# Patient Record
Sex: Male | Born: 1950 | Race: White | Hispanic: No | Marital: Married | Smoking: Former smoker
Health system: Southern US, Community
[De-identification: ages and names within clinical notes are randomized; demographics above are authoritative.]

## PROBLEM LIST (undated history)

## (undated) ENCOUNTER — Emergency Department: Admission: EM | Payer: Federal, State, Local not specified - PPO

## (undated) DIAGNOSIS — F329 Major depressive disorder, single episode, unspecified: Secondary | ICD-10-CM

## (undated) DIAGNOSIS — Z951 Presence of aortocoronary bypass graft: Secondary | ICD-10-CM

## (undated) DIAGNOSIS — E119 Type 2 diabetes mellitus without complications: Secondary | ICD-10-CM

## (undated) DIAGNOSIS — R319 Hematuria, unspecified: Secondary | ICD-10-CM

## (undated) DIAGNOSIS — E872 Acidosis, unspecified: Secondary | ICD-10-CM

## (undated) DIAGNOSIS — K922 Gastrointestinal hemorrhage, unspecified: Secondary | ICD-10-CM

## (undated) DIAGNOSIS — I429 Cardiomyopathy, unspecified: Secondary | ICD-10-CM

## (undated) DIAGNOSIS — R011 Cardiac murmur, unspecified: Secondary | ICD-10-CM

## (undated) DIAGNOSIS — N184 Chronic kidney disease, stage 4 (severe): Secondary | ICD-10-CM

## (undated) DIAGNOSIS — I5022 Chronic systolic (congestive) heart failure: Secondary | ICD-10-CM

## (undated) DIAGNOSIS — C113 Malignant neoplasm of anterior wall of nasopharynx: Secondary | ICD-10-CM

## (undated) DIAGNOSIS — D509 Iron deficiency anemia, unspecified: Secondary | ICD-10-CM

## (undated) DIAGNOSIS — I2510 Atherosclerotic heart disease of native coronary artery without angina pectoris: Secondary | ICD-10-CM

## (undated) DIAGNOSIS — U071 COVID-19: Secondary | ICD-10-CM

## (undated) DIAGNOSIS — R790 Abnormal level of blood mineral: Secondary | ICD-10-CM

## (undated) DIAGNOSIS — E2740 Unspecified adrenocortical insufficiency: Secondary | ICD-10-CM

## (undated) DIAGNOSIS — Z923 Personal history of irradiation: Secondary | ICD-10-CM

## (undated) DIAGNOSIS — I10 Essential (primary) hypertension: Secondary | ICD-10-CM

## (undated) DIAGNOSIS — K2970 Gastritis, unspecified, without bleeding: Secondary | ICD-10-CM

## (undated) DIAGNOSIS — I739 Peripheral vascular disease, unspecified: Secondary | ICD-10-CM

## (undated) DIAGNOSIS — N400 Enlarged prostate without lower urinary tract symptoms: Secondary | ICD-10-CM

## (undated) DIAGNOSIS — K254 Chronic or unspecified gastric ulcer with hemorrhage: Secondary | ICD-10-CM

## (undated) DIAGNOSIS — I519 Heart disease, unspecified: Secondary | ICD-10-CM

## (undated) DIAGNOSIS — R569 Unspecified convulsions: Secondary | ICD-10-CM

## (undated) DIAGNOSIS — I219 Acute myocardial infarction, unspecified: Secondary | ICD-10-CM

## (undated) DIAGNOSIS — K2960 Other gastritis without bleeding: Secondary | ICD-10-CM

## (undated) DIAGNOSIS — C4490 Unspecified malignant neoplasm of skin, unspecified: Secondary | ICD-10-CM

## (undated) DIAGNOSIS — I469 Cardiac arrest, cause unspecified: Secondary | ICD-10-CM

## (undated) DIAGNOSIS — D649 Anemia, unspecified: Secondary | ICD-10-CM

## (undated) DIAGNOSIS — N189 Chronic kidney disease, unspecified: Secondary | ICD-10-CM

## (undated) DIAGNOSIS — I779 Disorder of arteries and arterioles, unspecified: Secondary | ICD-10-CM

## (undated) DIAGNOSIS — T82218A Other mechanical complication of coronary artery bypass graft, initial encounter: Secondary | ICD-10-CM

## (undated) DIAGNOSIS — I4891 Unspecified atrial fibrillation: Secondary | ICD-10-CM

## (undated) DIAGNOSIS — I4819 Other persistent atrial fibrillation: Secondary | ICD-10-CM

## (undated) DIAGNOSIS — A491 Streptococcal infection, unspecified site: Secondary | ICD-10-CM

## (undated) DIAGNOSIS — K219 Gastro-esophageal reflux disease without esophagitis: Secondary | ICD-10-CM

## (undated) HISTORY — DX: Atherosclerotic heart disease of native coronary artery without angina pectoris: I25.10

## (undated) HISTORY — DX: Other mechanical complication of coronary artery bypass graft, initial encounter: T82.218A

## (undated) HISTORY — DX: Type 2 diabetes mellitus without complications: E11.9

## (undated) HISTORY — DX: Gastrointestinal hemorrhage, unspecified: K92.2

## (undated) HISTORY — DX: Gastro-esophageal reflux disease without esophagitis: K21.9

## (undated) HISTORY — DX: Unspecified malignant neoplasm of skin, unspecified: C44.90

## (undated) HISTORY — DX: Chronic or unspecified gastric ulcer with hemorrhage: K25.4

## (undated) HISTORY — DX: Malignant neoplasm of anterior wall of nasopharynx: C11.3

## (undated) HISTORY — DX: Heart disease, unspecified: I51.9

## (undated) HISTORY — DX: Gastritis, unspecified, without bleeding: K29.70

## (undated) HISTORY — DX: Iron deficiency anemia, unspecified: D50.9

## (undated) HISTORY — DX: Personal history of irradiation: Z92.3

## (undated) HISTORY — PX: REVISION OF AORTA BIFEMORAL BYPASS: SHX6317

## (undated) HISTORY — DX: Chronic kidney disease, unspecified: N18.9

## (undated) HISTORY — DX: Abnormal level of blood mineral: R79.0

## (undated) HISTORY — DX: Other gastritis without bleeding: K29.60

## (undated) HISTORY — DX: Major depressive disorder, single episode, unspecified: F32.9

---
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
## 1994-07-30 HISTORY — PX: CORONARY ARTERY BYPASS GRAFT: SHX141

## 1994-12-29 DIAGNOSIS — Z951 Presence of aortocoronary bypass graft: Secondary | ICD-10-CM

## 1994-12-29 HISTORY — DX: Presence of aortocoronary bypass graft: Z95.1

## 1998-06-29 HISTORY — PX: FEMORAL-POPLITEAL BYPASS GRAFT: SHX937

## 1998-06-29 HISTORY — PX: OTHER SURGICAL HISTORY: SHX169

## 2004-07-30 HISTORY — PX: CAROTID ENDARTERECTOMY: SUR193

## 2008-05-30 DIAGNOSIS — C113 Malignant neoplasm of anterior wall of nasopharynx: Secondary | ICD-10-CM

## 2008-05-30 HISTORY — DX: Malignant neoplasm of anterior wall of nasopharynx: C11.3

## 2009-07-30 HISTORY — PX: OTHER SURGICAL HISTORY: SHX169

## 2009-08-30 HISTORY — PX: OTHER SURGICAL HISTORY: SHX169

## 2010-10-29 HISTORY — PX: OTHER SURGICAL HISTORY: SHX169

## 2011-02-28 HISTORY — PX: OTHER SURGICAL HISTORY: SHX169

## 2015-07-31 DIAGNOSIS — I219 Acute myocardial infarction, unspecified: Secondary | ICD-10-CM

## 2015-07-31 HISTORY — DX: Acute myocardial infarction, unspecified: I21.9

## 2017-01-27 DIAGNOSIS — K2960 Other gastritis without bleeding: Secondary | ICD-10-CM

## 2017-01-27 HISTORY — DX: Other gastritis without bleeding: K29.60

## 2018-05-30 DIAGNOSIS — Z923 Personal history of irradiation: Secondary | ICD-10-CM

## 2018-05-30 HISTORY — DX: Personal history of irradiation: Z92.3

## 2018-07-16 LAB — HM DIABETES FOOT EXAM: HM Diabetic Foot Exam: POSITIVE

## 2018-07-21 DIAGNOSIS — I25810 Atherosclerosis of coronary artery bypass graft(s) without angina pectoris: Secondary | ICD-10-CM | POA: Insufficient documentation

## 2018-07-21 DIAGNOSIS — I70229 Atherosclerosis of native arteries of extremities with rest pain, unspecified extremity: Secondary | ICD-10-CM

## 2018-07-21 DIAGNOSIS — I70219 Atherosclerosis of native arteries of extremities with intermittent claudication, unspecified extremity: Secondary | ICD-10-CM | POA: Insufficient documentation

## 2018-07-21 DIAGNOSIS — I10 Essential (primary) hypertension: Secondary | ICD-10-CM | POA: Insufficient documentation

## 2018-07-21 DIAGNOSIS — I6523 Occlusion and stenosis of bilateral carotid arteries: Secondary | ICD-10-CM | POA: Insufficient documentation

## 2018-09-02 DIAGNOSIS — I5022 Chronic systolic (congestive) heart failure: Secondary | ICD-10-CM | POA: Insufficient documentation

## 2018-09-03 DIAGNOSIS — F329 Major depressive disorder, single episode, unspecified: Secondary | ICD-10-CM | POA: Insufficient documentation

## 2018-09-03 DIAGNOSIS — N189 Chronic kidney disease, unspecified: Secondary | ICD-10-CM | POA: Insufficient documentation

## 2018-09-03 DIAGNOSIS — K2970 Gastritis, unspecified, without bleeding: Secondary | ICD-10-CM

## 2018-09-03 DIAGNOSIS — N184 Chronic kidney disease, stage 4 (severe): Secondary | ICD-10-CM | POA: Insufficient documentation

## 2018-09-03 DIAGNOSIS — K922 Gastrointestinal hemorrhage, unspecified: Secondary | ICD-10-CM

## 2018-09-03 DIAGNOSIS — K219 Gastro-esophageal reflux disease without esophagitis: Secondary | ICD-10-CM

## 2018-09-03 DIAGNOSIS — Z8711 Personal history of peptic ulcer disease: Secondary | ICD-10-CM | POA: Insufficient documentation

## 2018-09-03 DIAGNOSIS — K254 Chronic or unspecified gastric ulcer with hemorrhage: Secondary | ICD-10-CM

## 2018-09-03 DIAGNOSIS — R790 Abnormal level of blood mineral: Secondary | ICD-10-CM

## 2018-09-03 DIAGNOSIS — F32A Depression, unspecified: Secondary | ICD-10-CM

## 2018-09-03 DIAGNOSIS — Z9221 Personal history of antineoplastic chemotherapy: Secondary | ICD-10-CM | POA: Insufficient documentation

## 2018-09-03 DIAGNOSIS — Z8719 Personal history of other diseases of the digestive system: Secondary | ICD-10-CM | POA: Insufficient documentation

## 2018-09-03 DIAGNOSIS — D509 Iron deficiency anemia, unspecified: Secondary | ICD-10-CM

## 2018-09-03 HISTORY — DX: Gastritis, unspecified, without bleeding: K29.70

## 2018-09-03 HISTORY — DX: Gastro-esophageal reflux disease without esophagitis: K21.9

## 2018-09-03 HISTORY — DX: Chronic or unspecified gastric ulcer with hemorrhage: K25.4

## 2018-09-03 HISTORY — DX: Iron deficiency anemia, unspecified: D50.9

## 2018-09-03 HISTORY — DX: Depression, unspecified: F32.A

## 2018-09-03 HISTORY — DX: Gastrointestinal hemorrhage, unspecified: K92.2

## 2018-09-04 DIAGNOSIS — K2960 Other gastritis without bleeding: Secondary | ICD-10-CM | POA: Insufficient documentation

## 2018-09-04 DIAGNOSIS — E118 Type 2 diabetes mellitus with unspecified complications: Secondary | ICD-10-CM

## 2018-09-04 DIAGNOSIS — K529 Noninfective gastroenteritis and colitis, unspecified: Secondary | ICD-10-CM | POA: Insufficient documentation

## 2018-09-08 ENCOUNTER — Encounter: Payer: Self-pay | Admitting: Family Medicine

## 2018-09-08 ENCOUNTER — Other Ambulatory Visit: Payer: Self-pay

## 2018-09-08 ENCOUNTER — Ambulatory Visit (INDEPENDENT_AMBULATORY_CARE_PROVIDER_SITE_OTHER): Payer: Medicare Other | Admitting: Family Medicine

## 2018-09-08 VITALS — BP 110/60 | HR 65 | Temp 97.7°F | Resp 16 | Ht 61.5 in | Wt 224.4 lb

## 2018-09-08 DIAGNOSIS — N184 Chronic kidney disease, stage 4 (severe): Secondary | ICD-10-CM | POA: Diagnosis not present

## 2018-09-08 DIAGNOSIS — I10 Essential (primary) hypertension: Secondary | ICD-10-CM | POA: Diagnosis not present

## 2018-09-08 DIAGNOSIS — D692 Other nonthrombocytopenic purpura: Secondary | ICD-10-CM

## 2018-09-08 DIAGNOSIS — K2960 Other gastritis without bleeding: Secondary | ICD-10-CM | POA: Diagnosis not present

## 2018-09-08 DIAGNOSIS — K219 Gastro-esophageal reflux disease without esophagitis: Secondary | ICD-10-CM

## 2018-09-08 DIAGNOSIS — E1122 Type 2 diabetes mellitus with diabetic chronic kidney disease: Secondary | ICD-10-CM

## 2018-09-08 DIAGNOSIS — Z1159 Encounter for screening for other viral diseases: Secondary | ICD-10-CM

## 2018-09-08 DIAGNOSIS — E785 Hyperlipidemia, unspecified: Secondary | ICD-10-CM

## 2018-09-08 DIAGNOSIS — Z794 Long term (current) use of insulin: Secondary | ICD-10-CM

## 2018-09-08 DIAGNOSIS — I5022 Chronic systolic (congestive) heart failure: Secondary | ICD-10-CM

## 2018-09-08 DIAGNOSIS — I70219 Atherosclerosis of native arteries of extremities with intermittent claudication, unspecified extremity: Secondary | ICD-10-CM

## 2018-09-08 DIAGNOSIS — I25118 Atherosclerotic heart disease of native coronary artery with other forms of angina pectoris: Secondary | ICD-10-CM

## 2018-09-08 DIAGNOSIS — K529 Noninfective gastroenteritis and colitis, unspecified: Secondary | ICD-10-CM

## 2018-09-08 LAB — HM HEPATITIS C SCREENING LAB: HM Hepatitis Screen: NEGATIVE
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
## 2018-09-09 LAB — COMPLETE METABOLIC PANEL WITH GFR
AG Ratio: 1.6 (calc) (ref 1.0–2.5)
ALT: 19 U/L (ref 9–46)
AST: 23 U/L (ref 10–35)
Albumin: 4.2 g/dL (ref 3.6–5.1)
Alkaline phosphatase (APISO): 55 U/L (ref 35–144)
BUN/Creatinine Ratio: 15 (calc) (ref 6–22)
BUN: 45 mg/dL — AB (ref 7–25)
CO2: 19 mmol/L — AB (ref 20–32)
CREATININE: 2.93 mg/dL — AB (ref 0.70–1.25)
Calcium: 9.2 mg/dL (ref 8.6–10.3)
Chloride: 110 mmol/L (ref 98–110)
GFR, Est African American: 25 mL/min/{1.73_m2} — ABNORMAL LOW (ref >=60)
GFR, Est Non African American: 21 mL/min/{1.73_m2} — ABNORMAL LOW (ref >=60)
GLUCOSE: 94 mg/dL (ref 65–99)
Globulin: 2.6 g/dL (calc) (ref 1.9–3.7)
Potassium: 4.8 mmol/L (ref 3.5–5.3)
Sodium: 140 mmol/L (ref 135–146)
Total Bilirubin: 0.5 mg/dL (ref 0.2–1.2)
Total Protein: 6.8 g/dL (ref 6.1–8.1)

## 2018-09-09 LAB — LIPID PANEL
Cholesterol: 120 mg/dL (ref <200)
HDL: 35 mg/dL — ABNORMAL LOW (ref >=40)
LDL Cholesterol (Calc): 64 mg/dL (calc)
NON-HDL CHOLESTEROL (CALC): 85 mg/dL (ref <130)
Total CHOL/HDL Ratio: 3.4 (calc) (ref <5.0)
Triglycerides: 128 mg/dL (ref <150)

## 2018-09-09 LAB — HEMOGLOBIN A1C
Hgb A1c MFr Bld: 6.3 % of total Hgb — ABNORMAL HIGH (ref <5.7)
Mean Plasma Glucose: 134 (calc)
eAG (mmol/L): 7.4 (calc)

## 2018-09-09 LAB — HEPATITIS C ANTIBODY
Hepatitis C Ab: NONREACTIVE
SIGNAL TO CUT-OFF: 0.33 (ref <1.00)

## 2018-09-09 NOTE — Addendum Note (Signed)
Addended by: Loistine Chance on: 09/09/2018 09:45 AM   Modules accepted: Level of Service

## 2018-09-10 ENCOUNTER — Encounter: Payer: Self-pay | Admitting: Family Medicine

## 2018-09-13 DIAGNOSIS — E782 Mixed hyperlipidemia: Secondary | ICD-10-CM | POA: Insufficient documentation

## 2018-09-13 DIAGNOSIS — E1122 Type 2 diabetes mellitus with diabetic chronic kidney disease: Secondary | ICD-10-CM | POA: Insufficient documentation

## 2018-09-13 DIAGNOSIS — Z794 Long term (current) use of insulin: Secondary | ICD-10-CM | POA: Insufficient documentation

## 2018-09-29 ENCOUNTER — Encounter (INDEPENDENT_AMBULATORY_CARE_PROVIDER_SITE_OTHER): Payer: Self-pay | Admitting: Vascular Surgery

## 2018-09-29 ENCOUNTER — Ambulatory Visit (INDEPENDENT_AMBULATORY_CARE_PROVIDER_SITE_OTHER): Payer: Medicare Other

## 2018-09-29 ENCOUNTER — Other Ambulatory Visit: Payer: Self-pay

## 2018-09-29 ENCOUNTER — Ambulatory Visit (INDEPENDENT_AMBULATORY_CARE_PROVIDER_SITE_OTHER): Payer: Medicare Other | Admitting: Vascular Surgery

## 2018-09-29 ENCOUNTER — Other Ambulatory Visit (INDEPENDENT_AMBULATORY_CARE_PROVIDER_SITE_OTHER): Payer: Self-pay | Admitting: Vascular Surgery

## 2018-09-29 VITALS — BP 148/65 | HR 56 | Resp 12 | Ht 72.0 in | Wt 226.0 lb

## 2018-09-29 DIAGNOSIS — I70219 Atherosclerosis of native arteries of extremities with intermittent claudication, unspecified extremity: Secondary | ICD-10-CM

## 2018-09-29 DIAGNOSIS — I10 Essential (primary) hypertension: Secondary | ICD-10-CM

## 2018-09-29 DIAGNOSIS — I6523 Occlusion and stenosis of bilateral carotid arteries: Secondary | ICD-10-CM | POA: Diagnosis not present

## 2018-09-29 DIAGNOSIS — Z79899 Other long term (current) drug therapy: Secondary | ICD-10-CM

## 2018-09-29 DIAGNOSIS — I25708 Atherosclerosis of coronary artery bypass graft(s), unspecified, with other forms of angina pectoris: Secondary | ICD-10-CM

## 2018-09-29 DIAGNOSIS — Z7902 Long term (current) use of antithrombotics/antiplatelets: Secondary | ICD-10-CM

## 2018-09-29 DIAGNOSIS — Z87891 Personal history of nicotine dependence: Secondary | ICD-10-CM

## 2018-09-29 DIAGNOSIS — E118 Type 2 diabetes mellitus with unspecified complications: Secondary | ICD-10-CM

## 2018-10-04 ENCOUNTER — Encounter (INDEPENDENT_AMBULATORY_CARE_PROVIDER_SITE_OTHER): Payer: Self-pay | Admitting: Vascular Surgery

## 2018-10-04 NOTE — Progress Notes (Signed)
MRN : [id number]  Dylan Goodman is a 68 y.o. (1951/02/24) male who presents with chief complaint of  Chief Complaint  Patient presents with  . New Patient (Initial Visit)  .  History of Present Illness:    The patient is seen for evaluation of painful lower extremities and diminished pulses. Patient notes the pain is always associated with activity and is very consistent day today. Typically, the pain occurs at less than one block, progress is as activity continues to the point that the patient must stop walking. Resting including standing still for several minutes allowed resumption of the activity and the ability to walk a similar distance before stopping again. Uneven terrain and inclined shorten the distance. The pain has been progressive over the past several years. The patient states the inability to walk is now having a profound negative impact on quality of life and daily activities.  He has had numerous past interventions and surgeries.  He is remotely s/p an aorta bi fem patency unknown.  He has had a right fem pop first in 06/1998 with two revisions in 11/2001 and 08/2003 currently thrombosed also a revision with intervention and stenting in 08/2013 (right groin subsequently became infected).  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  Left leg stent placed in 08/2009 now thrombosed  The patient denies rest pain or dangling of an extremity off the side of the bed during the night for relief. No open wounds or sores at this time.  The patient denies amaurosis fugax or recent TIA symptoms. There are no recent neurological changes noted.  Recent carotid duplex shows RICA 03% and LICA is occluded  The patient denies changes in claudication symptoms or new rest pain symptoms.  No new ulcers or wounds of the foot.  The patient's blood pressure has been stable and relatively well controlled.  The patient denies history of DVT, PE or superficial  thrombophlebitis. The patient denies recent episodes of angina or shortness of breath.   Current Meds  Medication Sig  . carvedilol (COREG) 6.25 MG tablet Take 1 tablet by mouth 2 (two) times daily.  . cilostazol (PLETAL) 100 MG tablet Take 100 mg by mouth daily.  . Continuous Blood Gluc Sensor (FREESTYLE LIBRE 14 DAY SENSOR) MISC APP 1 SENSOR EVERY 14 DAYS  . diphenoxylate-atropine (LOMOTIL) 2.5-0.025 MG tablet Take 1 tablet by mouth daily.   Marland Kitchen ELIQUIS 2.5 MG TABS tablet Take 2.5 mg by mouth 2 (two) times daily. Once in the am and once in the pm.  . ezetimibe (ZETIA) 10 MG tablet Take 10 mg by mouth daily.   . fenofibrate 160 MG tablet Take by mouth.  Marland Kitchen glipiZIDE (GLUCOTROL) 5 MG tablet Take 2.5 mg by mouth daily before breakfast.   . Insulin Degludec (TRESIBA FLEXTOUCH) 200 UNIT/ML SOPN Inject into the skin.  . Insulin Pen Needle (BD PEN NEEDLE NANO U/F) 32G X 4 MM MISC U ONCE D UTD  . ISOSORBIDE MONONITRATE PO Take 90 mg by mouth daily.   . lansoprazole (PREVACID) 30 MG capsule Take 30 mg by mouth daily at 12 noon.   Marland Kitchen losartan (COZAAR) 25 MG tablet TK 1 T PO QD  . lovastatin (MEVACOR) 40 MG tablet Take 40 mg by mouth. Once in the am and once in the pm.  . nitroGLYCERIN (NITROSTAT) 0.4 MG SL tablet DIS 1 T UNT PRF 30 DAYS. MAX 3 DOSES IN 15 MINUTES  . ticagrelor (BRILINTA) 60 MG TABS  tablet Take 60 mg by mouth 2 (two) times daily.     Past Medical History:  Diagnosis Date  . Bleeding gastrointestinal 09/03/2018  . 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 Chronic depression 09/03/2018  . CKD (chronic kidney disease) 09/03/2018  . Coronary atherosclerosis 09/03/2018  . Coronary bypass graft mechanical complication 90/2409  . Diabetes mellitus without complication ([place])   . Gastric ulcer with hemorrhage 09/03/2018  . Gastritis 09/03/2018  . GERD (gastroesophageal reflux disease) 09/03/2018  . Heart disease   . IDA (iron deficiency  anemia) 09/03/2018  . Low magnesium levels 09/03/2018  . 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 Personal history of radiation therapy 05/30/2018   39 treatments with chemotherapy nasopharyngeal cancer  . Skin cancer     Past Surgical History:  Procedure Laterality Date  . CORONARY ARTERY BYPASS GRAFT    . heart stent  10/2010  . 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  . left leg stents  08/2009  . REVISION OF AORTA BIFEMORAL BYPASS  08/2003  . right femo pop bypass  06/1998   11/2001, 08/2003  . right leg stent  07/2009    Social History Social History   Tobacco Use  . Smoking status: Former Smoker    Packs/day: 1.50    Years: 50.00    Pack years: 75.00    Types: Cigarettes    Last attempt to quit: 02/18/2017    Years since quitting: 1.6  . Smokeless tobacco: Never Used  Substance Use Topics  . Alcohol use: Not Currently  . Drug use: Never    Family History Family History  Problem Relation Age of Onset  . Coronary artery disease Mother   . Alcohol abuse Father   . Colon cancer Father   . Coronary artery disease Father   . Kidney cancer Father   . Breast cancer Sister   No family history of bleeding/clotting disorders, porphyria or autoimmune disease   No Known Allergies   REVIEW OF SYSTEMS (Negative unless checked)  Constitutional: [] Weight loss  [] Fever  [] Chills Cardiac: [x] Chest pain   [] Chest pressure   [] Palpitations   [] Shortness of breath when laying flat   [x] Shortness of breath with exertion. Vascular:  [x] Pain in legs with walking   [x] Pain in legs at rest  [] History of DVT   [] Phlebitis   [] Swelling in legs   [] Varicose veins   [] Non-healing ulcers Pulmonary:   [] Uses home oxygen   [] Productive cough   [] Hemoptysis   [] Wheeze  [x] COPD   [] Asthma Neurologic:  [] Dizziness   [] Seizures   [x] History of stroke   [] History of TIA  [] Aphasia   [] Vissual changes   [] Weakness or numbness in arm   [] Weakness or numbness in leg Musculoskeletal:   [] Joint swelling   [] Joint pain    [] Low back pain Hematologic:  [x] Easy bruising  [] Easy bleeding   [] Hypercoagulable state   [] Anemic Gastrointestinal:  [] Diarrhea   [] Vomiting  [] Gastroesophageal reflux/heartburn   [] Difficulty swallowing. Genitourinary:  [] Chronic kidney disease   [] Difficult urination  [] Frequent urination   [] Blood in urine Skin:  [] Rashes   [] Ulcers  Psychological:  [] History of anxiety   []  History of major depression.  Physical Examination  Vitals:   09/29/18 1359  BP: (!) 148/65  Pulse: (!) 56  Resp: 12  Weight: 226 lb (102.5 kg)  Height: 6' (1.829 m)   Body mass index is 30.65 kg/m. Gen: WD/WN, NAD Head: [place]/AT, No temporalis wasting.  Ear/Nose/Throat:  Hearing grossly intact, nares w/o erythema or drainage, poor dentition Eyes: PER, EOMI, sclera nonicteric.  Neck: Supple, no masses.  No bruit or JVD.  Pulmonary:  Good air movement, clear to auscultation bilaterally, no use of accessory muscles.  Cardiac: RRR, normal S1, S2, no Murmurs. Vascular:  Multiple incisional scars both legs and midline abdomen bilateral carotid bruits Vessel Right Left  Radial Palpable Palpable  Brachial Palpable Palpable  Carotid Palpable Palpable  PT Not Palpable Not Palpable  DP Not Palpable Not Palpable  Gastrointestinal: soft, non-distended. No guarding/no peritoneal signs.  Musculoskeletal: M/S 5/5 throughout.  No deformity or atrophy.  Neurologic: CN 2-12 intact. Pain and light touch intact in extremities.  Symmetrical.  Speech is fluent. Motor exam as listed above. Psychiatric: Judgment intact, Mood & affect appropriate for pt's clinical situation. Dermatologic: No rashes or ulcers noted.  No changes consistent with cellulitis. Lymph : No Cervical lymphadenopathy, no lichenification or skin changes of chronic lymphedema.  CBC No results found for: WBC, HGB, HCT, MCV, PLT  BMET    Component Value Date/Time   NA 140 09/08/2018 1203   K 4.8 09/08/2018 1203   CL 110 09/08/2018 1203   CO2 19 (L)  09/08/2018 1203   GLUCOSE 94 09/08/2018 1203   BUN 45 (H) 09/08/2018 1203   CREATININE 2.93 (H) 09/08/2018 1203   CALCIUM 9.2 09/08/2018 1203   GFRNONAA 21 (L) 09/08/2018 1203   GFRAA 25 (L) 09/08/2018 1203   CrCl cannot be calculated (Patient's most recent lab result is older than the maximum 21 days allowed.).  COAG No results found for: INR, PROTIME  Radiology Vas [place] Abi With/wo Tbi  Result Date: 09/29/2018 LOWER EXTREMITY DOPPLER STUDY Indications: Claudication.  Vascular Interventions: Patient states H/O aorto-bi-femoral BPG, right leg BPG                         and multiple stents. Performing Technologist: Blondell Reveal RT, RDMS, RVT  Examination Guidelines: A complete evaluation includes at minimum, Doppler waveform signals and systolic blood pressure reading at the level of bilateral brachial, anterior tibial, and posterior tibial arteries, when vessel segments are accessible. Bilateral testing is considered an integral part of a complete examination. Photoelectric Plethysmograph (PPG) waveforms and toe systolic pressure readings are included as required and additional duplex testing as needed. Limited examinations for reoccurring indications may be performed as noted.  ABI Findings: +---------+------------------+-----+----------+--------+ Right    Rt Pressure (mmHg)IndexWaveform  Comment  +---------+------------------+-----+----------+--------+ Brachial 135                                       +---------+------------------+-----+----------+--------+ CFA                             biphasic           +---------+------------------+-----+----------+--------+ ATA      76                0.54 monophasic         +---------+------------------+-----+----------+--------+ PTA      76                0.54 monophasic         +---------+------------------+-----+----------+--------+ Great Toe56                0.40 Abnormal            +---------+------------------+-----+----------+--------+ +---------+------------------+-----+----------+-------+  Left     Lt Pressure (mmHg)IndexWaveform  Comment +---------+------------------+-----+----------+-------+ Brachial 140                                      +---------+------------------+-----+----------+-------+ ATA      60                0.43 monophasic        +---------+------------------+-----+----------+-------+ PTA      54                0.39 monophasic        +---------+------------------+-----+----------+-------+ Great Toe48                0.34 Abnormal          +---------+------------------+-----+----------+-------+ LIMITED DUPLEX (based on limited surgical history): Dampened biphasic flow in the bilateral common femoral arteries. No flow was adequately detected in the bilateral superficial femoral or popliteal arteries with prominent collateral flow. No flow was detected in what appears to be a right leg femoral BPG  as well. No previous ABI available for comparison.  Summary: Right: Resting right ankle-brachial index indicates moderate right lower extremity arterial disease. The right toe-brachial index is abnormal. Left: Resting left ankle-brachial index indicates severe left lower extremity arterial disease. The left toe-brachial index is abnormal. Bilateral: Based on limited duplex, there appears to be extensive occlusive disease in the bilateral femoral-popliteal arterial systems, as described above.  *See table(s) above for measurements and observations.  Electronically signed by Hortencia Pilar MD on 09/29/2018 at 4:51:44 PM.   Final      Assessment/Plan 1. Atherosclerotic peripheral vascular disease with intermittent claudication (HCC) Recommend:  Patient should undergo arterial duplex of the lower extremity ASAP because there has been a significant deterioration in the patient's lower extremity symptoms.  The patient states they are having increased  pain and a marked decrease in the distance that they can walk.  The risks and benefits as well as the alternatives were discussed in detail with the patient.  All questions were answered.  Patient agrees to proceed and understands this could be a prelude to angiography and intervention.  The patient will follow up with me in the office to review the studies.  - VAS US AORTA/IVC/ILIACS; Future  2. Bilateral carotid artery stenosis Recommend:  Given the patient's asymptomatic subcritical stenosis no further invasive testing or surgery at this time.  Duplex ultrasound shows RICA 52% and LICA is occluded  Continue antiplatelet therapy as prescribed Continue management of CAD, HTN and Hyperlipidemia Healthy heart diet,  encouraged exercise at least 4 times per week Follow up in 6 months with duplex ultrasound and physical exam   3. 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  Continue statin as ordered and reviewed, no changes at this time  Nitrates PRN for chest pain   4. Benign essential HTN Continue antihypertensive medications as already ordered, these medications have been reviewed and there are no changes at this time.   5. Diabetes mellitus type 2 with complications ([place]) Continue hypoglycemic medications as already ordered, these medications have been reviewed and there are no changes at this time.  Hgb A1C to be monitored as already arranged by primary service     Hortencia Pilar, MD  10/04/2018 11:03 AM

## 2018-10-08 ENCOUNTER — Encounter (INDEPENDENT_AMBULATORY_CARE_PROVIDER_SITE_OTHER): Payer: Self-pay

## 2018-10-08 ENCOUNTER — Other Ambulatory Visit: Payer: Self-pay

## 2018-10-08 ENCOUNTER — Encounter (INDEPENDENT_AMBULATORY_CARE_PROVIDER_SITE_OTHER): Payer: Self-pay | Admitting: Nurse Practitioner

## 2018-10-08 ENCOUNTER — Telehealth (INDEPENDENT_AMBULATORY_CARE_PROVIDER_SITE_OTHER): Payer: Self-pay

## 2018-10-08 ENCOUNTER — Ambulatory Visit (INDEPENDENT_AMBULATORY_CARE_PROVIDER_SITE_OTHER): Payer: Medicare Other | Admitting: Nurse Practitioner

## 2018-10-08 ENCOUNTER — Ambulatory Visit (INDEPENDENT_AMBULATORY_CARE_PROVIDER_SITE_OTHER): Payer: Medicare Other

## 2018-10-08 VITALS — BP 132/65 | HR 64 | Resp 12 | Ht 72.0 in | Wt 223.0 lb

## 2018-10-08 DIAGNOSIS — Z7902 Long term (current) use of antithrombotics/antiplatelets: Secondary | ICD-10-CM

## 2018-10-08 DIAGNOSIS — I70212 Atherosclerosis of native arteries of extremities with intermittent claudication, left leg: Secondary | ICD-10-CM | POA: Diagnosis not present

## 2018-10-08 DIAGNOSIS — I70219 Atherosclerosis of native arteries of extremities with intermittent claudication, unspecified extremity: Secondary | ICD-10-CM

## 2018-10-08 DIAGNOSIS — E118 Type 2 diabetes mellitus with unspecified complications: Secondary | ICD-10-CM | POA: Diagnosis not present

## 2018-10-08 DIAGNOSIS — I25708 Atherosclerosis of coronary artery bypass graft(s), unspecified, with other forms of angina pectoris: Secondary | ICD-10-CM | POA: Diagnosis not present

## 2018-10-08 DIAGNOSIS — Z794 Long term (current) use of insulin: Secondary | ICD-10-CM

## 2018-10-08 DIAGNOSIS — K219 Gastro-esophageal reflux disease without esophagitis: Secondary | ICD-10-CM | POA: Diagnosis not present

## 2018-10-08 DIAGNOSIS — Z79899 Other long term (current) drug therapy: Secondary | ICD-10-CM

## 2018-10-08 DIAGNOSIS — Z87891 Personal history of nicotine dependence: Secondary | ICD-10-CM

## 2018-10-08 NOTE — Telephone Encounter (Signed)
Patient was seen in our office today and given his pre-procedure instructions for his angio with Dr. Delana Meyer on 11/11/2018 with an arrival time of 6:45 am, patient's pre-op is on 11/10/2018 @ 9:00 am. This information was given to the patient in office and I called the patient to give him his pre-op day and time.

## 2018-10-08 NOTE — Progress Notes (Signed)
SUBJECTIVE:  Patient ID: Dylan Goodman, male    DOB: January 30, 1951, 68 y.o.   MRN: [id number] No chief complaint on file.   HPI  Dylan Goodman is a 68 y.o. male The patient returns to the office for followup and review of the noninvasive studies. There has been a significant deterioration in the lower extremity symptoms.  The patient notes interval shortening of their claudication distance and development of mild rest pain symptoms. No new ulcers or wounds have occurred since the last visit.  There have been no significant changes to the patient's overall health care.  The patient denies amaurosis fugax or recent TIA symptoms. There are no recent neurological changes noted. The patient denies history of DVT, PE or superficial thrombophlebitis. The patient denies recent episodes of angina or shortness of breath.   The patient has an extensive history of peripheral arterial disease.  He notes having his first femoropopliteal bypass in 68.  He had a revision of his femoropopliteal in 2003 as well as in 2005.  He has had multiple stents placed within his lower extremities as well as her aortobifem.  He also had his aortobifem in 2005.  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  The right leg has predominantly monophasic throughout.  Past Medical History:  Diagnosis Date  . Bleeding gastrointestinal 09/03/2018  . 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 Chronic depression 09/03/2018  . CKD (chronic kidney disease) 09/03/2018  . Coronary atherosclerosis 09/03/2018  . Coronary bypass graft mechanical complication 83/1517  . Diabetes mellitus without complication ([place])   . Gastric ulcer with hemorrhage 09/03/2018  . Gastritis 09/03/2018   . GERD (gastroesophageal reflux disease) 09/03/2018  . Heart disease   . IDA (iron deficiency anemia) 09/03/2018  . Low magnesium levels 09/03/2018  . 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 Personal history of radiation therapy 05/30/2018   39 treatments with chemotherapy nasopharyngeal cancer  . Skin cancer     Past Surgical History:  Procedure Laterality Date  . CORONARY ARTERY BYPASS GRAFT    . heart stent  10/2010  . 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  . left leg stents  08/2009  . REVISION OF AORTA BIFEMORAL BYPASS  08/2003  . right femo pop bypass  06/1998   11/2001, 08/2003  . right leg stent  07/2009    Social History   Socioeconomic History  . Marital status: Married    Spouse name: Not on file  . Number of children: 1  . Years of education: Not on file  . Highest education level: 11th grade  Occupational History  . Occupation: retired   Scientific laboratory technician  . Financial resource strain: Not hard at all  . Food insecurity:    Worry: Never true    Inability: Never true  . Transportation needs:    Medical: No    Non-medical: No  Tobacco Use  . Smoking status: Former Smoker    Packs/day: 1.50    Years: 50.00    Pack years: 75.00    Types: Cigarettes    Last attempt to quit: 02/18/2017    Years since quitting: 1.6  . Smokeless tobacco: Never Used  Substance and Sexual Activity  . Alcohol use: Not Currently  . Drug use: Never  .  Sexual activity: Yes    Partners: Female  Lifestyle  . Physical activity:    Days per week: 0 days    Minutes per session: 0 min  . Stress: Not at all  Relationships  . Social connections:    Talks on phone: Twice a week    Gets together: Once a week    Attends religious service: Never    Active member of club or organization: No    Attends meetings of clubs or organizations: Never    Relationship status: Married  . Intimate partner violence:    Fear of current or ex partner: No    Emotionally abused: No    Physically abused: No    Forced  sexual activity: No  Other Topics Concern  . Not on file  Social History Narrative   Moved here from [place], in 2019, re-married 03/2018   Had one son from previous marriage, he died one day after Christmas, on MVA at age 44 yo    Family History  Problem Relation Age of Onset  . Coronary artery disease Mother   . Alcohol abuse Father   . Colon cancer Father   . Coronary artery disease Father   . Kidney cancer Father   . Breast cancer Sister     No Known Allergies   Review of Systems   Review of Systems: Negative Unless Checked Constitutional: [] Weight loss  [] Fever  [] Chills Cardiac: [] Chest pain   []  Atrial Fibrillation  [] Palpitations   [] Shortness of breath when laying flat   [] Shortness of breath with exertion. [] Shortness of breath at rest Vascular:  [x] Pain in legs with walking   [] Pain in legs with standing [] Pain in legs when laying flat   [x] Claudication    [] Pain in feet when laying flat    [] History of DVT   [] Phlebitis   [] Swelling in legs   [] Varicose veins   [] Non-healing ulcers Pulmonary:   [] Uses home oxygen   [] Productive cough   [] Hemoptysis   [] Wheeze  [] COPD   [] Asthma Neurologic:  [] Dizziness   [] Seizures  [] Blackouts [] History of stroke   [] History of TIA  [] Aphasia   [] Temporary Blindness   [] Weakness or numbness in arm   [] Weakness or numbness in leg Musculoskeletal:   [] Joint swelling   [] Joint pain   [] Low back pain  []  History of Knee Replacement [] Arthritis [] back Surgeries  []  Spinal Stenosis    Hematologic:  [] Easy bruising  [] Easy bleeding   [] Hypercoagulable state   [] Anemic Gastrointestinal:  [] Diarrhea   [] Vomiting  [x] Gastroesophageal reflux/heartburn   [] Difficulty swallowing. [] Abdominal pain Genitourinary:  [] Chronic kidney disease   [] Difficult urination  [] Anuric   [] Blood in urine [] Frequent urination  [] Burning with urination   [] Hematuria Skin:  [] Rashes   [] Ulcers [] Wounds Psychological:  [] History of anxiety   [x]  History of major depression   []  Memory Difficulties      OBJECTIVE:   Physical Exam  There were no vitals taken for this visit.  Gen: WD/WN, NAD Head: [place]/AT, No temporalis wasting.  Ear/Nose/Throat: Hearing grossly intact, nares w/o erythema or drainage Eyes: PER, EOMI, sclera nonicteric.  Neck: Supple, no masses.  No JVD.  Pulmonary:  Good air movement, no use of accessory muscles.  Cardiac: RRR Vascular:  Vessel Right Left  Radial Palpable Palpable  Dorsalis Pedis Not Palpable Not Palpable  Posterior Tibial Not Palpable Not Palpable   Gastrointestinal: soft, non-distended. No guarding/no peritoneal signs.  Musculoskeletal: M/S 5/5 throughout.  No deformity or  atrophy.  Neurologic: Pain and light touch intact in extremities.  Symmetrical.  Speech is fluent. Motor exam as listed above. Psychiatric: Judgment intact, Mood & affect appropriate for pt's clinical situation. Dermatologic: No Venous rashes. No Ulcers Noted.  No changes consistent with cellulitis. Lymph : No Cervical lymphadenopathy, no lichenification or skin changes of chronic lymphedema.       ASSESSMENT AND PLAN:  1. Atherosclerotic peripheral vascular disease with intermittent claudication (HCC) Recommend:  The patient has experienced increased symptoms and is now describing lifestyle limiting claudication and mild rest pain.   Given the severity of the patient's left  lower extremity symptoms the patient should undergo angiography and intervention.  Risk and benefits were reviewed the patient.  Indications for the procedure were reviewed.  All questions were answered, the patient agrees to proceed.   The patient should continue walking and begin a more formal exercise program.  The patient should continue antiplatelet therapy and aggressive treatment of the lipid abnormalities  The patient will follow up with me after the angiogram.   2. Gastroesophageal reflux disease without esophagitis Continue PPI as already ordered, this  medication has been reviewed and there are no changes at this time.  Avoidence of caffeine and alcohol  Moderate elevation of the head of the bed   3. Diabetes mellitus type 2 with complications ([place]) Continue hypoglycemic medications as already ordered, these medications have been reviewed and there are no changes at this time.  Hgb A1C to be monitored as already arranged by primary service   4. 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  Continue statin as ordered and reviewed, no changes at this time  Nitrates PRN for chest pain    Current Outpatient Medications on File Prior to Visit  Medication Sig Dispense Refill  . carvedilol (COREG) 6.25 MG tablet Take 1 tablet by mouth 2 (two) times daily.    . chlorhexidine (PERIDEX) 0.12 % solution     . cilostazol (PLETAL) 100 MG tablet Take 100 mg by mouth daily.    . Continuous Blood Gluc Sensor (FREESTYLE LIBRE 14 DAY SENSOR) MISC APP 1 SENSOR EVERY 14 DAYS    . diphenoxylate-atropine (LOMOTIL) 2.5-0.025 MG tablet Take 1 tablet by mouth daily.     Marland Kitchen ELIQUIS 2.5 MG TABS tablet Take 2.5 mg by mouth 2 (two) times daily. Once in the am and once in the pm.    . ezetimibe (ZETIA) 10 MG tablet Take 10 mg by mouth daily.     . fenofibrate 160 MG tablet Take by mouth.    Marland Kitchen glipiZIDE (GLUCOTROL) 5 MG tablet Take 2.5 mg by mouth daily before breakfast.     . Insulin Degludec (TRESIBA FLEXTOUCH) 200 UNIT/ML SOPN Inject into the skin.    . Insulin Pen Needle (BD PEN NEEDLE NANO U/F) 32G X 4 MM MISC U ONCE D UTD    . ISOSORBIDE MONONITRATE PO Take 90 mg by mouth daily.     . lansoprazole (PREVACID) 30 MG capsule Take 30 mg by mouth daily at 12 noon.     Marland Kitchen losartan (COZAAR) 25 MG tablet TK 1 T PO QD    . lovastatin (MEVACOR) 40 MG tablet Take 40 mg by mouth. Once in the am and once in the pm.     . nitroGLYCERIN (NITROSTAT) 0.4 MG SL tablet DIS 1 T UNT PRF  30 DAYS. MAX 3 DOSES IN 15 MINUTES    . ticagrelor (BRILINTA) 60 MG TABS tablet Take 60 mg by mouth 2 (two) times daily.      No current facility-administered medications on file prior to visit.     There are no Patient Instructions on file for this visit. No follow-ups on file.   Kris Hartmann, NP  This note was completed with Sales executive.  Any errors are purely unintentional.

## 2018-10-15 ENCOUNTER — Other Ambulatory Visit (INDEPENDENT_AMBULATORY_CARE_PROVIDER_SITE_OTHER): Payer: Self-pay | Admitting: Nurse Practitioner

## 2018-10-20 ENCOUNTER — Other Ambulatory Visit: Payer: Self-pay

## 2018-11-10 ENCOUNTER — Other Ambulatory Visit (INDEPENDENT_AMBULATORY_CARE_PROVIDER_SITE_OTHER): Payer: Self-pay | Admitting: Nurse Practitioner

## 2018-11-10 ENCOUNTER — Encounter
Admission: RE | Admit: 2018-11-10 | Discharge: 2018-11-10 | Disposition: A | Discharge status: Home / Self Care | Payer: Medicare Other | Source: Ambulatory Visit | Attending: Vascular Surgery | Admitting: Vascular Surgery

## 2018-11-10 ENCOUNTER — Other Ambulatory Visit: Payer: Self-pay

## 2018-11-10 DIAGNOSIS — Z87891 Personal history of nicotine dependence: Secondary | ICD-10-CM | POA: Diagnosis not present

## 2018-11-10 DIAGNOSIS — Z951 Presence of aortocoronary bypass graft: Secondary | ICD-10-CM | POA: Diagnosis not present

## 2018-11-10 DIAGNOSIS — K219 Gastro-esophageal reflux disease without esophagitis: Secondary | ICD-10-CM | POA: Diagnosis not present

## 2018-11-10 DIAGNOSIS — E785 Hyperlipidemia, unspecified: Secondary | ICD-10-CM | POA: Diagnosis not present

## 2018-11-10 DIAGNOSIS — I2510 Atherosclerotic heart disease of native coronary artery without angina pectoris: Secondary | ICD-10-CM | POA: Diagnosis not present

## 2018-11-10 DIAGNOSIS — E1122 Type 2 diabetes mellitus with diabetic chronic kidney disease: Secondary | ICD-10-CM | POA: Diagnosis not present

## 2018-11-10 DIAGNOSIS — Z95820 Peripheral vascular angioplasty status with implants and grafts: Secondary | ICD-10-CM | POA: Diagnosis not present

## 2018-11-10 DIAGNOSIS — E1151 Type 2 diabetes mellitus with diabetic peripheral angiopathy without gangrene: Secondary | ICD-10-CM | POA: Diagnosis not present

## 2018-11-10 DIAGNOSIS — Z01812 Encounter for preprocedural laboratory examination: Secondary | ICD-10-CM

## 2018-11-10 DIAGNOSIS — Z8249 Family history of ischemic heart disease and other diseases of the circulatory system: Secondary | ICD-10-CM | POA: Diagnosis not present

## 2018-11-10 DIAGNOSIS — I70223 Atherosclerosis of native arteries of extremities with rest pain, bilateral legs: Secondary | ICD-10-CM | POA: Diagnosis not present

## 2018-11-10 DIAGNOSIS — N189 Chronic kidney disease, unspecified: Secondary | ICD-10-CM | POA: Diagnosis not present

## 2018-11-10 DIAGNOSIS — I6523 Occlusion and stenosis of bilateral carotid arteries: Secondary | ICD-10-CM | POA: Diagnosis not present

## 2018-11-10 DIAGNOSIS — I252 Old myocardial infarction: Secondary | ICD-10-CM | POA: Diagnosis not present

## 2018-11-10 DIAGNOSIS — Z955 Presence of coronary angioplasty implant and graft: Secondary | ICD-10-CM | POA: Diagnosis not present

## 2018-11-10 DIAGNOSIS — I1310 Hypertensive heart and chronic kidney disease without heart failure, with stage 1 through stage 4 chronic kidney disease, or unspecified chronic kidney disease: Secondary | ICD-10-CM | POA: Diagnosis not present

## 2018-11-10 HISTORY — DX: Acute myocardial infarction, unspecified: I21.9

## 2018-11-10 HISTORY — DX: Cardiac murmur, unspecified: R01.1

## 2018-11-10 LAB — CREATININE, SERUM
Creatinine, Ser: 2.74 mg/dL — ABNORMAL HIGH (ref 0.61–1.24)
GFR calc Af Amer: 27 mL/min — ABNORMAL LOW (ref >60)
GFR calc non Af Amer: 23 mL/min — ABNORMAL LOW (ref >60)

## 2018-11-10 LAB — BUN: BUN: 49 mg/dL — ABNORMAL HIGH (ref 8–23)

## 2018-11-10 MED ORDER — CEFAZOLIN SODIUM-DEXTROSE 2-4 GM/100ML-% IV SOLN
2.0000 g | Freq: Once | INTRAVENOUS | Status: AC
  Administered 2018-11-11: 2 g via INTRAVENOUS

## 2018-11-10 NOTE — Pre-Procedure Instructions (Signed)
NM myocardial perfusion SPECT multiple (stress and rest)08/04/2018 [place] Result Impression   indeterminate Lexiscan infusion EKG due to baseline EKG changes Mild global LV systolic dysfunction ejection fraction of 40% Normal myocardial perfusion without evidence of myocardial ischemia  Dylan Goodman  Result Narrative  CARDIOLOGY DEPARTMENT [facility] [place], [place], [place] [address] [phone]  Procedure: Pharmacologic Myocardial Perfusion Imaging  ONE day procedure  Indication: Coronary artery disease of bypass graft of native heart with  stable angina pectoris (CMS-HCC) Plan: NM myocardial perfusion SPECT multiple (stress     and rest), ECG stress test only  Ordering Physician:   Dr. Serafina Goodman   Clinical History: 68 y.o. year old male Vitals: Height: 71 in Weight: 220 lb Cardiac risk factors include:   CAS, PVD, Hyperlipidemia, HTN, Obesity, CABG and CAD    Procedure:  Pharmacologic stress testing was performed with Regadenoson using a single  use 0.4mg /16ml (0.08 mg/ml) prefilled syringe intravenously infused as a  bolus dose. The stress test was stopped due to Infusion completion. Blood  pressure response was normal. The patient did not develop any symptoms  other than fatigue during the procedure.   Rest HR: 60bpm Rest BP: 130/65mmHg Max HR: 81bpm Min BP: 138/68mmHg  Stress Test Administered by: Oswald Hillock, CMA  ECG Interpretation: Rest ECG: normal sinus rhythm, none Stress ECG: normal sinus rhythm,  Recovery ECG: normal sinus rhythm ECG Interpretation: non-diagnostic due to pharmacologic testing.   Administrations This Visit   regadenoson (LEXISCAN) 0.4 mg/5 mL inj syringe 0.4 mg   Admin Date 08/01/2018 [place] Given Dose 0.4 mg Route Intravenous Administered By Herbert Seta, CNMT     technetium Tc38m sestamibi (CARDIOLITE) injection 31.12 millicurie   Admin Date 08/01/2018 Action Given Dose 16.24 millicurie Route Intravenous Administered By Herbert Seta, CNMT     technetium Tc38m sestamibi (CARDIOLITE) injection 4.69 millicurie   Admin Date 08/01/2018 Action Given Dose 5.07 millicurie Route Intravenous Administered By Herbert Seta, CNMT       Gated post-stress perfusion imaging was performed 30 minutes after stress.  Rest images were performed 30 minutes after injection.  Gated LV Analysis:  TID Ratio: 1.4  LVEF= 40%  FINDINGS: Regional wall motion: demonstrates hypokinesis of the [place] myocardium. The overall quality of the study is fair.  Artifacts noted: yes Left ventricular cavity: normal.  Perfusion Analysis: SPECT images demonstrate homogeneous tracer  distribution throughout the myocardium.   Status Results Details   Encounter Summary

## 2018-11-11 ENCOUNTER — Encounter: Payer: Self-pay | Admitting: *Deleted

## 2018-11-11 ENCOUNTER — Ambulatory Visit
Admission: RE | Admit: 2018-11-11 | Discharge: 2018-11-11 | Disposition: A | Discharge status: Home / Self Care | Payer: Medicare Other | Attending: Vascular Surgery | Admitting: Vascular Surgery

## 2018-11-11 ENCOUNTER — Other Ambulatory Visit: Payer: Self-pay

## 2018-11-11 ENCOUNTER — Encounter
Admission: RE | Disposition: A | Discharge status: Home / Self Care | Payer: Self-pay | Source: Home / Self Care | Attending: Vascular Surgery

## 2018-11-11 DIAGNOSIS — E785 Hyperlipidemia, unspecified: Secondary | ICD-10-CM | POA: Insufficient documentation

## 2018-11-11 DIAGNOSIS — N189 Chronic kidney disease, unspecified: Secondary | ICD-10-CM | POA: Insufficient documentation

## 2018-11-11 DIAGNOSIS — T82856A Stenosis of peripheral vascular stent, initial encounter: Secondary | ICD-10-CM

## 2018-11-11 DIAGNOSIS — I25709 Atherosclerosis of coronary artery bypass graft(s), unspecified, with unspecified angina pectoris: Secondary | ICD-10-CM | POA: Diagnosis not present

## 2018-11-11 DIAGNOSIS — E1151 Type 2 diabetes mellitus with diabetic peripheral angiopathy without gangrene: Secondary | ICD-10-CM | POA: Insufficient documentation

## 2018-11-11 DIAGNOSIS — Z87891 Personal history of nicotine dependence: Secondary | ICD-10-CM | POA: Insufficient documentation

## 2018-11-11 DIAGNOSIS — I2510 Atherosclerotic heart disease of native coronary artery without angina pectoris: Secondary | ICD-10-CM | POA: Insufficient documentation

## 2018-11-11 DIAGNOSIS — Z955 Presence of coronary angioplasty implant and graft: Secondary | ICD-10-CM | POA: Insufficient documentation

## 2018-11-11 DIAGNOSIS — I1310 Hypertensive heart and chronic kidney disease without heart failure, with stage 1 through stage 4 chronic kidney disease, or unspecified chronic kidney disease: Secondary | ICD-10-CM | POA: Insufficient documentation

## 2018-11-11 DIAGNOSIS — Z95820 Peripheral vascular angioplasty status with implants and grafts: Secondary | ICD-10-CM | POA: Insufficient documentation

## 2018-11-11 DIAGNOSIS — I6523 Occlusion and stenosis of bilateral carotid arteries: Secondary | ICD-10-CM | POA: Insufficient documentation

## 2018-11-11 DIAGNOSIS — I70223 Atherosclerosis of native arteries of extremities with rest pain, bilateral legs: Secondary | ICD-10-CM | POA: Insufficient documentation

## 2018-11-11 DIAGNOSIS — Z951 Presence of aortocoronary bypass graft: Secondary | ICD-10-CM | POA: Insufficient documentation

## 2018-11-11 DIAGNOSIS — I701 Atherosclerosis of renal artery: Secondary | ICD-10-CM

## 2018-11-11 DIAGNOSIS — Z8249 Family history of ischemic heart disease and other diseases of the circulatory system: Secondary | ICD-10-CM | POA: Insufficient documentation

## 2018-11-11 DIAGNOSIS — I10 Essential (primary) hypertension: Secondary | ICD-10-CM

## 2018-11-11 DIAGNOSIS — Z79899 Other long term (current) drug therapy: Secondary | ICD-10-CM

## 2018-11-11 DIAGNOSIS — I252 Old myocardial infarction: Secondary | ICD-10-CM | POA: Insufficient documentation

## 2018-11-11 DIAGNOSIS — E1122 Type 2 diabetes mellitus with diabetic chronic kidney disease: Secondary | ICD-10-CM | POA: Insufficient documentation

## 2018-11-11 DIAGNOSIS — I70219 Atherosclerosis of native arteries of extremities with intermittent claudication, unspecified extremity: Secondary | ICD-10-CM

## 2018-11-11 DIAGNOSIS — K219 Gastro-esophageal reflux disease without esophagitis: Secondary | ICD-10-CM | POA: Insufficient documentation

## 2018-11-11 HISTORY — PX: LOWER EXTREMITY ANGIOGRAPHY: CATH118251

## 2018-11-11 LAB — GLUCOSE, CAPILLARY
Glucose-Capillary: 105 mg/dL — ABNORMAL HIGH (ref 70–99)
Glucose-Capillary: 116 mg/dL — ABNORMAL HIGH (ref 70–99)

## 2018-11-11 SURGERY — LOWER EXTREMITY ANGIOGRAPHY
Anesthesia: Moderate Sedation | Site: Leg Lower | Laterality: Left

## 2018-11-11 MED ORDER — DIPHENHYDRAMINE HCL 50 MG/ML IJ SOLN: 50.0000 mg | Freq: Once | INTRAMUSCULAR | Status: DC | PRN

## 2018-11-11 MED ORDER — HEPARIN SODIUM (PORCINE) 1000 UNIT/ML IJ SOLN
INTRAMUSCULAR | Status: AC
  Filled 2018-11-11: qty 1

## 2018-11-11 MED ORDER — HEPARIN (PORCINE) IN NACL 1000-0.9 UT/500ML-% IV SOLN
INTRAVENOUS | Status: AC
  Filled 2018-11-11: qty 1000

## 2018-11-11 MED ORDER — HYDROMORPHONE HCL 1 MG/ML IJ SOLN: 1.0000 mg | Freq: Once | INTRAMUSCULAR | Status: DC | PRN

## 2018-11-11 MED ORDER — FAMOTIDINE 20 MG PO TABS: 40.0000 mg | ORAL_TABLET | Freq: Once | ORAL | Status: DC | PRN

## 2018-11-11 MED ORDER — MIDAZOLAM HCL 2 MG/2ML IJ SOLN
INTRAMUSCULAR | Status: DC | PRN
  Administered 2018-11-11: 1 mg via INTRAVENOUS
  Administered 2018-11-11: 2 mg via INTRAVENOUS

## 2018-11-11 MED ORDER — FENTANYL CITRATE (PF) 100 MCG/2ML IJ SOLN
INTRAMUSCULAR | Status: AC
  Filled 2018-11-11: qty 2

## 2018-11-11 MED ORDER — SODIUM CHLORIDE 0.9 % IV SOLN
INTRAVENOUS | Status: DC
  Administered 2018-11-11: 07:00:00 via INTRAVENOUS

## 2018-11-11 MED ORDER — METHYLPREDNISOLONE SODIUM SUCC 125 MG IJ SOLR: 125.0000 mg | Freq: Once | INTRAMUSCULAR | Status: DC | PRN

## 2018-11-11 MED ORDER — SODIUM CHLORIDE FLUSH 0.9 % IV SOLN
INTRAVENOUS | Status: AC
  Filled 2018-11-11: qty 60

## 2018-11-11 MED ORDER — MIDAZOLAM HCL 2 MG/ML PO SYRP: 8.0000 mg | ORAL_SOLUTION | Freq: Once | ORAL | Status: DC | PRN

## 2018-11-11 MED ORDER — IOHEXOL 300 MG/ML  SOLN
INTRAMUSCULAR | Status: DC | PRN
  Administered 2018-11-11: 10:00:00 140 mL via INTRA_ARTERIAL

## 2018-11-11 MED ORDER — SODIUM CHLORIDE 0.9 % IV BOLUS
250.0000 mL | Freq: Once | INTRAVENOUS | Status: AC
  Administered 2018-11-11: 250 mL via INTRAVENOUS

## 2018-11-11 MED ORDER — LIDOCAINE HCL (PF) 1 % IJ SOLN
INTRAMUSCULAR | Status: AC
  Filled 2018-11-11: qty 30

## 2018-11-11 MED ORDER — FENTANYL CITRATE (PF) 100 MCG/2ML IJ SOLN
INTRAMUSCULAR | Status: DC | PRN
  Administered 2018-11-11 (×2): 50 ug via INTRAVENOUS

## 2018-11-11 MED ORDER — ONDANSETRON HCL 4 MG/2ML IJ SOLN: 4.0000 mg | Freq: Four times a day (QID) | INTRAMUSCULAR | Status: DC | PRN

## 2018-11-11 MED ORDER — HEPARIN SODIUM (PORCINE) 1000 UNIT/ML IJ SOLN
INTRAMUSCULAR | Status: DC | PRN
  Administered 2018-11-11: 3000 [IU] via INTRAVENOUS

## 2018-11-11 MED ORDER — MIDAZOLAM HCL 5 MG/5ML IJ SOLN
INTRAMUSCULAR | Status: AC
  Filled 2018-11-11: qty 5

## 2018-11-11 SURGICAL SUPPLY — 16 items
CANNULA 5F STIFF (CANNULA) ×2 IMPLANT
CATH BEACON 5 .035 65 C2 TIP (CATHETERS) ×2 IMPLANT
CATH PIG 70CM (CATHETERS) ×2 IMPLANT
DEVICE CLOSURE MYNXGRIP 5F (Vascular Products) ×2 IMPLANT
DEVICE TORQUE .025-.038 (MISCELLANEOUS) ×2 IMPLANT
GLIDECATH 4FR STR (CATHETERS) ×2 IMPLANT
GLIDECATH ANGLED 4FR 120CM (CATHETERS) ×2 IMPLANT
GLIDEWIRE ADV .035X260CM (WIRE) ×2 IMPLANT
GUIDEWIRE SUPER STIFF .035X180 (WIRE) ×2 IMPLANT
PACK ANGIOGRAPHY (CUSTOM PROCEDURE TRAY) ×2 IMPLANT
SHEATH BRITE TIP 5FRX11 (SHEATH) ×2 IMPLANT
SYR MEDRAD MARK 7 150ML (SYRINGE) ×2 IMPLANT
TUBING CONTRAST HIGH PRESS 72 (TUBING) ×2 IMPLANT
WIRE AQUATRACK .035X260CM (WIRE) ×2 IMPLANT
WIRE J 3MM .035X145CM (WIRE) ×2 IMPLANT
WIRE MAGIC TORQUE 260C (WIRE) ×2 IMPLANT

## 2018-11-11 NOTE — H&P (Signed)
[facility] Admission History & Physical  MRN : [id number]  Dylan Goodman is a 68 y.o. ([birth year]) male who presents with chief complaint of No chief complaint on file. Marland Kitchen  History of Present Illness:   The patient is seen for evaluation of painful lower extremities and diminished pulses. Patient notes the pain is always associated with activity and is very consistent day today. Typically, the pain occurs at less than one block, progress is as activity continues to the point that the patient must stop walking. Resting including standing still for several minutes allowed resumption of the activity and the ability to walk a similar distance before stopping again. Uneven terrain and inclined shorten the distance. The pain has been progressive over the past several years. The patient states the inability to walk is now having a profound negative impact on quality of life and daily activities.  He has had numerous past interventions and surgeries.  He is remotely s/p an aorta bi fem patency unknown.  He has had a right fem pop first in 06/1998 with two revisions in 11/2001 and 08/2003 currently thrombosed also a revision with intervention and stenting in 08/2013 (right groin subsequently became infected).  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  Left leg stent placed in 08/2009 now thrombosed  The patient denies rest pain or dangling of an extremity off the side of the bed during the night for relief. No open wounds or sores at this time.  The patient denies amaurosis fugax or recent TIA symptoms. There are no recent neurological changes noted.  Recent carotid duplex shows RICA 92% and LICA is occluded  The patient denies changes in claudication symptoms or new rest pain symptoms.  No new ulcers or wounds of the foot.  The patient's blood pressure has been stable and relatively well controlled.  Current Facility-Administered Medications   Medication Dose Route Frequency Provider Last Rate Last Dose  . Heparin (Porcine) in NaCl 1000-0.9 UT/500ML-% SOLN           . lidocaine (PF) (XYLOCAINE) 1 % injection           . 0.9 %  sodium chloride infusion   Intravenous Continuous Kris Hartmann, NP 75 mL/hr at 11/11/18 0715    . ceFAZolin (ANCEF) IVPB 2g/100 mL premix  2 g Intravenous Once Kris Hartmann, NP      . diphenhydrAMINE (BENADRYL) injection 50 mg  50 mg Intravenous Once PRN Kris Hartmann, NP      . famotidine (PEPCID) tablet 40 mg  40 mg Oral Once PRN Kris Hartmann, NP      . HYDROmorphone (DILAUDID) injection 1 mg  1 mg Intravenous Once PRN Eulogio Ditch E, NP      . methylPREDNISolone sodium succinate (SOLU-MEDROL) 125 mg/2 mL injection 125 mg  125 mg Intravenous Once PRN Eulogio Ditch E, NP      . midazolam (VERSED) 2 MG/ML syrup 8 mg  8 mg Oral Once PRN Kris Hartmann, NP      . ondansetron (ZOFRAN) injection 4 mg  4 mg Intravenous Q6H PRN Kris Hartmann, NP        Past Medical History:  Diagnosis Date  . Bleeding gastrointestinal 09/03/2018  . 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 Chronic depression 09/03/2018  . CKD (chronic kidney disease) 09/03/2018  . Coronary atherosclerosis 09/03/2018  . Coronary bypass graft mechanical complication  12/1994  . Diabetes mellitus without complication ([place])   . Gastric ulcer with hemorrhage 09/03/2018  . Gastritis 09/03/2018  . GERD (gastroesophageal reflux disease) 09/03/2018  . Heart disease   . Heart murmur   . IDA (iron deficiency anemia) 09/03/2018  . Low magnesium levels 09/03/2018  . 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 Myocardial infarction ([place]) 2017  . Personal history of radiation therapy 05/30/2018   39 treatments with chemotherapy nasopharyngeal cancer  . 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 CAROTID ENDARTERECTOMY  2006  . CORONARY ARTERY  BYPASS GRAFT  1996   quadruple bypass  . heart stent  10/2010   x3  . 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 left leg stents  08/2009  . REVISION OF AORTA BIFEMORAL BYPASS    . right femo pop bypass  06/1998   11/2001, 08/2003  . right leg stent  07/2009    Social History Social History   Tobacco Use  . Smoking status: Former Smoker    Packs/day: 1.50    Years: 50.00    Pack years: 75.00    Types: Cigarettes    Last attempt to quit: 02/18/2017    Years since quitting: 1.7  . Smokeless tobacco: Never Used  Substance Use Topics  . Alcohol use: Not Currently  . Drug use: Never    Family History Family History  Problem Relation Age of Onset  . Coronary artery disease Mother   . Alcohol abuse Father   . Colon cancer Father   . Coronary artery disease Father   . Kidney cancer Father   . Breast cancer Sister   No family history of bleeding/clotting disorders, porphyria or autoimmune disease   No Known Allergies   REVIEW OF SYSTEMS (Negative unless checked)  Constitutional: [] Weight loss  [] Fever  [] Chills Cardiac: [] Chest pain   [] Chest pressure   [] Palpitations   [] Shortness of breath when laying flat   [] Shortness of breath at rest   [] Shortness of breath with exertion. Vascular:  [x] Pain in legs with walking   [x] Pain in legs at rest   [] Pain in legs when laying flat   [x] Claudication   [] Pain in feet when walking  [x] Pain in feet at rest  [] Pain in feet when laying flat   [] History of DVT   [] Phlebitis   [] Swelling in legs   [] Varicose veins   [] Non-healing ulcers Pulmonary:   [] Uses home oxygen   [] Productive cough   [] Hemoptysis   [] Wheeze  [] COPD   [] Asthma Neurologic:  [] Dizziness  [] Blackouts   [] Seizures   [] History of stroke   [] History of TIA  [] Aphasia   [] Temporary blindness   [] Dysphagia   [] Weakness or numbness in arms   [] Weakness or numbness in legs Musculoskeletal:  [] Arthritis   [] Joint swelling   [] Joint pain   [] Low back pain Hematologic:  [] Easy  bruising  [] Easy bleeding   [] Hypercoagulable state   [] Anemic  [] Hepatitis Gastrointestinal:  [] Blood in stool   [] Vomiting blood  [] Gastroesophageal reflux/heartburn   [] Difficulty swallowing. Genitourinary:  [] Chronic kidney disease   [] Difficult urination  [] Frequent urination  [] Burning with urination   [] Blood in urine Skin:  [] Rashes   [] Ulcers   [] Wounds Psychological:  [] History of anxiety   []  History of major depression.  Physical Examination  Vitals:   11/11/18 0712  BP: 137/77  Pulse: 63  Resp: 16  Temp: 97.6 F (36.4 C)  TempSrc: Oral  SpO2: 95%  Weight: 101.2 kg  Height: 6' (1.829 m)   Body mass index is 30.24 kg/m. Gen: WD/WN, NAD Head: [place]/AT, No temporalis wasting.  Ear/Nose/Throat: Hearing grossly intact, nares w/o erythema or drainage, oropharynx w/o Erythema/Exudate, Eyes: Sclera non-icteric, conjunctiva clear Neck: Supple, no nuchal rigidity.  No JVD.  Pulmonary:  Good air movement, no increased work of respiration or use of accessory muscles  Cardiac: RRR, normal S1, S2, no Murmurs, rubs or gallops. Vascular: multiple scars from many previous operations Vessel Right Left  Radial Palpable Palpable  Femoral Trace Palpable Trace Palpable  Popliteal Not Palpable Not Palpable  PT Not Palpable Not Palpable  DP Not Palpable Not Palpable   Gastrointestinal: soft, non-tender/non-distended. No guarding/reflex. No masses, surgical incisions, or scars. Musculoskeletal: M/S 5/5 throughout.  No deformity or atrophy.  2+ edema Neurologic: Sensation grossly intact in extremities.  Symmetrical.  Speech is fluent. Motor exam as listed above. Psychiatric: Judgment intact, Mood & affect appropriate for pt's clinical situation. Dermatologic: No rashes or ulcers noted.  No cellulitis or open wounds. Lymph : No Cervical, Axillary, or Inguinal lymphadenopathy.    CBC No results found for: WBC, HGB, HCT, MCV, PLT  BMET    Component Value Date/Time   NA 140 09/08/2018  1203   K 4.8 09/08/2018 1203   CL 110 09/08/2018 1203   CO2 19 (L) 09/08/2018 1203   GLUCOSE 94 09/08/2018 1203   BUN 49 (H) 11/10/2018 0933   CREATININE 2.74 (H) 11/10/2018 0933   CREATININE 2.93 (H) 09/08/2018 1203   CALCIUM 9.2 09/08/2018 1203   GFRNONAA 23 (L) 11/10/2018 0933   GFRNONAA 21 (L) 09/08/2018 1203   GFRAA 27 (L) 11/10/2018 0933   GFRAA 25 (L) 09/08/2018 1203   Estimated Creatinine Clearance: 33.1 mL/min (A) (by C-G formula based on SCr of 2.74 mg/dL (H)).  COAG No results found for: INR, PROTIME  Radiology No results found.    Assessment/Plan 1. Atherosclerotic peripheral vascular disease with intermittent claudication (HCC) Recommend:  The patient has evidence of severe atherosclerotic changes of both lower extremities with rest pain that is associated with preulcerative changes and impending tissue loss of the foot.  Left leg is worse than the right leg.  This represents a limb threatening ischemia and places the patient at the risk for limb loss.  Patient should undergo angiography of the left lower extremities with the hope for intervention for limb salvage.  The risks and benefits as well as the alternative therapies was discussed in detail with the patient.  All questions were answered.  Patient agrees to proceed with angiography.  The patient will follow up with me in the office after the procedure.      2. Bilateral carotid artery stenosis Recommend:  Given the patient's asymptomatic subcritical stenosis no further invasive testing or surgery at this time.  Duplex ultrasound shows RICA 12% and LICA is occluded  Continue antiplatelet therapy as prescribed Continue management of CAD, HTN and Hyperlipidemia Healthy heart diet,  encouraged exercise at least 4 times per week Follow up in 6 months with duplex ultrasound and physical exam   3. Coronary artery disease of bypass graft of native heart with stable angina pectoris (HCC) Continue  cardiac and antihypertensive medications as already ordered and reviewed, no changes at this time.  Continue statin as ordered and reviewed, no changes at this time  Nitrates PRN for chest pain   4. Benign essential HTN  Continue antihypertensive medications as already ordered, these medications have been reviewed and there are no changes at this time.   5. Diabetes mellitus type 2 with complications ([place]) Continue hypoglycemic medications as already ordered, these medications have been reviewed and there are no changes at this time.  Hgb A1C to be monitored as already arranged by primary service   Hortencia Pilar, MD  11/11/2018 7:51 AM

## 2018-11-11 NOTE — Op Note (Signed)
[facility]  Percutaneous Study/Intervention Procedural Note   Date of Surgery: 11/11/2018,10:15 AM  Surgeon:Esmirna Ravan, Dolores Lory   Pre-operative Diagnosis: Atherosclerotic occlusive disease bilateral lower extremities with rest pain; complication of vascular device status post multiple surgeries and interventions with multiple occlusions  Post-operative diagnosis:  Same  Procedure(s) Performed:  1.  Abdominal aortogram  2.  Bilateral lower extremity distal runoff  3.  Minx closure right femoral    Anesthesia: Conscious sedation was administered by the interventional radiology RN under my direct supervision. IV Versed plus fentanyl were utilized. Continuous ECG, pulse oximetry and blood pressure was monitored throughout the entire procedure.  Conscious sedation was administered for a total of 50 minutes.  Sheath: 5 French Pinnacle retrograde right groin  Contrast: 140 cc   Fluoroscopy Time: 16.3 minutes  Indications:  The patient presents to [facility] with rest pain bilateral lower extremities.  Pedal pulses are nonpalpable bilaterally suggesting critical atherosclerotic occlusive disease.  The risks and benefits as well as alternative therapies for lower extremity revascularization are reviewed with the patient all questions are answered the patient agrees to proceed.  The patient is therefore undergoing angiography with the hope for intervention for limb salvage.   Procedure:  Jamison Soward Burkeis a 68 y.o. male who was identified and appropriate procedural time out was performed.  The patient was then placed supine on the table and prepped and draped in the usual sterile fashion.  Ultrasound was used to evaluate the right groin.  The right distal portion of the graft was echolucent and pulsatile indicating it is patent .  An ultrasound image was acquired for the permanent record.  A micropuncture needle was used to access the right distal graft and  common femoral artery under direct ultrasound guidance.  The microwire was then advanced under fluoroscopic guidance without difficulty followed by the micro-sheath.  A 0.035 Amplatz wire was advanced without resistance and a 5Fr sheath was placed.    Pigtail catheter was then advanced to the level of T12 and AP projection of the aorta was obtained. Pigtail catheter was then repositioned to above the bifurcation and dry.RAO view of the pelvis was obtained. Stiff angled Glidewire and pigtail catheter was then used across the bifurcation and the catheter was positioned in the distal external iliac artery.  LAO of the left right and then because I have to work groin was then obtained. Wire was reintroduced and negotiated into the profunda and the catheter was advanced into the profunda. Distal runoff was then performed.  The catheter and wire were then removed and hand-injection of contrast through the sheath was used to perform distal runoff of the right lower extremity this included a steep RAO of the right groin and AP imaging down to the foot.  After review of the images the catheter was removed over wire and an right view of the groin was obtained.  Minx device was deployed without difficulty.   Findings:   Aortogram: The abdominal aorta is patent.  The visceral segment seems to be slightly enlarged.  Bilateral renal arteries are identified there is an accessory renal on the right as well.  Right renal arteries are widely patent.  Left renal artery demonstrates greater than 70% stenosis in its proximal segment.  There appears to be an end and aortobifemoral bypass which begins in the mid infrarenal aorta.  Previously placed vascular coils are noted in the location of the left common and internal iliac arteries as well as the  left external iliac artery.  There is no retrograde filling of the iliac system from either side.  Right Lower Extremity: The arterial anastomosis in the right common femoral  appears patent.  There appears to be a short balloon expandable stent at the origin of the profunda there is an associated 80% stenosis within this stent.  The profunda is otherwise widely patent distal deep femoral shows a patent superior stent.  There are extensive collaterals of the profunda ultimately filling the anterior tibial and the posterior tibial.  These 2 tibial vessels remain widely patent down to the foot filling the pedal arch.  Peroneal is poorly visualized.  Both of the anterior tibial and posterior tibial arteries are occluded at their origins.  There is nonvisualization of the popliteal and SFA in its entirety previously placed stents are occluded the trifurcation is also occluded.  Anterior tibial is the optimal runoff for distal bypass.  Left Lower Extremity: There is a greater than 90% narrowing between the bypass anastomosis and the profunda femoris.  There is no retrograde filling of the pelvis.  The profunda femoris is otherwise widely patent with extensive collaterals reconstituting the anterior tibial is the optimal bypass target.  Anterior tibial is widely patent all the way to the foot filling the pedal arch.  The posterior tibial and peroneal are also patent but do not appear as robust.  As with the right the entire SFA previously placed stents the entire popliteal and the previously placed stents and the trifurcation itself with a stent extending into the proximal posterior tibial are all occluded.  The anterior tibial again is the optimal runoff for distal bypass.   Disposition: Patient was taken to the recovery room in stable condition having tolerated the procedure well.  Belenda Cruise Cyndee Giammarco 11/11/2018,10:15 AM

## 2018-11-12 ENCOUNTER — Encounter: Payer: Self-pay | Admitting: Vascular Surgery

## 2018-11-21 ENCOUNTER — Other Ambulatory Visit (INDEPENDENT_AMBULATORY_CARE_PROVIDER_SITE_OTHER): Payer: Self-pay | Admitting: Vascular Surgery

## 2018-11-21 DIAGNOSIS — I70223 Atherosclerosis of native arteries of extremities with rest pain, bilateral legs: Secondary | ICD-10-CM

## 2018-11-24 ENCOUNTER — Encounter (INDEPENDENT_AMBULATORY_CARE_PROVIDER_SITE_OTHER): Payer: Self-pay | Admitting: Vascular Surgery

## 2018-11-24 ENCOUNTER — Ambulatory Visit (INDEPENDENT_AMBULATORY_CARE_PROVIDER_SITE_OTHER): Payer: Medicare Other

## 2018-11-24 ENCOUNTER — Other Ambulatory Visit: Payer: Self-pay

## 2018-11-24 ENCOUNTER — Ambulatory Visit (INDEPENDENT_AMBULATORY_CARE_PROVIDER_SITE_OTHER): Payer: Medicare Other | Admitting: Vascular Surgery

## 2018-11-24 ENCOUNTER — Other Ambulatory Visit (INDEPENDENT_AMBULATORY_CARE_PROVIDER_SITE_OTHER): Payer: Medicare Other

## 2018-11-24 VITALS — BP 148/71 | HR 59 | Resp 16 | Wt 224.8 lb

## 2018-11-24 DIAGNOSIS — I70223 Atherosclerosis of native arteries of extremities with rest pain, bilateral legs: Secondary | ICD-10-CM | POA: Diagnosis not present

## 2018-11-24 DIAGNOSIS — Z87891 Personal history of nicotine dependence: Secondary | ICD-10-CM

## 2018-11-24 DIAGNOSIS — I25708 Atherosclerosis of coronary artery bypass graft(s), unspecified, with other forms of angina pectoris: Secondary | ICD-10-CM

## 2018-11-24 DIAGNOSIS — K219 Gastro-esophageal reflux disease without esophagitis: Secondary | ICD-10-CM

## 2018-11-24 DIAGNOSIS — I6523 Occlusion and stenosis of bilateral carotid arteries: Secondary | ICD-10-CM

## 2018-11-24 DIAGNOSIS — I10 Essential (primary) hypertension: Secondary | ICD-10-CM

## 2018-11-24 DIAGNOSIS — Z7902 Long term (current) use of antithrombotics/antiplatelets: Secondary | ICD-10-CM

## 2018-11-24 DIAGNOSIS — Z79899 Other long term (current) drug therapy: Secondary | ICD-10-CM

## 2018-11-24 NOTE — Progress Notes (Signed)
MRN : [id number]  Dylan Goodman is a 68 y.o. (02-15-1951) male who presents with chief complaint of No chief complaint on file. Marland Kitchen  History of Present Illness:   The patient returns to the office for followup and review status post angiogram. No interval shortening of the patient's claudication distance or rest pain symptoms. Previous wounds have now healed.  No new ulcers or wounds have occurred since the last visit.  There have been no significant changes to the patient's overall health care.  The patient denies amaurosis fugax or recent TIA symptoms. There are no recent neurological changes noted. The patient denies history of DVT, PE or superficial thrombophlebitis. The patient denies recent episodes of angina or shortness of breath.     No outpatient medications have been marked as taking for the 11/24/18 encounter (Appointment) with Delana Meyer, Dolores Lory, MD.    Past Medical History:  Diagnosis Date  . Bleeding gastrointestinal 09/03/2018  . 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 Chronic depression 09/03/2018  . CKD (chronic kidney disease) 09/03/2018  . Coronary atherosclerosis 09/03/2018  . Coronary bypass graft mechanical complication 72/0947  . Diabetes mellitus without complication ([place])   . Gastric ulcer with hemorrhage 09/03/2018  . Gastritis 09/03/2018  . GERD (gastroesophageal reflux disease) 09/03/2018  . Heart disease   . Heart murmur   . IDA (iron deficiency anemia) 09/03/2018  . Low magnesium levels 09/03/2018  . 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 Myocardial infarction ([place]) 2017  . Personal history of radiation therapy 05/30/2018   39 treatments with chemotherapy nasopharyngeal cancer  . 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 CAROTID ENDARTERECTOMY  2006  . CORONARY ARTERY BYPASS GRAFT  1996   quadruple bypass  . heart stent  10/2010   x3  . 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 left leg stents  08/2009  . LOWER EXTREMITY ANGIOGRAPHY Left 11/11/2018   Procedure: LOWER EXTREMITY ANGIOGRAPHY;  Surgeon: Katha Cabal, MD;  Location: [place] [facility];  Service: Cardiovascular;  Laterality: Left;  . REVISION OF AORTA BIFEMORAL BYPASS    . right femo pop bypass  06/1998   11/2001, 08/2003  . right leg stent  07/2009    Social History Social History   Tobacco Use  . Smoking status: Former Smoker    Packs/day: 1.50    Years: 50.00    Pack years: 75.00    Types: Cigarettes    Last attempt to quit: 02/18/2017    Years since quitting: 1.7  . Smokeless tobacco: Never Used  Substance Use Topics  . Alcohol use: Not Currently  . Drug use: Never    Family History Family History  Problem Relation Age of Onset  . Coronary artery disease Mother   . Alcohol abuse Father   . Colon cancer Father   . Coronary artery disease Father   . Kidney cancer Father   . Breast cancer Sister     No Known Allergies   REVIEW OF SYSTEMS (Negative unless checked)  Constitutional: [] Weight loss  [] Fever  [] Chills Cardiac: [] Chest pain   [] Chest pressure   [] Palpitations   [] Shortness of breath when laying flat   [] Shortness of breath with exertion. Vascular:  [x] Pain in legs with walking   [] Pain in legs at rest  [] History of DVT   [] Phlebitis   [] Swelling in  legs   [] Varicose veins   [] Non-healing ulcers Pulmonary:   [] Uses home oxygen   [] Productive cough   [] Hemoptysis   [] Wheeze  [] COPD   [] Asthma Neurologic:  [] Dizziness   [] Seizures   [] History of stroke   [] History of TIA  [] Aphasia   [] Vissual changes   [] Weakness or numbness in arm   [] Weakness or numbness in leg Musculoskeletal:   [] Joint swelling   [] Joint pain   [] Low back pain Hematologic:  [] Easy bruising  [] Easy bleeding   [] Hypercoagulable state   [] Anemic Gastrointestinal:  [] Diarrhea   [] Vomiting  [] Gastroesophageal reflux/heartburn   [] Difficulty  swallowing. Genitourinary:  [] Chronic kidney disease   [] Difficult urination  [] Frequent urination   [] Blood in urine Skin:  [] Rashes   [] Ulcers  Psychological:  [] History of anxiety   []  History of major depression.  Physical Examination  There were no vitals filed for this visit. There is no height or weight on file to calculate BMI. Gen: WD/WN, NAD Head: [place]/AT, No temporalis wasting.  Ear/Nose/Throat: Hearing grossly intact, nares w/o erythema or drainage Eyes: PER, EOMI, sclera nonicteric.  Neck: Supple, no large masses.   Pulmonary:  Good air movement, no audible wheezing bilaterally, no use of accessory muscles.  Cardiac: RRR, no JVD Vascular:  Vessel Right Left  Radial Palpable Palpable  PT Not Palpable Not Palpable  DP Not Palpable Not Palpable  Gastrointestinal: Non-distended. No guarding/no peritoneal signs.  Musculoskeletal: M/S 5/5 throughout.  No deformity or atrophy.  Neurologic: CN 2-12 intact. Symmetrical.  Speech is fluent. Motor exam as listed above. Psychiatric: Judgment intact, Mood & affect appropriate for pt's clinical situation. Dermatologic: No rashes or ulcers noted.  No changes consistent with cellulitis. Lymph : No lichenification or skin changes of chronic lymphedema.  CBC No results found for: WBC, HGB, HCT, MCV, PLT  BMET    Component Value Date/Time   NA 140 09/08/2018 1203   K 4.8 09/08/2018 1203   CL 110 09/08/2018 1203   CO2 19 (L) 09/08/2018 1203   GLUCOSE 94 09/08/2018 1203   BUN 49 (H) 11/10/2018 0933   CREATININE 2.74 (H) 11/10/2018 0933   CREATININE 2.93 (H) 09/08/2018 1203   CALCIUM 9.2 09/08/2018 1203   GFRNONAA 23 (L) 11/10/2018 0933   GFRNONAA 21 (L) 09/08/2018 1203   GFRAA 27 (L) 11/10/2018 0933   GFRAA 25 (L) 09/08/2018 1203   Estimated Creatinine Clearance: 33.1 mL/min (A) (by C-G formula based on SCr of 2.74 mg/dL (H)).  COAG No results found for: INR, PROTIME  Radiology No results found.    Assessment/Plan 1.  Atherosclerosis of native artery of both lower extremities with rest pain ([place])  Recommend:  The patient has evidence of severe atherosclerotic changes of both lower extremities associated with lifestyle limiting claudication.  Angiography has been performed and the situation is not ideal for intervention.  Given this finding open surgical repair is discussed.   The risks and benefits as well as the alternative therapies was discussed in detail with the patient.  All questions were answered.  Patient will follow up in one month and will consider if he wishes to move forward with surgery.  The patient will follow up with me in the office after the procedure.    A total of 35 minutes was spent with this patient and greater than 50% was spent in counseling and coordination of care with the patient.  Discussion included the treatment options for vascular disease including indications for surgery and intervention.  Also discussed is the appropriate timing of treatment.  In addition medical therapy was discussed.  - VAS [place] LOWER EXTREMITY ARTERIAL DUPLEX; Future  2. Bilateral carotid artery stenosis Recommend:  Given the patient's asymptomatic subcritical stenosis no further invasive testing or surgery at this time.  Continue antiplatelet therapy as prescribed Continue management of CAD, HTN and Hyperlipidemia Healthy heart diet,  encouraged exercise at least 4 times per week Follow up in 6 months with duplex ultrasound and physical exam   3. Benign essential HTN Continue antihypertensive medications as already ordered, these medications have been reviewed and there are no changes at this time.   4. Coronary artery disease of bypass graft of native heart with stable angina pectoris ([place]) Continue cardiac and antihypertensive medications as already ordered and reviewed, no changes at this time.  Continue statin as ordered and reviewed, no changes at this time  Nitrates PRN for chest pain    5. Gastroesophageal reflux disease without esophagitis Continue PPI as already ordered, this medication has been reviewed and there are no changes at this time.  Avoidence of caffeine and alcohol  Moderate elevation of the head of the bed     Hortencia Pilar, MD  11/24/2018 10:46 AM

## 2019-01-15 ENCOUNTER — Ambulatory Visit (INDEPENDENT_AMBULATORY_CARE_PROVIDER_SITE_OTHER): Payer: Medicare Other | Admitting: Vascular Surgery

## 2019-01-15 ENCOUNTER — Encounter (INDEPENDENT_AMBULATORY_CARE_PROVIDER_SITE_OTHER): Payer: Medicare Other

## 2019-01-16 ENCOUNTER — Telehealth: Payer: Self-pay

## 2019-01-16 ENCOUNTER — Encounter: Payer: Self-pay | Admitting: Family Medicine

## 2019-01-16 ENCOUNTER — Other Ambulatory Visit: Payer: Self-pay

## 2019-01-16 ENCOUNTER — Ambulatory Visit (INDEPENDENT_AMBULATORY_CARE_PROVIDER_SITE_OTHER): Payer: Medicare Other | Admitting: Family Medicine

## 2019-01-16 VITALS — BP 128/68 | HR 68 | Temp 97.9°F | Resp 16 | Ht 72.0 in | Wt 223.7 lb

## 2019-01-16 DIAGNOSIS — N184 Chronic kidney disease, stage 4 (severe): Secondary | ICD-10-CM | POA: Diagnosis not present

## 2019-01-16 DIAGNOSIS — R05 Cough: Secondary | ICD-10-CM

## 2019-01-16 DIAGNOSIS — I5022 Chronic systolic (congestive) heart failure: Secondary | ICD-10-CM | POA: Diagnosis not present

## 2019-01-16 DIAGNOSIS — I25118 Atherosclerotic heart disease of native coronary artery with other forms of angina pectoris: Secondary | ICD-10-CM | POA: Diagnosis not present

## 2019-01-16 DIAGNOSIS — S61439S Puncture wound without foreign body of unspecified hand, sequela: Secondary | ICD-10-CM

## 2019-01-16 DIAGNOSIS — I10 Essential (primary) hypertension: Secondary | ICD-10-CM

## 2019-01-16 DIAGNOSIS — I6523 Occlusion and stenosis of bilateral carotid arteries: Secondary | ICD-10-CM

## 2019-01-16 DIAGNOSIS — Z87891 Personal history of nicotine dependence: Secondary | ICD-10-CM

## 2019-01-16 DIAGNOSIS — R053 Chronic cough: Secondary | ICD-10-CM

## 2019-01-16 DIAGNOSIS — Z85828 Personal history of other malignant neoplasm of skin: Secondary | ICD-10-CM

## 2019-01-16 DIAGNOSIS — I70219 Atherosclerosis of native arteries of extremities with intermittent claudication, unspecified extremity: Secondary | ICD-10-CM

## 2019-01-16 DIAGNOSIS — Z23 Encounter for immunization: Secondary | ICD-10-CM

## 2019-01-16 NOTE — Progress Notes (Signed)
Name: Dylan Goodman   MRN: [id number]    DOB: [birth year]   Date:01/16/2019       Progress Note  Subjective  Chief Complaint  Chief Complaint  Patient presents with  . Diarrhea    Dr. Burgess Amor, GI for symptoms and was not prescribed any medications . [place] Stool Type 7     HPI  Chronic Diarrhea: seeing GI at [facility] for chronic diarrhea, taking medication but still having symptoms , advised to add Imodium if needed, still waiting for further testing.   Wound on left hand: he was working in his yard, placing a fence around tomato plants and injured his left hand , still has a scab, needs to have Tdap today.   CHF/CAD: he is on beta blocker, ARB, has some SOB with mild activity ( like walking and washing dishes) , Brilinta . He denies side effects of medication. He denies chest pain, on Imdur and sees Dr, Nehemiah Massed   Claudication: he brought a form to fill out of handicap placard, he has difficulty walking further than 200 feet.   DMII: he is under the care of endo, he states last A1C was 6.2%, glucose has been dropping in am, and is titrating dose down of Tresiba  CKI stage IV: seeing Dr. Abigail Butts, states had labs done recently no pruritis  History of skin cancer: he used to see dermatologist in [place] he has noticed two new lesions on his head, we will place referral to dermatologist locally  History of heavy smoking: 2 packs day for about 30 years and one pack for about 10 years, quit 2 years, ago, he has a morning cough that is productive at time, willing to try lung cancer screen     Patient Active Problem List   Diagnosis Date Noted  . Type 2 diabetes mellitus with stage 4 chronic kidney disease, with long-term current use of insulin ([place]) 09/13/2018  . Hyperlipidemia, mixed 09/13/2018  . Senile purpura ([place]) 09/08/2018  . Morbid obesity ([place]) 09/08/2018  . Chronic diarrhea of unknown origin 09/04/2018  . Menetrier's disease (hyperplastic hypersecretory gastropathy)  09/04/2018  . Chronic depression 09/03/2018  . Chronic kidney disease 09/03/2018  . History of gastric ulcer 09/03/2018  . Gastritis 09/03/2018  . GERD (gastroesophageal reflux disease) 09/03/2018  . IDA (iron deficiency anemia) 09/03/2018  . Low magnesium levels 09/03/2018  . History of cancer chemotherapy 09/03/2018  . Chronic systolic CHF (congestive heart failure), NYHA class 2 ([place]) 09/02/2018  . Atherosclerosis of native arteries of extremity with rest pain ([place]) 07/21/2018  . Benign essential HTN 07/21/2018  . Bilateral carotid artery stenosis 07/21/2018  . 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 CAROTID ENDARTERECTOMY  2006  . CORONARY ARTERY BYPASS GRAFT  1996   quadruple bypass  . heart stent  10/2010   x3  . 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 left leg stents  08/2009  . LOWER EXTREMITY ANGIOGRAPHY Left 11/11/2018   Procedure: LOWER EXTREMITY ANGIOGRAPHY;  Surgeon: Katha Cabal, MD;  Location: [facility];  Service: Cardiovascular;  Laterality: Left;  . REVISION OF AORTA BIFEMORAL BYPASS    . right femo pop bypass  06/1998   11/2001, 08/2003  . right leg stent  07/2009    Family History  Problem Relation Age of Onset  . Coronary artery disease Mother   . Alcohol abuse  Father   . Colon cancer Father   . Coronary artery disease Father   . Kidney cancer Father   . Breast cancer Sister     Social History   Socioeconomic History  . Marital status: Married    Spouse name: Not on file  . Number of children: 1  . Years of education: Not on file  . Highest education level: 11th grade  Occupational History  . Occupation: retired   Scientific laboratory technician  . Financial resource strain: Not hard at all  . Food insecurity    Worry: Never true    Inability: Never true  . Transportation needs    Medical: No    Non-medical: No  Tobacco Use  . Smoking  status: Former Smoker    Packs/day: 1.50    Years: 50.00    Pack years: 75.00    Types: Cigarettes    Quit date: 02/18/2017    Years since quitting: 1.9  . Smokeless tobacco: Never Used  Substance and Sexual Activity  . Alcohol use: Not Currently  . Drug use: Never  . Sexual activity: Yes    Partners: Female  Lifestyle  . Physical activity    Days per week: 0 days    Minutes per session: 0 min  . Stress: Not at all  Relationships  . Social Herbalist on phone: Twice a week    Gets together: Once a week    Attends religious service: Never    Active member of club or organization: No    Attends meetings of clubs or organizations: Never    Relationship status: Married  . Intimate partner violence    Fear of current or ex partner: No    Emotionally abused: No    Physically abused: No    Forced sexual activity: No  Other Topics Concern  . Not on file  Social History Narrative   Moved here from [place], in 2019, re-married 03/2018   Had one son from previous marriage, he died one day after Christmas, on MVA at age 85 yo     Current Outpatient Medications:  .  carvedilol (COREG) 6.25 MG tablet, Take 6.25 mg by mouth 2 (two) times daily. , Disp: , Rfl:  .  cilostazol (PLETAL) 100 MG tablet, Take 100 mg by mouth 2 (two) times daily. , Disp: , Rfl:  .  Continuous Blood Gluc Sensor (FREESTYLE LIBRE 14 DAY SENSOR) MISC, APP 1 SENSOR EVERY 14 DAYS, Disp: , Rfl:  .  Continuous Blood Gluc Sensor (FREESTYLE LIBRE 14 DAY SENSOR) MISC, Use 1 each every 14 (fourteen) days, Disp: , Rfl:  .  diphenoxylate-atropine (LOMOTIL) 2.5-0.025 MG tablet, Take 1 tablet by mouth daily. , Disp: , Rfl:  .  ELIQUIS 2.5 MG TABS tablet, Take 2.5 mg by mouth 2 (two) times daily. , Disp: , Rfl:  .  ezetimibe (ZETIA) 10 MG tablet, Take 10 mg by mouth daily. , Disp: , Rfl:  .  fenofibrate 160 MG tablet, Take 160 mg by mouth daily. , Disp: , Rfl:  .  Insulin Degludec (TRESIBA FLEXTOUCH) 200 UNIT/ML SOPN,  Inject 14 Units into the skin at bedtime. , Disp: , Rfl:  .  Insulin Pen Needle (BD PEN NEEDLE NANO U/F) 32G X 4 MM MISC, U ONCE D UTD, Disp: , Rfl:  .  isosorbide mononitrate (IMDUR) 30 MG 24 hr tablet, Take 30 mg by mouth daily., Disp: , Rfl:  .  isosorbide mononitrate (IMDUR) 60 MG 24  hr tablet, Take 60 mg by mouth daily., Disp: , Rfl:  .  lansoprazole (PREVACID) 30 MG capsule, Take 30 mg by mouth every morning. , Disp: , Rfl:  .  losartan (COZAAR) 25 MG tablet, Take 25 mg by mouth daily. , Disp: , Rfl:  .  lovastatin (MEVACOR) 40 MG tablet, Take 40 mg by mouth 2 (two) times daily. , Disp: , Rfl:  .  nitroGLYCERIN (NITROSTAT) 0.4 MG SL tablet, Place 0.4 mg under the tongue every 5 (five) minutes as needed for chest pain. , Disp: , Rfl:  .  sodium bicarbonate 650 MG tablet, Take 1 tablet by mouth 2 (two) times a day., Disp: , Rfl:  .  ticagrelor (BRILINTA) 60 MG TABS tablet, Take 60 mg by mouth 2 (two) times daily. , Disp: , Rfl:   No Known Allergies  I personally reviewed active problem list, medication list, allergies, family history, social history with the patient/caregiver today.   ROS  Constitutional: Negative for fever or weight change.  Respiratory: positive for chronic cough at times productive,  positive for  shortness of breath with mild activity .   Cardiovascular: Negative for chest pain or palpitations.  Gastrointestinal: Negative for abdominal pain, no bowel changes.  Musculoskeletal: Negative for gait problem or joint swelling.  Skin: Negative for rash.  Neurological: Negative for dizziness or headache.  No other specific complaints in a complete review of systems (except as listed in HPI above).  Objective  Vitals:   01/16/19 1138  BP: 128/68  Pulse: 68  Resp: 16  Temp: 97.9 F (36.6 C)  TempSrc: Oral  SpO2: 95%  Weight: 223 lb 11.2 oz (101.5 kg)  Height: 6' (1.829 m)    Body mass index is 30.34 kg/m.  Physical Exam  Constitutional: Patient appears  well-developed and well-nourished. Obese No distress.  HEENT: head atraumatic, normocephalic, pupils equal and reactive to light, neck supple Cardiovascular: Normal rate, regular rhythm and normal heart sounds.  No murmur heard. No BLE edema. Pulmonary/Chest: Effort normal and breath sounds normal. No respiratory distress. Abdominal: Soft.  There is no tenderness. Skin: lesions on top of his head Psychiatric: Patient has a normal mood and affect. behavior is normal. Judgment and thought content normal.  Recent Results (from the past 2160 hour(s))  BUN     Status: Abnormal   Collection Time: 11/10/18  9:33 AM  Result Value Ref Range   BUN 49 (H) 8 - 23 mg/dL    Comment: Performed at [facility], [address] [place], [place] [address]  Creatinine, serum     Status: Abnormal   Collection Time: 11/10/18  9:33 AM  Result Value Ref Range   Creatinine, Ser 2.74 (H) 0.61 - 1.24 mg/dL   GFR calc non Af Amer 23 (L) >60 mL/min   GFR calc Af Amer 27 (L) >60 mL/min    Comment: Performed at [facility], [address] [place], [place] [address]  Glucose, capillary     Status: Abnormal   Collection Time: 11/11/18  6:49 AM  Result Value Ref Range   Glucose-Capillary 105 (H) 70 - 99 mg/dL  Glucose, capillary     Status: Abnormal   Collection Time: 11/11/18 10:07 AM  Result Value Ref Range   Glucose-Capillary 116 (H) 70 - 99 mg/dL     PHQ2/9: Depression screen [facility] 2/9 01/16/2019 09/08/2018  Decreased Interest 0 0  Down, Depressed, Hopeless 0 0  PHQ - 2 Score 0 0  Altered sleeping  0 -  Tired, decreased energy 0 -  Change in appetite 0 -  Feeling bad or failure about yourself  0 -  Trouble concentrating 0 -  Moving slowly or fidgety/restless 0 -  Suicidal thoughts 0 -  PHQ-9 Score 0 -  Difficult doing work/chores Not difficult at all -    phq 9 is negative   Fall Risk: Fall Risk  01/16/2019 09/08/2018  Falls in the past year? 0 0  Number falls in past yr: 0 0   Injury with Fall? 0 0    Functional Status Survey: Is the patient deaf or have difficulty hearing?: No Does the patient have difficulty seeing, even when wearing glasses/contacts?: No Does the patient have difficulty concentrating, remembering, or making decisions?: No Does the patient have difficulty walking or climbing stairs?: No Does the patient have difficulty dressing or bathing?: No Does the patient have difficulty doing errands alone such as visiting a doctor's office or shopping?: No    Assessment & Plan  1. Puncture wound of hand without foreign body, unspecified laterality, sequela  - Tdap vaccine greater than or equal to 7yo IM  2. Benign essential HTN  At goal   3. Chronic kidney disease, stage IV (severe) (HCC)  Keep follow up with Dr. Abigail Butts   4. Chronic systolic CHF (congestive heart failure), NYHA class 2 ([place])  Keep follow up with Dr. Scarlette Ar, last EF 40% on myoview done 07/2018  5. Coronary artery disease of native heart with stable angina pectoris, unspecified vessel or lesion type ([place])  On Imdur and doing well   6. Atherosclerotic peripheral vascular disease with intermittent claudication (HCC)  Handicap placard form filled   7. History of skin cancer  - Ambulatory referral to Dermatology  8. History of tobacco use  - CT CHEST LUNG CA SCREEN LOW DOSE W/O CM; Future  9. Chronic cough  - CT CHEST LUNG CA SCREEN LOW DOSE W/O CM; Future

## 2019-01-16 NOTE — Telephone Encounter (Signed)
Copied from [place] [id number]. Topic: General - Inquiry >> Jan 16, 2019 10:48 AM Mathis Bud wrote: Reason for CRM: patient is requesting handicapped parking form when patient comes in today for appt with PCP

## 2019-01-16 NOTE — Telephone Encounter (Signed)
Copied from [place] [id number]. Topic: General - Inquiry >> Jan 16, 2019 10:48 AM Mathis Bud wrote: Reason for CRM: patient is requesting handicapped parking form when patient comes in today for appt with PCP

## 2019-01-19 ENCOUNTER — Telehealth: Payer: Self-pay | Admitting: *Deleted

## 2019-01-19 DIAGNOSIS — Z122 Encounter for screening for malignant neoplasm of respiratory organs: Secondary | ICD-10-CM

## 2019-01-19 DIAGNOSIS — Z87891 Personal history of nicotine dependence: Secondary | ICD-10-CM

## 2019-01-19 NOTE — Telephone Encounter (Signed)
Received referral for initial lung cancer screening scan. Contacted patient and obtained smoking history,(former, quit 02/18/17, 75 pack year) as well as answering questions related to screening process. Patient denies signs of lung cancer such as weight loss or hemoptysis. Patient denies comorbidity that would prevent curative treatment if lung cancer were found. Patient is scheduled for shared decision making visit and CT scan on 01/27/19 at 1145am.

## 2019-01-27 ENCOUNTER — Ambulatory Visit
Admission: RE | Admit: 2019-01-27 | Discharge: 2019-01-27 | Disposition: A | Discharge status: Home / Self Care | Payer: Medicare Other | Source: Ambulatory Visit | Attending: Nurse Practitioner | Admitting: Nurse Practitioner

## 2019-01-27 ENCOUNTER — Other Ambulatory Visit: Payer: Self-pay

## 2019-01-27 ENCOUNTER — Inpatient Hospital Stay: Payer: Medicare Other | Attending: Nurse Practitioner | Admitting: Nurse Practitioner

## 2019-01-27 DIAGNOSIS — Z87891 Personal history of nicotine dependence: Secondary | ICD-10-CM | POA: Diagnosis present

## 2019-01-27 DIAGNOSIS — Z1379 Encounter for other screening for genetic and chromosomal anomalies: Secondary | ICD-10-CM | POA: Diagnosis not present

## 2019-01-27 DIAGNOSIS — Z122 Encounter for screening for malignant neoplasm of respiratory organs: Secondary | ICD-10-CM | POA: Insufficient documentation

## 2019-01-27 IMAGING — CT CT CHEST LUNG CANCER SCREENING LOW DOSE
2 of 5 series · 15 of 40 positions shown, 18 images · non-contrast
Comparison: None.

CLINICAL DATA: Seventy-five pack-year smoking history. Quit 2 years
ago. Asymptomatic.

EXAM:
CT CHEST WITHOUT CONTRAST LOW-DOSE FOR LUNG CANCER SCREENING
TECHNIQUE: Multidetector CT imaging of the chest was performed following the
standard protocol without IV contrast.

[Series 4: lung · axial · 0.70mm/px · z∈[-1210,-899]mm · 12 of 343 slices shown, 15 images]
[im 16/343  mediastinal]
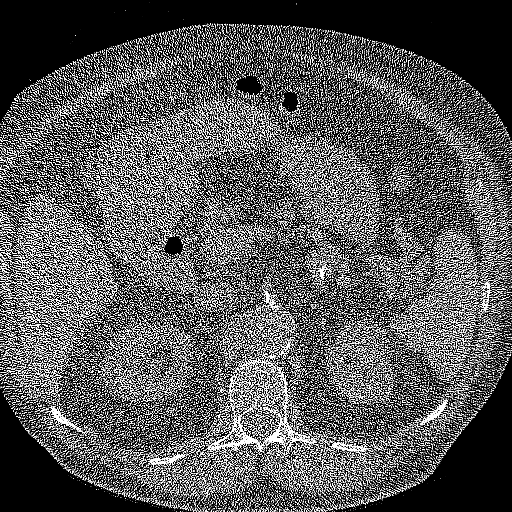
[im 16/343  lung]
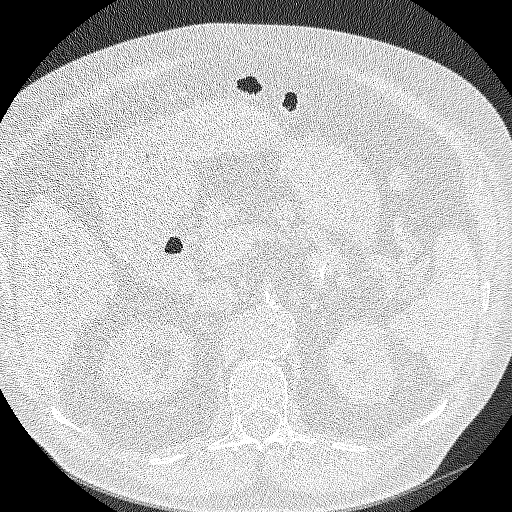
[im 47/343  lung]
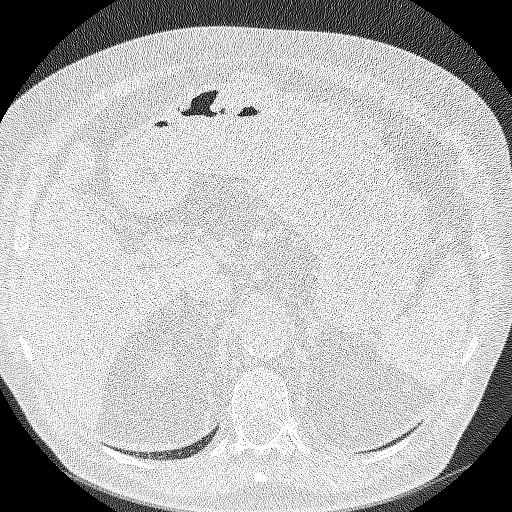
[im 78/343  lung]
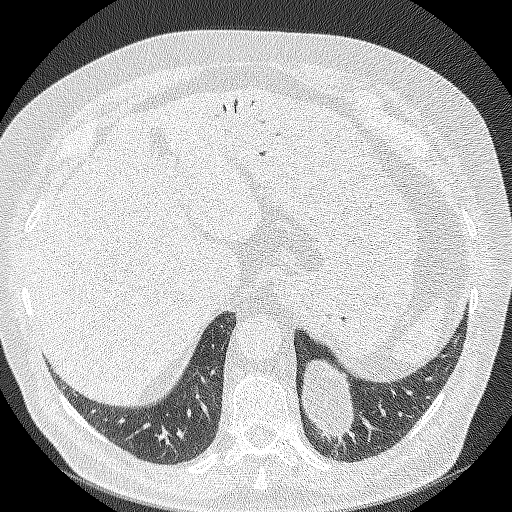
[im 109/343  lung]
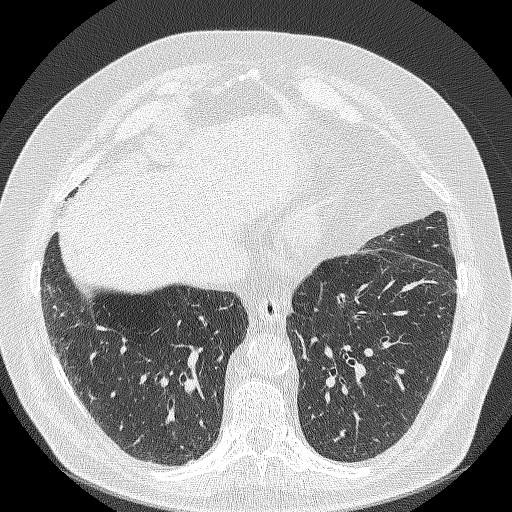
[im 125/343  mediastinal]
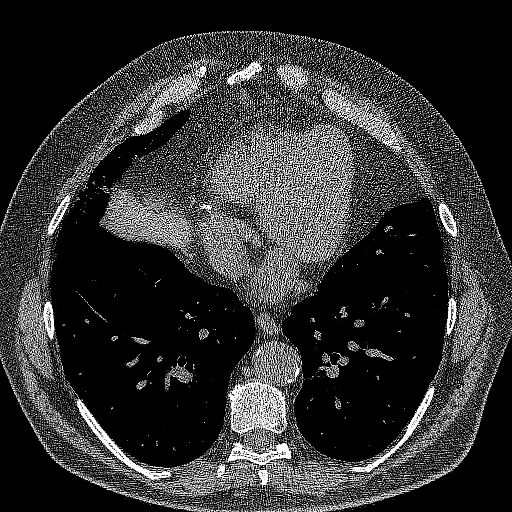
[im 125/343  lung]
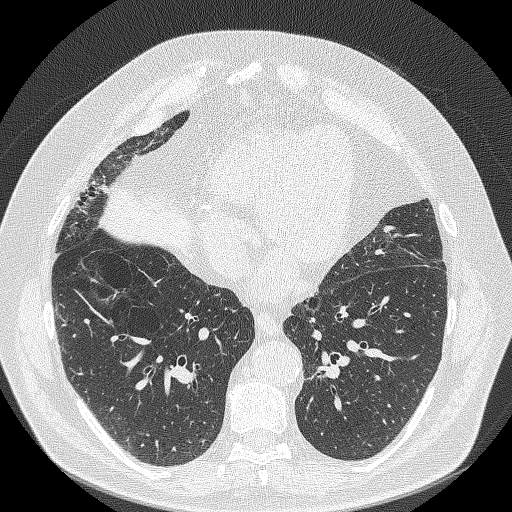
[im 156/343  lung]
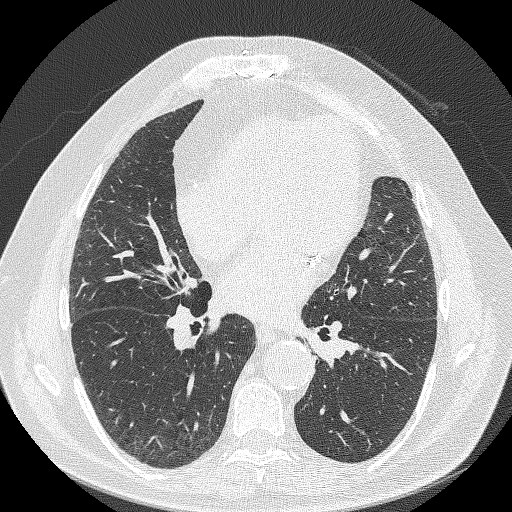
[im 187/343  lung]
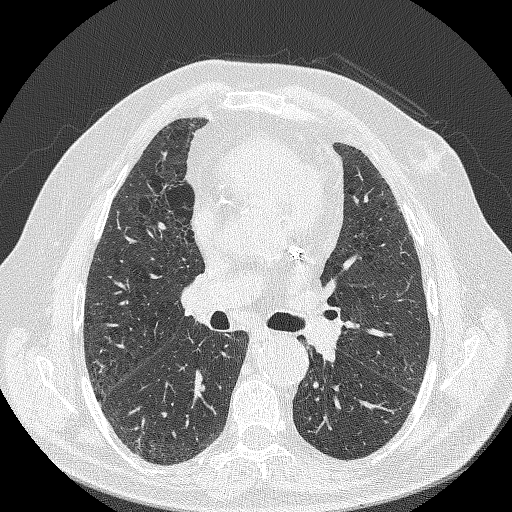
[im 218/343  lung]
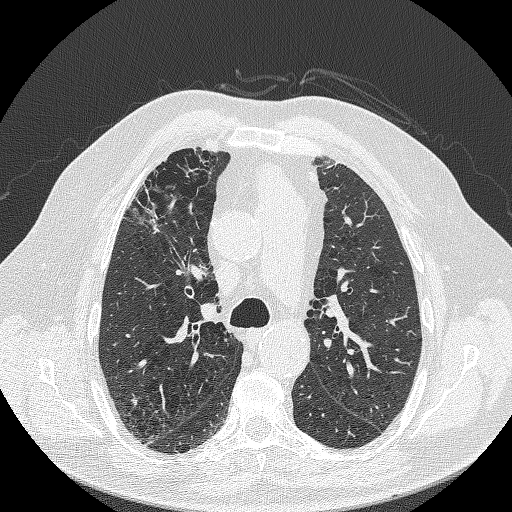
[im 234/343  mediastinal]
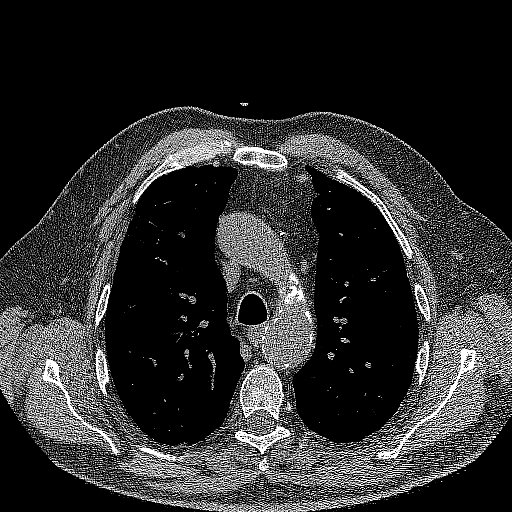
[im 234/343  lung]
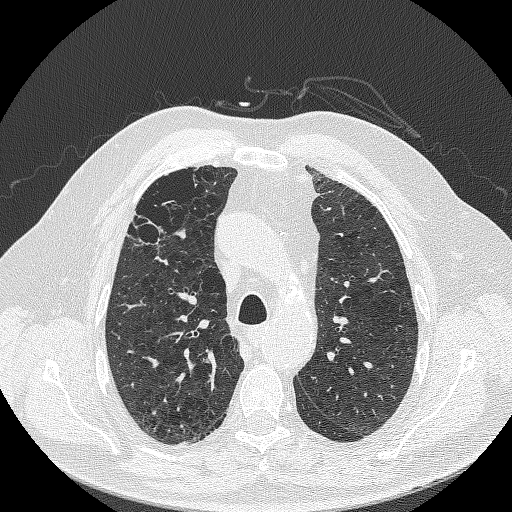
[im 265/343  lung]
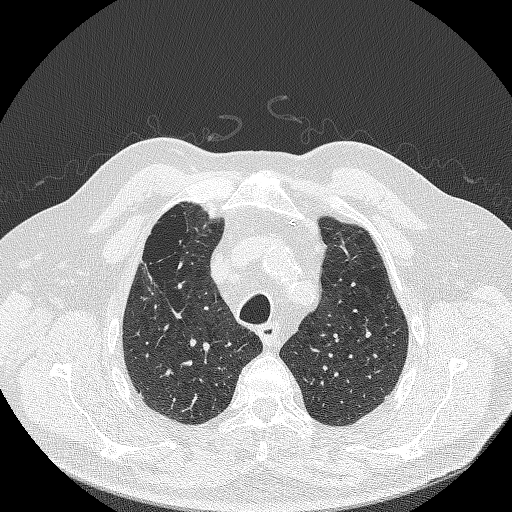
[im 296/343  lung]
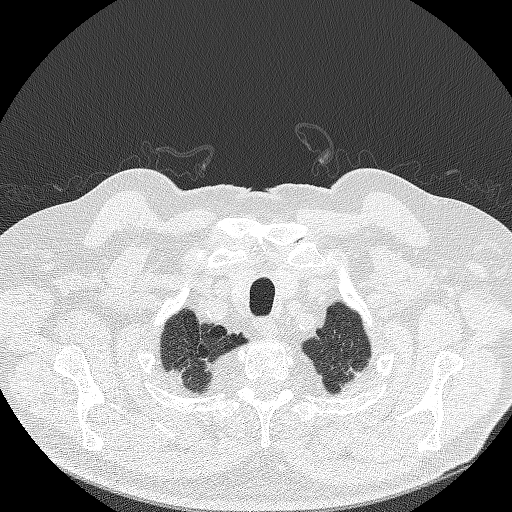
[im 327/343  lung]
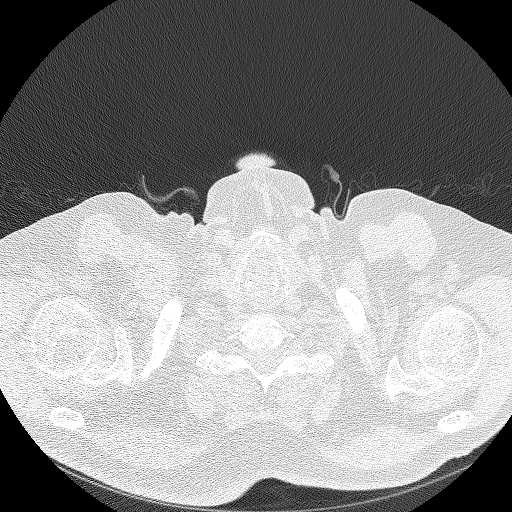

[Series 5: coronal lung · coronal · 0.67mm/px · 3 of 351 slices shown]
[im 71/351  lung]
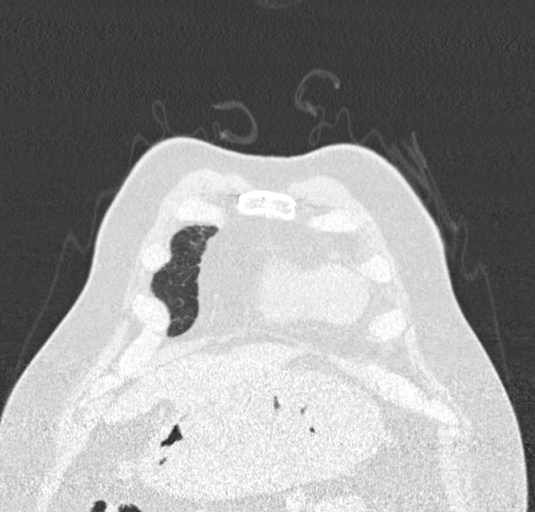
[im 141/351  lung]
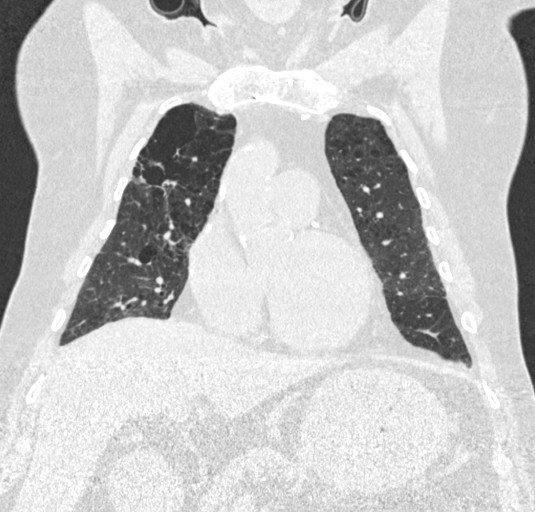
[im 211/351  lung]
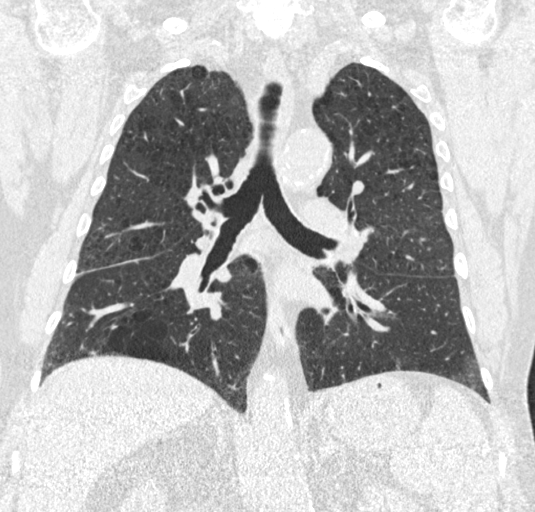

[15 of 40 positions shown; findings below may reference images not displayed]

FINDINGS: Cardiovascular: Aortic and branch vessel atherosclerosis. Tortuous
thoracic aorta. Normal heart size, without pericardial effusion.
Multivessel coronary artery atherosclerosis.

Mediastinum/Nodes: No mediastinal or definite hilar adenopathy,
given limitations of unenhanced CT.

Lungs/Pleura: No pleural fluid. Moderate centrilobular and
paraseptal emphysema. Isolated right middle lobe probable pulmonary
nodule, along a branching vessel, including at volume derived
equivalent diameter 5.7 mm.

Upper Abdomen: High right hepatic lobe cyst of approximately 1.4 cm.
Normal imaged portions of the spleen, adrenal glands, kidneys. The
stomach appears thick walled, but is underdistended. Example image
62/3.

Musculoskeletal: Moderate left-sided gynecomastia. No acute osseous
abnormality.
IMPRESSION: 1. Lung-RADS 2, benign appearance or behavior. Continue annual
screening with low-dose chest CT without contrast in 12 months.
2. Apparent gastric wall thickening, at least partially felt to be
due to underdistention. Correlate with any symptoms of gastritis or
other gastric pathology. Consider further evaluation with endoscopy,
if indicated.
3. Aortic atherosclerosis ([2K]-[2K]), coronary artery
atherosclerosis and emphysema ([2K]-[2K]).

## 2019-01-27 NOTE — Progress Notes (Addendum)
Virtual Visit via Video Enabled Telemedicine Note   I connected with Dylan Goodman on 01/27/19 EST at 11:45 am est by video enabled telemedicine visit and verified that I am speaking with the correct person using two identifiers.   I discussed the limitations, risks, security and privacy concerns of performing an evaluation and management service by telemedicine and the availability of in-person appointments. I also discussed with the patient that there may be a patient responsible charge related to this service. The patient expressed understanding and agreed to proceed.   Other persons participating in the visit and their role in the encounter: Burgess Estelle, RN  Patient's location: clinic  Provider's location: home  Chief Complaint: Low Dose CT Screening  Patient agreed to evaluation by telephone/telemedicine to discuss shared decision making for consideration of low dose CT lung cancer screening.    In accordance with CMS guidelines, patient has met eligibility criteria including age, absence of signs or symptoms of lung cancer.  Social History   Tobacco Use  . Smoking status: Former Smoker    Packs/day: 1.50    Years: 50.00    Pack years: 75.00    Types: Cigarettes    Quit date: 02/18/2017    Years since quitting: 1.9  . Smokeless tobacco: Never Used  Substance Use Topics  . Alcohol use: Not Currently     A shared decision-making session was conducted prior to the performance of CT scan. This includes one or more decision aids, includes benefits and harms of screening, follow-up diagnostic testing, over-diagnosis, false positive rate, and total radiation exposure.   Counseling on the importance of adherence to annual lung cancer LDCT screening, impact of co-morbidities, and ability or willingness to undergo diagnosis and treatment is imperative for compliance of the program.   Counseling on the importance of continued smoking cessation for former smokers; the importance  of smoking cessation for current smokers, and information about tobacco cessation interventions have been given to patient including [place] and [facility] quit [place] programs.   Written order for lung cancer screening with LDCT has been given to the patient and any and all questions have been answered to the best of my abilities.    Yearly follow up will be coordinated by Burgess Estelle, Thoracic Navigator.  I discussed the assessment and treatment plan with the patient. The patient was provided an opportunity to ask questions and all were answered. The patient agreed with the plan and demonstrated an understanding of the instructions.   The patient was advised to call back or seek an in-person evaluation if the symptoms worsen or if the condition fails to improve as anticipated.   I provided 15 minutes of face-to-face video visit time during this encounter, and > 50% was spent counseling as documented under my assessment & plan.   Beckey Rutter, DNP, AGNP-C [place] at [facility] [phone] (work cell) [phone] (office)

## 2019-01-29 ENCOUNTER — Encounter: Payer: Self-pay | Admitting: *Deleted

## 2019-01-29 ENCOUNTER — Encounter: Payer: Self-pay | Admitting: Family Medicine

## 2019-01-29 DIAGNOSIS — I700 Atherosclerosis of aorta: Secondary | ICD-10-CM | POA: Insufficient documentation

## 2019-02-09 ENCOUNTER — Ambulatory Visit: Payer: Medicare Other | Admitting: Nurse Practitioner

## 2019-02-12 ENCOUNTER — Ambulatory Visit (INDEPENDENT_AMBULATORY_CARE_PROVIDER_SITE_OTHER): Payer: Medicare Other | Admitting: Nurse Practitioner

## 2019-02-12 ENCOUNTER — Encounter: Payer: Self-pay | Admitting: Nurse Practitioner

## 2019-02-12 ENCOUNTER — Other Ambulatory Visit: Payer: Self-pay

## 2019-02-12 VITALS — Resp 16

## 2019-02-12 DIAGNOSIS — K529 Noninfective gastroenteritis and colitis, unspecified: Secondary | ICD-10-CM | POA: Diagnosis not present

## 2019-02-12 DIAGNOSIS — K2960 Other gastritis without bleeding: Secondary | ICD-10-CM | POA: Diagnosis not present

## 2019-02-12 DIAGNOSIS — M791 Myalgia, unspecified site: Secondary | ICD-10-CM

## 2019-02-12 DIAGNOSIS — E1122 Type 2 diabetes mellitus with diabetic chronic kidney disease: Secondary | ICD-10-CM | POA: Diagnosis not present

## 2019-02-12 DIAGNOSIS — Z794 Long term (current) use of insulin: Secondary | ICD-10-CM

## 2019-02-12 DIAGNOSIS — N184 Chronic kidney disease, stage 4 (severe): Secondary | ICD-10-CM

## 2019-02-12 NOTE — Progress Notes (Signed)
Virtual Visit via Video Note  I connected with Dylan Goodman on 02/12/19 at  1:40 PM EDT by a video enabled telemedicine application and verified that I am speaking with the correct person using two identifiers.   Staff discussed the limitations of evaluation and management by telemedicine and the availability of in person appointments. The patient expressed understanding and agreed to proceed.  Patient location: home  My location: work office Other people present: none HPI   Patient endorses chronic diarrhea for the past three years. Patient endorses muscle cramps in bilateral calves over the past 2 weeks. States has been alternating legs- not typically both at the same time. Improved with walking, typically happening at night.  No increased exertion. Muscle cramping is intermittent but unchanged in intensity. Calves are non-tender. No weakness, joint pain, headaches, malaise. Denies fever, chills, rashes, nausea, vomiting.  Sees Dr. Alice Reichert for chronic diarrhea- diagnosed with menetirer's disese Of note he does take lovastatin, fenofibrate and zetia, has been on this regimen for years.  Takes diabetes medications as prescribed without missed doses Lab Results  Component Value Date   HGBA1C 6.3 (H) 09/08/2018    PHQ2/9: Depression screen [facility] 2/9 02/12/2019 01/16/2019 09/08/2018  Decreased Interest 0 0 0  Down, Depressed, Hopeless 0 0 0  PHQ - 2 Score 0 0 0  Altered sleeping 0 0 -  Tired, decreased energy 0 0 -  Change in appetite 0 0 -  Feeling bad or failure about yourself  0 0 -  Trouble concentrating 0 0 -  Moving slowly or fidgety/restless 0 0 -  Suicidal thoughts 0 0 -  PHQ-9 Score 0 0 -  Difficult doing work/chores Not difficult at all Not difficult at all -    PHQ reviewed. Negative  Patient Active Problem List   Diagnosis Date Noted  . Atherosclerosis of aorta ([place]) 01/29/2019  . Type 2 diabetes mellitus with stage 4 chronic kidney disease, with long-term current use of  insulin ([place]) 09/13/2018  . Hyperlipidemia, mixed 09/13/2018  . Senile purpura ([place]) 09/08/2018  . Morbid obesity ([place]) 09/08/2018  . Chronic diarrhea of unknown origin 09/04/2018  . Menetrier's disease (hyperplastic hypersecretory gastropathy) 09/04/2018  . Chronic depression 09/03/2018  . Chronic kidney disease 09/03/2018  . History of gastric ulcer 09/03/2018  . Gastritis 09/03/2018  . GERD (gastroesophageal reflux disease) 09/03/2018  . IDA (iron deficiency anemia) 09/03/2018  . Low magnesium levels 09/03/2018  . History of cancer chemotherapy 09/03/2018  . Chronic systolic CHF (congestive heart failure), NYHA class 2 ([place]) 09/02/2018  . Atherosclerosis of native arteries of extremity with rest pain ([place]) 07/21/2018  . Benign essential HTN 07/21/2018  . Bilateral carotid artery stenosis 07/21/2018  . Coronary artery disease involving coronary bypass graft of native heart 07/21/2018    Past Medical History:  Diagnosis Date  . Bleeding gastrointestinal 09/03/2018  . 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 Chronic depression 09/03/2018  . CKD (chronic kidney disease) 09/03/2018  . Coronary atherosclerosis 09/03/2018  . Coronary bypass graft mechanical complication 35/4656  . Diabetes mellitus without complication ([place])   . Gastric ulcer with hemorrhage 09/03/2018  . Gastritis 09/03/2018  . GERD (gastroesophageal reflux disease) 09/03/2018  . Heart disease   . Heart murmur   . IDA (iron deficiency anemia) 09/03/2018  . Low magnesium levels 09/03/2018  . 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 Myocardial infarction ([place]) 2017  . Personal history of radiation therapy 05/30/2018   39 treatments with  chemotherapy nasopharyngeal cancer  . 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 CAROTID ENDARTERECTOMY  2006  . CORONARY ARTERY BYPASS GRAFT  1996   quadruple bypass  . heart  stent  10/2010   x3  . 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 left leg stents  08/2009  . LOWER EXTREMITY ANGIOGRAPHY Left 11/11/2018   Procedure: LOWER EXTREMITY ANGIOGRAPHY;  Surgeon: Katha Cabal, MD;  Location: [facility];  Service: Cardiovascular;  Laterality: Left;  . REVISION OF AORTA BIFEMORAL BYPASS    . right femo pop bypass  06/1998   11/2001, 08/2003  . right leg stent  07/2009    Social History   Tobacco Use  . Smoking status: Former Smoker    Packs/day: 1.50    Years: 50.00    Pack years: 75.00    Types: Cigarettes    Quit date: 02/18/2017    Years since quitting: 1.9  . Smokeless tobacco: Never Used  Substance Use Topics  . Alcohol use: Not Currently     Current Outpatient Medications:  .  carvedilol (COREG) 6.25 MG tablet, Take 6.25 mg by mouth 2 (two) times daily. , Disp: , Rfl:  .  cilostazol (PLETAL) 100 MG tablet, Take 100 mg by mouth 2 (two) times daily. , Disp: , Rfl:  .  diphenoxylate-atropine (LOMOTIL) 2.5-0.025 MG tablet, Take 1 tablet by mouth daily. , Disp: , Rfl:  .  ELIQUIS 2.5 MG TABS tablet, Take 2.5 mg by mouth 2 (two) times daily. , Disp: , Rfl:  .  ezetimibe (ZETIA) 10 MG tablet, Take 10 mg by mouth daily. , Disp: , Rfl:  .  fenofibrate 160 MG tablet, Take 160 mg by mouth daily. , Disp: , Rfl:  .  Insulin Degludec (TRESIBA FLEXTOUCH) 200 UNIT/ML SOPN, Inject 14 Units into the skin at bedtime. , Disp: , Rfl:  .  isosorbide mononitrate (IMDUR) 30 MG 24 hr tablet, Take 30 mg by mouth daily., Disp: , Rfl:  .  isosorbide mononitrate (IMDUR) 60 MG 24 hr tablet, Take 60 mg by mouth daily., Disp: , Rfl:  .  lansoprazole (PREVACID) 30 MG capsule, Take 30 mg by mouth every morning. , Disp: , Rfl:  .  losartan (COZAAR) 25 MG tablet, Take 25 mg by mouth daily. , Disp: , Rfl:  .  lovastatin (MEVACOR) 40 MG tablet, Take 40 mg by mouth 2 (two) times daily. , Disp: , Rfl:  .  nitroGLYCERIN (NITROSTAT) 0.4 MG SL tablet, Place 0.4 mg under  the tongue every 5 (five) minutes as needed for chest pain. , Disp: , Rfl:  .  sodium bicarbonate 650 MG tablet, Take 1 tablet by mouth 2 (two) times a day., Disp: , Rfl:  .  ticagrelor (BRILINTA) 60 MG TABS tablet, Take 60 mg by mouth 2 (two) times daily. , Disp: , Rfl:  .  Continuous Blood Gluc Sensor (FREESTYLE LIBRE 14 DAY SENSOR) MISC, APP 1 SENSOR EVERY 14 DAYS, Disp: , Rfl:  .  Continuous Blood Gluc Sensor (FREESTYLE LIBRE 14 DAY SENSOR) MISC, Use 1 each every 14 (fourteen) days, Disp: , Rfl:  .  Insulin Pen Needle (BD PEN NEEDLE NANO U/F) 32G X 4 MM MISC, U ONCE D UTD, Disp: , Rfl:   No Known Allergies  ROS   No other specific complaints in a complete review of systems (except as listed  in HPI above).  Objective  Vitals:   02/12/19 1333  Resp: 16     There is no height or weight on file to calculate BMI.  Nursing Note and Vital Signs reviewed.  Physical Exam   Constitutional: Patient appears well-developed and well-nourished. No distress.  HENT: Head: Normocephalic and atraumatic. Pulmonary/Chest: Effort normal  Musculoskeletal: Normal range of motion, no calf redness, swelling or tenderness. Full ROM with lower extremities.  Neurological: alert and oriented, speech normal.  Skin: No rash noted. No erythema.  Psychiatric: Patient has a normal mood and affect. behavior is normal. Judgment and thought content normal.    Assessment & Plan  1. Myalgia Discussed hydration, mild electrolyte replacement, calf and hamstring stretches before wore.  - COMPLETE METABOLIC PANEL WITH GFR - CBC with Differential  2. Chronic diarrhea Follow up with GI  - COMPLETE METABOLIC PANEL WITH GFR - CBC with Differential  3. Menetrier disease Follow up with GI   4. Controlled type 2 diabetes mellitus with stage 4 chronic kidney disease, with long-term current use of insulin (HCC) Well controlled.     Follow Up Instructions: PRN   I discussed the assessment and treatment  plan with the patient. The patient was provided an opportunity to ask questions and all were answered. The patient agreed with the plan and demonstrated an understanding of the instructions.   The patient was advised to call back or seek an in-person evaluation if the symptoms worsen or if the condition fails to improve as anticipated.  I provided 22 minutes of non-face-to-face time during this encounter.   Fredderick Severance, NP

## 2019-02-16 ENCOUNTER — Other Ambulatory Visit: Payer: Self-pay | Admitting: Nurse Practitioner

## 2019-02-16 DIAGNOSIS — D649 Anemia, unspecified: Secondary | ICD-10-CM

## 2019-02-19 LAB — COMPLETE METABOLIC PANEL WITH GFR
AG Ratio: 1.5 (calc) (ref 1.0–2.5)
ALT: 14 U/L (ref 9–46)
AST: 17 U/L (ref 10–35)
Albumin: 3.8 g/dL (ref 3.6–5.1)
Alkaline phosphatase (APISO): 50 U/L (ref 35–144)
BUN/Creatinine Ratio: 20 (calc) (ref 6–22)
BUN: 67 mg/dL — ABNORMAL HIGH (ref 7–25)
CO2: 13 mmol/L — ABNORMAL LOW (ref 20–32)
Calcium: 7 mg/dL — ABNORMAL LOW (ref 8.6–10.3)
Chloride: 114 mmol/L — ABNORMAL HIGH (ref 98–110)
Creat: 3.36 mg/dL — ABNORMAL HIGH (ref 0.70–1.25)
GFR, Est African American: 21 mL/min/{1.73_m2} — ABNORMAL LOW (ref >=60)
GFR, Est Non African American: 18 mL/min/{1.73_m2} — ABNORMAL LOW (ref >=60)
Globulin: 2.6 g/dL (calc) (ref 1.9–3.7)
Glucose, Bld: 103 mg/dL — ABNORMAL HIGH (ref 65–99)
Potassium: 4.8 mmol/L (ref 3.5–5.3)
Sodium: 139 mmol/L (ref 135–146)
Total Bilirubin: 0.4 mg/dL (ref 0.2–1.2)
Total Protein: 6.4 g/dL (ref 6.1–8.1)

## 2019-02-19 LAB — IRON,TIBC AND FERRITIN PANEL
%SAT: 24 % (calc) (ref 20–48)
Ferritin: 304 ng/mL (ref 24–380)
Iron: 63 ug/dL (ref 50–180)
TIBC: 265 mcg/dL (calc) (ref 250–425)

## 2019-02-19 LAB — CBC WITH DIFFERENTIAL/PLATELET
Absolute Monocytes: 782 cells/uL (ref 200–950)
Basophils Absolute: 94 cells/uL (ref 0–200)
Basophils Relative: 1.1 %
Eosinophils Absolute: 383 cells/uL (ref 15–500)
Eosinophils Relative: 4.5 %
HCT: 38.1 % — ABNORMAL LOW (ref 38.5–50.0)
Hemoglobin: 12.6 g/dL — ABNORMAL LOW (ref 13.2–17.1)
Lymphs Abs: 2032 cells/uL (ref 850–3900)
MCH: 30.3 pg (ref 27.0–33.0)
MCHC: 33.1 g/dL (ref 32.0–36.0)
MCV: 91.6 fL (ref 80.0–100.0)
MPV: 9.7 fL (ref 7.5–12.5)
Monocytes Relative: 9.2 %
Neutro Abs: 5211 cells/uL (ref 1500–7800)
Neutrophils Relative %: 61.3 %
Platelets: 213 10*3/uL (ref 140–400)
RBC: 4.16 10*6/uL — ABNORMAL LOW (ref 4.20–5.80)
RDW: 13.9 % (ref 11.0–15.0)
Total Lymphocyte: 23.9 %
WBC: 8.5 10*3/uL (ref 3.8–10.8)

## 2019-02-19 LAB — TEST AUTHORIZATION

## 2019-02-24 ENCOUNTER — Other Ambulatory Visit: Payer: Self-pay

## 2019-02-24 ENCOUNTER — Other Ambulatory Visit
Admission: RE | Admit: 2019-02-24 | Discharge: 2019-02-24 | Disposition: A | Discharge status: Home / Self Care | Payer: Medicare Other | Source: Ambulatory Visit | Attending: Internal Medicine | Admitting: Internal Medicine

## 2019-02-24 DIAGNOSIS — Z20828 Contact with and (suspected) exposure to other viral communicable diseases: Secondary | ICD-10-CM | POA: Insufficient documentation

## 2019-02-25 ENCOUNTER — Other Ambulatory Visit: Payer: Self-pay | Admitting: Orthopedic Surgery

## 2019-02-25 ENCOUNTER — Other Ambulatory Visit (HOSPITAL_COMMUNITY): Payer: Self-pay | Admitting: Orthopedic Surgery

## 2019-02-25 DIAGNOSIS — G8929 Other chronic pain: Secondary | ICD-10-CM

## 2019-02-25 DIAGNOSIS — M25552 Pain in left hip: Secondary | ICD-10-CM

## 2019-02-25 LAB — SARS CORONAVIRUS 2 (TAT 6-24 HRS): SARS Coronavirus 2: NEGATIVE

## 2019-02-27 ENCOUNTER — Ambulatory Visit
Admission: RE | Admit: 2019-02-27 | Discharge: 2019-02-27 | Disposition: A | Discharge status: Home / Self Care | Payer: Medicare Other | Attending: Internal Medicine | Admitting: Internal Medicine

## 2019-02-27 ENCOUNTER — Encounter
Admission: RE | Disposition: A | Discharge status: Home / Self Care | Payer: Self-pay | Source: Home / Self Care | Attending: Internal Medicine

## 2019-02-27 ENCOUNTER — Other Ambulatory Visit: Payer: Self-pay

## 2019-02-27 DIAGNOSIS — N184 Chronic kidney disease, stage 4 (severe): Secondary | ICD-10-CM | POA: Diagnosis not present

## 2019-02-27 DIAGNOSIS — Z539 Procedure and treatment not carried out, unspecified reason: Secondary | ICD-10-CM | POA: Diagnosis not present

## 2019-02-27 DIAGNOSIS — R079 Chest pain, unspecified: Secondary | ICD-10-CM

## 2019-02-27 DIAGNOSIS — Z794 Long term (current) use of insulin: Secondary | ICD-10-CM | POA: Insufficient documentation

## 2019-02-27 DIAGNOSIS — E1122 Type 2 diabetes mellitus with diabetic chronic kidney disease: Secondary | ICD-10-CM | POA: Diagnosis not present

## 2019-02-27 DIAGNOSIS — I4891 Unspecified atrial fibrillation: Secondary | ICD-10-CM | POA: Diagnosis not present

## 2019-02-27 DIAGNOSIS — E872 Acidosis: Secondary | ICD-10-CM | POA: Insufficient documentation

## 2019-02-27 DIAGNOSIS — R0602 Shortness of breath: Secondary | ICD-10-CM | POA: Insufficient documentation

## 2019-02-27 DIAGNOSIS — Z1159 Encounter for screening for other viral diseases: Secondary | ICD-10-CM | POA: Diagnosis not present

## 2019-02-27 DIAGNOSIS — I2510 Atherosclerotic heart disease of native coronary artery without angina pectoris: Secondary | ICD-10-CM | POA: Insufficient documentation

## 2019-02-27 DIAGNOSIS — K529 Noninfective gastroenteritis and colitis, unspecified: Secondary | ICD-10-CM | POA: Diagnosis not present

## 2019-02-27 DIAGNOSIS — Z951 Presence of aortocoronary bypass graft: Secondary | ICD-10-CM | POA: Diagnosis not present

## 2019-02-27 DIAGNOSIS — I129 Hypertensive chronic kidney disease with stage 1 through stage 4 chronic kidney disease, or unspecified chronic kidney disease: Secondary | ICD-10-CM | POA: Insufficient documentation

## 2019-02-27 DIAGNOSIS — I25810 Atherosclerosis of coronary artery bypass graft(s) without angina pectoris: Secondary | ICD-10-CM | POA: Diagnosis present

## 2019-02-27 DIAGNOSIS — E785 Hyperlipidemia, unspecified: Secondary | ICD-10-CM | POA: Insufficient documentation

## 2019-02-27 LAB — COMPREHENSIVE METABOLIC PANEL
ALT: 14 U/L (ref 0–44)
AST: 18 U/L (ref 15–41)
Albumin: 3.5 g/dL (ref 3.5–5.0)
Alkaline Phosphatase: 53 U/L (ref 38–126)
Anion gap: 12 (ref 5–15)
BUN: 68 mg/dL — ABNORMAL HIGH (ref 8–23)
CO2: 14 mmol/L — ABNORMAL LOW (ref 22–32)
Calcium: 6.4 mg/dL — CL (ref 8.9–10.3)
Chloride: 113 mmol/L — ABNORMAL HIGH (ref 98–111)
Creatinine, Ser: 3.23 mg/dL — ABNORMAL HIGH (ref 0.61–1.24)
GFR calc Af Amer: 22 mL/min — ABNORMAL LOW (ref >60)
GFR calc non Af Amer: 19 mL/min — ABNORMAL LOW (ref >60)
Glucose, Bld: 104 mg/dL — ABNORMAL HIGH (ref 70–99)
Potassium: 3.9 mmol/L (ref 3.5–5.1)
Sodium: 139 mmol/L (ref 135–145)
Total Bilirubin: 0.6 mg/dL (ref 0.3–1.2)
Total Protein: 6.4 g/dL — ABNORMAL LOW (ref 6.5–8.1)

## 2019-02-27 SURGERY — LEFT HEART CATH AND CORS/GRAFTS ANGIOGRAPHY
Anesthesia: Moderate Sedation

## 2019-02-27 MED ORDER — SODIUM CHLORIDE 0.9 % WEIGHT BASED INFUSION: 1.0000 mL/kg/h | INTRAVENOUS | Status: DC

## 2019-02-27 MED ORDER — SODIUM CHLORIDE 0.9% FLUSH: 3.0000 mL | INTRAVENOUS | Status: DC | PRN

## 2019-02-27 MED ORDER — SODIUM CHLORIDE 0.9% FLUSH: 3.0000 mL | Freq: Two times a day (BID) | INTRAVENOUS | Status: DC

## 2019-02-27 MED ORDER — ASPIRIN 81 MG PO CHEW
81.0000 mg | CHEWABLE_TABLET | ORAL | Status: AC
  Administered 2019-02-27: 81 mg via ORAL

## 2019-02-27 MED ORDER — SODIUM CHLORIDE 0.9 % IV SOLN: 250.0000 mL | INTRAVENOUS | Status: DC | PRN

## 2019-02-27 MED ORDER — DEXTROSE 50 % IV SOLN
50.0000 mL | Freq: Once | INTRAVENOUS | Status: AC
  Administered 2019-02-27 (×2): 25 mL via INTRAVENOUS

## 2019-02-27 MED ORDER — SODIUM CHLORIDE 0.9 % WEIGHT BASED INFUSION
3.0000 mL/kg/h | INTRAVENOUS | Status: AC
  Administered 2019-02-27: 3 mL/kg/h via INTRAVENOUS

## 2019-02-27 MED ORDER — ASPIRIN 81 MG PO CHEW
CHEWABLE_TABLET | ORAL | Status: AC
  Administered 2019-02-27: 81 mg via ORAL
  Filled 2019-02-27: qty 1

## 2019-02-27 MED ORDER — DEXTROSE 50 % IV SOLN
INTRAVENOUS | Status: AC
  Administered 2019-02-27: 25 mL via INTRAVENOUS
  Filled 2019-02-27: qty 50

## 2019-02-27 MED ORDER — RANOLAZINE ER 500 MG PO TB12: 500.0000 mg | ORAL_TABLET | Freq: Two times a day (BID) | ORAL | Status: DC

## 2019-02-27 NOTE — Discharge Instructions (Signed)
Ranolazine tablets, extended release What is this medicine? RANOLAZINE (ra NOE la zeen) is a heart medicine. It is used to treat chronic chest pain (angina). This medicine must be taken regularly. It will not relieve an acute episode of chest pain. This medicine may be used for other purposes; ask your health care provider or pharmacist if you have questions. COMMON BRAND NAME(S): Ranexa What should I tell my health care provider before I take this medicine? They need to know if you have any of these conditions:  heart disease  irregular heartbeat  kidney disease  liver disease  low levels of potassium or magnesium in the blood  an unusual or allergic reaction to ranolazine, other medicines, foods, dyes, or preservatives  pregnant or trying to get pregnant  breast-feeding How should I use this medicine? Take this medicine by mouth with a glass of water. Follow the directions on the prescription label. Do not cut, crush, or chew this medicine. Take with or without food. Do not take this medication with grapefruit juice. Take your doses at regular intervals. Do not take your medicine more often then directed. Talk to your pediatrician regarding the use of this medicine in children. Special care may be needed. Overdosage: If you think you have taken too much of this medicine contact a poison control center or emergency room at once. NOTE: This medicine is only for you. Do not share this medicine with others. What if I miss a dose? If you miss a dose, take it as soon as you can. If it is almost time for your next dose, take only that dose. Do not take double or extra doses. What may interact with this medicine? Do not take this medicine with any of the following medications:  antivirals for HIV or AIDS  cerivastatin  certain antibiotics like chloramphenicol, clarithromycin, dalfopristin; quinupristin, isoniazid, rifabutin, rifampin, rifapentine  certain medicines used for cancer  like imatinib, nilotinib  certain medicines for fungal infections like fluconazole, itraconazole, ketoconazole, posaconazole, voriconazole  certain medicines for irregular heart beat like dronedarone  certain medicines for seizures like carbamazepine, fosphenytoin, oxcarbazepine, phenobarbital, phenytoin  cisapride  conivaptan  cyclosporine  grapefruit or grapefruit juice  lumacaftor; ivacaftor  nefazodone  pimozide  quinacrine  St John's wort  thioridazine This medicine may also interact with the following medications:  alfuzosin  certain medicines for depression, anxiety, or psychotic disturbances like bupropion, citalopram, fluoxetine, fluphenazine, paroxetine, perphenazine, risperidone, sertraline, trifluoperazine  certain medicines for cholesterol like atorvastatin, lovastatin, simvastatin  certain medicines for stomach problems like octreotide, palonosetron, prochlorperazine  eplerenone  ergot alkaloids like dihydroergotamine, ergonovine, ergotamine, methylergonovine  metformin  nicardipine  other medicines that prolong the QT interval (cause an abnormal heart rhythm) like dofetilide, ziprasidone  sirolimus  tacrolimus This list may not describe all possible interactions. Give your health care provider a list of all the medicines, herbs, non-prescription drugs, or dietary supplements you use. Also tell them if you smoke, drink alcohol, or use illegal drugs. Some items may interact with your medicine. What should I watch for while using this medicine? Visit your doctor for regular check ups. Tell your doctor or healthcare professional if your symptoms do not start to get better or if they get worse. This medicine will not relieve an acute attack of angina or chest pain. This medicine can change your heart rhythm. Your health care provider may check your heart rhythm by ordering an electrocardiogram (ECG) while you are taking this medicine. You may get drowsy  or  dizzy. Do not drive, use machinery, or do anything that needs mental alertness until you know how this medicine affects you. Do not stand or sit up quickly, especially if you are an older patient. This reduces the risk of dizzy or fainting spells. Alcohol may interfere with the effect of this medicine. Avoid alcoholic drinks. If you are scheduled for any medical or dental procedure, tell your healthcare provider that you are taking this medicine. This medicine can interact with other medicines used during surgery. What side effects may I notice from receiving this medicine? Side effects that you should report to your doctor or health care professional as soon as possible:  allergic reactions like skin rash, itching or hives, swelling of the face, lips, or tongue  breathing problems  changes in vision  fast, irregular or pounding heartbeat  feeling faint or lightheaded, falls  low or high blood pressure  numbness or tingling feelings  ringing in the ears  tremor or shakiness  slow heartbeat (fewer than 50 beats per minute)  swelling of the legs or feet Side effects that usually do not require medical attention (report to your doctor or health care professional if they continue or are bothersome):  constipation  drowsy  dry mouth  headache  nausea or vomiting  stomach upset This list may not describe all possible side effects. Call your doctor for medical advice about side effects. You may report side effects to FDA at 1-800-FDA-1088. Where should I keep my medicine? Keep out of the reach of children. Store at room temperature between 15 and 30 degrees C (59 and 86 degrees F). Throw away any unused medicine after the expiration date. NOTE: This sheet is a summary. It may not cover all possible information. If you have questions about this medicine, talk to your doctor, pharmacist, or health care provider.  2020 Elsevier/Gold Standard (2018-07-08  09:18:49)     Chronic Kidney Disease, Adult Chronic kidney disease (CKD) happens when the kidneys are damaged over a long period of time. The kidneys are two organs that help with:  Getting rid of waste and extra fluid from the blood.  Making hormones that maintain the amount of fluid in your tissues and blood vessels.  Making sure that the body has the right amount of fluids and chemicals. Most of the time, CKD does not go away, but it can usually be controlled. Steps must be taken to slow down the kidney damage or to stop it from getting worse. If this is not done, the kidneys may stop working. Follow these instructions at home: Medicines  Take over-the-counter and prescription medicines only as told by your doctor. You may need to change the amount of medicines you take.  Do not take any new medicines unless your doctor says it is okay. Many medicines can make your kidney damage worse.  Do not take any vitamin and supplements unless your doctor says it is okay. Many vitamins and supplements can make your kidney damage worse. General instructions  Follow a diet as told by your doctor. You may need to stay away from: ? Alcohol. ? Salty foods. ? Foods that are high in:  Potassium.  Calcium.  Protein.  Do not use any products that contain nicotine or tobacco, such as cigarettes and e-cigarettes. If you need help quitting, ask your doctor.  Keep track of your blood pressure at home. Tell your doctor about any changes.  If you have diabetes, keep track of your blood sugar as told  by your doctor.  Try to stay at a healthy weight. If you need help, ask your doctor.  Exercise at least 30 minutes a day, 5 days a week.  Stay up-to-date with your shots (immunizations) as told by your doctor.  Keep all follow-up visits as told by your doctor. This is important. Contact a doctor if:  Your symptoms get worse.  You have new symptoms. Get help right away if:  You have  symptoms of end-stage kidney disease. These may include: ? Headaches. ? Numbness in your hands or feet. ? Easy bruising. ? Having hiccups often. ? Chest pain. ? Shortness of breath. ? Stopping of menstrual periods in women.  You have a fever.  You have very little pee (urine).  You have pain or bleeding when you pee. Summary  Chronic kidney disease (CKD) happens when the kidneys are damaged over a long period of time.  Most of the time, this condition does not go away, but it can usually be controlled. Steps must be taken to slow down the kidney damage or to stop it from getting worse.  Treatment may include a combination of medicines and lifestyle changes. This information is not intended to replace advice given to you by your health care provider. Make sure you discuss any questions you have with your health care provider. Document Released: 10/10/2009 Document Revised: 06/28/2017 Document Reviewed: 08/20/2016 Elsevier Patient Education  2020 [employer].

## 2019-02-27 NOTE — Progress Notes (Signed)
[facility]  ROUNDING NOTE   Subjective:   [name] presents for diagnostic cardiac catheterization. This was canceled due to renal insufficiency. Nephrology was asked to evaluated.   Last seen by Nephrology on 6/3 where creatinine was 3.23, GFR of 19. There was concern for GI losses contributing to his renal insuffiencey. Started on sodium bicarbonate on that encounter for his metabolic acidosis secondary to chronic kidney disease.   Currently patient is presenting with shortness of breath.   Objective:  Vital signs in last 24 hours:  Temp:  [97.9 F (36.6 C)] 97.9 F (36.6 C) (07/31 0744) Pulse Rate:  [101] 101 (07/31 0744) Resp:  [17] 17 (07/31 0744) BP: (125)/(75) 125/75 (07/31 0744) SpO2:  [98 %] 98 % (07/31 0744) Weight:  [100.7 kg] 100.7 kg (07/31 0744)  Weight change:  Filed Weights   02/27/19 0744  Weight: 100.7 kg    Intake/Output: No intake/output data recorded.   Intake/Output this shift:  No intake/output data recorded.  Physical Exam: General: NAD,   Head: Normocephalic, atraumatic. Moist oral mucosal membranes  Eyes: Anicteric, PERRL  Neck: Supple, trachea midline  Lungs:  Clear to auscultation  Heart: Regular rate and rhythm  Abdomen:  Soft, nontender,   Extremities: No peripheral edema.  Neurologic: Nonfocal, moving all four extremities  Skin: No lesions  Access: none    Basic Metabolic Panel: Recent Labs  Lab 02/27/19 0948  NA 139  K 3.9  CL 113*  CO2 14*  GLUCOSE 104*  BUN 68*  CREATININE 3.23*  CALCIUM 6.4*    Liver Function Tests: Recent Labs  Lab 02/27/19 0948  AST 18  ALT 14  ALKPHOS 53  BILITOT 0.6  PROT 6.4*  ALBUMIN 3.5   No results for input(s): LIPASE, AMYLASE in the last 168 hours. No results for input(s): AMMONIA in the last 168 hours.  CBC: No results for input(s): WBC, NEUTROABS, HGB, HCT, MCV, PLT in the last 168 hours.  Cardiac Enzymes: No results for input(s): CKTOTAL, CKMB,  CKMBINDEX, TROPONINI in the last 168 hours.  BNP: Invalid input(s): POCBNP  CBG: No results for input(s): GLUCAP in the last 168 hours.  Microbiology: Results for orders placed or performed during the hospital encounter of 02/24/19  SARS Coronavirus 2 (Performed in [place] hospital lab)     Status: None   Collection Time: 02/24/19 12:10 PM   Specimen: Nasal Swab  Result Value Ref Range Status   SARS Coronavirus 2 NEGATIVE NEGATIVE Final    Comment: (NOTE) SARS-CoV-2 target nucleic acids are NOT DETECTED. The SARS-CoV-2 RNA is generally detectable in upper and lower respiratory specimens during the acute phase of infection. Negative results do not preclude SARS-CoV-2 infection, do not rule out co-infections with other pathogens, and should not be used as the sole basis for treatment or other patient management decisions. Negative results must be combined with clinical observations, patient history, and epidemiological information. The expected result is Negative. Fact Sheet for Patients: SugarRoll.be Fact Sheet for Healthcare Providers: https://www.woods-mathews.com/ This test is not yet approved or cleared by the [place] FDA and  has been authorized for detection and/or diagnosis of SARS-CoV-2 by FDA under an Emergency Use Authorization (EUA). This EUA will remain  in effect (meaning this test can be used) for the duration of the COVID-19 declaration under Section 56 4(b)(1) of the Act, 21 U.S.C. section 360bbb-3(b)(1), unless the authorization is terminated or revoked sooner. Performed at [facility], [place] [address]  [place], [place] [address]     Coagulation Studies: No results for input(s): LABPROT, INR in the last 72 hours.  Urinalysis: No results for input(s): COLORURINE, LABSPEC, PHURINE, GLUCOSEU, HGBUR, BILIRUBINUR, KETONESUR, PROTEINUR, UROBILINOGEN, NITRITE, LEUKOCYTESUR in the last 72 hours.  Invalid  input(s): APPERANCEUR    Imaging: No results found.   Medications:   . sodium chloride    . sodium chloride     . ranolazine  500 mg Oral BID  . sodium chloride flush  3 mL Intravenous Q12H   sodium chloride, sodium chloride flush  Assessment/ Plan:  [name] is a 68 y.o. 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  1. Chronic kidney disease stage IV with metabolic acidosis, proteinuria and hematuria: history suggestive of diabetes, hypertension and possible renal vascular disease. However with nephrotic range proteinuria and rapid progression of renal failure. If Menetrier's disease is treated, could improve patient's kidney function.  - Continue losartan. - Continue sodium bicarbonate. - Labs reviewed. - If cardiac cathetarization is scheduled, recommend IV fluids for 24 hours before and after procedure.   2. Hypertension:   - Carvedilol and losartan  3. Diabetes mellitus type II with chronic kidney disease: insulin dependent. - Continue glucose control.  4. Shortness of breath: undergoing cardiology work up. Cardiology will continue medical management, discussed case with Dr. Nehemiah Massed.    LOS: 0 Toneisha Savary 7/31/202012:37 PM

## 2019-02-27 NOTE — Progress Notes (Signed)
Patient felt as though his sugar was dropping, recheck was 73. Other half of dextrose was administered.

## 2019-02-27 NOTE — Progress Notes (Addendum)
Patient presented today with new atrial fibrillation. Patient is short of breath with ambulation. EKG was obtained. Dr. Nehemiah Massed was informed of arrhythmia.    Patient has glucose monitor on arm, requested to take glucose level with it. First glucose was 70, 25 ml of glucose was given. Follow up glucose is 124.

## 2019-02-28 DIAGNOSIS — I714 Abdominal aortic aneurysm, without rupture, unspecified: Secondary | ICD-10-CM

## 2019-02-28 HISTORY — DX: Abdominal aortic aneurysm, without rupture: I71.4

## 2019-02-28 HISTORY — DX: Abdominal aortic aneurysm, without rupture, unspecified: I71.40

## 2019-03-06 ENCOUNTER — Other Ambulatory Visit: Payer: Self-pay

## 2019-03-06 ENCOUNTER — Other Ambulatory Visit
Admission: RE | Admit: 2019-03-06 | Discharge: 2019-03-06 | Disposition: A | Discharge status: Home / Self Care | Payer: Medicare Other | Source: Ambulatory Visit | Attending: Internal Medicine | Admitting: Internal Medicine

## 2019-03-06 DIAGNOSIS — Z20828 Contact with and (suspected) exposure to other viral communicable diseases: Secondary | ICD-10-CM | POA: Insufficient documentation

## 2019-03-06 DIAGNOSIS — Z01812 Encounter for preprocedural laboratory examination: Secondary | ICD-10-CM | POA: Diagnosis present

## 2019-03-07 LAB — SARS CORONAVIRUS 2 (TAT 6-24 HRS): SARS Coronavirus 2: NEGATIVE

## 2019-03-09 ENCOUNTER — Ambulatory Visit: Payer: Medicare Other | Admitting: Family Medicine

## 2019-03-10 ENCOUNTER — Encounter: Payer: Self-pay | Admitting: Family Medicine

## 2019-03-10 ENCOUNTER — Ambulatory Visit (INDEPENDENT_AMBULATORY_CARE_PROVIDER_SITE_OTHER): Payer: Medicare Other | Admitting: Family Medicine

## 2019-03-10 ENCOUNTER — Other Ambulatory Visit: Payer: Self-pay

## 2019-03-10 VITALS — BP 130/76 | HR 92 | Temp 97.1°F | Resp 16 | Ht 72.0 in | Wt 217.8 lb

## 2019-03-10 DIAGNOSIS — Z794 Long term (current) use of insulin: Secondary | ICD-10-CM

## 2019-03-10 DIAGNOSIS — D692 Other nonthrombocytopenic purpura: Secondary | ICD-10-CM

## 2019-03-10 DIAGNOSIS — N184 Chronic kidney disease, stage 4 (severe): Secondary | ICD-10-CM | POA: Diagnosis not present

## 2019-03-10 DIAGNOSIS — I4891 Unspecified atrial fibrillation: Secondary | ICD-10-CM

## 2019-03-10 DIAGNOSIS — K529 Noninfective gastroenteritis and colitis, unspecified: Secondary | ICD-10-CM

## 2019-03-10 DIAGNOSIS — R0602 Shortness of breath: Secondary | ICD-10-CM

## 2019-03-10 DIAGNOSIS — I6523 Occlusion and stenosis of bilateral carotid arteries: Secondary | ICD-10-CM

## 2019-03-10 DIAGNOSIS — I25118 Atherosclerotic heart disease of native coronary artery with other forms of angina pectoris: Secondary | ICD-10-CM | POA: Diagnosis not present

## 2019-03-10 DIAGNOSIS — E162 Hypoglycemia, unspecified: Secondary | ICD-10-CM | POA: Diagnosis not present

## 2019-03-10 DIAGNOSIS — E1122 Type 2 diabetes mellitus with diabetic chronic kidney disease: Secondary | ICD-10-CM

## 2019-03-10 DIAGNOSIS — Z87891 Personal history of nicotine dependence: Secondary | ICD-10-CM

## 2019-03-10 DIAGNOSIS — I5022 Chronic systolic (congestive) heart failure: Secondary | ICD-10-CM

## 2019-03-10 DIAGNOSIS — R42 Dizziness and giddiness: Secondary | ICD-10-CM

## 2019-03-10 LAB — GLUCOSE, POCT (MANUAL RESULT ENTRY): POC Glucose: 73 mg/dl (ref 70–99)
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
## 2019-03-11 ENCOUNTER — Encounter: Payer: Self-pay | Admitting: Emergency Medicine

## 2019-03-11 ENCOUNTER — Ambulatory Visit: Payer: Medicare Other | Admitting: Certified Registered"

## 2019-03-11 ENCOUNTER — Ambulatory Visit
Admission: RE | Admit: 2019-03-11 | Discharge: 2019-03-11 | Disposition: A | Discharge status: Home / Self Care | Payer: Medicare Other | Attending: Internal Medicine | Admitting: Internal Medicine

## 2019-03-11 ENCOUNTER — Encounter
Admission: RE | Disposition: A | Discharge status: Home / Self Care | Payer: Self-pay | Source: Home / Self Care | Attending: Internal Medicine

## 2019-03-11 DIAGNOSIS — K3189 Other diseases of stomach and duodenum: Secondary | ICD-10-CM | POA: Diagnosis not present

## 2019-03-11 DIAGNOSIS — K621 Rectal polyp: Secondary | ICD-10-CM | POA: Diagnosis not present

## 2019-03-11 DIAGNOSIS — I130 Hypertensive heart and chronic kidney disease with heart failure and stage 1 through stage 4 chronic kidney disease, or unspecified chronic kidney disease: Secondary | ICD-10-CM | POA: Diagnosis not present

## 2019-03-11 DIAGNOSIS — Z7902 Long term (current) use of antithrombotics/antiplatelets: Secondary | ICD-10-CM | POA: Insufficient documentation

## 2019-03-11 DIAGNOSIS — Z8719 Personal history of other diseases of the digestive system: Secondary | ICD-10-CM | POA: Diagnosis not present

## 2019-03-11 DIAGNOSIS — Z7901 Long term (current) use of anticoagulants: Secondary | ICD-10-CM | POA: Diagnosis not present

## 2019-03-11 DIAGNOSIS — K6389 Other specified diseases of intestine: Secondary | ICD-10-CM | POA: Insufficient documentation

## 2019-03-11 DIAGNOSIS — E1151 Type 2 diabetes mellitus with diabetic peripheral angiopathy without gangrene: Secondary | ICD-10-CM | POA: Diagnosis not present

## 2019-03-11 DIAGNOSIS — K640 First degree hemorrhoids: Secondary | ICD-10-CM | POA: Insufficient documentation

## 2019-03-11 DIAGNOSIS — K5730 Diverticulosis of large intestine without perforation or abscess without bleeding: Secondary | ICD-10-CM | POA: Diagnosis not present

## 2019-03-11 DIAGNOSIS — K2950 Unspecified chronic gastritis without bleeding: Secondary | ICD-10-CM | POA: Insufficient documentation

## 2019-03-11 DIAGNOSIS — Z794 Long term (current) use of insulin: Secondary | ICD-10-CM | POA: Diagnosis not present

## 2019-03-11 DIAGNOSIS — I509 Heart failure, unspecified: Secondary | ICD-10-CM | POA: Insufficient documentation

## 2019-03-11 DIAGNOSIS — Z85818 Personal history of malignant neoplasm of other sites of lip, oral cavity, and pharynx: Secondary | ICD-10-CM | POA: Insufficient documentation

## 2019-03-11 DIAGNOSIS — Z79899 Other long term (current) drug therapy: Secondary | ICD-10-CM | POA: Insufficient documentation

## 2019-03-11 DIAGNOSIS — K529 Noninfective gastroenteritis and colitis, unspecified: Secondary | ICD-10-CM | POA: Insufficient documentation

## 2019-03-11 DIAGNOSIS — I4891 Unspecified atrial fibrillation: Secondary | ICD-10-CM | POA: Insufficient documentation

## 2019-03-11 DIAGNOSIS — R933 Abnormal findings on diagnostic imaging of other parts of digestive tract: Secondary | ICD-10-CM | POA: Insufficient documentation

## 2019-03-11 DIAGNOSIS — Z85828 Personal history of other malignant neoplasm of skin: Secondary | ICD-10-CM | POA: Insufficient documentation

## 2019-03-11 DIAGNOSIS — D123 Benign neoplasm of transverse colon: Secondary | ICD-10-CM | POA: Insufficient documentation

## 2019-03-11 DIAGNOSIS — E1122 Type 2 diabetes mellitus with diabetic chronic kidney disease: Secondary | ICD-10-CM | POA: Insufficient documentation

## 2019-03-11 DIAGNOSIS — D125 Benign neoplasm of sigmoid colon: Secondary | ICD-10-CM | POA: Insufficient documentation

## 2019-03-11 DIAGNOSIS — Z923 Personal history of irradiation: Secondary | ICD-10-CM | POA: Insufficient documentation

## 2019-03-11 DIAGNOSIS — Z951 Presence of aortocoronary bypass graft: Secondary | ICD-10-CM | POA: Insufficient documentation

## 2019-03-11 DIAGNOSIS — K219 Gastro-esophageal reflux disease without esophagitis: Secondary | ICD-10-CM | POA: Diagnosis not present

## 2019-03-11 DIAGNOSIS — I252 Old myocardial infarction: Secondary | ICD-10-CM | POA: Diagnosis not present

## 2019-03-11 DIAGNOSIS — N189 Chronic kidney disease, unspecified: Secondary | ICD-10-CM | POA: Insufficient documentation

## 2019-03-11 HISTORY — PX: COLONOSCOPY WITH PROPOFOL: SHX5780

## 2019-03-11 HISTORY — PX: ESOPHAGOGASTRODUODENOSCOPY (EGD) WITH PROPOFOL: SHX5813

## 2019-03-11 LAB — GLUCOSE, CAPILLARY: Glucose-Capillary: 77 mg/dL (ref 70–99)

## 2019-03-11 SURGERY — ESOPHAGOGASTRODUODENOSCOPY (EGD) WITH PROPOFOL
Anesthesia: General

## 2019-03-11 MED ORDER — PROPOFOL 10 MG/ML IV BOLUS
INTRAVENOUS | Status: DC | PRN
  Administered 2019-03-11 (×15): 20 mg via INTRAVENOUS

## 2019-03-11 MED ORDER — EPHEDRINE SULFATE 50 MG/ML IJ SOLN
INTRAMUSCULAR | Status: DC | PRN
  Administered 2019-03-11 (×2): 5 mg via INTRAVENOUS

## 2019-03-11 MED ORDER — PHENYLEPHRINE HCL (PRESSORS) 10 MG/ML IV SOLN
INTRAVENOUS | Status: DC | PRN
  Administered 2019-03-11 (×5): 100 ug via INTRAVENOUS

## 2019-03-11 MED ORDER — SODIUM CHLORIDE 0.9 % IV SOLN
INTRAVENOUS | Status: DC
  Administered 2019-03-11: 12:00:00 1000 mL via INTRAVENOUS

## 2019-03-11 MED ORDER — LIDOCAINE HCL (CARDIAC) PF 100 MG/5ML IV SOSY
PREFILLED_SYRINGE | INTRAVENOUS | Status: DC | PRN
  Administered 2019-03-11: 100 mg via INTRATRACHEAL

## 2019-03-11 NOTE — H&P (Signed)
Outpatient short stay form Pre-procedure 03/11/2019 11:52 AM Teodoro K. Alice Reichert, M.D.  Primary Physician:  Steele Sizer, M.D.  Reason for visit: Chronic diarrhea, abnormal CT of stomach, personal hx of colon polyps.   History of present illness:  Patient with hx of Menetrier's disease s/p Cetuximab therapy at [facility] has persistent diarrhea, unresponsive to empiric therapy with Xifaxan for presumed bacterial overgrowth. CT of chest on 01/27/2019 showed gastric wall thickening.  Has personal hx of colon polyps as well.     Current Facility-Administered Medications:  .  0.9 %  sodium chloride infusion, , Intravenous, Continuous, [place], Benay Pike, MD  Medications Prior to Admission  Medication Sig Dispense Refill Last Dose  . amLODipine (NORVASC) 5 MG tablet Take 5 mg by mouth daily.     . carvedilol (COREG) 6.25 MG tablet Take 6.25 mg by mouth 2 (two) times daily.      . cilostazol (PLETAL) 100 MG tablet Take 100 mg by mouth 2 (two) times daily.      . Continuous Blood Gluc Sensor (FREESTYLE LIBRE 14 DAY SENSOR) MISC APP 1 SENSOR EVERY 14 DAYS     . Continuous Blood Gluc Sensor (FREESTYLE LIBRE 14 DAY SENSOR) MISC Use 1 each every 14 (fourteen) days     . dicyclomine (BENTYL) 10 MG capsule Take 20 mg by mouth 3 (three) times daily.     . diphenoxylate-atropine (LOMOTIL) 2.5-0.025 MG tablet Take 1 tablet by mouth daily.      Marland Kitchen ELIQUIS 2.5 MG TABS tablet Take 2.5 mg by mouth 2 (two) times daily.      Marland Kitchen ezetimibe (ZETIA) 10 MG tablet Take 10 mg by mouth daily.      . fenofibrate 160 MG tablet Take 160 mg by mouth daily.      . Insulin Degludec (TRESIBA FLEXTOUCH) 200 UNIT/ML SOPN Inject 12 Units into the skin at bedtime.      . Insulin Pen Needle (BD PEN NEEDLE NANO U/F) 32G X 4 MM MISC U ONCE D UTD     . isosorbide mononitrate (IMDUR) 120 MG 24 hr tablet Take 120 mg by mouth daily.     . lansoprazole (PREVACID) 30 MG capsule Take 30 mg by mouth every morning.      Marland Kitchen losartan  (COZAAR) 25 MG tablet Take 25 mg by mouth daily.      Marland Kitchen lovastatin (MEVACOR) 40 MG tablet Take 40 mg by mouth 2 (two) times daily.      . nitroGLYCERIN (NITROSTAT) 0.4 MG SL tablet Place 0.4 mg under the tongue every 5 (five) minutes as needed for chest pain.      . ranolazine (RANEXA) 500 MG 12 hr tablet Take 500 mg by mouth 2 (two) times daily.     . sodium bicarbonate 650 MG tablet Take 1 tablet by mouth daily.      . ticagrelor (BRILINTA) 60 MG TABS tablet Take 60 mg by mouth 2 (two) times daily.         No Known Allergies   Past Medical History:  Diagnosis Date  . Bleeding gastrointestinal 09/03/2018  . 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 Chronic depression 09/03/2018  . CKD (chronic kidney disease) 09/03/2018  . Coronary atherosclerosis 09/03/2018  . Coronary bypass graft mechanical complication 40/3474  . Diabetes mellitus without complication ([place])   . Gastric ulcer with hemorrhage 09/03/2018  . Gastritis 09/03/2018  . GERD (gastroesophageal reflux disease) 09/03/2018  . Heart disease   .  Heart murmur   . IDA (iron deficiency anemia) 09/03/2018  . Low magnesium levels 09/03/2018  . 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 Myocardial infarction ([place]) 2017  . Personal history of radiation therapy 05/30/2018   39 treatments with chemotherapy nasopharyngeal cancer  . 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    Physical Exam  Gen: Alert, oriented. Appears stated age.  HEENT: [place]/AT. PERRLA. Lungs: CTA, no wheezes. CV: RR nl S1, S2. Abd: soft, benign, no masses. BS+ Ext: No edema. Pulses 2+    Planned procedures: Proceed with EGD and  colonoscopy. The patient understands the nature of the planned procedure, indications, risks, alternatives and potential complications including but not limited to bleeding, infection, perforation, damage to internal organs and possible  oversedation/side effects from anesthesia. The patient agrees and gives consent to proceed.  Please refer to procedure notes for findings, recommendations and patient disposition/instructions.     Teodoro K. Alice Reichert, M.D. Gastroenterology 03/11/2019  11:52 AM

## 2019-03-11 NOTE — Anesthesia Post-op Follow-up Note (Signed)
Anesthesia QCDR form completed.        

## 2019-03-11 NOTE — Op Note (Signed)
[facility] Patient Name: Dylan Goodman Procedure Date: 03/11/2019 11:50 AM MRN: [id number] Account #: [id number] Date of Birth: [birth year] Admit Type: Outpatient Age: 68 Room: [facility] ENDO ROOM 3 Gender: Male Note Status: Finalized Procedure:            Upper GI endoscopy Indications:          Abnormal CT of the GI tract, Hx Menetrier's Disease Providers:            Benay Pike. Alice Reichert MD, MD Referring MD:         Bethena Roys. Sowles, MD (Referring MD) Medicines:            Propofol per Anesthesia Complications:        No immediate complications. Procedure:            Pre-Anesthesia Assessment:                       - The risks and benefits of the procedure and the                        sedation options and risks were discussed with the                        patient. All questions were answered and informed                        consent was obtained.                       - Patient identification and proposed procedure were                        verified prior to the procedure by the nurse. The                        procedure was verified in the procedure room.                       - ASA Grade Assessment: III - A patient with severe                        systemic disease.                       - After reviewing the risks and benefits, the patient                        was deemed in satisfactory condition to undergo the                        procedure.                       After obtaining informed consent, the endoscope was                        passed under direct vision. Throughout the procedure,                        the patient's blood pressure, pulse, and oxygen  saturations were monitored continuously. The Endoscope                        was introduced through the mouth, and advanced to the                        third part of duodenum. The upper GI endoscopy was                        accomplished without  difficulty. The patient tolerated                        the procedure well. Findings:      The examined esophagus was normal.      Localized severely congested mucosa was found in the cardia, in the       gastric fundus and in the gastric body. Biopsies were taken with a cold       forceps for histology.      The examined duodenum was normal. Biopsies were taken with a cold       forceps for histology. Impression:           - Normal esophagus.                       - Congestive gastropathy. Biopsied.                       - Normal examined duodenum. Biopsied. Recommendation:       - Await pathology results.                       - Proceed with colonoscopy Procedure Code(s):    --- Professional ---                       [id number], Esophagogastroduodenoscopy, flexible, transoral;                        with biopsy, single or multiple Diagnosis Code(s):    --- Professional ---                       R93.3, Abnormal findings on diagnostic imaging of other                        parts of digestive tract                       K31.89, Other diseases of stomach and duodenum CPT copyright 2019 American Medical Association. All rights reserved. The codes documented in this report are preliminary and upon coder review may  be revised to meet current compliance requirements. Efrain Sella MD, MD 03/11/2019 12:55:14 PM This report has been signed electronically. Number of Addenda: 0 Note Initiated On: 03/11/2019 11:50 AM Estimated Blood Loss: Estimated blood loss: none.      [facility]

## 2019-03-11 NOTE — Anesthesia Preprocedure Evaluation (Addendum)
Anesthesia Evaluation  Patient identified by MRN, date of birth, ID band Patient awake    Reviewed: Allergy & Precautions, H&P , NPO status , reviewed documented beta blocker date and time   Airway Mallampati: II  TM Distance: >3 FB Neck ROM: full  Mouth opening: Limited Mouth Opening  Dental  (+) Caps, Missing   Pulmonary former smoker,     + decreased breath sounds      Cardiovascular hypertension, + CAD, + Past MI, + CABG, + Peripheral Vascular Disease and +CHF  + dysrhythmias Atrial Fibrillation + Valvular Problems/Murmurs  Rhythm:irregular  EF 40%  Recent eval (03/02/2019) for CP by cardiology, now for EGD   Neuro/Psych PSYCHIATRIC DISORDERS Depression    GI/Hepatic PUD, GERD  ,  Endo/Other  diabetes  Renal/GU Renal disease     Musculoskeletal   Abdominal   Peds  Hematology  (+) Blood dyscrasia, anemia ,   Anesthesia Other Findings Past Medical History: 09/03/2018: Bleeding gastrointestinal 05/2008: 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 ([place]) 09/03/2018: Chronic depression 09/03/2018: CKD (chronic kidney disease) 09/03/2018: Coronary atherosclerosis 12/1994: Coronary bypass graft mechanical complication No date: Diabetes mellitus without complication ([place]) 99/37/1696: Gastric ulcer with hemorrhage 09/03/2018: Gastritis 09/03/2018: GERD (gastroesophageal reflux disease) No date: Heart disease No date: Heart murmur 09/03/2018: IDA (iron deficiency anemia) 09/03/2018: Low magnesium levels 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: Myocardial infarction ([place]) 05/30/2018: Personal history of radiation therapy     Comment:  39 treatments with chemotherapy nasopharyngeal cancer No date: 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  Past Surgical History: 2006: CAROTID ENDARTERECTOMY 1996: CORONARY ARTERY BYPASS GRAFT     Comment:  quadruple bypass 10/2010: heart  stent     Comment:  x3 02/2011: left groin aneurism     Comment:  11 coils 08/2009: left leg stents 11/11/2018: LOWER EXTREMITY ANGIOGRAPHY; Left     Comment:  Procedure: LOWER EXTREMITY ANGIOGRAPHY;  Surgeon:               Katha Cabal, MD;  Location: [place] [facility];               Service: Cardiovascular;  Laterality: Left; No date: REVISION OF AORTA BIFEMORAL BYPASS 06/1998: right femo pop bypass     Comment:  11/2001, 08/2003 07/2009: right leg stent  BMI    Body Mass Index: 29.57 kg/m      Reproductive/Obstetrics                           Anesthesia Physical Anesthesia Plan  ASA: IV  Anesthesia Plan: General   Post-op Pain Management:    Induction: Intravenous  PONV Risk Score and Plan: TIVA  Airway Management Planned: Nasal Cannula and Natural Airway  Additional Equipment:   Intra-op Plan:   Post-operative Plan:   Informed Consent: I have reviewed the patients History and Physical, chart, labs and discussed the procedure including the risks, benefits and alternatives for the proposed anesthesia with the patient or authorized representative who has indicated his/her understanding and acceptance.     Dental Advisory Given  Plan Discussed with: CRNA  Anesthesia Plan Comments:         Anesthesia Quick Evaluation

## 2019-03-11 NOTE — Interval H&P Note (Signed)
History and Physical Interval Note:  03/11/2019 11:54 AM  Dylan Goodman  has presented today for surgery, with the diagnosis of Gastric Wall Thickening; Chronic Diarrhea.  The various methods of treatment have been discussed with the patient and family. After consideration of risks, benefits and other options for treatment, the patient has consented to  Procedure(s): ESOPHAGOGASTRODUODENOSCOPY (EGD) WITH PROPOFOL (N/A) COLONOSCOPY WITH PROPOFOL (N/A) as a surgical intervention.  The patient's history has been reviewed, patient examined, no change in status, stable for surgery.  I have reviewed the patient's chart and labs.  Questions were answered to the patient's satisfaction.     [place], [place]

## 2019-03-11 NOTE — Transfer of Care (Signed)
Immediate Anesthesia Transfer of Care Note  Patient: Dylan Goodman  Procedure(s) Performed: ESOPHAGOGASTRODUODENOSCOPY (EGD) WITH PROPOFOL (N/A ) COLONOSCOPY WITH PROPOFOL (N/A )  Patient Location: Endoscopy Unit  Anesthesia Type:General  Level of Consciousness: awake, drowsy and patient cooperative  Airway & Oxygen Therapy: Patient Spontanous Breathing and Patient connected to face mask oxygen  Post-op Assessment: Report given to RN and Post -op Vital signs reviewed and stable  Post vital signs: Reviewed and stable  Last Vitals:  Vitals Value Taken Time  BP 140/93 03/11/19 1334  Temp 36.3 C 03/11/19 1334  Pulse 104 03/11/19 1337  Resp 26 03/11/19 1337  SpO2 96 % 03/11/19 1337  Vitals shown include unvalidated device data.  Last Pain:  Vitals:   03/11/19 1334  TempSrc: Tympanic  PainSc: Asleep         Complications: No apparent anesthesia complications

## 2019-03-11 NOTE — Op Note (Addendum)
[facility] Gastroenterology Patient Name: Dylan Goodman Procedure Date: 03/11/2019 11:49 AM MRN: [id number] Account #: [id number] Date of Birth: July 10, 1951 Admit Type: Outpatient Age: 68 Room: [facility] ENDO ROOM 3 Gender: Male Note Status: Finalized Procedure:            Colonoscopy Indications:          Functional diarrhea Providers:            Benay Pike. [place] MD, MD Medicines:            Propofol per Anesthesia Complications:        No immediate complications. Procedure:            Pre-Anesthesia Assessment:                       - The risks and benefits of the procedure and the                        sedation options and risks were discussed with the                        patient. All questions were answered and informed                        consent was obtained.                       - Patient identification and proposed procedure were                        verified prior to the procedure by the nurse. The                        procedure was verified in the procedure room.                       - ASA Grade Assessment: IV - A patient with severe                        systemic disease that is a constant threat to life.                       - After reviewing the risks and benefits, the patient                        was deemed in satisfactory condition to undergo the                        procedure.                       After obtaining informed consent, the colonoscope was                        passed under direct vision. Throughout the procedure,                        the patient's blood pressure, pulse, and oxygen                        saturations were monitored continuously. The  Colonoscope was introduced through the anus and                        advanced to the the terminal ileum, with identification                        of the appendiceal orifice and IC valve. The                        colonoscopy was performed without  difficulty. The                        patient tolerated the procedure well. The quality of                        the bowel preparation was good. The terminal ileum,                        ileocecal valve, appendiceal orifice, and rectum were                        photographed. Findings:      The perianal and digital rectal examinations were normal. Pertinent       negatives include normal sphincter tone and no palpable rectal lesions.      Many small-mouthed diverticula were found in the left colon.      Two sessile and semi-pedunculated polyps were found in the ascending       colon. The polyps were 4 to 9 mm in size. These polyps were removed with       a cold snare. Resection and retrieval were complete.      The terminal ileum appeared normal. Biopsies were taken with a cold       forceps for histology.      Normal mucosa was found in the entire colon. Biopsies for histology were       taken with a cold forceps from the random colon for evaluation of       microscopic colitis.      Six sessile polyps were found in the proximal transverse colon. The       polyps were 4 to 8 mm in size. These polyps were removed with a cold       snare. Resection and retrieval were complete.      Two sessile polyps were found in the mid transverse colon. The polyps       were 5 to 7 mm in size. These polyps were removed with a cold snare.       Resection and retrieval were complete.      Eight sessile polyps were found in the distal sigmoid colon. The polyps       were 3 to 7 mm in size. These polyps were removed with a cold snare.       Resection and retrieval were complete.      A 2 mm polyp was found in the rectum. The polyp was sessile. The polyp       was removed with a cold biopsy forceps. Resection and retrieval were       complete.      Non-bleeding internal hemorrhoids were found during retroflexion. The       hemorrhoids were Grade I (internal hemorrhoids that do not prolapse).  The  exam was otherwise without abnormality. Impression:           - Diverticulosis in the left colon.                       - Two 4 to 9 mm polyps in the ascending colon, removed                        with a cold snare. Resected and retrieved.                       - The examined portion of the ileum was normal.                        Biopsied.                       - Normal mucosa in the entire examined colon. Biopsied.                       - Six 4 to 8 mm polyps in the proximal transverse                        colon, removed with a cold snare. Resected and                        retrieved.                       - Two 5 to 7 mm polyps in the mid transverse colon,                        removed with a cold snare. Resected and retrieved.                       - Eight 3 to 7 mm polyps in the distal sigmoid colon,                        removed with a cold snare. Resected and retrieved.                       - One 2 mm polyp in the rectum, removed with a cold                        biopsy forceps. Resected and retrieved.                       - Non-bleeding internal hemorrhoids.                       - The examination was otherwise normal. Recommendation:       - Await pathology results from EGD, also performed                        today.                       - Patient has a contact number available for  emergencies. The signs and symptoms of potential                        delayed complications were discussed with the patient.                        Return to normal activities tomorrow. Written discharge                        instructions were provided to the patient.                       - Resume previous diet.                       - Continue present medications.                       - Await pathology results.                       - Repeat colonoscopy date to be determined after                        pending pathology results are reviewed for surveillance.                        - Return to physician assistant in 3 weeks.                       - The findings and recommendations were discussed with                        the patient. Procedure Code(s):    --- Professional ---                       [phone], Colonoscopy, flexible; with removal of tumor(s),                        polyp(s), or other lesion(s) by snare technique                       [id number], 67, Colonoscopy, flexible; with biopsy, single                        or multiple Diagnosis Code(s):    --- Professional ---                       K57.30, Diverticulosis of large intestine without                        perforation or abscess without bleeding                       K59.1, Functional diarrhea                       K62.1, Rectal polyp                       K63.5, Polyp of colon  K64.0, First degree hemorrhoids CPT copyright 2019 American Medical Association. All rights reserved. The codes documented in this report are preliminary and upon coder review may  be revised to meet current compliance requirements. Efrain Sella MD, MD 03/11/2019 1:39:11 PM This report has been signed electronically. Number of Addenda: 0 Note Initiated On: 03/11/2019 11:49 AM Scope Withdrawal Time: 0 hours 30 minutes 18 seconds  Total Procedure Duration: 0 hours 32 minutes 58 seconds  Estimated Blood Loss: Estimated blood loss was minimal.      [facility]

## 2019-03-12 ENCOUNTER — Other Ambulatory Visit: Payer: Self-pay

## 2019-03-12 ENCOUNTER — Emergency Department: Payer: Medicare Other

## 2019-03-12 ENCOUNTER — Inpatient Hospital Stay: Payer: Medicare Other

## 2019-03-12 ENCOUNTER — Ambulatory Visit: Admission: RE | Admit: 2019-03-12 | Payer: Medicare Other | Source: Ambulatory Visit

## 2019-03-12 ENCOUNTER — Observation Stay
Admission: EM | Admit: 2019-03-12 | Discharge: 2019-03-13 | Disposition: A | Discharge status: Home Health | Payer: Medicare Other | Attending: Internal Medicine | Admitting: Internal Medicine

## 2019-03-12 ENCOUNTER — Encounter: Payer: Self-pay | Admitting: Emergency Medicine

## 2019-03-12 DIAGNOSIS — Z20828 Contact with and (suspected) exposure to other viral communicable diseases: Secondary | ICD-10-CM | POA: Diagnosis not present

## 2019-03-12 DIAGNOSIS — I714 Abdominal aortic aneurysm, without rupture: Secondary | ICD-10-CM | POA: Insufficient documentation

## 2019-03-12 DIAGNOSIS — J9601 Acute respiratory failure with hypoxia: Secondary | ICD-10-CM | POA: Diagnosis present

## 2019-03-12 DIAGNOSIS — K219 Gastro-esophageal reflux disease without esophagitis: Secondary | ICD-10-CM | POA: Diagnosis not present

## 2019-03-12 DIAGNOSIS — I4891 Unspecified atrial fibrillation: Secondary | ICD-10-CM | POA: Insufficient documentation

## 2019-03-12 DIAGNOSIS — I2510 Atherosclerotic heart disease of native coronary artery without angina pectoris: Secondary | ICD-10-CM | POA: Diagnosis not present

## 2019-03-12 DIAGNOSIS — R339 Retention of urine, unspecified: Secondary | ICD-10-CM | POA: Insufficient documentation

## 2019-03-12 DIAGNOSIS — E1122 Type 2 diabetes mellitus with diabetic chronic kidney disease: Secondary | ICD-10-CM | POA: Insufficient documentation

## 2019-03-12 DIAGNOSIS — Z955 Presence of coronary angioplasty implant and graft: Secondary | ICD-10-CM | POA: Diagnosis not present

## 2019-03-12 DIAGNOSIS — F329 Major depressive disorder, single episode, unspecified: Secondary | ICD-10-CM | POA: Diagnosis not present

## 2019-03-12 DIAGNOSIS — Z79899 Other long term (current) drug therapy: Secondary | ICD-10-CM | POA: Diagnosis not present

## 2019-03-12 DIAGNOSIS — R1110 Vomiting, unspecified: Secondary | ICD-10-CM | POA: Diagnosis not present

## 2019-03-12 DIAGNOSIS — I6789 Other cerebrovascular disease: Secondary | ICD-10-CM | POA: Diagnosis not present

## 2019-03-12 DIAGNOSIS — J441 Chronic obstructive pulmonary disease with (acute) exacerbation: Principal | ICD-10-CM | POA: Insufficient documentation

## 2019-03-12 DIAGNOSIS — Z87891 Personal history of nicotine dependence: Secondary | ICD-10-CM | POA: Insufficient documentation

## 2019-03-12 DIAGNOSIS — Z794 Long term (current) use of insulin: Secondary | ICD-10-CM | POA: Insufficient documentation

## 2019-03-12 DIAGNOSIS — K5730 Diverticulosis of large intestine without perforation or abscess without bleeding: Secondary | ICD-10-CM | POA: Insufficient documentation

## 2019-03-12 DIAGNOSIS — I6522 Occlusion and stenosis of left carotid artery: Secondary | ICD-10-CM | POA: Insufficient documentation

## 2019-03-12 DIAGNOSIS — R9389 Abnormal findings on diagnostic imaging of other specified body structures: Secondary | ICD-10-CM

## 2019-03-12 DIAGNOSIS — I700 Atherosclerosis of aorta: Secondary | ICD-10-CM | POA: Diagnosis not present

## 2019-03-12 DIAGNOSIS — R42 Dizziness and giddiness: Secondary | ICD-10-CM

## 2019-03-12 DIAGNOSIS — D509 Iron deficiency anemia, unspecified: Secondary | ICD-10-CM | POA: Insufficient documentation

## 2019-03-12 DIAGNOSIS — Z85818 Personal history of malignant neoplasm of other sites of lip, oral cavity, and pharynx: Secondary | ICD-10-CM | POA: Diagnosis not present

## 2019-03-12 DIAGNOSIS — E1151 Type 2 diabetes mellitus with diabetic peripheral angiopathy without gangrene: Secondary | ICD-10-CM | POA: Insufficient documentation

## 2019-03-12 DIAGNOSIS — Z85828 Personal history of other malignant neoplasm of skin: Secondary | ICD-10-CM | POA: Insufficient documentation

## 2019-03-12 DIAGNOSIS — G8929 Other chronic pain: Secondary | ICD-10-CM | POA: Insufficient documentation

## 2019-03-12 DIAGNOSIS — Z951 Presence of aortocoronary bypass graft: Secondary | ICD-10-CM | POA: Insufficient documentation

## 2019-03-12 DIAGNOSIS — I252 Old myocardial infarction: Secondary | ICD-10-CM | POA: Diagnosis not present

## 2019-03-12 DIAGNOSIS — Z7901 Long term (current) use of anticoagulants: Secondary | ICD-10-CM | POA: Insufficient documentation

## 2019-03-12 DIAGNOSIS — Z9221 Personal history of antineoplastic chemotherapy: Secondary | ICD-10-CM | POA: Insufficient documentation

## 2019-03-12 DIAGNOSIS — I130 Hypertensive heart and chronic kidney disease with heart failure and stage 1 through stage 4 chronic kidney disease, or unspecified chronic kidney disease: Secondary | ICD-10-CM | POA: Insufficient documentation

## 2019-03-12 DIAGNOSIS — N184 Chronic kidney disease, stage 4 (severe): Secondary | ICD-10-CM | POA: Diagnosis not present

## 2019-03-12 DIAGNOSIS — K7689 Other specified diseases of liver: Secondary | ICD-10-CM | POA: Insufficient documentation

## 2019-03-12 DIAGNOSIS — K529 Noninfective gastroenteritis and colitis, unspecified: Secondary | ICD-10-CM | POA: Insufficient documentation

## 2019-03-12 DIAGNOSIS — I5022 Chronic systolic (congestive) heart failure: Secondary | ICD-10-CM | POA: Diagnosis not present

## 2019-03-12 DIAGNOSIS — Z7902 Long term (current) use of antithrombotics/antiplatelets: Secondary | ICD-10-CM | POA: Insufficient documentation

## 2019-03-12 DIAGNOSIS — E782 Mixed hyperlipidemia: Secondary | ICD-10-CM | POA: Insufficient documentation

## 2019-03-12 LAB — URINALYSIS, COMPLETE (UACMP) WITH MICROSCOPIC
Bacteria, UA: NONE SEEN
Bilirubin Urine: NEGATIVE
Glucose, UA: NEGATIVE mg/dL
Ketones, ur: NEGATIVE mg/dL
Leukocytes,Ua: NEGATIVE
Nitrite: NEGATIVE
Protein, ur: 30 mg/dL — AB
Specific Gravity, Urine: 1.012 (ref 1.005–1.030)
Squamous Epithelial / HPF: NONE SEEN (ref 0–5)
pH: 5 (ref 5.0–8.0)

## 2019-03-12 LAB — CBC
HCT: 40.1 % (ref 39.0–52.0)
Hemoglobin: 12.9 g/dL — ABNORMAL LOW (ref 13.0–17.0)
MCH: 29.1 pg (ref 26.0–34.0)
MCHC: 32.2 g/dL (ref 30.0–36.0)
MCV: 90.3 fL (ref 80.0–100.0)
Platelets: 192 10*3/uL (ref 150–400)
RBC: 4.44 MIL/uL (ref 4.22–5.81)
RDW: 13.9 % (ref 11.5–15.5)
WBC: 8.6 10*3/uL (ref 4.0–10.5)
nRBC: 0 % (ref 0.0–0.2)

## 2019-03-12 LAB — COMPREHENSIVE METABOLIC PANEL
ALT: 20 U/L (ref 0–44)
AST: 25 U/L (ref 15–41)
Albumin: 3.5 g/dL (ref 3.5–5.0)
Alkaline Phosphatase: 62 U/L (ref 38–126)
Anion gap: 15 (ref 5–15)
BUN: 48 mg/dL — ABNORMAL HIGH (ref 8–23)
CO2: 15 mmol/L — ABNORMAL LOW (ref 22–32)
Calcium: 6.8 mg/dL — ABNORMAL LOW (ref 8.9–10.3)
Chloride: 108 mmol/L (ref 98–111)
Creatinine, Ser: 2.87 mg/dL — ABNORMAL HIGH (ref 0.61–1.24)
GFR calc Af Amer: 25 mL/min — ABNORMAL LOW (ref >60)
GFR calc non Af Amer: 22 mL/min — ABNORMAL LOW (ref >60)
Glucose, Bld: 140 mg/dL — ABNORMAL HIGH (ref 70–99)
Potassium: 3.4 mmol/L — ABNORMAL LOW (ref 3.5–5.1)
Sodium: 138 mmol/L (ref 135–145)
Total Bilirubin: 1 mg/dL (ref 0.3–1.2)
Total Protein: 6.7 g/dL (ref 6.5–8.1)

## 2019-03-12 LAB — GLUCOSE, CAPILLARY
Glucose-Capillary: 122 mg/dL — ABNORMAL HIGH (ref 70–99)
Glucose-Capillary: 111 mg/dL — ABNORMAL HIGH (ref 70–99)
Glucose-Capillary: 148 mg/dL — ABNORMAL HIGH (ref 70–99)
Glucose-Capillary: 150 mg/dL — ABNORMAL HIGH (ref 70–99)

## 2019-03-12 LAB — PROTIME-INR
INR: 1.3 — ABNORMAL HIGH (ref 0.8–1.2)
Prothrombin Time: 16.3 seconds — ABNORMAL HIGH (ref 11.4–15.2)

## 2019-03-12 LAB — SURGICAL PATHOLOGY

## 2019-03-12 LAB — BRAIN NATRIURETIC PEPTIDE: B Natriuretic Peptide: 92 pg/mL (ref 0.0–100.0)

## 2019-03-12 LAB — LIPASE, BLOOD: Lipase: 45 U/L (ref 11–51)

## 2019-03-12 LAB — SARS CORONAVIRUS 2 BY RT PCR (HOSPITAL ORDER, PERFORMED IN ~~LOC~~ HOSPITAL LAB): SARS Coronavirus 2: NEGATIVE

## 2019-03-12 LAB — APTT: aPTT: 36 seconds (ref 24–36)

## 2019-03-12 IMAGING — MR MRI HEAD WITHOUT CONTRAST
10 series · 48 of 48 positions shown · non-contrast
Comparison: None.

CLINICAL DATA: 67-year-old male with persistent vertigo.  Diarrhea.

EXAM:
MRI HEAD WITHOUT CONTRAST
TECHNIQUE: Multiplanar, multiecho pulse sequences of the brain and surrounding
structures were obtained without intravenous contrast.

[Series 2: ax dwi_tracew · axial · 3.0mm · 1.31mm/px · z∈[-74,+80]mm · 6 of 48 slices shown]
[im 1/48]
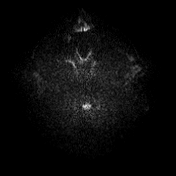
[im 10/48]
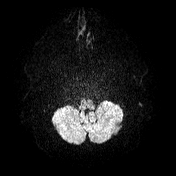
[im 19/48]
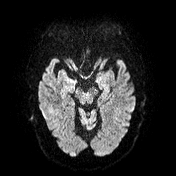
[im 29/48]
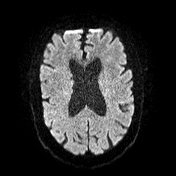
[im 38/48]
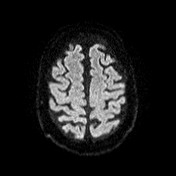
[im 48/48]
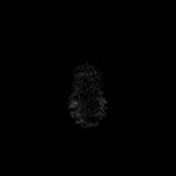

[Series 3: ax dwi_adc · axial · 3.0mm · 1.31mm/px · z∈[-74,+80]mm · 6 of 48 slices shown]
[im 1/48]
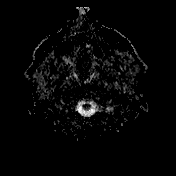
[im 10/48]
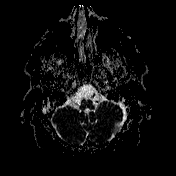
[im 19/48]
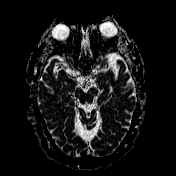
[im 29/48]
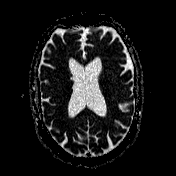
[im 38/48]
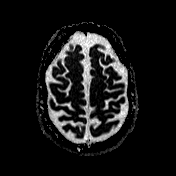
[im 48/48]
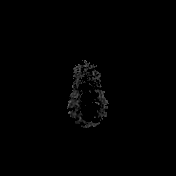

[Series 4: cor dwi_tracew · coronal · 5.0mm · 1.31mm/px · 6 of 42 slices shown]
[im 1/42]
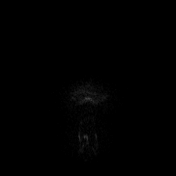
[im 9/42]
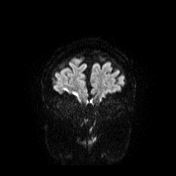
[im 17/42]
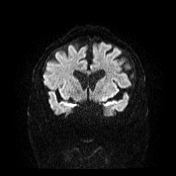
[im 25/42]
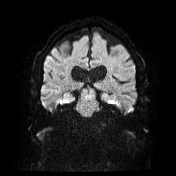
[im 33/42]
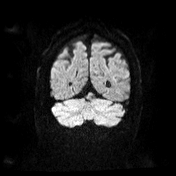
[im 42/42]
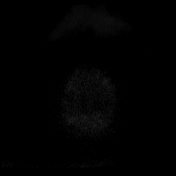

[Series 5: cor dwi_adc · coronal · 5.0mm · 1.31mm/px · 6 of 42 slices shown]
[im 1/42]
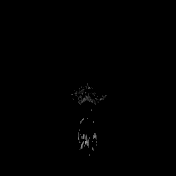
[im 9/42]
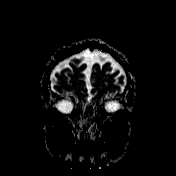
[im 17/42]
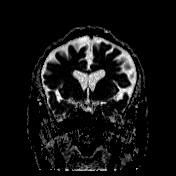
[im 25/42]
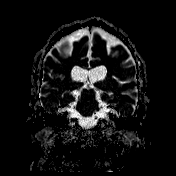
[im 33/42]
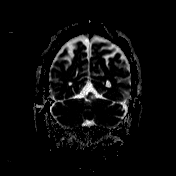
[im 42/42]
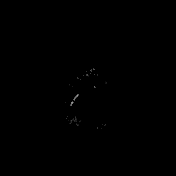

[Series 6: T1 · sagittal · 5.0mm · 0.94mm/px · 3 of 23 slices shown (1 of 2)]
[im 1/23]
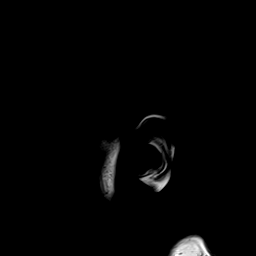
[im 12/23]
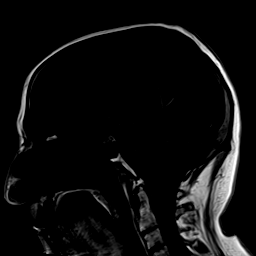
[im 23/23]
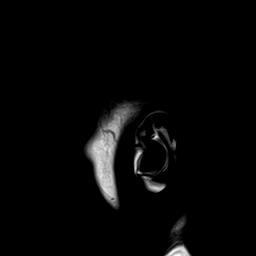

[Series 7: T2 · axial · 5.0mm · 0.45mm/px · z∈[-75,+80]mm · 4 of 27 slices shown (1 of 2)]
[im 1/27]
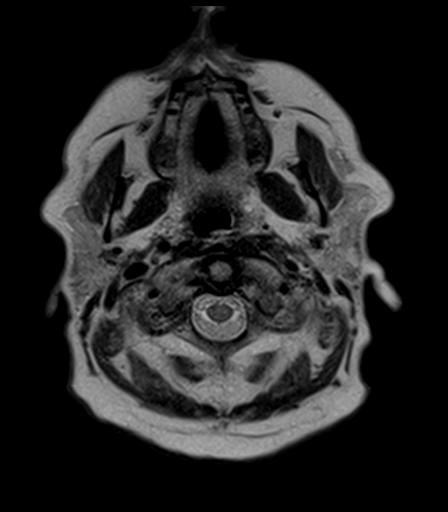
[im 9/27]
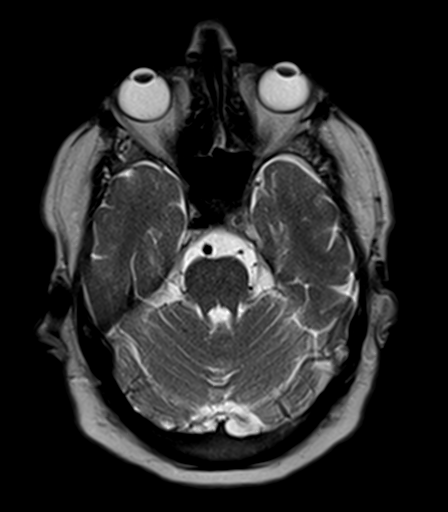
[im 18/27]
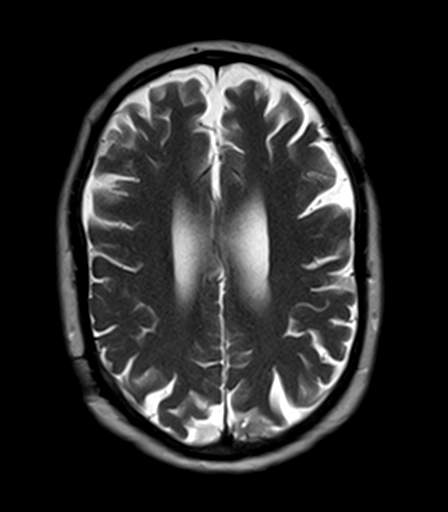
[im 27/27]
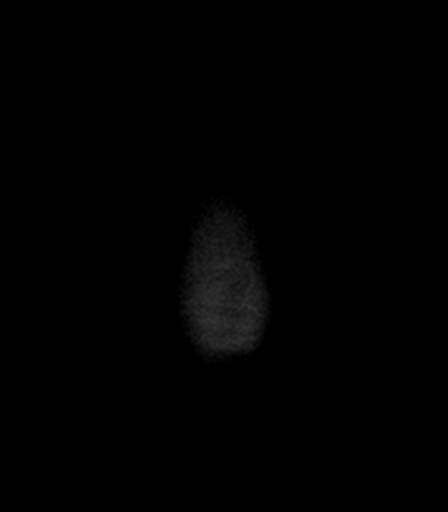

[Series 8: T2-star · axial · 5.0mm · 0.45mm/px · z∈[-75,+80]mm · 4 of 27 slices shown]
[im 1/27]
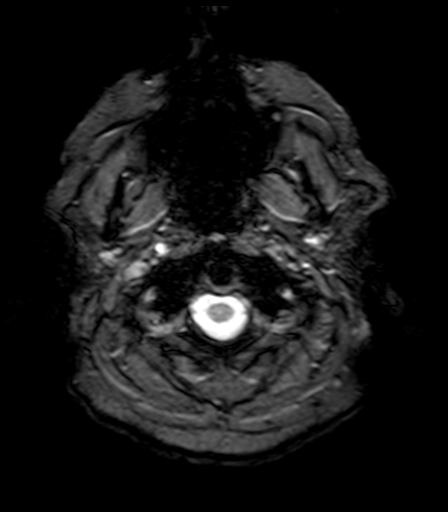
[im 9/27]
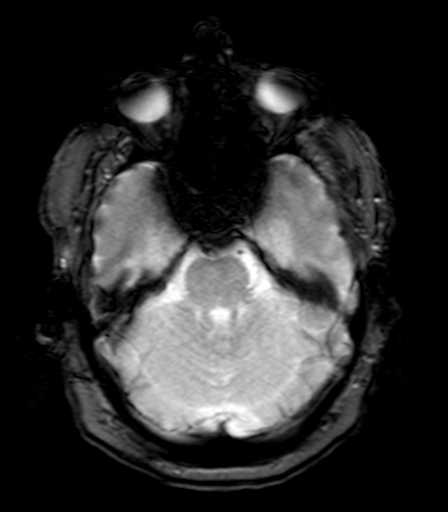
[im 18/27]
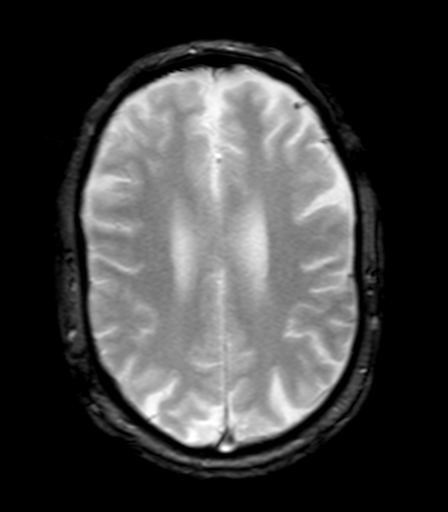
[im 27/27]
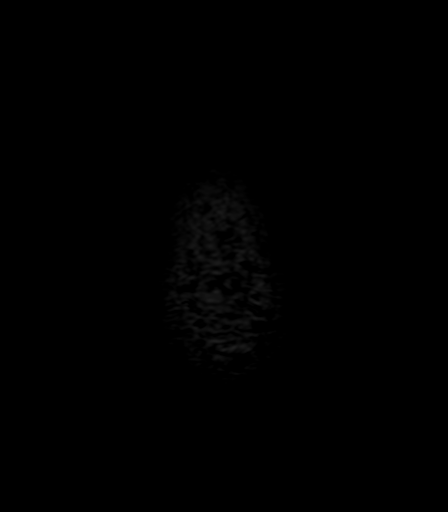

[Series 9: FLAIR · axial · 5.0mm · 1.20mm/px · z∈[-75,+80]mm · 4 of 27 slices shown]
[im 1/27]
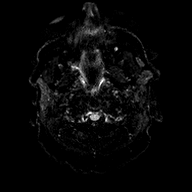
[im 9/27]
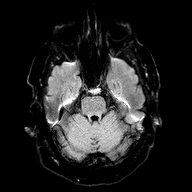
[im 18/27]
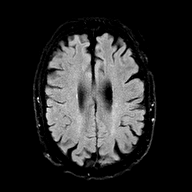
[im 27/27]
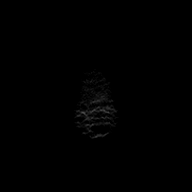

[Series 10: T1 · axial · 5.0mm · 0.90mm/px · z∈[-75,+80]mm · 4 of 27 slices shown (2 of 2)]
[im 1/27]
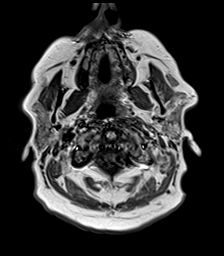
[im 9/27]
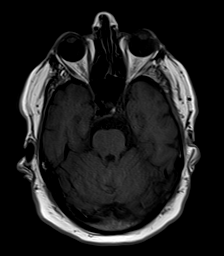
[im 18/27]
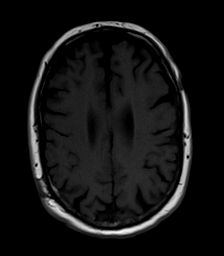
[im 27/27]
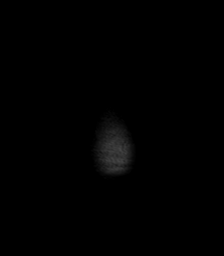

[Series 11: T2 · coronal · 5.0mm · 0.45mm/px · 5 of 35 slices shown (2 of 2)]
[im 1/35]
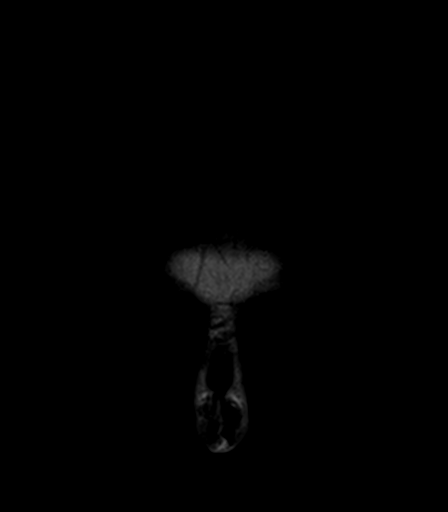
[im 9/35]
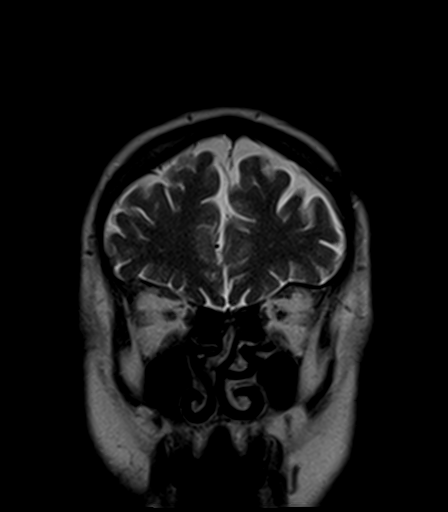
[im 18/35]
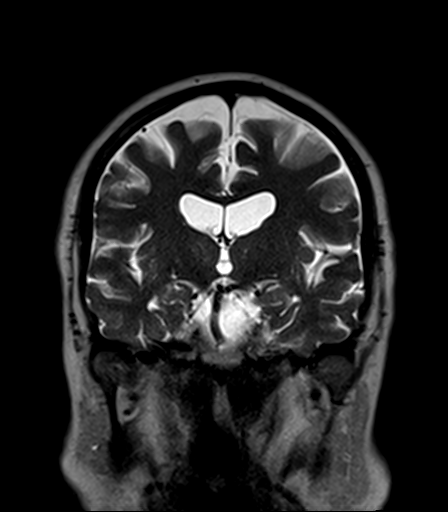
[im 26/35]
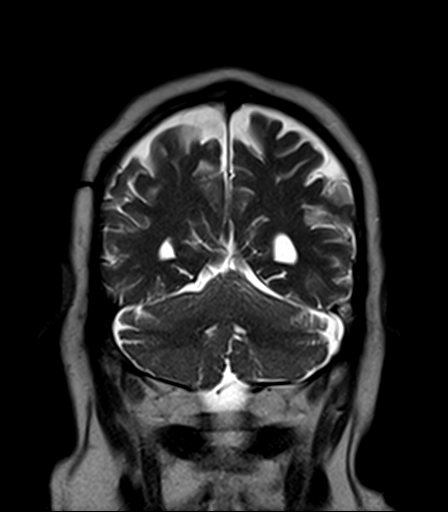
[im 35/35]
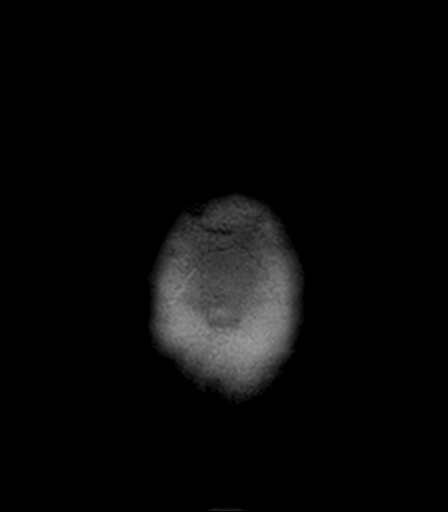

[48 of 48 positions shown; findings below may reference images not displayed]

FINDINGS: Brain: No restricted diffusion to suggest acute infarction. No
midline shift, mass effect, evidence of mass lesion,
ventriculomegaly, extra-axial collection or acute intracranial
hemorrhage. Cervicomedullary junction and pituitary are within
normal limits.

No cortical encephalomalacia identified. However, there may be mild
superficial siderosis of the left superior frontal gyrus on series
8, image 22. No other chronic blood products identified though.

Cerebral white matter signal is normal for age. There are small
chronic lacunar infarcts of the bilateral caudate greater on the
right (series 7, image 16). Negative other deep gray nuclei,
brainstem and cerebellum.

Vascular: The left ICA siphon flow void is asymmetrically diminished
(series 7, images 7 and 8) with evidence of reconstituted flow at
the left ICA terminus. Other Major intracranial vascular flow voids
are preserved.

Skull and upper cervical spine: Negative visible cervical spine.
Visualized bone marrow signal is within normal limits.

Sinuses/Orbits: Negative orbits.  Paranasal sinuses are clear.

Other: Grossly normal visible internal auditory structures. Mild
right inferior mastoid fluid. Small volume retained secretions in
the nasopharynx. Scalp and face soft tissues appear negative.
IMPRESSION: 1. Evidence of poor flow in the Left ICA siphon such as due to
high-grade stenosis of the Left ICA in the neck. Carotid Doppler or
CTA should evaluate further.
2.  No acute intracranial abnormality.
3. Mild for age chronic small vessel disease in the caudate nuclei.
4. Possible mild superficial siderosis of the left superior frontal
gyrus, such as due to a prior subarachnoid hemorrhage.

#1 will be called to the ordering clinician or representative by the
Radiologist Assistant, and communication documented in the PACS or
zVision Dashboard.

## 2019-03-12 IMAGING — CT CT ABDOMEN AND PELVIS WITHOUT CONTRAST
2 of 4 series · 16 of 46 positions shown, 18 images · non-contrast
Comparison: None.

CLINICAL DATA: Nausea vomiting post EGD.

EXAM:
CT ABDOMEN AND PELVIS WITHOUT CONTRAST
TECHNIQUE: Multidetector CT imaging of the abdomen and pelvis was performed
following the standard protocol without IV contrast.

[Series 2: routine abd/pel wo · axial · 0.85mm/px · z∈[-553,-83]mm · 13 of 104 slices shown, 15 images]
[im 5/104  soft-tissue]
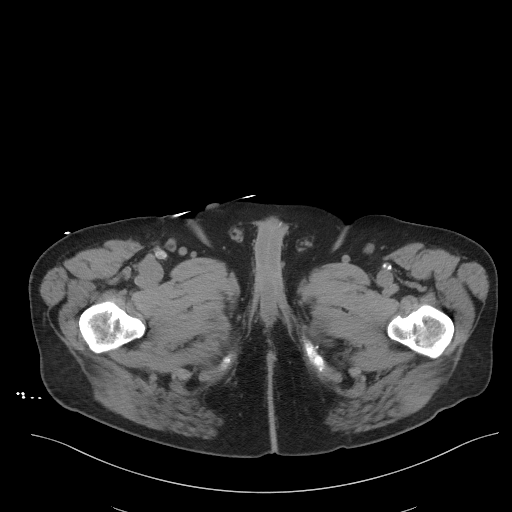
[im 5/104  bone]
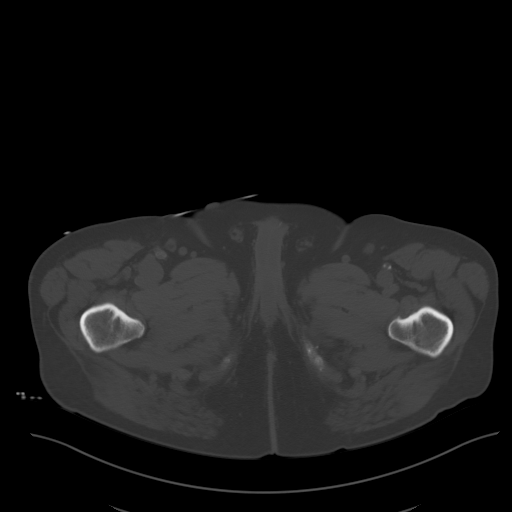
[im 13/104  soft-tissue]
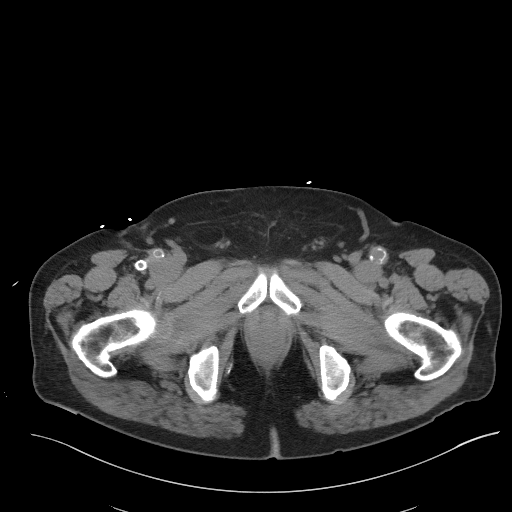
[im 21/104  soft-tissue]
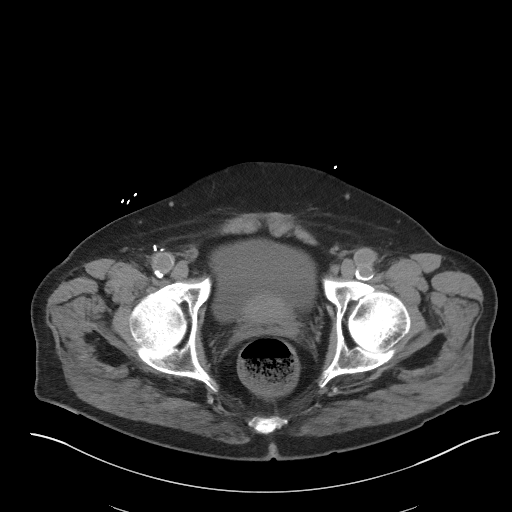
[im 29/104  soft-tissue]
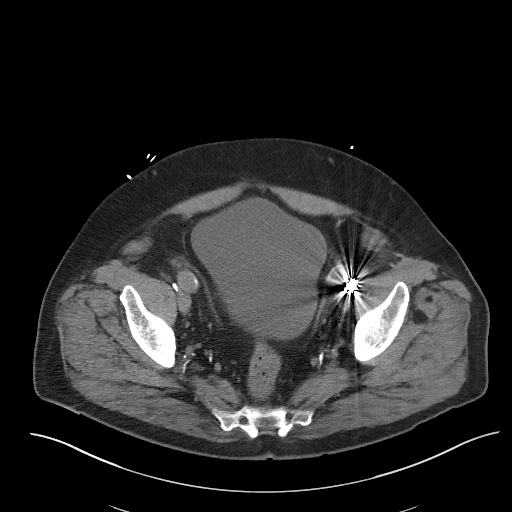
[im 38/104  soft-tissue]
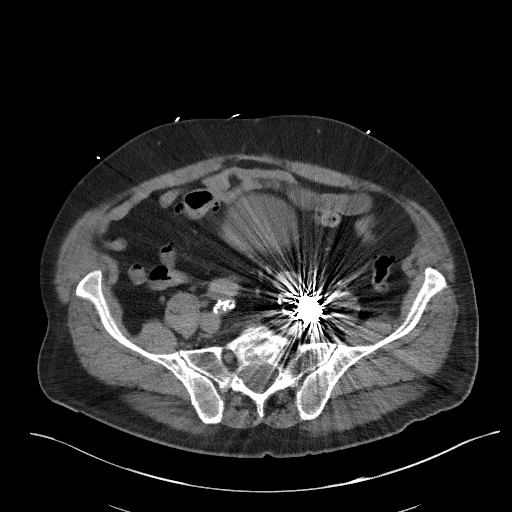
[im 46/104  soft-tissue]
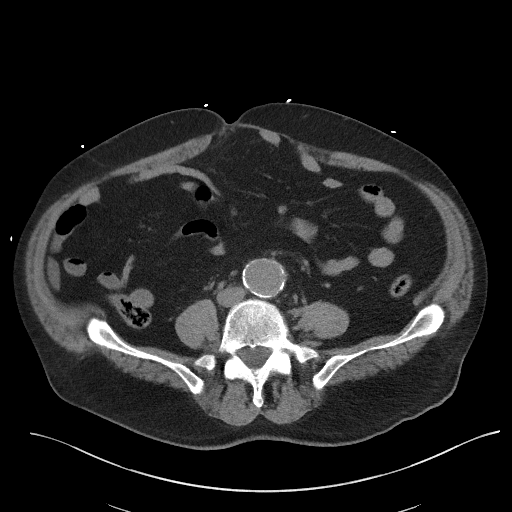
[im 54/104  soft-tissue]
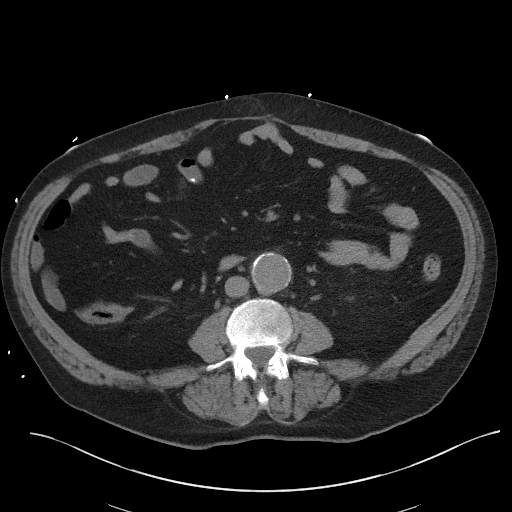
[im 58/104  soft-tissue]
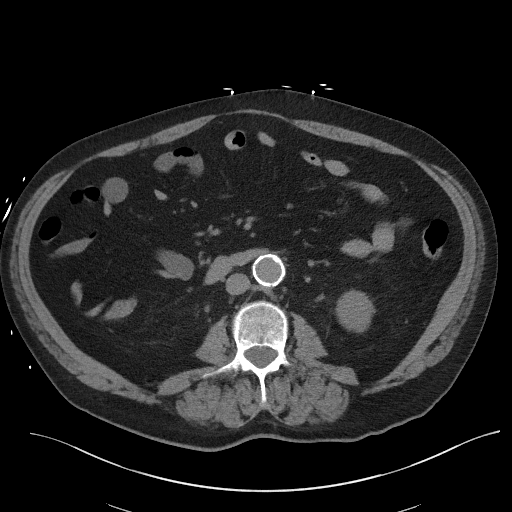
[im 66/104  soft-tissue]
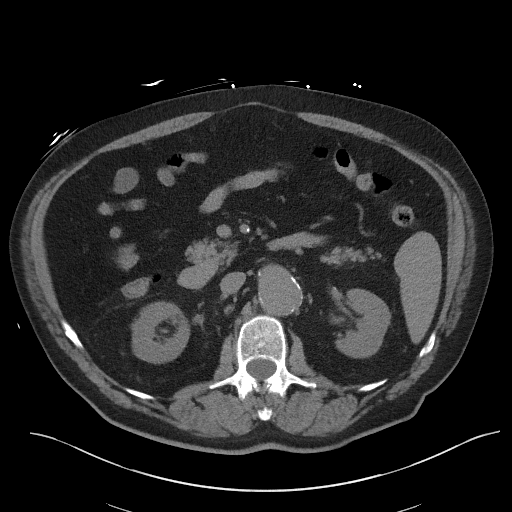
[im 66/104  bone]
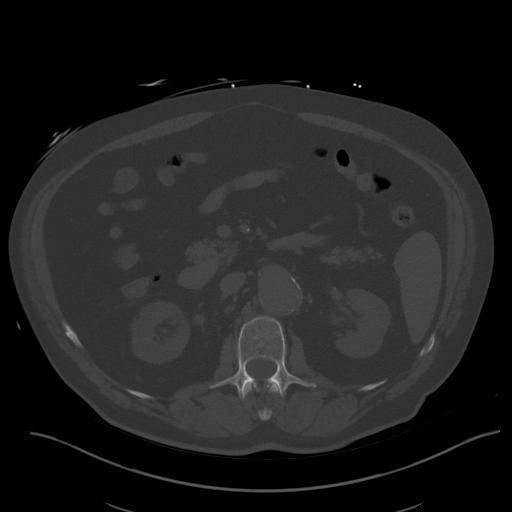
[im 75/104  soft-tissue]
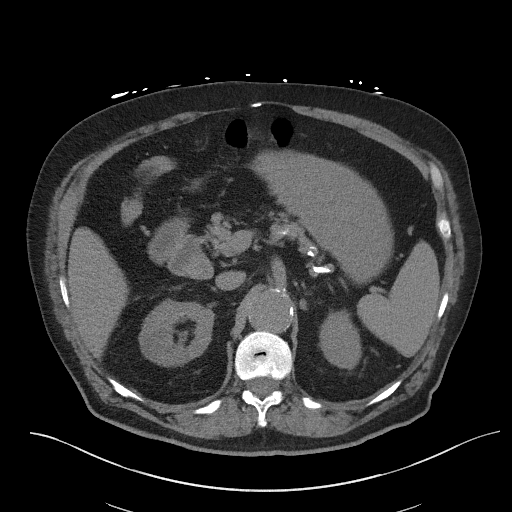
[im 83/104  soft-tissue]
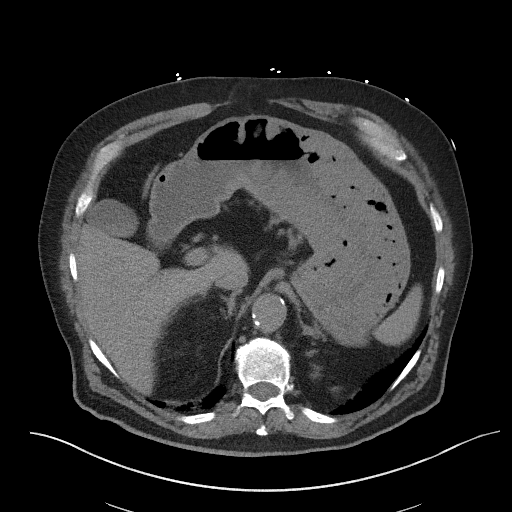
[im 91/104  soft-tissue]
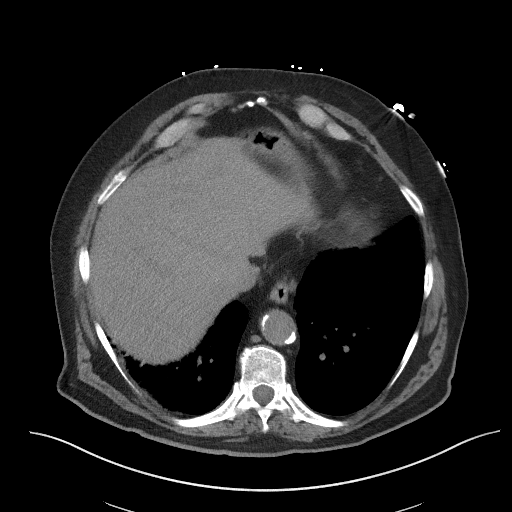
[im 99/104  soft-tissue]
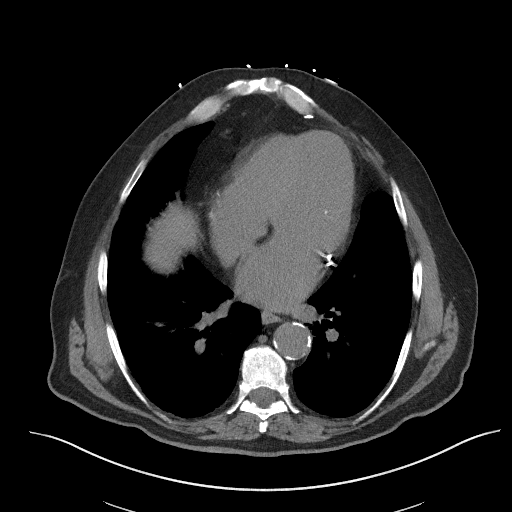

[Series 5: coronal st · coronal · 0.85mm/px · 3 of 114 slices shown]
[im 38/114  soft-tissue]
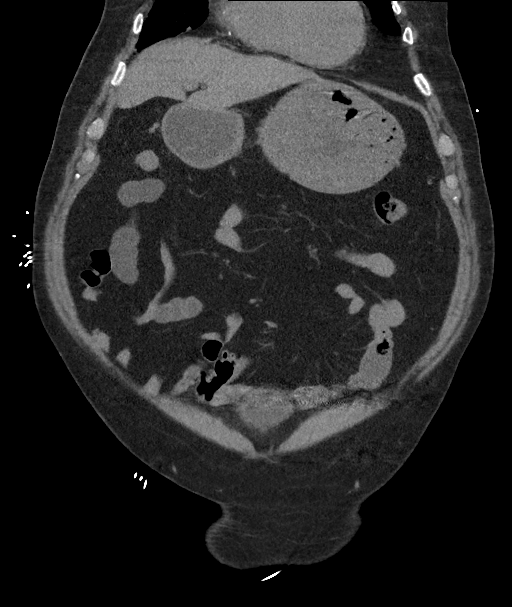
[im 51/114  soft-tissue]
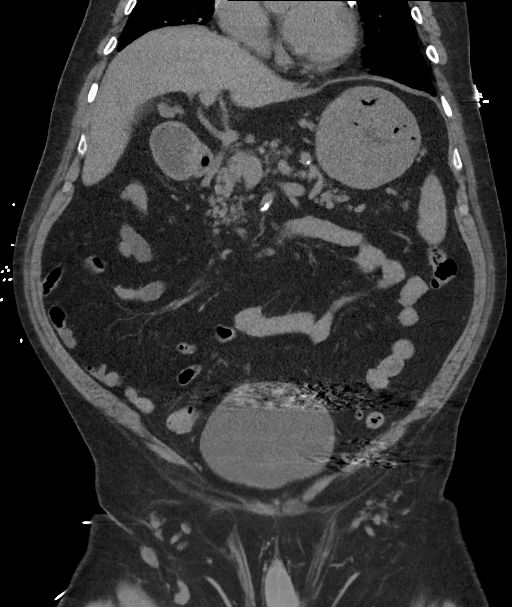
[im 63/114  soft-tissue]
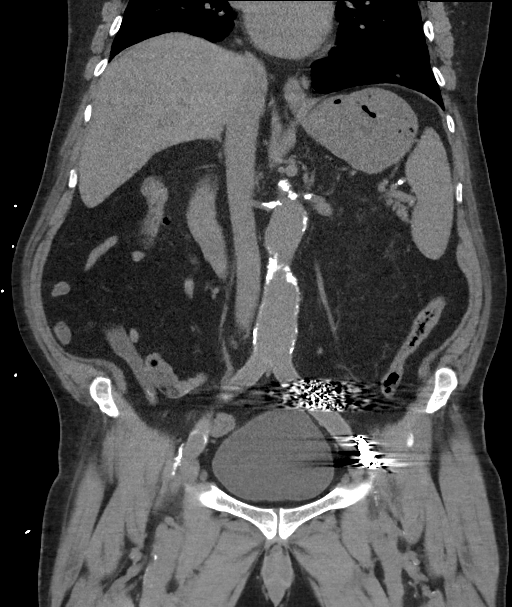

[16 of 46 positions shown; findings below may reference images not displayed]

FINDINGS: Lower chest: Calcific atherosclerotic disease of the coronary
arteries and aorta.

Hepatobiliary: 1.8 cm benign-appearing cyst in the dome of the
liver.

Pancreas: Unremarkable. No pancreatic ductal dilatation or
surrounding inflammatory changes.

Spleen: Normal in size without focal abnormality.

Adrenals/Urinary Tract: Adrenal glands are unremarkable. Kidneys are
normal, without renal calculi, focal lesion, or hydronephrosis.
Bladder is unremarkable.

Stomach/Bowel: Diffuse thickening of the wall of the gastric body
and cardia, with multiple gas bubbles associated with the thickened
wall. No extraluminal gas. The stomach is not opacified and
therefore the internal architecture of the gastric wall could not be
evaluated. No evidence of small-bowel obstruction. Diffuse colonic
diverticulosis without evidence of diverticulitis.

Vascular/Lymphatic: Advanced atherosclerotic disease with tortuosity
of the aorta. Fusiform infrarenal abdominal aortic aneurysm which
measures 4 cm. Additional saccular aneurysm of the distal abdominal
aorta which measures 3.8 cm, image 49/104, sequence 2. No enlarged
abdominal or pelvic lymph nodes.

Reproductive: Enlargement of the prostate gland.

Other: Coil artifact associated with the left iliac vessels.

Musculoskeletal: Spondylosis of the spine.
IMPRESSION: 1. Diffuse thickening of the wall of the gastric body and cardia,
with multiple gas bubbles associated with the thickened wall. The
stomach is not opacified and therefore the internal architecture of
the gastric wall could not be evaluated. No extraluminal gas.
2. Advanced atherosclerotic disease of the aorta and coronary
arteries.
3. Fusiform infrarenal abdominal aortic aneurysm which measures 4
cm. Additional saccular aneurysm of the distal abdominal aorta which
measures 3.8 cm. Evaluation with CTA or MRA of the abdomen may be
considered, if found clinically appropriate.
4. Enlargement of the prostate gland. Please correlate to serum PSA
values.

## 2019-03-12 IMAGING — CR CHEST - 2 VIEW
2 series · 2 of 2 positions shown · non-contrast
Comparison: None.

CLINICAL DATA: Nausea and vomiting post upper endoscopy. Possible
aspiration.

EXAM:
CHEST - 2 VIEW

[chest lat]
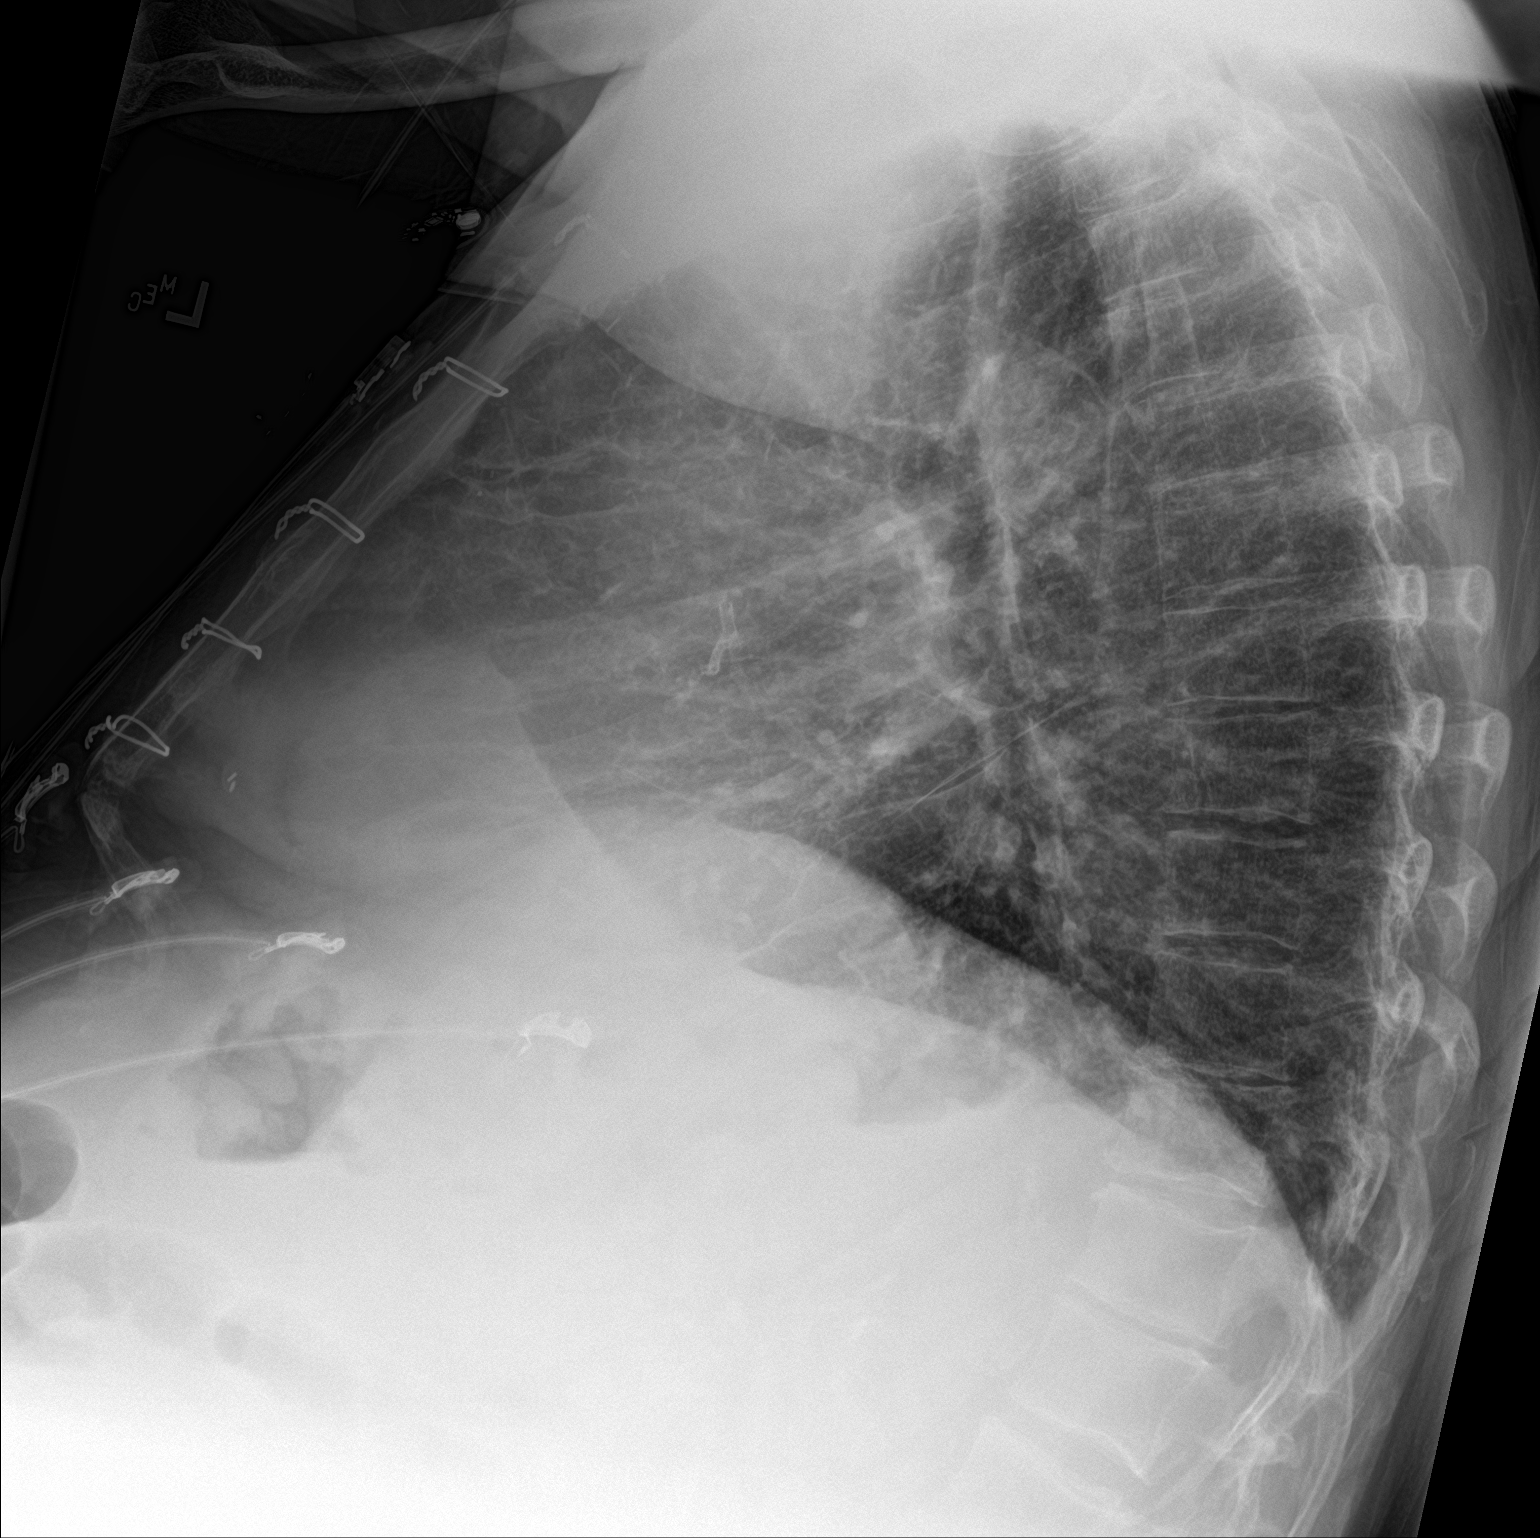

[chest ap]
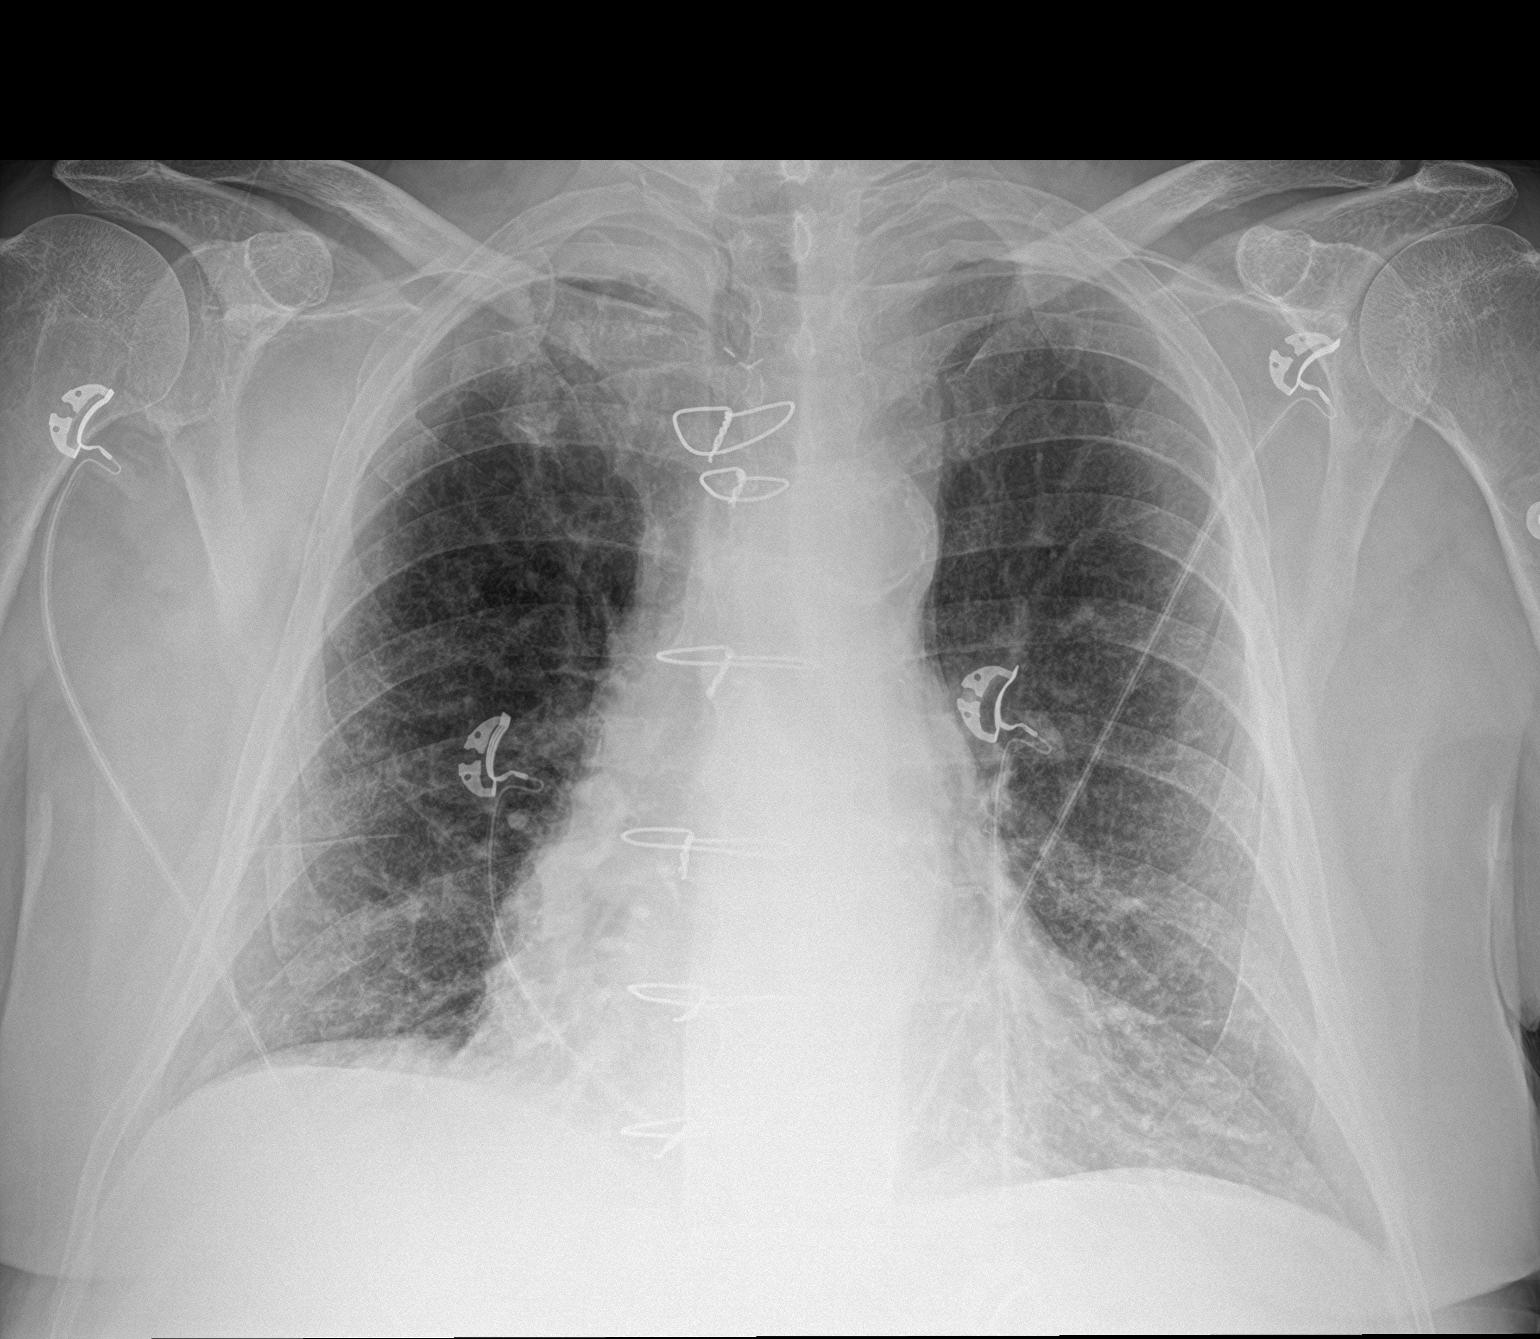

[2 of 2 positions shown; findings below may reference images not displayed]

FINDINGS: Mild cardiomegaly. Aortic atherosclerosis. Mild hyperinflation in
diffuse pulmonary interstitial prominence, consistent with COPD. No
evidence of pulmonary airspace disease or pleural effusion. Prior
CABG noted.
IMPRESSION: Mild cardiomegaly and COPD. No active lung disease.

## 2019-03-12 IMAGING — MR MRI OF THE LEFT HIP WITHOUT CONTRAST
5 series · 40 of 40 positions shown · non-contrast
Comparison: CT [DATE]

CLINICAL DATA: Chronic hip pain, tendonitis

EXAM:
MR OF THE LEFT HIP WITHOUT CONTRAST
TECHNIQUE: Multiplanar, multisequence MR imaging was performed. No intravenous
contrast was administered.

[Series 8: STIR · coronal · left · 4.0mm · 1.56mm/px · 9 of 39 slices shown]
[im 1/39]
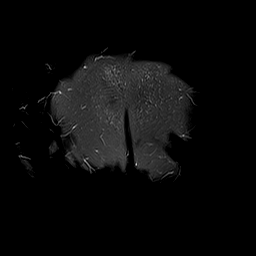
[im 5/39]
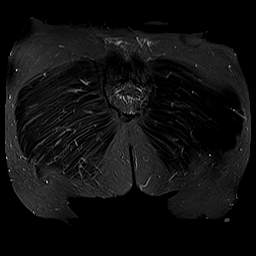
[im 10/39]
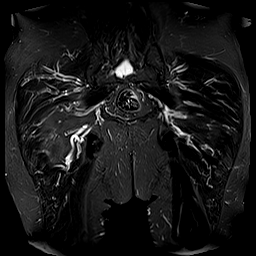
[im 15/39]
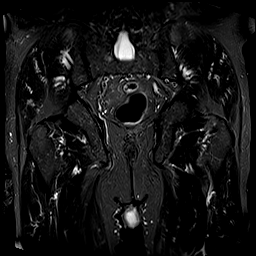
[im 20/39]
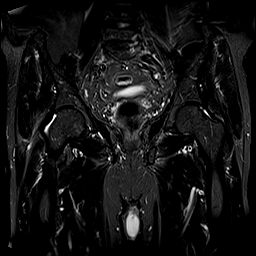
[im 24/39]
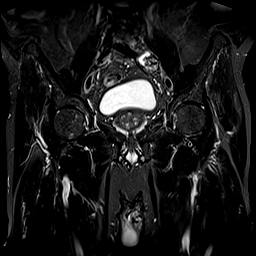
[im 29/39]
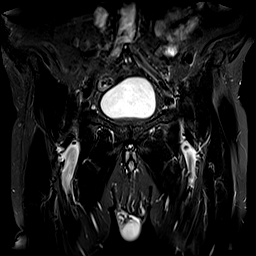
[im 34/39]
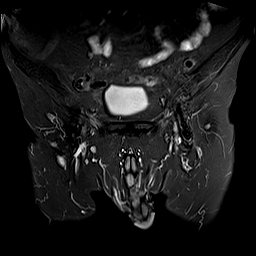
[im 39/39]
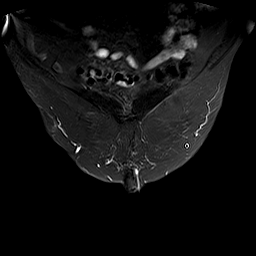

[Series 9: T1 · coronal · left · 4.0mm · 1.56mm/px · 9 of 39 slices shown]
[im 1/39]
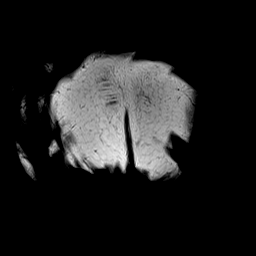
[im 5/39]
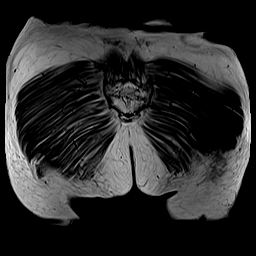
[im 10/39]
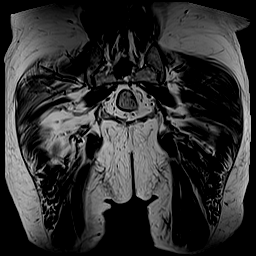
[im 15/39]
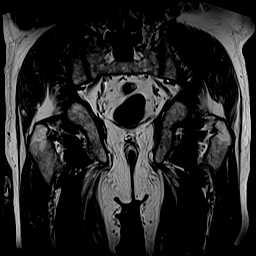
[im 20/39]
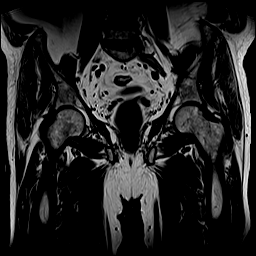
[im 24/39]
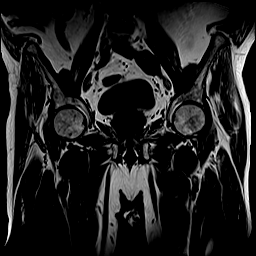
[im 29/39]
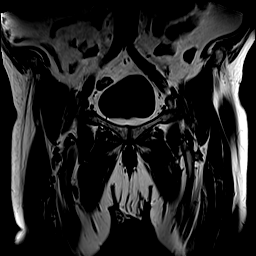
[im 34/39]
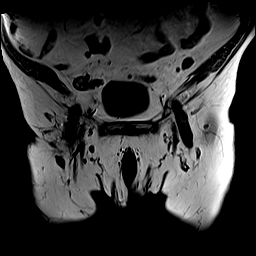
[im 39/39]
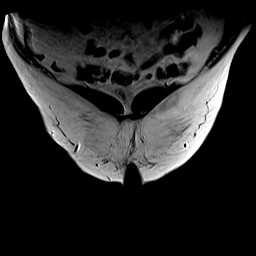

[Series 10: T2 fat-sat · axial · left · 4.0mm · 0.35mm/px · z∈[-57,+88]mm · 7 of 30 slices shown]
[im 1/30]
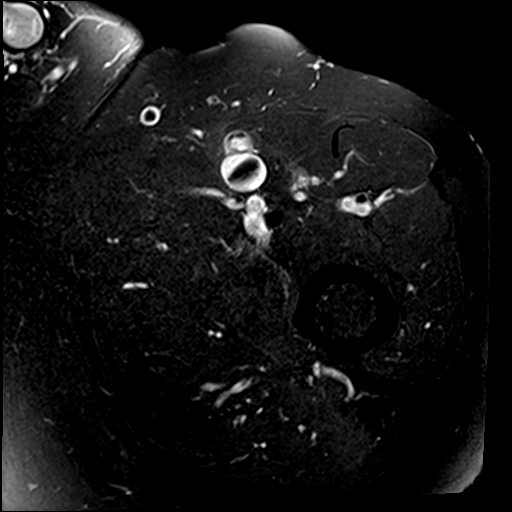
[im 5/30]
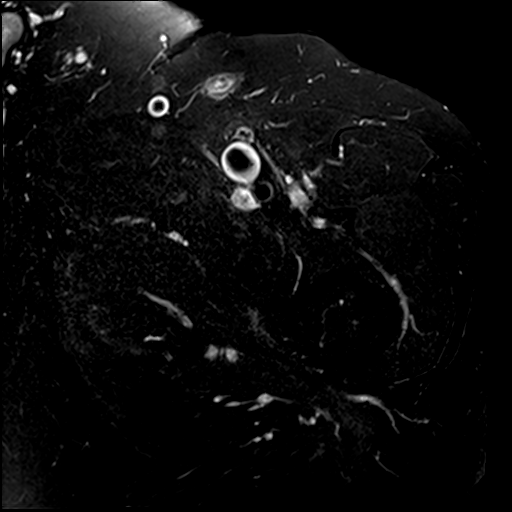
[im 10/30]
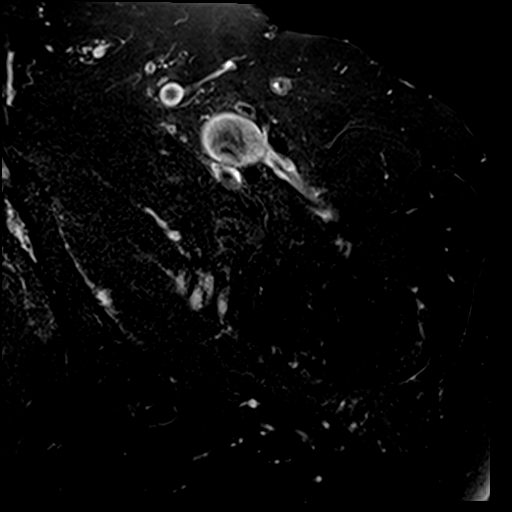
[im 15/30]
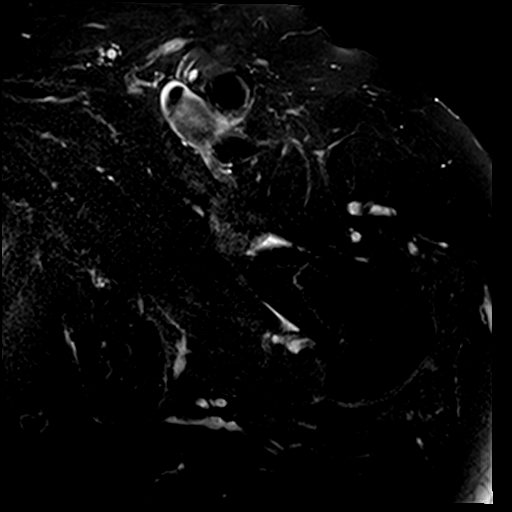
[im 20/30]
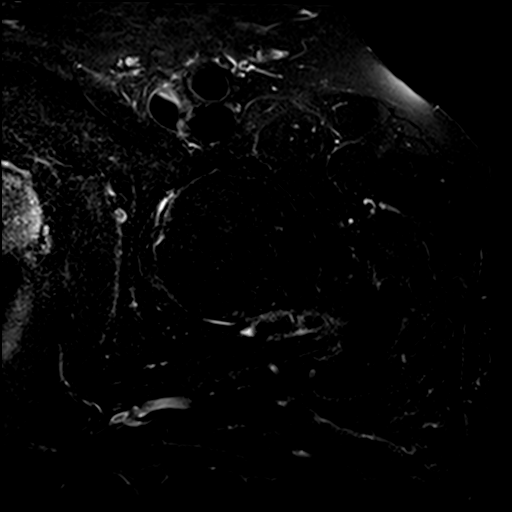
[im 25/30]
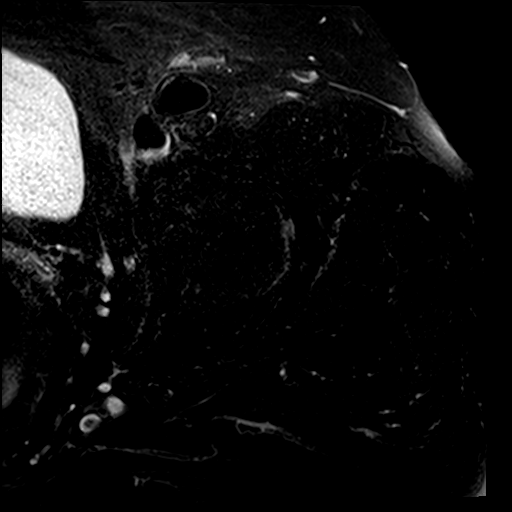
[im 30/30]
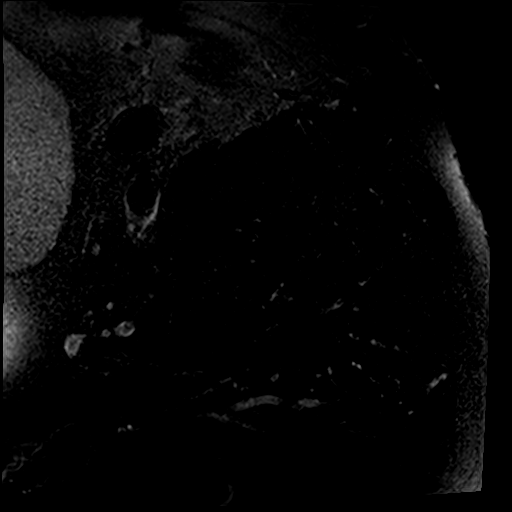

[Series 11: PD fat-sat · sagittal · left · 4.0mm · 0.78mm/px · 8 of 33 slices shown (1 of 2)]
[im 1/33]
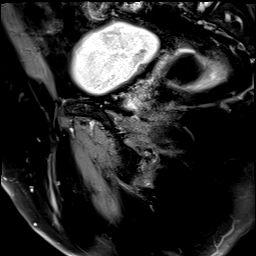
[im 5/33]
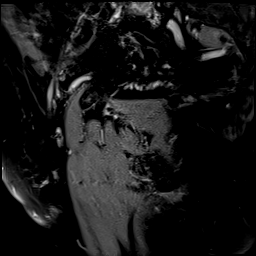
[im 10/33]
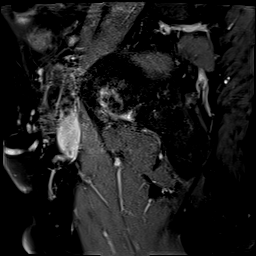
[im 14/33]
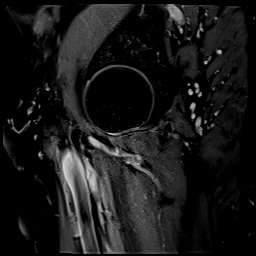
[im 19/33]
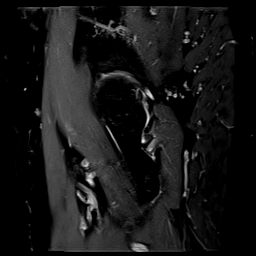
[im 23/33]
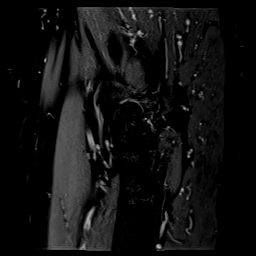
[im 28/33]
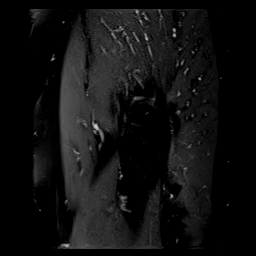
[im 33/33]
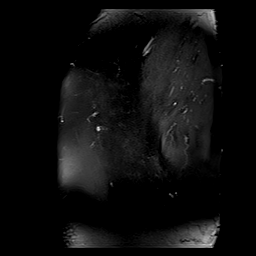

[Series 12: PD fat-sat · coronal · left · 4.0mm · 0.78mm/px · 7 of 29 slices shown (2 of 2)]
[im 1/29]
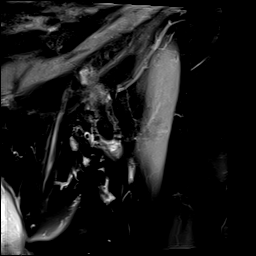
[im 5/29]
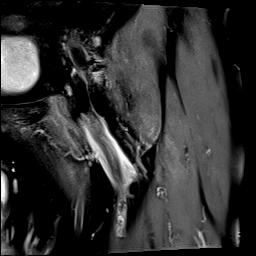
[im 10/29]
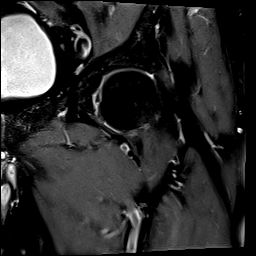
[im 15/29]
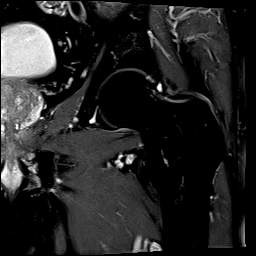
[im 19/29]
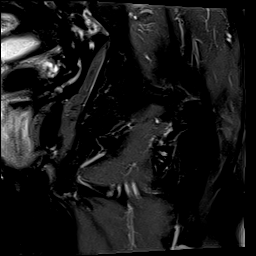
[im 24/29]
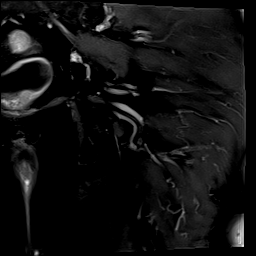
[im 29/29]
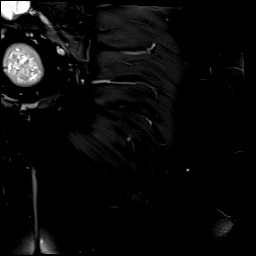

[40 of 40 positions shown; findings below may reference images not displayed]

FINDINGS: Bones: There is no evidence of acute fracture, dislocation or
avascular necrosis. The visualized bony pelvis appears normal. The
visualized sacroiliac joints and symphysis pubis appear normal.

Articular cartilage and labrum

Articular cartilage: There is mild chondral thinning seen at the
superior femoroacetabular joint.

Labrum: There is no gross labral tear or paralabral abnormality.

Joint or bursal effusion

Joint effusion: No significant hip joint effusion.

Bursae: No focal periarticular fluid collection.

Muscles and tendons

Muscles and tendons: There is mild gluteal insertional tendinosis.
The remainder of the tendons are intact. The piriformis muscles
appear symmetric.

Other findings

Miscellaneous: The visualized internal pelvic contents appear
unremarkable. Degenerative changes are seen in the lower lumbar
spine.
IMPRESSION: Mild femoroacetabular joint osteoarthritis.

Mild insertional gluteal tendinosis.

## 2019-03-12 MED ORDER — PANTOPRAZOLE SODIUM 20 MG PO TBEC
20.0000 mg | DELAYED_RELEASE_TABLET | Freq: Every day | ORAL | Status: DC
  Administered 2019-03-12 – 2019-03-13 (×2): 20 mg via ORAL
  Filled 2019-03-12 (×2): qty 1

## 2019-03-12 MED ORDER — CARVEDILOL 3.125 MG PO TABS
6.2500 mg | ORAL_TABLET | Freq: Two times a day (BID) | ORAL | Status: DC
  Administered 2019-03-12 – 2019-03-13 (×2): 6.25 mg via ORAL
  Filled 2019-03-12 (×2): qty 2

## 2019-03-12 MED ORDER — ACETAMINOPHEN 325 MG PO TABS: 650.0000 mg | ORAL_TABLET | Freq: Four times a day (QID) | ORAL | Status: DC | PRN

## 2019-03-12 MED ORDER — ONDANSETRON HCL 4 MG/2ML IJ SOLN
4.0000 mg | Freq: Once | INTRAMUSCULAR | Status: AC
  Administered 2019-03-12: 4 mg via INTRAVENOUS
  Filled 2019-03-12: qty 2

## 2019-03-12 MED ORDER — ACETAMINOPHEN 650 MG RE SUPP: 650.0000 mg | Freq: Four times a day (QID) | RECTAL | Status: DC | PRN

## 2019-03-12 MED ORDER — POTASSIUM CHLORIDE CRYS ER 20 MEQ PO TBCR
40.0000 meq | EXTENDED_RELEASE_TABLET | Freq: Once | ORAL | Status: AC
  Administered 2019-03-12: 40 meq via ORAL
  Filled 2019-03-12: qty 4

## 2019-03-12 MED ORDER — ONDANSETRON HCL 4 MG/2ML IJ SOLN: 4.0000 mg | Freq: Four times a day (QID) | INTRAMUSCULAR | Status: DC | PRN

## 2019-03-12 MED ORDER — METHYLPREDNISOLONE SODIUM SUCC 125 MG IJ SOLR
60.0000 mg | Freq: Once | INTRAMUSCULAR | Status: AC
  Administered 2019-03-12: 11:00:00 60 mg via INTRAVENOUS
  Filled 2019-03-12: qty 2

## 2019-03-12 MED ORDER — PRAVASTATIN SODIUM 20 MG PO TABS
40.0000 mg | ORAL_TABLET | Freq: Every day | ORAL | Status: DC
  Administered 2019-03-12: 18:00:00 40 mg via ORAL
  Filled 2019-03-12: qty 2

## 2019-03-12 MED ORDER — INSULIN ASPART 100 UNIT/ML ~~LOC~~ SOLN
0.0000 [IU] | Freq: Three times a day (TID) | SUBCUTANEOUS | Status: DC
  Administered 2019-03-12 – 2019-03-13 (×2): 1 [IU] via SUBCUTANEOUS
  Filled 2019-03-12 (×2): qty 1

## 2019-03-12 MED ORDER — SODIUM CHLORIDE 0.9% FLUSH: 3.0000 mL | Freq: Once | INTRAVENOUS | Status: DC

## 2019-03-12 MED ORDER — SODIUM CHLORIDE 0.9 % IV BOLUS
250.0000 mL | Freq: Once | INTRAVENOUS | Status: AC
  Administered 2019-03-12: 10:00:00 250 mL via INTRAVENOUS

## 2019-03-12 MED ORDER — INSULIN ASPART 100 UNIT/ML ~~LOC~~ SOLN: 0.0000 [IU] | Freq: Every day | SUBCUTANEOUS | Status: DC

## 2019-03-12 MED ORDER — ISOSORBIDE MONONITRATE ER 30 MG PO TB24
120.0000 mg | ORAL_TABLET | Freq: Every day | ORAL | Status: DC
  Administered 2019-03-12 – 2019-03-13 (×2): 120 mg via ORAL
  Filled 2019-03-12 (×2): qty 4

## 2019-03-12 MED ORDER — IPRATROPIUM-ALBUTEROL 0.5-2.5 (3) MG/3ML IN SOLN
3.0000 mL | Freq: Once | RESPIRATORY_TRACT | Status: AC
  Administered 2019-03-12: 10:00:00 3 mL via RESPIRATORY_TRACT
  Filled 2019-03-12: qty 3

## 2019-03-12 MED ORDER — FENOFIBRATE 160 MG PO TABS
160.0000 mg | ORAL_TABLET | Freq: Every day | ORAL | Status: DC
  Administered 2019-03-12 – 2019-03-13 (×2): 160 mg via ORAL
  Filled 2019-03-12 (×2): qty 1

## 2019-03-12 MED ORDER — ONDANSETRON HCL 4 MG PO TABS: 4.0000 mg | ORAL_TABLET | Freq: Four times a day (QID) | ORAL | Status: DC | PRN

## 2019-03-12 MED ORDER — PROMETHAZINE HCL 25 MG/ML IJ SOLN: 12.5000 mg | Freq: Four times a day (QID) | INTRAMUSCULAR | Status: DC | PRN

## 2019-03-12 MED ORDER — APIXABAN 2.5 MG PO TABS
2.5000 mg | ORAL_TABLET | Freq: Two times a day (BID) | ORAL | Status: DC
  Administered 2019-03-12 – 2019-03-13 (×2): 2.5 mg via ORAL
  Filled 2019-03-12 (×2): qty 1

## 2019-03-12 MED ORDER — SODIUM BICARBONATE 650 MG PO TABS
650.0000 mg | ORAL_TABLET | Freq: Every day | ORAL | Status: DC
  Administered 2019-03-12 – 2019-03-13 (×2): 650 mg via ORAL
  Filled 2019-03-12 (×2): qty 1

## 2019-03-12 MED ORDER — CILOSTAZOL 100 MG PO TABS
100.0000 mg | ORAL_TABLET | Freq: Two times a day (BID) | ORAL | Status: DC
  Administered 2019-03-12 – 2019-03-13 (×2): 100 mg via ORAL
  Filled 2019-03-12 (×3): qty 1

## 2019-03-12 MED ORDER — AMLODIPINE BESYLATE 5 MG PO TABS
5.0000 mg | ORAL_TABLET | Freq: Every day | ORAL | Status: DC
  Administered 2019-03-12 – 2019-03-13 (×2): 5 mg via ORAL
  Filled 2019-03-12 (×2): qty 1

## 2019-03-12 MED ORDER — RANOLAZINE ER 500 MG PO TB12: 500.0000 mg | ORAL_TABLET | Freq: Two times a day (BID) | ORAL | Status: DC

## 2019-03-12 MED ORDER — SODIUM CHLORIDE 0.9 % IV SOLN
INTRAVENOUS | Status: DC
  Administered 2019-03-12 – 2019-03-13 (×2): via INTRAVENOUS

## 2019-03-12 MED ORDER — TICAGRELOR 60 MG PO TABS
60.0000 mg | ORAL_TABLET | Freq: Two times a day (BID) | ORAL | Status: DC
  Administered 2019-03-12 – 2019-03-13 (×2): 60 mg via ORAL
  Filled 2019-03-12 (×3): qty 1

## 2019-03-12 MED ORDER — LOSARTAN POTASSIUM 25 MG PO TABS
25.0000 mg | ORAL_TABLET | Freq: Every day | ORAL | Status: DC
  Administered 2019-03-12 – 2019-03-13 (×2): 25 mg via ORAL
  Filled 2019-03-12 (×2): qty 1

## 2019-03-12 MED ORDER — NITROGLYCERIN 0.4 MG SL SUBL: 0.4000 mg | SUBLINGUAL_TABLET | SUBLINGUAL | Status: DC | PRN

## 2019-03-12 MED ORDER — EZETIMIBE 10 MG PO TABS
10.0000 mg | ORAL_TABLET | Freq: Every day | ORAL | Status: DC
  Administered 2019-03-12 – 2019-03-13 (×2): 10 mg via ORAL
  Filled 2019-03-12 (×2): qty 1

## 2019-03-12 NOTE — H&P (Signed)
[facility] NAME: Dylan Goodman    MR#:  [id number]  DATE OF BIRTH:  21-Feb-1951  DATE OF ADMISSION:  03/12/2019  PRIMARY CARE PHYSICIAN: Steele Sizer, MD   REQUESTING/REFERRING PHYSICIAN: Dr. Merlyn Lot  CHIEF COMPLAINT:   Chief Complaint  Patient presents with   Nausea   Emesis    HISTORY OF PRESENT ILLNESS:  Dylan Goodman  is a 68 y.o. male with a known history of CAD status post CABG, atrial fibrillation on Eliquis, history of nasopharyngeal carcinoma, diabetes mellitus type, iron deficiency anemia, CKD stage IV presents to hospital secondary to dizziness, weakness and nausea today. Patient is accompanied by his wife in the emergency room today.  He states that his dizziness symptoms started about 4 to 5 days ago.  Mostly lightheaded but no feeling that the room is spinning around him.  He has had vertigo in the past but feels like this is a different feeling.  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  Recently started on Ranexa and also Bentyl.  He has seen his PCP 2 days ago for similar complaints.  He was supposed to see ENT and get an outpatient MRI as well for the symptoms.  Yesterday patient had a colonoscopy and EGD regularly schedule went for his procedures.  States that his lightheadedness was never better.  This morning when he woke up, and sat by the side of the bed-his symptoms were intensified and so presented to the emergency room. He was noted to be slightly hypoxic here received a dose of steroids and duo nebs and was placed on supplemental oxygen.  He denies any shortness of breath or cough or chest pain or fevers.  He has chronic diarrhea, colonoscopy showed polyps which were removed.  PAST MEDICAL HISTORY:   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 ([place]) 05/2008    Chronic depression 09/03/2018   CKD (chronic kidney disease) 09/03/2018   Coronary atherosclerosis 09/03/2018   Coronary bypass graft mechanical complication 57/3220   Diabetes mellitus without complication ([place])    Gastric ulcer with hemorrhage 09/03/2018   Gastritis 09/03/2018   GERD (gastroesophageal reflux disease) 09/03/2018   Heart disease    Heart murmur    IDA (iron deficiency anemia) 09/03/2018   Low magnesium levels 09/03/2018   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   Myocardial infarction [facility]) 2017   Personal history of radiation therapy 05/30/2018   39 treatments with chemotherapy nasopharyngeal cancer   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    PAST SURGICAL HISTORY:   Past Surgical History:  Procedure Laterality Date   CAROTID ENDARTERECTOMY  2006   COLONOSCOPY WITH PROPOFOL N/A 03/11/2019   Procedure: COLONOSCOPY WITH PROPOFOL;  Surgeon: Toledo, Benay Pike, MD;  Location: [facility] ENDOSCOPY;  Service: Gastroenterology;  Laterality: N/A;   CORONARY ARTERY BYPASS GRAFT  1996   quadruple bypass   ESOPHAGOGASTRODUODENOSCOPY (EGD) WITH PROPOFOL N/A 03/11/2019   Procedure: ESOPHAGOGASTRODUODENOSCOPY (EGD) WITH PROPOFOL;  Surgeon: Toledo, Benay Pike, MD;  Location: [facility] ENDOSCOPY;  Service: Gastroenterology;  Laterality: N/A;   heart stent  10/2010   x3   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   11 coils   left leg stents  08/2009   LOWER EXTREMITY ANGIOGRAPHY Left 11/11/2018   Procedure: LOWER EXTREMITY ANGIOGRAPHY;  Surgeon: Katha Cabal, MD;  Location: [place] [facility]  [facility];  Service: Cardiovascular;  Laterality: Left;   REVISION OF AORTA BIFEMORAL BYPASS     right femo pop bypass  06/1998   11/2001, 08/2003   right leg stent  07/2009    SOCIAL HISTORY:   Social History   Tobacco Use   Smoking status: Former Smoker    Packs/day: 1.50    Years: 50.00    Pack years: 75.00    Types: Cigarettes    Quit date: 02/18/2017    Years since quitting:  2.0   Smokeless tobacco: Never Used  Substance Use Topics   Alcohol use: Not Currently    FAMILY HISTORY:   Family History  Problem Relation Age of Onset   Coronary artery disease Mother    Alcohol abuse Father    Colon cancer Father    Coronary artery disease Father    Kidney cancer Father    Breast cancer Sister     DRUG ALLERGIES:  No Known Allergies  REVIEW OF SYSTEMS:   Review of Systems  Constitutional: Positive for malaise/fatigue. Negative for chills, fever and weight loss.  HENT: Negative for ear discharge, ear pain, hearing loss, nosebleeds and tinnitus.   Eyes: Negative for blurred vision, double vision and photophobia.  Respiratory: Positive for shortness of breath. Negative for cough, hemoptysis and wheezing.   Cardiovascular: Negative for chest pain, palpitations, orthopnea and leg swelling.  Gastrointestinal: Positive for abdominal pain, diarrhea and nausea. Negative for constipation, heartburn, melena and vomiting.  Genitourinary: Negative for dysuria, frequency, hematuria and urgency.  Musculoskeletal: Negative for back pain, myalgias and neck pain.  Skin: Negative for rash.  Neurological: Positive for dizziness. Negative for tingling, tremors, sensory change, speech change, focal weakness and headaches.  Endo/Heme/Allergies: Does not bruise/bleed easily.  Psychiatric/Behavioral: Negative for depression.    MEDICATIONS AT HOME:   Prior to Admission medications   Medication Sig Start Date End Date Taking? Authorizing Provider  amLODipine (NORVASC) 5 MG tablet Take 5 mg by mouth daily.   Yes [provider]  carvedilol (COREG) 6.25 MG tablet Take 6.25 mg by mouth 2 (two) times daily.    Yes   cilostazol (PLETAL) 100 MG tablet Take 100 mg by mouth 2 (two) times daily.    Yes [provider]  ELIQUIS 2.5 MG TABS tablet Take 2.5 mg by mouth 2 (two) times daily.  08/28/18  Yes [provider]  ezetimibe (ZETIA) 10 MG tablet  Take 10 mg by mouth daily.  07/07/18  Yes [provider]  fenofibrate 160 MG tablet Take 160 mg by mouth daily.    Yes [provider]  Insulin Degludec (TRESIBA FLEXTOUCH) 200 UNIT/ML SOPN Inject 12 Units into the skin at bedtime.  05/17/18  Yes [provider]  isosorbide mononitrate (IMDUR) 120 MG 24 hr tablet Take 120 mg by mouth daily.   Yes [provider]  lansoprazole (PREVACID) 30 MG capsule Take 30 mg by mouth every morning.    Yes [provider]  losartan (COZAAR) 25 MG tablet Take 25 mg by mouth daily.  08/29/18  Yes [provider]  lovastatin (MEVACOR) 40 MG tablet Take 40 mg by mouth 2 (two) times daily.  07/01/18  Yes [provider]  ranolazine (RANEXA) 500 MG 12 hr tablet Take 500 mg by mouth 2 (two) times daily.   Yes Corey Skains, MD  sodium bicarbonate 650 MG tablet Take 1 tablet by mouth daily.  12/31/18  Yes Kolluru, Lurena Nida, MD  ticagrelor (BRILINTA) 60  MG TABS tablet Take 60 mg by mouth 2 (two) times daily.    Yes [provider]  nitroGLYCERIN (NITROSTAT) 0.4 MG SL tablet Place 0.4 mg under the tongue every 5 (five) minutes as needed for chest pain.  07/01/18   [provider]      VITAL SIGNS:  Blood pressure (!) 133/100, pulse (!) 123, temperature 97.8 F (36.6 C), resp. rate 16, height 6' (1.829 m), weight 98.9 kg, SpO2 96 %.  PHYSICAL EXAMINATION:  Physical Exam  GENERAL:  68 y.o.-year-old patient lying in the bed with no acute distress.  EYES: Pupils equal, round, reactive to light and accommodation. No scleral icterus. Extraocular muscles intact.  HEENT: Head atraumatic, normocephalic. Oropharynx and nasopharynx clear.  NECK:  Supple, no jugular venous distention. No thyroid enlargement, no tenderness.  LUNGS: scant breath sounds bilaterally, no wheezing, rales,rhonchi or crepitation. No use of accessory muscles of respiration.  CARDIOVASCULAR: S1, S2 normal. No  rubs, or  gallops. 2/6 systolic murmur present ABDOMEN: Soft, nontender, nondistended. Bowel sounds present. No organomegaly or mass.  EXTREMITIES: No pedal edema, cyanosis, or clubbing.  NEUROLOGIC: Cranial nerves II through XII are intact. Muscle strength 5/5 in all extremities. Sensation intact. Gait not checked. No nystagmus PSYCHIATRIC: The patient is alert and oriented x 3.  SKIN: No obvious rash, lesion, or ulcer.   LABORATORY PANEL:   CBC Recent Labs  Lab 03/12/19 0714  WBC 8.6  HGB 12.9*  HCT 40.1  PLT 192   ------------------------------------------------------------------------------------------------------------------  Chemistries  Recent Labs  Lab 03/12/19 0714  NA 138  K 3.4*  CL 108  CO2 15*  GLUCOSE 140*  BUN 48*  CREATININE 2.87*  CALCIUM 6.8*  AST 25  ALT 20  ALKPHOS 62  BILITOT 1.0   ------------------------------------------------------------------------------------------------------------------  Cardiac Enzymes No results for input(s): TROPONINI in the last 168 hours. ------------------------------------------------------------------------------------------------------------------  RADIOLOGY:  Ct Abdomen Pelvis Wo Contrast  Result Date: 03/12/2019 CLINICAL DATA:  Nausea vomiting post EGD. EXAM: CT ABDOMEN AND PELVIS WITHOUT CONTRAST TECHNIQUE: Multidetector CT imaging of the abdomen and pelvis was performed following the standard protocol without IV contrast. COMPARISON:  None. FINDINGS: Lower chest: Calcific atherosclerotic disease of the coronary arteries and aorta. Hepatobiliary: 1.8 cm benign-appearing cyst in the dome of the liver. Pancreas: Unremarkable. No pancreatic ductal dilatation or surrounding inflammatory changes. Spleen: Normal in size without focal abnormality. Adrenals/Urinary Tract: Adrenal glands are unremarkable. Kidneys are normal, without renal calculi, focal lesion, or hydronephrosis. Bladder is unremarkable. Stomach/Bowel: Diffuse  thickening of the wall of the gastric body and cardia, with multiple gas bubbles associated with the thickened wall. No extraluminal gas. The stomach is not opacified and therefore the internal architecture of the gastric wall could not be evaluated. No evidence of small-bowel obstruction. Diffuse colonic diverticulosis without evidence of diverticulitis. Vascular/Lymphatic: Advanced atherosclerotic disease with tortuosity of the aorta. Fusiform infrarenal abdominal aortic aneurysm which measures 4 cm. Additional saccular aneurysm of the distal abdominal aorta which measures 3.8 cm, image 49/104, sequence 2. No enlarged abdominal or pelvic lymph nodes. Reproductive: Enlargement of the prostate gland. Other: Coil artifact associated with the left iliac vessels. Musculoskeletal: Spondylosis of the spine. IMPRESSION: 1. Diffuse thickening of the wall of the gastric body and cardia, with multiple gas bubbles associated with the thickened wall. The stomach is not opacified and therefore the internal architecture of the gastric wall could not be evaluated. No extraluminal gas. 2. Advanced atherosclerotic disease of the aorta and coronary arteries. 3. Fusiform infrarenal abdominal aortic  aneurysm which measures 4 cm. Additional saccular aneurysm of the distal abdominal aorta which measures 3.8 cm. Evaluation with CTA or MRA of the abdomen may be considered, if found clinically appropriate. 4. Enlargement of the prostate gland. Please correlate to serum PSA values. Electronically Signed   By: Fidela Salisbury M.D.   On: 03/12/2019 09:23   Dg Chest 2 View  Result Date: 03/12/2019 CLINICAL DATA:  Nausea and vomiting post upper endoscopy. Possible aspiration. EXAM: CHEST - 2 VIEW COMPARISON:  None. FINDINGS: Mild cardiomegaly. Aortic atherosclerosis. Mild hyperinflation in diffuse pulmonary interstitial prominence, consistent with COPD. No evidence of pulmonary airspace disease or pleural effusion. Prior CABG noted.  IMPRESSION: Mild cardiomegaly and COPD. No active lung disease. Electronically Signed   By: Marlaine Hind M.D.   On: 03/12/2019 08:09      IMPRESSION AND PLAN:   Dylan Goodman  is a 68 y.o. male with a known history of CAD status post CABG, atrial fibrillation on Eliquis, history of nasopharyngeal carcinoma, diabetes mellitus type, iron deficiency anemia, CKD stage IV presents to hospital secondary to dizziness, weakness and nausea today.  1.  Dizziness-check orthostatics and if hypotensive, will give IV fluids -MRI of the brain to rule out any posterior circulation infarct -We will hold Ranexa -PT consult if needed tomorrow  2.  CAD status post CABG-continue Brilinta, statin, Coreg, losartan and Imdur low normal,    3.  Hypertension-on Coreg, losartan, Imdur and Norvasc.  If blood pressure is low, will hold medication  4.  Diabetes-we will start sliding scale insulin  5.  PAD-on Pletal and statin  6.  A. fib-rate controlled.  On Coreg and Eliquis  Independent at baseline   All the records are reviewed and case discussed with ED provider. Management plans discussed with the patient, family and they are in agreement.  CODE STATUS: Full Code  TOTAL TIME TAKING CARE OF THIS PATIENT: 52 minutes.    Gladstone Lighter M.D on 03/12/2019 at 5:28 PM  Between 7am to 6pm - Pager - [phone]  After 6pm go to [email or web address] - [employer] [facility]  Office  [phone]  CC: Primary care physician; Steele Sizer, MD   Note: This dictation was prepared with Dragon dictation along with smaller phrase technology. Any transcriptional errors that result from this process are unintentional.

## 2019-03-12 NOTE — ED Notes (Signed)
4 oz diet cola given to patient.  Patient sipping on beverage now. Continue to monitor.

## 2019-03-12 NOTE — ED Triage Notes (Signed)
ARrives from home via EMS.  Patient has history of chronic diarrhea and called EMS today for c/o nausea, emesis, and dizziness.  Zofran given by EMS.

## 2019-03-12 NOTE — ED Notes (Signed)
Tolerating PO fluid.

## 2019-03-12 NOTE — ED Notes (Signed)
ED TO INPATIENT HANDOFF REPORT  ED Nurse Name and Phone #: Helene Kelp #[phone]  S Name/Age/Gender Dylan Goodman 68 y.o. male Room/Bed: ED34A/ED34A  Code Status   Code Status: Not on file  Home/SNF/Other Home Patient oriented to: self, place, time and situation Is this baseline? Yes   Triage Complete: Triage complete  Chief Complaint weakness  Triage Note ARrives from home via EMS.  Patient has history of chronic diarrhea and called EMS today for c/o nausea, emesis, and dizziness.  Zofran given by EMS.   Allergies No Known Allergies  Level of Care/Admitting Diagnosis ED Disposition    ED Disposition Condition [facility] Area: [place] [[id number]]  Level of Care: Med-Surg [16]  Covid Evaluation: Confirmed COVID Negative  Diagnosis: Dizziness [[id number]]  Admitting Physician: Gladstone Lighter [[id number]]  Attending Physician: Gladstone Lighter [[id number]]  Estimated length of stay: past midnight tomorrow  Certification:: I certify this patient will need inpatient services for at least 2 midnights  PT Class (Do Not Modify): Inpatient [101]  PT Acc Code (Do Not Modify): Private [1]       B Medical/Surgery History Past Medical History:  Diagnosis Date  . Bleeding gastrointestinal 09/03/2018  . 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 Chronic depression 09/03/2018  . CKD (chronic kidney disease) 09/03/2018  . Coronary atherosclerosis 09/03/2018  . Coronary bypass graft mechanical complication 63/7858  . Diabetes mellitus without complication ([place])   . Gastric ulcer with hemorrhage 09/03/2018  . Gastritis 09/03/2018  . GERD (gastroesophageal reflux disease) 09/03/2018  . Heart disease   . Heart murmur   . IDA (iron deficiency anemia) 09/03/2018  . Low magnesium levels 09/03/2018  . 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 Myocardial infarction ([place]) 2017  . Personal history of radiation therapy  05/30/2018   39 treatments with chemotherapy nasopharyngeal cancer  . 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 CAROTID ENDARTERECTOMY  2006  . COLONOSCOPY WITH PROPOFOL N/A 03/11/2019   Procedure: COLONOSCOPY WITH PROPOFOL;  Surgeon: Toledo, Benay Pike, MD;  Location: [facility] ENDOSCOPY;  Service: Gastroenterology;  Laterality: N/A;  . CORONARY ARTERY BYPASS GRAFT  1996   quadruple bypass  . ESOPHAGOGASTRODUODENOSCOPY (EGD) WITH PROPOFOL N/A 03/11/2019   Procedure: ESOPHAGOGASTRODUODENOSCOPY (EGD) WITH PROPOFOL;  Surgeon: Toledo, Benay Pike, MD;  Location: [facility] ENDOSCOPY;  Service: Gastroenterology;  Laterality: N/A;  . heart stent  10/2010   x3  . 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 left leg stents  08/2009  . LOWER EXTREMITY ANGIOGRAPHY Left 11/11/2018   Procedure: LOWER EXTREMITY ANGIOGRAPHY;  Surgeon: Katha Cabal, MD;  Location: [place] [facility];  Service: Cardiovascular;  Laterality: Left;  . REVISION OF AORTA BIFEMORAL BYPASS    . right femo pop bypass  06/1998   11/2001, 08/2003  . right leg stent  07/2009     A IV Location/Drains/Wounds Patient Lines/Drains/Airways Status   Active Line/Drains/Airways    Name:   Placement date:   Placement time:   Site:   Days:   Peripheral IV 03/12/19 Right Forearm   03/12/19    0713    Forearm   less than 1   Sheath 11/11/18 Right Arterial;Femoral   11/11/18    0851    Arterial;Femoral   121          Intake/Output Last 24 hours  Intake/Output Summary (  Last 24 hours) at 03/12/2019 1604 Last data filed at 03/12/2019 1024 Gross per 24 hour  Intake 250 ml  Output -  Net 250 ml    Labs/Imaging Results for orders placed or performed during the hospital encounter of 03/12/19 (from the past 48 hour(s))  Lipase, blood     Status: None   Collection Time: 03/12/19  7:14 AM  Result Value Ref Range   Lipase 45 11 - 51 U/L    Comment: Performed at [facility], [address] [place], [place] [address]  Comprehensive metabolic panel     Status: Abnormal   Collection Time: 03/12/19  7:14 AM  Result Value Ref Range   Sodium 138 135 - 145 mmol/L   Potassium 3.4 (L) 3.5 - 5.1 mmol/L   Chloride 108 98 - 111 mmol/L   CO2 15 (L) 22 - 32 mmol/L   Glucose, Bld 140 (H) 70 - 99 mg/dL   BUN 48 (H) 8 - 23 mg/dL   Creatinine, Ser 2.87 (H) 0.61 - 1.24 mg/dL   Calcium 6.8 (L) 8.9 - 10.3 mg/dL   Total Protein 6.7 6.5 - 8.1 g/dL   Albumin 3.5 3.5 - 5.0 g/dL   AST 25 15 - 41 U/L   ALT 20 0 - 44 U/L   Alkaline Phosphatase 62 38 - 126 U/L   Total Bilirubin 1.0 0.3 - 1.2 mg/dL   GFR calc non Af Amer 22 (L) >60 mL/min   GFR calc Af Amer 25 (L) >60 mL/min   Anion gap 15 5 - 15    Comment: Performed at [facility], [address] [place], [place] [address]  CBC     Status: Abnormal   Collection Time: 03/12/19  7:14 AM  Result Value Ref Range   WBC 8.6 4.0 - 10.5 K/uL   RBC 4.44 4.22 - 5.81 MIL/uL   Hemoglobin 12.9 (L) 13.0 - 17.0 g/dL   HCT 40.1 39.0 - 52.0 %   MCV 90.3 80.0 - 100.0 fL   MCH 29.1 26.0 - 34.0 pg   MCHC 32.2 30.0 - 36.0 g/dL   RDW 13.9 11.5 - 15.5 %   Platelets 192 150 - 400 K/uL   nRBC 0.0 0.0 - 0.2 %    Comment: Performed at [facility], [address] [place], [place] [address]  Protime-INR     Status: Abnormal   Collection Time: 03/12/19  7:14 AM  Result Value Ref Range   Prothrombin Time 16.3 (H) 11.4 - 15.2 seconds   INR 1.3 (H) 0.8 - 1.2    Comment: (NOTE) INR goal varies based on device and disease states. Performed at [facility], [address] [place], [place] [address]   APTT     Status: None   Collection Time: 03/12/19  7:14 AM  Result Value Ref Range   aPTT 36 24 - 36 seconds    Comment: Performed at [facility], [address] [place], [place] [address]  Brain natriuretic peptide     Status: None   Collection Time: 03/12/19  7:14 AM  Result Value Ref Range   B  Natriuretic Peptide 92.0 0.0 - 100.0 pg/mL    Comment: Performed at [facility], [address] [place], [place] [address]  Glucose, capillary     Status: Abnormal   Collection Time: 03/12/19  9:46 AM  Result Value Ref Range   Glucose-Capillary 122 (H) 70 - 99 mg/dL  SARS Coronavirus 2 (  Hospital order, Performed in [facility] hospital lab) Nasopharyngeal Nasopharyngeal Swab     Status: None   Collection Time: 03/12/19 10:12 AM   Specimen: Nasopharyngeal Swab  Result Value Ref Range   SARS Coronavirus 2 NEGATIVE NEGATIVE    Comment: (NOTE) If result is NEGATIVE SARS-CoV-2 target nucleic acids are NOT DETECTED. The SARS-CoV-2 RNA is generally detectable in upper and lower  respiratory specimens during the acute phase of infection. The lowest  concentration of SARS-CoV-2 viral copies this assay can detect is 250  copies / mL. A negative result does not preclude SARS-CoV-2 infection  and should not be used as the sole basis for treatment or other  patient management decisions.  A negative result may occur with  improper specimen collection / handling, submission of specimen other  than nasopharyngeal swab, presence of viral mutation(s) within the  areas targeted by this assay, and inadequate number of viral copies  (<250 copies / mL). A negative result must be combined with clinical  observations, patient history, and epidemiological information. If result is POSITIVE SARS-CoV-2 target nucleic acids are DETECTED. The SARS-CoV-2 RNA is generally detectable in upper and lower  respiratory specimens dur ing the acute phase of infection.  Positive  results are indicative of active infection with SARS-CoV-2.  Clinical  correlation with patient history and other diagnostic information is  necessary to determine patient infection status.  Positive results do  not rule out bacterial infection or co-infection with other viruses. If result is PRESUMPTIVE POSTIVE SARS-CoV-2 nucleic  acids MAY BE PRESENT.   A presumptive positive result was obtained on the submitted specimen  and confirmed on repeat testing.  While 2019 novel coronavirus  (SARS-CoV-2) nucleic acids may be present in the submitted sample  additional confirmatory testing may be necessary for epidemiological  and / or clinical management purposes  to differentiate between  SARS-CoV-2 and other Sarbecovirus currently known to infect humans.  If clinically indicated additional testing with an alternate test  methodology [phone]) is advised. The SARS-CoV-2 RNA is generally  detectable in upper and lower respiratory sp ecimens during the acute  phase of infection. The expected result is Negative. Fact Sheet for Patients:  StrictlyIdeas.no Fact Sheet for Healthcare Providers: BankingDealers.co.za This test is not yet approved or cleared by the [place] FDA and has been authorized for detection and/or diagnosis of SARS-CoV-2 by FDA under an Emergency Use Authorization (EUA).  This EUA will remain in effect (meaning this test can be used) for the duration of the COVID-19 declaration under Section 564(b)(1) of the Act, 21 U.S.C. section 360bbb-3(b)(1), unless the authorization is terminated or revoked sooner. Performed at [facility], [place], [place] [address]   Glucose, capillary     Status: Abnormal   Collection Time: 03/12/19  2:40 PM  Result Value Ref Range   Glucose-Capillary 111 (H) 70 - 99 mg/dL   Comment 1 Notify RN    Comment 2 Document in Chart    Ct Abdomen Pelvis Wo Contrast  Result Date: 03/12/2019 CLINICAL DATA:  Nausea vomiting post EGD. EXAM: CT ABDOMEN AND PELVIS WITHOUT CONTRAST TECHNIQUE: Multidetector CT imaging of the abdomen and pelvis was performed following the standard protocol without IV contrast. COMPARISON:  None. FINDINGS: Lower chest: Calcific atherosclerotic disease of the coronary arteries and  aorta. Hepatobiliary: 1.8 cm benign-appearing cyst in the dome of the liver. Pancreas: Unremarkable. No pancreatic ductal dilatation or surrounding inflammatory changes. Spleen: Normal in size without focal abnormality. Adrenals/Urinary Tract:  Adrenal glands are unremarkable. Kidneys are normal, without renal calculi, focal lesion, or hydronephrosis. Bladder is unremarkable. Stomach/Bowel: Diffuse thickening of the wall of the gastric body and cardia, with multiple gas bubbles associated with the thickened wall. No extraluminal gas. The stomach is not opacified and therefore the internal architecture of the gastric wall could not be evaluated. No evidence of small-bowel obstruction. Diffuse colonic diverticulosis without evidence of diverticulitis. Vascular/Lymphatic: Advanced atherosclerotic disease with tortuosity of the aorta. Fusiform infrarenal abdominal aortic aneurysm which measures 4 cm. Additional saccular aneurysm of the distal abdominal aorta which measures 3.8 cm, image 49/104, sequence 2. No enlarged abdominal or pelvic lymph nodes. Reproductive: Enlargement of the prostate gland. Other: Coil artifact associated with the left iliac vessels. Musculoskeletal: Spondylosis of the spine. IMPRESSION: 1. Diffuse thickening of the wall of the gastric body and cardia, with multiple gas bubbles associated with the thickened wall. The stomach is not opacified and therefore the internal architecture of the gastric wall could not be evaluated. No extraluminal gas. 2. Advanced atherosclerotic disease of the aorta and coronary arteries. 3. Fusiform infrarenal abdominal aortic aneurysm which measures 4 cm. Additional saccular aneurysm of the distal abdominal aorta which measures 3.8 cm. Evaluation with CTA or MRA of the abdomen may be considered, if found clinically appropriate. 4. Enlargement of the prostate gland. Please correlate to serum PSA values. Electronically Signed   By: Fidela Salisbury M.D.   On:  03/12/2019 09:23   Dg Chest 2 View  Result Date: 03/12/2019 CLINICAL DATA:  Nausea and vomiting post upper endoscopy. Possible aspiration. EXAM: CHEST - 2 VIEW COMPARISON:  None. FINDINGS: Mild cardiomegaly. Aortic atherosclerosis. Mild hyperinflation in diffuse pulmonary interstitial prominence, consistent with COPD. No evidence of pulmonary airspace disease or pleural effusion. Prior CABG noted. IMPRESSION: Mild cardiomegaly and COPD. No active lung disease. Electronically Signed   By: Marlaine Hind M.D.   On: 03/12/2019 08:09    Pending Labs Unresulted Labs (From admission, onward)    Start     Ordered   03/12/19 0724  C difficile quick scan w PCR reflex  (C Difficile quick screen w PCR reflex panel)  Once, for 24 hours,   STAT     03/12/19 0724   03/12/19 0724  Gastrointestinal Panel by PCR , Stool  (Gastrointestinal Panel by PCR, Stool)  Once,   STAT     03/12/19 0724   03/12/19 0716  Urinalysis, Complete w Microscopic  ONCE - STAT,   STAT     03/12/19 0716   Signed and Held  HIV antibody (Routine Testing)  Once,   R     Signed and Held   Signed and Held  Basic metabolic panel  Tomorrow morning,   R     Signed and Held   Signed and Held  CBC  Tomorrow morning,   R     Signed and Held          Vitals/Pain Today's Vitals   03/12/19 0830 03/12/19 0953 03/12/19 1025 03/12/19 1030  BP: 114/62   (!) 138/92  Pulse: 99   100  Resp: (!) 27   (!) 24  Temp:      TempSrc:      SpO2: (!) 87%   93%  Weight:      Height:      PainSc:  0-No pain 0-No pain     Isolation Precautions No active isolations  Medications Medications  sodium chloride flush (NS) 0.9 % injection 3 mL (has  no administration in time range)  promethazine (PHENERGAN) injection 12.5 mg (has no administration in time range)  ondansetron (ZOFRAN) injection 4 mg (4 mg Intravenous Given 03/12/19 0727)  sodium chloride 0.9 % bolus 250 mL (0 mLs Intravenous Stopped 03/12/19 1024)  ipratropium-albuterol (DUONEB)  0.5-2.5 (3) MG/3ML nebulizer solution 3 mL (3 mLs Nebulization Given 03/12/19 1009)  ipratropium-albuterol (DUONEB) 0.5-2.5 (3) MG/3ML nebulizer solution 3 mL (3 mLs Nebulization Given 03/12/19 1009)  methylPREDNISolone sodium succinate (SOLU-MEDROL) 125 mg/2 mL injection 60 mg (60 mg Intravenous Given 03/12/19 1059)    Mobility walks Low fall risk   Focused Assessments see previous assessment   R Recommendations: See Admitting Provider Note  Report given to:   Additional Notes:

## 2019-03-12 NOTE — ED Provider Notes (Signed)
[facility] Emergency Department Provider Note    First MD Initiated Contact with Patient 03/12/19 [phone]     (approximate)  I have reviewed the triage vital signs and the nursing notes.   HISTORY  Chief Complaint Nausea and Emesis    HPI Dylan Goodman is a 68 y.o. male extensive below listed past medical history presents the ER for continuous diarrhea. Patient had endoscopy and colonoscopy yesterday.   Patient states the diarrhea is brown nonbloody non-melanotic.  Is not been have any fevers.  States that it is as if the bowel prep was repeated yesterday.  He is feeling weak and lightheaded.  Denies any shortness of breath.  No history of congestive heart failure COPD.  Adamantly denies any abdominal pain or back pain.   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 ([place]) 05/2008   Chronic depression 09/03/2018   CKD (chronic kidney disease) 09/03/2018   Coronary atherosclerosis 09/03/2018   Coronary bypass graft mechanical complication [id number]/6734   Diabetes mellitus without complication ([place])    Gastric ulcer with hemorrhage 09/03/2018   Gastritis 09/03/2018   GERD (gastroesophageal reflux disease) 09/03/2018   Heart disease    Heart murmur    IDA (iron deficiency anemia) 09/03/2018   Low magnesium levels 09/03/2018   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   Myocardial infarction [facility]) 2017   Personal history of radiation therapy 05/30/2018   39 treatments with chemotherapy nasopharyngeal cancer   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   Family History  Problem Relation Age of Onset   Coronary artery disease Mother    Alcohol abuse Father    Colon cancer Father    Coronary artery disease Father    Kidney cancer Father    Breast cancer Sister    Past Surgical History:  Procedure Laterality Date   CAROTID  ENDARTERECTOMY  2006   COLONOSCOPY WITH PROPOFOL N/A 03/11/2019   Procedure: COLONOSCOPY WITH PROPOFOL;  Surgeon: Toledo, Benay Pike, MD;  Location: [facility] ENDOSCOPY;  Service: Gastroenterology;  Laterality: N/A;   CORONARY ARTERY BYPASS GRAFT  1996   quadruple bypass   ESOPHAGOGASTRODUODENOSCOPY (EGD) WITH PROPOFOL N/A 03/11/2019   Procedure: ESOPHAGOGASTRODUODENOSCOPY (EGD) WITH PROPOFOL;  Surgeon: Toledo, Benay Pike, MD;  Location: [facility] ENDOSCOPY;  Service: Gastroenterology;  Laterality: N/A;   heart stent  10/2010   x3   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   11 coils   left leg stents  08/2009   LOWER EXTREMITY ANGIOGRAPHY Left 11/11/2018   Procedure: LOWER EXTREMITY ANGIOGRAPHY;  Surgeon: Katha Cabal, MD;  Location: [facility] CV LAB;  Service: Cardiovascular;  Laterality: Left;   REVISION OF AORTA BIFEMORAL BYPASS     right femo pop bypass  06/1998   11/2001, 08/2003   right leg stent  07/2009   Patient Active Problem List   Diagnosis Date Noted   Dizziness 03/12/2019   Atherosclerosis of aorta ([place]) 01/29/2019   Type 2 diabetes mellitus with stage 4 chronic kidney disease, with long-term current use of insulin ([place]) 09/13/2018   Hyperlipidemia, mixed 09/13/2018   Senile purpura ([place]) 09/08/2018   Morbid obesity ([place]) 09/08/2018   Chronic diarrhea of unknown origin 09/04/2018   Menetrier's disease (hyperplastic hypersecretory gastropathy) 09/04/2018   Chronic depression 09/03/2018   Chronic kidney disease 09/03/2018   History of gastric ulcer 09/03/2018   Gastritis 09/03/2018   GERD (gastroesophageal reflux disease)  09/03/2018   IDA (iron deficiency anemia) 09/03/2018   Low magnesium levels 09/03/2018   History of cancer chemotherapy 43/32/9518   Chronic systolic CHF (congestive heart failure), NYHA class 2 ([place]) 09/02/2018   Atherosclerosis of native arteries of extremity with rest pain ([place]) 07/21/2018   Benign essential HTN 07/21/2018    Bilateral carotid artery stenosis 07/21/2018   Coronary artery disease involving coronary bypass graft of native heart 07/21/2018      Prior to Admission medications   Medication Sig Start Date End Date Taking? Authorizing Provider  amLODipine (NORVASC) 5 MG tablet Take 5 mg by mouth daily.   Yes [provider]  carvedilol (COREG) 6.25 MG tablet Take 6.25 mg by mouth 2 (two) times daily.    Yes   cilostazol (PLETAL) 100 MG tablet Take 100 mg by mouth 2 (two) times daily.    Yes [provider]  ELIQUIS 2.5 MG TABS tablet Take 2.5 mg by mouth 2 (two) times daily.  08/28/18  Yes [provider]  ezetimibe (ZETIA) 10 MG tablet Take 10 mg by mouth daily.  07/07/18  Yes [provider]  fenofibrate 160 MG tablet Take 160 mg by mouth daily.    Yes [provider]  Insulin Degludec (TRESIBA FLEXTOUCH) 200 UNIT/ML SOPN Inject 12 Units into the skin at bedtime.  05/17/18  Yes [provider]  isosorbide mononitrate (IMDUR) 120 MG 24 hr tablet Take 120 mg by mouth daily.   Yes [provider]  lansoprazole (PREVACID) 30 MG capsule Take 30 mg by mouth every morning.    Yes [provider]  losartan (COZAAR) 25 MG tablet Take 25 mg by mouth daily.  08/29/18  Yes [provider]  lovastatin (MEVACOR) 40 MG tablet Take 40 mg by mouth 2 (two) times daily.  07/01/18  Yes [provider]  ranolazine (RANEXA) 500 MG 12 hr tablet Take 500 mg by mouth 2 (two) times daily.   Yes Corey Skains, MD  sodium bicarbonate 650 MG tablet Take 1 tablet by mouth daily.  12/31/18  Yes Kolluru, Lurena Nida, MD  ticagrelor (BRILINTA) 60 MG TABS tablet Take 60 mg by mouth 2 (two) times daily.    Yes [provider]  nitroGLYCERIN (NITROSTAT) 0.4 MG SL tablet Place 0.4 mg under the tongue every 5 (five) minutes as needed for chest pain.  07/01/18   [provider]    Allergies Patient has no known allergies.    Social  History Social History   Tobacco Use   Smoking status: Former Smoker    Packs/day: 1.50    Years: 50.00    Pack years: 75.00    Types: Cigarettes    Quit date: 02/18/2017    Years since quitting: 2.0   Smokeless tobacco: Never Used  Substance Use Topics   Alcohol use: Not Currently   Drug use: Never    Review of Systems Patient denies headaches, rhinorrhea, blurry vision, numbness, shortness of breath, chest pain, edema, cough, abdominal pain, nausea, vomiting, diarrhea, dysuria, fevers, rashes or hallucinations unless otherwise stated above in HPI. ____________________________________________   PHYSICAL EXAM:  VITAL SIGNS: Vitals:   03/12/19 0830 03/12/19 1030  BP: 114/62 (!) 138/92  Pulse: 99 100  Resp: (!) 27 (!) 24  Temp:    SpO2: (!) 87% 93%    Constitutional: Alert and oriented.  Eyes: Conjunctivae are normal.  Head: Atraumatic. Nose: No congestion/rhinnorhea. Mouth/Throat: Mucous membranes are moist.   Neck: No stridor. Painless ROM.  Cardiovascular: Normal rate, regular rhythm. Grossly normal heart sounds.  Good peripheral circulation. Respiratory: Normal respiratory effort.  No retractions. Lungs CTAB. Gastrointestinal: Soft and in all four quadrants. No distention. No abdominal bruits. No CVA tenderness. Genitourinary:  Musculoskeletal: No lower extremity tenderness nor edema.  No joint effusions. Neurologic:  Normal speech and language. No gross focal neurologic deficits are appreciated. No facial droop Skin:  Skin is warm, dry and intact. No rash noted. Psychiatric: Mood and affect are normal. Speech and behavior are normal.  ____________________________________________   LABS (all labs ordered are listed, but only abnormal results are displayed)  Results for orders placed or performed during the hospital encounter of 03/12/19 (from the past 24 hour(s))  Lipase, blood     Status: None   Collection Time: 03/12/19  7:14 AM  Result Value Ref Range     Lipase 45 11 - 51 U/L  Comprehensive metabolic panel     Status: Abnormal   Collection Time: 03/12/19  7:14 AM  Result Value Ref Range   Sodium 138 135 - 145 mmol/L   Potassium 3.4 (L) 3.5 - 5.1 mmol/L   Chloride 108 98 - 111 mmol/L   CO2 15 (L) 22 - 32 mmol/L   Glucose, Bld 140 (H) 70 - 99 mg/dL   BUN 48 (H) 8 - 23 mg/dL   Creatinine, Ser 2.87 (H) 0.61 - 1.24 mg/dL   Calcium 6.8 (L) 8.9 - 10.3 mg/dL   Total Protein 6.7 6.5 - 8.1 g/dL   Albumin 3.5 3.5 - 5.0 g/dL   AST 25 15 - 41 U/L   ALT 20 0 - 44 U/L   Alkaline Phosphatase 62 38 - 126 U/L   Total Bilirubin 1.0 0.3 - 1.2 mg/dL   GFR calc non Af Amer 22 (L) >60 mL/min   GFR calc Af Amer 25 (L) >60 mL/min   Anion gap 15 5 - 15  CBC     Status: Abnormal   Collection Time: 03/12/19  7:14 AM  Result Value Ref Range   WBC 8.6 4.0 - 10.5 K/uL   RBC 4.44 4.22 - 5.81 MIL/uL   Hemoglobin 12.9 (L) 13.0 - 17.0 g/dL   HCT 40.1 39.0 - 52.0 %   MCV 90.3 80.0 - 100.0 fL   MCH 29.1 26.0 - 34.0 pg   MCHC 32.2 30.0 - 36.0 g/dL   RDW 13.9 11.5 - 15.5 %   Platelets 192 150 - 400 K/uL   nRBC 0.0 0.0 - 0.2 %  Protime-INR     Status: Abnormal   Collection Time: 03/12/19  7:14 AM  Result Value Ref Range   Prothrombin Time 16.3 (H) 11.4 - 15.2 seconds   INR 1.3 (H) 0.8 - 1.2  APTT     Status: None   Collection Time: 03/12/19  7:14 AM  Result Value Ref Range   aPTT 36 24 - 36 seconds  Brain natriuretic peptide     Status: None   Collection Time: 03/12/19  7:14 AM  Result Value Ref Range   B Natriuretic Peptide 92.0 0.0 - 100.0 pg/mL  Glucose, capillary     Status: Abnormal   Collection Time: 03/12/19  9:46 AM  Result Value Ref Range   Glucose-Capillary 122 (H) 70 - 99 mg/dL  SARS Coronavirus 2 [facility] order, Performed in [facility] hospital lab) Nasopharyngeal Nasopharyngeal Swab     Status: None   Collection Time: 03/12/19 10:12 AM   Specimen: Nasopharyngeal Swab  Result Value Ref  Range   SARS Coronavirus 2 NEGATIVE NEGATIVE    ____________________________________________  EKG My review and personal interpretation at Time: 7:03   Indication: weakness  Rate: 105  Rhythm: afib Axis: normal Other: normal intervals, no stemi, occasional pvc ____________________________________________  RADIOLOGY  I personally reviewed all radiographic images ordered to evaluate for the above acute complaints and reviewed radiology reports and findings.  These findings were personally discussed with the patient.  Please see medical record for radiology report.  ____________________________________________   PROCEDURES  Procedure(s) performed:  Procedures    Critical Care performed: no ____________________________________________   INITIAL IMPRESSION / ASSESSMENT AND PLAN / ED COURSE  Pertinent labs & imaging results that were available during my care of the patient were reviewed by me and considered in my medical decision making (see chart for details).   DDX: Dehydration, electrolyte abnormality, colitis, pneumonia, gastritis, perforation, COPD  Dylan Goodman is a 68 y.o. who presents to the ED with symptoms as described above.  Patient frail weak appearing.  Will give IV hydration.  Blood work will be sent for the blood differential.  Clinical Course as of Mar 12 1419  Thu Mar 12, 2019  0845 Blood work appears at baseline with metabolic acidosis actually improving over the past 2 weeks and improvement in renal function.  Also complaining some crampy epigastric pain therefore after biopsy with EGD will order CT imaging to evaluate for possible perforation, obstruction, megacolon, etc.   [PR]  [phone] Discussed case in consultation with Dr. Alice Reichert is familiar with the patient states that is likely acute on chronic issue with his bowel and gastric disease.  No indication for IV Protonix drip or steroids at this time.  Patient started to feel dizzy again.  Stat CBG checked.  Will get additional IV fluids and IV  antiemetic.  If patient still nauseated will admit to the hospital for hydration.   [PR]  1028 Patient noted to be hypoxic.  Does have a history of COPD will give nebulizers as well as steroid.  Does not appear volume overloaded and actually likely dehydrated therefore will continue with gentle IV hydration.  Check for COVID but at this point anticipate patient will require hospitalization.   [PR]    Clinical Course User Index [PR] Merlyn Lot, MD    The patient was evaluated in Emergency Department today for the symptoms described in the history of present illness. He/she was evaluated in the context of the global COVID-19 pandemic, which necessitated consideration that the patient might be at risk for infection with the SARS-CoV-2 virus that causes COVID-19. Institutional protocols and algorithms that pertain to the evaluation of patients at risk for COVID-19 are in a state of rapid change based on information released by regulatory bodies including the CDC and federal and state organizations. These policies and algorithms were followed during the patient's care in the ED.  As part of my medical decision making, I reviewed the following data within the [facility] notes reviewed and incorporated, Labs reviewed, notes from prior ED visits and [place] Controlled Substance Database   ____________________________________________   FINAL CLINICAL IMPRESSION(S) / ED DIAGNOSES  Final diagnoses:  COPD with acute exacerbation ([place])  Acute respiratory failure with hypoxia ([place])      NEW MEDICATIONS STARTED DURING THIS VISIT:  New Prescriptions   No medications on file     Note:  This document was prepared using Dragon voice recognition software and may include unintentional dictation errors.  Merlyn Lot, MD 03/12/19 1420

## 2019-03-12 NOTE — ED Notes (Addendum)
Attempted to call report and Butch Penny RN advised to call her back after she reviewed handoff

## 2019-03-12 NOTE — Anesthesia Postprocedure Evaluation (Signed)
Anesthesia Post Note  Patient: Riley Hallum  Procedure(s) Performed: ESOPHAGOGASTRODUODENOSCOPY (EGD) WITH PROPOFOL (N/A ) COLONOSCOPY WITH PROPOFOL (N/A )  Patient location during evaluation: Endoscopy Anesthesia Type: General Level of consciousness: awake and alert Pain management: pain level controlled Vital Signs Assessment: post-procedure vital signs reviewed and stable Respiratory status: spontaneous breathing, nonlabored ventilation and respiratory function stable Cardiovascular status: blood pressure returned to baseline and stable Postop Assessment: no apparent nausea or vomiting Anesthetic complications: no Comments: Pt did well, no post procedure issues     Last Vitals:  Vitals:   03/11/19 1354 03/11/19 1404  BP: (!) 132/92 (!) 157/95  Pulse: (!) 112 (!) 110  Resp: (!) 21 (!) 22  Temp:    SpO2: 95% 91%    Last Pain:  Vitals:   03/11/19 1404  TempSrc:   PainSc: 0-No pain                 Alphonsus Sias

## 2019-03-12 NOTE — Progress Notes (Signed)
   03/12/19 2000  Clinical Encounter Type  Visited With Patient not available;Health care provider  Visit Type Initial  Referral From Nurse  Consult/Referral To Chaplain   Chaplain received a referral to complete or update an AD. This chaplain was informed by the patient's nurse that he was preparing to have an MRI; a follow-up tomorrow is recommended instead. This chaplain will pass this OR to the unit chaplain for follow-up.

## 2019-03-12 NOTE — ED Notes (Signed)
Placed on 2L [place]  

## 2019-03-13 ENCOUNTER — Inpatient Hospital Stay: Payer: Medicare Other

## 2019-03-13 DIAGNOSIS — J441 Chronic obstructive pulmonary disease with (acute) exacerbation: Secondary | ICD-10-CM | POA: Diagnosis not present

## 2019-03-13 DIAGNOSIS — I2510 Atherosclerotic heart disease of native coronary artery without angina pectoris: Secondary | ICD-10-CM | POA: Diagnosis not present

## 2019-03-13 DIAGNOSIS — I6521 Occlusion and stenosis of right carotid artery: Secondary | ICD-10-CM | POA: Diagnosis not present

## 2019-03-13 LAB — CBC
HCT: 36.7 % — ABNORMAL LOW (ref 39.0–52.0)
Hemoglobin: 12 g/dL — ABNORMAL LOW (ref 13.0–17.0)
MCH: 29.6 pg (ref 26.0–34.0)
MCHC: 32.7 g/dL (ref 30.0–36.0)
MCV: 90.6 fL (ref 80.0–100.0)
Platelets: 211 10*3/uL (ref 150–400)
RBC: 4.05 MIL/uL — ABNORMAL LOW (ref 4.22–5.81)
RDW: 14.1 % (ref 11.5–15.5)
WBC: 11.2 10*3/uL — ABNORMAL HIGH (ref 4.0–10.5)
nRBC: 0 % (ref 0.0–0.2)

## 2019-03-13 LAB — BASIC METABOLIC PANEL
Anion gap: 11 (ref 5–15)
BUN: 58 mg/dL — ABNORMAL HIGH (ref 8–23)
CO2: 19 mmol/L — ABNORMAL LOW (ref 22–32)
Calcium: 6.4 mg/dL — CL (ref 8.9–10.3)
Chloride: 110 mmol/L (ref 98–111)
Creatinine, Ser: 2.95 mg/dL — ABNORMAL HIGH (ref 0.61–1.24)
GFR calc Af Amer: 25 mL/min — ABNORMAL LOW (ref >60)
GFR calc non Af Amer: 21 mL/min — ABNORMAL LOW (ref >60)
Glucose, Bld: 139 mg/dL — ABNORMAL HIGH (ref 70–99)
Potassium: 3.9 mmol/L (ref 3.5–5.1)
Sodium: 140 mmol/L (ref 135–145)

## 2019-03-13 LAB — GLUCOSE, CAPILLARY
Glucose-Capillary: 94 mg/dL (ref 70–99)
Glucose-Capillary: 129 mg/dL — ABNORMAL HIGH (ref 70–99)

## 2019-03-13 LAB — PHOSPHORUS: Phosphorus: 2.8 mg/dL (ref 2.5–4.6)

## 2019-03-13 LAB — HEMOGLOBIN A1C
Hgb A1c MFr Bld: 6.7 % — ABNORMAL HIGH (ref 4.8–5.6)
Mean Plasma Glucose: 145.59 mg/dL

## 2019-03-13 LAB — CALCIUM: Calcium: 6.7 mg/dL — ABNORMAL LOW (ref 8.9–10.3)

## 2019-03-13 IMAGING — MR MRA NECK WITHOUT CONTRAST
1 series · 37 of 48 positions shown · non-contrast
Comparison: Brain MRI [DATE]

CLINICAL DATA: Vertigo, persistent, central dizziness, TIA

EXAM:
MRA NECK WITHOUT CONTRAST
TECHNIQUE: Angiographic images of the neck were obtained using MRA technique
without intravenous contrast. Carotid stenosis measurements (when
applicable) are obtained utilizing NASCET criteria, using the distal
internal carotid diameter as the denominator.

[Series 6: TOF · axial · B · 0.6mm · 0.52mm/px · z∈[-76,+119]mm · 37 of 341 slices shown]
[im 1/341]
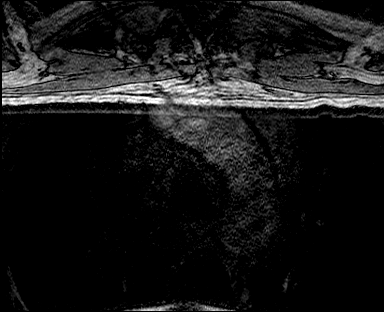
[im 8/341]
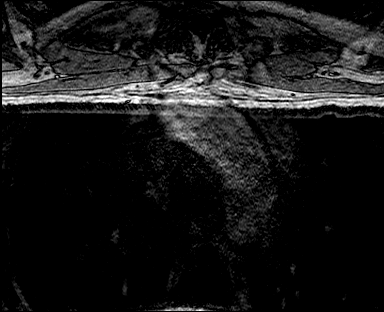
[im 15/341]
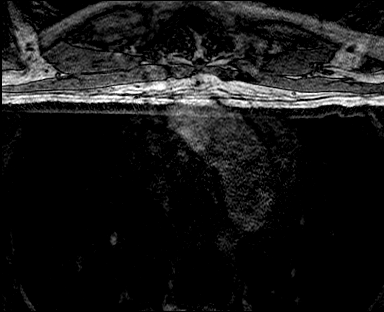
[im 22/341]
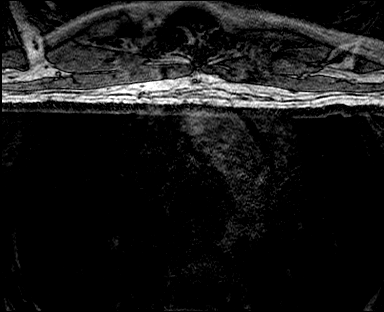
[im 29/341]
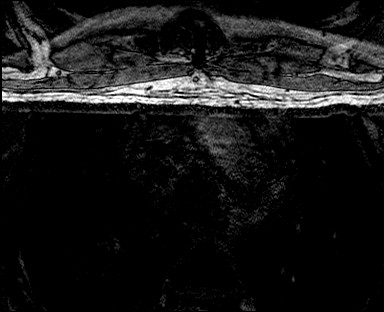
[im 37/341]
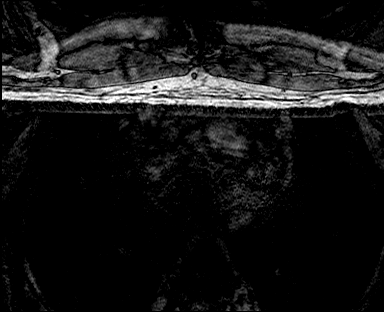
[im 44/341]
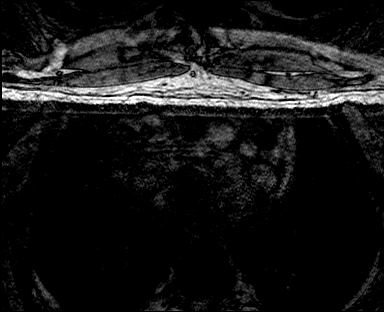
[im 51/341]
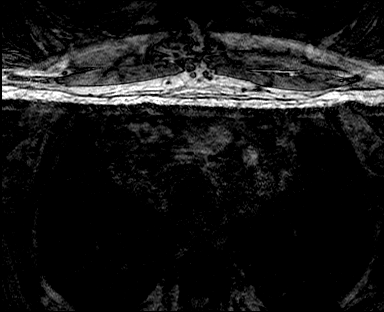
[im 58/341]
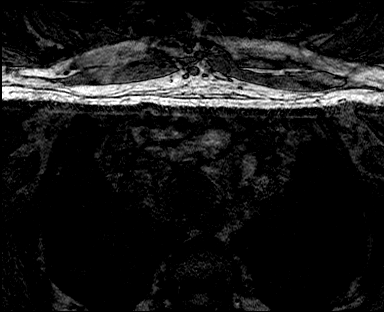
[im 66/341]
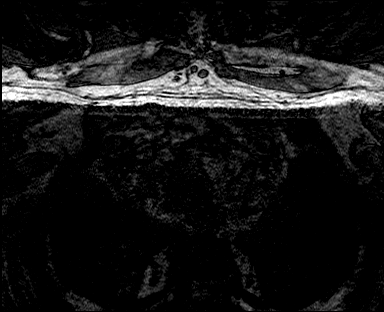
[im 73/341]
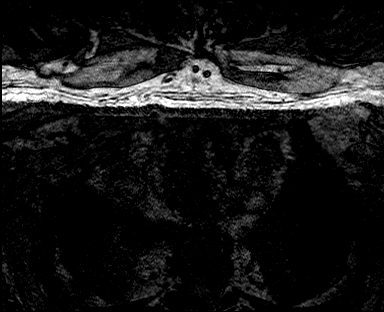
[im 80/341]
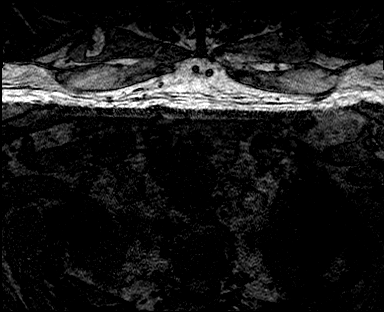
[im 87/341]
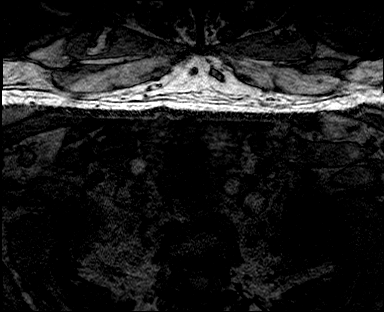
[im 95/341]
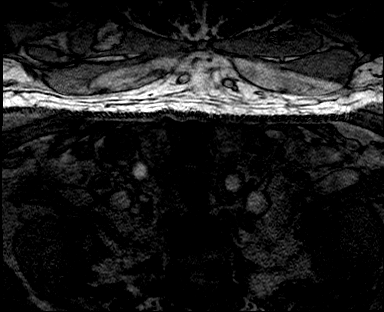
[im 102/341]
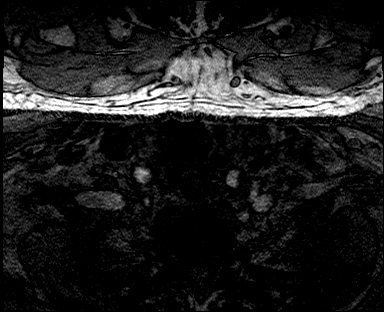
[im 109/341]
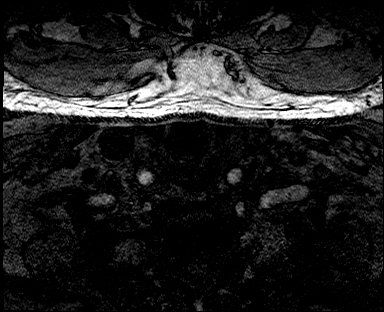
[im 116/341]
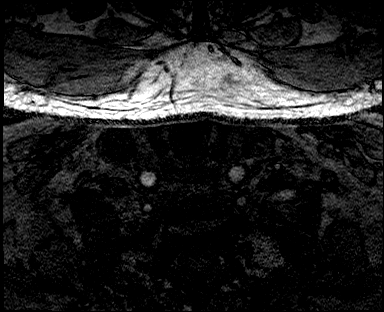
[im 123/341]
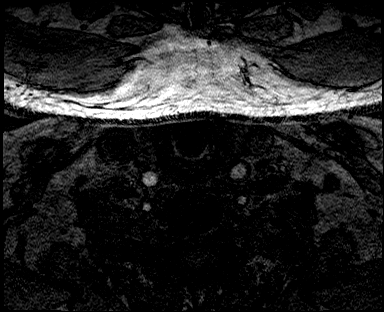
[im 131/341]
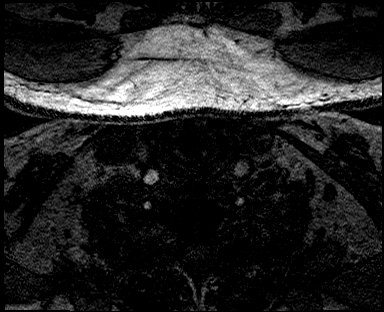
[im 138/341]
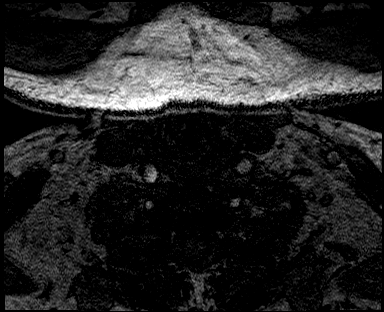
[im 145/341]
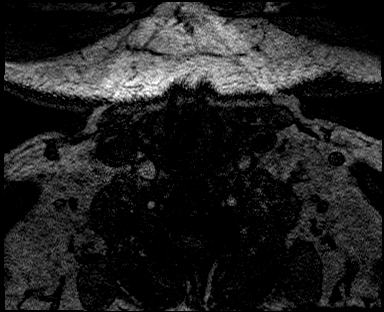
[im 152/341]
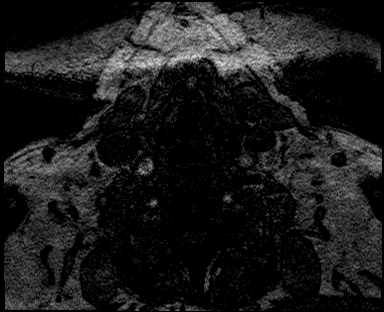
[im 160/341]
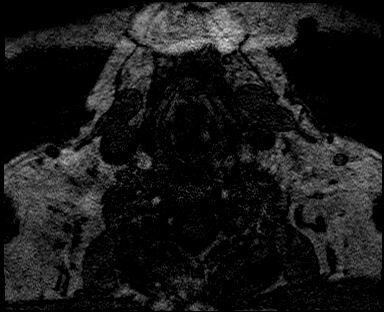
[im 167/341]
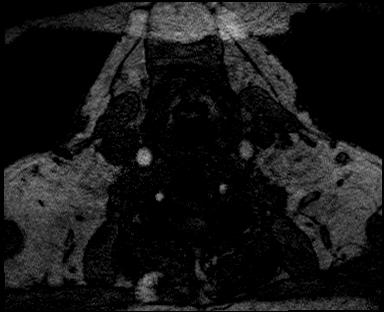
[im 174/341]
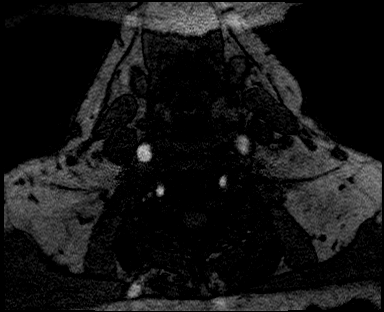
[im 181/341]
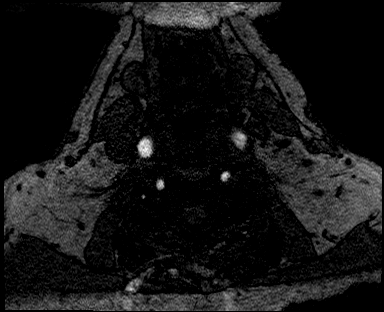
[im 189/341]
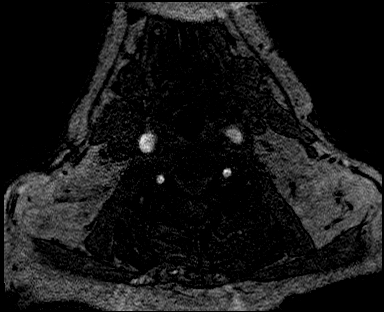
[im 196/341]
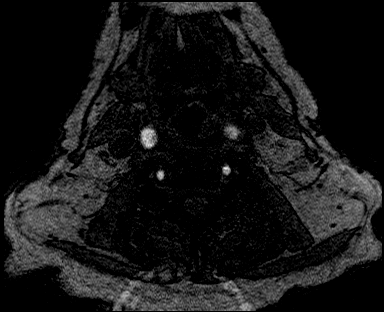
[im 203/341]
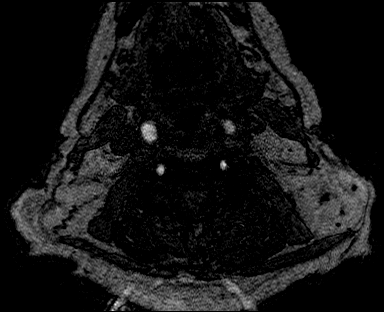
[im 210/341]
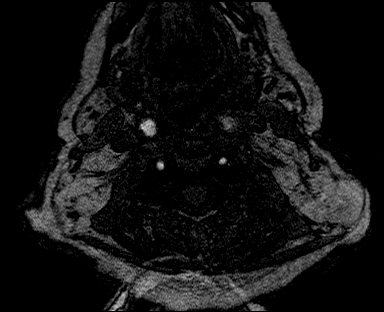
[im 218/341]
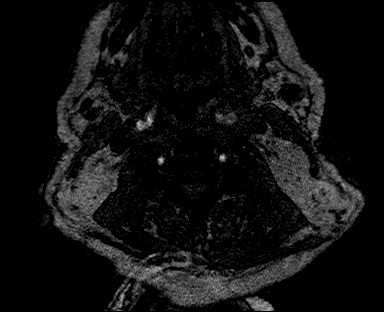
[im 225/341]
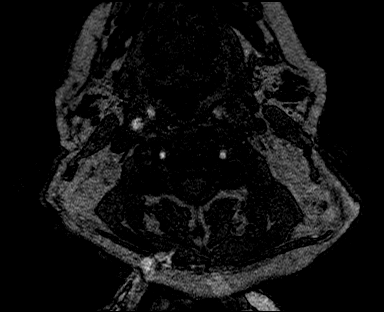
[im 232/341]
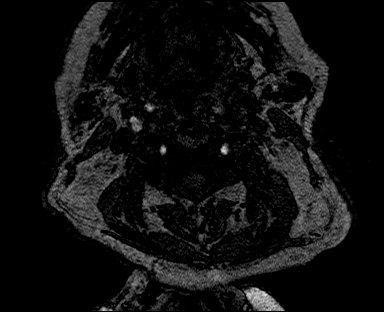
[im 239/341]
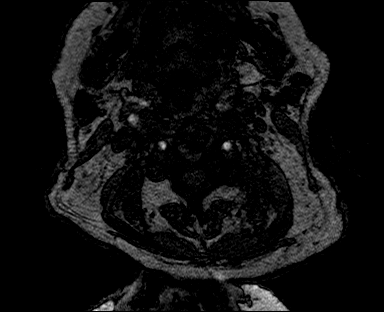
[im 283/341]
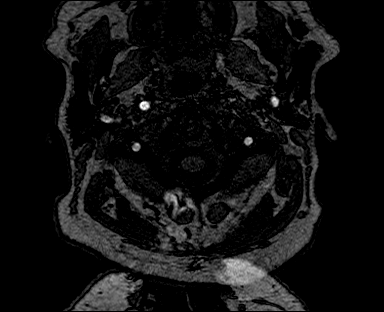
[im 290/341]
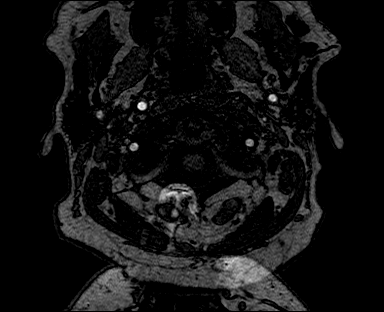
[im 326/341]
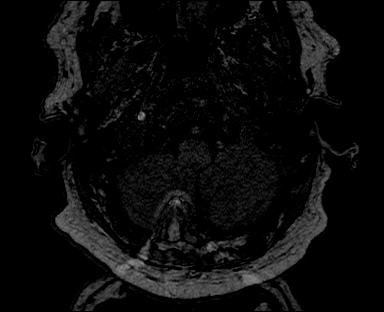

[37 of 48 positions shown; findings below may reference images not displayed]

FINDINGS: The origins of the common carotid and vertebral arteries are poorly
assessed on this examination due to pulsation artifact and
non-contrast technique.

Beyond its origin, the right common carotid artery is patent.
Irregular loss of flow related signal within the proximal/mid right
common carotid artery may reflect artifact. A stenosis/filling
defect at this site is difficult to exclude. Atherosclerotic
irregularity at the right carotid bifurcation. Apparent narrowing of
the proximal cervical right ICA with minimal diameter of 2.6 mm.
When compared with the normal diameter of the more distal cervical
ICA of 5.0, this corresponds with an estimated stenosis of
approximately 50%. Distal to this, the cervical right ICA is patent
without measurable stenosis.

Beyond its origin, the left common carotid artery is patent with
atherosclerotic irregularity and multifocal mild luminal narrowing.
Atherosclerotic irregularity at the left carotid bifurcation. There
is complete loss of flow related signal within the cervical left
internal carotid artery shortly beyond its origin. No flow related
signal is seen more distally within the cervical left ICA.

Beyond the vessel origins, the bilateral cervical vertebral arteries
are patent without measurable stenosis to the C1 level. The
intracranial vertebral arteries are poorly assessed on the current
examination.

Impression #1 below will be called to the ordering clinician or
representative by the Radiologist Assistant, and communication
documented in the PACS or zVision Dashboard.
IMPRESSION: - Complete loss of flow related signal within the cervical left ICA
shortly beyond its origin. Findings consistent with vessel occlusion
or severe stenosis.

- Apparent approximate 50% narrowing of the proximal cervical right
ICA.

- The origins of the bilateral common carotid and vertebral arteries
are poorly assessed on this exam due to pulsation artifact and
non-contrast technique.

- Beyond their origins, the bilateral common carotid arteries are
patent. Irregular loss of flow related signal within the
proximal/mid right common carotid artery may reflect artifact. A
stenosis/filling defect cannot be excluded. Consider carotid artery
ultrasound for further evaluation. Atherosclerosis with regions of
mild luminal narrowing in the left common carotid artery.

- Beyond their origins, the vertebral arteries are patent within the
neck without measurable stenosis to the C1 level. The intracranial
vertebral arteries are poorly assessed on this examination.

## 2019-03-13 MED ORDER — TAMSULOSIN HCL 0.4 MG PO CAPS
0.4000 mg | ORAL_CAPSULE | Freq: Every day | ORAL | Status: DC
  Administered 2019-03-13: 13:00:00 0.4 mg via ORAL
  Filled 2019-03-13: qty 1

## 2019-03-13 MED ORDER — CALCIUM GLUCONATE-NACL 2-0.675 GM/100ML-% IV SOLN
2.0000 g | Freq: Once | INTRAVENOUS | Status: AC
  Administered 2019-03-13: 2000 mg via INTRAVENOUS
  Filled 2019-03-13: qty 100

## 2019-03-13 MED ORDER — TAMSULOSIN HCL 0.4 MG PO CAPS: 0.4000 mg | ORAL_CAPSULE | Freq: Every day | ORAL | 1 refills | Status: DC

## 2019-03-13 NOTE — Progress Notes (Signed)
   03/13/19 1000  Clinical Encounter Type  Visited With Patient  Visit Type Follow-up  Referral From Nurse  [place] (Comment) (Advance Directives Education)  Ch responded to an OR requesting AD education. Pt was sitting up and welcomed the ch. 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 Pt enjoyed his work thoroughly and showed much passion as he shared the story. Pt became very lively as he talked. Ch engaged in active listening and reminisced with him his wonderful work Designer, jewellery. Pt was very appreciative of the visit.   Pt wants his wife to be his HCPOA and pt gave him the education and notified him that it is difficult to complete the document in the hospital due to visitor policies and informed him alternative ways to complete it.

## 2019-03-13 NOTE — Evaluation (Signed)
Physical Therapy Evaluation Patient Details Name: Dylan Goodman MRN: [id number] DOB: 1950-11-28 Today's Date: 03/13/2019   History of Present Illness  68 y.o. male with a known history of CAD status post CABG, atrial fibrillation on Eliquis, history of nasopharyngeal carcinoma, diabetes mellitus type, iron deficiency anemia, CKD stage IV presents to hospital secondary to dizziness, weakness and nausea.   Clinical Impression  Pt eager to get up and see how he does today as dizziness has been much less severe.  He was able to ambulate ~125 ft, but did not want to hazard the steps during first time up and walking since arrival.  Pt does not have walker at home, but did much better with it than w/o during gait training this date.  Orthostatic BPs taken w/o symptoms and pt did not have dizziness when getting to standing.  Pt safe to return home, will need to be able to do steps though per today's performance he should be able to do this w/ supervision.    Follow Up Recommendations Home health PT    Equipment Recommendations  Rolling walker with 5" wheels    Recommendations for Other Services       Precautions / Restrictions Precautions Precautions: Fall Restrictions Weight Bearing Restrictions: No      Mobility  Bed Mobility Overal bed mobility: Modified Independent             General bed mobility comments: Pt able to get to EOB w/o assist, only minimal brief lightheadedness  Transfers Overall transfer level: Modified independent Equipment used: Rolling walker (2 wheeled)             General transfer comment: multiple attempts at sit to stand with no direct assist needed.  Pt needing/wanting walker but not overly reliant on it  Ambulation/Gait Ambulation/Gait assistance: Supervision Gait Distance (Feet): 125 Feet Assistive device: Rolling walker (2 wheeled)       General Gait Details: Pt able to walk with relatively consistent cadence and minimal use of  walker.  He was also able to ambulate with single UE use of rail but was less steady w/o b/l UEs on AD  Stairs            Wheelchair Mobility    Modified Rankin (Stroke Patients Only)       Balance Overall balance assessment: Modified Independent                                           Pertinent Vitals/Pain Pain Assessment: No/denies pain    Home Living Family/patient expects to be discharged to:: Private residence Living Arrangements: Spouse/significant other;Children Available Help at Discharge: Family;Available 24 hours/day Type of Home: House Home Access: Level entry     Home Layout: Two level Home Equipment: Cane - single point      Prior Function Level of Independence: Independent         Comments: Pt normally able to drive, run errands, etc - has not for the last ~week secondary to dizziness     Hand Dominance        Extremity/Trunk Assessment   Upper Extremity Assessment Upper Extremity Assessment: Overall WFL for tasks assessed;Generalized weakness(no asymmetry or focal weakness)    Lower Extremity Assessment Lower Extremity Assessment: Overall WFL for tasks assessed;Generalized weakness(no asymmetry or focal weakness)       Communication   Communication: No difficulties  Cognition Arousal/Alertness: Awake/alert Behavior During Therapy: WFL for tasks assessed/performed Overall Cognitive Status: Within Functional Limits for tasks assessed                                        General Comments General comments (skin integrity, edema, etc.): orthostatics supine: 105/59, sit: 108/73, standing: 142/81    Exercises     Assessment/Plan    PT Assessment Patient needs continued PT services  PT Problem List Decreased strength;Decreased balance;Decreased knowledge of use of DME;Decreased range of motion;Decreased activity tolerance;Decreased mobility;Decreased safety awareness       PT Treatment  Interventions DME instruction;Gait training;Stair training;Functional mobility training;Therapeutic activities;Therapeutic exercise;Balance training;Neuromuscular re-education;Patient/family education    PT Goals (Current goals can be found in the Care Plan section)  Acute Rehab PT Goals Patient Stated Goal: stop being dizzy PT Goal Formulation: With patient Time For Goal Achievement: 03/27/19 Potential to Achieve Goals: Good    Frequency Min 2X/week   Barriers to discharge        Co-evaluation               AM-PAC PT "6 Clicks" Mobility  Outcome Measure Help needed turning from your back to your side while in a flat bed without using bedrails?: None Help needed moving from lying on your back to sitting on the side of a flat bed without using bedrails?: None Help needed moving to and from a bed to a chair (including a wheelchair)?: None Help needed standing up from a chair using your arms (e.g., wheelchair or bedside chair)?: None Help needed to walk in hospital room?: A Little Help needed climbing 3-5 steps with a railing? : A Little 6 Click Score: 22    End of Session Equipment Utilized During Treatment: Gait belt Activity Tolerance: Patient tolerated treatment well Patient left: with chair alarm set;with call bell/phone within reach;with family/visitor present Nurse Communication: Mobility status(orthos, O2 (room air) mid 90s to 88%, back to low 90s @ rest) PT Visit Diagnosis: Muscle weakness (generalized) (M62.81);Difficulty in walking, not elsewhere classified (R26.2)    Time: 3734-2876 PT Time Calculation (min) (ACUTE ONLY): 39 min   Charges:   PT Evaluation $PT Eval Low Complexity: 1 Low PT Treatments $Gait Training: 8-22 mins        Kreg Shropshire, DPT 03/13/2019, 4:01 PM

## 2019-03-13 NOTE — TOC Transition Note (Signed)
Transition of Care [facility]) - CM/SW Discharge Note   Patient Details  Name: Dylan Goodman MRN: [id number] Date of Birth: [birth year]  [facility] CM/SW Contact:  Jadah Bobak, Lenice Llamas Phone Number: [phone]  03/13/2019, 3:45 PM   Clinical Narrative: PT is recommending home health and a rolling walker. Clinical Social Worker (CSW) met with patient and his wife Mardene Celeste at bedside. CSW introduced self and explained role of CSW department. Patient was alert and oriented X4 and was sitting up in the chair. CSW explained home health services and provided wife with CMS home health list. Patient and wife do not have a preference of home health. Per [facility] representative they can accept patient. Patient and wife are agreeable to [place]. Patient and wife requested a rolling walker. [employer] agency representative is aware of above. Patient and wife reported no other needs or concerns. Please reconsult if future social work needs arise. CSW signing off.     Final next level of care: [place] Barriers to Discharge: Barriers Resolved   Patient Goals and CMS Choice Patient states their goals for this hospitalization and ongoing recovery are:: To go home. CMS Medicare.gov Compare Post Acute Care list provided to:: Patient Represenative (must comment)(Patient's daughter Mardene Celeste.) Choice offered to / list presented to : Spouse  Discharge Placement                  Name of family member notified: Patient's wife Mardene Celeste is aware of D/C today. Patient and family notified of of transfer: 03/13/19  Discharge Plan and Services                DME Arranged: [place]ford Rile rolling DME Agency: [employer] Date DME Agency Contacted: 03/13/19 Time DME Agency Contacted: [phone] Representative spoke with at DME Agency: [place]: PT, RN [place] Agency: [place] ([place]) Date [place]: 03/13/19 Time [place]: 1544 Representative spoke with at [place]: [place] ([place]) Interventions     Readmission Risk Interventions No flowsheet data found.

## 2019-03-13 NOTE — Consult Note (Signed)
[facility] Vascular Consult Note  MRN : [id number]  Dylan Goodman is Dylan 68 y.o. (Feb 23, 1951) male who presents with chief complaint of  Chief Complaint  Patient presents with  . Nausea  . Emesis   History of Present Illness:  The patient is Dylan 68 year old male with multiple medical issues (see below) who presents to the [facility] emergency department on March 12, 2019 with Dylan chief complaint of chronic diarrhea, nausea, emesis and dizziness.  Patient endorses Dylan history of progressively worsening "dizziness" which started approximately 1 week ago.  He describes this dizziness as mostly "lightheadedness".  The patient notes that he has been diagnosed with vertigo in the past however states this feels "different".  The patient was supposed to see an ENT and get an MRI as an outpatient however his symptoms worsened which prompted him to seek medical attention.  MRI Brain (03/12/19): 1. 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 Carotid Doppler or CTA should evaluate further. 2.  No acute intracranial abnormality. 3. Mild for age chronic small vessel disease in the caudate nuclei. 4. Possible mild superficial siderosis of the left superior frontal gyrus, such as due to Dylan prior subarachnoid hemorrhage.  Due to the patient's chronic kidney disease CTA of the neck was not obtained.  MRA neck (03/13/19): Complete loss of flow related signal within the cervical left ICA shortly beyond its origin. Findings consistent with vessel occlusion or severe stenosis. - Apparent approximate 50% narrowing of the proximal cervical right ICA. - The origins of the bilateral common carotid and vertebral arteries are poorly assessed on this exam due to pulsation artifact and non-contrast technique. - Beyond their origins, the bilateral common carotid arteries are patent. Irregular loss of flow  related signal within the proximal/mid right common carotid artery may reflect artifact. Dylan stenosis/filling defect cannot be excluded. Consider carotid artery ultrasound for further evaluation. Atherosclerosis with regions of mild luminal narrowing in the left common carotid artery. - Beyond their origins, the vertebral arteries are patent within the neck without measurable stenosis to the C1 level. The intracranial vertebral arteries are poorly assessed on this examination.  Vascular surgery was consulted by Dr. Tressia Miners for further recommendations. Current Facility-Administered Medications  Medication Dose Route Frequency Provider Last Rate Last Dose  . 0.9 %  sodium chloride infusion   Intravenous Continuous Gladstone Lighter, MD 75 mL/hr at 03/13/19 1347    . acetaminophen (TYLENOL) tablet 650 mg  650 mg Oral Q6H PRN Gladstone Lighter, MD       Or  . acetaminophen (TYLENOL) suppository 650 mg  650 mg Rectal Q6H PRN Gladstone Lighter, MD      . amLODipine (NORVASC) tablet 5 mg  5 mg Oral Daily Gladstone Lighter, MD   5 mg at 03/13/19 0948  . apixaban (ELIQUIS) tablet 2.5 mg  2.5 mg Oral BID Gladstone Lighter, MD   2.5 mg at 03/13/19 0948  . carvedilol (COREG) tablet 6.25 mg  6.25 mg Oral BID Gladstone Lighter, MD   6.25 mg at 03/13/19 0948  . cilostazol (PLETAL) tablet 100 mg  100 mg Oral BID Gladstone Lighter, MD   100 mg at 03/13/19 0948  . ezetimibe (ZETIA) tablet 10 mg  10 mg Oral Daily Gladstone Lighter, MD   10 mg at 03/13/19 0948  . fenofibrate tablet 160 mg  160 mg Oral Daily Gladstone Lighter, MD  160 mg at 03/13/19 0948  . insulin aspart (novoLOG) injection 0-5 Units  0-5 Units Subcutaneous QHS Gladstone Lighter, MD      . insulin aspart (novoLOG) injection 0-9 Units  0-9 Units Subcutaneous TID WC Gladstone Lighter, MD   1 Units at 03/13/19 1243  . isosorbide mononitrate (IMDUR) 24 hr tablet 120 mg  120 mg Oral Daily Gladstone Lighter, MD   120 mg at 03/13/19 1321  .  losartan (COZAAR) tablet 25 mg  25 mg Oral Daily Gladstone Lighter, MD   25 mg at 03/13/19 0948  . nitroGLYCERIN (NITROSTAT) SL tablet 0.4 mg  0.4 mg Sublingual Q5 min PRN Gladstone Lighter, MD      . ondansetron [facility]) tablet 4 mg  4 mg Oral Q6H PRN Gladstone Lighter, MD       Or  . ondansetron (ZOFRAN) injection 4 mg  4 mg Intravenous Q6H PRN Gladstone Lighter, MD      . pantoprazole (PROTONIX) EC tablet 20 mg  20 mg Oral Daily Gladstone Lighter, MD   20 mg at 03/13/19 1044  . pravastatin (PRAVACHOL) tablet 40 mg  40 mg Oral q1800 Gladstone Lighter, MD   40 mg at 03/12/19 1824  . promethazine (PHENERGAN) injection 12.5 mg  12.5 mg Intravenous Q6H PRN Merlyn Lot, MD      . sodium bicarbonate tablet 650 mg  650 mg Oral Daily Gladstone Lighter, MD   650 mg at 03/13/19 0948  . sodium chloride flush (NS) 0.9 % injection 3 mL  3 mL Intravenous Once Merlyn Lot, MD      . tamsulosin [facility]) capsule 0.4 mg  0.4 mg Oral Daily Gladstone Lighter, MD   0.4 mg at 03/13/19 1245  . ticagrelor (BRILINTA) tablet 60 mg  60 mg Oral BID Gladstone Lighter, MD   60 mg at 03/13/19 8588   Past Medical History:  Diagnosis Date  . Bleeding gastrointestinal 09/03/2018  . 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 Chronic depression 09/03/2018  . CKD (chronic kidney disease) 09/03/2018  . Coronary atherosclerosis 09/03/2018  . Coronary bypass graft mechanical complication 50/2774  . Diabetes mellitus without complication ([place])   . Gastric ulcer with hemorrhage 09/03/2018  . Gastritis 09/03/2018  . GERD (gastroesophageal reflux disease) 09/03/2018  . Heart disease   . Heart murmur   . IDA (iron deficiency anemia) 09/03/2018  . Low magnesium levels 09/03/2018  . 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 Myocardial infarction ([place]) 2017  . Personal history of radiation therapy 05/30/2018   39 treatments with chemotherapy nasopharyngeal cancer  . 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 CAROTID ENDARTERECTOMY  2006  . COLONOSCOPY WITH PROPOFOL N/Dylan 03/11/2019   Procedure: COLONOSCOPY WITH PROPOFOL;  Surgeon: Toledo, Benay Pike, MD;  Location: [facility] ENDOSCOPY;  Service: Gastroenterology;  Laterality: N/Dylan;  . CORONARY ARTERY BYPASS GRAFT  1996   quadruple bypass  . ESOPHAGOGASTRODUODENOSCOPY (EGD) WITH PROPOFOL N/Dylan 03/11/2019   Procedure: ESOPHAGOGASTRODUODENOSCOPY (EGD) WITH PROPOFOL;  Surgeon: Toledo, Benay Pike, MD;  Location: [facility] ENDOSCOPY;  Service: Gastroenterology;  Laterality: N/Dylan;  . heart stent  10/2010   x3  . 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 left leg stents  08/2009  . LOWER EXTREMITY ANGIOGRAPHY Left 11/11/2018   Procedure: LOWER EXTREMITY ANGIOGRAPHY;  Surgeon: Katha Cabal, MD;  Location: [place] [facility];  Service: Cardiovascular;  Laterality:  Left;  . REVISION OF AORTA BIFEMORAL BYPASS    . right femo pop bypass  06/1998   11/2001, 08/2003  . right leg stent  07/2009   Social History Social History   Tobacco Use  . Smoking status: Former Smoker    Packs/day: 1.50    Years: 50.00    Pack years: 75.00    Types: Cigarettes    Quit date: 02/18/2017    Years since quitting: 2.0  . Smokeless tobacco: Never Used  Substance Use Topics  . Alcohol use: Not Currently  . Drug use: Never   Family History Family History  Problem Relation Age of Onset  . Coronary artery disease Mother   . Alcohol abuse Father   . Colon cancer Father   . Coronary artery disease Father   . Kidney cancer Father   . Breast cancer Sister   Denies family history of peripheral artery disease, venous disease or bleeding/clotting disorders..  No Known Allergies  REVIEW OF SYSTEMS (Negative unless checked)  Constitutional: [] Weight loss  [] Fever  [] Chills Cardiac: [] Chest pain   [] Chest pressure   [] Palpitations   [] Shortness of breath when laying flat    [] Shortness of breath at rest   [] Shortness of breath with exertion. Vascular:  [] Pain in legs with walking   [] Pain in legs at rest   [] Pain in legs when laying flat   [] Claudication   [] Pain in feet when walking  [] Pain in feet at rest  [] Pain in feet when laying flat   [] History of DVT   [] Phlebitis   [] Swelling in legs   [] Varicose veins   [] Non-healing ulcers Pulmonary:   [] Uses home oxygen   [] Productive cough   [] Hemoptysis   [] Wheeze  [] COPD   [] Asthma Neurologic:  [x] Dizziness  [] Blackouts   [] Seizures   [] History of stroke   [] History of TIA  [] Aphasia   [] Temporary blindness   [] Dysphagia   [] Weakness or numbness in arms   [] Weakness or numbness in legs Musculoskeletal:  [] Arthritis   [] Joint swelling   [] Joint pain   [] Low back pain Hematologic:  [] Easy bruising  [] Easy bleeding   [] Hypercoagulable state   [x] Anemic  [] Hepatitis Gastrointestinal:  [] Blood in stool   [] Vomiting blood  [x] Gastroesophageal reflux/heartburn   [] Difficulty swallowing. Genitourinary:  [x] Chronic kidney disease   [] Difficult urination  [] Frequent urination  [] Burning with urination   [] Blood in urine Skin:  [] Rashes   [] Ulcers   [] Wounds Psychological:  [] History of anxiety   []  History of major depression.  Physical Examination  Vitals:   03/12/19 1030 03/12/19 1712 03/12/19 1938 03/13/19 0632  BP: (!) 138/92 (!) 133/100 130/72 109/62  Pulse: 100 (!) 123 (!) 119 85  Resp: (!) 24 16 18 18   Temp:  97.8 F (36.6 C) (!) 97.5 F (36.4 C) 97.7 F (36.5 C)  TempSrc:  Oral  Oral  SpO2: 93% 96% 94% 94%  Weight:  99.7 kg    Height:  6' (1.829 m)     Body mass index is 29.81 kg/m. Gen:  WD/WN, NAD Head: [place]/AT, No temporalis wasting. Prominent temp pulse not noted. Ear/Nose/Throat: Hearing grossly intact, nares w/o erythema or drainage, oropharynx w/o Erythema/Exudate Eyes: Sclera non-icteric, conjunctiva clear Neck: Trachea midline.  No JVD.  Pulmonary:  Good air movement, respirations not labored, equal  bilaterally.  Cardiac: RRR, normal S1, S2. Vascular:  Vessel Right Left  Radial Palpable Palpable  Ulnar Palpable Palpable  Brachial Palpable Palpable  Carotid Palpable, without bruit Palpable,  without bruit  Aorta Not palpable N/Dylan  Femoral Palpable Palpable  Popliteal Palpable Palpable  PT Palpable Palpable  DP Palpable Palpable   Gastrointestinal: soft, non-tender/non-distended. No guarding/reflex.  Musculoskeletal: M/S 5/5 throughout.  Extremities without ischemic changes.  No deformity or atrophy. No edema. Neurologic: Sensation grossly intact in extremities.  Symmetrical.  Speech is fluent. Motor exam as listed above. Psychiatric: Judgment intact, Mood & affect appropriate for pt's clinical situation. Dermatologic: No rashes or ulcers noted.  No cellulitis or open wounds. Lymph : No Cervical, Axillary, or Inguinal lymphadenopathy.  CBC Lab Results  Component Value Date   WBC 11.2 (H) 03/13/2019   HGB 12.0 (L) 03/13/2019   HCT 36.7 (L) 03/13/2019   MCV 90.6 03/13/2019   PLT 211 03/13/2019   BMET    Component Value Date/Time   NA 140 03/13/2019 0848   K 3.9 03/13/2019 0848   CL 110 03/13/2019 0848   CO2 19 (L) 03/13/2019 0848   GLUCOSE 139 (H) 03/13/2019 0848   BUN 58 (H) 03/13/2019 0848   CREATININE 2.95 (H) 03/13/2019 0848   CREATININE 3.36 (H) 02/12/2019 1450   CALCIUM 6.4 (LL) 03/13/2019 0848   GFRNONAA 21 (L) 03/13/2019 0848   GFRNONAA 18 (L) 02/12/2019 1450   GFRAA 25 (L) 03/13/2019 0848   GFRAA 21 (L) 02/12/2019 1450   Estimated Creatinine Clearance: 30.5 mL/min (Dylan) (by C-G formula based on SCr of 2.95 mg/dL (H)).  COAG Lab Results  Component Value Date   INR 1.3 (H) 03/12/2019   Radiology Ct Abdomen Pelvis Wo Contrast  Result Date: 03/12/2019 CLINICAL DATA:  Nausea vomiting post EGD. EXAM: CT ABDOMEN AND PELVIS WITHOUT CONTRAST TECHNIQUE: Multidetector CT imaging of the abdomen and pelvis was performed following the standard protocol without IV  contrast. COMPARISON:  None. FINDINGS: Lower chest: Calcific atherosclerotic disease of the coronary arteries and aorta. Hepatobiliary: 1.8 cm benign-appearing cyst in the dome of the liver. Pancreas: Unremarkable. No pancreatic ductal dilatation or surrounding inflammatory changes. Spleen: Normal in size without focal abnormality. Adrenals/Urinary Tract: Adrenal glands are unremarkable. Kidneys are normal, without renal calculi, focal lesion, or hydronephrosis. Bladder is unremarkable. Stomach/Bowel: Diffuse thickening of the wall of the gastric body and cardia, with multiple gas bubbles associated with the thickened wall. No extraluminal gas. The stomach is not opacified and therefore the internal architecture of the gastric wall could not be evaluated. No evidence of small-bowel obstruction. Diffuse colonic diverticulosis without evidence of diverticulitis. Vascular/Lymphatic: Advanced atherosclerotic disease with tortuosity of the aorta. Fusiform infrarenal abdominal aortic aneurysm which measures 4 cm. Additional saccular aneurysm of the distal abdominal aorta which measures 3.8 cm, image 49/104, sequence 2. No enlarged abdominal or pelvic lymph nodes. Reproductive: Enlargement of the prostate gland. Other: Coil artifact associated with the left iliac vessels. Musculoskeletal: Spondylosis of the spine. IMPRESSION: 1. Diffuse thickening of the wall of the gastric body and cardia, with multiple gas bubbles associated with the thickened wall. The stomach is not opacified and therefore the internal architecture of the gastric wall could not be evaluated. No extraluminal gas. 2. Advanced atherosclerotic disease of the aorta and coronary arteries. 3. Fusiform infrarenal abdominal aortic aneurysm which measures 4 cm. Additional saccular aneurysm of the distal abdominal aorta which measures 3.8 cm. Evaluation with CTA or MRA of the abdomen may be considered, if found clinically appropriate. 4. Enlargement of the  prostate gland. Please correlate to serum PSA values. Electronically Signed   By: Fidela Salisbury M.D.   On: 03/12/2019 09:23  Dg Chest 2 View  Result Date: 03/12/2019 CLINICAL DATA:  Nausea and vomiting post upper endoscopy. Possible aspiration. EXAM: CHEST - 2 VIEW COMPARISON:  None. FINDINGS: Mild cardiomegaly. Aortic atherosclerosis. Mild hyperinflation in diffuse pulmonary interstitial prominence, consistent with COPD. No evidence of pulmonary airspace disease or pleural effusion. Prior CABG noted. IMPRESSION: Mild cardiomegaly and COPD. No active lung disease. Electronically Signed   By: Marlaine Hind M.D.   On: 03/12/2019 08:09   Mr Angio Neck Wo Contrast  Result Date: 03/13/2019 CLINICAL DATA:  Vertigo, persistent, central dizziness, TIA EXAM: MRA NECK WITHOUT CONTRAST TECHNIQUE: Angiographic images of the neck were obtained using MRA technique without intravenous contrast. Carotid stenosis measurements (when applicable) are obtained utilizing NASCET criteria, using the distal internal carotid diameter as the denominator. COMPARISON:  Brain MRI 03/12/2019 FINDINGS: The origins of the common carotid and vertebral arteries are poorly assessed on this examination due to pulsation artifact and non-contrast technique. Beyond its origin, the right common carotid artery is patent. Irregular loss of flow related signal within the proximal/mid right common carotid artery may reflect artifact. Dylan stenosis/filling defect at this site is difficult to exclude. Atherosclerotic irregularity at the right carotid bifurcation. Apparent narrowing of the proximal cervical right ICA with minimal diameter of 2.6 mm. When compared with the normal diameter of the more distal cervical ICA of 5.0, this corresponds with an estimated stenosis of approximately 50%. Distal to this, the cervical right ICA is patent without measurable stenosis. Beyond its origin, the left common carotid artery is patent with atherosclerotic  irregularity and multifocal mild luminal narrowing. Atherosclerotic irregularity at the left carotid bifurcation. There is complete loss of flow related signal within the cervical left internal carotid artery shortly beyond its origin. No flow related signal is seen more distally within the cervical left ICA. Beyond the vessel origins, the bilateral cervical vertebral arteries are patent without measurable stenosis to the C1 level. The intracranial vertebral arteries are poorly assessed on the current examination. Impression #1 below will be called to the ordering clinician or representative by the Radiologist Assistant, and communication documented in the PACS or zVision Dashboard. IMPRESSION: - Complete loss of flow related signal within the cervical left ICA shortly beyond its origin. Findings consistent with vessel occlusion or severe stenosis. - Apparent approximate 50% narrowing of the proximal cervical right ICA. - The origins of the bilateral common carotid and vertebral arteries are poorly assessed on this exam due to pulsation artifact and non-contrast technique. - Beyond their origins, the bilateral common carotid arteries are patent. Irregular loss of flow related signal within the proximal/mid right common carotid artery may reflect artifact. Dylan stenosis/filling defect cannot be excluded. Consider carotid artery ultrasound for further evaluation. Atherosclerosis with regions of mild luminal narrowing in the left common carotid artery. - Beyond their origins, the vertebral arteries are patent within the neck without measurable stenosis to the C1 level. The intracranial vertebral arteries are poorly assessed on this examination. Electronically Signed   By: Kellie Simmering   On: 03/13/2019 14:37   Mr Brain Wo Contrast  Result Date: 03/12/2019 CLINICAL DATA:  68 year old male with persistent vertigo.  Diarrhea. EXAM: MRI HEAD WITHOUT CONTRAST TECHNIQUE: Multiplanar, multiecho pulse sequences of the brain  and surrounding structures were obtained without intravenous contrast. COMPARISON:  None. FINDINGS: Brain: No restricted diffusion to suggest acute infarction. No midline shift, mass effect, evidence of mass lesion, ventriculomegaly, extra-axial collection or acute intracranial hemorrhage. Cervicomedullary junction and pituitary are within normal limits. No  cortical encephalomalacia identified. However, there may be mild superficial siderosis of the left superior frontal gyrus on series 8, image 22. No other chronic blood products identified though. Cerebral white matter signal is normal for age. There are small chronic lacunar infarcts of the bilateral caudate greater on the right (series 7, image 16). Negative other deep gray nuclei, brainstem and cerebellum. Vascular: The left ICA siphon flow void is asymmetrically diminished (series 7, images 7 and 8) with evidence of reconstituted flow at the left ICA terminus. Other Major intracranial vascular flow voids are preserved. Skull and upper cervical spine: Negative visible cervical spine. Visualized bone marrow signal is within normal limits. Sinuses/Orbits: Negative orbits.  Paranasal sinuses are clear. Other: Grossly normal visible internal auditory structures. Mild right inferior mastoid fluid. Small volume retained secretions in the nasopharynx. Scalp and face soft tissues appear negative. IMPRESSION: 1. 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 Carotid Doppler or CTA should evaluate further. 2.  No acute intracranial abnormality. 3. Mild for age chronic small vessel disease in the caudate nuclei. 4. Possible mild superficial siderosis of the left superior frontal gyrus, such as due to Dylan prior subarachnoid hemorrhage. #1 will be called to the ordering clinician or representative by the Radiologist Assistant, and communication documented in the PACS or zVision Dashboard. Electronically Signed    By: Genevie Ann M.D.   On: 03/12/2019 22:28   Mr Hip Left Wo Contrast  Result Date: 03/12/2019 CLINICAL DATA:  Chronic hip pain, tendonitis EXAM: MR OF THE LEFT HIP WITHOUT CONTRAST TECHNIQUE: Multiplanar, multisequence MR imaging was performed. No intravenous contrast was administered. COMPARISON:  CT March 12, 2019 FINDINGS: Bones: There is no evidence of acute fracture, dislocation or avascular necrosis. The visualized bony pelvis appears normal. The visualized sacroiliac joints and symphysis pubis appear normal. Articular cartilage and labrum Articular cartilage: There is mild chondral thinning seen at the superior femoroacetabular joint. Labrum: There is no gross labral tear or paralabral abnormality. Joint or bursal effusion Joint effusion: No significant hip joint effusion. Bursae: No focal periarticular fluid collection. Muscles and tendons Muscles and tendons: There is mild gluteal insertional tendinosis. The remainder of the tendons are intact. The piriformis muscles appear symmetric. Other findings Miscellaneous: The visualized internal pelvic contents appear unremarkable. Degenerative changes are seen in the lower lumbar spine. IMPRESSION: Mild femoroacetabular joint osteoarthritis. Mild insertional gluteal tendinosis. Electronically Signed   By: Prudencio Pair M.D.   On: 03/12/2019 22:51   Assessment/Plan The patient is Dylan 68 year old male with multiple medical issues (see below) who presents to the [facility] emergency department on March 12, 2019 with Dylan chief complaint of chronic diarrhea, nausea, emesis and dizziness. 1.  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  Unfortunately, there is no open or endovascular intervention that can be offered for an occluded carotid vessel.  Patient is on Eliquis, Pletal and Brilinta.  Would be happy to follow in the outpatient setting as the patient does have disease in the right carotid  arterial system.  This was all explained to the patient.  All his questions were answered.  The patient is in agreement. 2.  Diabetes: On appropriate medications.Encouraged good control as its slows the progression of atherosclerotic disease 3. CAD: On appropriate medications.  Asymptomatic at this time.  This is followed by patient's cardiologist.  Discussed with Dr. Eber Hong  Dylan Hershy Flenner, PA-C  03/13/2019 3:16 PM  This note was created with Dragon medical transcription system.  Any error is purely unintentional

## 2019-03-13 NOTE — Discharge Summary (Signed)
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 ([place]) 05/2008   Chronic depression 09/03/2018   CKD (chronic kidney disease) 09/03/2018   Coronary atherosclerosis 09/03/2018   Coronary bypass graft mechanical complication 03/9832   Diabetes mellitus without complication ([place])    Gastric ulcer with hemorrhage 09/03/2018   Gastritis 09/03/2018   GERD (gastroesophageal reflux disease) 09/03/2018   Heart disease    Heart murmur    IDA (iron deficiency anemia) 09/03/2018   Low magnesium levels 09/03/2018   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 male with a known history of CAD status post CABG, atrial fibrillation on Eliquis, history of nasopharyngeal carcinoma, diabetes mellitus type, iron deficiency anemia, CKD stage IV presents to hospital secondary to dizziness, weakness and nausea today.   1. Dizziness-feels much better now. Not sure if he got dehydrated after the colon prep and several episodes of diarrhea.  However had the symptoms even prior to his scopes as he visited his PCP for the same. -Patient does have chronic diarrhea. Could not tolerate  orthostatics to be checked yesterday.  Recheck today and no orthostatic hypotension. -Ambulated well with physical therapy -MRI of the brain was negative for any infarcts but showing high-grade stenosis in the left ICA in the neck region. Has chronic small vessel ischemic disease changes.  -Also recently started on Ranexa which has been held.  Improved symptoms today   2.  Left ICA stenosis-patient has chronic left ICA stenosis.  No stroke on MRI brain.  Given his dizziness symptoms, requested vascular consult for his left ICA stenosis.  -Since it is completely occluded.  No further treatment recommended for that.  Follow-up with vascular for right ICA -On Brilinta and statin   2. CAD status post CABG-continue Brilinta, statin, Coreg, losartan and Imdur low normal,  -Hold Ranexa as dizziness symptoms started recently and that is a new medication for him as well   3. Hypertension-on Coreg, losartan, Imdur and Norvasc.    4. Diabetes-continue home medications-patient on Tresiba  5. PAD-on Pletal and statin   6. A. Fib- On Coreg and Eliquis   7. Acute urinary retention-required in and out catheterization once on admission.  Voiding well now.  Started on Flomax  8. Hypocalcemia- replaced.  Phosphorus is within normal limits.  Vitamin D levels have been ordered and pending  Independent at baseline    PT consulted, patient ambulated well with them today   DISCHARGE CONDITIONS:   Guarded  CONSULTS OBTAINED:   Treatment Team:  Katha Cabal, MD  DRUG ALLERGIES:   No Known Allergies DISCHARGE MEDICATIONS:   Allergies as of 03/13/2019   No Known Allergies  Medication List    STOP taking these medications   ranolazine 500 MG 12 hr tablet Commonly known as: RANEXA     TAKE these medications   amLODipine 5 MG tablet Commonly known as: NORVASC Take 5 mg by mouth daily.   carvedilol 6.25 MG tablet Commonly known as: COREG Take 6.25 mg by mouth 2 (two) times  daily.   cilostazol 100 MG tablet Commonly known as: PLETAL Take 100 mg by mouth 2 (two) times daily.   Eliquis 2.5 MG Tabs tablet Generic drug: apixaban Take 2.5 mg by mouth 2 (two) times daily.   ezetimibe 10 MG tablet Commonly known as: ZETIA Take 10 mg by mouth daily.   fenofibrate 160 MG tablet Take 160 mg by mouth daily.   isosorbide mononitrate 120 MG 24 hr tablet Commonly known as: IMDUR Take 120 mg by mouth daily.   lansoprazole 30 MG capsule Commonly known as: PREVACID Take 30 mg by mouth every morning.   losartan 25 MG tablet Commonly known as: COZAAR Take 25 mg by mouth daily.   lovastatin 40 MG tablet Commonly known as: MEVACOR Take 40 mg by mouth 2 (two) times daily.   nitroGLYCERIN 0.4 MG SL tablet Commonly known as: NITROSTAT Place 0.4 mg under the tongue every 5 (five) minutes as needed for chest pain.   sodium bicarbonate 650 MG tablet Take 1 tablet by mouth daily.   tamsulosin 0.4 MG Caps capsule Commonly known as: FLOMAX Take 1 capsule (0.4 mg total) by mouth daily. Start taking on: March 14, 2019   ticagrelor 60 MG Tabs tablet Commonly known as: BRILINTA Take 60 mg by mouth 2 (two) times daily.   Tyler Aas FlexTouch 200 UNIT/ML Sopn Generic drug: Insulin Degludec Inject 12 Units into the skin at bedtime.        DISCHARGE INSTRUCTIONS:   1. PCP f/u in 1-2 weeks 2. Vascular f/u in 3-4 weeks  DIET:   Cardiac diet and Diabetic diet  ACTIVITY:   Activity as tolerated  OXYGEN:   Home Oxygen: No.  Oxygen Delivery: room air  DISCHARGE LOCATION:   home   If you experience worsening of your admission symptoms, develop shortness of breath, life threatening emergency, suicidal or homicidal thoughts you must seek medical attention immediately by calling 911 or calling your MD immediately  if symptoms less severe.  You Must read complete instructions/literature along with all the possible adverse reactions/side effects for all the  Medicines you take and that have been prescribed to you. Take any new Medicines after you have completely understood and accpet all the possible adverse reactions/side effects.   Please note  You were cared for by a hospitalist during your hospital stay. If you have any questions about your discharge medications or the care you received while you were in the hospital after you are discharged, you can call the unit and asked to speak with the hospitalist on call if the hospitalist that took care of you is not available. Once you are discharged, your primary care physician will handle any further medical issues. Please note that NO REFILLS for any discharge medications will be authorized once you are discharged, as it is imperative that you return to your primary care physician (or establish a relationship with a primary care physician if you do not have one) for your aftercare needs so that they can reassess your need for medications and monitor your lab values.    On the day of Discharge:  VITAL SIGNS:  Blood pressure 109/62, pulse 85, temperature 97.7 F (36.5 C), temperature source Oral, resp. rate 18, height 6' (1.829 m), weight 99.7 kg, SpO2 94 %.  PHYSICAL EXAMINATION:    GENERAL:  68 y.o.-year-old patient lying in the bed with no acute distress.  EYES: Pupils equal, round, reactive to light and accommodation. No scleral icterus. Extraocular muscles intact.  HEENT: Head atraumatic, normocephalic. Oropharynx and nasopharynx clear.  NECK:  Supple, no jugular venous distention. No thyroid enlargement, no tenderness.  LUNGS: Normal breath sounds bilaterally, no wheezing, rales,rhonchi or crepitation. No use of accessory muscles of respiration.  CARDIOVASCULAR: S1, S2 normal. No  rubs, or gallops.  2/6 systolic murmur is present ABDOMEN: Soft, nontender, nondistended. Bowel sounds present. No organomegaly or mass.  EXTREMITIES: No pedal edema, cyanosis, or clubbing.  NEUROLOGIC: Cranial  nerves II through XII are intact.  No nystagmus on exam. muscle strength 5/5 in all extremities. Sensation intact. Gait not checked.  PSYCHIATRIC: The patient is alert and oriented x 3.  SKIN: No obvious rash, lesion, or ulcer.   DATA REVIEW:   CBC Recent Labs  Lab 03/13/19 0848  WBC 11.2*  HGB 12.0*  HCT 36.7*  PLT 211    Chemistries  Recent Labs  Lab 03/12/19 0714 03/13/19 0848  NA 138 140  K 3.4* 3.9  CL 108 110  CO2 15* 19*  GLUCOSE 140* 139*  BUN 48* 58*  CREATININE 2.87* 2.95*  CALCIUM 6.8* 6.4*  AST 25  --   ALT 20  --   ALKPHOS 62  --   BILITOT 1.0  --      Microbiology Results  Results for orders placed or performed during the hospital encounter of 03/12/19  SARS Coronavirus 2 [facility] order, Performed in [facility] hospital lab) Nasopharyngeal Nasopharyngeal Swab     Status: None   Collection Time: 03/12/19 10:12 AM   Specimen: Nasopharyngeal Swab  Result Value Ref Range Status   SARS Coronavirus 2 NEGATIVE NEGATIVE Final    Comment: (NOTE) If result is NEGATIVE SARS-CoV-2 target nucleic acids are NOT DETECTED. The SARS-CoV-2 RNA is generally detectable in upper and lower  respiratory specimens during the acute phase of infection. The lowest  concentration of SARS-CoV-2 viral copies this assay can detect is 250  copies / mL. A negative result does not preclude SARS-CoV-2 infection  and should not be used as the sole basis for treatment or other  patient management decisions.  A negative result may occur with  improper specimen collection / handling, submission of specimen other  than nasopharyngeal swab, presence of viral mutation(s) within the  areas targeted by this assay, and inadequate number of viral copies  (<250 copies / mL). A negative result must be combined with clinical  observations, patient history, and epidemiological information. If result is POSITIVE SARS-CoV-2 target nucleic acids are DETECTED. The SARS-CoV-2 RNA is generally  detectable in upper and lower  respiratory specimens dur ing the acute phase of infection.  Positive  results are indicative of active infection with SARS-CoV-2.  Clinical  correlation with patient history and other diagnostic information is  necessary to determine patient infection status.  Positive results do  not rule out bacterial infection or co-infection with other viruses. If result is PRESUMPTIVE POSTIVE SARS-CoV-2 nucleic acids MAY BE PRESENT.   A presumptive positive result was obtained on the submitted specimen  and confirmed on repeat testing.  While 2019 novel coronavirus  (SARS-CoV-2) nucleic acids may be present in the submitted sample  additional confirmatory testing may be necessary for epidemiological  and / or clinical management purposes  to differentiate between  SARS-CoV-2 and other Sarbecovirus currently known to infect humans.  If clinically indicated additional testing with an alternate test  methodology [phone]) is advised. The SARS-CoV-2 RNA is generally  detectable in upper and lower respiratory sp ecimens during the acute  phase of infection. The expected result is Negative. Fact Sheet for Patients:  StrictlyIdeas.no Fact Sheet for Healthcare Providers: BankingDealers.co.za This test is not yet approved or cleared by the [place] FDA and has been authorized for detection and/or diagnosis of SARS-CoV-2 by FDA under an Emergency Use Authorization (EUA).  This EUA will remain in effect (meaning this test can be used) for the duration of the COVID-19 declaration under Section 564(b)(1) of the Act, 21 U.S.C. section 360bbb-3(b)(1), unless the authorization is terminated or revoked sooner. Performed at [facility], [address] [place], [place] [address]     RADIOLOGY:  Mr Angio Neck Wo Contrast  Result Date: 03/13/2019 CLINICAL DATA:  Vertigo, persistent, central dizziness, TIA EXAM: MRA  NECK WITHOUT CONTRAST TECHNIQUE: Angiographic images of the neck were obtained using MRA technique without intravenous contrast. Carotid stenosis measurements (when applicable) are obtained utilizing NASCET criteria, using the distal internal carotid diameter as the denominator. COMPARISON:  Brain MRI 03/12/2019 FINDINGS: The origins of the common carotid and vertebral arteries are poorly assessed on this examination due to pulsation artifact and non-contrast technique. Beyond its origin, the right common carotid artery is patent. Irregular loss of flow related signal within the proximal/mid right common carotid artery may reflect artifact. A stenosis/filling defect at this site is difficult to exclude. Atherosclerotic irregularity at the right carotid bifurcation. Apparent narrowing of the proximal cervical right ICA with minimal diameter of 2.6 mm. When compared with the normal diameter of the more distal cervical ICA of 5.0, this corresponds with an estimated stenosis of approximately 50%. Distal to this, the cervical right ICA is patent without measurable stenosis. Beyond its origin, the left common carotid artery is patent with atherosclerotic irregularity and multifocal mild luminal narrowing. Atherosclerotic irregularity at the left carotid bifurcation. There is complete loss of flow related signal within the cervical left internal carotid artery shortly beyond its origin. No flow related signal is seen more distally within the cervical left ICA. Beyond the vessel origins, the bilateral cervical vertebral arteries are patent without measurable stenosis to the C1 level. The intracranial vertebral arteries are poorly assessed on the current examination. Impression #1 below will be called to the ordering clinician or representative by the Radiologist Assistant, and communication documented in the PACS or zVision Dashboard. IMPRESSION: - Complete loss of flow related signal within the cervical left ICA shortly  beyond its origin. Findings consistent with vessel occlusion or severe stenosis. - Apparent approximate 50% narrowing of the proximal cervical right ICA. - The origins of the bilateral common carotid and vertebral arteries are poorly assessed on this exam due to pulsation artifact and non-contrast technique. - Beyond their origins, the bilateral common carotid arteries are patent. Irregular loss of flow related signal within the proximal/mid right common carotid artery may reflect artifact. A stenosis/filling defect cannot be excluded. Consider carotid artery ultrasound for further evaluation. Atherosclerosis with regions of mild luminal narrowing in the left common carotid artery. - Beyond their origins, the vertebral arteries are patent within the neck without measurable stenosis to the C1 level. The intracranial vertebral arteries are poorly assessed on this examination. Electronically  Signed   By: Kellie Simmering   On: 03/13/2019 14:37   Mr Brain Wo Contrast  Result Date: 03/12/2019 CLINICAL DATA:  68 year old male with persistent vertigo.  Diarrhea. EXAM: MRI HEAD WITHOUT CONTRAST TECHNIQUE: Multiplanar, multiecho pulse sequences of the brain and surrounding structures were obtained without intravenous contrast. COMPARISON:  None. FINDINGS: Brain: No restricted diffusion to suggest acute infarction. No midline shift, mass effect, evidence of mass lesion, ventriculomegaly, extra-axial collection or acute intracranial hemorrhage. Cervicomedullary junction and pituitary are within normal limits. No cortical encephalomalacia identified. However, there may be mild superficial siderosis of the left superior frontal gyrus on series 8, image 22. No other chronic blood products identified though. Cerebral white matter signal is normal for age. There are small chronic lacunar infarcts of the bilateral caudate greater on the right (series 7, image 16). Negative other deep gray nuclei, brainstem and cerebellum. Vascular:  The left ICA siphon flow void is asymmetrically diminished (series 7, images 7 and 8) with evidence of reconstituted flow at the left ICA terminus. Other Major intracranial vascular flow voids are preserved. Skull and upper cervical spine: Negative visible cervical spine. Visualized bone marrow signal is within normal limits. Sinuses/Orbits: Negative orbits.  Paranasal sinuses are clear. Other: Grossly normal visible internal auditory structures. Mild right inferior mastoid fluid. Small volume retained secretions in the nasopharynx. Scalp and face soft tissues appear negative. IMPRESSION: 1. 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 Carotid Doppler or CTA should evaluate further. 2.  No acute intracranial abnormality. 3. Mild for age chronic small vessel disease in the caudate nuclei. 4. Possible mild superficial siderosis of the left superior frontal gyrus, such as due to a prior subarachnoid hemorrhage. #1 will be called to the ordering clinician or representative by the Radiologist Assistant, and communication documented in the PACS or zVision Dashboard. Electronically Signed   By: Genevie Ann M.D.   On: 03/12/2019 22:28   Mr Hip Left Wo Contrast  Result Date: 03/12/2019 CLINICAL DATA:  Chronic hip pain, tendonitis EXAM: MR OF THE LEFT HIP WITHOUT CONTRAST TECHNIQUE: Multiplanar, multisequence MR imaging was performed. No intravenous contrast was administered. COMPARISON:  CT March 12, 2019 FINDINGS: Bones: There is no evidence of acute fracture, dislocation or avascular necrosis. The visualized bony pelvis appears normal. The visualized sacroiliac joints and symphysis pubis appear normal. Articular cartilage and labrum Articular cartilage: There is mild chondral thinning seen at the superior femoroacetabular joint. Labrum: There is no gross labral tear or paralabral abnormality. Joint or bursal effusion Joint effusion: No significant hip joint  effusion. Bursae: No focal periarticular fluid collection. Muscles and tendons Muscles and tendons: There is mild gluteal insertional tendinosis. The remainder of the tendons are intact. The piriformis muscles appear symmetric. Other findings Miscellaneous: The visualized internal pelvic contents appear unremarkable. Degenerative changes are seen in the lower lumbar spine. IMPRESSION: Mild femoroacetabular joint osteoarthritis. Mild insertional gluteal tendinosis. Electronically Signed   By: Prudencio Pair M.D.   On: 03/12/2019 22:51     Management plans discussed with the patient, family and they are in agreement.  CODE STATUS:     Code Status Orders  (From admission, onward)         Start     Ordered   03/12/19 1725  Full code  Continuous     03/12/19 1724        Code Status History    This patient  has a current code status but no historical code status.   Advance Care Planning Activity    Advance Directive Documentation     Most Recent Value  Type of Advance Directive  Healthcare Power of Attorney, Living will  Pre-existing out of facility DNR order (yellow form or pink MOST form)  --  "MOST" Form in Place?  --      TOTAL TIME TAKING CARE OF THIS PATIENT: 38  minutes.    Gladstone Lighter M.D on 03/13/2019 at 3:07 PM  Between 7am to 6pm - Pager - [phone]  After 6pm go to [email or web address] - Technical brewer [facility]  Office  [phone]  CC: Primary care physician; Steele Sizer, MD   Note: This dictation was prepared with Dragon dictation along with smaller phrase technology. Any transcriptional errors that result from this process are unintentional.

## 2019-03-13 NOTE — Progress Notes (Addendum)
[facility] [place] NAME: Dylan Goodman    MR#:  [id number]  DATE OF BIRTH:  [birth year]  SUBJECTIVE:  CHIEF COMPLAINT:   Chief Complaint  Patient presents with   Nausea   Emesis   -Feels much better today.  Less dizzy when lying in bed.  Scared to get up and move around  REVIEW OF SYSTEMS:  Review of Systems  Constitutional: Negative for chills, fever and malaise/fatigue.  HENT: Negative for congestion, ear discharge, hearing loss and nosebleeds.   Eyes: Negative for blurred vision and double vision.  Respiratory: Negative for cough, shortness of breath and wheezing.   Cardiovascular: Negative for chest pain, palpitations and leg swelling.  Gastrointestinal: Negative for abdominal pain, constipation, diarrhea, nausea and vomiting.  Genitourinary: Negative for dysuria.  Musculoskeletal: Negative for myalgias.  Neurological: Positive for dizziness. Negative for speech change, focal weakness, seizures, weakness and headaches.  Psychiatric/Behavioral: Negative for depression.    DRUG ALLERGIES:  No Known Allergies  VITALS:  Blood pressure 109/62, pulse 85, temperature 97.7 F (36.5 C), temperature source Oral, resp. rate 18, height 6' (1.829 m), weight 99.7 kg, SpO2 94 %.  PHYSICAL EXAMINATION:  Physical Exam   GENERAL:  68 y.o.-year-old patient lying in the bed with no acute distress.  EYES: Pupils equal, round, reactive to light and accommodation. No scleral icterus. Extraocular muscles intact.  HEENT: Head atraumatic, normocephalic. Oropharynx and nasopharynx clear.  NECK:  Supple, no jugular venous distention. No thyroid enlargement, no tenderness.  LUNGS: Normal breath sounds bilaterally, no wheezing, rales,rhonchi or crepitation. No use of accessory muscles of respiration.  CARDIOVASCULAR: S1, S2 normal. No  rubs, or gallops.  2/6 systolic murmur is present ABDOMEN: Soft, nontender, nondistended. Bowel sounds present. No  organomegaly or mass.  EXTREMITIES: No pedal edema, cyanosis, or clubbing.  NEUROLOGIC: Cranial nerves II through XII are intact.  No nystagmus on exam. muscle strength 5/5 in all extremities. Sensation intact. Gait not checked.  PSYCHIATRIC: The patient is alert and oriented x 3.  SKIN: No obvious rash, lesion, or ulcer.    LABORATORY PANEL:   CBC Recent Labs  Lab 03/13/19 0848  WBC 11.2*  HGB 12.0*  HCT 36.7*  PLT 211   ------------------------------------------------------------------------------------------------------------------  Chemistries  Recent Labs  Lab 03/12/19 0714 03/13/19 0848  NA 138 140  K 3.4* 3.9  CL 108 110  CO2 15* 19*  GLUCOSE 140* 139*  BUN 48* 58*  CREATININE 2.87* 2.95*  CALCIUM 6.8* 6.4*  AST 25  --   ALT 20  --   ALKPHOS 62  --   BILITOT 1.0  --    ------------------------------------------------------------------------------------------------------------------  Cardiac Enzymes No results for input(s): TROPONINI in the last 168 hours. ------------------------------------------------------------------------------------------------------------------  RADIOLOGY:  Ct Abdomen Pelvis Wo Contrast  Result Date: 03/12/2019 CLINICAL DATA:  Nausea vomiting post EGD. EXAM: CT ABDOMEN AND PELVIS WITHOUT CONTRAST TECHNIQUE: Multidetector CT imaging of the abdomen and pelvis was performed following the standard protocol without IV contrast. COMPARISON:  None. FINDINGS: Lower chest: Calcific atherosclerotic disease of the coronary arteries and aorta. Hepatobiliary: 1.8 cm benign-appearing cyst in the dome of the liver. Pancreas: Unremarkable. No pancreatic ductal dilatation or surrounding inflammatory changes. Spleen: Normal in size without focal abnormality. Adrenals/Urinary Tract: Adrenal glands are unremarkable. Kidneys are normal, without renal calculi, focal lesion, or hydronephrosis. Bladder is unremarkable. Stomach/Bowel: Diffuse thickening of the  wall of the gastric body and cardia, with multiple gas bubbles associated with the thickened  wall. No extraluminal gas. The stomach is not opacified and therefore the internal architecture of the gastric wall could not be evaluated. No evidence of small-bowel obstruction. Diffuse colonic diverticulosis without evidence of diverticulitis. Vascular/Lymphatic: Advanced atherosclerotic disease with tortuosity of the aorta. Fusiform infrarenal abdominal aortic aneurysm which measures 4 cm. Additional saccular aneurysm of the distal abdominal aorta which measures 3.8 cm, image 49/104, sequence 2. No enlarged abdominal or pelvic lymph nodes. Reproductive: Enlargement of the prostate gland. Other: Coil artifact associated with the left iliac vessels. Musculoskeletal: Spondylosis of the spine. IMPRESSION: 1. Diffuse thickening of the wall of the gastric body and cardia, with multiple gas bubbles associated with the thickened wall. The stomach is not opacified and therefore the internal architecture of the gastric wall could not be evaluated. No extraluminal gas. 2. Advanced atherosclerotic disease of the aorta and coronary arteries. 3. Fusiform infrarenal abdominal aortic aneurysm which measures 4 cm. Additional saccular aneurysm of the distal abdominal aorta which measures 3.8 cm. Evaluation with CTA or MRA of the abdomen may be considered, if found clinically appropriate. 4. Enlargement of the prostate gland. Please correlate to serum PSA values. Electronically Signed   By: Fidela Salisbury M.D.   On: 03/12/2019 09:23   Dg Chest 2 View  Result Date: 03/12/2019 CLINICAL DATA:  Nausea and vomiting post upper endoscopy. Possible aspiration. EXAM: CHEST - 2 VIEW COMPARISON:  None. FINDINGS: Mild cardiomegaly. Aortic atherosclerosis. Mild hyperinflation in diffuse pulmonary interstitial prominence, consistent with COPD. No evidence of pulmonary airspace disease or pleural effusion. Prior CABG noted. IMPRESSION: Mild  cardiomegaly and COPD. No active lung disease. Electronically Signed   By: Marlaine Hind M.D.   On: 03/12/2019 08:09   Mr Brain Wo Contrast  Result Date: 03/12/2019 CLINICAL DATA:  68 year old male with persistent vertigo.  Diarrhea. EXAM: MRI HEAD WITHOUT CONTRAST TECHNIQUE: Multiplanar, multiecho pulse sequences of the brain and surrounding structures were obtained without intravenous contrast. COMPARISON:  None. FINDINGS: Brain: No restricted diffusion to suggest acute infarction. No midline shift, mass effect, evidence of mass lesion, ventriculomegaly, extra-axial collection or acute intracranial hemorrhage. Cervicomedullary junction and pituitary are within normal limits. No cortical encephalomalacia identified. However, there may be mild superficial siderosis of the left superior frontal gyrus on series 8, image 22. No other chronic blood products identified though. Cerebral white matter signal is normal for age. There are small chronic lacunar infarcts of the bilateral caudate greater on the right (series 7, image 16). Negative other deep gray nuclei, brainstem and cerebellum. Vascular: The left ICA siphon flow void is asymmetrically diminished (series 7, images 7 and 8) with evidence of reconstituted flow at the left ICA terminus. Other Major intracranial vascular flow voids are preserved. Skull and upper cervical spine: Negative visible cervical spine. Visualized bone marrow signal is within normal limits. Sinuses/Orbits: Negative orbits.  Paranasal sinuses are clear. Other: Grossly normal visible internal auditory structures. Mild right inferior mastoid fluid. Small volume retained secretions in the nasopharynx. Scalp and face soft tissues appear negative. IMPRESSION: 1. 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 Carotid Doppler or CTA should evaluate further. 2.  No acute intracranial abnormality. 3. Mild for age chronic small vessel  disease in the caudate nuclei. 4. Possible mild superficial siderosis of the left superior frontal gyrus, such as due to a prior subarachnoid hemorrhage. #1 will be called to the ordering clinician or representative by the Radiologist Assistant, and  communication documented in the PACS or zVision Dashboard. Electronically Signed   By: Genevie Ann M.D.   On: 03/12/2019 22:28   Mr Hip Left Wo Contrast  Result Date: 03/12/2019 CLINICAL DATA:  Chronic hip pain, tendonitis EXAM: MR OF THE LEFT HIP WITHOUT CONTRAST TECHNIQUE: Multiplanar, multisequence MR imaging was performed. No intravenous contrast was administered. COMPARISON:  CT March 12, 2019 FINDINGS: Bones: There is no evidence of acute fracture, dislocation or avascular necrosis. The visualized bony pelvis appears normal. The visualized sacroiliac joints and symphysis pubis appear normal. Articular cartilage and labrum Articular cartilage: There is mild chondral thinning seen at the superior femoroacetabular joint. Labrum: There is no gross labral tear or paralabral abnormality. Joint or bursal effusion Joint effusion: No significant hip joint effusion. Bursae: No focal periarticular fluid collection. Muscles and tendons Muscles and tendons: There is mild gluteal insertional tendinosis. The remainder of the tendons are intact. The piriformis muscles appear symmetric. Other findings Miscellaneous: The visualized internal pelvic contents appear unremarkable. Degenerative changes are seen in the lower lumbar spine. IMPRESSION: Mild femoroacetabular joint osteoarthritis. Mild insertional gluteal tendinosis. Electronically Signed   By: Prudencio Pair M.D.   On: 03/12/2019 22:51    EKG:   Orders placed or performed during the hospital encounter of 03/12/19   EKG 12-Lead   EKG 12-Lead   EKG 12-Lead   EKG 12-Lead    ASSESSMENT AND PLAN:   Malaki Koury  is a 68 y.o. male with a known history of CAD status post CABG, atrial fibrillation on Eliquis, history  of nasopharyngeal carcinoma, diabetes mellitus type, iron deficiency anemia, CKD stage IV presents to hospital secondary to dizziness, weakness and nausea today.  1.  Dizziness-feels much better today.  Not sure if he got dehydrated after the colon prep and several episodes of diarrhea. -Patient does have chronic diarrhea.  Could not tolerate orthostatics to be checked yesterday. 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  Has chronic small vessel ischemic disease changes.   -Also recently started on Ranexa which has been held. -Requested vascular consult for his left ICA stenosis.  We will get MRA of his neck as he cannot get CT angio of his neck  given his kidney disease. -On Brilinta and statin  2.  CAD status post CABG-continue Brilinta, statin, Coreg, losartan and Imdur low normal,   -Hold Ranexa as dizziness symptoms started recently and that is a new medication for him as well  3.  Hypertension-on Coreg, losartan, Imdur and Norvasc.  If blood pressure is low, will hold medication  4.  Diabetes-we will start sliding scale insulin  5.  PAD-on Pletal and statin  6.  A. fib-heart rate elevated yesterday.  Continue to monitor closely.  On Coreg and Eliquis  7.  Acute urinary retention-required in and out catheterization once yesterday.  Voided this morning.  Will start Flomax  8.  Hypocalcemia-being replaced.  Also will check vitamin D level and phosphorus level  Independent at baseline PT consult today     All the records are reviewed and case discussed with Care Management/Social Workerr. Management plans discussed with the patient, family and they are in agreement.  CODE STATUS: Full Code  TOTAL TIME TAKING CARE OF THIS PATIENT: 38 minutes.   POSSIBLE D/C IN 1-2 DAYS, DEPENDING ON CLINICAL CONDITION.   Gladstone Lighter M.D on  03/13/2019 at 12:05 PM  Between 7am to 6pm - Pager - [phone]  After 6pm go to [email or web address] - password [email or web address] [facility]  Office  [phone]  CC: Primary care physician; Steele Sizer, MD

## 2019-03-13 NOTE — Care Management CC44 (Signed)
Condition Code 44 Documentation Completed  Patient Details  Name: Suliman Termini MRN: [id number] Date of Birth: March 22, 1951   Condition Code 44 given:  Yes Patient signature on Condition Code 44 notice:  Yes Documentation of 2 MD's agreement:  Yes Code 44 added to claim:  Yes    Delrae Hagey, Veronia Beets, LCSW 03/13/2019, 3:37 PM

## 2019-03-14 LAB — HIV ANTIBODY (ROUTINE TESTING W REFLEX): HIV Screen 4th Generation wRfx: NONREACTIVE

## 2019-03-14 LAB — VITAMIN D 25 HYDROXY (VIT D DEFICIENCY, FRACTURES): Vit D, 25-Hydroxy: 20.1 ng/mL — ABNORMAL LOW (ref 30.0–100.0)

## 2019-03-17 ENCOUNTER — Ambulatory Visit (INDEPENDENT_AMBULATORY_CARE_PROVIDER_SITE_OTHER): Payer: Medicare Other | Admitting: Internal Medicine

## 2019-03-17 ENCOUNTER — Encounter: Payer: Self-pay | Admitting: Internal Medicine

## 2019-03-17 ENCOUNTER — Other Ambulatory Visit: Payer: Self-pay

## 2019-03-17 VITALS — BP 102/60 | HR 78 | Temp 97.1°F | Ht 72.0 in | Wt 214.0 lb

## 2019-03-17 DIAGNOSIS — J849 Interstitial pulmonary disease, unspecified: Secondary | ICD-10-CM | POA: Diagnosis not present

## 2019-03-17 DIAGNOSIS — R0609 Other forms of dyspnea: Secondary | ICD-10-CM

## 2019-03-17 DIAGNOSIS — I6523 Occlusion and stenosis of bilateral carotid arteries: Secondary | ICD-10-CM

## 2019-03-17 MED ORDER — BUDESONIDE-FORMOTEROL FUMARATE 160-4.5 MCG/ACT IN AERO: 2.0000 | INHALATION_SPRAY | Freq: Two times a day (BID) | RESPIRATORY_TRACT | 12 refills | Status: DC

## 2019-03-17 MED ORDER — BUDESONIDE-FORMOTEROL FUMARATE 160-4.5 MCG/ACT IN AERO: 2.0000 | INHALATION_SPRAY | Freq: Two times a day (BID) | RESPIRATORY_TRACT | 0 refills | Status: DC

## 2019-03-17 NOTE — Patient Instructions (Signed)
Your trouble breathing could be from multiple causes, including heart disease, lung disease, vascular disease.  Start Symbicort 2 puffs twice daily, rinse mouth after use. We will send you for a VQ scan to look for evidence of blood clots.  We will do this test urgently.  We will send for a CT high-resolution to look for any evidence of scarring in her lungs. -Not urgent.  We will check an echocardiogram to look for evidence of elevated vascular pressure (pulmonary hypertension).-Not urgent.

## 2019-03-17 NOTE — Progress Notes (Signed)
[facility] Consultation      Assessment and Plan:  Dyspnea on exertion, subacute onset, of uncertain etiology. - Differential includes a progressive coronary artery disease, pulmonary hypertension, pulmonary vascular disease (pulmonary embolism). - Patient recently had a COVID test while inpatient which was negative. - We will send for an urgent VQ scan, patient has chronic kidney disease. - Will obtain an echocardiogram to rule out pulmonary hypertension.   Emphysema. - Diffuse bilateral emphysema, which is worse in the apices seen on recent low-dose lung CT. - We will start empirically on Symbicort inhaler. -Patient is already enrolled in lung cancer screening.  Interstitial lung disease. - Patient with evidence of mild interstitial lung disease seen on recent CT low-dose. - We will obtain CT high-resolution to further delineate this.  Orders Placed This Encounter  Procedures  . NM Pulmonary Perf and Vent  . CT CHEST HIGH RESOLUTION  . DG Chest 2 View  . ECHOCARDIOGRAM COMPLETE   Meds ordered this encounter  Medications  . budesonide-formoterol (SYMBICORT) 160-4.5 MCG/ACT inhaler    Sig: Inhale 2 puffs into the lungs 2 (two) times daily. Rinse mouth after use    Dispense:  1 Inhaler    Refill:  12  . budesonide-formoterol (SYMBICORT) 160-4.5 MCG/ACT inhaler    Sig: Inhale 2 puffs into the lungs 2 (two) times daily for 1 day.    Dispense:  1 Inhaler    Refill:  0    Order Specific Question:   Lot Number?    Answer:   [id number]    Order Specific Question:   Expiration Date?    Answer:   11/28/2019    Order Specific Question:   Manufacturer?    Answer:   [employer] [[id number]]    Order Specific Question:   Quantity    Answer:   1   Return in about 5 weeks (around 04/21/2019).    Date: 03/17/2019  MRN# [id number] Trig Mcbryar 1950-10-28  Referring Physician: Dr. Starr [place] Toso is a 68 y.o. old male seen in consultation for chief  complaint of:    Chief Complaint  Patient presents with  . pulmonary consult    per Dr. Ancil Boozer- pt reports of sob with exertion x2-3 weeks and dizziness x5d    HPI:  Bj Morlock is a 68 y.o. old male he has been having symptoms of persistent diarrhea, for which he underwent an EGD on 03/12/2019.  He was discharged but presented back to the ED with persistent diarrhea, dizziness, dehydration.  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  He known history of CAD status post CABG, atrial fibrillation on Eliquis, history of nasopharyngeal carcinoma, diabetes mellitus type, iron deficiency anemia, CKD stage IV, chronic systolic CHF.   He is here today with dyspnea with exertion but not at rest. This has been present for about 2 weeks.  He has been getting muscle cramps in arms and legs.  He was a former smoker of 2 ppd, quit in 2018.  He takes no inhalers, he has never been diagnosed with COPD in the past.   He has not really been very active, does not exercise. He is normally limited in activity by left hip pain but not by breathing.  He has 1 dog at home, denies significant nasal drip.  Does have history of GERD, which is controlled with medications.  **Low dose CT chest 01/27/2019>> There  is diffuse emphysema, worse in the apices. There is basilar mild fibrotic changes.  **Chest x-ray 03/12/2019>> imaging personally reviewed.  Hyperinflation can suggestive of emphysema, bilateral interstitial changes diffusely throughout both lungs which appear to be more prominent in the bases bilaterally.  Review of lung cuts of CT AP moderate to basilar subpleural interstitial changes, worse on the right base than the left.  Per report patient has evidence of diffuse arterial disease.  PMHX:   Past Medical History:  Diagnosis Date  . Bleeding gastrointestinal 09/03/2018  . Cancer of nasopharyngeal  (posterior) (superior) surface of soft palate (HCC) 05/2008  . Chronic depression 09/03/2018  . CKD (chronic kidney disease) 09/03/2018  . Coronary atherosclerosis 09/03/2018  . Coronary bypass graft mechanical complication 37/9024  . Diabetes mellitus without complication ([place])   . Gastric ulcer with hemorrhage 09/03/2018  . Gastritis 09/03/2018  . GERD (gastroesophageal reflux disease) 09/03/2018  . Heart disease   . Heart murmur   . IDA (iron deficiency anemia) 09/03/2018  . Low magnesium levels 09/03/2018  . 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 Myocardial infarction ([place]) 2017  . Personal history of radiation therapy 05/30/2018   39 treatments with chemotherapy nasopharyngeal cancer  . 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 CAROTID ENDARTERECTOMY  2006  . COLONOSCOPY WITH PROPOFOL N/A 03/11/2019   Procedure: COLONOSCOPY WITH PROPOFOL;  Surgeon: Toledo, Benay Pike, MD;  Location: [facility] ENDOSCOPY;  Service: Gastroenterology;  Laterality: N/A;  . CORONARY ARTERY BYPASS GRAFT  1996   quadruple bypass  . ESOPHAGOGASTRODUODENOSCOPY (EGD) WITH PROPOFOL N/A 03/11/2019   Procedure: ESOPHAGOGASTRODUODENOSCOPY (EGD) WITH PROPOFOL;  Surgeon: Toledo, Benay Pike, MD;  Location: [facility] ENDOSCOPY;  Service: Gastroenterology;  Laterality: N/A;  . heart stent  10/2010   x3  . 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 left leg stents  08/2009  . LOWER EXTREMITY ANGIOGRAPHY Left 11/11/2018   Procedure: LOWER EXTREMITY ANGIOGRAPHY;  Surgeon: Katha Cabal, MD;  Location: [facility];  Service: Cardiovascular;  Laterality: Left;  . REVISION OF AORTA BIFEMORAL BYPASS    . right femo pop bypass  06/1998   11/2001, 08/2003  . right leg stent  07/2009   Family Hx:  Family History  Problem Relation Age of Onset  . Coronary artery disease Mother   . Alcohol abuse Father   . Colon cancer Father   . Coronary artery  disease Father   . Kidney cancer Father   . Breast cancer Sister    Social Hx:   Social History   Tobacco Use  . Smoking status: Former Smoker    Packs/day: 1.50    Years: 50.00    Pack years: 75.00    Types: Cigarettes    Quit date: 02/18/2017    Years since quitting: 2.0  . Smokeless tobacco: Never Used  Substance Use Topics  . Alcohol use: Not Currently  . Drug use: Never   Medication:    Current Outpatient Medications:  .  amLODipine (NORVASC) 5 MG tablet, Take 5 mg by mouth daily., Disp: , Rfl:  .  carvedilol (COREG) 6.25 MG tablet, Take 6.25 mg by mouth 2 (two) times daily. , Disp: , Rfl:  .  cilostazol (PLETAL) 100 MG tablet, Take 100 mg by mouth 2 (two) times daily. , Disp: , Rfl:  .  ELIQUIS 2.5 MG TABS tablet, Take 2.5 mg by mouth 2 (  two) times daily. , Disp: , Rfl:  .  ezetimibe (ZETIA) 10 MG tablet, Take 10 mg by mouth daily. , Disp: , Rfl:  .  fenofibrate 160 MG tablet, Take 160 mg by mouth daily. , Disp: , Rfl:  .  Insulin Degludec (TRESIBA FLEXTOUCH) 200 UNIT/ML SOPN, Inject 12 Units into the skin at bedtime. , Disp: , Rfl:  .  isosorbide mononitrate (IMDUR) 120 MG 24 hr tablet, Take 120 mg by mouth daily., Disp: , Rfl:  .  lansoprazole (PREVACID) 30 MG capsule, Take 30 mg by mouth every morning. , Disp: , Rfl:  .  losartan (COZAAR) 25 MG tablet, Take 25 mg by mouth daily. , Disp: , Rfl:  .  lovastatin (MEVACOR) 40 MG tablet, Take 40 mg by mouth 2 (two) times daily. , Disp: , Rfl:  .  nitroGLYCERIN (NITROSTAT) 0.4 MG SL tablet, Place 0.4 mg under the tongue every 5 (five) minutes as needed for chest pain. , Disp: , Rfl:  .  sodium bicarbonate 650 MG tablet, Take 1 tablet by mouth daily. , Disp: , Rfl:  .  tamsulosin (FLOMAX) 0.4 MG CAPS capsule, Take 1 capsule (0.4 mg total) by mouth daily., Disp: 30 capsule, Rfl: 1 .  ticagrelor (BRILINTA) 60 MG TABS tablet, Take 60 mg by mouth 2 (two) times daily. , Disp: , Rfl:    Allergies:  Patient has no known allergies.   Review of Systems: Gen:  Denies  fever, sweats, chills HEENT: Denies blurred vision, double vision. bleeds, sore throat Cvc:  No dizziness, chest pain. Resp:   Denies cough or sputum production, shortness of breath Gi: Denies swallowing difficulty, stomach pain. Gu:  Denies bladder incontinence, burning urine Ext:   No Joint pain, stiffness. Skin: No skin rash,  hives  Endoc:  No polyuria, polydipsia. Psych: No depression, insomnia. Other:  All other systems were reviewed with the patient and were negative other that what is mentioned in the HPI.   Physical Examination:   VS: BP 102/60 (BP Location: Left Arm, Cuff Size: Normal)   Pulse 78   Temp (!) 97.1 F (36.2 C) (Temporal)   Ht 6' (1.829 m)   Wt 214 lb (97.1 kg)   SpO2 92%   BMI 29.02 kg/m   General Appearance: No distress  Neuro:without focal findings,  speech normal,  HEENT: PERRLA, EOM intact.   Pulmonary: normal breath sounds, No wheezing. Decreased air entry in both bases.  CardiovascularNormal S1,S2.  No m/r/g.   Abdomen: Benign, Soft, non-tender. Renal:  No costovertebral tenderness  GU:  No performed at this time. Endoc: No evident thyromegaly, no signs of acromegaly. Skin:   warm, no rashes, no ecchymosis  Extremities: normal, no cyanosis, clubbing.  Other findings:    LABORATORY PANEL:   CBC Recent Labs  Lab 03/13/19 0848  WBC 11.2*  HGB 12.0*  HCT 36.7*  PLT 211   ------------------------------------------------------------------------------------------------------------------  Chemistries  Recent Labs  Lab 03/12/19 0714 03/13/19 0848 03/13/19 1528  NA 138 140  --   K 3.4* 3.9  --   CL 108 110  --   CO2 15* 19*  --   GLUCOSE 140* 139*  --   BUN 48* 58*  --   CREATININE 2.87* 2.95*  --   CALCIUM 6.8* 6.4* 6.7*  AST 25  --   --   ALT 20  --   --   ALKPHOS 62  --   --   BILITOT 1.0  --   --     ------------------------------------------------------------------------------------------------------------------  Cardiac Enzymes No results for input(s): TROPONINI in the last 168 hours. ------------------------------------------------------------  RADIOLOGY:  No results found.     Thank  you for the consultation and for allowing [facility] to assist in the care of your patient. Our recommendations are noted above.  Please contact us if we can be of further service.   Marda Stalker, M.D., F.C.C.P.  Board Certified in Internal Medicine, Pulmonary Medicine, [place], and Sleep Medicine.  [facility] Office Number: [phone]   03/17/2019

## 2019-03-18 ENCOUNTER — Telehealth: Payer: Self-pay | Admitting: Internal Medicine

## 2019-03-18 ENCOUNTER — Encounter
Admission: RE | Admit: 2019-03-18 | Discharge: 2019-03-18 | Disposition: A | Discharge status: Home / Self Care | Payer: Medicare Other | Source: Ambulatory Visit | Attending: Internal Medicine | Admitting: Internal Medicine

## 2019-03-18 ENCOUNTER — Other Ambulatory Visit: Payer: Self-pay

## 2019-03-18 ENCOUNTER — Ambulatory Visit
Admission: RE | Admit: 2019-03-18 | Discharge: 2019-03-18 | Disposition: A | Discharge status: Home / Self Care | Payer: Medicare Other | Source: Ambulatory Visit | Attending: Internal Medicine | Admitting: Internal Medicine

## 2019-03-18 DIAGNOSIS — R0609 Other forms of dyspnea: Secondary | ICD-10-CM | POA: Diagnosis present

## 2019-03-18 IMAGING — CR CHEST - 2 VIEW
2 series · 2 of 2 positions shown · non-contrast
Comparison: [DATE]

CLINICAL DATA: Shortness of breath and dizziness for 4 days.

EXAM:
CHEST - 2 VIEW

[chest pa]
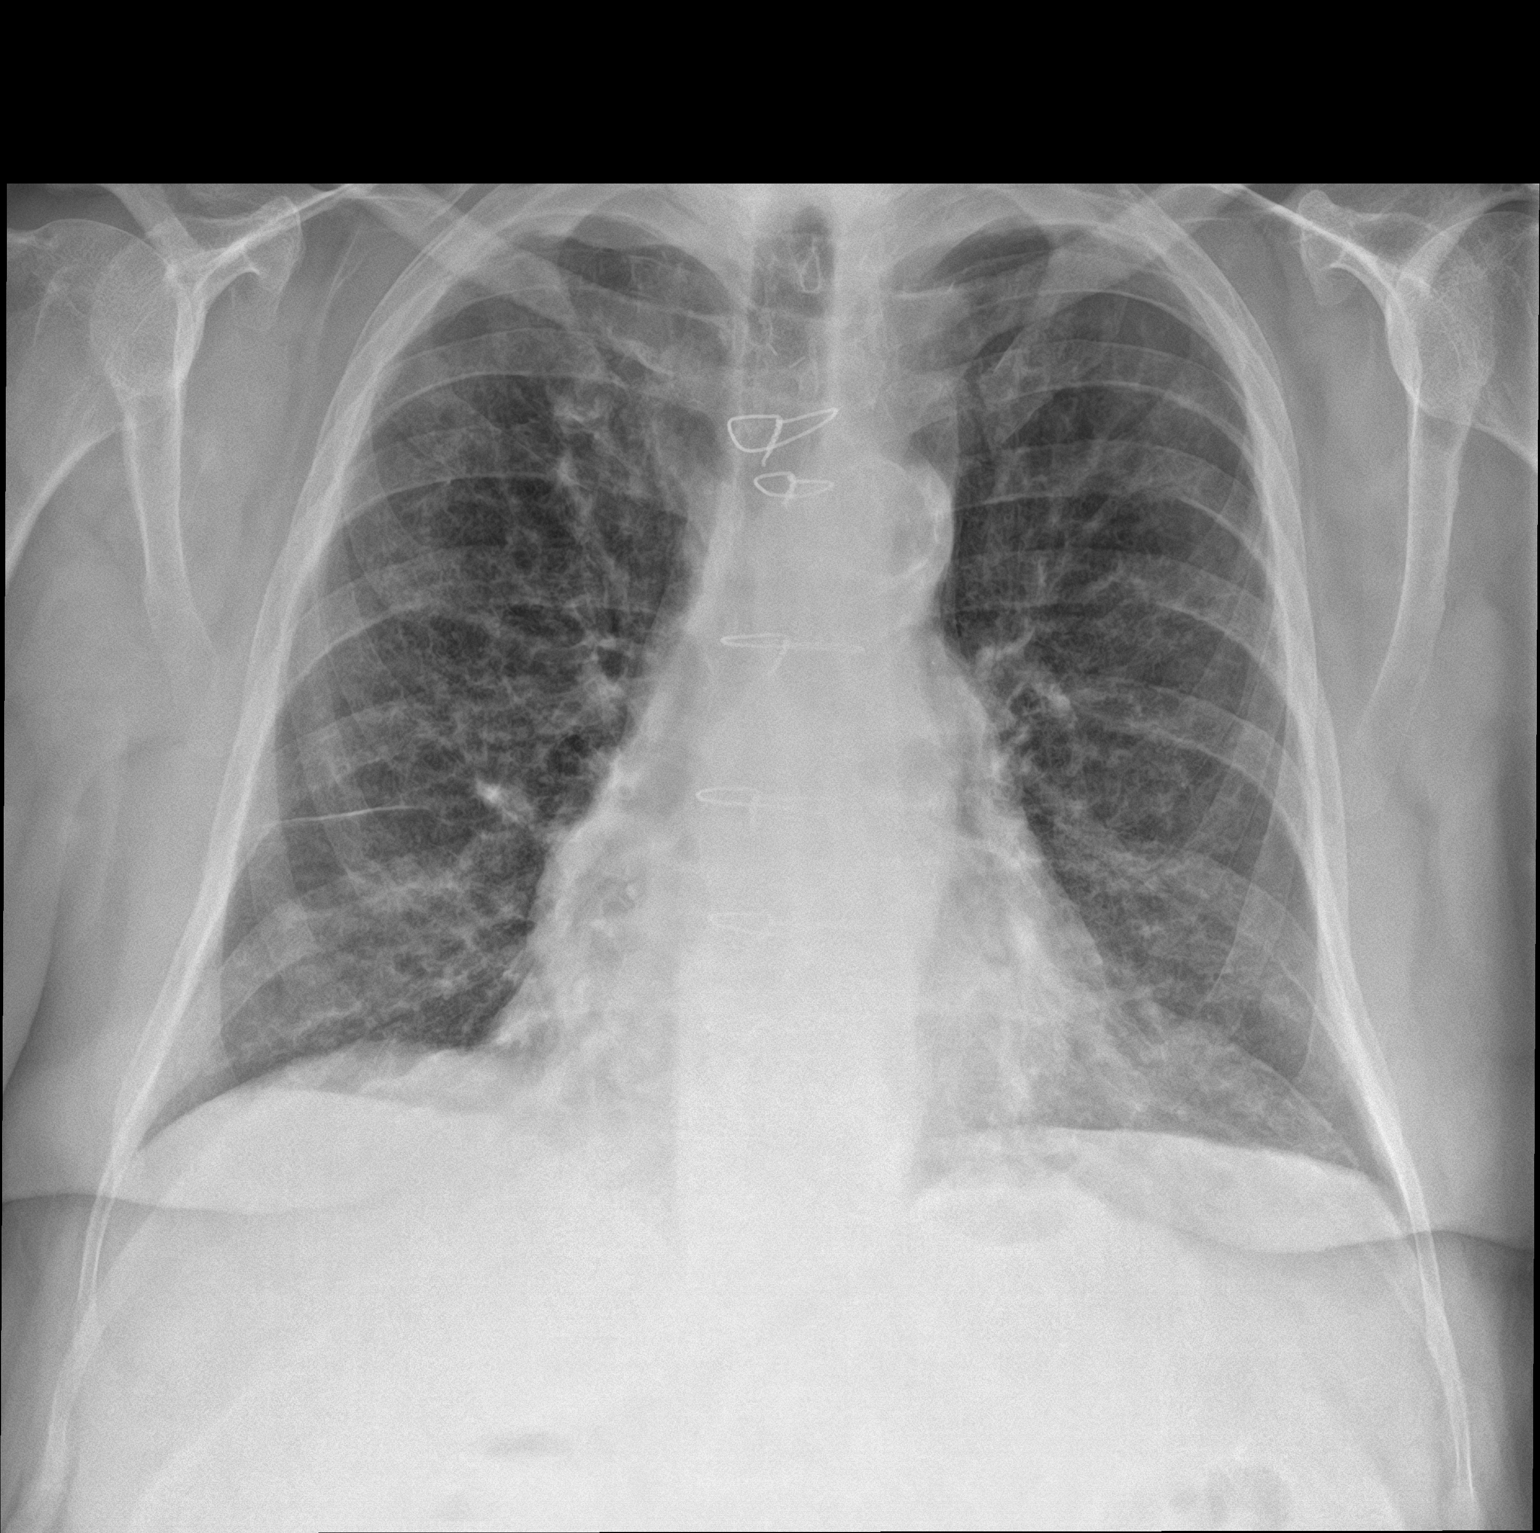

[chest lat]
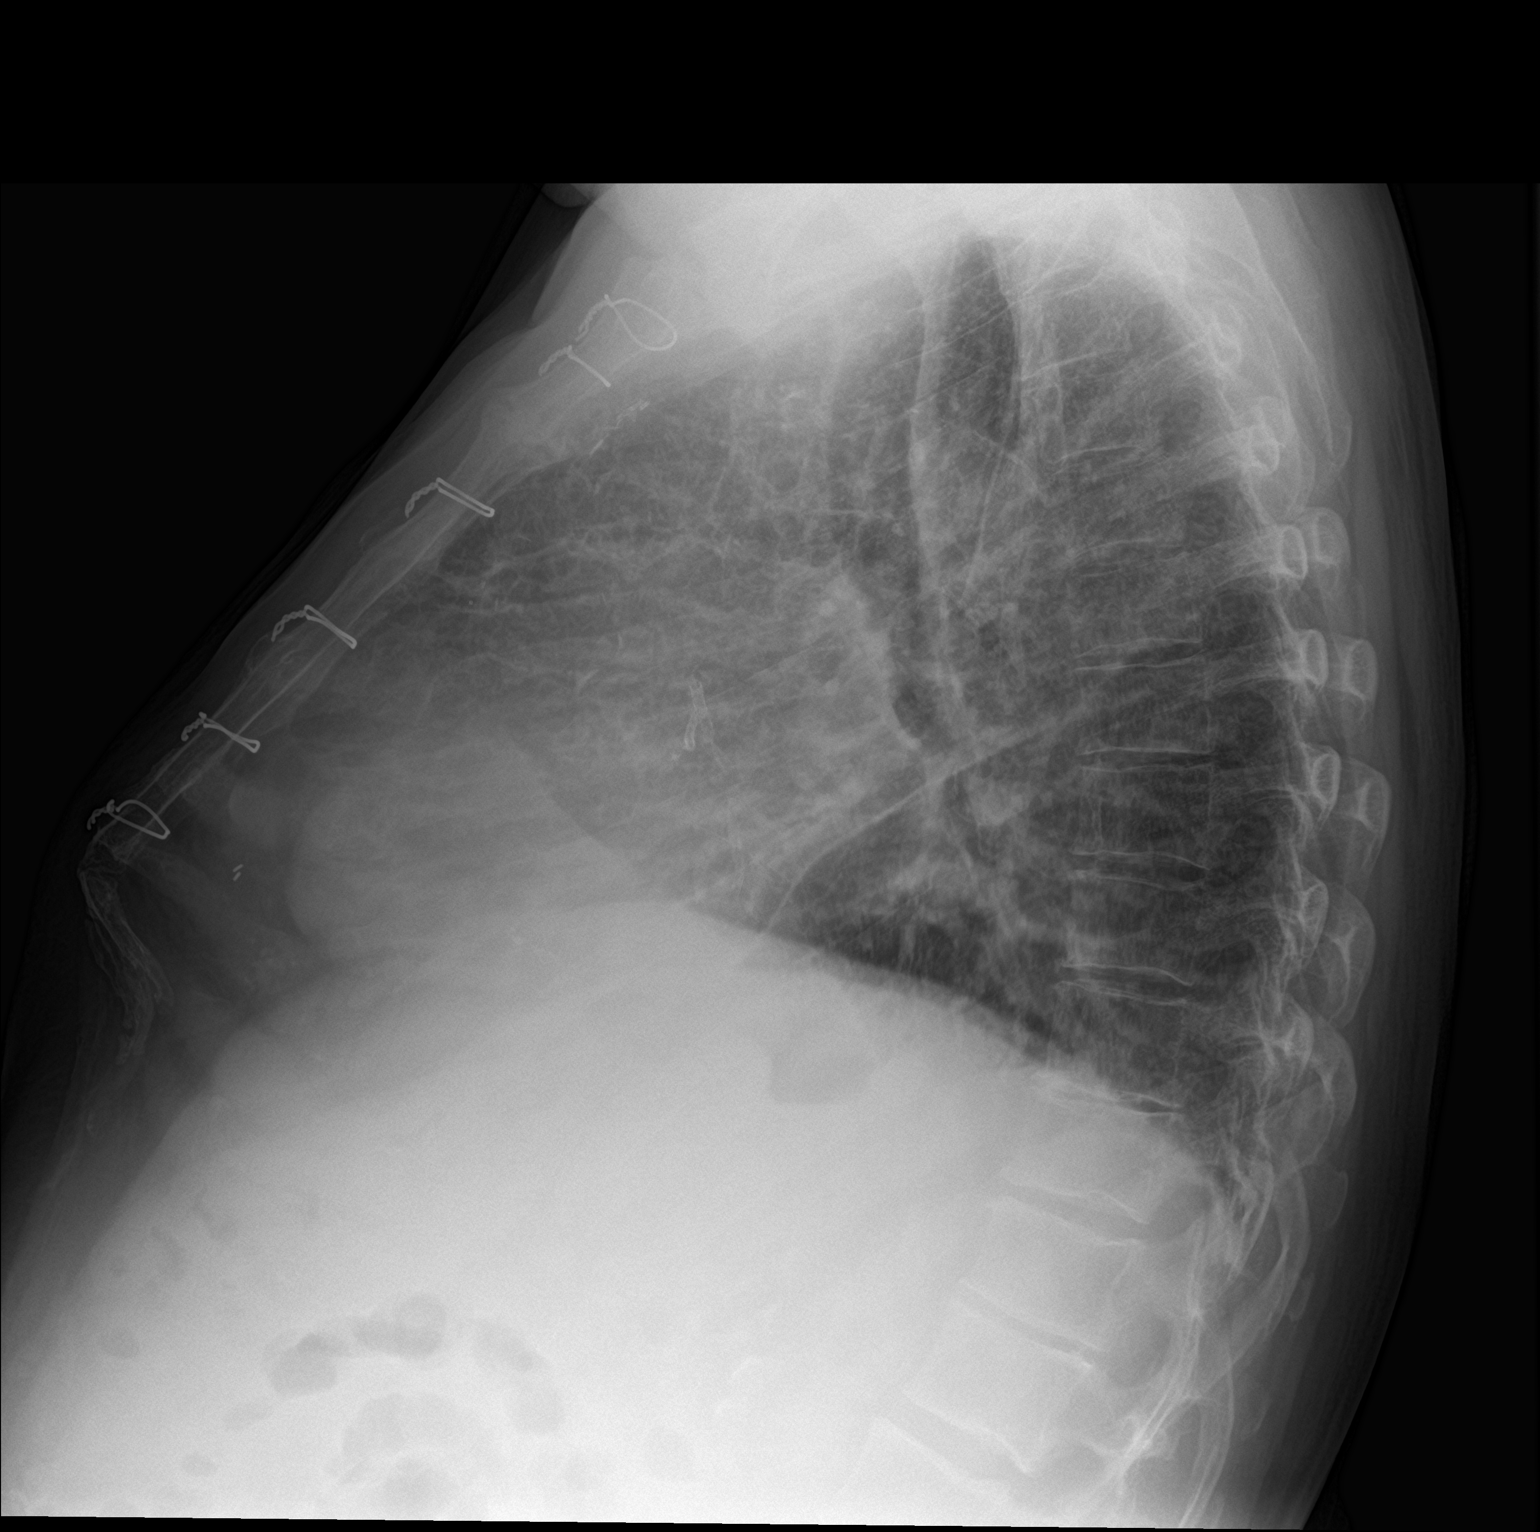

[2 of 2 positions shown; findings below may reference images not displayed]

FINDINGS: The heart size and mediastinal contours are within normal limits.
Aortic atherosclerosis. Pulmonary interstitial demonstrated. No
evidence of superimposed pulmonary infiltrate or edema. No evidence
of pleural effusion.
IMPRESSION: Stable pulmonary interstitial prominence. No active lung disease.

## 2019-03-18 IMAGING — NM NM PULMONARY VENTILATION AND PERFUSION SCAN
2 series · 16 of 16 positions shown · non-contrast
Comparison: None.

CLINICAL DATA: Dyspnea, PE suspected

EXAM:
NUCLEAR MEDICINE VENTILATION - PERFUSION LUNG SCAN
TECHNIQUE: Ventilation images were obtained in multiple projections using
inhaled aerosol [H4] DTPA. Perfusion images were obtained in
multiple projections after intravenous injection of [H4] MAA.
RADIOPHARMACEUTICALS:  31.8 mCi of [H4] DTPA aerosol inhalation
and 4.5 mCi [H4] MAA IV

[Series 1000: lung perfusion · 1.95mm/px · 4 acquisitions, 8 frames shown]
[im 1/4]
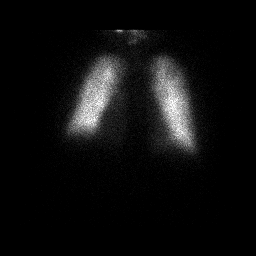
[im 1/4]
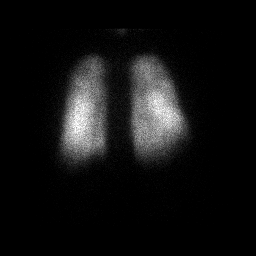
[im 2/4]
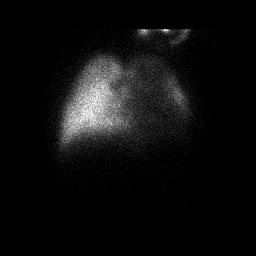
[im 2/4]
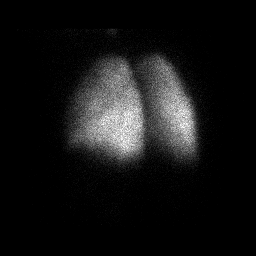
[im 3/4]
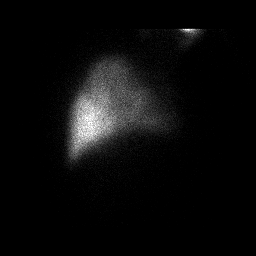
[im 3/4]
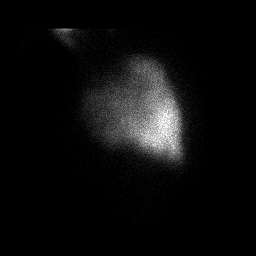
[im 4/4]
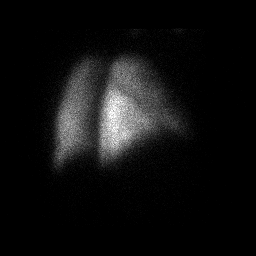
[im 4/4]
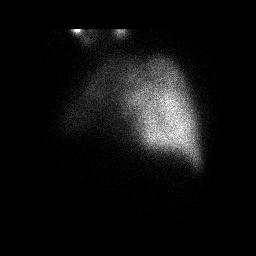

[Series 1000: lung ventilation · 3.90mm/px · 4 acquisitions, 8 frames shown]
[im 1/4]
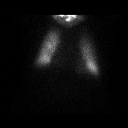
[im 1/4]
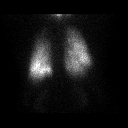
[im 2/4]
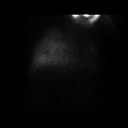
[im 2/4]
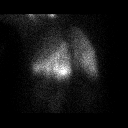
[im 3/4]
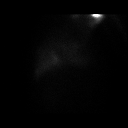
[im 3/4]
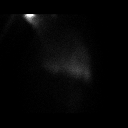
[im 4/4]
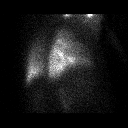
[im 4/4]
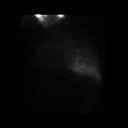

[16 of 16 positions shown; findings below may reference images not displayed]

FINDINGS: Ventilation: No focal ventilation defect.

Perfusion: No wedge shaped peripheral perfusion defects to suggest
acute pulmonary embolism.
IMPRESSION: Very low probability examination for pulmonary embolism by modified
PIOPED criteria (PE absent).

## 2019-03-18 MED ORDER — TECHNETIUM TC 99M DIETHYLENETRIAME-PENTAACETIC ACID
30.0000 | Freq: Once | INTRAVENOUS | Status: AC | PRN
  Administered 2019-03-18: 08:00:00 31.755 via INTRAVENOUS

## 2019-03-18 MED ORDER — TECHNETIUM TO 99M ALBUMIN AGGREGATED
4.0000 | Freq: Once | INTRAVENOUS | Status: AC | PRN
  Administered 2019-03-18: 4.542 via INTRAVENOUS

## 2019-03-18 NOTE — Telephone Encounter (Signed)
-----   Message from Laverle Hobby, MD sent at 03/18/2019 11:01 AM EDT ----- Regarding: VQ scan result Please inform pt that test was normal.  ----- Message ----- From: Interface, Rad Results In Sent: 03/18/2019   9:03 AM EDT To: Laverle Hobby, MD

## 2019-03-18 NOTE — Telephone Encounter (Signed)
Contacted pt and notified that Test was normal per Dr.Ramachandran. Pt verbalized understanding and not further actions necessary at this time.

## 2019-03-19 ENCOUNTER — Ambulatory Visit: Payer: Medicare Other | Admitting: Family Medicine

## 2019-03-24 ENCOUNTER — Other Ambulatory Visit: Payer: Self-pay

## 2019-03-24 ENCOUNTER — Ambulatory Visit
Admission: RE | Admit: 2019-03-24 | Discharge: 2019-03-24 | Disposition: A | Discharge status: Home / Self Care | Payer: Medicare Other | Source: Ambulatory Visit | Attending: Internal Medicine | Admitting: Internal Medicine

## 2019-03-24 DIAGNOSIS — R42 Dizziness and giddiness: Secondary | ICD-10-CM

## 2019-03-24 DIAGNOSIS — J849 Interstitial pulmonary disease, unspecified: Secondary | ICD-10-CM | POA: Diagnosis present

## 2019-03-24 IMAGING — CT CT CHEST HIGH RESOLUTION WITHOUT CONTRAST
2 of 7 series · 15 of 36 positions shown, 18 images · non-contrast
Comparison: [DATE]

CLINICAL DATA: Interstitial lung disease, worsening shortness of
breath, wheezing

EXAM:
CT CHEST WITHOUT CONTRAST
TECHNIQUE: Multidetector CT imaging of the chest was performed following the
standard protocol without intravenous contrast. High resolution
imaging of the lungs, as well as inspiratory and expiratory imaging,
was performed.

[Series 4: coronal thorax · coronal · 0.69mm/px · 3 of 167 slices shown]
[im 34/167  lung]
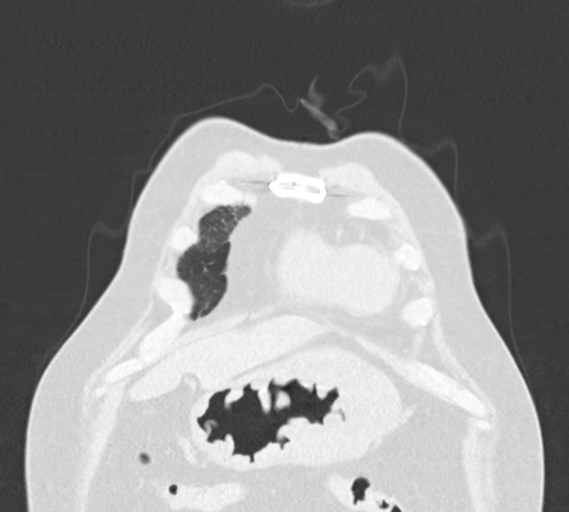
[im 67/167  lung]
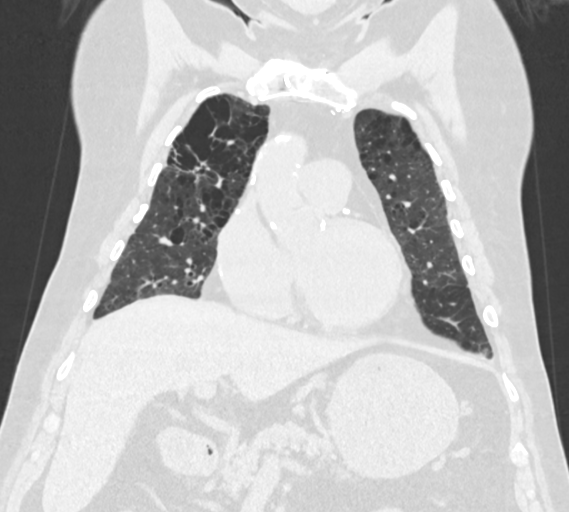
[im 100/167  lung]
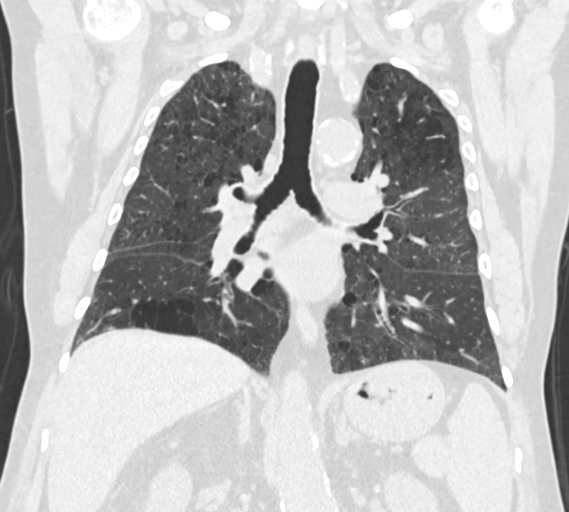

[Series 9: high res (id) thorax · axial · 0.76mm/px · z∈[-1214,-918]mm · 12 of 350 slices shown, 15 images]
[im 27/350  mediastinal]
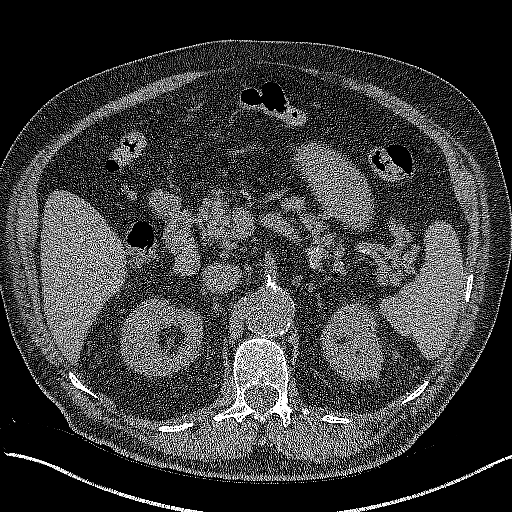
[im 27/350  lung]
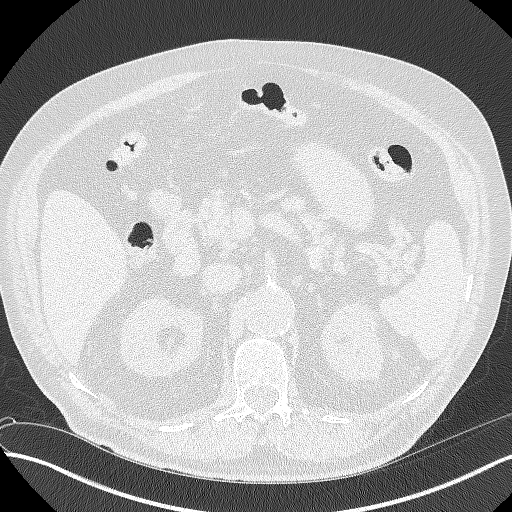
[im 54/350  lung]
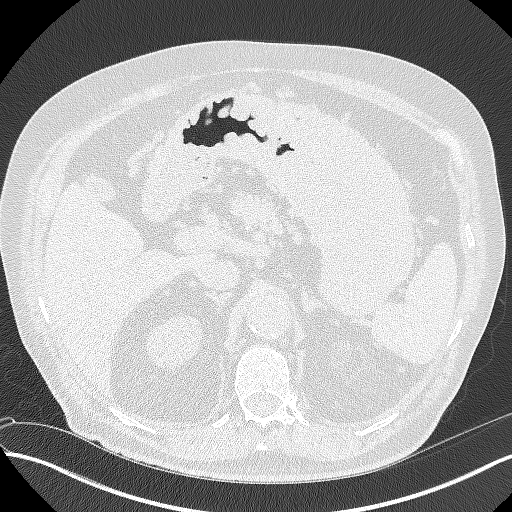
[im 81/350  lung]
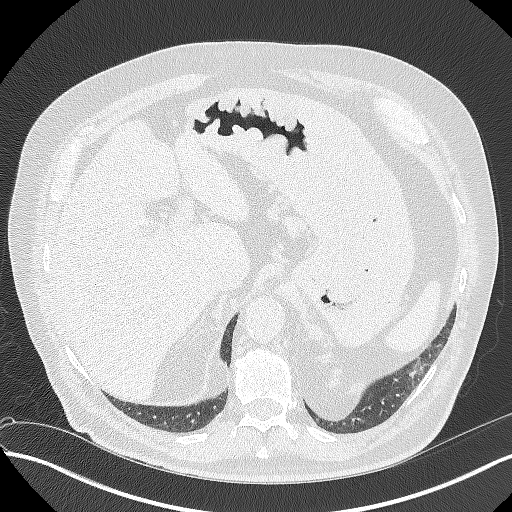
[im 108/350  lung]
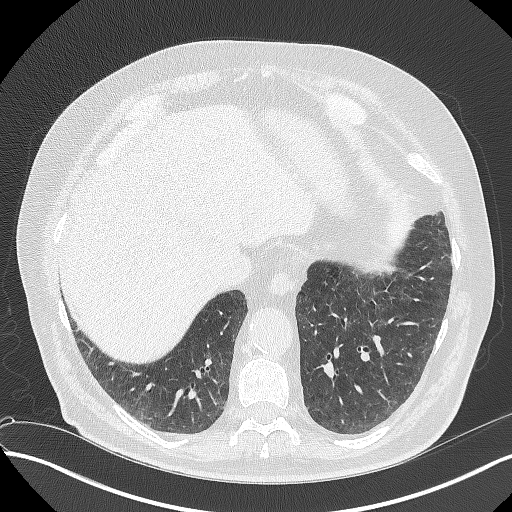
[im 135/350  mediastinal]
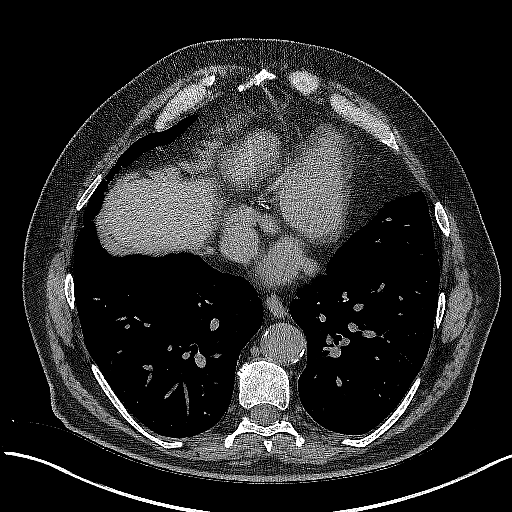
[im 135/350  lung]
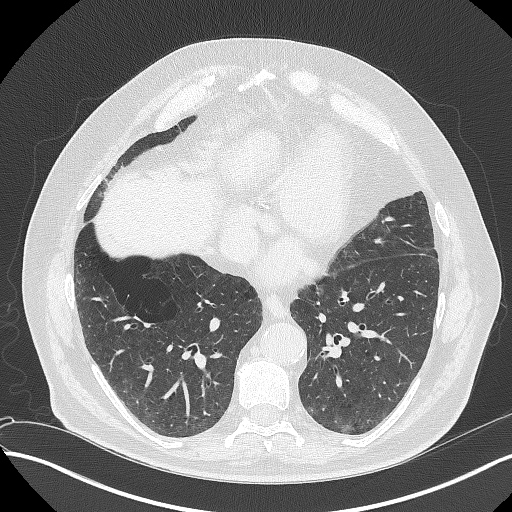
[im 162/350  lung]
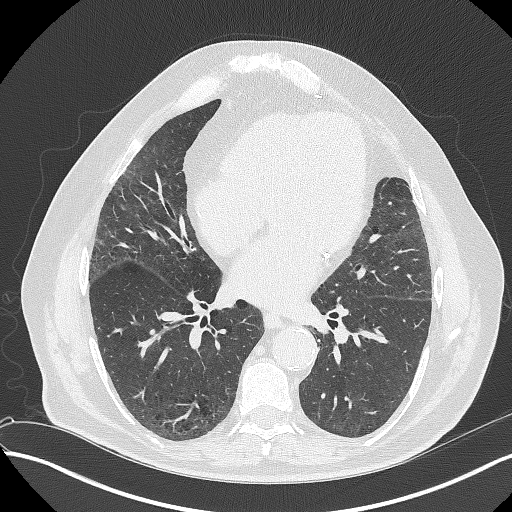
[im 188/350  lung]
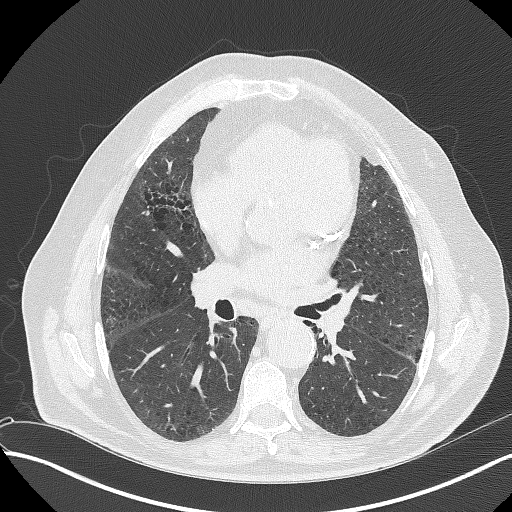
[im 215/350  lung]
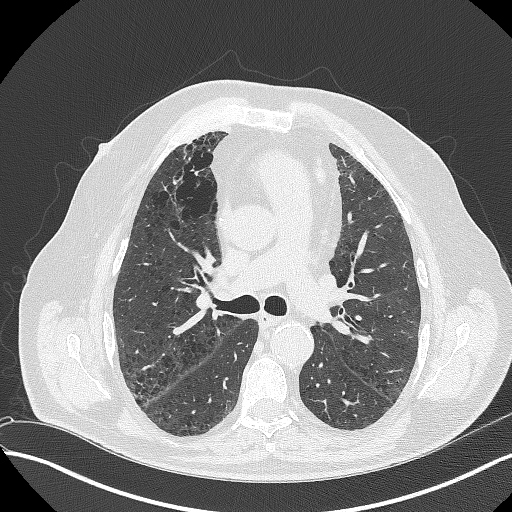
[im 242/350  mediastinal]
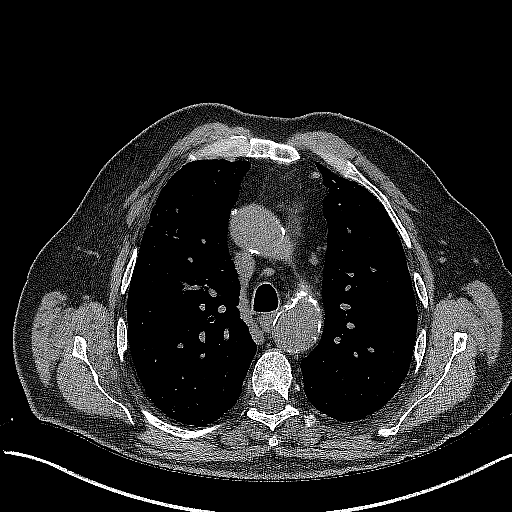
[im 242/350  lung]
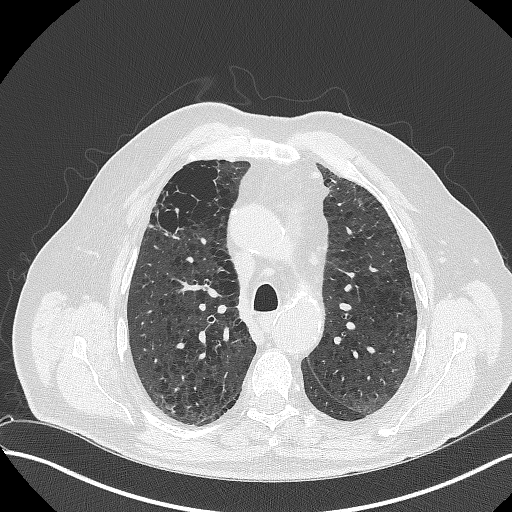
[im 269/350  lung]
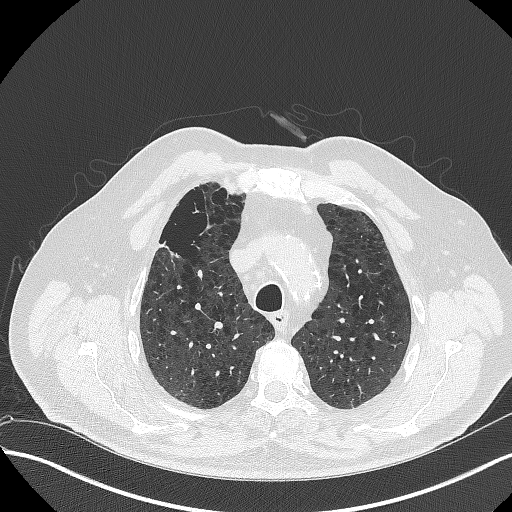
[im 296/350  lung]
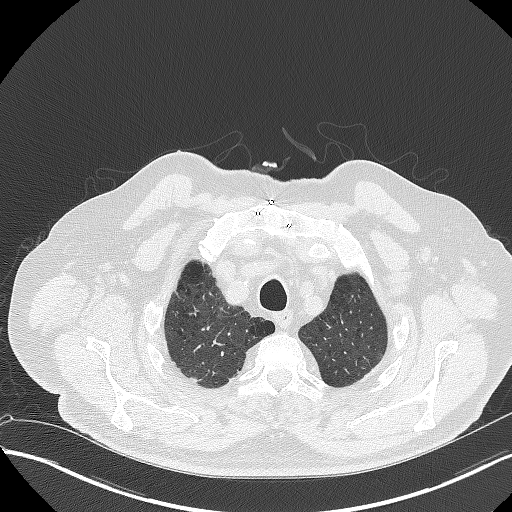
[im 323/350  lung]
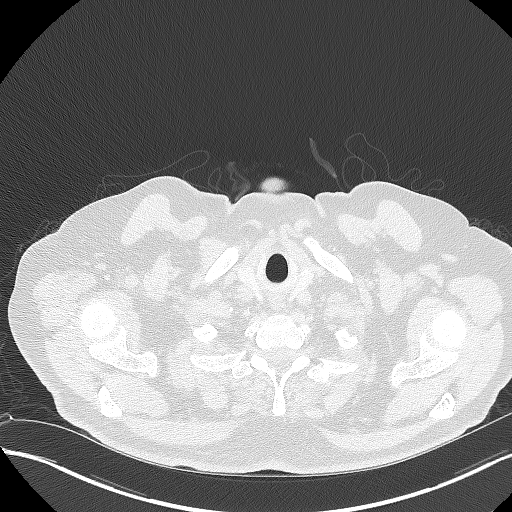

[15 of 36 positions shown; findings below may reference images not displayed]

FINDINGS: Cardiovascular: Aortic atherosclerosis. There is aneurysm of the
descending thoracic aorta measuring up to 3.7 x 3.6 cm. There is
partially imaged aneurysm of the abdominal aorta measuring at least
4.1 x 3.9 cm. Normal heart size. Extensive 3 vessel coronary artery
calcifications and/or stents. No pericardial effusion.

Mediastinum/Nodes: No enlarged mediastinal, hilar, or axillary lymph
nodes. Thyroid gland, trachea, and esophagus demonstrate no
significant findings.

Lungs/Pleura: Moderate centrilobular and paraseptal emphysema.
Diffuse bilateral bronchial wall thickening. No evidence of fibrotic
interstitial lung disease. No significant air trapping on expiratory
phase imaging. No pleural effusion or pneumothorax.

Upper Abdomen: No acute abnormality.

Musculoskeletal: No chest wall mass or suspicious bone lesions
identified.
IMPRESSION: 1. No evidence of fibrotic interstitial lung disease. No significant
air trapping on expiratory phase imaging.

2.  Emphysema.

3. Diffuse bilateral bronchial wall thickening, consistent with
smoking-related respiratory bronchiolitis.

4. Aortic atherosclerosis. There is aneurysm of the descending
thoracic aorta measuring up to 3.7 x 3.6 cm. There is partially
imaged aneurysm of the abdominal aorta measuring at least 4.1 x
cm.

5.  Coronary artery disease.

## 2019-03-25 LAB — BASIC METABOLIC PANEL
BUN: 43 — AB (ref 4–21)
Creatinine: 2.6 — AB (ref 0.6–1.3)
Glucose: 144
Potassium: 4.4 (ref 3.4–5.3)
Sodium: 138 (ref 137–147)

## 2019-03-25 LAB — CBC AND DIFFERENTIAL
HCT: 40 — AB (ref 41–53)
Hemoglobin: 12.7 — AB (ref 13.5–17.5)
Neutrophils Absolute: 6216
Platelets: 300 (ref 150–399)
WBC: 8.4

## 2019-03-26 ENCOUNTER — Encounter (INDEPENDENT_AMBULATORY_CARE_PROVIDER_SITE_OTHER): Payer: Self-pay | Admitting: Vascular Surgery

## 2019-03-26 ENCOUNTER — Ambulatory Visit (INDEPENDENT_AMBULATORY_CARE_PROVIDER_SITE_OTHER): Payer: Medicare Other | Admitting: Vascular Surgery

## 2019-03-26 ENCOUNTER — Other Ambulatory Visit: Payer: Self-pay

## 2019-03-26 VITALS — BP 116/58 | HR 120 | Resp 18 | Ht 72.0 in | Wt 210.8 lb

## 2019-03-26 DIAGNOSIS — N184 Chronic kidney disease, stage 4 (severe): Secondary | ICD-10-CM

## 2019-03-26 DIAGNOSIS — I25708 Atherosclerosis of coronary artery bypass graft(s), unspecified, with other forms of angina pectoris: Secondary | ICD-10-CM | POA: Diagnosis not present

## 2019-03-26 DIAGNOSIS — E782 Mixed hyperlipidemia: Secondary | ICD-10-CM

## 2019-03-26 DIAGNOSIS — I10 Essential (primary) hypertension: Secondary | ICD-10-CM

## 2019-03-26 DIAGNOSIS — I70213 Atherosclerosis of native arteries of extremities with intermittent claudication, bilateral legs: Secondary | ICD-10-CM

## 2019-03-26 DIAGNOSIS — I6523 Occlusion and stenosis of bilateral carotid arteries: Secondary | ICD-10-CM

## 2019-03-26 DIAGNOSIS — Z794 Long term (current) use of insulin: Secondary | ICD-10-CM

## 2019-03-26 DIAGNOSIS — E1122 Type 2 diabetes mellitus with diabetic chronic kidney disease: Secondary | ICD-10-CM

## 2019-03-27 ENCOUNTER — Telehealth: Payer: Self-pay

## 2019-03-27 ENCOUNTER — Ambulatory Visit
Admission: RE | Admit: 2019-03-27 | Discharge: 2019-03-27 | Disposition: A | Discharge status: Home / Self Care | Payer: Medicare Other | Source: Ambulatory Visit | Attending: Internal Medicine | Admitting: Internal Medicine

## 2019-03-27 DIAGNOSIS — I272 Pulmonary hypertension, unspecified: Secondary | ICD-10-CM | POA: Insufficient documentation

## 2019-03-27 DIAGNOSIS — E1122 Type 2 diabetes mellitus with diabetic chronic kidney disease: Secondary | ICD-10-CM | POA: Insufficient documentation

## 2019-03-27 DIAGNOSIS — I071 Rheumatic tricuspid insufficiency: Secondary | ICD-10-CM | POA: Diagnosis not present

## 2019-03-27 DIAGNOSIS — Z951 Presence of aortocoronary bypass graft: Secondary | ICD-10-CM | POA: Diagnosis not present

## 2019-03-27 DIAGNOSIS — I252 Old myocardial infarction: Secondary | ICD-10-CM | POA: Diagnosis not present

## 2019-03-27 DIAGNOSIS — N189 Chronic kidney disease, unspecified: Secondary | ICD-10-CM | POA: Insufficient documentation

## 2019-03-27 DIAGNOSIS — R0609 Other forms of dyspnea: Secondary | ICD-10-CM

## 2019-03-27 NOTE — Progress Notes (Signed)
*  PRELIMINARY RESULTS* Echocardiogram 2D Echocardiogram has been performed.  Sherrie Sport 03/27/2019, 11:29 AM

## 2019-03-27 NOTE — Telephone Encounter (Signed)
Copied from [place] [phone]. Topic: Referral - Request for Referral >> Mar 26, 2019 12:27 PM Rayann Heman wrote: Referral for which specialty: GI  Preferred provider/office: Dr Allen Norris [facility]  Reason for referral: wants a 2nd opinion.

## 2019-03-28 ENCOUNTER — Encounter (INDEPENDENT_AMBULATORY_CARE_PROVIDER_SITE_OTHER): Payer: Self-pay | Admitting: Vascular Surgery

## 2019-03-28 NOTE — Progress Notes (Signed)
MRN : [id number]  Dylan Goodman is a 68 y.o. (1951/04/24) male who presents with chief complaint of  Chief Complaint  Patient presents with   Follow-up    [facility] 2week and discuss MRI  .  History of Present Illness:   The patient is seen for follow up evaluation of carotid stenosis status post MRA. Patient reports that the test went well with no problems or complications.   The patient denies interval amaurosis fugax. There is no recent or interval TIA symptoms or focal motor deficits. There is no prior documented CVA.  The patient is taking enteric-coated aspirin 81 mg daily.  There is no history of migraine headaches. There is no history of seizures.  The patient has a history of coronary artery disease, no recent episodes of angina or shortness of breath. The patient denies PAD or claudication symptoms. There is a history of hyperlipidemia which is being treated with a statin.    MRA is reviewed by me personally and shows occlusion of the left ICA and 50%  stenosis consistent with calcified plaque at the origin of the right internal carotid artery.   Current Meds  Medication Sig   amLODipine (NORVASC) 5 MG tablet Take 5 mg by mouth daily.   budesonide-formoterol (SYMBICORT) 160-4.5 MCG/ACT inhaler Inhale 2 puffs into the lungs 2 (two) times daily. Rinse mouth after use   carvedilol (COREG) 6.25 MG tablet Take 6.25 mg by mouth 2 (two) times daily.    cilostazol (PLETAL) 100 MG tablet Take 100 mg by mouth 2 (two) times daily.    ELIQUIS 2.5 MG TABS tablet Take 2.5 mg by mouth 2 (two) times daily.    ezetimibe (ZETIA) 10 MG tablet Take 10 mg by mouth daily.    fenofibrate 160 MG tablet Take 160 mg by mouth daily.    Insulin Degludec (TRESIBA FLEXTOUCH) 200 UNIT/ML SOPN Inject 12 Units into the skin at bedtime.    isosorbide mononitrate (IMDUR) 120 MG 24 hr tablet Take 120 mg by mouth daily.   lansoprazole (PREVACID) 30 MG capsule Take 30 mg by mouth every  morning.    losartan (COZAAR) 25 MG tablet Take 25 mg by mouth daily.    lovastatin (MEVACOR) 40 MG tablet Take 40 mg by mouth 2 (two) times daily.    nitroGLYCERIN (NITROSTAT) 0.4 MG SL tablet Place 0.4 mg under the tongue every 5 (five) minutes as needed for chest pain.    sodium bicarbonate 650 MG tablet Take 1 tablet by mouth daily.    tamsulosin (FLOMAX) 0.4 MG CAPS capsule Take 1 capsule (0.4 mg total) by mouth daily.   ticagrelor (BRILINTA) 60 MG TABS tablet Take 60 mg by mouth 2 (two) times daily.     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 ([place]) 05/2008   Chronic depression 09/03/2018   CKD (chronic kidney disease) 09/03/2018   Coronary atherosclerosis 09/03/2018   Coronary bypass graft mechanical complication 19/6222   Diabetes mellitus without complication ([place])    Gastric ulcer with hemorrhage 09/03/2018   Gastritis 09/03/2018   GERD (gastroesophageal reflux disease) 09/03/2018   Heart disease    Heart murmur    IDA (iron deficiency anemia) 09/03/2018   Low magnesium levels 09/03/2018   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   Myocardial infarction [facility]) 2017   Personal history of radiation therapy 05/30/2018   39 treatments with chemotherapy nasopharyngeal cancer   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    Past Surgical History:  Procedure Laterality Date   CAROTID ENDARTERECTOMY  2006   COLONOSCOPY WITH PROPOFOL N/A 03/11/2019   Procedure: COLONOSCOPY WITH PROPOFOL;  Surgeon: Toledo, Benay Pike, MD;  Location: [facility] ENDOSCOPY;  Service: Gastroenterology;  Laterality: N/A;   CORONARY ARTERY BYPASS GRAFT  1996   quadruple bypass   ESOPHAGOGASTRODUODENOSCOPY (EGD) WITH PROPOFOL N/A 03/11/2019   Procedure: ESOPHAGOGASTRODUODENOSCOPY (EGD) WITH PROPOFOL;  Surgeon: Toledo, Benay Pike, MD;  Location: [facility] ENDOSCOPY;  Service: Gastroenterology;   Laterality: N/A;   heart stent  10/2010   x3   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   11 coils   left leg stents  08/2009   LOWER EXTREMITY ANGIOGRAPHY Left 11/11/2018   Procedure: LOWER EXTREMITY ANGIOGRAPHY;  Surgeon: Katha Cabal, MD;  Location: [facility];  Service: Cardiovascular;  Laterality: Left;   REVISION OF AORTA BIFEMORAL BYPASS     right femo pop bypass  06/1998   11/2001, 08/2003   right leg stent  07/2009    Social History Social History   Tobacco Use   Smoking status: Former Smoker    Packs/day: 1.50    Years: 50.00    Pack years: 75.00    Types: Cigarettes    Quit date: 02/18/2017    Years since quitting: 2.1   Smokeless tobacco: Never Used  Substance Use Topics   Alcohol use: Not Currently   Drug use: Never    Family History Family History  Problem Relation Age of Onset   Coronary artery disease Mother    Alcohol abuse Father    Colon cancer Father    Coronary artery disease Father    Kidney cancer Father    Breast cancer Sister     No Known Allergies   REVIEW OF SYSTEMS (Negative unless checked)  Constitutional: [] Weight loss  [] Fever  [] Chills Cardiac: [] Chest pain   [] Chest pressure   [] Palpitations   [] Shortness of breath when laying flat   [] Shortness of breath with exertion. Vascular:  [x] Pain in legs with walking   [] Pain in legs at rest  [] History of DVT   [] Phlebitis   [] Swelling in legs   [] Varicose veins   [] Non-healing ulcers Pulmonary:   [] Uses home oxygen   [] Productive cough   [] Hemoptysis   [] Wheeze  [] COPD   [] Asthma Neurologic:  [] Dizziness   [] Seizures   [] History of stroke   [] History of TIA  [] Aphasia   [] Vissual changes   [] Weakness or numbness in arm   [] Weakness or numbness in leg Musculoskeletal:   [] Joint swelling   [] Joint pain   [] Low back pain Hematologic:  [] Easy bruising  [] Easy bleeding   [] Hypercoagulable state   [] Anemic Gastrointestinal:  [] Diarrhea   [] Vomiting  [] Gastroesophageal  reflux/heartburn   [] Difficulty swallowing. Genitourinary:  [] Chronic kidney disease   [] Difficult urination  [] Frequent urination   [] Blood in urine Skin:  [] Rashes   [] Ulcers  Psychological:  [] History of anxiety   []  History of major depression.  Physical Examination  Vitals:   03/26/19 1015  BP: (!) 116/58  Pulse: (!) 120  Resp: 18  Weight: 210 lb 12.8 oz (95.6 kg)  Height: 6' (1.829 m)   Body mass index is 28.59 kg/m. Gen: WD/WN, NAD Head: /AT, No temporalis wasting.  Ear/Nose/Throat: Hearing grossly intact, nares w/o erythema or drainage Eyes: PER, EOMI, sclera nonicteric.  Neck: Supple, no large masses.   Pulmonary:  Good air movement,  no audible wheezing bilaterally, no use of accessory muscles.  Cardiac: RRR, no JVD Vascular:  Vessel Right Left  Radial Palpable Palpable  Brachial Palpable Palpable  Carotid Palpable Palpable  PT Palpable Palpable  DP Palpable Palpable  Gastrointestinal: Non-distended. No guarding/no peritoneal signs.  Musculoskeletal: M/S 5/5 throughout.  No deformity or atrophy.  Neurologic: CN 2-12 intact. Symmetrical.  Speech is fluent. Motor exam as listed above. Psychiatric: Judgment intact, Mood & affect appropriate for pt's clinical situation. Dermatologic: No rashes or ulcers noted.  No changes consistent with cellulitis. Lymph : No lichenification or skin changes of chronic lymphedema.  CBC Lab Results  Component Value Date   WBC 11.2 (H) 03/13/2019   HGB 12.0 (L) 03/13/2019   HCT 36.7 (L) 03/13/2019   MCV 90.6 03/13/2019   PLT 211 03/13/2019    BMET    Component Value Date/Time   NA 140 03/13/2019 0848   K 3.9 03/13/2019 0848   CL 110 03/13/2019 0848   CO2 19 (L) 03/13/2019 0848   GLUCOSE 139 (H) 03/13/2019 0848   BUN 58 (H) 03/13/2019 0848   CREATININE 2.95 (H) 03/13/2019 0848   CREATININE 3.36 (H) 02/12/2019 1450   CALCIUM 6.7 (L) 03/13/2019 1528   GFRNONAA 21 (L) 03/13/2019 0848   GFRNONAA 18 (L) 02/12/2019 1450    GFRAA 25 (L) 03/13/2019 0848   GFRAA 21 (L) 02/12/2019 1450   Estimated Creatinine Clearance: 29.9 mL/min (A) (by C-G formula based on SCr of 2.95 mg/dL (H)).  COAG Lab Results  Component Value Date   INR 1.3 (H) 03/12/2019    Radiology Ct Abdomen Pelvis Wo Contrast  Result Date: 03/12/2019 CLINICAL DATA:  Nausea vomiting post EGD. EXAM: CT ABDOMEN AND PELVIS WITHOUT CONTRAST TECHNIQUE: Multidetector CT imaging of the abdomen and pelvis was performed following the standard protocol without IV contrast. COMPARISON:  None. FINDINGS: Lower chest: Calcific atherosclerotic disease of the coronary arteries and aorta. Hepatobiliary: 1.8 cm benign-appearing cyst in the dome of the liver. Pancreas: Unremarkable. No pancreatic ductal dilatation or surrounding inflammatory changes. Spleen: Normal in size without focal abnormality. Adrenals/Urinary Tract: Adrenal glands are unremarkable. Kidneys are normal, without renal calculi, focal lesion, or hydronephrosis. Bladder is unremarkable. Stomach/Bowel: Diffuse thickening of the wall of the gastric body and cardia, with multiple gas bubbles associated with the thickened wall. No extraluminal gas. The stomach is not opacified and therefore the internal architecture of the gastric wall could not be evaluated. No evidence of small-bowel obstruction. Diffuse colonic diverticulosis without evidence of diverticulitis. Vascular/Lymphatic: Advanced atherosclerotic disease with tortuosity of the aorta. Fusiform infrarenal abdominal aortic aneurysm which measures 4 cm. Additional saccular aneurysm of the distal abdominal aorta which measures 3.8 cm, image 49/104, sequence 2. No enlarged abdominal or pelvic lymph nodes. Reproductive: Enlargement of the prostate gland. Other: Coil artifact associated with the left iliac vessels. Musculoskeletal: Spondylosis of the spine. IMPRESSION: 1. Diffuse thickening of the wall of the gastric body and cardia, with multiple gas bubbles  associated with the thickened wall. The stomach is not opacified and therefore the internal architecture of the gastric wall could not be evaluated. No extraluminal gas. 2. Advanced atherosclerotic disease of the aorta and coronary arteries. 3. Fusiform infrarenal abdominal aortic aneurysm which measures 4 cm. Additional saccular aneurysm of the distal abdominal aorta which measures 3.8 cm. Evaluation with CTA or MRA of the abdomen may be considered, if found clinically appropriate. 4. Enlargement of the prostate gland. Please correlate to serum PSA values. Electronically Signed  By: Fidela Salisbury M.D.   On: 03/12/2019 09:23   Dg Chest 2 View  Result Date: 03/18/2019 CLINICAL DATA:  Shortness of breath and dizziness for 4 days. EXAM: CHEST - 2 VIEW COMPARISON:  03/12/2019 FINDINGS: The heart size and mediastinal contours are within normal limits. Aortic atherosclerosis. Pulmonary interstitial demonstrated. No evidence of superimposed pulmonary infiltrate or edema. No evidence of pleural effusion. IMPRESSION: Stable pulmonary interstitial prominence. No active lung disease. Electronically Signed   By: Marlaine Hind M.D.   On: 03/18/2019 08:16   Dg Chest 2 View  Result Date: 03/12/2019 CLINICAL DATA:  Nausea and vomiting post upper endoscopy. Possible aspiration. EXAM: CHEST - 2 VIEW COMPARISON:  None. FINDINGS: Mild cardiomegaly. Aortic atherosclerosis. Mild hyperinflation in diffuse pulmonary interstitial prominence, consistent with COPD. No evidence of pulmonary airspace disease or pleural effusion. Prior CABG noted. IMPRESSION: Mild cardiomegaly and COPD. No active lung disease. Electronically Signed   By: Marlaine Hind M.D.   On: 03/12/2019 08:09   Mr Angio Neck Wo Contrast  Result Date: 03/13/2019 CLINICAL DATA:  Vertigo, persistent, central dizziness, TIA EXAM: MRA NECK WITHOUT CONTRAST TECHNIQUE: Angiographic images of the neck were obtained using MRA technique without intravenous contrast.  Carotid stenosis measurements (when applicable) are obtained utilizing NASCET criteria, using the distal internal carotid diameter as the denominator. COMPARISON:  Brain MRI 03/12/2019 FINDINGS: The origins of the common carotid and vertebral arteries are poorly assessed on this examination due to pulsation artifact and non-contrast technique. Beyond its origin, the right common carotid artery is patent. Irregular loss of flow related signal within the proximal/mid right common carotid artery may reflect artifact. A stenosis/filling defect at this site is difficult to exclude. Atherosclerotic irregularity at the right carotid bifurcation. Apparent narrowing of the proximal cervical right ICA with minimal diameter of 2.6 mm. When compared with the normal diameter of the more distal cervical ICA of 5.0, this corresponds with an estimated stenosis of approximately 50%. Distal to this, the cervical right ICA is patent without measurable stenosis. Beyond its origin, the left common carotid artery is patent with atherosclerotic irregularity and multifocal mild luminal narrowing. Atherosclerotic irregularity at the left carotid bifurcation. There is complete loss of flow related signal within the cervical left internal carotid artery shortly beyond its origin. No flow related signal is seen more distally within the cervical left ICA. Beyond the vessel origins, the bilateral cervical vertebral arteries are patent without measurable stenosis to the C1 level. The intracranial vertebral arteries are poorly assessed on the current examination. Impression #1 below will be called to the ordering clinician or representative by the Radiologist Assistant, and communication documented in the PACS or zVision Dashboard. IMPRESSION: - Complete loss of flow related signal within the cervical left ICA shortly beyond its origin. Findings consistent with vessel occlusion or severe stenosis. - Apparent approximate 50% narrowing of the  proximal cervical right ICA. - The origins of the bilateral common carotid and vertebral arteries are poorly assessed on this exam due to pulsation artifact and non-contrast technique. - Beyond their origins, the bilateral common carotid arteries are patent. Irregular loss of flow related signal within the proximal/mid right common carotid artery may reflect artifact. A stenosis/filling defect cannot be excluded. Consider carotid artery ultrasound for further evaluation. Atherosclerosis with regions of mild luminal narrowing in the left common carotid artery. - Beyond their origins, the vertebral arteries are patent within the neck without measurable stenosis to the C1 level. The intracranial vertebral arteries are poorly assessed on  this examination. Electronically Signed   By: Kellie Simmering   On: 03/13/2019 14:37   Mr Brain Wo Contrast  Result Date: 03/12/2019 CLINICAL DATA:  68 year old male with persistent vertigo.  Diarrhea. EXAM: MRI HEAD WITHOUT CONTRAST TECHNIQUE: Multiplanar, multiecho pulse sequences of the brain and surrounding structures were obtained without intravenous contrast. COMPARISON:  None. FINDINGS: Brain: No restricted diffusion to suggest acute infarction. No midline shift, mass effect, evidence of mass lesion, ventriculomegaly, extra-axial collection or acute intracranial hemorrhage. Cervicomedullary junction and pituitary are within normal limits. No cortical encephalomalacia identified. However, there may be mild superficial siderosis of the left superior frontal gyrus on series 8, image 22. No other chronic blood products identified though. Cerebral white matter signal is normal for age. There are small chronic lacunar infarcts of the bilateral caudate greater on the right (series 7, image 16). Negative other deep gray nuclei, brainstem and cerebellum. Vascular: The left ICA siphon flow void is asymmetrically diminished (series 7, images 7 and 8) with evidence of reconstituted flow at  the left ICA terminus. Other Major intracranial vascular flow voids are preserved. Skull and upper cervical spine: Negative visible cervical spine. Visualized bone marrow signal is within normal limits. Sinuses/Orbits: Negative orbits.  Paranasal sinuses are clear. Other: Grossly normal visible internal auditory structures. Mild right inferior mastoid fluid. Small volume retained secretions in the nasopharynx. Scalp and face soft tissues appear negative. IMPRESSION: 1. 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 Carotid Doppler or CTA should evaluate further. 2.  No acute intracranial abnormality. 3. Mild for age chronic small vessel disease in the caudate nuclei. 4. Possible mild superficial siderosis of the left superior frontal gyrus, such as due to a prior subarachnoid hemorrhage. #1 will be called to the ordering clinician or representative by the Radiologist Assistant, and communication documented in the PACS or zVision Dashboard. Electronically Signed   By: Genevie Ann M.D.   On: 03/12/2019 22:28   Ct Chest High Resolution  Result Date: 03/24/2019 CLINICAL DATA:  Interstitial lung disease, worsening shortness of breath, wheezing EXAM: CT CHEST WITHOUT CONTRAST TECHNIQUE: Multidetector CT imaging of the chest was performed following the standard protocol without intravenous contrast. High resolution imaging of the lungs, as well as inspiratory and expiratory imaging, was performed. COMPARISON:  01/27/2019 FINDINGS: Cardiovascular: Aortic atherosclerosis. 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 There is partially imaged aneurysm of the abdominal aorta measuring at least 4.1 x 3.9 cm. Normal heart size. Extensive 3 vessel coronary artery calcifications and/or stents. No pericardial effusion. Mediastinum/Nodes: No enlarged mediastinal, hilar, or axillary lymph nodes. Thyroid gland, trachea, and esophagus  demonstrate no significant findings. Lungs/Pleura: Moderate centrilobular and paraseptal emphysema. Diffuse bilateral bronchial wall thickening. No evidence of fibrotic interstitial lung disease. No significant air trapping on expiratory phase imaging. No pleural effusion or pneumothorax. Upper Abdomen: No acute abnormality. Musculoskeletal: No chest wall mass or suspicious bone lesions identified. IMPRESSION: 1. No evidence of fibrotic interstitial lung disease. No significant air trapping on expiratory phase imaging. 2.  Emphysema. 3. Diffuse bilateral bronchial wall thickening, consistent with smoking-related respiratory bronchiolitis. 4. Aortic atherosclerosis. 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 There is partially imaged aneurysm of the abdominal aorta measuring at least 4.1 x 3.9 cm. 5.  Coronary artery disease. Electronically Signed   By: Dorna Bloom.D.  On: 03/24/2019 13:29   Mr Hip Left Wo Contrast  Result Date: 03/12/2019 CLINICAL DATA:  Chronic hip pain, tendonitis EXAM: MR OF THE LEFT HIP WITHOUT CONTRAST TECHNIQUE: Multiplanar, multisequence MR imaging was performed. No intravenous contrast was administered. COMPARISON:  CT March 12, 2019 FINDINGS: Bones: There is no evidence of acute fracture, dislocation or avascular necrosis. The visualized bony pelvis appears normal. The visualized sacroiliac joints and symphysis pubis appear normal. Articular cartilage and labrum Articular cartilage: There is mild chondral thinning seen at the superior femoroacetabular joint. Labrum: There is no gross labral tear or paralabral abnormality. Joint or bursal effusion Joint effusion: No significant hip joint effusion. Bursae: No focal periarticular fluid collection. Muscles and tendons Muscles and tendons: There is mild gluteal insertional tendinosis. The remainder of the tendons are intact. The piriformis muscles appear symmetric. Other findings Miscellaneous: The  visualized internal pelvic contents appear unremarkable. Degenerative changes are seen in the lower lumbar spine. IMPRESSION: Mild femoroacetabular joint osteoarthritis. Mild insertional gluteal tendinosis. Electronically Signed   By: Prudencio Pair M.D.   On: 03/12/2019 22:51   Nm Pulmonary Perf And Vent  Result Date: 03/18/2019 CLINICAL DATA:  Dyspnea, PE suspected EXAM: NUCLEAR MEDICINE VENTILATION - PERFUSION LUNG SCAN TECHNIQUE: Ventilation images were obtained in multiple projections using inhaled aerosol Tc-52m DTPA. Perfusion images were obtained in multiple projections after intravenous injection of Tc-82m MAA. RADIOPHARMACEUTICALS:  31.8 mCi of Tc-63m DTPA aerosol inhalation and 4.5 mCi Tc77m MAA IV COMPARISON:  None. FINDINGS: Ventilation: No focal ventilation defect. Perfusion: No wedge shaped peripheral perfusion defects to suggest acute pulmonary embolism. IMPRESSION: Very low probability examination for pulmonary embolism by modified PIOPED criteria (PE absent). Electronically Signed   By: Eddie Candle M.D.   On: 03/18/2019 09:00     Assessment/Plan 1. Bilateral carotid artery stenosis Recommend:  Given the patient's asymptomatic subcritical stenosis no further invasive testing or surgery at this time.  Duplex ultrasound shows right 50% and left occlusion.  Continue antiplatelet therapy as prescribed Continue management of CAD, HTN and Hyperlipidemia Healthy heart diet,  encouraged exercise at least 4 times per week Follow up in 12 months with duplex ultrasound and physical exam  - VAS US CAROTID; Future  2. Atherosclerosis of native artery of both lower extremities with intermittent claudication (HCC)  Recommend:  The patient has evidence of atherosclerosis of the lower extremities with claudication.  The patient does not voice lifestyle limiting changes at this point in time.  Noninvasive studies do not suggest clinically significant change.  No invasive studies,  angiography or surgery at this time The patient should continue walking and begin a more formal exercise program.  The patient should continue antiplatelet therapy and aggressive treatment of the lipid abnormalities  No changes in the patient's medications at this time  The patient should continue wearing graduated compression socks 10-15 mmHg strength to control the mild edema.   - VAS [place] ABI WITH/WO TBI; Future  3. Benign essential HTN Continue antihypertensive medications as already ordered, these medications have been reviewed and there are no changes at this time.   4. Coronary artery disease of bypass graft of native heart with stable angina pectoris ([place]) Continue cardiac and antihypertensive medications as already ordered and reviewed, no changes at this time.  Continue statin as ordered and reviewed, no changes at this time  Nitrates PRN for chest pain   5. Type 2 diabetes mellitus with stage 4 chronic kidney disease, with long-term current use of insulin (HCC) Continue hypoglycemic  medications as already ordered, these medications have been reviewed and there are no changes at this time.  Hgb A1C to be monitored as already arranged by primary service   6. Hyperlipidemia, mixed Continue statin as ordered and reviewed, no changes at this time      Hortencia Pilar, MD  03/28/2019 4:59 PM

## 2019-03-29 ENCOUNTER — Other Ambulatory Visit: Payer: Self-pay | Admitting: Family Medicine

## 2019-03-29 DIAGNOSIS — K529 Noninfective gastroenteritis and colitis, unspecified: Secondary | ICD-10-CM

## 2019-03-30 ENCOUNTER — Encounter: Payer: Self-pay | Admitting: Family Medicine

## 2019-04-01 ENCOUNTER — Encounter: Payer: Self-pay | Admitting: Family Medicine

## 2019-04-01 ENCOUNTER — Other Ambulatory Visit
Admission: RE | Admit: 2019-04-01 | Discharge: 2019-04-01 | Disposition: A | Discharge status: Home / Self Care | Payer: Medicare Other | Source: Ambulatory Visit | Attending: Gastroenterology | Admitting: Gastroenterology

## 2019-04-01 ENCOUNTER — Ambulatory Visit (INDEPENDENT_AMBULATORY_CARE_PROVIDER_SITE_OTHER): Payer: Medicare Other | Admitting: Family Medicine

## 2019-04-01 ENCOUNTER — Other Ambulatory Visit: Payer: Self-pay

## 2019-04-01 VITALS — BP 100/68 | HR 95 | Temp 97.3°F | Resp 16 | Ht 72.0 in | Wt 212.0 lb

## 2019-04-01 DIAGNOSIS — K529 Noninfective gastroenteritis and colitis, unspecified: Secondary | ICD-10-CM | POA: Insufficient documentation

## 2019-04-01 DIAGNOSIS — I6523 Occlusion and stenosis of bilateral carotid arteries: Secondary | ICD-10-CM | POA: Diagnosis not present

## 2019-04-01 DIAGNOSIS — I952 Hypotension due to drugs: Secondary | ICD-10-CM

## 2019-04-01 DIAGNOSIS — K2960 Other gastritis without bleeding: Secondary | ICD-10-CM | POA: Diagnosis not present

## 2019-04-01 LAB — GASTROINTESTINAL PANEL BY PCR, STOOL (REPLACES STOOL CULTURE)

## 2019-04-01 LAB — C DIFFICILE QUICK SCREEN W PCR REFLEX
C Diff antigen: NEGATIVE
C Diff interpretation: NOT DETECTED
C Diff toxin: NEGATIVE

## 2019-04-01 NOTE — Progress Notes (Signed)
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 confirmatory testing may be necessary for epidemiological  and / or clinical management purposes  to differentiate between  SARS-CoV-2 and other Sarbecovirus currently known to infect humans.  If clinically indicated additional testing with an alternate test  methodology [phone]) is advised. The SARS-CoV-2 RNA is generally  detectable in upper and lower respiratory sp ecimens during the acute  phase of infection. The expected result is Negative. Fact Sheet for Patients:   StrictlyIdeas.no Fact Sheet for Healthcare Providers: BankingDealers.co.za This test is not yet approved or cleared by the [place] FDA and has been authorized for detection and/or diagnosis of SARS-CoV-2 by FDA under an Emergency Use Authorization (EUA).  This EUA will remain in effect (meaning this test can be used) for the duration of the COVID-19 declaration under Section 564(b)(1) of the Act, 21 U.S.C. section 360bbb-3(b)(1), unless the authorization is terminated or revoked sooner. Performed at [facility], [address] [place], [place] [address]   Glucose, capillary     Status: Abnormal   Collection Time: 03/12/19  2:40 PM  Result Value Ref Range   Glucose-Capillary 111 (H) 70 - 99 mg/dL   Comment 1 Notify RN    Comment 2 Document in Chart   Urinalysis, Complete  w Microscopic     Status: Abnormal   Collection Time: 03/12/19  6:04 PM  Result Value Ref Range   Color, Urine YELLOW (A) YELLOW   APPearance CLEAR (A) CLEAR   Specific Gravity, Urine 1.012 1.005 - 1.030   pH 5.0 5.0 - 8.0   Glucose, UA NEGATIVE NEGATIVE mg/dL   Hgb urine dipstick SMALL (A) NEGATIVE   Bilirubin Urine NEGATIVE NEGATIVE   Ketones, ur NEGATIVE NEGATIVE mg/dL   Protein, ur 30 (A) NEGATIVE mg/dL   Nitrite NEGATIVE NEGATIVE   Leukocytes,Ua NEGATIVE NEGATIVE   RBC / HPF 0-5 0 - 5 RBC/hpf   WBC, UA 0-5 0 - 5 WBC/hpf   Bacteria, UA NONE SEEN NONE SEEN   Squamous Epithelial / LPF NONE SEEN 0 - 5    Comment: Performed at [facility], [name] [place], [place] [address]  Glucose, capillary     Status: Abnormal   Collection Time: 03/12/19  6:18 PM  Result Value Ref Range   Glucose-Capillary 148 (H) 70 - 99 mg/dL  Glucose, capillary     Status: Abnormal   Collection Time: 03/12/19 10:33 PM  Result Value Ref Range   Glucose-Capillary 150 (H) 70 - 99 mg/dL  Glucose, capillary     Status: None   Collection Time: 03/13/19   7:56 AM  Result Value Ref Range   Glucose-Capillary 94 70 - 99 mg/dL   Comment 1 Notify RN   HIV antibody (Routine Testing)     Status: None   Collection Time: 03/13/19  8:48 AM  Result Value Ref Range   HIV Screen 4th Generation wRfx Non Reactive Non Reactive    Comment: (NOTE) Performed At: [facility] [place], [place] [id number] Rush Farmer MD UK:[id number]   Basic metabolic panel     Status: Abnormal   Collection Time: 03/13/19  8:48 AM  Result Value Ref Range   Sodium 140 135 - 145 mmol/L   Potassium 3.9 3.5 - 5.1 mmol/L   Chloride 110 98 - 111 mmol/L   CO2 19 (L) 22 - 32 mmol/L   Glucose, Bld 139 (H) 70 - 99 mg/dL   BUN 58 (H) 8 - 23 mg/dL   Creatinine, Ser 2.95 (H) 0.61 - 1.24 mg/dL   Calcium 6.4 (LL) 8.9 - 10.3 mg/dL    Comment: CRITICAL RESULT CALLED TO, READ BACK BY AND VERIFIED WITH MONIQUE JACOBS @1015  ON 03/13/2019 BY FMW    GFR calc non Af Amer 21 (L) >60 mL/min   GFR calc Af Amer 25 (L) >60 mL/min   Anion gap 11 5 - 15    Comment: Performed at [facility], [address] [place], [place] [address]  CBC     Status: Abnormal   Collection Time: 03/13/19  8:48 AM  Result Value Ref Range   WBC 11.2 (H) 4.0 - 10.5 K/uL   RBC 4.05 (L) 4.22 - 5.81 MIL/uL   Hemoglobin 12.0 (L) 13.0 - 17.0 g/dL   HCT 36.7 (L) 39.0 - 52.0 %   MCV 90.6 80.0 - 100.0 fL   MCH 29.6 26.0 - 34.0 pg   MCHC 32.7 30.0 - 36.0 g/dL   RDW 14.1 11.5 - 15.5 %   Platelets 211 150 - 400 K/uL   nRBC 0.0 0.0 - 0.2 %    Comment: Performed at [facility], [address] [place], [place] [address]  Glucose, capillary     Status: Abnormal   Collection Time:  03/13/19 11:46 AM  Result Value Ref Range   Glucose-Capillary 129 (H) 70 - 99 mg/dL   Comment 1 Notify RN   VITAMIN D 25 Hydroxy (Vit-D Deficiency, Fractures)     Status: Abnormal   Collection Time: 03/13/19 12:36 PM  Result Value Ref Range   Vit D, 25-Hydroxy 20.1 (L) 30.0 - 100.0 ng/mL     Comment: (NOTE) Vitamin D deficiency has been defined by the [place] practice guideline as a level of serum 25-OH vitamin D less than 20 ng/mL (1,2). The Endocrine Society went on to further define vitamin D insufficiency as a level between 21 and 29 ng/mL (2). 1. IOM (Institute of Medicine). 2010. Dietary reference   intakes for calcium and D. [place]: The   [employer]. 2. Holick MF, Binkley Quinlan, Bischoff-Ferrari HA, et al.   Evaluation, treatment, and prevention of vitamin D   deficiency: an Endocrine Society clinical practice   guideline. JCEM. 2011 Jul; 96(7):1911-30. Performed At: [facility] [place], [place] [id number] Rush Farmer MD AO:[id number]   Phosphorus     Status: None   Collection Time: 03/13/19 12:36 PM  Result Value Ref Range   Phosphorus 2.8 2.5 - 4.6 mg/dL    Comment: Performed at [facility], [address] [place], [place] [address]  Calcium     Status: Abnormal   Collection Time: 03/13/19  3:28 PM  Result Value Ref Range   Calcium 6.7 (L) 8.9 - 10.3 mg/dL    Comment: Performed at [facility], [address] [place], [place] [address]  CBC and differential     Status: Abnormal   Collection Time: 03/25/19 12:00 AM  Result Value Ref Range   Hemoglobin 12.7 (A) 13.5 - 17.5   HCT 40 (A) 41 - 53   Neutrophils Absolute 6,216    Platelets 300 150 - 399   WBC 8.4   Basic metabolic panel     Status: Abnormal   Collection Time: 03/25/19 12:00 AM  Result Value Ref Range   Glucose 144    BUN 43 (A) 4 - 21   Creatinine 2.6 (A) 0.6 - 1.3   Potassium 4.4 3.4 - 5.3   Sodium 138 137 - 147      PHQ2/9: Depression screen [facility] 2/9 04/01/2019 03/10/2019 02/12/2019 01/16/2019 09/08/2018  Decreased Interest 0 0 0 0 0  Down, Depressed, Hopeless 0 0 0 0 0  PHQ - 2 Score 0 0 0 0 0  Altered sleeping 0 0 0 0 -  Tired, decreased energy 0 0 0 0 -  Change in appetite 0 0 0  0 -  Feeling bad or failure about yourself  0 0 0 0 -  Trouble concentrating 0 0 0 0 -  Moving slowly or fidgety/restless 0 0 0 0 -  Suicidal thoughts 0 0 0 0 -  PHQ-9 Score 0 0 0 0 -  Difficult doing work/chores - - Not difficult at all Not difficult at all -    phq 9 is negative   Fall Risk: Fall Risk  04/01/2019 03/10/2019 02/12/2019 01/16/2019 09/08/2018  Falls in the past year? 0 0 0 0 0  Number falls in past yr: 0 0 0 0 0  Injury with Fall? 0 0 0 0 0     Assessment & Plan  1. Chronic diarrhea  Changing GI, going to see Dr. 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 Hypotension due to drugs  Improved with rest, discussed staying hydrated, get up slowly, electrolyte solution without sugar and we will send note to Dr. Nehemiah Massed

## 2019-04-02 ENCOUNTER — Other Ambulatory Visit: Payer: Self-pay

## 2019-04-02 ENCOUNTER — Other Ambulatory Visit: Payer: Self-pay | Admitting: Family Medicine

## 2019-04-02 DIAGNOSIS — Z20828 Contact with and (suspected) exposure to other viral communicable diseases: Secondary | ICD-10-CM

## 2019-04-02 DIAGNOSIS — Z20822 Contact with and (suspected) exposure to covid-19: Secondary | ICD-10-CM

## 2019-04-03 LAB — NOVEL CORONAVIRUS, NAA: SARS-CoV-2, NAA: NOT DETECTED

## 2019-04-05 ENCOUNTER — Other Ambulatory Visit: Payer: Self-pay

## 2019-04-05 ENCOUNTER — Encounter: Payer: Self-pay | Admitting: Emergency Medicine

## 2019-04-05 ENCOUNTER — Emergency Department
Admission: EM | Admit: 2019-04-05 | Discharge: 2019-04-05 | Disposition: A | Discharge status: Home / Self Care | Payer: Medicare Other | Attending: Emergency Medicine | Admitting: Emergency Medicine

## 2019-04-05 ENCOUNTER — Encounter: Payer: Self-pay | Admitting: Family Medicine

## 2019-04-05 ENCOUNTER — Emergency Department: Payer: Medicare Other

## 2019-04-05 DIAGNOSIS — R197 Diarrhea, unspecified: Secondary | ICD-10-CM | POA: Insufficient documentation

## 2019-04-05 DIAGNOSIS — F410 Panic disorder [episodic paroxysmal anxiety] without agoraphobia: Secondary | ICD-10-CM | POA: Insufficient documentation

## 2019-04-05 DIAGNOSIS — E1122 Type 2 diabetes mellitus with diabetic chronic kidney disease: Secondary | ICD-10-CM | POA: Insufficient documentation

## 2019-04-05 DIAGNOSIS — Z87891 Personal history of nicotine dependence: Secondary | ICD-10-CM | POA: Diagnosis not present

## 2019-04-05 DIAGNOSIS — R112 Nausea with vomiting, unspecified: Secondary | ICD-10-CM

## 2019-04-05 DIAGNOSIS — I5022 Chronic systolic (congestive) heart failure: Secondary | ICD-10-CM | POA: Insufficient documentation

## 2019-04-05 DIAGNOSIS — I130 Hypertensive heart and chronic kidney disease with heart failure and stage 1 through stage 4 chronic kidney disease, or unspecified chronic kidney disease: Secondary | ICD-10-CM | POA: Diagnosis not present

## 2019-04-05 DIAGNOSIS — Z85818 Personal history of malignant neoplasm of other sites of lip, oral cavity, and pharynx: Secondary | ICD-10-CM | POA: Insufficient documentation

## 2019-04-05 DIAGNOSIS — K2960 Other gastritis without bleeding: Secondary | ICD-10-CM | POA: Insufficient documentation

## 2019-04-05 DIAGNOSIS — Z79899 Other long term (current) drug therapy: Secondary | ICD-10-CM | POA: Insufficient documentation

## 2019-04-05 DIAGNOSIS — I2510 Atherosclerotic heart disease of native coronary artery without angina pectoris: Secondary | ICD-10-CM | POA: Diagnosis not present

## 2019-04-05 DIAGNOSIS — R42 Dizziness and giddiness: Secondary | ICD-10-CM | POA: Diagnosis not present

## 2019-04-05 DIAGNOSIS — Z85828 Personal history of other malignant neoplasm of skin: Secondary | ICD-10-CM | POA: Insufficient documentation

## 2019-04-05 DIAGNOSIS — N189 Chronic kidney disease, unspecified: Secondary | ICD-10-CM | POA: Insufficient documentation

## 2019-04-05 DIAGNOSIS — Z951 Presence of aortocoronary bypass graft: Secondary | ICD-10-CM | POA: Diagnosis not present

## 2019-04-05 LAB — URINALYSIS, COMPLETE (UACMP) WITH MICROSCOPIC
Bacteria, UA: NONE SEEN
Bilirubin Urine: NEGATIVE
Glucose, UA: NEGATIVE mg/dL
Ketones, ur: NEGATIVE mg/dL
Leukocytes,Ua: NEGATIVE
Nitrite: NEGATIVE
Protein, ur: 100 mg/dL — AB
Specific Gravity, Urine: 1.009 (ref 1.005–1.030)
Squamous Epithelial / HPF: NONE SEEN (ref 0–5)
pH: 5 (ref 5.0–8.0)

## 2019-04-05 LAB — BASIC METABOLIC PANEL
Anion gap: 13 (ref 5–15)
BUN: 33 mg/dL — ABNORMAL HIGH (ref 8–23)
CO2: 14 mmol/L — ABNORMAL LOW (ref 22–32)
Calcium: 6.5 mg/dL — ABNORMAL LOW (ref 8.9–10.3)
Chloride: 108 mmol/L (ref 98–111)
Creatinine, Ser: 2.09 mg/dL — ABNORMAL HIGH (ref 0.61–1.24)
GFR calc Af Amer: 37 mL/min — ABNORMAL LOW (ref >60)
GFR calc non Af Amer: 32 mL/min — ABNORMAL LOW (ref >60)
Glucose, Bld: 106 mg/dL — ABNORMAL HIGH (ref 70–99)
Potassium: 4 mmol/L (ref 3.5–5.1)
Sodium: 135 mmol/L (ref 135–145)

## 2019-04-05 LAB — CBC
HCT: 40.4 % (ref 39.0–52.0)
Hemoglobin: 13.1 g/dL (ref 13.0–17.0)
MCH: 29.3 pg (ref 26.0–34.0)
MCHC: 32.4 g/dL (ref 30.0–36.0)
MCV: 90.4 fL (ref 80.0–100.0)
Platelets: 255 10*3/uL (ref 150–400)
RBC: 4.47 MIL/uL (ref 4.22–5.81)
RDW: 14.1 % (ref 11.5–15.5)
WBC: 8.5 10*3/uL (ref 4.0–10.5)
nRBC: 0 % (ref 0.0–0.2)

## 2019-04-05 LAB — TROPONIN I (HIGH SENSITIVITY)
Troponin I (High Sensitivity): 21 ng/L — ABNORMAL HIGH (ref <18)
Troponin I (High Sensitivity): 24 ng/L — ABNORMAL HIGH (ref <18)

## 2019-04-05 IMAGING — CT CT ABD-PELV W/ CM
2 of 5 series · 16 of 46 positions shown, 18 images · IV contrast (APPLIED)
Comparison: [DATE]

CLINICAL DATA: Nausea and vomiting

EXAM:
CT ABDOMEN AND PELVIS WITH CONTRAST
TECHNIQUE: Multidetector CT imaging of the abdomen and pelvis was performed
using the standard protocol following bolus administration of
intravenous contrast.
CONTRAST:  75mL OMNIPAQUE IOHEXOL 300 MG/ML  SOLN

[Series 2: routine abd/pel with · axial · 0.82mm/px · z∈[-735,-320]mm · 13 of 95 slices shown, 15 images]
[im 6/95  soft-tissue]
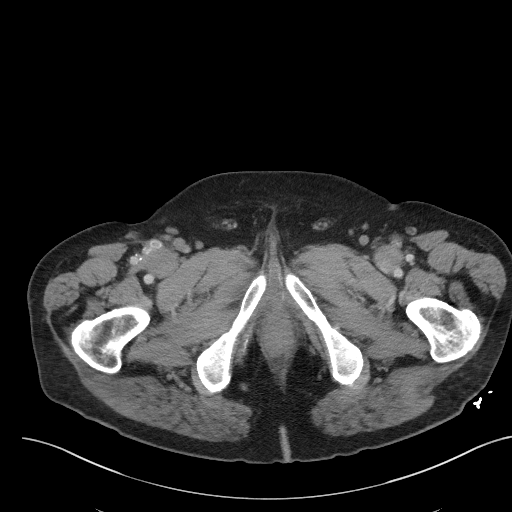
[im 6/95  bone]
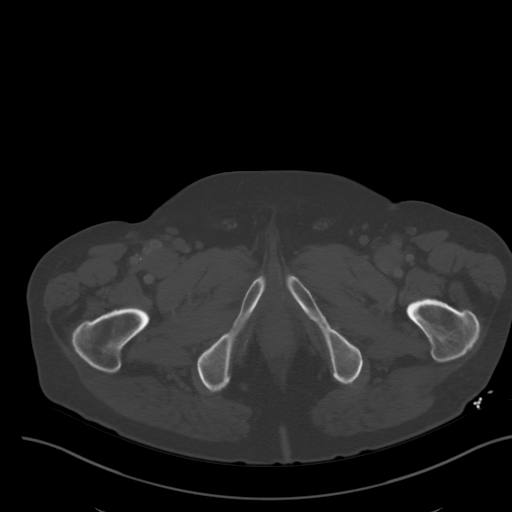
[im 11/95  soft-tissue]
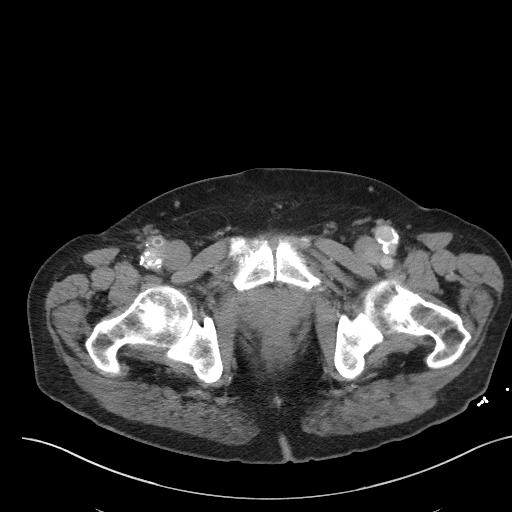
[im 21/95  soft-tissue]
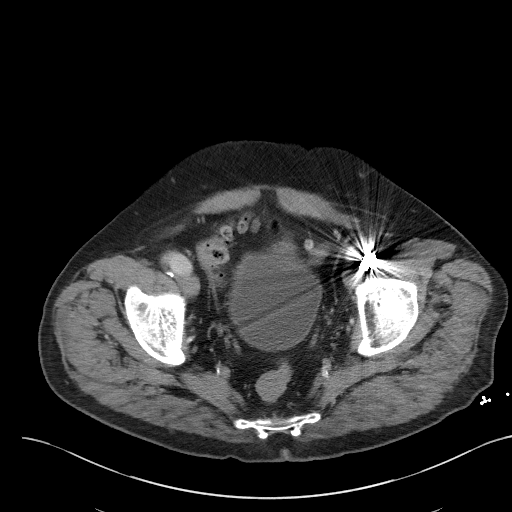
[im 27/95  soft-tissue]
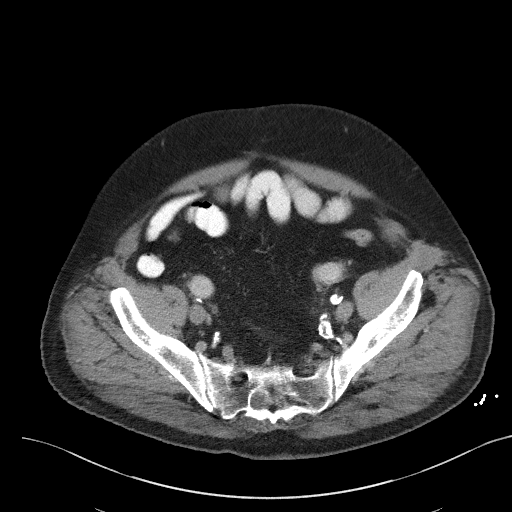
[im 32/95  soft-tissue]
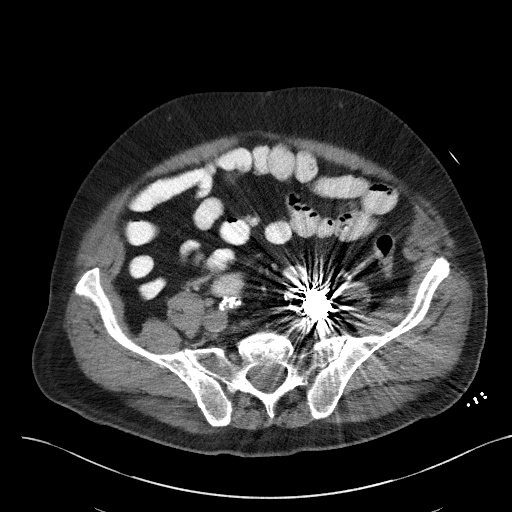
[im 42/95  soft-tissue]
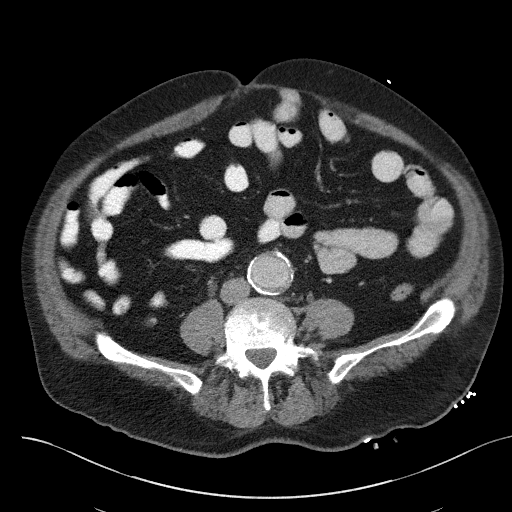
[im 48/95  soft-tissue]
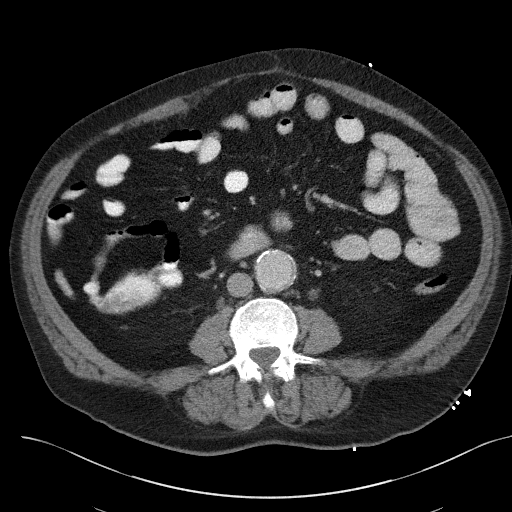
[im 53/95  soft-tissue]
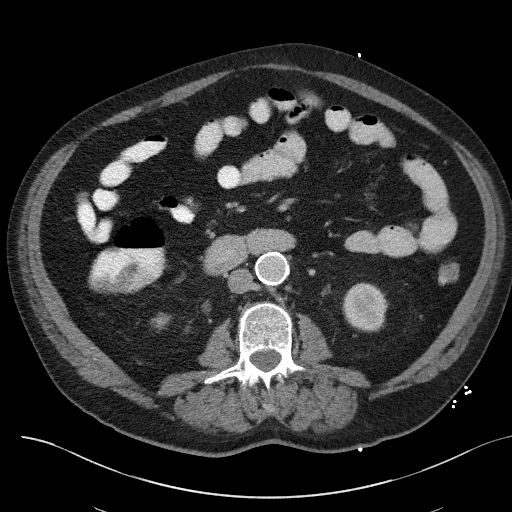
[im 63/95  soft-tissue]
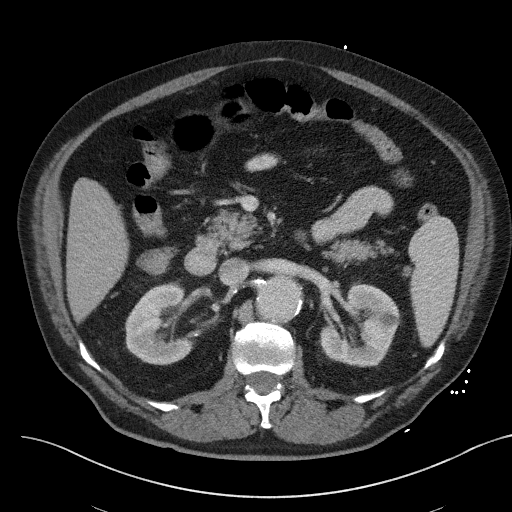
[im 63/95  bone]
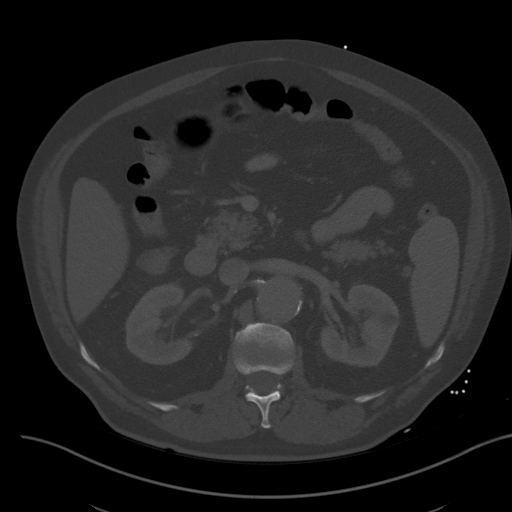
[im 68/95  soft-tissue]
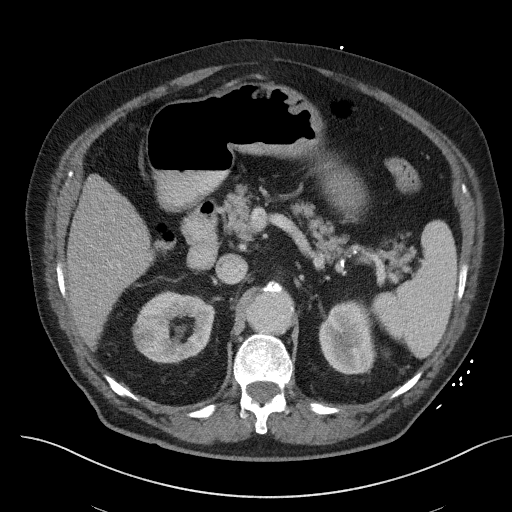
[im 74/95  soft-tissue]
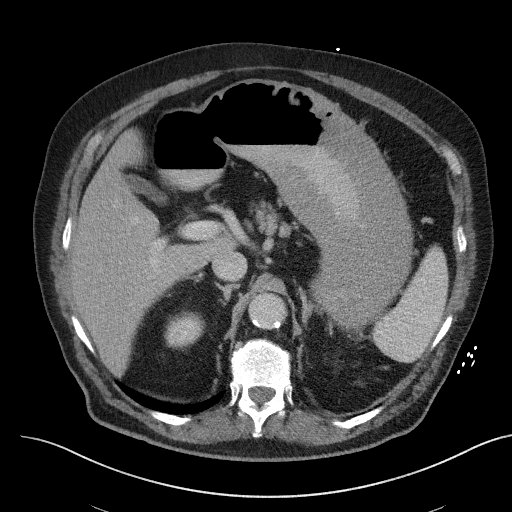
[im 84/95  soft-tissue]
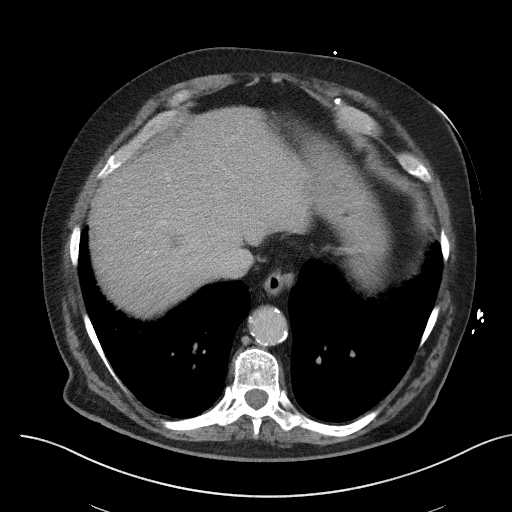
[im 89/95  soft-tissue]
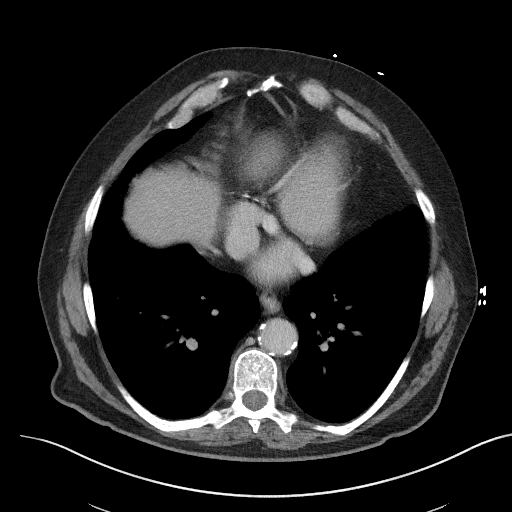

[Series 5: coronal st · coronal · 0.79mm/px · 3 of 113 slices shown]
[im 38/113  soft-tissue]
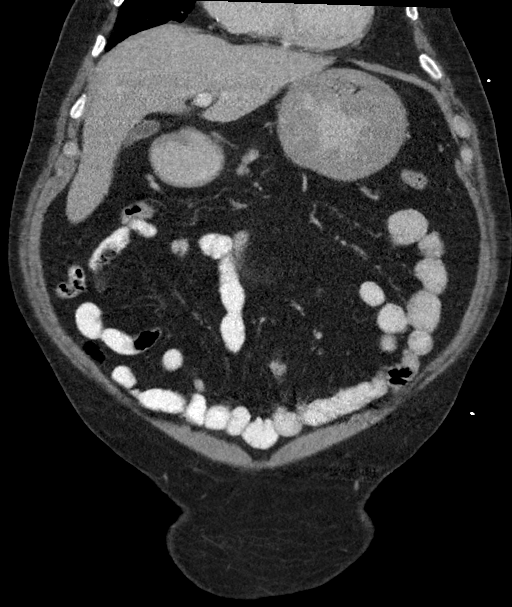
[im 50/113  soft-tissue]
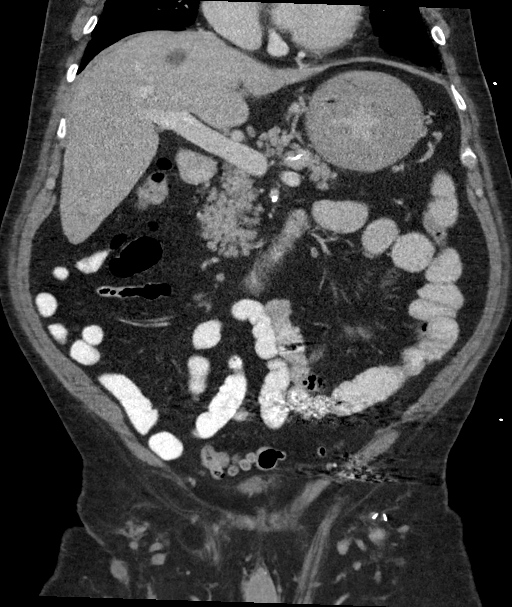
[im 63/113  soft-tissue]
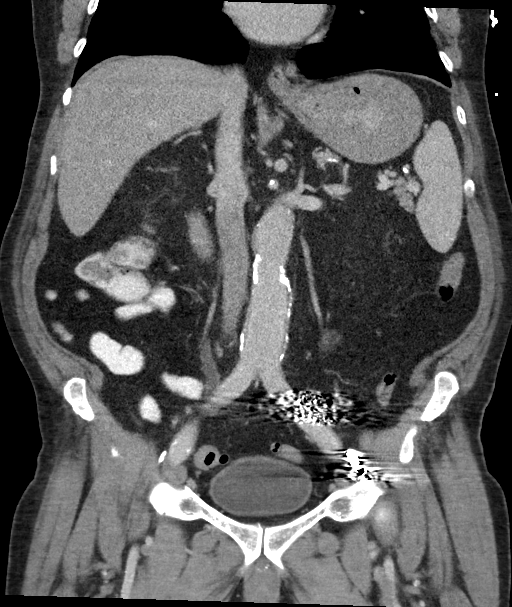

[16 of 46 positions shown; findings below may reference images not displayed]

FINDINGS: Lower chest: No acute abnormality.

Hepatobiliary: Stable 1.9 cm cyst at the right hepatic dome. Liver
otherwise unremarkable. Gallbladder unremarkable. No intrahepatic
biliary dilatation.

Pancreas: Unremarkable. No pancreatic ductal dilatation or
surrounding inflammatory changes.

Spleen: Normal in size without focal abnormality.

Adrenals/Urinary Tract: Unremarkable adrenal glands. Mild prominence
of the bilateral renal pelvises without hydronephrosis. Somewhat
irregular nodular appearance within the midpole of the right kidney
(series 5, image 78). Urinary bladder appears unremarkable.

Stomach/Bowel: Marked diffuse gastric wall thickening. Small bowel
is well opacified with enteric contrast. No dilated loops. Extensive
sigmoid diverticulosis. No focal colonic wall thickening or
pericolonic inflammatory changes.

Vascular/Lymphatic: Redemonstration of saccular aneurysm chest
distal to the take-off of the renal arteries measuring 3.8 cm
(series 5, image 68), unchanged. Fusiform infrarenal abdominal
aortic aneurysm measuring approximately 3.8 cm, also unchanged.
Metallic streak artifact from intravascular coils within the left
hemipelvis. Extensive aortoiliac atherosclerosis. No abdominopelvic
lymphadenopathy.

Reproductive: Similar prostatomegaly.

Other: No abdominal wall hernia or abnormality. No abdominopelvic
ascites.

Musculoskeletal: No acute or significant osseous findings.
IMPRESSION: 1. Marked diffuse gastric wall thickening, likely reflecting
hypertrophic gastritis (IYANA disease), which is a documented
diagnosis in the patient's electronic medical record.
2. Extensive colonic diverticulosis without evidence of acute
diverticulitis.
3. Subtle contour abnormality of the mid pole of the right kidney,
which may represent a prominent renal lobulation. An underlying mass
in this location is difficult to exclude. Correlation with
nonemergent renal ultrasound is recommended.
4. Fusiform and saccular infrarenal abdominal aortic aneurysms,
stable compared to prior study. Recommend followup by ultrasound in
2 years. This recommendation follows ACR consensus guidelines: White
Paper of the ACR Incidental Findings Committee II on Vascular
Findings. [HOSPITAL] [6X]; [DATE].

## 2019-04-05 MED ORDER — ONDANSETRON 4 MG PO TBDP
4.0000 mg | ORAL_TABLET | Freq: Once | ORAL | Status: AC
  Administered 2019-04-05: 4 mg via ORAL
  Filled 2019-04-05: qty 1

## 2019-04-05 MED ORDER — IOHEXOL 300 MG/ML  SOLN
75.0000 mL | Freq: Once | INTRAMUSCULAR | Status: AC | PRN
  Administered 2019-04-05: 16:00:00 75 mL via INTRAVENOUS

## 2019-04-05 MED ORDER — SODIUM CHLORIDE 0.9 % IV BOLUS
1000.0000 mL | Freq: Once | INTRAVENOUS | Status: AC
  Administered 2019-04-05: 1000 mL via INTRAVENOUS

## 2019-04-05 MED ORDER — IOHEXOL 9 MG/ML PO SOLN
500.0000 mL | Freq: Two times a day (BID) | ORAL | Status: DC | PRN
  Administered 2019-04-05 (×2): 500 mL via ORAL
  Filled 2019-04-05 (×2): qty 500

## 2019-04-05 MED ORDER — DIAZEPAM 5 MG PO TABS
5.0000 mg | ORAL_TABLET | Freq: Once | ORAL | Status: AC
  Administered 2019-04-05: 14:00:00 5 mg via ORAL
  Filled 2019-04-05: qty 1

## 2019-04-05 MED ORDER — MECLIZINE HCL 12.5 MG PO TABS: 12.5000 mg | ORAL_TABLET | Freq: Three times a day (TID) | ORAL | 0 refills | Status: DC | PRN

## 2019-04-05 MED ORDER — MECLIZINE HCL 25 MG PO TABS
25.0000 mg | ORAL_TABLET | Freq: Once | ORAL | Status: AC
  Administered 2019-04-05: 14:00:00 25 mg via ORAL
  Filled 2019-04-05: qty 1

## 2019-04-05 MED ORDER — SODIUM CHLORIDE 0.9 % IV BOLUS: 1000.0000 mL | Freq: Once | INTRAVENOUS | Status: DC

## 2019-04-05 MED ORDER — DIAZEPAM 2 MG PO TABS: 2.0000 mg | ORAL_TABLET | Freq: Two times a day (BID) | ORAL | 0 refills | Status: DC | PRN

## 2019-04-05 MED ORDER — SODIUM CHLORIDE 0.9% FLUSH: 3.0000 mL | Freq: Once | INTRAVENOUS | Status: DC

## 2019-04-05 NOTE — ED Notes (Signed)
Minimal amt of blood obtained in green tube, pt difficult stick and did not want to be stuck a 3rd time for lab work.  Sent tube with blood obtained.

## 2019-04-05 NOTE — ED Provider Notes (Signed)
[facility] Emergency Department Provider Note   ____________________________________________   First MD Initiated Contact with Patient 04/05/19 1310     (approximate)  I have reviewed the triage vital signs and the nursing notes.   HISTORY  Chief Complaint Dizziness and Nausea   HPI Dylan Goodman is a 68 y.o. male all with a history of frequent diarrhea nausea and vomiting and both lightheadedness and vertigo comes in complaining today of vomiting with lightheadedness and more vertigo when he gets to the emergency room.  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 ([place]) 05/2008   Chronic depression 09/03/2018   CKD (chronic kidney disease) 09/03/2018   Coronary atherosclerosis 09/03/2018   Coronary bypass graft mechanical complication [id number]   Diabetes mellitus without complication ([place])    Gastric ulcer with hemorrhage 09/03/2018   Gastritis 09/03/2018   GERD (gastroesophageal reflux disease) 09/03/2018   Heart disease    Heart murmur    IDA (iron deficiency anemia) 09/03/2018   Low magnesium levels 09/03/2018   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   Myocardial infarction [facility]) 2017   Personal history of radiation therapy 05/30/2018   39 treatments with chemotherapy nasopharyngeal cancer   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    Patient Active Problem List   Diagnosis Date Noted   Dizziness 03/12/2019   Atherosclerosis of aorta ([place]) 01/29/2019   Type 2 diabetes mellitus with stage 4 chronic kidney disease, with long-term current use of insulin ([place]) 09/13/2018   Hyperlipidemia, mixed 09/13/2018   Senile purpura ([place]) 09/08/2018   Morbid obesity ([place]) 09/08/2018   Chronic  diarrhea of unknown origin 09/04/2018   Menetrier's disease (hyperplastic hypersecretory gastropathy) 09/04/2018   Chronic kidney disease 09/03/2018   History of gastric ulcer 09/03/2018   Gastritis 09/03/2018   GERD (gastroesophageal reflux disease) 09/03/2018   IDA (iron deficiency anemia) 09/03/2018   Low magnesium levels 09/03/2018   History of cancer chemotherapy 30/03/2329   Chronic systolic CHF (congestive heart failure), NYHA class 2 ([place]) 09/02/2018   Atherosclerosis of native arteries of extremity with intermittent claudication ([place]) 07/21/2018   Benign essential HTN 07/21/2018   Bilateral carotid artery stenosis 07/21/2018   Coronary artery disease involving coronary bypass graft of native heart 07/21/2018    Past Surgical History:  Procedure Laterality Date   CAROTID ENDARTERECTOMY  2006   COLONOSCOPY WITH PROPOFOL N/A 03/11/2019   Procedure: COLONOSCOPY WITH PROPOFOL;  Surgeon: Toledo, Benay Pike, MD;  Location: [facility] ENDOSCOPY;  Service: Gastroenterology;  Laterality: N/A;   CORONARY ARTERY BYPASS GRAFT  1996   quadruple bypass   ESOPHAGOGASTRODUODENOSCOPY (EGD) WITH PROPOFOL N/A 03/11/2019   Procedure: ESOPHAGOGASTRODUODENOSCOPY (EGD) WITH PROPOFOL;  Surgeon: Toledo, Benay Pike, MD;  Location: [facility] ENDOSCOPY;  Service: Gastroenterology;  Laterality: N/A;   heart stent  10/2010   x3   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   11 coils   left leg stents  08/2009   LOWER EXTREMITY ANGIOGRAPHY Left 11/11/2018   Procedure: LOWER EXTREMITY ANGIOGRAPHY;  Surgeon: Katha Cabal, MD;  Location: [place] [facility];  Service: Cardiovascular;  Laterality: Left;   REVISION OF AORTA BIFEMORAL BYPASS     right femo pop bypass  06/1998   11/2001, 08/2003  right leg stent  07/2009    Prior to Admission medications   Medication Sig Start Date End Date Taking? Authorizing Provider  budesonide-formoterol (SYMBICORT) 160-4.5 MCG/ACT inhaler Inhale 2 puffs into the  lungs 2 (two) times daily. Rinse mouth after use Patient not taking: Reported on 04/01/2019 03/17/19   Laverle Hobby, MD  carvedilol (COREG) 6.25 MG tablet Take 6.25 mg by mouth 2 (two) times daily.       Cholecalciferol (VITAMIN D3) 25 MCG (1000 UT) CAPS Take by mouth.    [provider]  cilostazol (PLETAL) 100 MG tablet Take 100 mg by mouth 2 (two) times daily.     [provider]  diazepam (VALIUM) 2 MG tablet Take 1 tablet (2 mg total) by mouth every 12 (twelve) hours as needed (vertigo). 04/05/19 04/04/20  Nena Polio, MD  ELIQUIS 2.5 MG TABS tablet Take 2.5 mg by mouth 2 (two) times daily.  08/28/18   [provider]  ezetimibe (ZETIA) 10 MG tablet Take 10 mg by mouth daily.  07/07/18   [provider]  fenofibrate 160 MG tablet Take 160 mg by mouth daily.     [provider]  Insulin Degludec (TRESIBA FLEXTOUCH) 200 UNIT/ML SOPN Inject 12 Units into the skin at bedtime.  05/17/18   [provider]  isosorbide mononitrate (IMDUR) 120 MG 24 hr tablet Take 120 mg by mouth daily.    [provider]  lansoprazole (PREVACID) 30 MG capsule Take 30 mg by mouth every morning.     [provider]  losartan (COZAAR) 25 MG tablet Take 25 mg by mouth daily.  08/29/18   [provider]  lovastatin (MEVACOR) 40 MG tablet Take 40 mg by mouth 2 (two) times daily.  07/01/18   [provider]  meclizine (ANTIVERT) 12.5 MG tablet Take 1 tablet (12.5 mg total) by mouth 3 (three) times daily as needed for dizziness. 04/05/19   Nena Polio, MD  meclizine (ANTIVERT) 25 MG tablet  03/17/19   [provider]  nitroGLYCERIN (NITROSTAT) 0.4 MG SL tablet Place 0.4 mg under the tongue every 5 (five) minutes as needed for chest pain.  07/01/18   [provider]  sodium bicarbonate 650 MG tablet Take 1 tablet by mouth daily.  12/31/18   Kolluru, Lurena Nida, MD  ticagrelor (BRILINTA) 60 MG TABS tablet Take 60 mg by mouth  2 (two) times daily.     [provider]    Allergies Patient has no known allergies.  Family History  Problem Relation Age of Onset   Coronary artery disease Mother    Alcohol abuse Father    Colon cancer Father    Coronary artery disease Father    Kidney cancer Father    Breast cancer Sister     Social History Social History   Tobacco Use   Smoking status: Former Smoker    Packs/day: 1.50    Years: 50.00    Pack years: 75.00    Types: Cigarettes    Quit date: 02/18/2017    Years since quitting: 2.1   Smokeless tobacco: Never Used  Substance Use Topics   Alcohol use: Not Currently   Drug use: Never    Review of Systems  Constitutional: No fever/chills Eyes: No visual changes. ENT: No sore throat. Cardiovascular: Denies chest pain. Respiratory: Denies shortness of breath. Gastrointestinal: No abdominal pain.   nausea,  vomiting.   diarrhea.  No constipation. Genitourinary: Negative for dysuria. Musculoskeletal: Negative for back  pain. Skin: Negative for rash. Neurological: Negative for headaches, focal weakness  ____________________________________________   PHYSICAL EXAM:  VITAL SIGNS: ED Triage Vitals  Enc Vitals Group     BP 04/05/19 1211 (!) 158/92     Pulse Rate 04/05/19 1211 95     Resp 04/05/19 1211 20     Temp 04/05/19 1211 97.7 F (36.5 C)     Temp Source 04/05/19 1211 Oral     SpO2 04/05/19 1211 96 %     Weight 04/05/19 1212 215 lb (97.5 kg)     Height 04/05/19 1212 6' (1.829 m)     Head Circumference --      Peak Flow --      Pain Score 04/05/19 1212 0     Pain Loc --      Pain Edu? --      Excl. in [place]? --     Constitutional: Alert and oriented. Ill appearing and in  distress. Eyes: Conjunctivae are normal. PERRL. EOMI. no papilledema on funduscopic Head: Atraumatic. Nose: No congestion/rhinnorhea. Mouth/Throat: Mucous membranes are moist.  Oropharynx non-erythematous. Neck: No stridor.  Cardiovascular: Normal  rate, regular rhythm. Grossly normal heart sounds.  Good peripheral circulation. Respiratory: Normal respiratory effort.  No retractions. Lungs CTAB. Gastrointestinal: Soft and nontender. No distention. No abdominal bruits. No CVA tenderness. Musculoskeletal: No lower extremity tenderness  Neurologic:  Normal speech and language. No gross focal neurologic deficits are appreciated.  Skin:  Skin is warm, dry and intact. No rash noted. .  ____________________________________________   LABS (all labs ordered are listed, but only abnormal results are displayed)  Labs Reviewed  BASIC METABOLIC PANEL - Abnormal; Notable for the following components:      Result Value   CO2 14 (*)    Glucose, Bld 106 (*)    BUN 33 (*)    Creatinine, Ser 2.09 (*)    Calcium 6.5 (*)    GFR calc non Af Amer 32 (*)    GFR calc Af Amer 37 (*)    All other components within normal limits  URINALYSIS, COMPLETE (UACMP) WITH MICROSCOPIC - Abnormal; Notable for the following components:   Color, Urine YELLOW (*)    APPearance CLEAR (*)    Hgb urine dipstick MODERATE (*)    Protein, ur 100 (*)    All other components within normal limits  TROPONIN I (HIGH SENSITIVITY) - Abnormal; Notable for the following components:   Troponin I (High Sensitivity) 21 (*)    All other components within normal limits  TROPONIN I (HIGH SENSITIVITY) - Abnormal; Notable for the following components:   Troponin I (High Sensitivity) 24 (*)    All other components within normal limits  CBC  CBG MONITORING, ED   ____________________________________________  EKG  EKG read interpreted by me shows A. fib at a rate of 103 with normal axis no acute ST-T changes A. fib is old. ____________________________________________  RADIOLOGY  ED MD interpretation:   Official radiology report(s): Ct Abdomen Pelvis W Contrast  Result Date: 04/05/2019 CLINICAL DATA:  Nausea and vomiting EXAM: CT ABDOMEN AND PELVIS WITH CONTRAST TECHNIQUE:  Multidetector CT imaging of the abdomen and pelvis was performed using the standard protocol following bolus administration of intravenous contrast. CONTRAST:  56mL OMNIPAQUE IOHEXOL 300 MG/ML  SOLN COMPARISON:  03/12/2019 FINDINGS: Lower chest: No acute abnormality. Hepatobiliary: Stable 1.9 cm cyst at the right hepatic dome. Liver otherwise unremarkable. Gallbladder unremarkable. No intrahepatic biliary dilatation. Pancreas: Unremarkable. No pancreatic ductal dilatation or surrounding  inflammatory changes. Spleen: Normal in size without focal abnormality. Adrenals/Urinary Tract: Unremarkable adrenal glands. Mild prominence of the bilateral renal pelvises without hydronephrosis. Somewhat irregular nodular appearance within the midpole of the right kidney (series 5, image 78). Urinary bladder appears unremarkable. Stomach/Bowel: Marked diffuse gastric wall thickening. Small bowel is well opacified with enteric contrast. No dilated loops. Extensive sigmoid diverticulosis. No focal colonic wall thickening or pericolonic inflammatory changes. Vascular/Lymphatic: Redemonstration of saccular aneurysm chest distal to the take-off of the renal arteries measuring 3.8 cm (series 5, image 68), unchanged. Fusiform infrarenal abdominal aortic aneurysm measuring approximately 3.8 cm, also unchanged. Metallic streak artifact from intravascular coils within the left hemipelvis. Extensive aortoiliac atherosclerosis. No abdominopelvic lymphadenopathy. Reproductive: Similar prostatomegaly. Other: No abdominal wall hernia or abnormality. No abdominopelvic ascites. Musculoskeletal: No acute or significant osseous findings. IMPRESSION: 1. 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 2. Extensive colonic diverticulosis without evidence of acute diverticulitis. 3. Subtle contour abnormality of the mid pole  of the right kidney, which may represent a prominent renal lobulation. An underlying mass in this location is difficult to exclude. Correlation with nonemergent renal ultrasound is recommended. 4. 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 Recommend followup by ultrasound in 2 years. This recommendation follows ACR consensus guidelines: White Paper of the ACR Incidental Findings Committee II on Vascular Findings. J Am Coll Radiol 2013; 10:789-794. Electronically Signed   By: Davina Poke M.D.   On: 04/05/2019 16:31    ____________________________________________   PROCEDURES  Procedure(s) performed (including Critical Care):  Procedures   ____________________________________________   INITIAL IMPRESSION / ASSESSMENT AND PLAN / ED COURSE  Patient also begins having a panic attack while in emergency room.  Give him some Valium and Antivert by mouth that should help with the panic and the vertigo.  We are giving him some fluids.  He probably is dehydrated with the vomiting he has been doing as well as the diarrhea.  Wife reports he was to get a CT scan but has not been scheduled yet we can do that now also we will follow waiting for him vertigo to see if it resolves.  His creatinine and GFR are borderline but he is slightly above the cut off so we will give him fluids and use low-dose contrast.    Dylan Goodman was evaluated in Emergency Department on 04/05/2019 for the symptoms described in the history of present illness. He was evaluated in the context of the global COVID-19 pandemic, which necessitated consideration that the patient might be at risk for infection with the SARS-CoV-2 virus that causes COVID-19. Institutional protocols and algorithms that pertain to the evaluation of patients at risk for COVID-19 are in a state of rapid change based on information released by regulatory bodies including the CDC and federal and state  organizations. These policies and algorithms were followed during the patient's care in the ED.    ----------------------------------------- 6:27 PM on 04/05/2019 -----------------------------------------  Patient feeling much better ready to go but I want to get his second troponin back to make sure that is okay.  ----------------------------------------- 8:51 PM on 04/05/2019 -----------------------------------------  Second troponin comes back it is only minimally different from the first 2 and I will let him go.  He has had no symptoms for several hours now is walking with any difficulty.  I will give him Valium and Antivert if the symptoms return  with instructions to return himself if this does not immediately help or if anything else turns up.  I will have him follow-up with his doctors.Dylan Goodman   Dylan Goodman was evaluated in Emergency Department on 04/05/2019 for the symptoms described in the history of present illness. He was evaluated in the context of the global COVID-19 pandemic, which necessitated consideration that the patient might be at risk for infection with the SARS-CoV-2 virus that causes COVID-19. Institutional protocols and algorithms that pertain to the evaluation of patients at risk for COVID-19 are in a state of rapid change based on information released by regulatory bodies including the CDC and federal and state organizations. These policies and algorithms were followed during the patient's care in the ED. ____________________________________________   FINAL CLINICAL IMPRESSION(S) / ED DIAGNOSES  Final diagnoses:  Non-intractable vomiting with nausea, unspecified vomiting type  Vertigo     ED Discharge Orders         Ordered    diazepam (VALIUM) 2 MG tablet  Every 12 hours PRN     04/05/19 2050    meclizine (ANTIVERT) 12.5 MG tablet  3 times daily PRN     04/05/19 2050           Note:  This document was prepared using Dragon voice recognition software  and may include unintentional dictation errors.    Nena Polio, MD 04/05/19 2052

## 2019-04-05 NOTE — ED Notes (Signed)
Pt given urinal for sample 

## 2019-04-05 NOTE — ED Notes (Signed)
Lab had called for redraw on the add on troponin. Attempted to draw from iv placed by iv team with no success. Lab will send a phlebotomist

## 2019-04-05 NOTE — ED Notes (Signed)
Called lab to remind them we need phlebotomy draw

## 2019-04-05 NOTE — ED Notes (Signed)
Called lab for phlebotomy drawn on troponin

## 2019-04-05 NOTE — Discharge Instructions (Addendum)
Please continue to follow-up with your doctor.  For now if the vertigo returns you can use the Valium 1 twice a day and the Antivert 1 pill 2 or 3 times a day as well.  Be careful both of these can make you woozy.  Do not fall and do not drive on them.  Please return for any worsening or if the symptoms come back or not immediately controlled by the medicine.

## 2019-04-05 NOTE — ED Triage Notes (Signed)
Pt arrived via POV with reports of dizziness since last night, pt states he also has nausea.  Pt reports the room does not feel like it is spinning at this time.  Pt reports his step-daughter that lives with him recently tested positive for COVID, he took a test and results were negative.  Pt states he was here recently for dehydration which he states he feels the same.

## 2019-04-05 NOTE — ED Notes (Signed)
Unable to establish iv - iv team consult ordered

## 2019-04-07 ENCOUNTER — Other Ambulatory Visit: Payer: Self-pay | Admitting: Gastroenterology

## 2019-04-07 ENCOUNTER — Other Ambulatory Visit (HOSPITAL_COMMUNITY): Payer: Self-pay | Admitting: Gastroenterology

## 2019-04-07 DIAGNOSIS — N281 Cyst of kidney, acquired: Secondary | ICD-10-CM

## 2019-04-07 DIAGNOSIS — R93429 Abnormal radiologic findings on diagnostic imaging of unspecified kidney: Secondary | ICD-10-CM

## 2019-04-08 LAB — CALPROTECTIN, FECAL: Calprotectin, Fecal: 73 ug/g (ref 0–120)

## 2019-04-12 ENCOUNTER — Emergency Department
Admission: EM | Admit: 2019-04-12 | Discharge: 2019-04-13 | Disposition: A | Discharge status: Other Acute Inpatient Hospital | Payer: Medicare Other | Attending: Emergency Medicine | Admitting: Emergency Medicine

## 2019-04-12 ENCOUNTER — Emergency Department: Payer: Medicare Other

## 2019-04-12 ENCOUNTER — Other Ambulatory Visit: Payer: Self-pay

## 2019-04-12 DIAGNOSIS — K2960 Other gastritis without bleeding: Secondary | ICD-10-CM

## 2019-04-12 DIAGNOSIS — Z79899 Other long term (current) drug therapy: Secondary | ICD-10-CM | POA: Insufficient documentation

## 2019-04-12 DIAGNOSIS — R42 Dizziness and giddiness: Secondary | ICD-10-CM | POA: Diagnosis present

## 2019-04-12 DIAGNOSIS — Z87891 Personal history of nicotine dependence: Secondary | ICD-10-CM | POA: Insufficient documentation

## 2019-04-12 DIAGNOSIS — Z794 Long term (current) use of insulin: Secondary | ICD-10-CM

## 2019-04-12 DIAGNOSIS — N179 Acute kidney failure, unspecified: Secondary | ICD-10-CM | POA: Diagnosis not present

## 2019-04-12 DIAGNOSIS — Z7901 Long term (current) use of anticoagulants: Secondary | ICD-10-CM | POA: Insufficient documentation

## 2019-04-12 DIAGNOSIS — E1122 Type 2 diabetes mellitus with diabetic chronic kidney disease: Secondary | ICD-10-CM

## 2019-04-12 DIAGNOSIS — I5022 Chronic systolic (congestive) heart failure: Secondary | ICD-10-CM | POA: Insufficient documentation

## 2019-04-12 DIAGNOSIS — N184 Chronic kidney disease, stage 4 (severe): Secondary | ICD-10-CM

## 2019-04-12 DIAGNOSIS — E872 Acidosis: Secondary | ICD-10-CM

## 2019-04-12 DIAGNOSIS — I130 Hypertensive heart and chronic kidney disease with heart failure and stage 1 through stage 4 chronic kidney disease, or unspecified chronic kidney disease: Secondary | ICD-10-CM | POA: Insufficient documentation

## 2019-04-12 DIAGNOSIS — U071 COVID-19: Secondary | ICD-10-CM | POA: Insufficient documentation

## 2019-04-12 DIAGNOSIS — Z951 Presence of aortocoronary bypass graft: Secondary | ICD-10-CM | POA: Insufficient documentation

## 2019-04-12 DIAGNOSIS — I25708 Atherosclerosis of coronary artery bypass graft(s), unspecified, with other forms of angina pectoris: Secondary | ICD-10-CM

## 2019-04-12 DIAGNOSIS — E871 Hypo-osmolality and hyponatremia: Secondary | ICD-10-CM

## 2019-04-12 DIAGNOSIS — K529 Noninfective gastroenteritis and colitis, unspecified: Secondary | ICD-10-CM

## 2019-04-12 DIAGNOSIS — N189 Chronic kidney disease, unspecified: Secondary | ICD-10-CM | POA: Diagnosis not present

## 2019-04-12 DIAGNOSIS — E119 Type 2 diabetes mellitus without complications: Secondary | ICD-10-CM | POA: Insufficient documentation

## 2019-04-12 DIAGNOSIS — E860 Dehydration: Secondary | ICD-10-CM | POA: Diagnosis not present

## 2019-04-12 DIAGNOSIS — I4891 Unspecified atrial fibrillation: Secondary | ICD-10-CM

## 2019-04-12 LAB — HEPATIC FUNCTION PANEL
ALT: 26 U/L (ref 0–44)
AST: 50 U/L — ABNORMAL HIGH (ref 15–41)
Albumin: 3 g/dL — ABNORMAL LOW (ref 3.5–5.0)
Alkaline Phosphatase: 56 U/L (ref 38–126)
Bilirubin, Direct: 0.3 mg/dL — ABNORMAL HIGH (ref 0.0–0.2)
Indirect Bilirubin: 0.9 mg/dL (ref 0.3–0.9)
Total Bilirubin: 1.2 mg/dL (ref 0.3–1.2)
Total Protein: 6.4 g/dL — ABNORMAL LOW (ref 6.5–8.1)

## 2019-04-12 LAB — CBC
HCT: 38 % — ABNORMAL LOW (ref 39.0–52.0)
Hemoglobin: 12.4 g/dL — ABNORMAL LOW (ref 13.0–17.0)
MCH: 28.8 pg (ref 26.0–34.0)
MCHC: 32.6 g/dL (ref 30.0–36.0)
MCV: 88.4 fL (ref 80.0–100.0)
Platelets: 184 10*3/uL (ref 150–400)
RBC: 4.3 MIL/uL (ref 4.22–5.81)
RDW: 14.2 % (ref 11.5–15.5)
WBC: 5.3 10*3/uL (ref 4.0–10.5)
nRBC: 0 % (ref 0.0–0.2)

## 2019-04-12 LAB — BASIC METABOLIC PANEL
Anion gap: 16 — ABNORMAL HIGH (ref 5–15)
BUN: 94 mg/dL — ABNORMAL HIGH (ref 8–23)
CO2: 12 mmol/L — ABNORMAL LOW (ref 22–32)
Calcium: 5 mg/dL — CL (ref 8.9–10.3)
Chloride: 103 mmol/L (ref 98–111)
Creatinine, Ser: 5.93 mg/dL — ABNORMAL HIGH (ref 0.61–1.24)
GFR calc Af Amer: 11 mL/min — ABNORMAL LOW (ref >60)
GFR calc non Af Amer: 9 mL/min — ABNORMAL LOW (ref >60)
Glucose, Bld: 93 mg/dL (ref 70–99)
Potassium: 5.2 mmol/L — ABNORMAL HIGH (ref 3.5–5.1)
Sodium: 131 mmol/L — ABNORMAL LOW (ref 135–145)

## 2019-04-12 LAB — URINALYSIS, COMPLETE (UACMP) WITH MICROSCOPIC
Bacteria, UA: NONE SEEN
Bilirubin Urine: NEGATIVE
Glucose, UA: NEGATIVE mg/dL
Ketones, ur: NEGATIVE mg/dL
Leukocytes,Ua: NEGATIVE
Nitrite: NEGATIVE
Protein, ur: NEGATIVE mg/dL
Specific Gravity, Urine: 1.006 (ref 1.005–1.030)
pH: 5 (ref 5.0–8.0)

## 2019-04-12 LAB — MAGNESIUM: Magnesium: 0.6 mg/dL — CL (ref 1.7–2.4)

## 2019-04-12 LAB — LACTIC ACID, PLASMA
Lactic Acid, Venous: 1 mmol/L (ref 0.5–1.9)
Lactic Acid, Venous: 0.5 mmol/L (ref 0.5–1.9)

## 2019-04-12 LAB — SARS CORONAVIRUS 2 BY RT PCR (HOSPITAL ORDER, PERFORMED IN ~~LOC~~ HOSPITAL LAB): SARS Coronavirus 2: POSITIVE — AB

## 2019-04-12 IMAGING — CR DG CHEST 1V PORT
1 series · 1 of 1 positions shown · non-contrast
Comparison: [DATE]

CLINICAL DATA: Fever and chills. Acute onset of dizziness.

EXAM:
PORTABLE CHEST 1 VIEW

[chest ap]
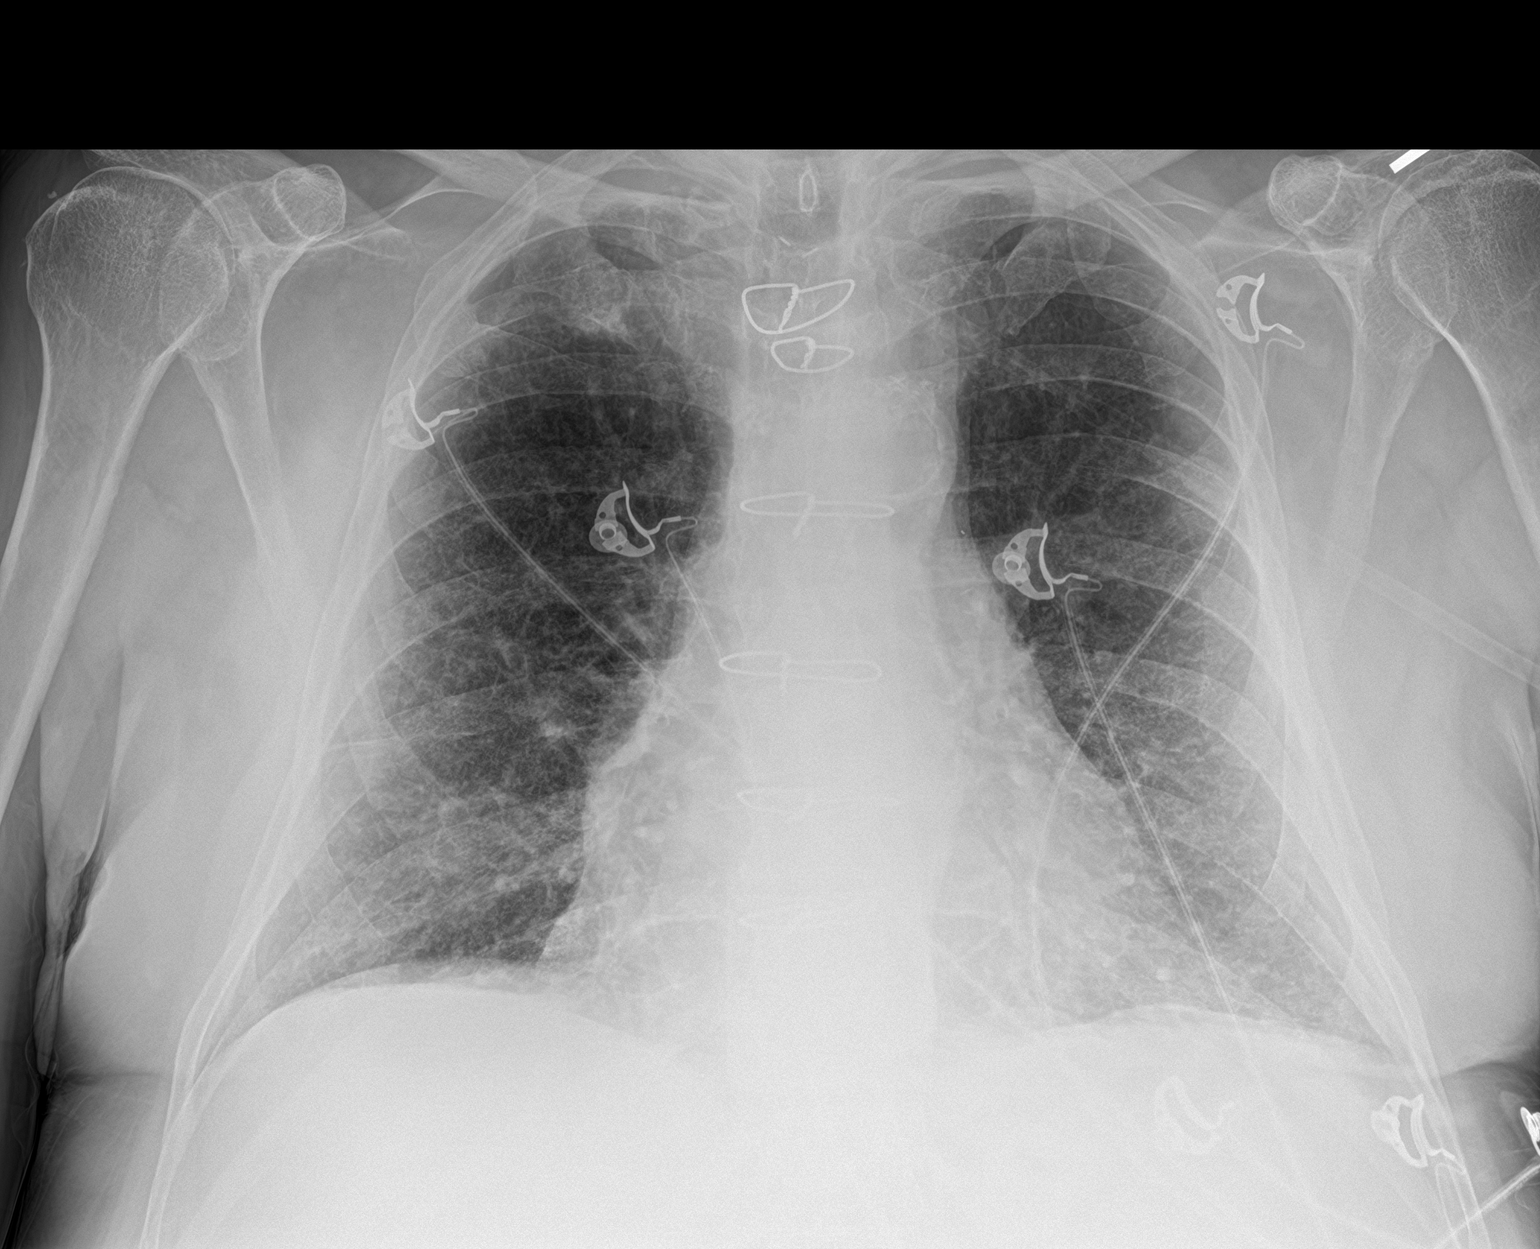

[1 of 1 positions shown; findings below may reference images not displayed]

FINDINGS: Heart size and pulmonary vascularity are normal. Aortic
atherosclerosis. Chronic accentuation of the interstitial markings.
No infiltrates or effusions. No acute bone abnormality.
IMPRESSION: No acute cardiopulmonary abnormality. Chronic interstitial lung
disease. Aortic atherosclerosis.

## 2019-04-12 IMAGING — CT CT HEAD W/O CM
3 series · 16 of 46 positions shown, 19 images · non-contrast
Comparison: MRI of the brain dated [DATE]

CLINICAL DATA: Ataxia. Sudden onset of dizziness today.

EXAM:
CT HEAD WITHOUT CONTRAST
TECHNIQUE: Contiguous axial images were obtained from the base of the skull
through the vertex without intravenous contrast.

[Series 2: head wo · axial · 0.47mm/px · z∈[-140,-20]mm · 10 of 29 slices shown, 13 images]
[im 3/29  brain]
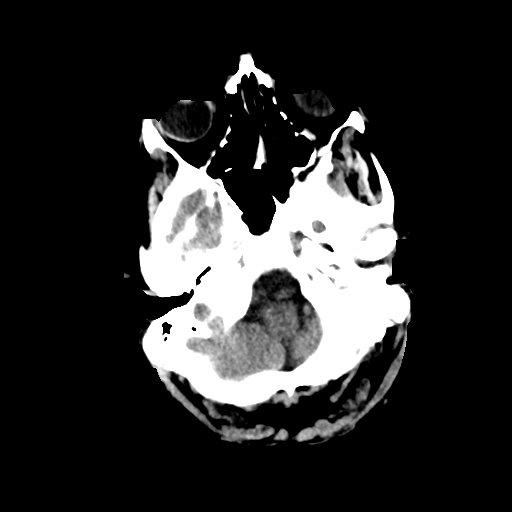
[im 3/29  bone]
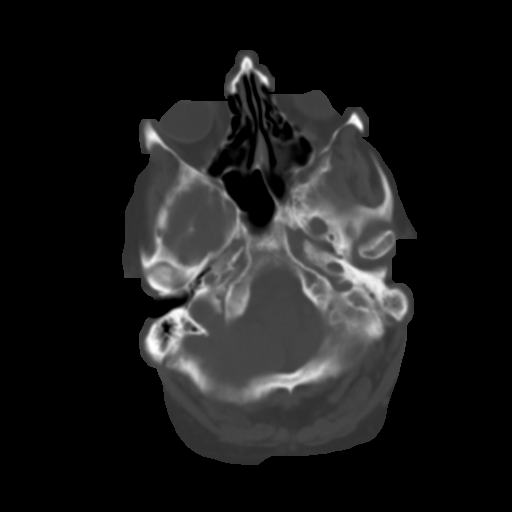
[im 6/29  brain]
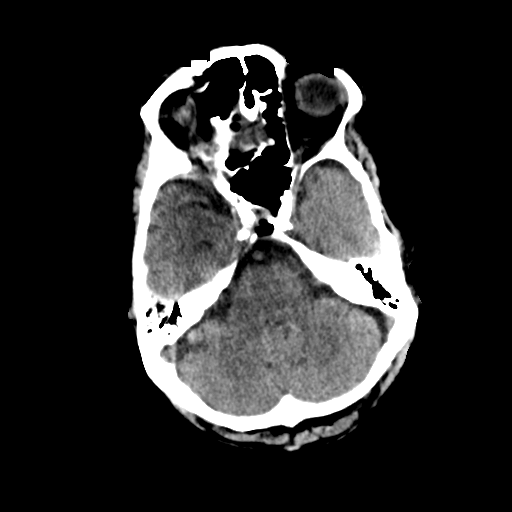
[im 8/29  brain]
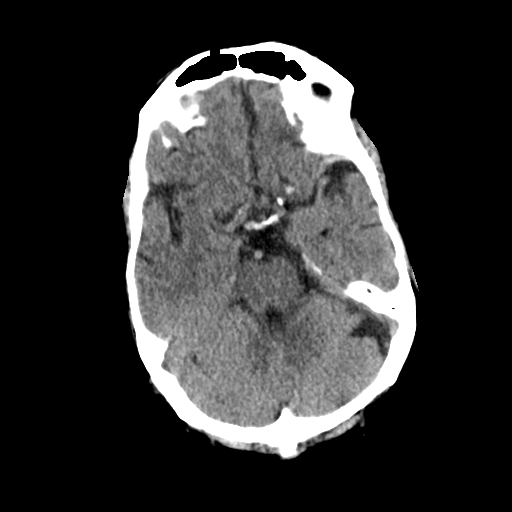
[im 11/29  brain]
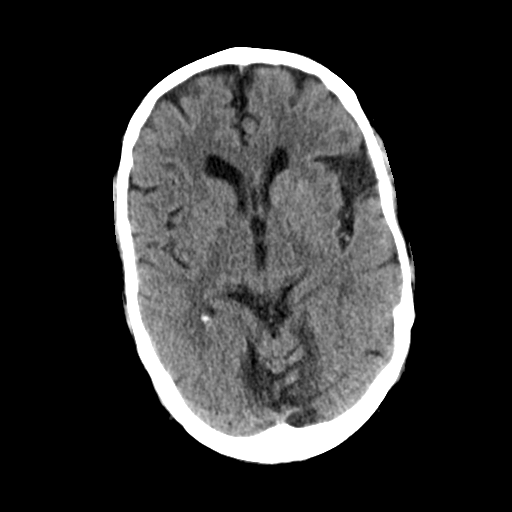
[im 14/29  brain]
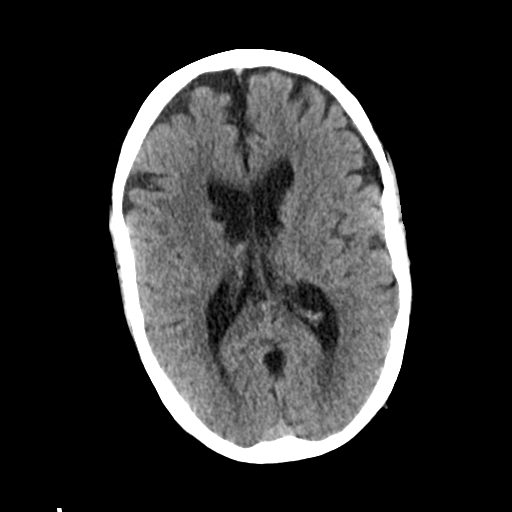
[im 14/29  bone]
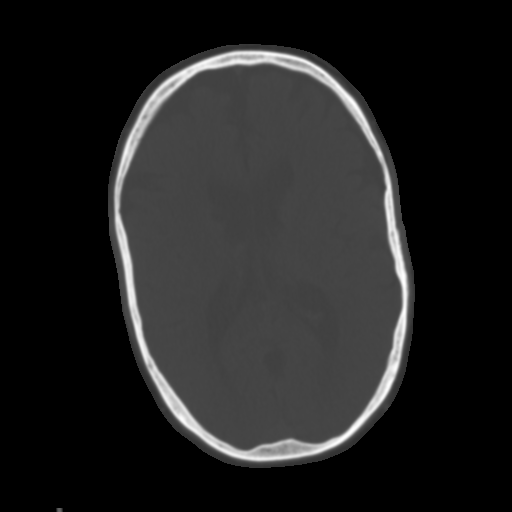
[im 16/29  brain]
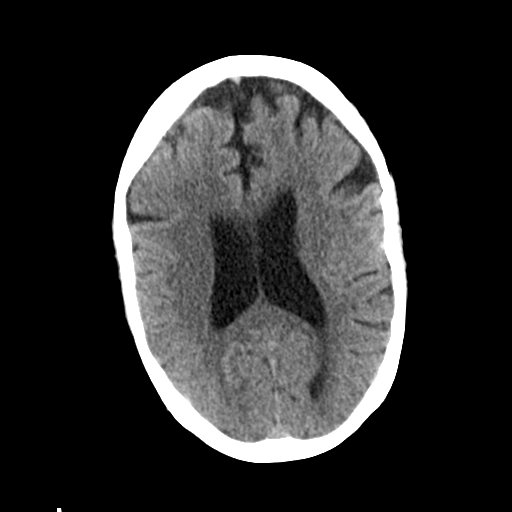
[im 19/29  brain]
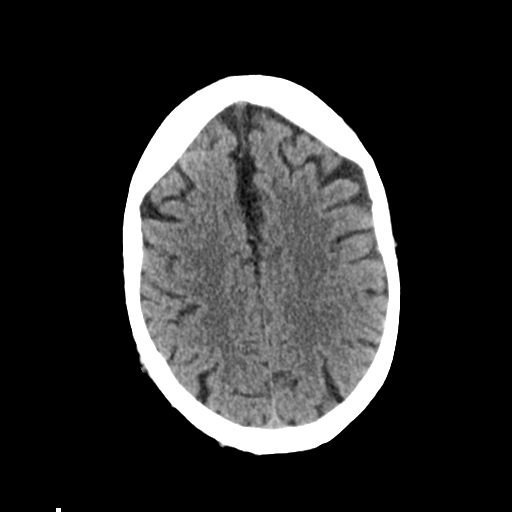
[im 22/29  brain]
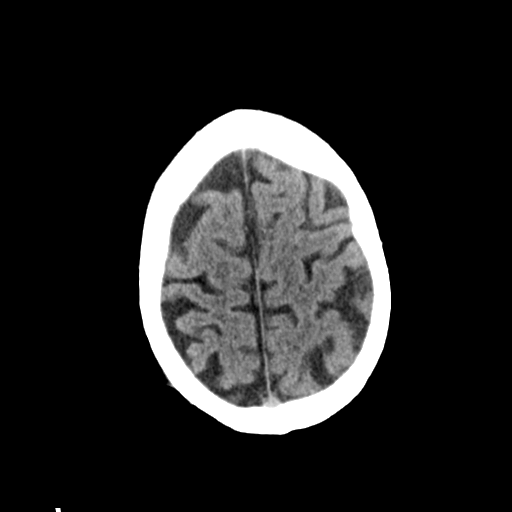
[im 24/29  brain]
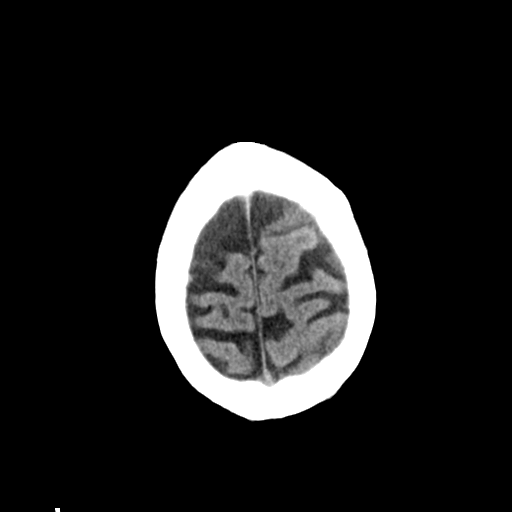
[im 24/29  bone]
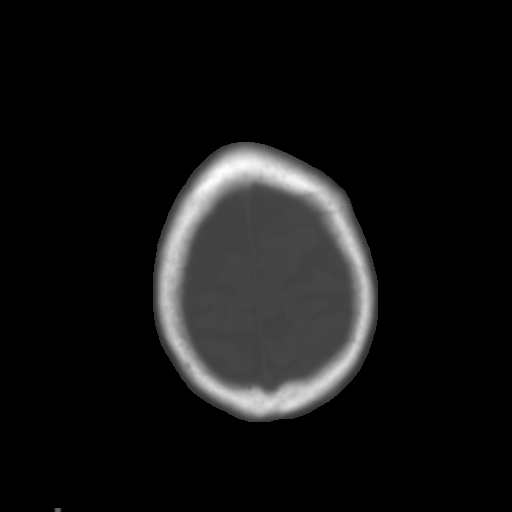
[im 27/29  brain]
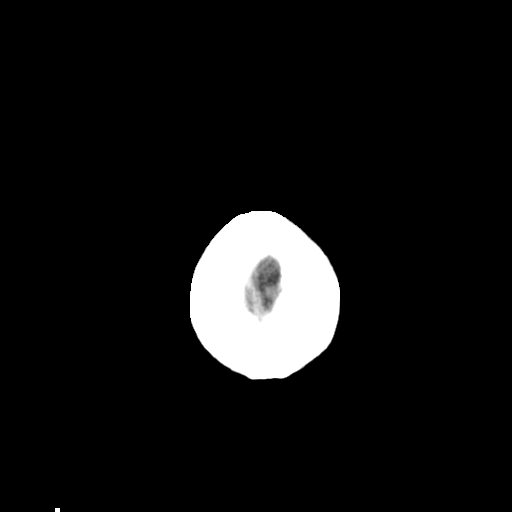

[Series 4: coronal soft tissue · coronal · 0.30mm/px · 3 of 67 slices shown]
[im 23/67  brain]
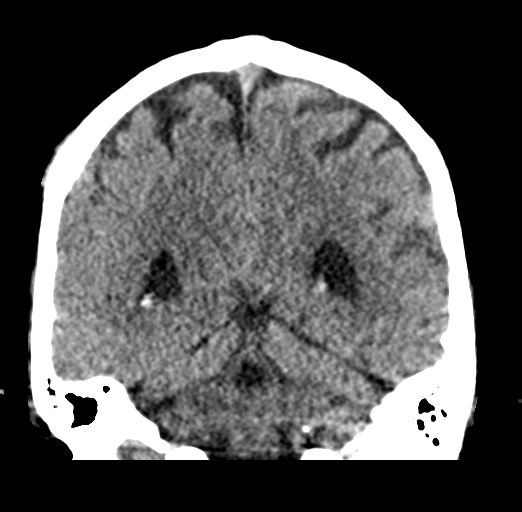
[im 30/67  brain]
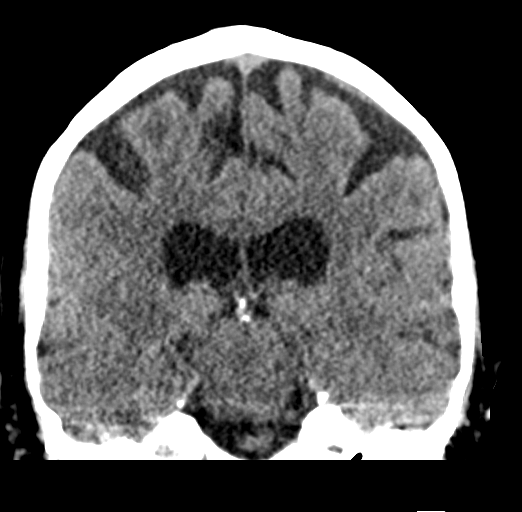
[im 37/67  brain]
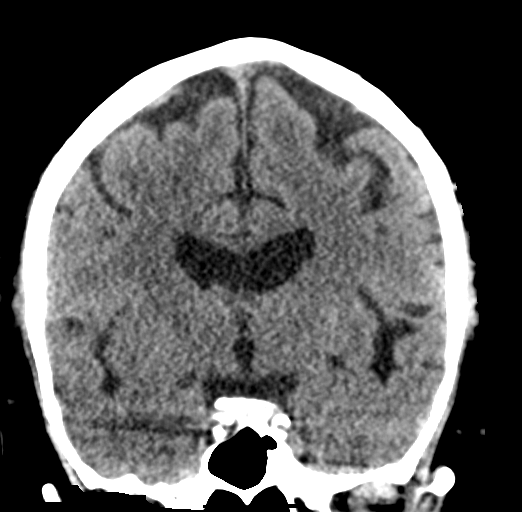

[Series 5: sagittal soft tissue · sagittal · 0.30mm/px · 3 of 49 slices shown]
[im 17/49  brain]
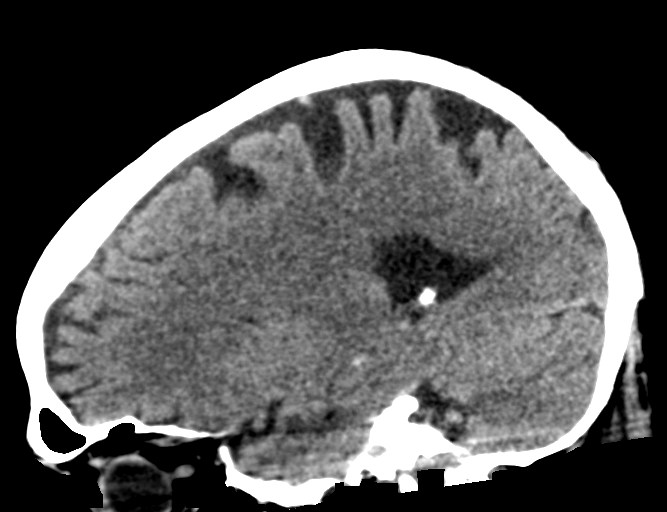
[im 25/49  brain]
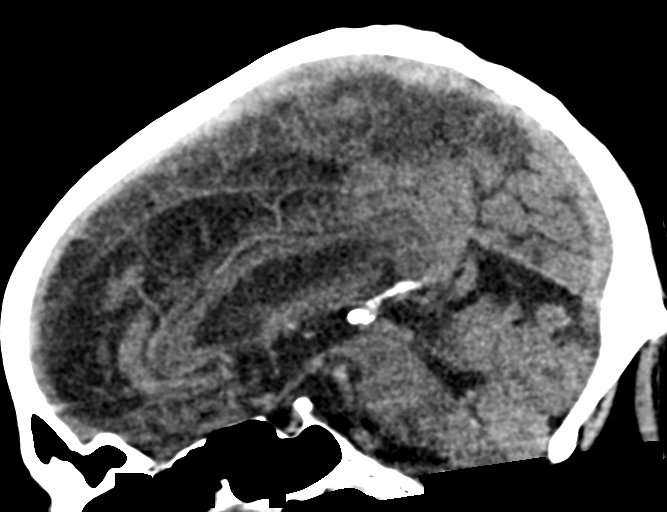
[im 33/49  brain]
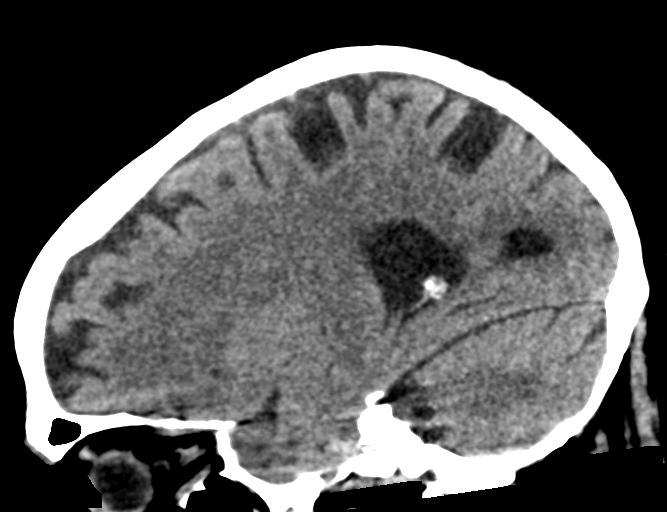

[16 of 46 positions shown; findings below may reference images not displayed]

FINDINGS: Brain: No evidence of acute infarction, hemorrhage, hydrocephalus,
extra-axial collection or mass lesion/mass effect. Mild diffuse
atrophy.

Vascular: No hyperdense vessel. Dense calcification in the distal
vertebral arteries.

Skull: Normal. Negative for fracture or focal lesion.

Sinuses/Orbits: No acute finding.

Other: None
IMPRESSION: No acute abnormalities. Mild diffuse atrophy. Dense calcification in
the distal vertebral arteries.

## 2019-04-12 MED ORDER — VANCOMYCIN HCL IN DEXTROSE 1-5 GM/200ML-% IV SOLN: 1000.0000 mg | Freq: Once | INTRAVENOUS | Status: DC

## 2019-04-12 MED ORDER — CALCIUM GLUCONATE-NACL 1-0.675 GM/50ML-% IV SOLN
1.0000 g | Freq: Once | INTRAVENOUS | Status: AC
  Administered 2019-04-12 (×2): 1000 mg via INTRAVENOUS
  Filled 2019-04-12: qty 50

## 2019-04-12 MED ORDER — SODIUM BICARBONATE-DEXTROSE 150-5 MEQ/L-% IV SOLN
150.0000 meq | INTRAVENOUS | Status: DC
  Administered 2019-04-12: 150 meq via INTRAVENOUS
  Filled 2019-04-12 (×3): qty 1000

## 2019-04-12 MED ORDER — SODIUM CHLORIDE 0.9 % IV SOLN: 1.0000 g | Freq: Once | INTRAVENOUS | Status: DC

## 2019-04-12 MED ORDER — ACETAMINOPHEN 500 MG PO TABS
1000.0000 mg | ORAL_TABLET | Freq: Once | ORAL | Status: AC
  Administered 2019-04-12: 19:00:00 1000 mg via ORAL
  Filled 2019-04-12: qty 2

## 2019-04-12 MED ORDER — VANCOMYCIN HCL 10 G IV SOLR
2000.0000 mg | Freq: Once | INTRAVENOUS | Status: AC
  Administered 2019-04-12: 20:00:00 2000 mg via INTRAVENOUS
  Filled 2019-04-12: qty 2000

## 2019-04-12 MED ORDER — CALCIUM GLUCONATE-NACL 1-0.675 GM/50ML-% IV SOLN
1.0000 g | Freq: Once | INTRAVENOUS | Status: AC
  Administered 2019-04-12: 19:00:00 1000 mg via INTRAVENOUS
  Filled 2019-04-12: qty 50

## 2019-04-12 MED ORDER — SODIUM CHLORIDE 0.9 % IV SOLN
2.0000 g | Freq: Once | INTRAVENOUS | Status: AC
  Administered 2019-04-12: 2 g via INTRAVENOUS
  Filled 2019-04-12: qty 2

## 2019-04-12 MED ORDER — SODIUM CHLORIDE 0.9 % IV BOLUS
1000.0000 mL | Freq: Once | INTRAVENOUS | Status: AC
  Administered 2019-04-12: 1000 mL via INTRAVENOUS

## 2019-04-12 MED ORDER — METRONIDAZOLE IN NACL 5-0.79 MG/ML-% IV SOLN
500.0000 mg | Freq: Once | INTRAVENOUS | Status: AC
  Administered 2019-04-12: 500 mg via INTRAVENOUS
  Filled 2019-04-12: qty 100

## 2019-04-12 MED ORDER — SODIUM CHLORIDE 0.9 % IV BOLUS
750.0000 mL | Freq: Once | INTRAVENOUS | Status: AC
  Administered 2019-04-12: 750 mL via INTRAVENOUS

## 2019-04-12 MED ORDER — MAGNESIUM SULFATE 2 GM/50ML IV SOLN
2.0000 g | Freq: Once | INTRAVENOUS | Status: AC
  Administered 2019-04-12: 18:00:00 2 g via INTRAVENOUS
  Filled 2019-04-12: qty 50

## 2019-04-12 NOTE — Consult Note (Addendum)
CODE SEPSIS - PHARMACY COMMUNICATION  **Broad Spectrum Antibiotics should be administered within 1 hour of Sepsis diagnosis**  Time Code Sepsis Called/Page Received: 1831  Antibiotics Ordered: Cefepime/Vancomycin  Time of 1st antibiotic administration: 2008  Additional action taken by pharmacy: spoke with Nurse Gershon Mussel  If necessary, Name of Provider/Nurse Contacted: Lenoard Aden ,PharmD Clinical Pharmacist  04/12/2019  6:32 PM

## 2019-04-12 NOTE — ED Notes (Signed)
Pt taken to CT.

## 2019-04-12 NOTE — ED Notes (Signed)
Received call from pharmacy asking why antibiotics have not yet been started. Pt has 3 IV access points which are currently running calcium, magnesium and sodium bicarb. It was not possible to establish a fourth access point. Antibiotics will be run when one of his other infusions are complete.

## 2019-04-12 NOTE — ED Notes (Signed)
EMS CBG 95. Was a fib on monitor. Initial BP with EMS was 80 palp, then returned above 100 en route.

## 2019-04-12 NOTE — ED Triage Notes (Signed)
Pt arrives to ED via [facility] for sudden onset dizziness about an hour ago. States has happened a few times before in the recent weeks. Denies SOB but is mouth breathing, tachypnic, was 88% on RA. Placed on 2 L [place]. Denies SOB or cough. Afebrile. States 2 COVID + people living in his house. States he tested about 13 days ago and was negative. Talking in complete sentences. No pain. Only complaint is that he is cold, shaking. Given 2 warm blankets.

## 2019-04-12 NOTE — ED Provider Notes (Signed)
[facility] Emergency Department Provider Note  ____________________________________________   First MD Initiated Contact with Patient 04/12/19 1652     (approximate)  I have reviewed the triage vital signs and the nursing notes.   HISTORY  Chief Complaint Dizziness    HPI Dylan Goodman is a 68 y.o. 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  The patient's primary complaint is dizziness and lightheadedness.  Is been seen multiple times for this recently, and has a history of chronic diarrhea with chronic hypovolemia.  He states that over the last several days, his diarrhea has been worsening.  He states that earlier today, he began to feel very weak and began to have chills and rigors.  He felt lightheaded, which initially describes as dizziness but states the room was not spinning, and he more so felt like he was going to pass out.  He has not had any dizziness, difficulty speaking or swallowing, or focal numbness or weakness.  He states he feels generally weak.  Of note, he is currently living with 2 other people who are known COVID positive.  He was tested several weeks ago and was negative, but has since had some cough and shortness of breath as well.   Denies any chest pain.  No overt abdominal pain.  His diarrhea is similar to his chronic diarrhea.       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 ([place]) 05/2008   Chronic depression 09/03/2018   CKD (chronic kidney disease) 09/03/2018   Coronary atherosclerosis 09/03/2018   Coronary bypass graft mechanical complication [id number]/5397   Diabetes mellitus without complication ([place])    Gastric ulcer with hemorrhage 09/03/2018   Gastritis 09/03/2018   GERD  (gastroesophageal reflux disease) 09/03/2018   Heart disease    Heart murmur    IDA (iron deficiency anemia) 09/03/2018   Low magnesium levels 09/03/2018   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   Myocardial infarction [facility]) 2017   Personal history of radiation therapy 05/30/2018   39 treatments with chemotherapy nasopharyngeal cancer   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    Patient Active Problem List   Diagnosis Date Noted   Dizziness 03/12/2019   Atherosclerosis of aorta ([place]) 01/29/2019   Type 2 diabetes mellitus with stage 4 chronic kidney disease, with long-term current use of insulin ([place]) 09/13/2018   Hyperlipidemia, mixed 09/13/2018   Senile purpura ([place]) 09/08/2018   Morbid obesity ([place]) 09/08/2018   Chronic diarrhea of unknown origin 09/04/2018   Menetrier's disease (hyperplastic hypersecretory gastropathy) 09/04/2018   Chronic kidney disease 09/03/2018   History of gastric ulcer 09/03/2018   Gastritis 09/03/2018   GERD (gastroesophageal reflux disease) 09/03/2018   IDA (iron deficiency anemia) 09/03/2018   Low magnesium levels 09/03/2018   History of cancer chemotherapy 67/34/1937   Chronic systolic CHF (congestive heart failure), NYHA class 2 ([place]) 09/02/2018   Atherosclerosis of native arteries of extremity with intermittent claudication ([place]) 07/21/2018   Benign essential HTN 07/21/2018   Bilateral carotid artery stenosis 07/21/2018   Coronary artery disease involving coronary bypass graft of native heart 07/21/2018    Past Surgical History:  Procedure Laterality Date   CAROTID ENDARTERECTOMY  2006   COLONOSCOPY WITH PROPOFOL N/A 03/11/2019   Procedure: COLONOSCOPY WITH PROPOFOL;  Surgeon: Fulton, Amery  K, MD;  Location: [facility] ENDOSCOPY;  Service: Gastroenterology;  Laterality: N/A;   CORONARY ARTERY BYPASS GRAFT  1996   quadruple bypass   ESOPHAGOGASTRODUODENOSCOPY (EGD) WITH PROPOFOL N/A 03/11/2019   Procedure:  ESOPHAGOGASTRODUODENOSCOPY (EGD) WITH PROPOFOL;  Surgeon: Toledo, Benay Pike, MD;  Location: [facility] ENDOSCOPY;  Service: Gastroenterology;  Laterality: N/A;   heart stent  10/2010   x3   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 Authorizing Provider  budesonide-formoterol (SYMBICORT) 160-4.5 MCG/ACT inhaler Inhale 2 puffs into the lungs 2 (two) times daily. Rinse mouth after use Patient not taking: Reported on 04/01/2019 03/17/19   Laverle Hobby, MD  carvedilol (COREG) 6.25 MG tablet Take 6.25 mg by mouth 2 (two) times daily.       Cholecalciferol (VITAMIN D3) 25 MCG (1000 UT) CAPS Take by mouth.    [provider]  cilostazol (PLETAL) 100 MG tablet Take 100 mg by mouth 2 (two) times daily.     [provider]  diazepam (VALIUM) 2 MG tablet Take 1 tablet (2 mg total) by mouth every 12 (twelve) hours as needed (vertigo). 04/05/19 04/04/20  Nena Polio, MD  ELIQUIS 2.5 MG TABS tablet Take 2.5 mg by mouth 2 (two) times daily.  08/28/18   [provider]  ezetimibe (ZETIA) 10 MG tablet Take 10 mg by mouth daily.  07/07/18   [provider]  fenofibrate 160 MG tablet Take 160 mg by mouth daily.     [provider]  Insulin Degludec (TRESIBA FLEXTOUCH) 200 UNIT/ML SOPN Inject 12 Units into the skin at bedtime.  05/17/18   [provider]  isosorbide mononitrate (IMDUR) 120 MG 24 hr tablet Take 120 mg by mouth daily.    [provider]  lansoprazole (PREVACID) 30 MG capsule Take 30 mg by mouth every morning.      [provider]  losartan (COZAAR) 25 MG tablet Take 25 mg by mouth daily.  08/29/18   [provider]  lovastatin (MEVACOR) 40 MG tablet Take 40 mg by mouth 2 (two) times daily.  07/01/18   [provider]  meclizine (ANTIVERT) 12.5 MG tablet Take 1 tablet (12.5 mg total) by mouth 3 (three) times daily as needed for dizziness. 04/05/19   Nena Polio, MD  meclizine (ANTIVERT) 25 MG tablet  03/17/19   [provider]  nitroGLYCERIN (NITROSTAT) 0.4 MG SL tablet Place 0.4 mg under the tongue every 5 (five) minutes as needed for chest pain.  07/01/18   [provider]  sodium bicarbonate 650 MG tablet Take 1 tablet by mouth daily.  12/31/18   Kolluru, Lurena Nida, MD  ticagrelor (BRILINTA) 60 MG TABS tablet Take 60 mg by mouth 2 (two) times daily.     [provider]    Allergies Patient has no known allergies.  Family History  Problem Relation Age of Onset   Coronary artery disease Mother    Alcohol abuse Father    Colon cancer Father    Coronary artery disease  Father    Kidney cancer Father    Breast cancer Sister     Social History Social History   Tobacco Use   Smoking status: Former Smoker    Packs/day: 1.50    Years: 50.00    Pack years: 75.00    Types: Cigarettes    Quit date: 02/18/2017    Years since quitting: 2.1   Smokeless tobacco: Never Used  Substance Use Topics   Alcohol use: Not Currently   Drug use: Never    Review of Systems  Review of Systems  Constitutional: Positive for chills and fatigue. Negative for fever.  HENT: Negative for sore throat.   Respiratory: Negative for shortness of breath.   Cardiovascular: Negative for chest pain.  Gastrointestinal: Positive for diarrhea and nausea. Negative for abdominal pain.  Genitourinary: Negative for flank pain.  Musculoskeletal: Negative for neck pain.  Skin: Negative for rash and wound.  Allergic/Immunologic: Negative for immunocompromised state.    Neurological: Positive for weakness and light-headedness. Negative for numbness.  Hematological: Does not bruise/bleed easily.  All other systems reviewed and are negative.    ____________________________________________  PHYSICAL EXAM:      VITAL SIGNS: ED Triage Vitals [04/12/19 1652]  Enc Vitals Group     BP 115/64     Pulse Rate (!) 110     Resp (!) 29     Temp 98.5 F (36.9 C)     Temp Source Oral     SpO2 98 %     Weight 215 lb (97.5 kg)     Height 6' (1.829 m)     Head Circumference      Peak Flow      Pain Score 0     Pain Loc      Pain Edu?      Excl. in [place]?      Physical Exam Vitals signs and nursing note reviewed.  Constitutional:      General: He is not in acute distress.    Appearance: He is well-developed.  HENT:     Head: Normocephalic and atraumatic.  Eyes:     Conjunctiva/sclera: Conjunctivae normal.  Neck:     Musculoskeletal: Neck supple.  Cardiovascular:     Rate and Rhythm: Normal rate and regular rhythm.     Heart sounds: Normal heart sounds. No murmur. No friction rub.  Pulmonary:     Effort: Pulmonary effort is normal. No respiratory distress.     Breath sounds: Normal breath sounds. No wheezing or rales.  Abdominal:     General: There is no distension.     Palpations: Abdomen is soft.     Tenderness: There is no abdominal tenderness.  Skin:    General: Skin is warm.     Capillary Refill: Capillary refill takes less than 2 seconds.  Neurological:     Mental Status: He is alert and oriented to person, place, and time.     Motor: No abnormal muscle tone.       ____________________________________________   LABS (all labs ordered are listed, but only abnormal results are displayed)  Labs Reviewed  SARS CORONAVIRUS 2 (HOSPITAL ORDER, [facility]) - Abnormal; Notable for the following components:      Result Value   SARS Coronavirus 2 POSITIVE (*)    All other components within normal limits  BASIC  METABOLIC PANEL - Abnormal; Notable for the following components:   Sodium 131 (*)    Potassium 5.2 (*)  CO2 12 (*)    BUN 94 (*)    Creatinine, Ser 5.93 (*)    Calcium 5.0 (*)    GFR calc non Af Amer 9 (*)    GFR calc Af Amer 11 (*)    Anion gap 16 (*)    All other components within normal limits  CBC - Abnormal; Notable for the following components:   Hemoglobin 12.4 (*)    HCT 38.0 (*)    All other components within normal limits  MAGNESIUM - Abnormal; Notable for the following components:   Magnesium 0.6 (*)    All other components within normal limits  HEPATIC FUNCTION PANEL - Abnormal; Notable for the following components:   Total Protein 6.4 (*)    Albumin 3.0 (*)    AST 50 (*)    Bilirubin, Direct 0.3 (*)    All other components within normal limits  CULTURE, BLOOD (ROUTINE X 2)  CULTURE, BLOOD (ROUTINE X 2)  LACTIC ACID, PLASMA  URINALYSIS, COMPLETE (UACMP) WITH MICROSCOPIC  LACTIC ACID, PLASMA  CBG MONITORING, ED    ____________________________________________  EKG: ATRIAL fibrillation, ventricular rate 97.  Low voltage.  Nonspecific T wave changes occasional PVCs.  QRS 108, QTc 473.  No acute ischemic changes. ________________________________________  RADIOLOGY All imaging, including plain films, CT scans, and ultrasounds, independently reviewed by me, and interpretations confirmed via formal radiology reads.  ED MD interpretation:   Chest x-ray: Negative CT head: Negative Reviewed CT abdomen/pelvis from 9/6 which is largely unremarkable with exception of his chronic issues  Official radiology report(s): Ct Head Wo Contrast  Result Date: 04/12/2019 CLINICAL DATA:  Ataxia. Sudden onset of dizziness today. EXAM: CT HEAD WITHOUT CONTRAST TECHNIQUE: Contiguous axial images were obtained from the base of the skull through the vertex without intravenous contrast. COMPARISON:  MRI of the brain dated 03/12/2019 FINDINGS: Brain: No evidence of acute infarction,  hemorrhage, hydrocephalus, extra-axial collection or mass lesion/mass effect. Mild diffuse atrophy. Vascular: No hyperdense vessel. Dense calcification in the distal vertebral arteries. Skull: Normal. Negative for fracture or focal lesion. Sinuses/Orbits: No acute finding. Other: None IMPRESSION: No acute abnormalities. Mild diffuse atrophy. Dense calcification in the distal vertebral arteries. Electronically Signed   By: Lorriane Shire M.D.   On: 04/12/2019 17:56   Dg Chest Portable 1 View  Result Date: 04/12/2019 CLINICAL DATA:  Fever and chills. Acute onset of dizziness. EXAM: PORTABLE CHEST 1 VIEW COMPARISON:  03/18/2019 FINDINGS: Heart size and pulmonary vascularity are normal. Aortic atherosclerosis. Chronic accentuation of the interstitial markings. No infiltrates or effusions. No acute bone abnormality. IMPRESSION: No acute cardiopulmonary abnormality. Chronic interstitial lung disease. Aortic atherosclerosis. Electronically Signed   By: Lorriane Shire M.D.   On: 04/12/2019 18:19    ____________________________________________  PROCEDURES   Procedure(s) performed (including Critical Care):  .Critical Care Performed by: Duffy Bruce, MD Authorized by: Duffy Bruce, MD   Critical care provider statement:    Critical care time (minutes):  45   Critical care time was exclusive of:  Separately billable procedures and treating other patients and teaching time   Critical care was necessary to treat or prevent imminent or life-threatening deterioration of the following conditions:  Cardiac failure, circulatory failure, respiratory failure, metabolic crisis and sepsis   Critical care was time spent personally by me on the following activities:  Development of treatment plan with patient or surrogate, discussions with consultants, evaluation of patient's response to treatment, examination of patient, obtaining history from patient or surrogate, ordering and performing  treatments and  interventions, ordering and review of laboratory studies, ordering and review of radiographic studies, pulse oximetry, re-evaluation of patient's condition and review of old charts   I assumed direction of critical care for this patient from another provider in my specialty: no      ____________________________________________  INITIAL IMPRESSION / MDM / [place] / ED COURSE  As part of my medical decision making, I reviewed the following data within the [place] Notes from prior ED visits and [place] Controlled Substance Database      *Dylan Goodman was evaluated in Emergency Department on 04/12/2019 for the symptoms described in the history of present illness. He was evaluated in the context of the global COVID-19 pandemic, which necessitated consideration that the patient might be at risk for infection with the SARS-CoV-2 virus that causes COVID-19. Institutional protocols and algorithms that pertain to the evaluation of patients at risk for COVID-19 are in a state of rapid change based on information released by regulatory bodies including the CDC and federal and state organizations. These policies and algorithms were followed during the patient's care in the ED.  Some ED evaluations and interventions may be delayed as a result of limited staffing during the pandemic.*      Medical Decision Making: 68 year old male here with persistent diarrhea, chills, shortness of breath, and generalized weakness.  Patient febrile, tachycardic, and borderline hypotensive on arrival.  He is profoundly hypovolemic.  Patient initially activated per sepsis protocol with broad-spectrum antibiotics and fluids.  Lab work shows profound hypomagnesemia, hypocalcemia, significant AoCKD which I suspect is all due to his profound hypovolemia due to chronic diarrhea.  Given recent negative CT and no abdominal pain, do not feel CT imaging is indicated.  Regarding his fever, he is COVID  positive.  Will need to be admitted for this.  IV electrolyte replacement performed, broad-spectrum antibiotics, fluids, supportive care.  ____________________________________________  FINAL CLINICAL IMPRESSION(S) / ED DIAGNOSES  Final diagnoses:  COVID-19  AKI (acute kidney injury) ([place])  Hypocalcemia  Hypomagnesemia  Dehydration     MEDICATIONS GIVEN DURING THIS VISIT:  Medications  sodium bicarbonate 150 mEq in dextrose 5% 1000 mL infusion ( Intravenous Rate/Dose Verify 04/12/19 1832)  ceFEPIme (MAXIPIME) 2 g in sodium chloride 0.9 % 100 mL IVPB (2 g Intravenous New Bag/Given 04/12/19 2008)  metroNIDAZOLE (FLAGYL) IVPB 500 mg (has no administration in time range)  sodium chloride 0.9 % bolus 750 mL (has no administration in time range)  vancomycin (VANCOCIN) 2,000 mg in sodium chloride 0.9 % 500 mL IVPB (2,000 mg Intravenous New Bag/Given 04/12/19 2015)  sodium chloride 0.9 % bolus 1,000 mL (0 mLs Intravenous Stopped 04/12/19 2005)  sodium chloride 0.9 % bolus 1,000 mL (0 mLs Intravenous Stopped 04/12/19 2016)  magnesium sulfate IVPB 2 g 50 mL (0 g Intravenous Stopped 04/12/19 1914)  calcium gluconate 1 g/ 50 mL sodium chloride IVPB (0 g Intravenous Stopped 04/12/19 2002)  calcium gluconate 1 g/ 50 mL sodium chloride IVPB (0 g Intravenous Stopped 04/12/19 1915)  acetaminophen (TYLENOL) tablet 1,000 mg (1,000 mg Oral Given 04/12/19 1838)     ED Discharge Orders    None       Note:  This document was prepared using Dragon voice recognition software and may include unintentional dictation errors.   Duffy Bruce, MD 04/12/19 2026

## 2019-04-12 NOTE — ED Notes (Signed)
Date and time results received: 04/12/19 1950 (use smartphrase ".now" to insert current time)  Test: COVID 19 Critical Value: positive result  Name of Provider Notified: Dr Ellender Hose

## 2019-04-12 NOTE — Consult Note (Signed)
PHARMACY -  BRIEF ANTIBIOTIC NOTE   Pharmacy has received consult(s) for Vancomycin/Cefepime from an ED provider.    The patient's profile has been reviewed for ht/wt/allergies/indication/available labs.    One time order(s) placed for Vancomycin 2g IVx1 and Cefepime 2g IVx1  Further antibiotics/pharmacy consults should be ordered by admitting physician if indicated.                       Thank you,  Lu Duffel, PharmD, BCPS Clinical Pharmacist 04/12/2019 6:35 PM

## 2019-04-12 NOTE — ED Notes (Signed)
Date and time results received: 04/12/19 1735   Test: magnesium Critical Value: 0.6  Name of Provider Notified: Dr. Ellender Hose  Orders Received? Or Actions Taken?:

## 2019-04-12 NOTE — ED Notes (Signed)
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 States he doesn't feel like it's in remission. States constant diarrhea. States had 40 episodes yesterday. Not on any steroids.

## 2019-04-12 NOTE — ED Notes (Signed)
Date and time results received: 04/12/19 1729   Test: calcium Critical Value: 5.0  Name of Provider Notified: Dr. Ellender Hose  Orders Received? Or Actions Taken?: meds ordered

## 2019-04-13 ENCOUNTER — Encounter (HOSPITAL_COMMUNITY): Payer: Self-pay | Admitting: Family Medicine

## 2019-04-13 ENCOUNTER — Inpatient Hospital Stay (HOSPITAL_COMMUNITY): Payer: Medicare Other

## 2019-04-13 ENCOUNTER — Inpatient Hospital Stay (HOSPITAL_COMMUNITY)
Admission: AD | Admit: 2019-04-13 | Discharge: 2019-04-18 | DRG: 177 | Disposition: A | Discharge status: Home Health | Payer: Medicare Other | Attending: Family Medicine | Admitting: Family Medicine

## 2019-04-13 DIAGNOSIS — E1165 Type 2 diabetes mellitus with hyperglycemia: Secondary | ICD-10-CM | POA: Diagnosis not present

## 2019-04-13 DIAGNOSIS — Z79899 Other long term (current) drug therapy: Secondary | ICD-10-CM | POA: Diagnosis not present

## 2019-04-13 DIAGNOSIS — J1289 Other viral pneumonia: Secondary | ICD-10-CM | POA: Diagnosis not present

## 2019-04-13 DIAGNOSIS — E1122 Type 2 diabetes mellitus with diabetic chronic kidney disease: Secondary | ICD-10-CM | POA: Diagnosis present

## 2019-04-13 DIAGNOSIS — I25810 Atherosclerosis of coronary artery bypass graft(s) without angina pectoris: Secondary | ICD-10-CM | POA: Diagnosis present

## 2019-04-13 DIAGNOSIS — Z955 Presence of coronary angioplasty implant and graft: Secondary | ICD-10-CM

## 2019-04-13 DIAGNOSIS — U071 COVID-19: Secondary | ICD-10-CM | POA: Diagnosis present

## 2019-04-13 DIAGNOSIS — I5022 Chronic systolic (congestive) heart failure: Secondary | ICD-10-CM | POA: Diagnosis present

## 2019-04-13 DIAGNOSIS — Z7901 Long term (current) use of anticoagulants: Secondary | ICD-10-CM | POA: Diagnosis not present

## 2019-04-13 DIAGNOSIS — E875 Hyperkalemia: Secondary | ICD-10-CM | POA: Diagnosis not present

## 2019-04-13 DIAGNOSIS — E871 Hypo-osmolality and hyponatremia: Secondary | ICD-10-CM | POA: Diagnosis present

## 2019-04-13 DIAGNOSIS — E1151 Type 2 diabetes mellitus with diabetic peripheral angiopathy without gangrene: Secondary | ICD-10-CM | POA: Diagnosis not present

## 2019-04-13 DIAGNOSIS — Z87891 Personal history of nicotine dependence: Secondary | ICD-10-CM | POA: Diagnosis not present

## 2019-04-13 DIAGNOSIS — I2510 Atherosclerotic heart disease of native coronary artery without angina pectoris: Secondary | ICD-10-CM | POA: Diagnosis present

## 2019-04-13 DIAGNOSIS — E876 Hypokalemia: Secondary | ICD-10-CM | POA: Diagnosis not present

## 2019-04-13 DIAGNOSIS — R079 Chest pain, unspecified: Secondary | ICD-10-CM | POA: Diagnosis not present

## 2019-04-13 DIAGNOSIS — E119 Type 2 diabetes mellitus without complications: Secondary | ICD-10-CM | POA: Diagnosis not present

## 2019-04-13 DIAGNOSIS — I482 Chronic atrial fibrillation, unspecified: Secondary | ICD-10-CM | POA: Diagnosis not present

## 2019-04-13 DIAGNOSIS — I130 Hypertensive heart and chronic kidney disease with heart failure and stage 1 through stage 4 chronic kidney disease, or unspecified chronic kidney disease: Secondary | ICD-10-CM | POA: Diagnosis not present

## 2019-04-13 DIAGNOSIS — E872 Acidosis, unspecified: Secondary | ICD-10-CM | POA: Diagnosis present

## 2019-04-13 DIAGNOSIS — Z951 Presence of aortocoronary bypass graft: Secondary | ICD-10-CM

## 2019-04-13 DIAGNOSIS — D649 Anemia, unspecified: Secondary | ICD-10-CM | POA: Diagnosis not present

## 2019-04-13 DIAGNOSIS — K2960 Other gastritis without bleeding: Secondary | ICD-10-CM | POA: Diagnosis present

## 2019-04-13 DIAGNOSIS — E785 Hyperlipidemia, unspecified: Secondary | ICD-10-CM | POA: Diagnosis present

## 2019-04-13 DIAGNOSIS — N179 Acute kidney failure, unspecified: Secondary | ICD-10-CM | POA: Diagnosis present

## 2019-04-13 DIAGNOSIS — R0600 Dyspnea, unspecified: Secondary | ICD-10-CM

## 2019-04-13 DIAGNOSIS — N184 Chronic kidney disease, stage 4 (severe): Secondary | ICD-10-CM | POA: Diagnosis not present

## 2019-04-13 DIAGNOSIS — I252 Old myocardial infarction: Secondary | ICD-10-CM

## 2019-04-13 DIAGNOSIS — R42 Dizziness and giddiness: Secondary | ICD-10-CM | POA: Diagnosis present

## 2019-04-13 DIAGNOSIS — Z85828 Personal history of other malignant neoplasm of skin: Secondary | ICD-10-CM

## 2019-04-13 DIAGNOSIS — J9600 Acute respiratory failure, unspecified whether with hypoxia or hypercapnia: Secondary | ICD-10-CM

## 2019-04-13 DIAGNOSIS — J9601 Acute respiratory failure with hypoxia: Secondary | ICD-10-CM | POA: Diagnosis present

## 2019-04-13 DIAGNOSIS — E860 Dehydration: Secondary | ICD-10-CM | POA: Diagnosis not present

## 2019-04-13 DIAGNOSIS — E861 Hypovolemia: Secondary | ICD-10-CM | POA: Diagnosis present

## 2019-04-13 DIAGNOSIS — Z923 Personal history of irradiation: Secondary | ICD-10-CM

## 2019-04-13 DIAGNOSIS — Z7951 Long term (current) use of inhaled steroids: Secondary | ICD-10-CM

## 2019-04-13 DIAGNOSIS — Z85818 Personal history of malignant neoplasm of other sites of lip, oral cavity, and pharynx: Secondary | ICD-10-CM

## 2019-04-13 DIAGNOSIS — Z794 Long term (current) use of insulin: Secondary | ICD-10-CM

## 2019-04-13 DIAGNOSIS — Z95820 Peripheral vascular angioplasty status with implants and grafts: Secondary | ICD-10-CM

## 2019-04-13 DIAGNOSIS — N189 Chronic kidney disease, unspecified: Secondary | ICD-10-CM | POA: Diagnosis not present

## 2019-04-13 DIAGNOSIS — Z7902 Long term (current) use of antithrombotics/antiplatelets: Secondary | ICD-10-CM

## 2019-04-13 DIAGNOSIS — K529 Noninfective gastroenteritis and colitis, unspecified: Secondary | ICD-10-CM | POA: Diagnosis present

## 2019-04-13 DIAGNOSIS — Z9221 Personal history of antineoplastic chemotherapy: Secondary | ICD-10-CM

## 2019-04-13 DIAGNOSIS — I4891 Unspecified atrial fibrillation: Secondary | ICD-10-CM | POA: Diagnosis present

## 2019-04-13 DIAGNOSIS — I25118 Atherosclerotic heart disease of native coronary artery with other forms of angina pectoris: Secondary | ICD-10-CM

## 2019-04-13 LAB — GLUCOSE, CAPILLARY
Glucose-Capillary: 106 mg/dL — ABNORMAL HIGH (ref 70–99)
Glucose-Capillary: 221 mg/dL — ABNORMAL HIGH (ref 70–99)
Glucose-Capillary: 201 mg/dL — ABNORMAL HIGH (ref 70–99)

## 2019-04-13 LAB — COMPREHENSIVE METABOLIC PANEL
ALT: 24 U/L (ref 0–44)
AST: 37 U/L (ref 15–41)
Albumin: 2.9 g/dL — ABNORMAL LOW (ref 3.5–5.0)
Alkaline Phosphatase: 55 U/L (ref 38–126)
Anion gap: 16 — ABNORMAL HIGH (ref 5–15)
BUN: 85 mg/dL — ABNORMAL HIGH (ref 8–23)
CO2: 14 mmol/L — ABNORMAL LOW (ref 22–32)
Calcium: 5.3 mg/dL — CL (ref 8.9–10.3)
Chloride: 108 mmol/L (ref 98–111)
Creatinine, Ser: 5.03 mg/dL — ABNORMAL HIGH (ref 0.61–1.24)
GFR calc Af Amer: 13 mL/min — ABNORMAL LOW (ref >60)
GFR calc non Af Amer: 11 mL/min — ABNORMAL LOW (ref >60)
Glucose, Bld: 78 mg/dL (ref 70–99)
Potassium: 3.7 mmol/L (ref 3.5–5.1)
Sodium: 138 mmol/L (ref 135–145)
Total Bilirubin: 0.4 mg/dL (ref 0.3–1.2)
Total Protein: 5.8 g/dL — ABNORMAL LOW (ref 6.5–8.1)

## 2019-04-13 LAB — CREATININE, URINE, RANDOM: Creatinine, Urine: 38.52 mg/dL

## 2019-04-13 LAB — FERRITIN: Ferritin: 859 ng/mL — ABNORMAL HIGH (ref 24–336)

## 2019-04-13 LAB — PROCALCITONIN: Procalcitonin: 1.28 ng/mL

## 2019-04-13 LAB — D-DIMER, QUANTITATIVE: D-Dimer, Quant: 10.01 ug/mL-FEU — ABNORMAL HIGH (ref 0.00–0.50)

## 2019-04-13 LAB — C-REACTIVE PROTEIN: CRP: 11.9 mg/dL — ABNORMAL HIGH (ref <1.0)

## 2019-04-13 LAB — MAGNESIUM: Magnesium: 0.8 mg/dL — CL (ref 1.7–2.4)

## 2019-04-13 LAB — SODIUM, URINE, RANDOM: Sodium, Ur: 58 mmol/L

## 2019-04-13 LAB — ABO/RH: ABO/RH(D): O POS

## 2019-04-13 IMAGING — DX DG CHEST 1V PORT
1 series · 1 of 1 positions shown · non-contrast
Comparison: [DATE], CT [DATE]

CLINICAL DATA: 67-year-old male with a history of dyspnea

EXAM:
PORTABLE CHEST 1 VIEW

[chest]
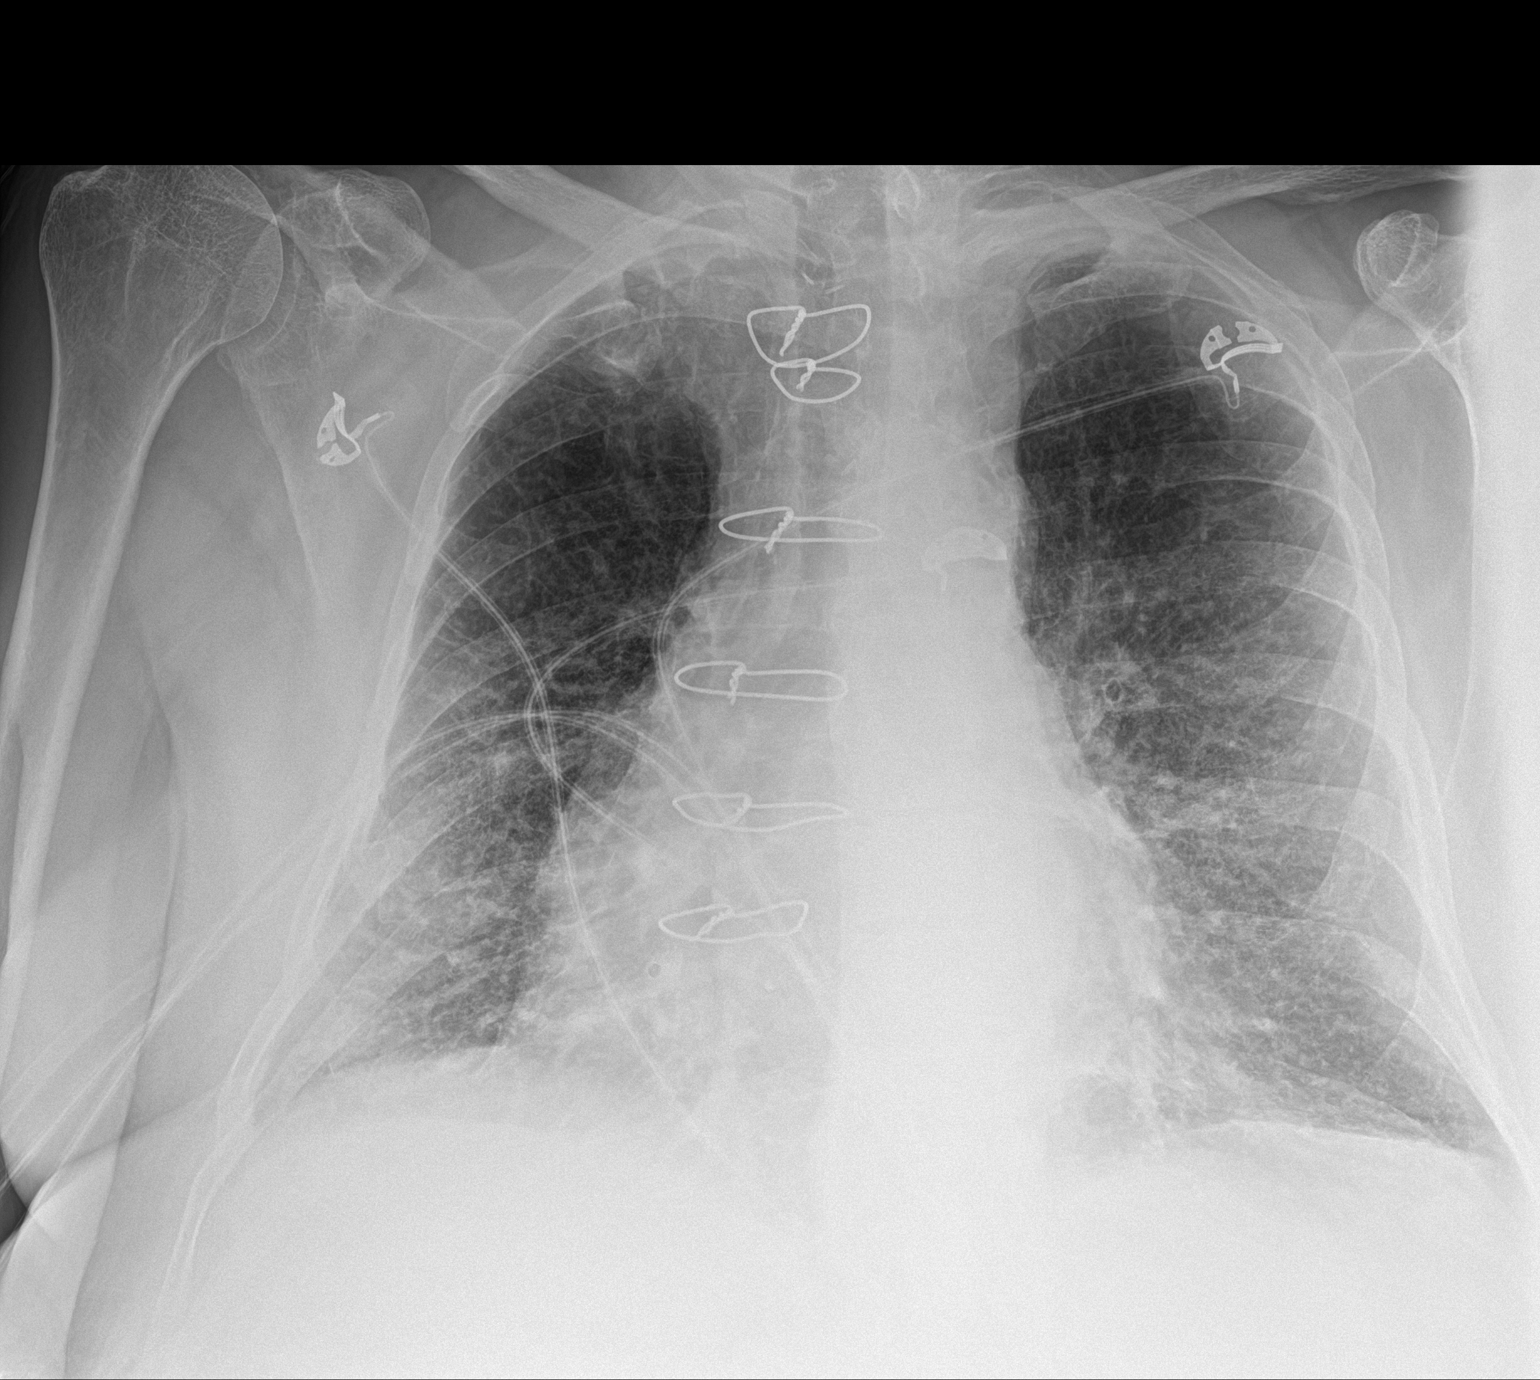

[1 of 1 positions shown; findings below may reference images not displayed]

FINDINGS: Cardiomediastinal silhouette unchanged in size and contour. No
evidence of central vascular congestion. Surgical changes of median
sternotomy.

Similar appearance of reticulonodular opacities of the bilateral
lungs with no new confluent airspace disease pneumothorax or pleural
effusion.
IMPRESSION: Similar appearance of chronic interstitial opacities favored to
represent chronic lung disease, however, superimposed atypical
infection cannot be excluded.

No evidence of lobar pneumonia.

Surgical changes of median sternotomy and CABG

## 2019-04-13 IMAGING — US US RENAL
1 series · 14 of 25 positions shown · non-contrast
Comparison: None.

CLINICAL DATA: Acute on chronic renal failure.

EXAM:
RENAL / URINARY TRACT ULTRASOUND COMPLETE

[Series 1: us renal · 0.33mm/px · 14 of 73 slices shown]
[im 1/73]
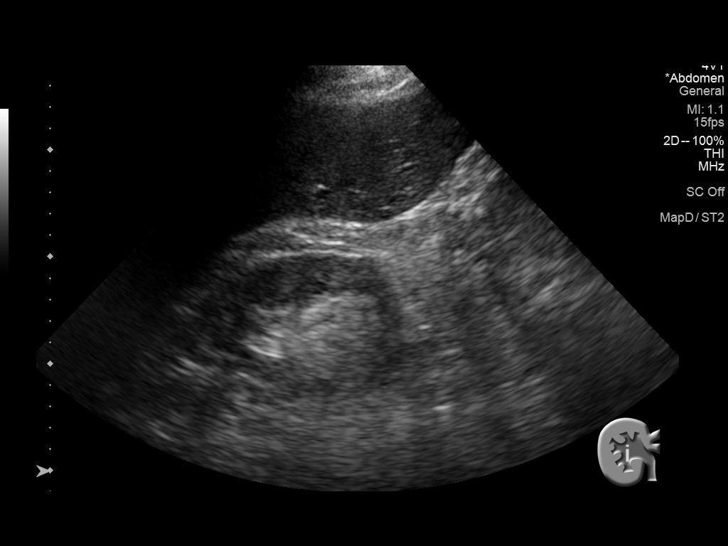
[im 7/73]
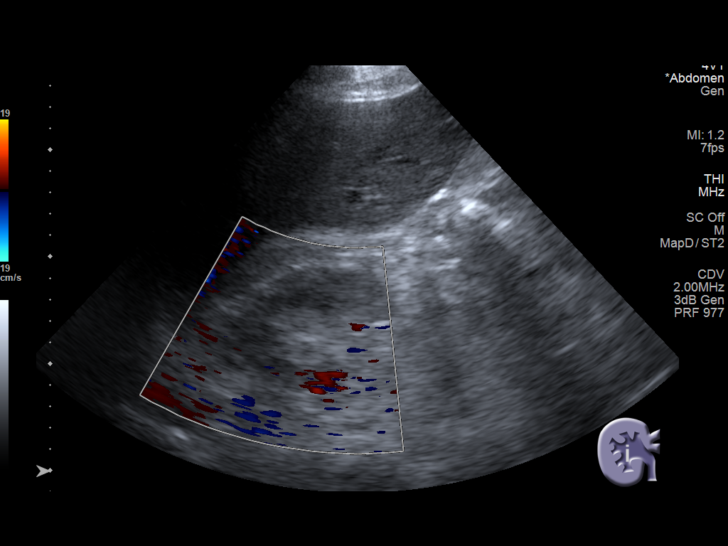
[im 13/73]
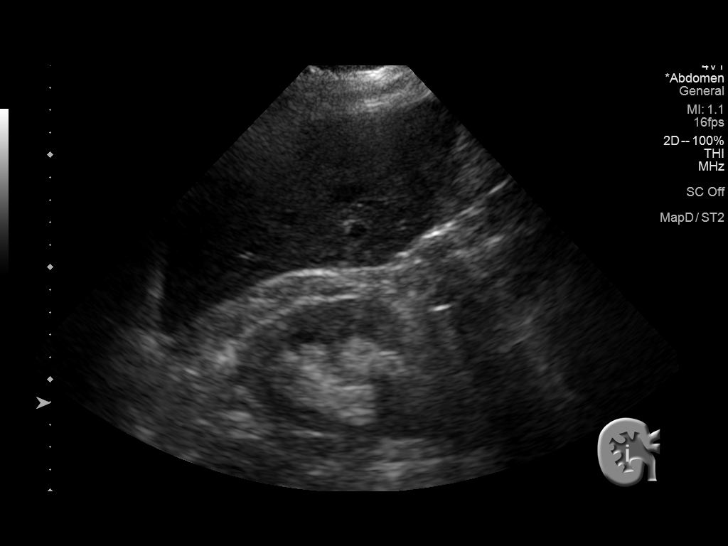
[im 19/73]
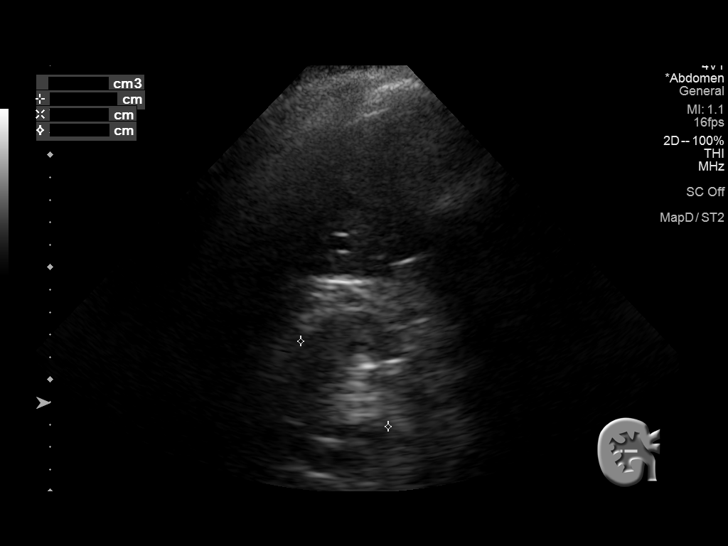
[im 25/73]
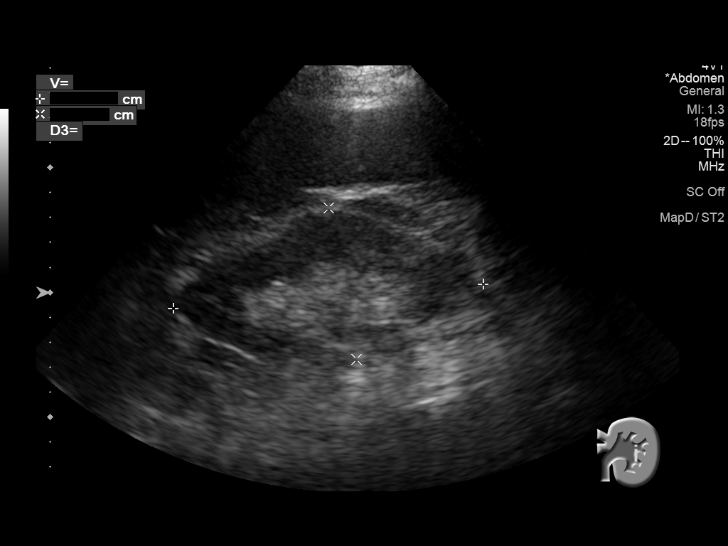
[im 28/73]
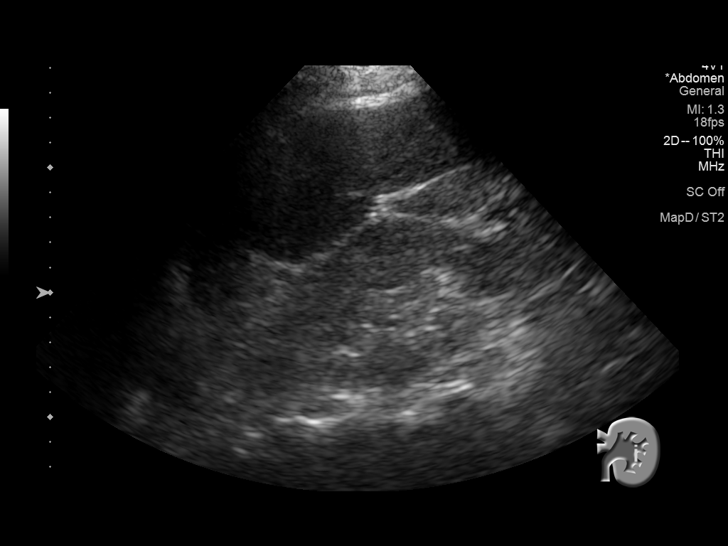
[im 34/73]
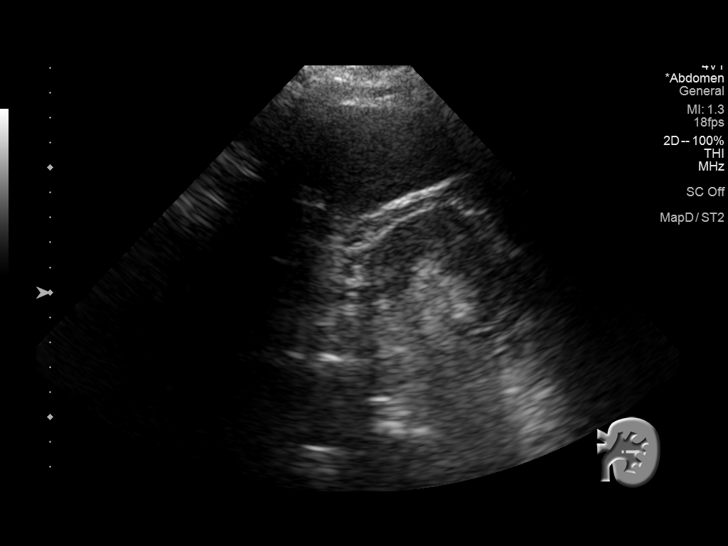
[im 40/73]
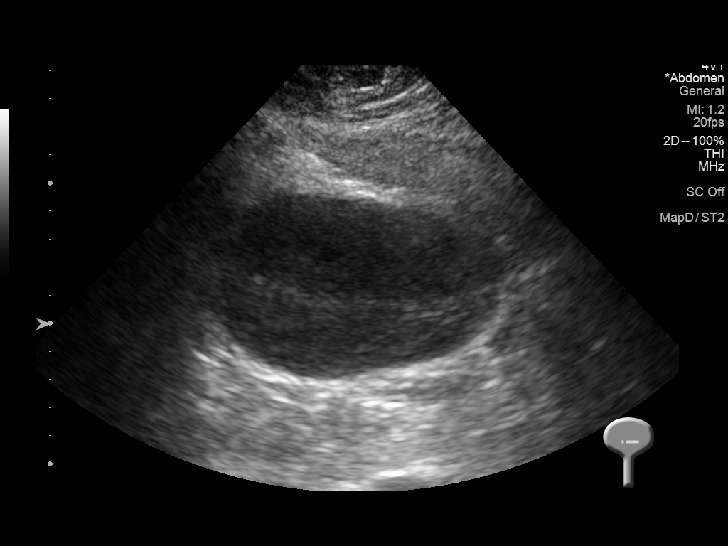
[im 46/73]
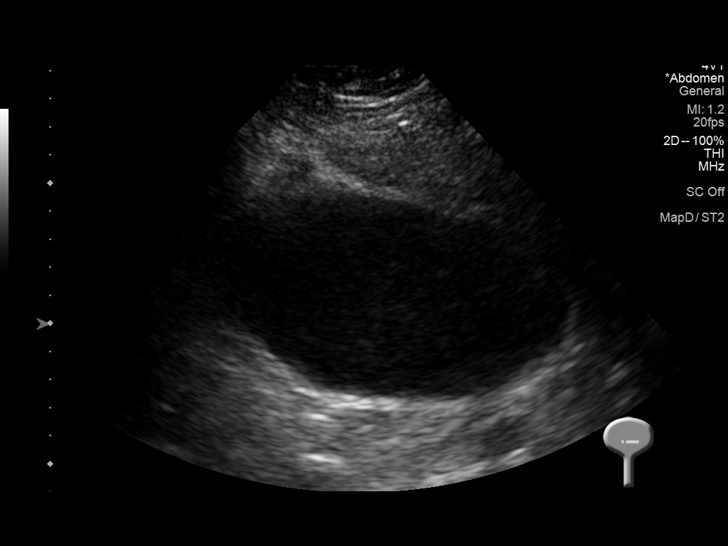
[im 49/73]
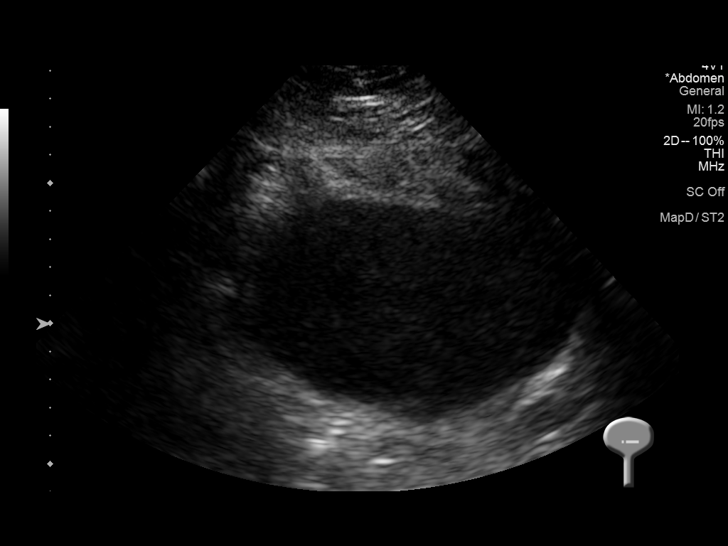
[im 55/73]
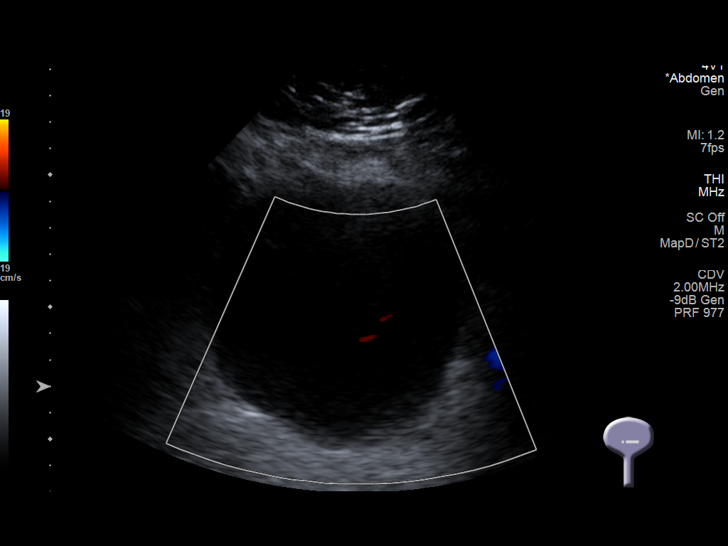
[im 61/73]
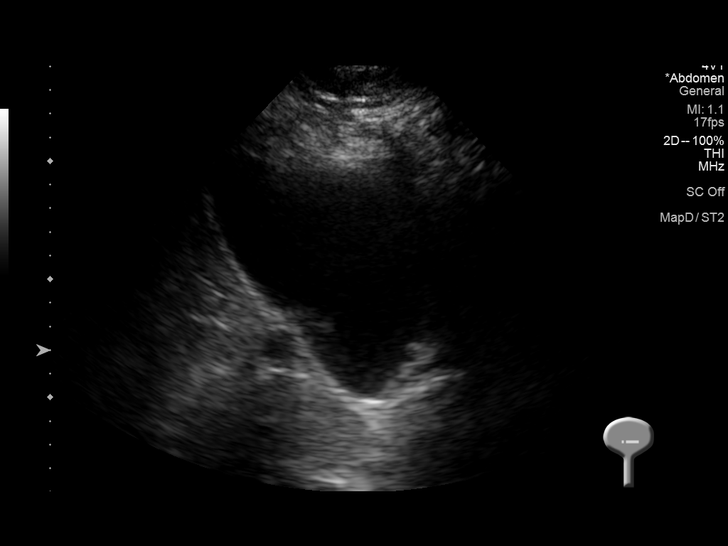
[im 67/73]
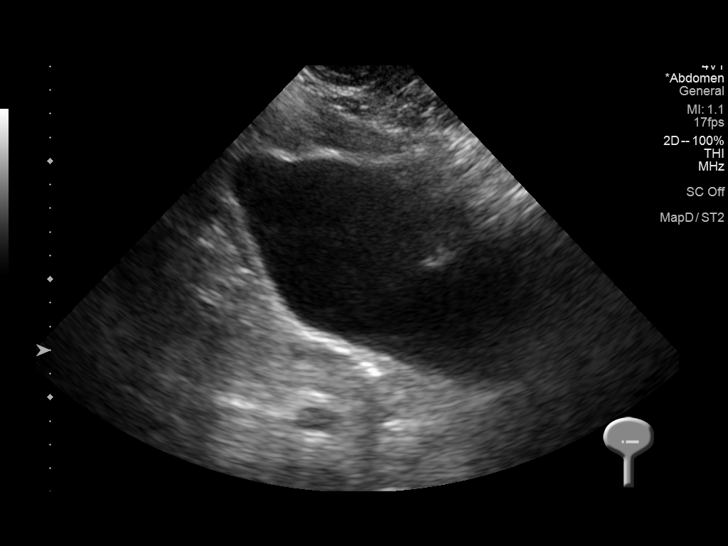
[im 73/73]
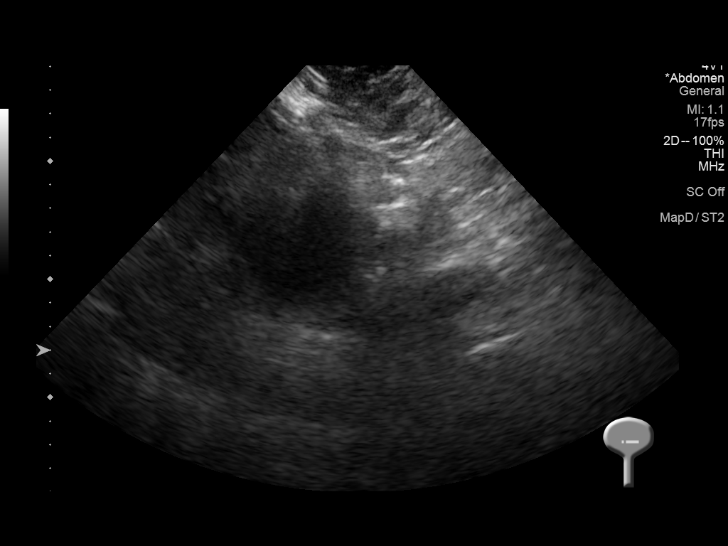

[14 of 25 positions shown; findings below may reference images not displayed]

FINDINGS: Right Kidney:

Renal measurements: 10.3 x 6.4 x 5.4 cm = volume: 188 mL. Slightly
increased echogenicity of renal parenchyma is noted. No mass or
hydronephrosis visualized.

Left Kidney:

Renal measurements: 12.5 x 6.2 x 5.6 cm = volume: 227 mL. Slightly
increased echogenicity of renal parenchyma is noted. No mass or
hydronephrosis visualized.

Bladder:

Appears normal for degree of bladder distention. Bilateral ureteral
jets are noted.
IMPRESSION: Slightly increased echogenicity of renal parenchyma is noted
bilaterally suggesting medical renal disease. No hydronephrosis or
renal obstruction is noted.

## 2019-04-13 MED ORDER — DEXAMETHASONE 6 MG PO TABS
12.0000 mg | ORAL_TABLET | Freq: Every day | ORAL | Status: DC
  Administered 2019-04-13 – 2019-04-14 (×2): 12 mg via ORAL
  Filled 2019-04-13 (×2): qty 2

## 2019-04-13 MED ORDER — SODIUM CHLORIDE 0.9 % IV SOLN
200.0000 mg | Freq: Once | INTRAVENOUS | Status: AC
  Administered 2019-04-13: 200 mg via INTRAVENOUS
  Filled 2019-04-13: qty 40

## 2019-04-13 MED ORDER — TICAGRELOR 60 MG PO TABS
60.0000 mg | ORAL_TABLET | Freq: Two times a day (BID) | ORAL | Status: DC
  Administered 2019-04-13 – 2019-04-18 (×11): 60 mg via ORAL
  Filled 2019-04-13 (×13): qty 1

## 2019-04-13 MED ORDER — DIAZEPAM 2 MG PO TABS: 2.0000 mg | ORAL_TABLET | Freq: Two times a day (BID) | ORAL | Status: DC | PRN

## 2019-04-13 MED ORDER — APIXABAN 5 MG PO TABS
5.0000 mg | ORAL_TABLET | Freq: Two times a day (BID) | ORAL | Status: DC
  Administered 2019-04-13 – 2019-04-18 (×11): 5 mg via ORAL
  Filled 2019-04-13 (×11): qty 1

## 2019-04-13 MED ORDER — ONDANSETRON HCL 4 MG/2ML IJ SOLN: 4.0000 mg | Freq: Four times a day (QID) | INTRAMUSCULAR | Status: DC | PRN

## 2019-04-13 MED ORDER — MAGNESIUM OXIDE 400 (241.3 MG) MG PO TABS: 400.0000 mg | ORAL_TABLET | Freq: Once | ORAL | Status: DC

## 2019-04-13 MED ORDER — ONDANSETRON HCL 4 MG PO TABS: 4.0000 mg | ORAL_TABLET | Freq: Four times a day (QID) | ORAL | Status: DC | PRN

## 2019-04-13 MED ORDER — DEXAMETHASONE 6 MG PO TABS
6.0000 mg | ORAL_TABLET | Freq: Every day | ORAL | Status: DC
  Administered 2019-04-13: 6 mg via ORAL
  Filled 2019-04-13: qty 1

## 2019-04-13 MED ORDER — STERILE WATER FOR INJECTION IV SOLN
INTRAVENOUS | Status: DC
  Administered 2019-04-13: 03:00:00 via INTRAVENOUS
  Filled 2019-04-13: qty 850

## 2019-04-13 MED ORDER — STERILE WATER FOR INJECTION IV SOLN
INTRAVENOUS | Status: AC
  Administered 2019-04-13: 16:00:00 via INTRAVENOUS
  Filled 2019-04-13: qty 850

## 2019-04-13 MED ORDER — INSULIN ASPART 100 UNIT/ML ~~LOC~~ SOLN
0.0000 [IU] | Freq: Three times a day (TID) | SUBCUTANEOUS | Status: DC
  Administered 2019-04-13 (×2): 3 [IU] via SUBCUTANEOUS
  Administered 2019-04-14 – 2019-04-15 (×4): 5 [IU] via SUBCUTANEOUS
  Administered 2019-04-15: 18:00:00 2 [IU] via SUBCUTANEOUS
  Administered 2019-04-15: 7 [IU] via SUBCUTANEOUS
  Administered 2019-04-16: 3 [IU] via SUBCUTANEOUS
  Administered 2019-04-16: 5 [IU] via SUBCUTANEOUS
  Administered 2019-04-16: 10:00:00 2 [IU] via SUBCUTANEOUS
  Administered 2019-04-17 (×3): 3 [IU] via SUBCUTANEOUS
  Administered 2019-04-18: 1 [IU] via SUBCUTANEOUS

## 2019-04-13 MED ORDER — APIXABAN 2.5 MG PO TABS
2.5000 mg | ORAL_TABLET | Freq: Two times a day (BID) | ORAL | Status: DC
  Filled 2019-04-13: qty 1

## 2019-04-13 MED ORDER — MAGNESIUM OXIDE 400 (241.3 MG) MG PO TABS
400.0000 mg | ORAL_TABLET | Freq: Two times a day (BID) | ORAL | Status: AC
  Administered 2019-04-13 (×2): 400 mg via ORAL
  Filled 2019-04-13 (×2): qty 1

## 2019-04-13 MED ORDER — SODIUM CHLORIDE 0.9% FLUSH
3.0000 mL | Freq: Two times a day (BID) | INTRAVENOUS | Status: DC
  Administered 2019-04-13 – 2019-04-18 (×11): 3 mL via INTRAVENOUS

## 2019-04-13 MED ORDER — HYDROCODONE-ACETAMINOPHEN 5-325 MG PO TABS: 1.0000 | ORAL_TABLET | Freq: Four times a day (QID) | ORAL | Status: DC | PRN

## 2019-04-13 MED ORDER — ORAL CARE MOUTH RINSE
15.0000 mL | Freq: Two times a day (BID) | OROMUCOSAL | Status: DC
  Administered 2019-04-13 – 2019-04-18 (×10): 15 mL via OROMUCOSAL

## 2019-04-13 MED ORDER — SODIUM CHLORIDE 0.9 % IV SOLN
100.0000 mg | INTRAVENOUS | Status: AC
  Administered 2019-04-14 – 2019-04-17 (×4): 100 mg via INTRAVENOUS
  Filled 2019-04-13 (×4): qty 20

## 2019-04-13 MED ORDER — CILOSTAZOL 100 MG PO TABS
100.0000 mg | ORAL_TABLET | Freq: Two times a day (BID) | ORAL | Status: DC
  Administered 2019-04-13 – 2019-04-18 (×11): 100 mg via ORAL
  Filled 2019-04-13 (×13): qty 1

## 2019-04-13 MED ORDER — PRAVASTATIN SODIUM 10 MG PO TABS
10.0000 mg | ORAL_TABLET | Freq: Every day | ORAL | Status: DC
  Administered 2019-04-13 – 2019-04-17 (×5): 10 mg via ORAL
  Filled 2019-04-13 (×5): qty 1

## 2019-04-13 MED ORDER — MECLIZINE HCL 12.5 MG PO TABS
12.5000 mg | ORAL_TABLET | Freq: Three times a day (TID) | ORAL | Status: DC | PRN
  Administered 2019-04-15 – 2019-04-16 (×2): 12.5 mg via ORAL
  Filled 2019-04-13 (×3): qty 1

## 2019-04-13 MED ORDER — INSULIN ASPART 100 UNIT/ML ~~LOC~~ SOLN
0.0000 [IU] | Freq: Every day | SUBCUTANEOUS | Status: DC
  Administered 2019-04-13: 3 [IU] via SUBCUTANEOUS
  Administered 2019-04-14: 4 [IU] via SUBCUTANEOUS
  Administered 2019-04-15: 2 [IU] via SUBCUTANEOUS
  Administered 2019-04-16: 22:00:00 5 [IU] via SUBCUTANEOUS

## 2019-04-13 MED ORDER — CARVEDILOL 3.125 MG PO TABS
6.2500 mg | ORAL_TABLET | Freq: Two times a day (BID) | ORAL | Status: DC
  Administered 2019-04-13 – 2019-04-15 (×6): 6.25 mg via ORAL
  Filled 2019-04-13 (×6): qty 2

## 2019-04-13 MED ORDER — PANTOPRAZOLE SODIUM 40 MG PO TBEC
40.0000 mg | DELAYED_RELEASE_TABLET | Freq: Every day | ORAL | Status: DC
  Administered 2019-04-13 – 2019-04-18 (×6): 40 mg via ORAL
  Filled 2019-04-13 (×6): qty 1

## 2019-04-13 MED ORDER — CALCIUM CARBONATE 1250 (500 CA) MG PO TABS
1.0000 | ORAL_TABLET | Freq: Three times a day (TID) | ORAL | Status: AC
  Administered 2019-04-13 (×3): 500 mg via ORAL
  Filled 2019-04-13 (×3): qty 1

## 2019-04-13 MED ORDER — ISOSORBIDE MONONITRATE ER 30 MG PO TB24: 120.0000 mg | ORAL_TABLET | Freq: Every day | ORAL | Status: DC

## 2019-04-13 MED ORDER — ACETAMINOPHEN 325 MG PO TABS: 650.0000 mg | ORAL_TABLET | Freq: Four times a day (QID) | ORAL | Status: DC | PRN

## 2019-04-13 NOTE — Plan of Care (Signed)
Pt is progressing 

## 2019-04-13 NOTE — H&P (Addendum)
History and Physical    Dinero Chavira EVO:[id number] DOB: [birth year] DOA: 04/13/2019  PCP: Steele Sizer, MD   Patient coming from: Home   Chief Complaint: Lightheaded, chills, cough, diarrhea, SOB   HPI: Dylan Goodman is a 68 y.o. 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  Patient reported that his diarrhea had been worse than usual for the past several days and he has become lightheaded.  He felt as though he might pass out at one point, but has not actually lost consciousness.  He also reported chills, malaise, and cough with shortness of breath.  He denied chest pain and denied abdominal pain.  He reports to household contacts that have had COVID 19.  ED Course: Upon arrival to the ED, patient is found to be febrile to 38.5, saturating 92% on 2 L/min of supplemental oxygen, tachypneic, tachycardic in the low 100s, and with blood pressure 97 systolic.  EKG features atrial fibrillation with PVCs.  Noncontrast head CT is negative for acute intracranial abnormality.  Chest x-ray is negative for acute findings.  Chemistry panel is notable for sodium 131, potassium 5.2, bicarbonate 12, magnesium 0.6, calcium 5.0, BUN 94, and creatinine 5.93, up from 2.1 earlier this month.  CBC features of mild normocytic anemia.  Lactic acid is reassuringly normal.  Blood cultures were collected, patient was treated with cefepime, vancomycin, Flagyl, Tylenol, 2.75 L of normal saline, 2 g IV magnesium, 2 g IV calcium, and started on isotonic bicarbonate infusion.  Review of Systems:  All other systems reviewed and apart from HPI, are negative.  Past Medical  History:  Diagnosis Date  . Bleeding gastrointestinal 09/03/2018  . 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 Chronic depression 09/03/2018  . CKD (chronic kidney disease) 09/03/2018  . Coronary atherosclerosis 09/03/2018  . Coronary bypass graft mechanical complication 29/9371  . Diabetes mellitus without complication ([place])   . Gastric ulcer with hemorrhage 09/03/2018  . Gastritis 09/03/2018  . GERD (gastroesophageal reflux disease) 09/03/2018  . Heart disease   . Heart murmur   . IDA (iron deficiency anemia) 09/03/2018  . Low magnesium levels 09/03/2018  . 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 Myocardial infarction ([place]) 2017  . Personal history of radiation therapy 05/30/2018   39 treatments with chemotherapy nasopharyngeal cancer  . 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 CAROTID ENDARTERECTOMY  2006  . COLONOSCOPY WITH PROPOFOL N/A 03/11/2019   Procedure: COLONOSCOPY WITH PROPOFOL;  Surgeon: Toledo, Benay Pike, MD;  Location: [facility] ENDOSCOPY;  Service: Gastroenterology;  Laterality: N/A;  . CORONARY ARTERY BYPASS GRAFT  1996   quadruple bypass  . ESOPHAGOGASTRODUODENOSCOPY (EGD) WITH PROPOFOL N/A 03/11/2019   Procedure: ESOPHAGOGASTRODUODENOSCOPY (EGD) WITH PROPOFOL;  Surgeon: Toledo, Benay Pike, MD;  Location: [facility] ENDOSCOPY;  Service: Gastroenterology;  Laterality: N/A;  . heart stent  10/2010   x3  . 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 left leg stents  08/2009  . LOWER EXTREMITY ANGIOGRAPHY Left 11/11/2018   Procedure: LOWER EXTREMITY ANGIOGRAPHY;  Surgeon: Katha Cabal, MD;  Location: [facility];  Service: Cardiovascular;  Laterality: Left;  .  REVISION OF AORTA BIFEMORAL BYPASS    . right femo pop bypass  06/1998   11/2001, 08/2003  . right leg stent  07/2009     reports that he quit smoking about 2 years ago. His smoking use included cigarettes.  He has a 75.00 pack-year smoking history. He has never used smokeless tobacco. He reports previous alcohol use. He reports that he does not use drugs.  No Known Allergies  Family History  Problem Relation Age of Onset  . Coronary artery disease Mother   . Alcohol abuse Father   . Colon cancer Father   . Coronary artery disease Father   . Kidney cancer Father   . Breast cancer Sister      Prior to Admission medications   Medication Sig Start Date End Date Taking? Authorizing Provider  budesonide-formoterol (SYMBICORT) 160-4.5 MCG/ACT inhaler Inhale 2 puffs into the lungs 2 (two) times daily. Rinse mouth after use Patient not taking: Reported on 04/01/2019 03/17/19   Laverle Hobby, MD  carvedilol (COREG) 6.25 MG tablet Take 6.25 mg by mouth 2 (two) times daily.       Cholecalciferol (VITAMIN D3) 25 MCG (1000 UT) CAPS Take by mouth.    [provider]  cilostazol (PLETAL) 100 MG tablet Take 100 mg by mouth 2 (two) times daily.     [provider]  diazepam (VALIUM) 2 MG tablet Take 1 tablet (2 mg total) by mouth every 12 (twelve) hours as needed (vertigo). 04/05/19 04/04/20  Nena Polio, MD  ELIQUIS 2.5 MG TABS tablet Take 2.5 mg by mouth 2 (two) times daily.  08/28/18   [provider]  ezetimibe (ZETIA) 10 MG tablet Take 10 mg by mouth daily.  07/07/18   [provider]  fenofibrate 160 MG tablet Take 160 mg by mouth daily.     [provider]  Insulin Degludec (TRESIBA FLEXTOUCH) 200 UNIT/ML SOPN Inject 12 Units into the skin at bedtime.  05/17/18   [provider]  isosorbide mononitrate (IMDUR) 120 MG 24 hr tablet Take 120 mg by mouth daily.    [provider]  lansoprazole (PREVACID) 30 MG capsule Take 30 mg by mouth every morning.     [provider]  losartan (COZAAR) 25 MG tablet Take 25 mg by mouth daily.  08/29/18   [provider]  lovastatin (MEVACOR) 40 MG tablet Take 40 mg by mouth 2 (two)  times daily.  07/01/18   [provider]  meclizine (ANTIVERT) 12.5 MG tablet Take 1 tablet (12.5 mg total) by mouth 3 (three) times daily as needed for dizziness. 04/05/19   Nena Polio, MD  meclizine (ANTIVERT) 25 MG tablet  03/17/19   [provider]  nitroGLYCERIN (NITROSTAT) 0.4 MG SL tablet Place 0.4 mg under the tongue every 5 (five) minutes as needed for chest pain.  07/01/18   [provider]  sodium bicarbonate 650 MG tablet Take 1 tablet by mouth daily.  12/31/18   Kolluru, Lurena Nida, MD  ticagrelor (BRILINTA) 60 MG TABS tablet Take 60 mg by mouth 2 (two) times daily.     [provider]    Physical Exam: Vitals:   04/13/19 0208  BP: (!) 129/91  Pulse: 97  Temp: (!) 96.8 F (36 C)  TempSrc: Axillary  SpO2: 96%    Constitutional: NAD, calm  Eyes: PERTLA, lids and conjunctivae normal ENMT: Mucous membranes are moist. Posterior pharynx clear of any exudate or lesions.   Neck: normal, supple,  no masses, no thyromegaly Respiratory: no wheezing, no crackles. No accessory muscle use.  Cardiovascular: Rate ~80 and irregularly irregular. No extremity edema.  Abdomen: No distension, no tenderness, soft. Bowel sounds active.  Musculoskeletal: no clubbing / cyanosis. No joint deformity upper and lower extremities.    Skin: no significant rashes, lesions, ulcers. Warm, dry, well-perfused. Neurologic: No gross facial asymmetry. Sensation intact. Moving all extremities.  Psychiatric: Alert and oriented to person, place, and situation. Very pleasant, cooperative.    Labs on Admission: I have personally reviewed following labs and imaging studies  CBC: Recent Labs  Lab 04/12/19 1658  WBC 5.3  HGB 12.4*  HCT 38.0*  MCV 88.4  PLT 161   Basic Metabolic Panel: Recent Labs  Lab 04/12/19 1658  NA 131*  K 5.2*  CL 103  CO2 12*  GLUCOSE 93  BUN 94*  CREATININE 5.93*  CALCIUM 5.0*  MG 0.6*   GFR: Estimated Creatinine Clearance: 15 mL/min (A)  (by C-G formula based on SCr of 5.93 mg/dL (H)). Liver Function Tests: Recent Labs  Lab 04/12/19 1658  AST 50*  ALT 26  ALKPHOS 56  BILITOT 1.2  PROT 6.4*  ALBUMIN 3.0*   No results for input(s): LIPASE, AMYLASE in the last 168 hours. No results for input(s): AMMONIA in the last 168 hours. Coagulation Profile: No results for input(s): INR, PROTIME in the last 168 hours. Cardiac Enzymes: No results for input(s): CKTOTAL, CKMB, CKMBINDEX, TROPONINI in the last 168 hours. BNP (last 3 results) No results for input(s): PROBNP in the last 8760 hours. HbA1C: No results for input(s): HGBA1C in the last 72 hours. CBG: No results for input(s): GLUCAP in the last 168 hours. Lipid Profile: No results for input(s): CHOL, HDL, LDLCALC, TRIG, CHOLHDL, LDLDIRECT in the last 72 hours. Thyroid Function Tests: No results for input(s): TSH, T4TOTAL, FREET4, T3FREE, THYROIDAB in the last 72 hours. Anemia Panel: No results for input(s): VITAMINB12, FOLATE, FERRITIN, TIBC, IRON, RETICCTPCT in the last 72 hours. Urine analysis:    Component Value Date/Time   COLORURINE YELLOW (A) 04/12/2019 2214   APPEARANCEUR CLEAR (A) 04/12/2019 2214   LABSPEC 1.006 04/12/2019 2214   PHURINE 5.0 04/12/2019 2214   GLUCOSEU NEGATIVE 04/12/2019 2214   HGBUR SMALL (A) 04/12/2019 2214   BILIRUBINUR NEGATIVE 04/12/2019 2214   [place] 04/12/2019 2214   PROTEINUR NEGATIVE 04/12/2019 2214   NITRITE NEGATIVE 04/12/2019 2214   LEUKOCYTESUR NEGATIVE 04/12/2019 2214   Sepsis Labs: @LABRCNTIP (procalcitonin:4,lacticidven:4) ) Recent Results (from the past 240 hour(s))  SARS Coronavirus 2 [facility] order, Performed in [facility] hospital lab) Nasopharyngeal Nasopharyngeal Swab     Status: Abnormal   Collection Time: 04/12/19  6:04 PM   Specimen: Nasopharyngeal Swab  Result Value Ref Range Status   SARS Coronavirus 2 POSITIVE (A) NEGATIVE Final    Comment: RESULT CALLED TO, READ BACK BY AND VERIFIED WITH:  TOM NAGY AT 1950 04/12/2019 [facility] (NOTE) If result is NEGATIVE SARS-CoV-2 target nucleic acids are NOT DETECTED. The SARS-CoV-2 RNA is generally detectable in upper and lower  respiratory specimens during the acute phase of infection. The lowest  concentration of SARS-CoV-2 viral copies this assay can detect is 250  copies / mL. A negative result does not preclude SARS-CoV-2 infection  and should not be used as the sole basis for treatment or other  patient management decisions.  A negative result may occur with  improper specimen collection / handling, submission of specimen other  than nasopharyngeal swab, presence of viral  mutation(s) within the  areas targeted by this assay, and inadequate number of viral copies  (<250 copies / mL). A negative result must be combined with clinical  observations, patient history, and epidemiological information. If result is POSITIVE SARS-CoV-2 target nucleic acids are DETECTED. The S ARS-CoV-2 RNA is generally detectable in upper and lower  respiratory specimens during the acute phase of infection.  Positive  results are indicative of active infection with SARS-CoV-2.  Clinical  correlation with patient history and other diagnostic information is  necessary to determine patient infection status.  Positive results do  not rule out bacterial infection or co-infection with other viruses. If result is PRESUMPTIVE POSTIVE SARS-CoV-2 nucleic acids MAY BE PRESENT.   A presumptive positive result was obtained on the submitted specimen  and confirmed on repeat testing.  While 2019 novel coronavirus  (SARS-CoV-2) nucleic acids may be present in the submitted sample  additional confirmatory testing may be necessary for epidemiological  and / or clinical management purposes  to differentiate between  SARS-CoV-2 and other Sarbecovirus currently known to infect humans.  If clinically indicated additional testing with an alternate test  methodology [phone]) is  adv ised. The SARS-CoV-2 RNA is generally  detectable in upper and lower respiratory specimens during the acute  phase of infection. The expected result is Negative. Fact Sheet for Patients:  StrictlyIdeas.no Fact Sheet for Healthcare Providers: BankingDealers.co.za This test is not yet approved or cleared by the [place] FDA and has been authorized for detection and/or diagnosis of SARS-CoV-2 by FDA under an Emergency Use Authorization (EUA).  This EUA will remain in effect (meaning this test can be used) for the duration of the COVID-19 declaration under Section 564(b)(1) of the Act, 21 U.S.C. section 360bbb-3(b)(1), unless the authorization is terminated or revoked sooner. Performed at [facility], [address] [place], [place] [address]      Radiological Exams on Admission: Ct Head Wo Contrast  Result Date: 04/12/2019 CLINICAL DATA:  Ataxia. Sudden onset of dizziness today. EXAM: CT HEAD WITHOUT CONTRAST TECHNIQUE: Contiguous axial images were obtained from the base of the skull through the vertex without intravenous contrast. COMPARISON:  MRI of the brain dated 03/12/2019 FINDINGS: Brain: No evidence of acute infarction, hemorrhage, hydrocephalus, extra-axial collection or mass lesion/mass effect. Mild diffuse atrophy. Vascular: No hyperdense vessel. Dense calcification in the distal vertebral arteries. Skull: Normal. Negative for fracture or focal lesion. Sinuses/Orbits: No acute finding. Other: None IMPRESSION: No acute abnormalities. Mild diffuse atrophy. Dense calcification in the distal vertebral arteries. Electronically Signed   By: Lorriane Shire M.D.   On: 04/12/2019 17:56   Dg Chest Portable 1 View  Result Date: 04/12/2019 CLINICAL DATA:  Fever and chills. Acute onset of dizziness. EXAM: PORTABLE CHEST 1 VIEW COMPARISON:  03/18/2019 FINDINGS: Heart size and pulmonary vascularity are normal. Aortic  atherosclerosis. Chronic accentuation of the interstitial markings. No infiltrates or effusions. No acute bone abnormality. IMPRESSION: No acute cardiopulmonary abnormality. Chronic interstitial lung disease. Aortic atherosclerosis. Electronically Signed   By: Lorriane Shire M.D.   On: 04/12/2019 18:19    EKG: Independently reviewed. Atrial fibrillation, PVC's.   Assessment/Plan   1. Acute kidney injury superimposed on CKD stage IV; acidosis; hyperkalemia  - Presents with lightheadedness after recent worsening in his chronic diarrhea and is found to have BUN 94 and SCr 5.93, up from 33 and 2.1 earlier this month; bicarb is 12 and potassium 5.2  - Acute prerenal azotemia suspected in setting of diarrhea, likely compounded  by continued losartan use  - He was given 2.75 liters of NS in ED and started on isotonic bicarbonate infusion  - Check urine chemistries and renal US, repeat chem panel now, renally-dose medications, and hold losartan    2. COVID-19 infection; acute hypoxic respiratory failure   - Presents with fever/chills, diarrhea, cough, and SOB after household contacts had been diagnosed with COVID-19   - No acute finding on CXR  - Now requiring 4 Lpm supplemental O2  - Check/trend inflammatory markers, continue supplemental O2 as needed, start Decadron    3. Hyponatremia; hypomagnesemia; hypocalcemia  - Serum sodium is 131 in setting of hypovolemia and expected to improve after fluid-resuscitation with NS in ED  - Magnesium is 0.6 in ED, likely secondary to diarrhea, treated with 2 g IV magnesium in ED and with repeat level pending  - Corrected calcium is 5.8, likely related to hypomagnessemia and treated with 2 g IV calcium in ED, repeat chem panel pending    4. Chronic systolic CHF  - Appears compensated  - EF was 35-40% in August 2020  - Follow daily wt and I/O's, hold ARB in light of AKI, continue Coreg as tolerated    5. CAD  - No anginal complaints  - Continue Brilinta,  statin, Imdur, beta-blocker    6. Insulin-dependent DM  - A1c was 6.2% in August 2020  - Check CBG's, start with low-intensity SSI with Novolog and adjust as needed    7. Chronic diarrhea  - Attributed to Menetrier disease, treated with chemotherapy in 2018, followed by GI  - Diarrhea has been worse than usual for a few days leading up to the admission, likely secondary to COVID-19 infection  - Continue IVF, electrolyte replacement    8. Atrial fibrillation  - In rate-controlled atrial fibrillation on admission - CHADS-VASc is at least 4 (age, CAD, CHF, DM) - Continue Eliquis     PPE: CAPR, gown, gloves  DVT prophylaxis: Eliquis  Code Status: Full  Family Communication: Discussed with patient  Consults called: None  Admission status: Inpatient     Vianne Bulls, MD [facility] Pager [phone]  If 7PM-7AM, please contact night-coverage [email or web address] Password [facility]  04/13/2019, 2:37 AM

## 2019-04-13 NOTE — Progress Notes (Signed)
Pt was admitted for COVID. He has a significant medical hx. Currently on O2 supplementation. Remdesivir was ordered x5d. He has stage 4 CKD. ALT is normal.   He was on apixaban 2.5mg  BID PTA. However, he is <68yo, wt>60kg. He should be on apixaban 5mg  BID. [place] with Dr. Aileen Fass to resume at correct dose.    Remdesivir 200mg  IV x1 then 100mg  IV q24 x4d Resume apixaban 5mg  PO BID  Onnie Boer, PharmD, [place], AAHIVP, CPP Infectious Disease Pharmacist 04/13/2019 10:02 AM

## 2019-04-13 NOTE — Progress Notes (Signed)
[facility] PROGRESS NOTE    Progress Note  Dylan Goodman  BUL:[id number] DOB: April 18, 1951 DOA: 04/13/2019 PCP: Steele Sizer, MD     Brief Narrative:   Dylan Goodman is an 68 y.o. male past medical history significant for coronary artery disease, chronic systolic heart failure with an EF of 35%, insulin-dependent diabetes mellitus, chronic diarrhea, atrial fibrillation on Eliquis and chronic kidney disease stage IV (with a baseline creatinine of 2.6), who presents to the ED for lightheadedness worsening diarrhea cough and shortness of breath.  Patient reports that his diarrhea has been progressively getting worst over the last several days point where he felt he might pass out.  In the ED was found to have a temperature 101 and noted to be hypoxic was placed on 2 L of oxygen.  He was also found tachycardic with a borderline low blood pressure.  Assessment/Plan:   Acute renal failure superimposed on stage 4 chronic kidney disease/metabolic acidosis and hyperkalemia: Likely prerenal azotemia in the setting of decreased oral intake and diarrhea and ongoing losartan use. ARB was held on admission, he was given almost 3 L of normal saline in the ED and started on isotonic bicarbonate infusion. Bicarbonate this morning is slowly improving along with his creatinine and potassium.  Acute respiratory failure with hypoxia due to COVID-19 viral infection: With fever chills diarrhea and shortness of breath after household contacts. He is currently on 2 L supplemental oxygen to keep saturation greater than 92%. His inflammatory markers are significantly elevated he was started on cefepime IV vancomycin and Flagyl by the ED which is now been stopped. We will start him on IV Remdesivir and IV steroids. I reviewed his chest x-ray from yesterday, does not show any acute infiltrates, he is currently being hydrated we will repeat a chest x-ray 04/14/2019.  Hypovolemic hyponatremia:  Improved with IV fluid hydration.  Hypomagnesemia/hypocalcemia: Likely secondary to diarrhea he was given 2 g of IV magnesium in the ED and is improved minimally. We will replete orally. His corrected calcium is about 6 he was also given IV calcium in the ED will replete these orally and recheck in the morning.  Chronic systolic heart failure: He appears to be compensated his EF was 35% on August 2020. Currently holding his ARB and Imdur, continuing his Coreg. We will follow strict I's and O's and daily weights. According to I's and O documented in the computer he is negative about 100 cc.  Due to his decreased intravascular volume will need to try to aim for a positive volume.  CAD: Continue Brilinta statin, Imdur and beta-blockers. He has no current planes of chest pain.  Insulin-dependent diabetes mellitus type 2: With an A1c of 6.2 Continue CBGs before meals and at bedtime, continue sensitive scale sliding scale.  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 He relates he has been worse for the last several days likely due to COVID-19 viral infection.  Chronic atrial fibrillation: CHADS-VASc is at least 4. Resume Eliquis.  DVT prophylaxis: Eliquis Family Communication:none Disposition Plan/Barrier to D/C: unable to determine Code Status:     Code Status Orders  (From admission, onward)         Start     Ordered   04/13/19 0232  Full code  Continuous     04/13/19 0236        Code Status History    Date Active Date Inactive Code Status Order ID Comments User Context  03/12/2019 1725 03/13/2019 1933 Full Code [id number]  Gladstone Lighter, MD Inpatient   Advance Care Planning Activity        IV Access:    Peripheral IV   Procedures and diagnostic studies:   Ct Head Wo Contrast  Result Date: 04/12/2019 CLINICAL DATA:  Ataxia. Sudden onset of dizziness today. EXAM: CT HEAD WITHOUT CONTRAST  TECHNIQUE: Contiguous axial images were obtained from the base of the skull through the vertex without intravenous contrast. COMPARISON:  MRI of the brain dated 03/12/2019 FINDINGS: Brain: No evidence of acute infarction, hemorrhage, hydrocephalus, extra-axial collection or mass lesion/mass effect. Mild diffuse atrophy. Vascular: No hyperdense vessel. Dense calcification in the distal vertebral arteries. Skull: Normal. Negative for fracture or focal lesion. Sinuses/Orbits: No acute finding. Other: None IMPRESSION: No acute abnormalities. Mild diffuse atrophy. Dense calcification in the distal vertebral arteries. Electronically Signed   By: Lorriane Shire M.D.   On: 04/12/2019 17:56   Dg Chest Portable 1 View  Result Date: 04/12/2019 CLINICAL DATA:  Fever and chills. Acute onset of dizziness. EXAM: PORTABLE CHEST 1 VIEW COMPARISON:  03/18/2019 FINDINGS: Heart size and pulmonary vascularity are normal. Aortic atherosclerosis. Chronic accentuation of the interstitial markings. No infiltrates or effusions. No acute bone abnormality. IMPRESSION: No acute cardiopulmonary abnormality. Chronic interstitial lung disease. Aortic atherosclerosis. Electronically Signed   By: Lorriane Shire M.D.   On: 04/12/2019 18:19     Medical Consultants:    None.  Anti-Infectives:   Received a single dose of IV Vanco and Flagyl on 04/12/2019  Subjective:    Dylan Goodman relates still having persistent diarrhea, still feels slightly short of breath.  Objective:    Vitals:   04/13/19 0208  BP: (!) 129/91  Pulse: 97  Resp: 20  Temp: (!) 96.9 F (36.1 C)  TempSrc: Axillary  SpO2: 96%  Weight: 97.5 kg  Height: 6' (1.829 m)   SpO2: 96 % O2 Flow Rate (L/min): 4 L/min   Intake/Output Summary (Last 24 hours) at 04/13/2019 0718 Last data filed at 04/13/2019 0500 Gross per 24 hour  Intake 243 ml  Output 350 ml  Net -107 ml   Filed Weights   04/13/19 0208  Weight: 97.5 kg    Exam: General exam:  In no acute distress. Respiratory system: Good air movement and diffuse crackles bilaterally. Cardiovascular system: S1 & S2 heard, RRR. No JVD. Gastrointestinal system: Abdomen is nondistended, soft and nontender.  Central nervous system: Alert and oriented. No focal neurological deficits. Extremities: No pedal edema. Skin: No rashes, lesions or ulcers Psychiatry: Judgement and insight appear normal. Mood & affect appropriate.   Data Reviewed:    Labs: Basic Metabolic Panel: Recent Labs  Lab 04/12/19 1658 04/13/19 0230  NA 131* 138  K 5.2* 3.7  CL 103 108  CO2 12* 14*  GLUCOSE 93 78  BUN 94* 85*  CREATININE 5.93* 5.03*  CALCIUM 5.0* 5.3*  MG 0.6* 0.8*   GFR Estimated Creatinine Clearance: 17.7 mL/min (A) (by C-G formula based on SCr of 5.03 mg/dL (H)). Liver Function Tests: Recent Labs  Lab 04/12/19 1658 04/13/19 0230  AST 50* 37  ALT 26 24  ALKPHOS 56 55  BILITOT 1.2 0.4  PROT 6.4* 5.8*  ALBUMIN 3.0* 2.9*   No results for input(s): LIPASE, AMYLASE in the last 168 hours. No results for input(s): AMMONIA in the last 168 hours. Coagulation profile No results for input(s): INR, PROTIME in the last 168 hours. [place]  04/13/19 0230  DDIMER 10.01*    Lab Results  Component Value Date   SARSCOV2NAA POSITIVE (A) 04/12/2019   [place] Not Detected 04/02/2019   SARSCOV2NAA NEGATIVE 03/12/2019   [place] NEGATIVE 03/06/2019    CBC: Recent Labs  Lab 04/12/19 1658  WBC 5.3  HGB 12.4*  HCT 38.0*  MCV 88.4  PLT 184   Cardiac Enzymes: No results for input(s): CKTOTAL, CKMB, CKMBINDEX, TROPONINI in the last 168 hours. BNP (last 3 results) No results for input(s): PROBNP in the last 8760 hours. CBG: No results for input(s): GLUCAP in the last 168 hours. D-Dimer: Recent Labs    04/13/19 0230  DDIMER 10.01*   Hgb A1c: No results for input(s): HGBA1C in the last 72 hours. Lipid Profile: No results for input(s): CHOL, HDL,  LDLCALC, TRIG, CHOLHDL, LDLDIRECT in the last 72 hours. Thyroid function studies: No results for input(s): TSH, T4TOTAL, T3FREE, THYROIDAB in the last 72 hours.  Invalid input(s): FREET3 Anemia work up: No results for input(s): VITAMINB12, FOLATE, FERRITIN, TIBC, IRON, RETICCTPCT in the last 72 hours. Sepsis Labs: Recent Labs  Lab 04/12/19 1658 04/12/19 1803 04/12/19 2214  WBC 5.3  --   --   LATICACIDVEN  --  1.0 0.5   Microbiology Recent Results (from the past 240 hour(s))  SARS Coronavirus 2 [facility] order, Performed in [facility] hospital lab) Nasopharyngeal Nasopharyngeal Swab     Status: Abnormal   Collection Time: 04/12/19  6:04 PM   Specimen: Nasopharyngeal Swab  Result Value Ref Range Status   SARS Coronavirus 2 POSITIVE (A) NEGATIVE Final    Comment: RESULT CALLED TO, READ BACK BY AND VERIFIED WITH: TOM NAGY AT 1950 04/12/2019 [facility] (NOTE) If result is NEGATIVE SARS-CoV-2 target nucleic acids are NOT DETECTED. The SARS-CoV-2 RNA is generally detectable in upper and lower  respiratory specimens during the acute phase of infection. The lowest  concentration of SARS-CoV-2 viral copies this assay can detect is 250  copies / mL. A negative result does not preclude SARS-CoV-2 infection  and should not be used as the sole basis for treatment or other  patient management decisions.  A negative result may occur with  improper specimen collection / handling, submission of specimen other  than nasopharyngeal swab, presence of viral mutation(s) within the  areas targeted by this assay, and inadequate number of viral copies  (<250 copies / mL). A negative result must be combined with clinical  observations, patient history, and epidemiological information. If result is POSITIVE SARS-CoV-2 target nucleic acids are DETECTED. The S ARS-CoV-2 RNA is generally detectable in upper and lower  respiratory specimens during the acute phase of infection.  Positive  results are indicative  of active infection with SARS-CoV-2.  Clinical  correlation with patient history and other diagnostic information is  necessary to determine patient infection status.  Positive results do  not rule out bacterial infection or co-infection with other viruses. If result is PRESUMPTIVE POSTIVE SARS-CoV-2 nucleic acids MAY BE PRESENT.   A presumptive positive result was obtained on the submitted specimen  and confirmed on repeat testing.  While 2019 novel coronavirus  (SARS-CoV-2) nucleic acids may be present in the submitted sample  additional confirmatory testing may be necessary for epidemiological  and / or clinical management purposes  to differentiate between  SARS-CoV-2 and other Sarbecovirus currently known to infect humans.  If clinically indicated additional testing with an alternate test  methodology [phone]) is adv ised. The SARS-CoV-2 RNA is generally  detectable in upper  and lower respiratory specimens during the acute  phase of infection. The expected result is Negative. Fact Sheet for Patients:  StrictlyIdeas.no Fact Sheet for Healthcare Providers: BankingDealers.co.za This test is not yet approved or cleared by the [place] FDA and has been authorized for detection and/or diagnosis of SARS-CoV-2 by FDA under an Emergency Use Authorization (EUA).  This EUA will remain in effect (meaning this test can be used) for the duration of the COVID-19 declaration under Section 564(b)(1) of the Act, 21 U.S.C. section 360bbb-3(b)(1), unless the authorization is terminated or revoked sooner. Performed at [facility], [address] [place], [place] [address]      Medications:   . apixaban  2.5 mg Oral BID  . carvedilol  6.25 mg Oral BID  . cilostazol  100 mg Oral BID  . dexamethasone  6 mg Oral QHS  . insulin aspart  0-5 Units Subcutaneous QHS  . insulin aspart  0-9 Units Subcutaneous TID WC  . isosorbide  mononitrate  120 mg Oral Daily  . magnesium oxide  400 mg Oral Once  . mouth rinse  15 mL Mouth Rinse BID  . pantoprazole  40 mg Oral Daily  . pravastatin  10 mg Oral q1800  . sodium chloride flush  3 mL Intravenous Q12H  . ticagrelor  60 mg Oral BID   Continuous Infusions: .  sodium bicarbonate (isotonic) infusion in sterile water 100 mL/hr at 04/13/19 0315     LOS: 0 days   Charlynne Cousins  [facility]  04/13/2019, 7:18 AM

## 2019-04-13 NOTE — Progress Notes (Addendum)
Informed by lab about pt's critical Ca and magnesium.  Dr. Venetia Constable paged about pt's critical lab and new orders were placed for replacement.

## 2019-04-14 ENCOUNTER — Encounter: Payer: Medicare Other | Admitting: Physical Therapy

## 2019-04-14 LAB — COMPREHENSIVE METABOLIC PANEL
ALT: 24 U/L (ref 0–44)
AST: 37 U/L (ref 15–41)
Albumin: 2.8 g/dL — ABNORMAL LOW (ref 3.5–5.0)
Alkaline Phosphatase: 57 U/L (ref 38–126)
Anion gap: 14 (ref 5–15)
BUN: 73 mg/dL — ABNORMAL HIGH (ref 8–23)
CO2: 20 mmol/L — ABNORMAL LOW (ref 22–32)
Calcium: 5.6 mg/dL — CL (ref 8.9–10.3)
Chloride: 105 mmol/L (ref 98–111)
Creatinine, Ser: 3.52 mg/dL — ABNORMAL HIGH (ref 0.61–1.24)
GFR calc Af Amer: 20 mL/min — ABNORMAL LOW (ref >60)
GFR calc non Af Amer: 17 mL/min — ABNORMAL LOW (ref >60)
Glucose, Bld: 194 mg/dL — ABNORMAL HIGH (ref 70–99)
Potassium: 3.3 mmol/L — ABNORMAL LOW (ref 3.5–5.1)
Sodium: 139 mmol/L (ref 135–145)
Total Bilirubin: 0.3 mg/dL (ref 0.3–1.2)
Total Protein: 5.9 g/dL — ABNORMAL LOW (ref 6.5–8.1)

## 2019-04-14 LAB — CBC WITH DIFFERENTIAL/PLATELET
Abs Immature Granulocytes: 0.02 10*3/uL (ref 0.00–0.07)
Basophils Absolute: 0 10*3/uL (ref 0.0–0.1)
Basophils Relative: 0 %
Eosinophils Absolute: 0 10*3/uL (ref 0.0–0.5)
Eosinophils Relative: 0 %
HCT: 36.2 % — ABNORMAL LOW (ref 39.0–52.0)
Hemoglobin: 11.9 g/dL — ABNORMAL LOW (ref 13.0–17.0)
Immature Granulocytes: 1 %
Lymphocytes Relative: 12 %
Lymphs Abs: 0.4 10*3/uL — ABNORMAL LOW (ref 0.7–4.0)
MCH: 29.4 pg (ref 26.0–34.0)
MCHC: 32.9 g/dL (ref 30.0–36.0)
MCV: 89.4 fL (ref 80.0–100.0)
Monocytes Absolute: 0.1 10*3/uL (ref 0.1–1.0)
Monocytes Relative: 4 %
Neutro Abs: 2.6 10*3/uL (ref 1.7–7.7)
Neutrophils Relative %: 83 %
Platelets: 167 10*3/uL (ref 150–400)
RBC: 4.05 MIL/uL — ABNORMAL LOW (ref 4.22–5.81)
RDW: 14.1 % (ref 11.5–15.5)
WBC: 3.1 10*3/uL — ABNORMAL LOW (ref 4.0–10.5)
nRBC: 0 % (ref 0.0–0.2)

## 2019-04-14 LAB — GLUCOSE, CAPILLARY
Glucose-Capillary: 262 mg/dL — ABNORMAL HIGH (ref 70–99)
Glucose-Capillary: 285 mg/dL — ABNORMAL HIGH (ref 70–99)
Glucose-Capillary: 267 mg/dL — ABNORMAL HIGH (ref 70–99)
Glucose-Capillary: 301 mg/dL — ABNORMAL HIGH (ref 70–99)

## 2019-04-14 LAB — MAGNESIUM
Magnesium: 0.6 mg/dL — CL (ref 1.7–2.4)
Magnesium: 1.9 mg/dL (ref 1.7–2.4)

## 2019-04-14 LAB — D-DIMER, QUANTITATIVE: D-Dimer, Quant: 9.32 ug/mL-FEU — ABNORMAL HIGH (ref 0.00–0.50)

## 2019-04-14 LAB — C-REACTIVE PROTEIN: CRP: 10.9 mg/dL — ABNORMAL HIGH (ref <1.0)

## 2019-04-14 LAB — FERRITIN: Ferritin: 809 ng/mL — ABNORMAL HIGH (ref 24–336)

## 2019-04-14 LAB — PROCALCITONIN: Procalcitonin: 0.77 ng/mL

## 2019-04-14 MED ORDER — POTASSIUM CHLORIDE 10 MEQ/100ML IV SOLN
10.0000 meq | INTRAVENOUS | Status: AC
  Administered 2019-04-14 (×4): 10 meq via INTRAVENOUS
  Filled 2019-04-14 (×2): qty 100

## 2019-04-14 MED ORDER — SODIUM CHLORIDE 0.9 % IV SOLN
INTRAVENOUS | Status: AC
  Administered 2019-04-14: 11:00:00 via INTRAVENOUS

## 2019-04-14 MED ORDER — SODIUM CHLORIDE 0.9 % IV SOLN: INTRAVENOUS | Status: DC

## 2019-04-14 MED ORDER — MAGNESIUM OXIDE 400 (241.3 MG) MG PO TABS
400.0000 mg | ORAL_TABLET | Freq: Two times a day (BID) | ORAL | Status: AC
  Administered 2019-04-14: 400 mg via ORAL
  Filled 2019-04-14 (×2): qty 1

## 2019-04-14 MED ORDER — INSULIN DETEMIR 100 UNIT/ML ~~LOC~~ SOLN
10.0000 [IU] | Freq: Two times a day (BID) | SUBCUTANEOUS | Status: DC
  Administered 2019-04-14 – 2019-04-18 (×9): 10 [IU] via SUBCUTANEOUS
  Filled 2019-04-14 (×10): qty 0.1

## 2019-04-14 MED ORDER — MAGNESIUM SULFATE 4 GM/100ML IV SOLN: 4.0000 g | Freq: Once | INTRAVENOUS | Status: DC

## 2019-04-14 MED ORDER — MAGNESIUM SULFATE 4 GM/100ML IV SOLN
4.0000 g | Freq: Two times a day (BID) | INTRAVENOUS | Status: AC
  Administered 2019-04-14 (×2): 4 g via INTRAVENOUS
  Filled 2019-04-14 (×2): qty 100

## 2019-04-14 MED ORDER — CALCIUM CARBONATE 1250 (500 CA) MG PO TABS
1.0000 | ORAL_TABLET | Freq: Three times a day (TID) | ORAL | Status: AC
  Administered 2019-04-14 – 2019-04-15 (×6): 500 mg via ORAL
  Filled 2019-04-14 (×6): qty 1

## 2019-04-14 NOTE — Progress Notes (Signed)
Occupational Therapy Evaluation Patient Details Name: Dylan Goodman MRN: [id number] DOB: 06-11-1951 Today's Date: 04/14/2019    History of Present Illness 68 y.o. male past medical history significant for coronary artery disease, chronic systolic heart failure with an EF of 35%, insulin-dependent diabetes mellitus, chronic diarrhea, atrial fibrillation on Eliquis and chronic kidney disease stage IV (with a baseline creatinine of 2.6), who presents to the ED for lightheadedness worsening diarrhea cough and shortness of breath. In the ED was found to have a temperature 101 and noted to be hypoxic was placed on 2 L of oxygen.   Clinical Impression   PTA,  Pt lived at home with his wife and family and was independent with ADL and mobility. Completed session with pt ambulating to bathroom, completed sink level ADL and returned to bed/chair. SpO2 in 90s on RA with 1/4 DOE. Pt states he feels much better today. Nsg aware pt on RA. Will follow acutely but do not anticipate OT needs after DC. Pt very appreciative.     Follow Up Recommendations  No OT follow up;Supervision - Intermittent    Equipment Recommendations  3 in 1 bedside commode(for shower seat)    Recommendations for Other Services       Precautions / Restrictions Precautions Precautions: Fall      Mobility Bed Mobility Overal bed mobility: Modified Independent                Transfers Overall transfer level: Needs assistance   Transfers: Sit to/from Stand;Stand Pivot Transfers Sit to Stand: Min guard Stand pivot transfers: Min guard            Balance Overall balance assessment: Needs assistance   Sitting balance-Leahy Scale: Good       Standing balance-Leahy Scale: Fair Standing balance comment: used IV to stabilize                           ADL either performed or assessed with clinical judgement   ADL Overall ADL's : Needs assistance/impaired     Grooming: Standing;Min guard    Upper Body Bathing: Min guard;Standing   Lower Body Bathing: Min guard;Sit to/from stand   Upper Body Dressing : Set up;Sitting   Lower Body Dressing: Min guard;Sit to/from stand   Toilet Transfer: Min guard;Ambulation Toilet Transfer Details (indicate cue type and reason): Pt holding IV pole - mild unsteadiness  Toileting- Clothing Manipulation and Hygiene: Supervision/safety       Functional mobility during ADLs: Min guard(Use of IV pole)       Vision         Perception     Praxis      Pertinent Vitals/Pain Pain Assessment: No/denies pain     Hand Dominance Right   Extremity/Trunk Assessment Upper Extremity Assessment Upper Extremity Assessment: Overall WFL for tasks assessed   Lower Extremity Assessment Lower Extremity Assessment: Defer to PT evaluation   Cervical / Trunk Assessment Cervical / Trunk Assessment: Normal   Communication Communication Communication: HOH   Cognition Arousal/Alertness: Awake/alert Behavior During Therapy: WFL for tasks assessed/performed Overall Cognitive Status: Within Functional Limits for tasks assessed                                     General Comments  Brighter affect after pt cleaned up at sink and was joking around with therapists Enjoys fishing; trying to grow a  ponytail   Exercises     Shoulder Instructions      Home Living Family/patient expects to be discharged to:: Private residence Living Arrangements: Spouse/significant other;Children Available Help at Discharge: Family;Available 24 hours/day Type of Home: House Home Access: Level entry     Home Layout: Two level Alternate Level Stairs-Number of Steps: flight, 16 steps   Bathroom Shower/Tub: Tub/shower unit;Curtain   Bathroom Toilet: Standard Bathroom Accessibility: Yes How Accessible: Accessible via walker Home Equipment: Grab bars - toilet;Walker - 2 wheels          Prior Functioning/Environment Level of Independence:  Independent with assistive device(s)        Comments: reports dizzy spells, occaisonal RW use but not often        OT Problem List: Decreased strength;Decreased activity tolerance      OT Treatment/Interventions: Self-care/ADL training;Therapeutic exercise;Energy conservation;DME and/or AE instruction;Therapeutic activities;Patient/family education    OT Goals(Current goals can be found in the care plan section) Acute Rehab OT Goals Patient Stated Goal: to get back home adn fish OT Goal Formulation: With patient Time For Goal Achievement: 04/28/19 Potential to Achieve Goals: Good  OT Frequency: Min 3X/week   Barriers to D/C:            Co-evaluation              AM-PAC OT "6 Clicks" Daily Activity     Outcome Measure Help from another person eating meals?: None Help from another person taking care of personal grooming?: A Little Help from another person toileting, which includes using toliet, bedpan, or urinal?: A Little Help from another person bathing (including washing, rinsing, drying)?: A Little Help from another person to put on and taking off regular upper body clothing?: A Little Help from another person to put on and taking off regular lower body clothing?: A Little 6 Click Score: 19   End of Session Nurse Communication: Mobility status  Activity Tolerance: Patient tolerated treatment well Patient left: in chair;with call bell/phone within reach  OT Visit Diagnosis: Unsteadiness on feet (R26.81);Muscle weakness (generalized) (M62.81)                Time: 8616-8372 OT Time Calculation (min): 36 min Charges:  OT General Charges $OT Visit: 1 Visit OT Evaluation $OT Eval Moderate Complexity: 1 Mod OT Treatments $Self Care/Home Management : 8-22 mins    Huck Ashworth,HILLARY 04/14/2019, 1:29 PM  Maurie Boettcher, OT/L   Acute OT Clinical Specialist Acute Rehabilitation Services Pager [phone] Office [phone]

## 2019-04-14 NOTE — Progress Notes (Signed)
Pt. Wife called, no answer, left a message to return call if she needed to ask any questions. Pt. Has been in contact with wife all day on personal cell phone.

## 2019-04-14 NOTE — Progress Notes (Signed)
Physical Therapy cancellation    04/14/19 1100  PT Visit Information  Last PT Received On 04/14/19  Reason Eval/Treat Not Completed Patient at procedure or test/unavailable   Patient with OT. Will see as schedule permits.    Barry Brunner, PT

## 2019-04-14 NOTE — Progress Notes (Signed)
[facility] PROGRESS NOTE    Progress Note  Tyrann Donaho  WYO:[id number] DOB: 1951/02/23 DOA: 04/13/2019 PCP: Steele Sizer, MD     Brief Narrative:   Crist Kruszka is an 68 y.o. male past medical history significant for coronary artery disease, chronic systolic heart failure with an EF of 35%, insulin-dependent diabetes mellitus, chronic diarrhea, atrial fibrillation on Eliquis and chronic kidney disease stage IV (with a baseline creatinine of 2.6), who presents to the ED for lightheadedness worsening diarrhea cough and shortness of breath.  Patient reports that his diarrhea has been progressively getting worst over the last several days to the point where he felt he might pass out.  In the ED was found to have a temperature 101 and noted to be hypoxic was placed on 2 L of oxygen.  He was also found tachycardic with a borderline low blood pressure.  Assessment/Plan:   Acute renal failure superimposed on stage 4 chronic kidney disease/metabolic acidosis and hyperkalemia: Likely prerenal azotemia in the setting of the decreased oral intake, ongoing worsening diarrhea and losartan use.  Creatinine on admission was 6. Baseline around 2.6 ARB was held on admission he was given almost 3 L nasal saline in the ED with a bicarbonate infusion, we have discontinue bicarbonate infusion, his creatinine today is 3.5. We will let him stabilize recheck a basic metabolic panel in the morning.  Acute respiratory failure with hypoxia due to COVID-19 viral infection: After household contacts he developed fever, chills and worsening diarrhea. He is currently requiring 4 L supplemental oxygen to keep saturations above 92% He was started empirically on IV Remdesivir and steroids he got a single dose of IV Vanco and Flagyl in the ED which has now been stopped.  Is 1.2, repeated today 0.7. His inflammatory markers were elevated on admission and slowly trending down. His chest x-ray from  04/12/2019 showed no infiltrates, it was repeated on 14 2020 after IV hydration and is unchanged from previous.  Hypovolemic hyponatremia: Resolved with IV fluid hydration.  Metabolic non-anion gap acidosis: Likely due to gastrointestinal losses it is improved with a bicarbonate infusion.  Bicarbonate is 20 we will discontinue IV bicarbonate.  Hypomagnesemia/hypocalcemia: His potassium and his magnesium along with his calcium are probably significantly depleted due to his ongoing diarrhea which is been worsened by his COVID-19 viral infection over the last several days, this was repleted IV and orally with minimal improvement. We will replete IV more aggressively today and recheck in the afternoon. Recheck all labs tomorrow morning.  Chronic systolic heart failure: He appears to be compensated his last echo was done on August 2020 with an EF of 35%. We are currently holding his Imdur and ARB, continue his Coreg. Continue strict I's and O's and daily weights. According to the computer he is about 1800 cc negative, concerned about I's and O's poorly recorded.  We will give him gentle hydration recheck labs in the morning.  CAD: Continue Brilinta statin, Imdur and beta-blockers. He has no current planes of chest pain.  Insulin-dependent diabetes mellitus type 2: With an A1c of 6.2 Continue CBGs before meals and at bedtime, continue sensitive scale sliding scale.  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 Which is likely due to his COVID-19 viral infection.  Chronic atrial fibrillation: CHADS-VASc is at least 4. Continue Eliquis.   DVT prophylaxis: Eliquis Family Communication:none Disposition Plan/Barrier to D/C:  unable to determine Code Status:     Code Status Orders  (From admission, onward)         Start     Ordered   04/13/19 0232  Full code  Continuous      04/13/19 0236        Code Status History    Date Active Date Inactive Code Status Order ID Comments User Context   03/12/2019 1725 03/13/2019 1933 Full Code [id number]  Gladstone Lighter, MD Inpatient   Advance Care Planning Activity        IV Access:    Peripheral IV   Procedures and diagnostic studies:   Ct Head Wo Contrast  Result Date: 04/12/2019 CLINICAL DATA:  Ataxia. Sudden onset of dizziness today. EXAM: CT HEAD WITHOUT CONTRAST TECHNIQUE: Contiguous axial images were obtained from the base of the skull through the vertex without intravenous contrast. COMPARISON:  MRI of the brain dated 03/12/2019 FINDINGS: Brain: No evidence of acute infarction, hemorrhage, hydrocephalus, extra-axial collection or mass lesion/mass effect. Mild diffuse atrophy. Vascular: No hyperdense vessel. Dense calcification in the distal vertebral arteries. Skull: Normal. Negative for fracture or focal lesion. Sinuses/Orbits: No acute finding. Other: None IMPRESSION: No acute abnormalities. Mild diffuse atrophy. Dense calcification in the distal vertebral arteries. Electronically Signed   By: Lorriane Shire M.D.   On: 04/12/2019 17:56   US Renal  Result Date: 04/13/2019 CLINICAL DATA:  Acute on chronic renal failure. EXAM: RENAL / URINARY TRACT ULTRASOUND COMPLETE COMPARISON:  None. FINDINGS: Right Kidney: Renal measurements: 10.3 x 6.4 x 5.4 cm = volume: 188 mL. Slightly increased echogenicity of renal parenchyma is noted. No mass or hydronephrosis visualized. Left Kidney: Renal measurements: 12.5 x 6.2 x 5.6 cm = volume: 227 mL. Slightly increased echogenicity of renal parenchyma is noted. No mass or hydronephrosis visualized. Bladder: Appears normal for degree of bladder distention. Bilateral ureteral jets are noted. IMPRESSION: Slightly increased echogenicity of renal parenchyma is noted bilaterally suggesting medical renal disease. No hydronephrosis or renal obstruction is noted. Electronically Signed    By: Marijo Conception M.D.   On: 04/13/2019 13:36   Dg Chest Port 1 View  Result Date: 04/13/2019 CLINICAL DATA:  68 year old male with a history of dyspnea EXAM: PORTABLE CHEST 1 VIEW COMPARISON:  April 12, 2019, CT March 24, 2019 FINDINGS: Cardiomediastinal silhouette unchanged in size and contour. No evidence of central vascular congestion. Surgical changes of median sternotomy. Similar appearance of reticulonodular opacities of the bilateral lungs with no new confluent airspace disease pneumothorax or pleural effusion. IMPRESSION: Similar appearance of chronic interstitial opacities favored to represent chronic lung disease, however, superimposed atypical infection cannot be excluded. No evidence of lobar pneumonia. Surgical changes of median sternotomy and CABG Electronically Signed   By: Corrie Mckusick D.O.   On: 04/13/2019 08:35   Dg Chest Portable 1 View  Result Date: 04/12/2019 CLINICAL DATA:  Fever and chills. Acute onset of dizziness. EXAM: PORTABLE CHEST 1 VIEW COMPARISON:  03/18/2019 FINDINGS: Heart size and pulmonary vascularity are normal. Aortic atherosclerosis. Chronic accentuation of the interstitial markings. No infiltrates or effusions. No acute bone abnormality. IMPRESSION: No acute cardiopulmonary abnormality. Chronic interstitial lung disease. Aortic atherosclerosis. Electronically Signed   By: Lorriane Shire M.D.   On: 04/12/2019 18:19     Medical Consultants:    None.  Anti-Infectives:   Received a single dose of IV Vanco and Flagyl on 04/12/2019  Subjective:    Oswin Griffith feels better today still short of breath with  ambulation.  Objective:    Vitals:   04/14/19 0400 04/14/19 0415 04/14/19 0500 04/14/19 0600  BP:  108/68    Pulse: 97 (!) 101 (!) 103 (!) 121  Resp: 17 15 16 20   Temp:  98.2 F (36.8 C)    TempSrc:  Oral    SpO2: 96% 94% 94% 92%  Weight:      Height:       SpO2: 92 % O2 Flow Rate (L/min): 4 L/min   Intake/Output Summary  (Last 24 hours) at 04/14/2019 0746 Last data filed at 04/14/2019 0000 Gross per 24 hour  Intake 721.81 ml  Output 2500 ml  Net -1778.19 ml   Filed Weights   04/13/19 0208  Weight: 97.5 kg    Exam: General exam: In no acute distress. Respiratory system: Good air movement and diffuse crackles bilaterally. Cardiovascular system: S1 & S2 heard, RRR. No JVD. Gastrointestinal system: Abdomen is nondistended, soft and nontender.  Central nervous system: Alert and oriented. No focal neurological deficits. Extremities: No pedal edema. Skin: No rashes, lesions or ulcers Psychiatry: Judgement and insight appear normal. Mood & affect appropriate.    Data Reviewed:    Labs: Basic Metabolic Panel: Recent Labs  Lab 04/12/19 1658 04/13/19 0230 04/14/19 0215  NA 131* 138 139  K 5.2* 3.7 3.3*  CL 103 108 105  CO2 12* 14* 20*  GLUCOSE 93 78 194*  BUN 94* 85* 73*  CREATININE 5.93* 5.03* 3.52*  CALCIUM 5.0* 5.3* 5.6*  MG 0.6* 0.8* 0.6*   GFR Estimated Creatinine Clearance: 25.3 mL/min (A) (by C-G formula based on SCr of 3.52 mg/dL (H)). Liver Function Tests: Recent Labs  Lab 04/12/19 1658 04/13/19 0230 04/14/19 0215  AST 50* 37 37  ALT 26 24 24   ALKPHOS 56 55 57  BILITOT 1.2 0.4 0.3  PROT 6.4* 5.8* 5.9*  ALBUMIN 3.0* 2.9* 2.8*   No results for input(s): LIPASE, AMYLASE in the last 168 hours. No results for input(s): AMMONIA in the last 168 hours. Coagulation profile No results for input(s): INR, PROTIME in the last 168 hours. COVID-19 Labs  Recent Labs    04/13/19 0230 04/14/19 0215  DDIMER 10.01* 9.32*  FERRITIN 859* 809*  CRP 11.9* 10.9*    Lab Results  Component Value Date   SARSCOV2NAA POSITIVE (A) 04/12/2019   [place] Not Detected 04/02/2019   SARSCOV2NAA NEGATIVE 03/12/2019   [place] NEGATIVE 03/06/2019    CBC: Recent Labs  Lab 04/12/19 1658 04/14/19 0215  WBC 5.3 3.1*  NEUTROABS  --  2.6  HGB 12.4* 11.9*  HCT 38.0* 36.2*  MCV 88.4 89.4   PLT 184 167   Cardiac Enzymes: No results for input(s): CKTOTAL, CKMB, CKMBINDEX, TROPONINI in the last 168 hours. BNP (last 3 results) No results for input(s): PROBNP in the last 8760 hours. CBG: Recent Labs  Lab 04/13/19 0915 04/13/19 1144 04/13/19 1617 04/13/19 2026  GLUCAP 106* 221* 201* 262*   D-Dimer: Recent Labs    04/13/19 0230 04/14/19 0215  DDIMER 10.01* 9.32*   Hgb A1c: No results for input(s): HGBA1C in the last 72 hours. Lipid Profile: No results for input(s): CHOL, HDL, LDLCALC, TRIG, CHOLHDL, LDLDIRECT in the last 72 hours. Thyroid function studies: No results for input(s): TSH, T4TOTAL, T3FREE, THYROIDAB in the last 72 hours.  Invalid input(s): FREET3 Anemia work up: Recent Labs    04/13/19 0230 04/14/19 0215  FERRITIN 859* 809*   Sepsis Labs: Recent Labs  Lab 04/12/19 1658 04/12/19 1803 04/12/19 2214  04/13/19 0230 04/14/19 0215  PROCALCITON  --   --   --  1.28 0.77  WBC 5.3  --   --   --  3.1*  LATICACIDVEN  --  1.0 0.5  --   --    Microbiology Recent Results (from the past 240 hour(s))  SARS Coronavirus 2 [facility] order, Performed in [facility] hospital lab) Nasopharyngeal Nasopharyngeal Swab     Status: Abnormal   Collection Time: 04/12/19  6:04 PM   Specimen: Nasopharyngeal Swab  Result Value Ref Range Status   SARS Coronavirus 2 POSITIVE (A) NEGATIVE Final    Comment: RESULT CALLED TO, READ BACK BY AND VERIFIED WITH: TOM NAGY AT 1950 04/12/2019 [facility] (NOTE) If result is NEGATIVE SARS-CoV-2 target nucleic acids are NOT DETECTED. The SARS-CoV-2 RNA is generally detectable in upper and lower  respiratory specimens during the acute phase of infection. The lowest  concentration of SARS-CoV-2 viral copies this assay can detect is 250  copies / mL. A negative result does not preclude SARS-CoV-2 infection  and should not be used as the sole basis for treatment or other  patient management decisions.  A negative result may occur with   improper specimen collection / handling, submission of specimen other  than nasopharyngeal swab, presence of viral mutation(s) within the  areas targeted by this assay, and inadequate number of viral copies  (<250 copies / mL). A negative result must be combined with clinical  observations, patient history, and epidemiological information. If result is POSITIVE SARS-CoV-2 target nucleic acids are DETECTED. The S ARS-CoV-2 RNA is generally detectable in upper and lower  respiratory specimens during the acute phase of infection.  Positive  results are indicative of active infection with SARS-CoV-2.  Clinical  correlation with patient history and other diagnostic information is  necessary to determine patient infection status.  Positive results do  not rule out bacterial infection or co-infection with other viruses. If result is PRESUMPTIVE POSTIVE SARS-CoV-2 nucleic acids MAY BE PRESENT.   A presumptive positive result was obtained on the submitted specimen  and confirmed on repeat testing.  While 2019 novel coronavirus  (SARS-CoV-2) nucleic acids may be present in the submitted sample  additional confirmatory testing may be necessary for epidemiological  and / or clinical management purposes  to differentiate between  SARS-CoV-2 and other Sarbecovirus currently known to infect humans.  If clinically indicated additional testing with an alternate test  methodology [phone]) is adv ised. The SARS-CoV-2 RNA is generally  detectable in upper and lower respiratory specimens during the acute  phase of infection. The expected result is Negative. Fact Sheet for Patients:  StrictlyIdeas.no Fact Sheet for Healthcare Providers: BankingDealers.co.za This test is not yet approved or cleared by the [place] FDA and has been authorized for detection and/or diagnosis of SARS-CoV-2 by FDA under an Emergency Use Authorization (EUA).  This EUA will  remain in effect (meaning this test can be used) for the duration of the COVID-19 declaration under Section 564(b)(1) of the Act, 21 U.S.C. section 360bbb-3(b)(1), unless the authorization is terminated or revoked sooner. Performed at [facility], [address] [place],  [address]   Blood culture (routine x 2)     Status: None (Preliminary result)   Collection Time: 04/12/19  7:01 PM   Specimen: BLOOD  Result Value Ref Range Status   Specimen Description BLOOD LEFT HAND  Final   Special Requests   Final    BOTTLES DRAWN AEROBIC AND ANAEROBIC Blood Culture  results may not be optimal due to an inadequate volume of blood received in culture bottles   Culture   Final    NO GROWTH 2 DAYS Performed at [facility], [address] [place], [place] [address]    Report Status PENDING  Incomplete  Blood culture (routine x 2)     Status: None (Preliminary result)   Collection Time: 04/12/19  7:01 PM   Specimen: BLOOD  Result Value Ref Range Status   Specimen Description BLOOD LEFT ASSIST CONTROL  Final   Special Requests   Final    BOTTLES DRAWN AEROBIC AND ANAEROBIC Blood Culture results may not be optimal due to an excessive volume of blood received in culture bottles   Culture   Final    NO GROWTH 2 DAYS Performed at [facility], [address] [place]  [address]    Report Status PENDING  Incomplete     Medications:   . apixaban  5 mg Oral BID  . carvedilol  6.25 mg Oral BID  . cilostazol  100 mg Oral BID  . dexamethasone  12 mg Oral QHS  . insulin aspart  0-5 Units Subcutaneous QHS  . insulin aspart  0-9 Units Subcutaneous TID WC  . mouth rinse  15 mL Mouth Rinse BID  . pantoprazole  40 mg Oral Daily  . pravastatin  10 mg Oral q1800  . sodium chloride flush  3 mL Intravenous Q12H  . ticagrelor  60 mg Oral BID   Continuous Infusions: . remdesivir 100 mg in NS 250 mL       LOS: 1 day   Charlynne Cousins  [facility]  04/14/2019, 7:46 AM

## 2019-04-15 ENCOUNTER — Ambulatory Visit: Admission: RE | Admit: 2019-04-15 | Payer: Medicare Other | Source: Ambulatory Visit

## 2019-04-15 DIAGNOSIS — R079 Chest pain, unspecified: Secondary | ICD-10-CM

## 2019-04-15 LAB — COMPREHENSIVE METABOLIC PANEL
ALT: 24 U/L (ref 0–44)
AST: 37 U/L (ref 15–41)
Albumin: 2.7 g/dL — ABNORMAL LOW (ref 3.5–5.0)
Alkaline Phosphatase: 55 U/L (ref 38–126)
Anion gap: 12 (ref 5–15)
BUN: 70 mg/dL — ABNORMAL HIGH (ref 8–23)
CO2: 19 mmol/L — ABNORMAL LOW (ref 22–32)
Calcium: 6.2 mg/dL — CL (ref 8.9–10.3)
Chloride: 105 mmol/L (ref 98–111)
Creatinine, Ser: 2.86 mg/dL — ABNORMAL HIGH (ref 0.61–1.24)
GFR calc Af Amer: 26 mL/min — ABNORMAL LOW (ref >60)
GFR calc non Af Amer: 22 mL/min — ABNORMAL LOW (ref >60)
Glucose, Bld: 340 mg/dL — ABNORMAL HIGH (ref 70–99)
Potassium: 3.5 mmol/L (ref 3.5–5.1)
Sodium: 136 mmol/L (ref 135–145)
Total Bilirubin: 0.5 mg/dL (ref 0.3–1.2)
Total Protein: 5.5 g/dL — ABNORMAL LOW (ref 6.5–8.1)

## 2019-04-15 LAB — FERRITIN: Ferritin: 906 ng/mL — ABNORMAL HIGH (ref 24–336)

## 2019-04-15 LAB — PHOSPHORUS: Phosphorus: 2.4 mg/dL — ABNORMAL LOW (ref 2.5–4.6)

## 2019-04-15 LAB — GLUCOSE, CAPILLARY
Glucose-Capillary: 343 mg/dL — ABNORMAL HIGH (ref 70–99)
Glucose-Capillary: 284 mg/dL — ABNORMAL HIGH (ref 70–99)
Glucose-Capillary: 194 mg/dL — ABNORMAL HIGH (ref 70–99)
Glucose-Capillary: 224 mg/dL — ABNORMAL HIGH (ref 70–99)

## 2019-04-15 LAB — MAGNESIUM: Magnesium: 2 mg/dL (ref 1.7–2.4)

## 2019-04-15 LAB — PROCALCITONIN: Procalcitonin: 0.46 ng/mL

## 2019-04-15 LAB — D-DIMER, QUANTITATIVE: D-Dimer, Quant: 7.4 ug/mL-FEU — ABNORMAL HIGH (ref 0.00–0.50)

## 2019-04-15 LAB — TROPONIN I (HIGH SENSITIVITY)
Troponin I (High Sensitivity): 41 ng/L — ABNORMAL HIGH (ref <18)
Troponin I (High Sensitivity): 39 ng/L — ABNORMAL HIGH (ref <18)

## 2019-04-15 LAB — C-REACTIVE PROTEIN: CRP: 6.2 mg/dL — ABNORMAL HIGH (ref <1.0)

## 2019-04-15 MED ORDER — INSULIN ASPART 100 UNIT/ML ~~LOC~~ SOLN
4.0000 [IU] | Freq: Three times a day (TID) | SUBCUTANEOUS | Status: DC
  Administered 2019-04-15 – 2019-04-18 (×9): 4 [IU] via SUBCUTANEOUS

## 2019-04-15 MED ORDER — ISOSORBIDE DINITRATE 40 MG PO TABS
40.0000 mg | ORAL_TABLET | Freq: Three times a day (TID) | ORAL | Status: DC
  Administered 2019-04-15 – 2019-04-18 (×8): 40 mg via ORAL
  Filled 2019-04-15 (×12): qty 1

## 2019-04-15 MED ORDER — ISOSORBIDE MONONITRATE ER 30 MG PO TB24: 120.0000 mg | ORAL_TABLET | Freq: Every day | ORAL | Status: DC

## 2019-04-15 MED ORDER — CALCIUM GLUCONATE-NACL 1-0.675 GM/50ML-% IV SOLN
1.0000 g | Freq: Once | INTRAVENOUS | Status: AC
  Administered 2019-04-15: 1000 mg via INTRAVENOUS
  Filled 2019-04-15: qty 50

## 2019-04-15 MED ORDER — DEXAMETHASONE 6 MG PO TABS
6.0000 mg | ORAL_TABLET | Freq: Every day | ORAL | Status: DC
  Administered 2019-04-15 – 2019-04-16 (×2): 6 mg via ORAL
  Filled 2019-04-15 (×2): qty 1

## 2019-04-15 NOTE — Progress Notes (Signed)
Inpatient Diabetes Program Recommendations  AACE/ADA: New Consensus Statement on Inpatient Glycemic Control (2015)  Target Ranges:  Prepandial:   less than 140 mg/dL      Peak postprandial:   less than 180 mg/dL (1-2 hours)      Critically ill patients:  140 - 180 mg/dL   Lab Results  Component Value Date   GLUCAP 343 (H) 04/15/2019   HGBA1C 6.7 (H) 03/12/2019    Review of Glycemic Control Results for Dylan Goodman, Dylan Goodman (MRN [id number]) as of 04/15/2019 09:58  Ref. Range 04/13/2019 20:26 04/14/2019 08:11 04/14/2019 13:10 04/14/2019 21:32 04/15/2019 07:17  Glucose-Capillary Latest Ref Range: 70 - 99 mg/dL 262 (H) 285 (H) 267 (H) 301 (H) 343 (H)   Diabetes history: DM 2 Outpatient Diabetes medications:  Tresiba 12 units q HS Current orders for Inpatient glycemic control:  Novolog sensitive tid with meals and HS Levemir 10 units bid Decadron 12 mg q HS   Inpatient Diabetes Program Recommendations:    Please consider increasing Levemir to 15 units bid.  Also please add Novolog meal coverage 5 units tid with meals.   Thanks,  Adah Perl, RN, BC-ADM Inpatient Diabetes Coordinator Pager [phone] (8a-5p)

## 2019-04-15 NOTE — Evaluation (Signed)
Physical Therapy Evaluation Patient Details Name: Dylan Goodman MRN: [id number] DOB: 09-19-1950 Today's Date: 04/15/2019   History of Present Illness  68 y.o. 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 He reports to household contacts that have had COVID 19. + for COVID-19    Clinical Impression  Pt admitted with above diagnosis. Patient limited with mobility due to weakness and episode of chest pain while walking in room. RN in to assess patient and place on telemetry (pt was sats only on monitor). She communicated with MD and was performing EKG on PT departure. Further balance and strength training to follow as medically appropriate.  Pt currently with functional limitations due to the deficits listed below (see PT Problem List). Pt will benefit from skilled PT to increase their independence and safety with mobility to allow discharge to the venue listed below.       Follow Up Recommendations Home health PT;Supervision - Intermittent    Equipment Recommendations  None recommended by PT    Recommendations for Other Services       Precautions / Restrictions Precautions Precautions: Fall      Mobility  Bed Mobility Overal bed mobility: Modified Independent             General bed mobility comments: sitting EOB on arrival  Transfers Overall transfer level: Needs assistance Equipment used: 1 person hand held assist Transfers: Sit to/from Stand;Stand Pivot Transfers Sit to Stand: Min guard         General transfer comment: pt reports legs feel weak and reaching for UE  support  Ambulation/Gait Ambulation/Gait assistance: Min assist Gait Distance (Feet): 25 Feet Assistive device: IV Pole Gait Pattern/deviations: Step-through pattern;Decreased stride length;Wide base of support Gait velocity: slow   General Gait Details: pt reports feeling shakey; returned to sit EOB and pt reported mild chest pain (see details above)  Stairs            Wheelchair Mobility    Modified Rankin (Stroke Patients Only)       Balance Overall balance assessment: Needs assistance   Sitting balance-Leahy Scale: Good     Standing balance support: Single extremity supported Standing balance-Leahy Scale: Poor Standing balance comment: used IV to stabilize                             Pertinent Vitals/Pain Pain Assessment: No/denies pain    Home Living Family/patient expects to be discharged to:: Private residence Living Arrangements: Spouse/significant other;Children Available Help at Discharge: Family;Available 24 hours/day Type of Home: House Home Access: Level entry     Home Layout: Two level;Bed/bath upstairs Home Equipment: Grab bars - toilet;Walker - 2 wheels      Prior Function Level of Independence: Independent         Comments: reports dizzy spells, only used RW to walk to ambulance; normally does not use     Hand Dominance   Dominant Hand: Right    Extremity/Trunk Assessment   Upper Extremity Assessment Upper Extremity Assessment: Defer to OT evaluation    Lower Extremity Assessment Lower Extremity Assessment: Generalized weakness    Cervical / Trunk Assessment Cervical /  Trunk Assessment: Normal  Communication   Communication: HOH  Cognition Arousal/Alertness: Awake/alert Behavior During Therapy: WFL for tasks assessed/performed Overall Cognitive Status: Within Functional Limits for tasks assessed                                        General Comments General comments (skin integrity,  edema, etc.): RN called for chest pain    Exercises     Assessment/Plan    PT Assessment Patient needs continued PT services  PT Problem List Decreased strength;Decreased activity tolerance;Decreased balance;Decreased mobility;Decreased knowledge of use of DME;Cardiopulmonary status limiting activity       PT Treatment Interventions DME instruction;Gait training;Stair training;Functional mobility training;Therapeutic activities;Therapeutic exercise;Balance training;Patient/family education    PT Goals (Current goals can be found in the Care Plan section)  Acute Rehab PT Goals Patient Stated Goal: to get back home and fish PT Goal Formulation: Patient unable to participate in goal setting(undergoing EKG) Time For Goal Achievement: 04/29/19 Potential to Achieve Goals: Good    Frequency Min 3X/week   Barriers to discharge Decreased caregiver support all household members sick with COVID; 2 others hospitalized    Co-evaluation               AM-PAC PT "6 Clicks" Mobility  Outcome Measure Help needed turning from your back to your side while in a flat bed without using bedrails?: None Help needed moving from lying on your back to sitting on the side of a flat bed without using bedrails?: None Help needed moving to and from a bed to a chair (including a wheelchair)?: A Little Help needed standing up from a chair using your arms (e.g., wheelchair or bedside chair)?: A Little Help needed to walk in hospital room?: A Little Help needed climbing 3-5 steps with a railing? : Total 6 Click Score: 18    End of Session   Activity Tolerance: Treatment limited secondary to medical complications (Comment)(chest pain) Patient left: in bed;with nursing/sitter in room Nurse Communication: Mobility status;Other (comment)(chest pain; irregular HR; RUE IV infiltrated) PT Visit Diagnosis: Unsteadiness on feet (R26.81);Muscle weakness (generalized) (M62.81)    Time: 5035-4656 PT Time  Calculation (min) (ACUTE ONLY): 46 min   Charges:   PT Evaluation $PT Eval Moderate Complexity: 1 Mod PT Treatments $Gait Training: 8-22 mins $Self Care/Home Management: 8-22          Barry Brunner, PT      Rexanne Mano 04/15/2019, 10:19 AM

## 2019-04-15 NOTE — Progress Notes (Signed)
PROGRESS NOTE  Dylan Goodman VHQ:[id number] DOB: [birth year] DOA: 04/13/2019 PCP: Steele Sizer, MD   LOS: 2 days   Brief Narrative / Interim history: 68 yo M with history of CAD, chronic systolic CHF with an EF of 35%, insulin-dependent diabetes mellitus, chronic diarrhea, chronic A. fib on Eliquis, CKD stage IV with a baseline creatinine of about 2.6 who was admitted to the hospital on 04/13/2019 with diarrhea, cough, shortness of breath.  He was febrile in the ED, was also hypoxic placed on 2 L of oxygen.  Subjective: Denies any shortness of breath this morning, he is on room air and about to work with physical therapy.  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  He initially required 4 L nasal cannula to keep O2 sats above 92%, however on my evaluation he is about to work with PT and he is on room air. -He was started on Remdesivir, continue, last dose on 9/18, today day 3/5 -Chest x-ray from 04/12/2019 without infiltrates -Placed on Decadron, decrease dose to 6 mg today  Active Problems Acute kidney injury superimposed on stage IV  chronic kidney disease / metabolic acidosis / hyperkalemia -Likely prerenal in the setting of decreased oral intake, worsening diarrhea and losartan use.  Creatinine on admission was 6, he did receive fluids in the ED as well as a bicarbonate infusion.  He is now off of fluids and his creatinine seems to have stabilized around 2 close to his baseline  Coronary artery disease/chest pain when working with PT on 9/16 -Patient reported chest pain with ambulating with PT.  EKG pending.  Will check high-sensitivity troponin q. 2-hour x 2.  Add back Imdur from home regimen -Continue statin, Brilinta, cilostazol, Coreg  Chronic A. fib -Continue rate control with carvedilol, apixaban for anticoagulation  Diabetes mellitus with hyperglycemia -Persistently elevated CBGs today, increase mealtime hopefully they will continue to improve as steroids dose has been decreased  Chronic systolic CHF -Appears to be compensated, last EF was 35% in August 2020 -Hold ARB given acute kidney injury but continue Imdur and Coreg  Chronic diarrhea -Likely worsened by COVID-19 viral infection   Scheduled Meds: . apixaban  5 mg Oral BID  . calcium carbonate  1 tablet Oral TID WC  . carvedilol  6.25 mg Oral BID  . cilostazol  100 mg Oral BID  . dexamethasone  6 mg Oral QHS  . insulin aspart  0-5 Units Subcutaneous QHS  . insulin aspart  0-9 Units Subcutaneous TID WC  . insulin detemir  10 Units Subcutaneous BID  . [START ON 04/16/2019] isosorbide mononitrate  120 mg Oral Daily  . mouth rinse  15 mL Mouth Rinse BID  .  pantoprazole  40 mg Oral Daily  . pravastatin  10 mg Oral q1800  . sodium chloride flush  3 mL Intravenous Q12H  . ticagrelor  60 mg Oral BID   Continuous Infusions: . remdesivir 100 mg in NS 250 mL 100 mg (04/15/19 0910)   PRN Meds:.acetaminophen, diazepam, HYDROcodone-acetaminophen, meclizine, ondansetron **OR** ondansetron (ZOFRAN) IV  DVT prophylaxis: Eliquis  Code Status: Full code  Family Communication: d/w patient  Disposition Plan: home when ready   Consultants:   None   Procedures:   None   Antimicrobials:  None    Objective: Vitals:   04/15/19 1000 04/15/19 1015 04/15/19 1017 04/15/19 1100  BP: (!) 148/93 (!) 149/73 (!) 143/70   Pulse:  (!) 103 (!) 103   Resp: 20 20 16  (!) 22  Temp: 98.2 F (36.8 C) 98.2 F (36.8 C) 98.2 F (36.8 C)   TempSrc: Oral Oral Oral   SpO2:  93% 95%   Weight:      Height:        Intake/Output Summary (Last 24 hours) at 04/15/2019 1117 Last data filed at 04/15/2019 0521 Gross per 24 hour  Intake 918.54 ml  Output 1700 ml  Net -781.46 ml   Filed Weights   04/13/19 0208  Weight: 97.5 kg    Examination:  Constitutional: NAD Eyes: PERRL, lids and conjunctivae normal ENMT: Mucous membranes are moist. No oropharyngeal exudates Neck: normal, supple, no masses appreciated Respiratory: clear to auscultation bilaterally, no wheezing, no crackles. Normal respiratory effort. No accessory muscle use.  Cardiovascular: irregular, no murmurs / rubs / gallops. No LE edema. Good peripheral pulses Abdomen: no tenderness. Bowel sounds positive.  Musculoskeletal: no clubbing / cyanosis Skin: no rashes Neurologic: CN 2-12 grossly intact. Strength 5/5 in all 4.  Psychiatric: Normal judgment and insight. Alert and oriented x 3. Normal mood.    Data Reviewed: I have independently reviewed following labs and imaging studies   CBC: Recent Labs  Lab 04/12/19 1658 04/14/19 0215  WBC 5.3 3.1*  NEUTROABS  --  2.6  HGB 12.4* 11.9*  HCT 38.0* 36.2*  MCV 88.4 89.4  PLT 184 885   Basic Metabolic Panel: Recent Labs  Lab 04/12/19 1658 04/13/19 0230 04/14/19 0215 04/14/19 1304 04/15/19 0158  NA 131* 138 139  --  136  K 5.2* 3.7 3.3*  --  3.5  CL 103 108 105  --  105  CO2 12* 14* 20*  --  19*  GLUCOSE 93 78 194*  --  340*  BUN 94* 85* 73*  --  70*  CREATININE 5.93* 5.03* 3.52*  --  2.86*  CALCIUM 5.0* 5.3* 5.6*  --   6.2*  MG 0.6* 0.8* 0.6* 1.9 2.0  PHOS  --   --   --   --  2.4*   GFR: Estimated Creatinine Clearance: 31.2 mL/min (A) (by C-G formula based on SCr of 2.86 mg/dL (H)). Liver Function Tests: Recent Labs  Lab 04/12/19 1658 04/13/19 0230 04/14/19 0215 04/15/19 0158  AST 50* 37 37 37  ALT 26 24 24 24   ALKPHOS 56 55 57 55  BILITOT 1.2 0.4 0.3 0.5  PROT 6.4* 5.8* 5.9* 5.5*  ALBUMIN 3.0* 2.9* 2.8* 2.7*   No results for input(s): LIPASE, AMYLASE in the last 168 hours. No results for input(s): AMMONIA in the last 168 hours. Coagulation Profile: No results for input(s): INR, PROTIME in the last 168 hours. Cardiac Enzymes: No results for input(s): CKTOTAL, CKMB, CKMBINDEX, TROPONINI in the last  168 hours. BNP (last 3 results) No results for input(s): PROBNP in the last 8760 hours. HbA1C: No results for input(s): HGBA1C in the last 72 hours. CBG: Recent Labs  Lab 04/13/19 2026 04/14/19 0811 04/14/19 1310 04/14/19 2132 04/15/19 0717  GLUCAP 262* 285* 267* 301* 343*   Lipid Profile: No results for input(s): CHOL, HDL, LDLCALC, TRIG, CHOLHDL, LDLDIRECT in the last 72 hours. Thyroid Function Tests: No results for input(s): TSH, T4TOTAL, FREET4, T3FREE, THYROIDAB in the last 72 hours. Anemia Panel: Recent Labs    04/14/19 0215 04/15/19 0158  FERRITIN 809* 906*   Urine analysis:    Component Value Date/Time   COLORURINE YELLOW (A) 04/12/2019 2214   APPEARANCEUR CLEAR (A) 04/12/2019 2214   LABSPEC 1.006 04/12/2019 2214   PHURINE 5.0 04/12/2019 2214   GLUCOSEU NEGATIVE 04/12/2019 2214   HGBUR SMALL (A) 04/12/2019 2214   BILIRUBINUR NEGATIVE 04/12/2019 2214   [place] 04/12/2019 2214   PROTEINUR NEGATIVE 04/12/2019 2214   NITRITE NEGATIVE 04/12/2019 2214   LEUKOCYTESUR NEGATIVE 04/12/2019 2214   Sepsis Labs: Invalid input(s): PROCALCITONIN, LACTICIDVEN  Recent Results (from the past 240 hour(s))  SARS Coronavirus 2 [facility] order, Performed in [facility]  hospital lab) Nasopharyngeal Nasopharyngeal Swab     Status: Abnormal   Collection Time: 04/12/19  6:04 PM   Specimen: Nasopharyngeal Swab  Result Value Ref Range Status   SARS Coronavirus 2 POSITIVE (A) NEGATIVE Final    Comment: RESULT CALLED TO, READ BACK BY AND VERIFIED WITH: TOM NAGY AT 1950 04/12/2019 [facility] (NOTE) If result is NEGATIVE SARS-CoV-2 target nucleic acids are NOT DETECTED. The SARS-CoV-2 RNA is generally detectable in upper and lower  respiratory specimens during the acute phase of infection. The lowest  concentration of SARS-CoV-2 viral copies this assay can detect is 250  copies / mL. A negative result does not preclude SARS-CoV-2 infection  and should not be used as the sole basis for treatment or other  patient management decisions.  A negative result may occur with  improper specimen collection / handling, submission of specimen other  than nasopharyngeal swab, presence of viral mutation(s) within the  areas targeted by this assay, and inadequate number of viral copies  (<250 copies / mL). A negative result must be combined with clinical  observations, patient history, and epidemiological information. If result is POSITIVE SARS-CoV-2 target nucleic acids are DETECTED. The S ARS-CoV-2 RNA is generally detectable in upper and lower  respiratory specimens during the acute phase of infection.  Positive  results are indicative of active infection with SARS-CoV-2.  Clinical  correlation with patient history and other diagnostic information is  necessary to determine patient infection status.  Positive results do  not rule out bacterial infection or co-infection with other viruses. If result is PRESUMPTIVE POSTIVE SARS-CoV-2 nucleic acids MAY BE PRESENT.   A presumptive positive result was obtained on the submitted specimen  and confirmed on repeat testing.  While 2019 novel coronavirus  (SARS-CoV-2) nucleic acids may be present in the submitted sample  additional  confirmatory testing may be necessary for epidemiological  and / or clinical management purposes  to differentiate between  SARS-CoV-2 and other Sarbecovirus currently known to infect humans.  If clinically indicated additional testing with an alternate test  methodology [phone]) is adv ised. The SARS-CoV-2 RNA is generally  detectable in upper and lower respiratory specimens during the acute  phase of infection. The expected result is Negative. Fact Sheet for Patients:  StrictlyIdeas.no Fact Sheet for Healthcare  Providers: BankingDealers.co.za This test is not yet approved or cleared by the [place] and has been authorized for detection and/or diagnosis of SARS-CoV-2 by FDA under an Emergency Use Authorization (EUA).  This EUA will remain in effect (meaning this test can be used) for the duration of the COVID-19 declaration under Section 564(b)(1) of the Act, 21 U.S.C. section 360bbb-3(b)(1), unless the authorization is terminated or revoked sooner. Performed at [facility], [address] [place], [place] [address]   Blood culture (routine x 2)     Status: None (Preliminary result)   Collection Time: 04/12/19  7:01 PM   Specimen: BLOOD  Result Value Ref Range Status   Specimen Description BLOOD LEFT HAND  Final   Special Requests   Final    BOTTLES DRAWN AEROBIC AND ANAEROBIC Blood Culture results may not be optimal due to an inadequate volume of blood received in culture bottles   Culture   Final    NO GROWTH 3 DAYS Performed at [facility], [address] [place], [place] [address]    Report Status PENDING  Incomplete  Blood culture (routine x 2)     Status: None (Preliminary result)   Collection Time: 04/12/19  7:01 PM   Specimen: BLOOD  Result Value Ref Range Status   Specimen Description BLOOD LEFT ASSIST CONTROL  Final   Special Requests   Final    BOTTLES DRAWN AEROBIC AND ANAEROBIC Blood  Culture results may not be optimal due to an excessive volume of blood received in culture bottles   Culture   Final    NO GROWTH 3 DAYS Performed at [facility], [address] [place], [place] [address]    Report Status PENDING  Incomplete      Radiology Studies: US Renal  Result Date: 04/13/2019 CLINICAL DATA:  Acute on chronic renal failure. EXAM: RENAL / URINARY TRACT ULTRASOUND COMPLETE COMPARISON:  None. FINDINGS: Right Kidney: Renal measurements: 10.3 x 6.4 x 5.4 cm = volume: 188 mL. Slightly increased echogenicity of renal parenchyma is noted. No mass or hydronephrosis visualized. Left Kidney: Renal measurements: 12.5 x 6.2 x 5.6 cm = volume: 227 mL. Slightly increased echogenicity of renal parenchyma is noted. No mass or hydronephrosis visualized. Bladder: Appears normal for degree of bladder distention. Bilateral ureteral jets are noted. IMPRESSION: Slightly increased echogenicity of renal parenchyma is noted bilaterally suggesting medical renal disease. No hydronephrosis or renal obstruction is noted. Electronically Signed   By: Marijo Conception M.D.   On: 04/13/2019 13:36    Marzetta Board, MD, PhD [facility]  Contact via  [email or web address]  [place] P: [phone] F: [phone]

## 2019-04-15 NOTE — Progress Notes (Signed)
Pt has triggered a 3 on his MEWS score R/T his HR. He is AFIB with HR 90-120 and Physician is aware. Carvedilol given and HR is decreasing slowly

## 2019-04-15 NOTE — Progress Notes (Signed)
Night shift telemetry coverage note.  The patient's calcium result from this morning came at 6.2 mg/dL.  Corrected to an albumin of 2.7 g/dL, calcium is 7.2 mg/dL.  This is trending up from previous.  I will add the the following to morning labs PTH, phosphorus and vitamin D level.  1 g of calcium gluconate IVPB was ordered.  Tennis Must, MD.

## 2019-04-15 NOTE — Progress Notes (Signed)
Called patient spouse Patrica and left status update on answering machine with call back number.

## 2019-04-15 NOTE — Progress Notes (Signed)
CRITICAL VALUE ALERT  Critical Value:  Calcium 6.2  Date & Time Notied:  04/15/2019 at 0454  Provider Notified: Primary nurse [place] notified of critical value and text message sent to Dr. Olevia Bowens through [facility]  Orders Received/Actions taken: awaiting any new orders

## 2019-04-15 NOTE — Progress Notes (Signed)
Report diven to me by dayshift RN during BSR. Pt is sitting at edge of bed and talking with family. Family updated. Pt's HS meds given early d/t his HR increasing so carvidilol has been given. Pt denies pain and SOB. VSS

## 2019-04-16 LAB — COMPREHENSIVE METABOLIC PANEL
ALT: 23 U/L (ref 0–44)
AST: 28 U/L (ref 15–41)
Albumin: 2.5 g/dL — ABNORMAL LOW (ref 3.5–5.0)
Alkaline Phosphatase: 46 U/L (ref 38–126)
Anion gap: 10 (ref 5–15)
BUN: 67 mg/dL — ABNORMAL HIGH (ref 8–23)
CO2: 20 mmol/L — ABNORMAL LOW (ref 22–32)
Calcium: 6.9 mg/dL — ABNORMAL LOW (ref 8.9–10.3)
Chloride: 107 mmol/L (ref 98–111)
Creatinine, Ser: 2.52 mg/dL — ABNORMAL HIGH (ref 0.61–1.24)
GFR calc Af Amer: 30 mL/min — ABNORMAL LOW (ref >60)
GFR calc non Af Amer: 26 mL/min — ABNORMAL LOW (ref >60)
Glucose, Bld: 231 mg/dL — ABNORMAL HIGH (ref 70–99)
Potassium: 3.6 mmol/L (ref 3.5–5.1)
Sodium: 137 mmol/L (ref 135–145)
Total Bilirubin: 0.4 mg/dL (ref 0.3–1.2)
Total Protein: 5.3 g/dL — ABNORMAL LOW (ref 6.5–8.1)

## 2019-04-16 LAB — MAGNESIUM: Magnesium: 1.2 mg/dL — ABNORMAL LOW (ref 1.7–2.4)

## 2019-04-16 LAB — GLUCOSE, CAPILLARY
Glucose-Capillary: 191 mg/dL — ABNORMAL HIGH (ref 70–99)
Glucose-Capillary: 207 mg/dL — ABNORMAL HIGH (ref 70–99)
Glucose-Capillary: 265 mg/dL — ABNORMAL HIGH (ref 70–99)
Glucose-Capillary: 365 mg/dL — ABNORMAL HIGH (ref 70–99)

## 2019-04-16 LAB — VITAMIN D 25 HYDROXY (VIT D DEFICIENCY, FRACTURES): Vit D, 25-Hydroxy: 22.9 ng/mL — ABNORMAL LOW (ref 30.0–100.0)

## 2019-04-16 LAB — PARATHYROID HORMONE, INTACT (NO CA): PTH: 176 pg/mL — ABNORMAL HIGH (ref 15–65)

## 2019-04-16 LAB — CBC
HCT: 33.4 % — ABNORMAL LOW (ref 39.0–52.0)
Hemoglobin: 10.9 g/dL — ABNORMAL LOW (ref 13.0–17.0)
MCH: 29.5 pg (ref 26.0–34.0)
MCHC: 32.6 g/dL (ref 30.0–36.0)
MCV: 90.5 fL (ref 80.0–100.0)
Platelets: 201 10*3/uL (ref 150–400)
RBC: 3.69 MIL/uL — ABNORMAL LOW (ref 4.22–5.81)
RDW: 14.2 % (ref 11.5–15.5)
WBC: 9 10*3/uL (ref 4.0–10.5)
nRBC: 0 % (ref 0.0–0.2)

## 2019-04-16 LAB — FERRITIN: Ferritin: 747 ng/mL — ABNORMAL HIGH (ref 24–336)

## 2019-04-16 LAB — C-REACTIVE PROTEIN: CRP: 3.4 mg/dL — ABNORMAL HIGH (ref <1.0)

## 2019-04-16 LAB — D-DIMER, QUANTITATIVE: D-Dimer, Quant: 6.1 ug/mL-FEU — ABNORMAL HIGH (ref 0.00–0.50)

## 2019-04-16 MED ORDER — SODIUM BICARBONATE 650 MG PO TABS
650.0000 mg | ORAL_TABLET | Freq: Every day | ORAL | Status: DC
  Administered 2019-04-16 – 2019-04-18 (×3): 650 mg via ORAL
  Filled 2019-04-16 (×4): qty 1

## 2019-04-16 MED ORDER — MAGNESIUM SULFATE 4 GM/100ML IV SOLN
4.0000 g | Freq: Two times a day (BID) | INTRAVENOUS | Status: AC
  Administered 2019-04-16 (×2): 4 g via INTRAVENOUS
  Filled 2019-04-16 (×2): qty 100

## 2019-04-16 MED ORDER — POTASSIUM CHLORIDE CRYS ER 20 MEQ PO TBCR
20.0000 meq | EXTENDED_RELEASE_TABLET | Freq: Once | ORAL | Status: AC
  Administered 2019-04-16: 20 meq via ORAL
  Filled 2019-04-16: qty 1

## 2019-04-16 MED ORDER — NITROGLYCERIN 0.4 MG SL SUBL: 0.4000 mg | SUBLINGUAL_TABLET | SUBLINGUAL | Status: DC | PRN

## 2019-04-16 MED ORDER — CARVEDILOL 12.5 MG PO TABS
12.5000 mg | ORAL_TABLET | Freq: Two times a day (BID) | ORAL | Status: DC
  Administered 2019-04-16 (×2): 12.5 mg via ORAL
  Filled 2019-04-16 (×2): qty 1

## 2019-04-16 MED ORDER — CALCIUM CARBONATE 1250 (500 CA) MG PO TABS
1.0000 | ORAL_TABLET | Freq: Three times a day (TID) | ORAL | Status: AC
  Administered 2019-04-16 – 2019-04-18 (×6): 500 mg via ORAL
  Filled 2019-04-16 (×6): qty 1

## 2019-04-16 NOTE — Progress Notes (Signed)
PROGRESS NOTE  Dylan Goodman  YIR:[id number] DOB: 06-06-1951 DOA: 04/13/2019 PCP: Steele Sizer, MD  Outpatient Specialists: [facility] multiple providers Brief Narrative: Dylan Goodman is a 68 y.o. 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 CRP elevated at 11.9, d-dimer 10.01. Creatinine also elevated at 5.93 in the setting of multiple metabolic derangements including Na 131, K 5.2, bicarbonate 12, calcium 5, anion gap 16, magnesium 0.6, AST 50. Steroids and remdesivir were started, 4LPM O2 given, and the patient was admitted to [facility] for management of covid-19 pneumonia and AKI.   Assessment & Plan: Principal Problem:   Acute renal failure superimposed on stage 4 chronic kidney disease (HCC) Active Problems:   Chronic diarrhea of unknown origin   Chronic systolic CHF (congestive heart failure), NYHA class 2 (HCC)   Coronary artery disease involving coronary bypass graft of native heart   Hypomagnesemia   Menetrier's disease (hyperplastic hypersecretory gastropathy)   Type 2 diabetes mellitus with stage 4 chronic kidney disease, with long-term current use of insulin (HCC)   Hypocalcemia   Hyponatremia   Metabolic acidosis   Unspecified atrial fibrillation ([place])   COVID-19 virus infection   Acute respiratory failure with hypoxia ([place])  Acute hypoxic respiratory failure due to covid-19 pneumonitis:  - Hypoxia improving, can supplement to maintain SpO2 >90%, though currently weaned to room air. - Continue remdesivir 9/14 - 9/18 - Continue steroids, dose decreased 9/16 due to hyperglycemia and improved symptoms - Continue airborne, contact precautions. PPE including surgical gown, gloves, cap, shoe covers, and CAPR used during this  encounter in a negative pressure room.  - Check daily labs: CBC w/diff, CMP, d-dimer, ferritin, CRP. CRP declining 11.9 > 3.4. D-dimer elevated but trending downward. Pt is on therapeutic anticoagulation and has no symptoms of LE DVT and hypoxia improving  - Avoid NSAIDs - Recommend proning and aggressive use of incentive spirometry. - Goals of care were discussed. Prognosis is guarded.   AKI on stage IV CKD: Improved back to baseline as of 9/17. Renal U/S with echogenic kidneys bilaterally without hydronephrosis. No proteinuria, +hyaline casts argues for prerenal injury. - Continue holding losartan  CAD s/p CABG with angina, PAD: Troponin minimally elevated and downtrending with no ECG changes not consistent with ACS.  - Continue beta blocker, and antianginals including imdur, prn NTG. - On pletal, brilinta, eliquis, and statin as well.   Chronic AFib, now with RVR:  - Increase coreg to 12.5mg  BID (MAP >80) and continue eliquis.  - Continue telemetry.   Chronic HFrEF: EF 35%. Appears euvolemic.  - Continue home beta blocker, antiplatelet, imdur. Holding ARB w/AKI.   IDT2DM:  - Continue long acting and bolus insulin, anticipate improved control with tapering steroids. Pt has voracious appetite.  Metabolic acidosis:  - Restart home bicarbonate  Hypomagnesemia:  - Supplement, monitor QTc on telemetry  Hypokalemia:  - Supplement cautiously in setting of chronic diarrhea.   Hypocalcemia, even when corrected for hypoalbuminemia: Improving, remains low with vitamin D deficiency.  - Supplement again today and continue monitoring.   Hypoalbuminemia likely due to Menetrier's disease:  - Supplement as able, pt has good appetite.  - [facility] up with [facility] for further evaluation.  Hyperlipidemia:  - Continue statin, fenofibrate, ezetimibe.   Chronic diarrhea:  -  Supportive care  DVT prophylaxis: Eliquis Code Status: Full Family Communication: None at bedside Disposition Plan:  Uncertain. If no longer hypoxic, and functionally able to thrive at home, can discharge 9/18 after final dose of remdesivir.  Consultants:   None  Procedures:   None  Antimicrobials:  Vancomycin, cefepime, flagyl x1 9/13  Remdesivir 9/14 - 9/18  Subjective: Feels much better. No shortness of breath at rest, some with activity but off of oxygen, ready to work with PT. No chest pain currently or palpitations. No leg swelling. Eating nearly double usual meals.   Objective: Vitals:   04/15/19 1610 04/15/19 2020 04/16/19 0700 04/16/19 0808  BP: 117/71 136/85  105/70  Pulse: 100  (!) 112 (!) 113  Resp: 20   19  Temp: (!) 97.5 F (36.4 C) 98.1 F (36.7 C)  97.7 F (36.5 C)  TempSrc: Oral Oral  Oral  SpO2: 93%   91%  Weight:      Height:        Intake/Output Summary (Last 24 hours) at 04/16/2019 0837 Last data filed at 04/16/2019 0831 Gross per 24 hour  Intake 1700 ml  Output 4550 ml  Net -2850 ml   Filed Weights   04/13/19 0208  Weight: 97.5 kg    Gen: 68 y.o. male in no distress  Pulm: Non-labored breathing room air. Clear to auscultation bilaterally.  CV: Irreg irreg. No murmur, rub, or gallop. No JVD, no pedal edema. GI: Abdomen soft, non-tender, non-distended, with normoactive bowel sounds. No organomegaly or masses felt. Ext: Warm, no deformities Skin: Sternotomy scar, otherwise no rashes, lesions or ulcers on visualized skin Neuro: Alert and oriented. No focal neurological deficits. Psych: Judgement and insight appear normal. Mood & affect appropriate.   Data Reviewed: I have personally reviewed following labs and imaging studies  CBC: Recent Labs  Lab 04/12/19 1658 04/14/19 0215 04/16/19 0213  WBC 5.3 3.1* 9.0  NEUTROABS  --  2.6  --   HGB 12.4* 11.9* 10.9*  HCT 38.0* 36.2* 33.4*  MCV 88.4 89.4 90.5  PLT 184 167 350   Basic Metabolic Panel: Recent Labs  Lab 04/12/19 1658 04/13/19 0230 04/14/19 0215 04/14/19 1304 04/15/19 0158 04/16/19 0213   NA 131* 138 139  --  136 137  K 5.2* 3.7 3.3*  --  3.5 3.6  CL 103 108 105  --  105 107  CO2 12* 14* 20*  --  19* 20*  GLUCOSE 93 78 194*  --  340* 231*  BUN 94* 85* 73*  --  70* 67*  CREATININE 5.93* 5.03* 3.52*  --  2.86* 2.52*  CALCIUM 5.0* 5.3* 5.6*  --  6.2* 6.9*  MG 0.6* 0.8* 0.6* 1.9 2.0 1.2*  PHOS  --   --   --   --  2.4*  --    GFR: Estimated Creatinine Clearance: 35.4 mL/min (A) (by C-G formula based on SCr of 2.52 mg/dL (H)). Liver Function Tests: Recent Labs  Lab 04/12/19 1658 04/13/19 0230 04/14/19 0215 04/15/19 0158 04/16/19 0213  AST 50* 37 37 37 28  ALT 26 24 24 24 23   ALKPHOS 56 55 57 55 46  BILITOT 1.2 0.4 0.3 0.5 0.4  PROT 6.4* 5.8* 5.9* 5.5* 5.3*  ALBUMIN 3.0* 2.9* 2.8* 2.7* 2.5*   No results for input(s): LIPASE, AMYLASE in the last 168 hours. No results for input(s): AMMONIA in the last 168 hours. Coagulation Profile: No results for input(s): INR, PROTIME in the last 168 hours. Cardiac  Enzymes: No results for input(s): CKTOTAL, CKMB, CKMBINDEX, TROPONINI in the last 168 hours. BNP (last 3 results) No results for input(s): PROBNP in the last 8760 hours. HbA1C: No results for input(s): HGBA1C in the last 72 hours. CBG: Recent Labs  Lab 04/15/19 0717 04/15/19 1208 04/15/19 1607 04/15/19 2036 04/16/19 0806  GLUCAP 343* 284* 194* 224* 191*   Lipid Profile: No results for input(s): CHOL, HDL, LDLCALC, TRIG, CHOLHDL, LDLDIRECT in the last 72 hours. Thyroid Function Tests: No results for input(s): TSH, T4TOTAL, FREET4, T3FREE, THYROIDAB in the last 72 hours. Anemia Panel: Recent Labs    04/15/19 0158 04/16/19 0213  FERRITIN 906* 747*   Urine analysis:    Component Value Date/Time   COLORURINE YELLOW (A) 04/12/2019 2214   APPEARANCEUR CLEAR (A) 04/12/2019 2214   LABSPEC 1.006 04/12/2019 2214   PHURINE 5.0 04/12/2019 2214   GLUCOSEU NEGATIVE 04/12/2019 2214   HGBUR SMALL (A) 04/12/2019 2214   BILIRUBINUR NEGATIVE 04/12/2019 2214    [place] 04/12/2019 2214   PROTEINUR NEGATIVE 04/12/2019 2214   NITRITE NEGATIVE 04/12/2019 2214   LEUKOCYTESUR NEGATIVE 04/12/2019 2214   Recent Results (from the past 240 hour(s))  SARS Coronavirus 2 [facility] order, Performed in [facility] hospital lab) Nasopharyngeal Nasopharyngeal Swab     Status: Abnormal   Collection Time: 04/12/19  6:04 PM   Specimen: Nasopharyngeal Swab  Result Value Ref Range Status   SARS Coronavirus 2 POSITIVE (A) NEGATIVE Final    Comment: RESULT CALLED TO, READ BACK BY AND VERIFIED WITH: TOM NAGY AT 1950 04/12/2019 [facility] (NOTE) If result is NEGATIVE SARS-CoV-2 target nucleic acids are NOT DETECTED. The SARS-CoV-2 RNA is generally detectable in upper and lower  respiratory specimens during the acute phase of infection. The lowest  concentration of SARS-CoV-2 viral copies this assay can detect is 250  copies / mL. A negative result does not preclude SARS-CoV-2 infection  and should not be used as the sole basis for treatment or other  patient management decisions.  A negative result may occur with  improper specimen collection / handling, submission of specimen other  than nasopharyngeal swab, presence of viral mutation(s) within the  areas targeted by this assay, and inadequate number of viral copies  (<250 copies / mL). A negative result must be combined with clinical  observations, patient history, and epidemiological information. If result is POSITIVE SARS-CoV-2 target nucleic acids are DETECTED. The S ARS-CoV-2 RNA is generally detectable in upper and lower  respiratory specimens during the acute phase of infection.  Positive  results are indicative of active infection with SARS-CoV-2.  Clinical  correlation with patient history and other diagnostic information is  necessary to determine patient infection status.  Positive results do  not rule out bacterial infection or co-infection with other viruses. If result is PRESUMPTIVE POSTIVE  SARS-CoV-2 nucleic acids MAY BE PRESENT.   A presumptive positive result was obtained on the submitted specimen  and confirmed on repeat testing.  While 2019 novel coronavirus  (SARS-CoV-2) nucleic acids may be present in the submitted sample  additional confirmatory testing may be necessary for epidemiological  and / or clinical management purposes  to differentiate between  SARS-CoV-2 and other Sarbecovirus currently known to infect humans.  If clinically indicated additional testing with an alternate test  methodology [id number]) is adv ised. The SARS-CoV-2 RNA is generally  detectable in upper and lower respiratory specimens during the acute  phase of infection. The expected result is Negative. Fact Sheet for Patients:  StrictlyIdeas.no Fact Sheet for Healthcare Providers: BankingDealers.co.za This test is not yet approved or cleared by the [place] FDA and has been authorized for detection and/or diagnosis of SARS-CoV-2 by FDA under an Emergency Use Authorization (EUA).  This EUA will remain in effect (meaning this test can be used) for the duration of the COVID-19 declaration under Section 564(b)(1) of the Act, 21 U.S.C. section 360bbb-3(b)(1), unless the authorization is terminated or revoked sooner. Performed at [facility], [address] [place], [place] [address]   Blood culture (routine x 2)     Status: None (Preliminary result)   Collection Time: 04/12/19  7:01 PM   Specimen: BLOOD  Result Value Ref Range Status   Specimen Description BLOOD LEFT HAND  Final   Special Requests   Final    BOTTLES DRAWN AEROBIC AND ANAEROBIC Blood Culture results may not be optimal due to an inadequate volume of blood received in culture bottles   Culture   Final    NO GROWTH 4 DAYS Performed at [facility], [address], [place], [place] [address]    Report Status PENDING  Incomplete  Blood culture (routine x  2)     Status: None (Preliminary result)   Collection Time: 04/12/19  7:01 PM   Specimen: BLOOD  Result Value Ref Range Status   Specimen Description BLOOD LEFT ASSIST CONTROL  Final   Special Requests   Final    BOTTLES DRAWN AEROBIC AND ANAEROBIC Blood Culture results may not be optimal due to an excessive volume of blood received in culture bottles   Culture   Final    NO GROWTH 4 DAYS Performed at [facility], [address] [place], [place] [address]    Report Status PENDING  Incomplete      Radiology Studies: No results found.  Scheduled Meds: . apixaban  5 mg Oral BID  . calcium carbonate  1 tablet Oral TID WC  . carvedilol  12.5 mg Oral BID  . cilostazol  100 mg Oral BID  . dexamethasone  6 mg Oral QHS  . insulin aspart  0-5 Units Subcutaneous QHS  . insulin aspart  0-9 Units Subcutaneous TID WC  . insulin aspart  4 Units Subcutaneous TID WC  . insulin detemir  10 Units Subcutaneous BID  . isosorbide dinitrate  40 mg Oral TID PC  . mouth rinse  15 mL Mouth Rinse BID  . pantoprazole  40 mg Oral Daily  . potassium chloride  20 mEq Oral Once  . pravastatin  10 mg Oral q1800  . sodium bicarbonate  650 mg Oral Daily  . sodium chloride flush  3 mL Intravenous Q12H  . ticagrelor  60 mg Oral BID   Continuous Infusions: . magnesium sulfate bolus IVPB    . remdesivir 100 mg in NS 250 mL Stopped (04/15/19 2014)     LOS: 3 days   Time spent: 35 minutes.  Patrecia Pour, MD [facility] [email or web address] Password [facility] 04/16/2019, 8:37 AM

## 2019-04-16 NOTE — Progress Notes (Signed)
Physical Therapy Treatment Patient Details Name: Dylan Goodman MRN: [id number] DOB: 1951/05/09 Today's Date: 04/16/2019    History of Present Illness 68 y.o. male with medical history significant for coronary artery disease s/p CABG, PVD with LE stents and BPG; chronic systolic CHF EF 28-41%, insulin-dependent diabetes mellitus, vertigo, chronic diarrhea attributed to Menetrier disease, atrial fibrillation on Eliquis, and chronic kidney disease stage IV, now presenting to the emergency department for evaluation of lightheadedness, chills, worsened diarrhea, cough, and shortness of breath. He reports to household contacts that have had COVID 19. + for COVID-19    PT Comments    Patient able to tolerate ambulation x 400 ft with RW for stability with HR 110s to max of 138 (briefly) and sats >92% on room air. He remains slightly weak and unsteady and agrees to use RW at home if discharged tomorrow. (he already owns RW).     Follow Up Recommendations  Home health PT;Supervision - Intermittent     Equipment Recommendations  None recommended by PT    Recommendations for Other Services       Precautions / Restrictions Precautions Precautions: Fall    Mobility  Bed Mobility Overal bed mobility: Modified Independent             General bed mobility comments: sitting EOB on arrival  Transfers Overall transfer level: Needs assistance Equipment used: Rolling walker (2 wheeled) Transfers: Sit to/from [facility] Sit to Stand: Min guard         General transfer comment: vc for safe use of RW; pt with improper hand placement and walker tipping  Ambulation/Gait Ambulation/Gait assistance: Min guard Gait Distance (Feet): 400 Feet Assistive device: Rolling walker (2 wheeled) Gait Pattern/deviations: Step-through pattern;Decreased stride length;Wide base of support Gait velocity: slow   General Gait Details: vc for proper use of RW (tries to lift to advance;  proximity); walked final 10 ft with no device with wide BOS and slight unsteadiness   Stairs             Wheelchair Mobility    Modified Rankin (Stroke Patients Only)       Balance Overall balance assessment: Needs assistance   Sitting balance-Leahy Scale: Good     Standing balance support: No upper extremity supported Standing balance-Leahy Scale: Fair                              Cognition Arousal/Alertness: Awake/alert Behavior During Therapy: WFL for tasks assessed/performed Overall Cognitive Status: Within Functional Limits for tasks assessed                                        Exercises      General Comments        Pertinent Vitals/Pain Pain Assessment: No/denies pain    Home Living                      Prior Function            PT Goals (current goals can now be found in the care plan section) Acute Rehab PT Goals Patient Stated Goal: to get back home and fish PT Goal Formulation: (undergoing EKG) Time For Goal Achievement: 04/29/19 Potential to Achieve Goals: Good Progress towards PT goals: Progressing toward goals    Frequency    Min 3X/week  PT Plan Current plan remains appropriate    Co-evaluation              AM-PAC PT "6 Clicks" Mobility   Outcome Measure  Help needed turning from your back to your side while in a flat bed without using bedrails?: None Help needed moving from lying on your back to sitting on the side of a flat bed without using bedrails?: None Help needed moving to and from a bed to a chair (including a wheelchair)?: A Little Help needed standing up from a chair using your arms (e.g., wheelchair or bedside chair)?: A Little Help needed to walk in hospital room?: A Little Help needed climbing 3-5 steps with a railing? : A Little 6 Click Score: 20    End of Session   Activity Tolerance: Patient tolerated treatment well Patient left: in chair;with call  bell/phone within reach   PT Visit Diagnosis: Unsteadiness on feet (R26.81);Muscle weakness (generalized) (M62.81)     Time: 8184-0375 PT Time Calculation (min) (ACUTE ONLY): 33 min  Charges:  $Gait Training: 23-37 mins                       Barry Brunner, PT       Dylan Goodman 04/16/2019, 4:48 PM

## 2019-04-16 NOTE — Plan of Care (Signed)
POC reviewed with patient and family

## 2019-04-17 LAB — COMPREHENSIVE METABOLIC PANEL
ALT: 26 U/L (ref 0–44)
AST: 31 U/L (ref 15–41)
Albumin: 2.6 g/dL — ABNORMAL LOW (ref 3.5–5.0)
Alkaline Phosphatase: 50 U/L (ref 38–126)
Anion gap: 9 (ref 5–15)
BUN: 67 mg/dL — ABNORMAL HIGH (ref 8–23)
CO2: 20 mmol/L — ABNORMAL LOW (ref 22–32)
Calcium: 7.5 mg/dL — ABNORMAL LOW (ref 8.9–10.3)
Chloride: 107 mmol/L (ref 98–111)
Creatinine, Ser: 2.28 mg/dL — ABNORMAL HIGH (ref 0.61–1.24)
GFR calc Af Amer: 34 mL/min — ABNORMAL LOW (ref >60)
GFR calc non Af Amer: 29 mL/min — ABNORMAL LOW (ref >60)
Glucose, Bld: 251 mg/dL — ABNORMAL HIGH (ref 70–99)
Potassium: 3.4 mmol/L — ABNORMAL LOW (ref 3.5–5.1)
Sodium: 136 mmol/L (ref 135–145)
Total Bilirubin: 0.4 mg/dL (ref 0.3–1.2)
Total Protein: 5.3 g/dL — ABNORMAL LOW (ref 6.5–8.1)

## 2019-04-17 LAB — GLUCOSE, CAPILLARY
Glucose-Capillary: 215 mg/dL — ABNORMAL HIGH (ref 70–99)
Glucose-Capillary: 231 mg/dL — ABNORMAL HIGH (ref 70–99)
Glucose-Capillary: 194 mg/dL — ABNORMAL HIGH (ref 70–99)

## 2019-04-17 LAB — CULTURE, BLOOD (ROUTINE X 2)
Culture: NO GROWTH
Culture: NO GROWTH

## 2019-04-17 LAB — D-DIMER, QUANTITATIVE: D-Dimer, Quant: 5.36 ug/mL-FEU — ABNORMAL HIGH (ref 0.00–0.50)

## 2019-04-17 LAB — MAGNESIUM: Magnesium: 2.8 mg/dL — ABNORMAL HIGH (ref 1.7–2.4)

## 2019-04-17 LAB — C-REACTIVE PROTEIN: CRP: 2 mg/dL — ABNORMAL HIGH (ref <1.0)

## 2019-04-17 MED ORDER — CARVEDILOL 12.5 MG PO TABS
25.0000 mg | ORAL_TABLET | Freq: Two times a day (BID) | ORAL | Status: DC
  Administered 2019-04-17: 09:00:00 25 mg via ORAL
  Filled 2019-04-17: qty 2

## 2019-04-17 MED ORDER — DIGOXIN 0.25 MG/ML IJ SOLN
0.2500 mg | Freq: Three times a day (TID) | INTRAMUSCULAR | Status: DC | PRN
  Filled 2019-04-17: qty 1

## 2019-04-17 MED ORDER — METOPROLOL TARTRATE 5 MG/5ML IV SOLN
5.0000 mg | Freq: Once | INTRAVENOUS | Status: AC
  Administered 2019-04-17: 15:00:00 5 mg via INTRAVENOUS
  Filled 2019-04-17: qty 5

## 2019-04-17 MED ORDER — DIGOXIN 0.25 MG/ML IJ SOLN
0.2500 mg | Freq: Three times a day (TID) | INTRAMUSCULAR | Status: DC | PRN
  Administered 2019-04-17: 12:00:00 0.25 mg via INTRAVENOUS
  Filled 2019-04-17 (×2): qty 1

## 2019-04-17 MED ORDER — METOPROLOL TARTRATE 50 MG PO TABS
100.0000 mg | ORAL_TABLET | Freq: Two times a day (BID) | ORAL | Status: DC
  Administered 2019-04-17 – 2019-04-18 (×3): 100 mg via ORAL
  Filled 2019-04-17 (×3): qty 2

## 2019-04-17 MED ORDER — METOPROLOL TARTRATE 5 MG/5ML IV SOLN: 5.0000 mg | Freq: Four times a day (QID) | INTRAVENOUS | Status: DC | PRN

## 2019-04-17 MED ORDER — POTASSIUM CHLORIDE CRYS ER 20 MEQ PO TBCR
20.0000 meq | EXTENDED_RELEASE_TABLET | Freq: Once | ORAL | Status: AC
  Administered 2019-04-17: 10:00:00 20 meq via ORAL
  Filled 2019-04-17: qty 1

## 2019-04-17 NOTE — Progress Notes (Signed)
PT Cancellation Note  Patient Details Name: Dylan Goodman MRN: [id number] DOB: [birth year]   Cancelled Treatment:    Reason Eval/Treat Not Completed: Medical issues which prohibited therapy  At rest, talking calmly on the phone with HR 146 bpm. Observed for several minutes with range 138-150 bpm. PT deferred and RN made aware. Pt denied symptoms.     Barry Brunner, PT      Rexanne Mano 04/17/2019, 11:56 AM

## 2019-04-17 NOTE — Care Management Important Message (Signed)
Important Message  Patient Details  Name: Dylan Goodman MRN: [id number] Date of Birth: March 03, 1951   Medicare Important Message Given:  Yes - Important Message mailed due to current National Emergency  Verbal consent obtained due to current National Emergency  Relationship to patient: Spouse/Significant Other Contact Name: Roshaun Pound Call Date: 04/17/19  Time: 1442 Phone: [phone] Outcome: Spoke with contact Important Message mailed to: Other (must enter comment)(emialed to [email or web address])   Raymie Trani P Undra Trembath 04/17/2019, 2:44 PM

## 2019-04-17 NOTE — Plan of Care (Addendum)
Patient HR maintaining in afib and low to mid 140s. Aysymptomatic and has no chest pain.  Dr. Text to inform. Orders placed and given  Patient given Digoxin IV about 1230.  HR (now -1400) maintaining 115 - mid 120s. Still asymptomatic and no chest pain (per patient).

## 2019-04-17 NOTE — Plan of Care (Signed)
Spoke to son and gave update, per patient's request. Attempted to call wife 2x (home and cell), but both continued to ring with no answer.

## 2019-04-17 NOTE — Progress Notes (Addendum)
PROGRESS NOTE  Dylan Goodman  ELF:[id number] DOB: 10-02-1950 DOA: 04/13/2019 PCP: Steele Sizer, MD  Outpatient Specialists: [facility] multiple providers Brief Narrative: Dylan Goodman is a 68 y.o. 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 CRP elevated at 11.9, d-dimer 10.01. Creatinine also elevated at 5.93 in the setting of multiple metabolic derangements including Na 131, K 5.2, bicarbonate 12, calcium 5, anion gap 16, magnesium 0.6, AST 50. Steroids and remdesivir were started, 4LPM O2 given, and the patient was admitted to [facility] for management of covid-19 pneumonia and AKI.   Assessment & Plan: Principal Problem:   Acute renal failure superimposed on stage 4 chronic kidney disease (HCC) Active Problems:   Chronic diarrhea of unknown origin   Chronic systolic CHF (congestive heart failure), NYHA class 2 (HCC)   Coronary artery disease involving coronary bypass graft of native heart   Hypomagnesemia   Menetrier's disease (hyperplastic hypersecretory gastropathy)   Type 2 diabetes mellitus with stage 4 chronic kidney disease, with long-term current use of insulin (HCC)   Hypocalcemia   Hyponatremia   Metabolic acidosis   Unspecified atrial fibrillation ([place])   COVID-19 virus infection   Acute respiratory failure with hypoxia ([place])  Acute hypoxic respiratory failure due to covid-19 pneumonitis: Hypoxia resolved as of 9/16.  - Complete remdesivir 9/14 - 9/18 - Continue steroids, dose decreased 9/16 due to hyperglycemia and improved symptoms. Will hold future doses (has received 6 days) due to suspicion that this is precipitating/contributing to RVR and hyperglycemia.  - Continue airborne, contact precautions. PPE including surgical  gown, gloves, cap, shoe covers, and CAPR used during this encounter in a negative pressure room.  - Check daily labs: CBC w/diff, CMP, d-dimer, ferritin, CRP. CRP declining 11.9 > 2. D-dimer elevated but trending downward. Pt is on therapeutic anticoagulation and has no symptoms of LE DVT and hypoxia improving. He will continue this after discharge. - Avoid NSAIDs - Recommend proning and aggressive use of incentive spirometry. - Goals of care were discussed. Prognosis is guarded.   AKI on stage IV CKD: Improved back to baseline as of 9/17 and continues to improve 9/18. Renal U/S with echogenic kidneys bilaterally without hydronephrosis. No proteinuria, +hyaline casts argues for prerenal injury. - Continue holding losartan  CAD s/p CABG with angina, PAD: Troponin minimally elevated and downtrending with no ECG changes not consistent with ACS.  - Continue beta blocker, and antianginals including imdur, prn NTG. - On pletal, brilinta, eliquis, and statin as well.   Chronic AFib, now with RVR: Remains elevated/worsening, though asymptomatic.  - Increased coreg to 12.5mg  BID with limited improvement then to 25mg  po BID with limited improvement. D/w cardiology, Dr. Sallyanne Kuster, who recommends switching coreg to metoprolol, dose equivalent 100mg  po BID to decrease alpha effects and improve cardioselectivity in pt on symbicort. Will also give 5mg  IV x1 now and prn. Avoiding diltiazem due to LV systolic dysfunction. Digoxin considered, though renal dysfunction makes this a poor long term choice. - Continue eliquis  - Continue telemetry.  - Will need to follow up with cardiology, Dr. Nehemiah Massed as an outpatient.   Chronic HFrEF: EF 35%. Appears euvolemic.  - Continue home beta blocker, antiplatelet, imdur. Holding ARB w/AKI.   IDT2DM:  - Continue long acting and bolus  insulin, anticipate improved control with tapering steroids. Pt has voracious appetite.  Metabolic acidosis: Stable - Restarted home  bicarbonate  Hypomagnesemia:  - Supplemented, replete at 2.2.  Hypokalemia:  - Supplement again today with goal ~4, refractory in setting of chronic diarrhea.   Hypocalcemia: Improving with constant replacement. Level is 7.5, corrects to 8.6, still low.   - Supplement again today and continue monitoring.   Hypoalbuminemia likely due to Menetrier's disease:  - Supplement as able, pt has good appetite.  - Follow up with [facility] for further evaluation.  Hyperlipidemia:  - Continue statin, fenofibrate, ezetimibe.   Chronic diarrhea:  - Supportive care  DVT prophylaxis: Eliquis Code Status: Full Family Communication: None at bedside Disposition Plan: Was hoping for discharge today, though AFib w/RVR remains/worsening requiring continued telemetry monitoring and medication adjustments.   Consultants:   Cardiology, Dr. Sallyanne Kuster by phone 04/17/2019  Procedures:   None  Antimicrobials:  Vancomycin, cefepime, flagyl x1 9/13  Remdesivir 9/14 - 9/18  Subjective: "I feel fine." Sleeping well, eating voraciously, no dyspnea at rest or with exertion when walking 490ft w/PT yesterday. SpO2 remained >92% on room air though heart rate continues to surge into 140's with any exertion, and remains in 110-120's at rest. He denies any chest pain or palpitations.  Objective: Vitals:   04/17/19 0400 04/17/19 0500 04/17/19 0700 04/17/19 0743  BP: 90/63 107/72 112/77 (!) 147/96  Pulse: (!) 105 98 (!) 110 (!) 108  Resp: 14 14 16 17   Temp: 98.2 F (36.8 C)  98.4 F (36.9 C)   TempSrc:   Oral Oral  SpO2: 91% 93% (!) 89% 94%  Weight:      Height:        Intake/Output Summary (Last 24 hours) at 04/17/2019 1121 Last data filed at 04/17/2019 0300 Gross per 24 hour  Intake 840 ml  Output 1900 ml  Net -1060 ml   Filed Weights   04/13/19 0208  Weight: 97.5 kg   Gen: 68 y.o. male in no distress Pulm: Nonlabored breathing room air. Clear. CV: Rapid irregular. No definite murmur, rub, or  gallop. No JVD, no dependent edema. GI: Abdomen soft, non-tender, non-distended, with normoactive bowel sounds.  Ext: Warm, no deformities Skin: No new rashes, lesions or ulcers on visualized skin. Neuro: Alert and oriented. No focal neurological deficits. Psych: Judgement and insight appear fair. Mood euthymic & affect congruent. Behavior is appropriate.    Data Reviewed: I have personally reviewed following labs and imaging studies  CBC: Recent Labs  Lab 04/12/19 1658 04/14/19 0215 04/16/19 0213  WBC 5.3 3.1* 9.0  NEUTROABS  --  2.6  --   HGB 12.4* 11.9* 10.9*  HCT 38.0* 36.2* 33.4*  MCV 88.4 89.4 90.5  PLT 184 167 409   Basic Metabolic Panel: Recent Labs  Lab 04/13/19 0230 04/14/19 0215 04/14/19 1304 04/15/19 0158 04/16/19 0213 04/17/19 0144  NA 138 139  --  136 137 136  K 3.7 3.3*  --  3.5 3.6 3.4*  CL 108 105  --  105 107 107  CO2 14* 20*  --  19* 20* 20*  GLUCOSE 78 194*  --  340* 231* 251*  BUN 85* 73*  --  70* 67* 67*  CREATININE 5.03* 3.52*  --  2.86* 2.52* 2.28*  CALCIUM 5.3* 5.6*  --  6.2* 6.9* 7.5*  MG 0.8* 0.6* 1.9 2.0 1.2* 2.8*  PHOS  --   --   --  2.4*  --   --  GFR: Estimated Creatinine Clearance: 39.1 mL/min (A) (by C-G formula based on SCr of 2.28 mg/dL (H)). Liver Function Tests: Recent Labs  Lab 04/13/19 0230 04/14/19 0215 04/15/19 0158 04/16/19 0213 04/17/19 0144  AST 37 37 37 28 31  ALT 24 24 24 23 26   ALKPHOS 55 57 55 46 50  BILITOT 0.4 0.3 0.5 0.4 0.4  PROT 5.8* 5.9* 5.5* 5.3* 5.3*  ALBUMIN 2.9* 2.8* 2.7* 2.5* 2.6*   No results for input(s): LIPASE, AMYLASE in the last 168 hours. No results for input(s): AMMONIA in the last 168 hours. Coagulation Profile: No results for input(s): INR, PROTIME in the last 168 hours. Cardiac Enzymes: No results for input(s): CKTOTAL, CKMB, CKMBINDEX, TROPONINI in the last 168 hours. BNP (last 3 results) No results for input(s): PROBNP in the last 8760 hours. HbA1C: No results for input(s):  HGBA1C in the last 72 hours. CBG: Recent Labs  Lab 04/16/19 0806 04/16/19 1208 04/16/19 1717 04/16/19 2151 04/17/19 0742  GLUCAP 191* 207* 265* 365* 215*   Lipid Profile: No results for input(s): CHOL, HDL, LDLCALC, TRIG, CHOLHDL, LDLDIRECT in the last 72 hours. Thyroid Function Tests: No results for input(s): TSH, T4TOTAL, FREET4, T3FREE, THYROIDAB in the last 72 hours. Anemia Panel: Recent Labs    04/15/19 0158 04/16/19 0213  FERRITIN 906* 747*   Urine analysis:    Component Value Date/Time   COLORURINE YELLOW (A) 04/12/2019 2214   APPEARANCEUR CLEAR (A) 04/12/2019 2214   LABSPEC 1.006 04/12/2019 2214   PHURINE 5.0 04/12/2019 2214   GLUCOSEU NEGATIVE 04/12/2019 2214   HGBUR SMALL (A) 04/12/2019 2214   BILIRUBINUR NEGATIVE 04/12/2019 2214   [place] 04/12/2019 2214   PROTEINUR NEGATIVE 04/12/2019 2214   NITRITE NEGATIVE 04/12/2019 2214   LEUKOCYTESUR NEGATIVE 04/12/2019 2214   Recent Results (from the past 240 hour(s))  SARS Coronavirus 2 [facility] order, Performed in [place] hospital lab) Nasopharyngeal Nasopharyngeal Swab     Status: Abnormal   Collection Time: 04/12/19  6:04 PM   Specimen: Nasopharyngeal Swab  Result Value Ref Range Status   SARS Coronavirus 2 POSITIVE (A) NEGATIVE Final    Comment: RESULT CALLED TO, READ BACK BY AND VERIFIED WITH: TOM NAGY AT 1950 04/12/2019 [facility] (NOTE) If result is NEGATIVE SARS-CoV-2 target nucleic acids are NOT DETECTED. The SARS-CoV-2 RNA is generally detectable in upper and lower  respiratory specimens during the acute phase of infection. The lowest  concentration of SARS-CoV-2 viral copies this assay can detect is 250  copies / mL. A negative result does not preclude SARS-CoV-2 infection  and should not be used as the sole basis for treatment or other  patient management decisions.  A negative result may occur with  improper specimen collection / handling, submission of specimen other  than nasopharyngeal  swab, presence of viral mutation(s) within the  areas targeted by this assay, and inadequate number of viral copies  (<250 copies / mL). A negative result must be combined with clinical  observations, patient history, and epidemiological information. If result is POSITIVE SARS-CoV-2 target nucleic acids are DETECTED. The S ARS-CoV-2 RNA is generally detectable in upper and lower  respiratory specimens during the acute phase of infection.  Positive  results are indicative of active infection with SARS-CoV-2.  Clinical  correlation with patient history and other diagnostic information is  necessary to determine patient infection status.  Positive results do  not rule out bacterial infection or co-infection with other viruses. If result is PRESUMPTIVE POSTIVE SARS-CoV-2 nucleic acids  MAY BE PRESENT.   A presumptive positive result was obtained on the submitted specimen  and confirmed on repeat testing.  While 2019 novel coronavirus  (SARS-CoV-2) nucleic acids may be present in the submitted sample  additional confirmatory testing may be necessary for epidemiological  and / or clinical management purposes  to differentiate between  SARS-CoV-2 and other Sarbecovirus currently known to infect humans.  If clinically indicated additional testing with an alternate test  methodology [id number]) is adv ised. The SARS-CoV-2 RNA is generally  detectable in upper and lower respiratory specimens during the acute  phase of infection. The expected result is Negative. Fact Sheet for Patients:  StrictlyIdeas.no Fact Sheet for Healthcare Providers: BankingDealers.co.za This test is not yet approved or cleared by the [place] FDA and has been authorized for detection and/or diagnosis of SARS-CoV-2 by FDA under an Emergency Use Authorization (EUA).  This EUA will remain in effect (meaning this test can be used) for the duration of the COVID-19 declaration  under Section 564(b)(1) of the Act, 21 U.S.C. section 360bbb-3(b)(1), unless the authorization is terminated or revoked sooner. Performed at [facility], [address] [place], [place] [address]   Blood culture (routine x 2)     Status: None   Collection Time: 04/12/19  7:01 PM   Specimen: BLOOD  Result Value Ref Range Status   Specimen Description BLOOD LEFT HAND  Final   Special Requests   Final    BOTTLES DRAWN AEROBIC AND ANAEROBIC Blood Culture results may not be optimal due to an inadequate volume of blood received in culture bottles   Culture   Final    NO GROWTH 5 DAYS Performed at [facility], [address] [place], [place] [address]    Report Status 04/17/2019 FINAL  Final  Blood culture (routine x 2)     Status: None   Collection Time: 04/12/19  7:01 PM   Specimen: BLOOD  Result Value Ref Range Status   Specimen Description BLOOD LEFT ASSIST CONTROL  Final   Special Requests   Final    BOTTLES DRAWN AEROBIC AND ANAEROBIC Blood Culture results may not be optimal due to an excessive volume of blood received in culture bottles   Culture   Final    NO GROWTH 5 DAYS Performed at [facility], [address] [place],  [address]    Report Status 04/17/2019 FINAL  Final      Radiology Studies: No results found.  Scheduled Meds: . apixaban  5 mg Oral BID  . calcium carbonate  1 tablet Oral TID WC  . carvedilol  25 mg Oral BID  . cilostazol  100 mg Oral BID  . dexamethasone  6 mg Oral QHS  . insulin aspart  0-5 Units Subcutaneous QHS  . insulin aspart  0-9 Units Subcutaneous TID WC  . insulin aspart  4 Units Subcutaneous TID WC  . insulin detemir  10 Units Subcutaneous BID  . isosorbide dinitrate  40 mg Oral TID PC  . mouth rinse  15 mL Mouth Rinse BID  . pantoprazole  40 mg Oral Daily  . pravastatin  10 mg Oral q1800  . sodium bicarbonate  650 mg Oral Daily  . sodium chloride flush  3 mL Intravenous Q12H  . ticagrelor   60 mg Oral BID   Continuous Infusions:    LOS: 4 days   Time spent: 35 minutes.  Patrecia Pour, MD [facility] [email or web address] Password [facility] 04/17/2019, 11:21  AM  

## 2019-04-17 NOTE — Progress Notes (Signed)
Discussed difficulty in management of AFib w RVR w Dr. Bonner Puna. Despite max dose carvedilol and one dose of digoxin the ventricular rate still hits the low 140s. Digoxin unlikely to show benefit until loaded with a full 1 mg, but a suboptimal solution as far as side effect/toxicity risk due to CKD stage 4. Amiodarone would help, but would take a long time to kick in and is also not a great choice due to toxicity. Trying to avoid diltiazem due to low LVEF. Recommend switching from carvedilol to metoprolol, with more AV blocking effect for the same BP lowering effect. Can give iv metoprolol 5 mg now, then 100 mg ,etoprolol tartrate twice daily.Would be glad to continue to help with management if necessary.  Sanda Klein, MD, [facility] [phone] office [phone] pager

## 2019-04-17 NOTE — Progress Notes (Signed)
PT Cancellation Note  Patient Details Name: Zacory Fiola MRN: [id number] DOB: 07-May-1951   Cancelled Treatment:    Reason Eval/Treat Not Completed: Fatigue/lethargy limiting ability to participate  Patient sleeping soundly (not easily awakened). Observed HR via telemetry and ranging 102-124 bpm while sleeping. Deferred PT    Barry Brunner, PT      Rexanne Mano 04/17/2019, 4:27 PM

## 2019-04-18 LAB — BASIC METABOLIC PANEL
Anion gap: 8 (ref 5–15)
BUN: 65 mg/dL — ABNORMAL HIGH (ref 8–23)
CO2: 22 mmol/L (ref 22–32)
Calcium: 7.9 mg/dL — ABNORMAL LOW (ref 8.9–10.3)
Chloride: 109 mmol/L (ref 98–111)
Creatinine, Ser: 1.93 mg/dL — ABNORMAL HIGH (ref 0.61–1.24)
GFR calc Af Amer: 41 mL/min — ABNORMAL LOW (ref >60)
GFR calc non Af Amer: 36 mL/min — ABNORMAL LOW (ref >60)
Glucose, Bld: 190 mg/dL — ABNORMAL HIGH (ref 70–99)
Potassium: 4.1 mmol/L (ref 3.5–5.1)
Sodium: 139 mmol/L (ref 135–145)

## 2019-04-18 LAB — TSH: TSH: 1.491 u[IU]/mL (ref 0.350–4.500)

## 2019-04-18 LAB — GLUCOSE, CAPILLARY: Glucose-Capillary: 148 mg/dL — ABNORMAL HIGH (ref 70–99)

## 2019-04-18 LAB — C-REACTIVE PROTEIN: CRP: 1.1 mg/dL — ABNORMAL HIGH (ref <1.0)

## 2019-04-18 MED ORDER — METOPROLOL TARTRATE 100 MG PO TABS: 100.0000 mg | ORAL_TABLET | Freq: Two times a day (BID) | ORAL | 0 refills | Status: DC

## 2019-04-18 MED ORDER — APIXABAN 5 MG PO TABS: 5.0000 mg | ORAL_TABLET | Freq: Two times a day (BID) | ORAL | 0 refills | Status: DC

## 2019-04-18 NOTE — Progress Notes (Signed)
Patient left via personal vehicle at this time. No acute distress noted.

## 2019-04-18 NOTE — Discharge Summary (Signed)
Physician Discharge Summary  Dylan Goodman PJK:[id number] DOB: 11/14/1950 DOA: 04/13/2019  PCP: Steele Sizer, MD  Admit date: 04/13/2019 Discharge date: 04/18/2019  Admitted From: Home Disposition: Home   Recommendations for Outpatient Follow-up:  1. Follow up with PCP in 2 weeks 2. Follow up with cardiology in the next 1-2 weeks for continued management of AFib. 3. Coreg changed to metoprolol with improved rate control of AFib. Due to improved CrCl, and not meeting weight or age requirements for decreased dosing, eliquis dose was increased 2.5mg  BID > 5mg  BID. 4. Please obtain CMP/CBC in one week  Home Health: HH-PT Equipment/Devices: Rolling walker if needed Discharge Condition: Stable CODE STATUS: Full Diet recommendation: Heart healthy, carb-modified  Brief/Interim Summary: Dylan Goodman is a 68 y.o. 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 CRP elevated at 11.9, d-dimer 10.01. Creatinine also elevated at 5.93 in the setting of multiple metabolic derangements including Na 131, K 5.2, bicarbonate 12, calcium 5, anion gap 16, magnesium 0.6, AST 50. Steroids and remdesivir were started, 4LPM O2 given, and the patient was admitted to [facility] for management of covid-19 pneumonia and AKI. With IV fluids, renal function improved, hypoxia resolved, and the patient appeared stable for discharge but developed rapid atrial fibrillation for which medication changes were made with improvement.   Discharge Diagnoses:  Principal Problem:   Acute renal failure superimposed on stage 4 chronic kidney disease (HCC) Active Problems:   Chronic diarrhea of unknown origin   Chronic systolic CHF (congestive heart failure), NYHA class 2 (HCC)    Coronary artery disease involving coronary bypass graft of native heart   Hypomagnesemia   Menetrier's disease (hyperplastic hypersecretory gastropathy)   Type 2 diabetes mellitus with stage 4 chronic kidney disease, with long-term current use of insulin (HCC)   Hypocalcemia   Hyponatremia   Metabolic acidosis   Unspecified atrial fibrillation ([place])   COVID-19 virus infection   Acute respiratory failure with hypoxia ([place])  Acute hypoxic respiratory failure due to covid-19 pneumonitis: Hypoxia resolved as of 9/16.  - Complete remdesivir 9/14 - 9/18 - Continue steroids, dose decreased 9/16 due to hyperglycemia and improved symptoms. Will hold future doses (has received 6 days) due to suspicion that this is precipitating/contributing to RVR and hyperglycemia.  - Continue airborne, contact precautions. PPE including surgical gown, gloves, cap, shoe covers, and CAPR used during this encounter in a negative pressure room.  - Check daily labs: CBC w/diff, CMP, d-dimer, ferritin, CRP. CRP declining 11.9 > 1.1. D-dimer elevated but trending downward. Pt is on therapeutic anticoagulation and has no symptoms of LE DVT and hypoxia resolved. He will continue this after discharge. - Avoid NSAIDs - Recommend proning and aggressive use of incentive spirometry. - Goals of care were discussed. Prognosis is guarded.   AKI on stage IV CKD: Improved back to baseline as of 9/17 and continues to improve 9/18 and 9/19 without IV fluids. Renal U/S with echogenic kidneys bilaterally without hydronephrosis. No proteinuria, +hyaline casts argues for prerenal injury. - CrCl at discharge is above stated baseline at 23ml/min. Can continue home medications with increased eliquis dosing, and routine nephrology/PCP monitoring.  CAD s/p CABG with angina, PAD: Troponin minimally elevated and downtrending with no ECG changes not consistent with ACS.  -  Continue beta blocker, and antianginals including imdur, prn NTG. - On  pletal, brilinta, eliquis, and statin as well.   Chronic AFib, now with RVR: RVR since resolved, sustaining HR in low 100's, remains asymptomatic.  - Increased coreg to 12.5mg  BID then to 25mg  po BID with limited improvement. D/w cardiology, Dr. Sallyanne Kuster, who recommends switching coreg to metoprolol, dose equivalent 100mg  po BID to decrease alpha effects and improve cardioselectivity. Longterm amiodarone toxicity appears prohibitive. Avoiding diltiazem due to LV systolic dysfunction. Digoxin considered, though renal dysfunction makes this a poor long term choice. - Continue eliquis, increase dose - Will need to follow up with cardiology and PCP closely for continue management.   Chronic HFrEF: EF 35%. Appears euvolemic.  - Continue beta blocker, ARB, antiplatelet, imdur.    IDT2DM: HbA1c 6.7%.  - Continue home medication regimen. nticipate improved control with tapering steroids.    Metabolic acidosis: Stable - Continue home bicarbonate  Hypomagnesemia:  - Supplemented, replete at 2.2.  Hypokalemia:  - Supplemented and resolved at 4.1. Recommend recheck at follow up.  Hypocalcemia: Improving with constant replacement.  - Supplemented and normalized when corrected for hypoalbuminemia.   Hypoalbuminemia likely due to Menetrier's disease:  - Supplement as able, pt has good appetite.  - Follow up with [facility] for further evaluation.  Hyperlipidemia:  - Continue statin, fenofibrate, ezetimibe.   Chronic diarrhea:  - Supportive care  Discharge Instructions Discharge Instructions    Diet - low sodium heart healthy   Complete by: As directed    Diet Carb Modified   Complete by: As directed    Discharge instructions   Complete by: As directed    You are being discharged from the hospital after treatment for covid-19 infection. You are felt to be stable enough to no longer require inpatient monitoring, testing, and treatment, though you will need to follow the recommendations  below: - Continue taking eliquis but increase the dose to 5mg  twice daily (from 2.5mg  twice daily) this is because your weight and age do not meet cutoffs to reduce the dose from the usual 5mg  dose. - STOP coreg and REPLACE IT with METOPROLOL 100mg  twice daily in order to improve the control of your heart rate. Both metoprolol and the new eliquis prescription were sent [facility].  - You have completed treatment for covid. - Remain in self-isolation for 14 days following discharge to reduce risk of transmission of the virus.  - Do not take NSAID medications (including, but not limited to, ibuprofen, advil, motrin, naproxen, aleve, goody's powder, etc.) - Follow up with your doctor in the next week via telehealth or seek medical attention right away if your symptoms get WORSE.  - Consider donating plasma after you have recovered (either 14 days after a negative test or 28 days after symptoms have completely resolved) because your antibodies to this virus may be helpful to give to others with life-threatening infections. Please go to the website www.[email or web address] if you would like to consider volunteering for plasma donation.    Directions for you at home:  Wear a facemask You should wear a facemask that covers your nose and mouth when you are in the same room with other people and when you visit a healthcare provider. People who live with or visit you should also wear a facemask while they are in the same room with you.  Separate yourself from other people in your home As much as possible, you should stay in a different room from other  people in your home. Also, you should use a separate bathroom, if available.  Avoid sharing household items You should not share dishes, drinking glasses, cups, eating utensils, towels, bedding, or other items with other people in your home. After using these items, you should wash them thoroughly with soap and water.  Cover your coughs and  sneezes Cover your mouth and nose with a tissue when you cough or sneeze, or you can cough or sneeze into your sleeve. Throw used tissues in a lined trash can, and immediately wash your hands with soap and water for at least 20 seconds or use an alcohol-based hand rub.  Wash your [facility] your hands often and thoroughly with soap and water for at least 20 seconds. You can use an alcohol-based hand sanitizer if soap and water are not available and if your hands are not visibly dirty. Avoid touching your eyes, nose, and mouth with unwashed hands.  Directions for those who live with, or provide care at home for you:  Limit the number of people who have contact with the patient If possible, have only one caregiver for the patient. Other household members should stay in another home or place of residence. If this is not possible, they should stay in another room, or be separated from the patient as much as possible. Use a separate bathroom, if available. Restrict visitors who do not have an essential need to be in the home.  Ensure good ventilation Make sure that shared spaces in the home have good air flow, such as from an air conditioner or an opened window, weather permitting.  Wash your hands often Wash your hands often and thoroughly with soap and water for at least 20 seconds. You can use an alcohol based hand sanitizer if soap and water are not available and if your hands are not visibly dirty. Avoid touching your eyes, nose, and mouth with unwashed hands. Use disposable paper towels to dry your hands. If not available, use dedicated cloth towels and replace them when they become wet.  Wear a facemask and gloves Wear a disposable facemask at all times in the room and gloves when you touch or have contact with the patient's blood, body fluids, and/or secretions or excretions, such as sweat, saliva, sputum, nasal mucus, vomit, urine, or feces.  Ensure the mask fits over your nose and  mouth tightly, and do not touch it during use. Throw out disposable facemasks and gloves after using them. Do not reuse. Wash your hands immediately after removing your facemask and gloves. If your personal clothing becomes contaminated, carefully remove clothing and launder. Wash your hands after handling contaminated clothing. Place all used disposable facemasks, gloves, and other waste in a lined container before disposing them with other household waste. Remove gloves and wash your hands immediately after handling these items.  Do not share dishes, glasses, or other household items with the patient Avoid sharing household items. You should not share dishes, drinking glasses, cups, eating utensils, towels, bedding, or other items with a patient who is confirmed to have, or being evaluated for, COVID-19 infection. After the person uses these items, you should wash them thoroughly with soap and water.  Wash laundry thoroughly Immediately remove and wash clothes or bedding that have blood, body fluids, and/or secretions or excretions, such as sweat, saliva, sputum, nasal mucus, vomit, urine, or feces, on them. Wear gloves when handling laundry from the patient. Read and follow directions on labels of laundry or clothing items and  detergent. In general, wash and dry with the warmest temperatures recommended on the label.  Clean all areas the individual has used often Clean all touchable surfaces, such as counters, tabletops, doorknobs, bathroom fixtures, toilets, phones, keyboards, tablets, and bedside tables, every day. Also, clean any surfaces that may have blood, body fluids, and/or secretions or excretions on them. Wear gloves when cleaning surfaces the patient has come in contact with. Use a diluted bleach solution (e.g., dilute bleach with 1 part bleach and 10 parts water) or a household disinfectant with a label that says EPA-registered for coronaviruses. To make a bleach solution at home,  add 1 tablespoon of bleach to 1 quart (4 cups) of water. For a larger supply, add  cup of bleach to 1 gallon (16 cups) of water. Read labels of cleaning products and follow recommendations provided on product labels. Labels contain instructions for safe and effective use of the cleaning product including precautions you should take when applying the product, such as wearing gloves or eye protection and making sure you have good ventilation during use of the product. Remove gloves and wash hands immediately after cleaning.  Monitor yourself for signs and symptoms of illness Caregivers and household members are considered close contacts, should monitor their health, and will be asked to limit movement outside of the home to the extent possible. Follow the monitoring steps for close contacts listed on the symptom monitoring form.  If you have additional questions, contact your local health department or call the epidemiologist on call at [phone] (available 24/7). This guidance is subject to change. For the most up-to-date guidance from [facility], please refer to their website: YouBlogs.pl   Increase activity slowly   Complete by: As directed      Allergies as of 04/18/2019   No Known Allergies     Medication List    STOP taking these medications   amLODipine 5 MG tablet Commonly known as: NORVASC   carvedilol 6.25 MG tablet Commonly known as: COREG     TAKE these medications   apixaban 5 MG Tabs tablet Commonly known as: ELIQUIS Take 1 tablet (5 mg total) by mouth 2 (two) times daily. What changed:   medication strength  how much to take   budesonide-formoterol 160-4.5 MCG/ACT inhaler Commonly known as: Symbicort Inhale 2 puffs into the lungs 2 (two) times daily. Rinse mouth after use   Calcium 600-D 600-400 MG-UNIT Tabs Generic drug: Calcium Carbonate-Vitamin D3 Take 1 tablet by mouth daily.   cilostazol 100  MG tablet Commonly known as: PLETAL Take 100 mg by mouth 2 (two) times daily.   colchicine 0.6 MG tablet Take 0.6 mg by mouth once.   diazepam 2 MG tablet Commonly known as: Valium Take 1 tablet (2 mg total) by mouth every 12 (twelve) hours as needed (vertigo). What changed:   how much to take  when to take this   diphenoxylate-atropine 2.5-0.025 MG tablet Commonly known as: LOMOTIL Take 1 tablet by mouth 4 (four) times daily as needed for diarrhea or loose stools.   ezetimibe 10 MG tablet Commonly known as: ZETIA Take 10 mg by mouth daily.   fenofibrate 160 MG tablet Take 160 mg by mouth daily.   isosorbide mononitrate 120 MG 24 hr tablet Commonly known as: IMDUR Take 120 mg by mouth daily.   lansoprazole 30 MG capsule Commonly known as: PREVACID Take 30 mg by mouth every morning.   losartan 25 MG tablet Commonly known as: COZAAR Take 25 mg by mouth  daily.   lovastatin 40 MG tablet Commonly known as: MEVACOR Take 40 mg by mouth 2 (two) times daily.   meclizine 12.5 MG tablet Commonly known as: ANTIVERT Take 1 tablet (12.5 mg total) by mouth 3 (three) times daily as needed for dizziness. What changed: Another medication with the same name was removed. Continue taking this medication, and follow the directions you see here.   metoprolol tartrate 100 MG tablet Commonly known as: LOPRESSOR Take 1 tablet (100 mg total) by mouth 2 (two) times daily.   nitroGLYCERIN 0.4 MG SL tablet Commonly known as: NITROSTAT Place 0.4 mg under the tongue every 5 (five) minutes as needed for chest pain.   sodium bicarbonate 650 MG tablet Take 1 tablet by mouth daily.   ticagrelor 60 MG Tabs tablet Commonly known as: BRILINTA Take 60 mg by mouth 2 (two) times daily.   Tyler Aas FlexTouch 200 UNIT/ML Sopn Generic drug: Insulin Degludec Inject 12 Units into the skin at bedtime.   Vitamin D3 25 MCG (1000 UT) Caps Take by mouth.      Follow-up Information    Steele Sizer, MD. Schedule an appointment as soon as possible for a visit in 1 week(s).   Specialty: Family Medicine Contact information: [address] [place] [address] [phone]        Corey Skains, MD Follow up.   Specialty: Cardiology Contact information: [address] [place] [place] [address] [phone]        Corey Skains, MD .   Specialty: Cardiology Contact information: [address] [facility] [place] [address] [phone]          No Known Allergies  Consultations:  Cardiology, Dr. Sallyanne Kuster by phone 9/18  Procedures/Studies: Ct Head Wo Contrast  Result Date: 04/12/2019 CLINICAL DATA:  Ataxia. Sudden onset of dizziness today. EXAM: CT HEAD WITHOUT CONTRAST TECHNIQUE: Contiguous axial images were obtained from the base of the skull through the vertex without intravenous contrast. COMPARISON:  MRI of the brain dated 03/12/2019 FINDINGS: Brain: No evidence of acute infarction, hemorrhage, hydrocephalus, extra-axial collection or mass lesion/mass effect. Mild diffuse atrophy. Vascular: No hyperdense vessel. Dense calcification in the distal vertebral arteries. Skull: Normal. Negative for fracture or focal lesion. Sinuses/Orbits: No acute finding. Other: None IMPRESSION: No acute abnormalities. Mild diffuse atrophy. Dense calcification in the distal vertebral arteries. Electronically Signed   By: Lorriane Shire M.D.   On: 04/12/2019 17:56   Ct Abdomen Pelvis W Contrast  Result Date: 04/05/2019 CLINICAL DATA:  Nausea and vomiting EXAM: CT ABDOMEN AND PELVIS WITH CONTRAST TECHNIQUE: Multidetector CT imaging of the abdomen and pelvis was performed using the standard protocol following bolus administration of intravenous contrast. CONTRAST:  59mL OMNIPAQUE IOHEXOL 300 MG/ML  SOLN COMPARISON:  03/12/2019 FINDINGS: Lower chest: No acute abnormality. Hepatobiliary: Stable 1.9 cm  cyst at the right hepatic dome. Liver otherwise unremarkable. Gallbladder unremarkable. No intrahepatic biliary dilatation. Pancreas: Unremarkable. No pancreatic ductal dilatation or surrounding inflammatory changes. Spleen: Normal in size without focal abnormality. Adrenals/Urinary Tract: Unremarkable adrenal glands. Mild prominence of the bilateral renal pelvises without hydronephrosis. Somewhat irregular nodular appearance within the midpole of the right kidney (series 5, image 78). Urinary bladder appears unremarkable. Stomach/Bowel: Marked diffuse gastric wall thickening. Small bowel is well opacified with enteric contrast. No dilated loops. Extensive sigmoid diverticulosis. No focal colonic wall thickening or pericolonic inflammatory changes. Vascular/Lymphatic: Redemonstration of saccular aneurysm chest distal to the take-off of the renal arteries  measuring 3.8 cm (series 5, image 68), unchanged. Fusiform infrarenal abdominal aortic aneurysm measuring approximately 3.8 cm, also unchanged. Metallic streak artifact from intravascular coils within the left hemipelvis. Extensive aortoiliac atherosclerosis. No abdominopelvic lymphadenopathy. Reproductive: Similar prostatomegaly. Other: No abdominal wall hernia or abnormality. No abdominopelvic ascites. Musculoskeletal: No acute or significant osseous findings. IMPRESSION: 1. 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 2. Extensive colonic diverticulosis without evidence of acute diverticulitis. 3. Subtle contour abnormality of the mid pole of the right kidney, which may represent a prominent renal lobulation. An underlying mass in this location is difficult to exclude. Correlation with nonemergent renal ultrasound is recommended. 4. 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  Recommend followup by ultrasound in 2 years. This recommendation follows ACR consensus guidelines: White Paper of the ACR Incidental Findings Committee II on Vascular Findings. J Am Coll Radiol 2013; 10:789-794. Electronically Signed   By: Davina Poke M.D.   On: 04/05/2019 16:31   US Renal  Result Date: 04/13/2019 CLINICAL DATA:  Acute on chronic renal failure. EXAM: RENAL / URINARY TRACT ULTRASOUND COMPLETE COMPARISON:  None. FINDINGS: Right Kidney: Renal measurements: 10.3 x 6.4 x 5.4 cm = volume: 188 mL. Slightly increased echogenicity of renal parenchyma is noted. No mass or hydronephrosis visualized. Left Kidney: Renal measurements: 12.5 x 6.2 x 5.6 cm = volume: 227 mL. Slightly increased echogenicity of renal parenchyma is noted. No mass or hydronephrosis visualized. Bladder: Appears normal for degree of bladder distention. Bilateral ureteral jets are noted. IMPRESSION: Slightly increased echogenicity of renal parenchyma is noted bilaterally suggesting medical renal disease. No hydronephrosis or renal obstruction is noted. Electronically Signed   By: Marijo Conception M.D.   On: 04/13/2019 13:36   Ct Chest High Resolution  Result Date: 03/24/2019 CLINICAL DATA:  Interstitial lung disease, worsening shortness of breath, wheezing EXAM: CT CHEST WITHOUT CONTRAST TECHNIQUE: Multidetector CT imaging of the chest was performed following the standard protocol without intravenous contrast. High resolution imaging of the lungs, as well as inspiratory and expiratory imaging, was performed. COMPARISON:  01/27/2019 FINDINGS: Cardiovascular: Aortic atherosclerosis. 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 There is partially imaged aneurysm of the abdominal aorta measuring at least 4.1 x 3.9 cm. Normal heart size. Extensive 3 vessel coronary artery calcifications and/or stents. No pericardial effusion. Mediastinum/Nodes: No enlarged mediastinal, hilar, or axillary lymph  nodes. Thyroid gland, trachea, and esophagus demonstrate no significant findings. Lungs/Pleura: Moderate centrilobular and paraseptal emphysema. Diffuse bilateral bronchial wall thickening. No evidence of fibrotic interstitial lung disease. No significant air trapping on expiratory phase imaging. No pleural effusion or pneumothorax. Upper Abdomen: No acute abnormality. Musculoskeletal: No chest wall mass or suspicious bone lesions identified. IMPRESSION: 1. No evidence of fibrotic interstitial lung disease. No significant air trapping on expiratory phase imaging. 2.  Emphysema. 3. Diffuse bilateral bronchial wall thickening, consistent with smoking-related respiratory bronchiolitis. 4. Aortic atherosclerosis. 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 There is partially imaged aneurysm of the abdominal aorta measuring at least 4.1 x 3.9 cm. 5.  Coronary artery disease. Electronically Signed   By: Eddie Candle M.D.   On: 03/24/2019 13:29   Dg Chest Port 1 View  Result Date: 04/13/2019 CLINICAL DATA:  67 year old male with a history of dyspnea EXAM: PORTABLE CHEST 1 VIEW COMPARISON:  April 12, 2019, CT March 24, 2019 FINDINGS: Cardiomediastinal silhouette unchanged in size and contour. No evidence of central vascular congestion. Surgical changes of median sternotomy. Similar appearance of reticulonodular opacities of the bilateral lungs with no new confluent airspace disease pneumothorax or pleural effusion. IMPRESSION: Similar appearance of chronic interstitial opacities favored to represent chronic lung disease, however, superimposed atypical infection cannot be excluded. No evidence of lobar pneumonia. Surgical changes of median sternotomy and CABG Electronically Signed   By: Corrie Mckusick D.O.   On: 04/13/2019 08:35   Dg Chest Portable 1 View  Result Date: 04/12/2019 CLINICAL DATA:  Fever and chills. Acute onset of dizziness. EXAM: PORTABLE CHEST 1 VIEW  COMPARISON:  03/18/2019 FINDINGS: Heart size and pulmonary vascularity are normal. Aortic atherosclerosis. Chronic accentuation of the interstitial markings. No infiltrates or effusions. No acute bone abnormality. IMPRESSION: No acute cardiopulmonary abnormality. Chronic interstitial lung disease. Aortic atherosclerosis. Electronically Signed   By: Lorriane Shire M.D.   On: 04/12/2019 18:19     Subjective: Feels great, wants to go home. Does not have any chest pain, dyspnea or palpitations at rest or with exertion. No leg swelling, or other complaints.  Discharge Exam:  04/18/19 0730  BP: (!) 156/91  Pulse: 98  Resp: 20  Temp: 98 F (36.7 C)  SpO2: 94%   General: Pt is alert, awake, not in acute distress Cardiovascular: Irreg irreg S1/S2 +, no rubs, no gallops Respiratory: CTA bilaterally, no wheezing, no rhonchi Abdominal: Soft, NT, ND, bowel sounds + Extremities: No edema, no cyanosis  Labs: BNP (last 3 results) Recent Labs    03/12/19 0714  BNP 29.5   Basic Metabolic Panel: Recent Labs  Lab 04/14/19 0215 04/14/19 1304 04/15/19 0158 04/16/19 0213 04/17/19 0144 04/18/19 0156  NA 139  --  136 137 136 139  K 3.3*  --  3.5 3.6 3.4* 4.1  CL 105  --  105 107 107 109  CO2 20*  --  19* 20* 20* 22  GLUCOSE 194*  --  340* 231* 251* 190*  BUN 73*  --  70* 67* 67* 65*  CREATININE 3.52*  --  2.86* 2.52* 2.28* 1.93*  CALCIUM 5.6*  --  6.2* 6.9* 7.5* 7.9*  MG 0.6* 1.9 2.0 1.2* 2.8*  --   PHOS  --   --  2.4*  --   --   --    Liver Function Tests: Recent Labs  Lab 04/13/19 0230 04/14/19 0215 04/15/19 0158 04/16/19 0213 04/17/19 0144  AST 37 37 37 28 31  ALT 24 24 24 23 26   ALKPHOS 55 57 55 46 50  BILITOT 0.4 0.3 0.5 0.4 0.4  PROT 5.8* 5.9* 5.5* 5.3* 5.3*  ALBUMIN 2.9* 2.8* 2.7* 2.5* 2.6*   No results for input(s): LIPASE, AMYLASE in the last 168 hours. No results for input(s): AMMONIA in the last 168 hours. CBC: Recent Labs  Lab 04/12/19 1658 04/14/19 0215  04/16/19 0213  WBC 5.3 3.1* 9.0  NEUTROABS  --  2.6  --   HGB 12.4* 11.9* 10.9*  HCT 38.0* 36.2* 33.4*  MCV 88.4 89.4 90.5  PLT 184 167 201   Cardiac Enzymes: No results for input(s): CKTOTAL, CKMB, CKMBINDEX, TROPONINI in the last 168 hours. BNP: Invalid input(s): POCBNP CBG: Recent Labs  Lab 04/16/19 2151 04/17/19 0742 04/17/19 1649 04/17/19 2105 04/18/19 0727  GLUCAP 365* 215* 231* 194* 148*   D-Dimer Recent Labs    04/16/19 0213 04/17/19 0144  DDIMER 6.10* 5.36*  Hgb A1c No results for input(s): HGBA1C in the last 72 hours. Lipid Profile No results for input(s): CHOL, HDL, LDLCALC, TRIG, CHOLHDL, LDLDIRECT in the last 72 hours. Thyroid function studies Recent Labs    04/18/19 0156  TSH 1.491   Anemia work up Recent Labs    04/16/19 0213  FERRITIN 747*   Urinalysis    Component Value Date/Time   COLORURINE YELLOW (A) 04/12/2019 2214   APPEARANCEUR CLEAR (A) 04/12/2019 2214   LABSPEC 1.006 04/12/2019 2214   PHURINE 5.0 04/12/2019 2214   GLUCOSEU NEGATIVE 04/12/2019 2214   HGBUR SMALL (A) 04/12/2019 2214   [place] NEGATIVE 04/12/2019 2214   [place] NEGATIVE 04/12/2019 2214   PROTEINUR NEGATIVE 04/12/2019 2214   NITRITE NEGATIVE 04/12/2019 2214   LEUKOCYTESUR NEGATIVE 04/12/2019 2214    Microbiology Recent Results (from the past 240 hour(s))  SARS Coronavirus 2 [facility] order, Performed in [facility] hospital lab) Nasopharyngeal Nasopharyngeal Swab     Status: Abnormal   Collection Time: 04/12/19  6:04 PM   Specimen: Nasopharyngeal Swab  Result Value Ref Range Status   SARS Coronavirus 2 POSITIVE (A) NEGATIVE Final    Comment: RESULT CALLED TO, READ BACK BY AND VERIFIED WITH: TOM NAGY AT 1950 04/12/2019 K[facility] (NOTE) If result is NEGATIVE SARS-CoV-2 target nucleic acids are NOT DETECTED. The SARS-CoV-2 RNA is generally detectable in upper and lower  respiratory specimens during the acute phase of infection. The lowest  concentration of  SARS-CoV-2 viral copies this assay can detect is 250  copies / mL. A negative result does not preclude SARS-CoV-2 infection  and should not be used as the sole basis for treatment or other  patient management decisions.  A negative result may occur with  improper specimen collection / handling, submission of specimen other  than nasopharyngeal swab, presence of viral mutation(s) within the  areas targeted by this assay, and inadequate number of viral copies  (<250 copies / mL). A negative result must be combined with clinical  observations, patient history, and epidemiological information. If result is POSITIVE SARS-CoV-2 target nucleic acids are DETECTED. The S ARS-CoV-2 RNA is generally detectable in upper and lower  respiratory specimens during the acute phase of infection.  Positive  results are indicative of active infection with SARS-CoV-2.  Clinical  correlation with patient history and other diagnostic information is  necessary to determine patient infection status.  Positive results do  not rule out bacterial infection or co-infection with other viruses. If result is PRESUMPTIVE POSTIVE SARS-CoV-2 nucleic acids MAY BE PRESENT.   A presumptive positive result was obtained on the submitted specimen  and confirmed on repeat testing.  While 2019 novel coronavirus  (SARS-CoV-2) nucleic acids may be present in the submitted sample  additional confirmatory testing may be necessary for epidemiological  and / or clinical management purposes  to differentiate between  SARS-CoV-2 and other Sarbecovirus currently known to infect humans.  If clinically indicated additional testing with an alternate test  methodology [phone]) is adv ised. The SARS-CoV-2 RNA is generally  detectable in upper and lower respiratory specimens during the acute  phase of infection. The expected result is Negative. Fact Sheet for Patients:  StrictlyIdeas.no Fact Sheet for Healthcare  Providers: BankingDealers.co.za This test is not yet approved or cleared by the [place] FDA and has been authorized for detection and/or diagnosis of SARS-CoV-2 by FDA under an Emergency Use Authorization (EUA).  This EUA will remain in effect (meaning this test can be used) for the duration  of the COVID-19 declaration under Section 564(b)(1) of the Act, 21 U.S.C. section 360bbb-3(b)(1), unless the authorization is terminated or revoked sooner. Performed at [facility], [address] [place], [place] [address]   Blood culture (routine x 2)     Status: None   Collection Time: 04/12/19  7:01 PM   Specimen: BLOOD  Result Value Ref Range Status   Specimen Description BLOOD LEFT HAND  Final   Special Requests   Final    BOTTLES DRAWN AEROBIC AND ANAEROBIC Blood Culture results may not be optimal due to an inadequate volume of blood received in culture bottles   Culture   Final    NO GROWTH 5 DAYS Performed at [facility], [address] [place], [place] [address]    Report Status 04/17/2019 FINAL  Final  Blood culture (routine x 2)     Status: None   Collection Time: 04/12/19  7:01 PM   Specimen: BLOOD  Result Value Ref Range Status   Specimen Description BLOOD LEFT ASSIST CONTROL  Final   Special Requests   Final    BOTTLES DRAWN AEROBIC AND ANAEROBIC Blood Culture results may not be optimal due to an excessive volume of blood received in culture bottles   Culture   Final    NO GROWTH 5 DAYS Performed at [facility], [address] [place], [place] [address]    Report Status 04/17/2019 FINAL  Final    Time coordinating discharge: Approximately 40 minutes  Patrecia Pour, MD  [facility] 04/18/2019, 2:30 PM

## 2019-04-18 NOTE — Plan of Care (Signed)
Will continue with plan of care. 

## 2019-04-18 NOTE — Progress Notes (Signed)
Discharge instructions given to patient and patient verified understanding. [facility] stating that [facility] are ready for pickup. Wife informed and on her way. No acute distress noted.

## 2019-04-20 ENCOUNTER — Telehealth: Payer: Self-pay

## 2019-04-20 LAB — GLUCOSE, CAPILLARY: Glucose-Capillary: 211 mg/dL — ABNORMAL HIGH (ref 70–99)

## 2019-04-20 NOTE — Telephone Encounter (Signed)
Attempted to reach patient for TCM call and schedule hospital follow up appt. Left msg for patient to contact me directly at [phone] or call the office.

## 2019-04-20 NOTE — Telephone Encounter (Signed)
Called and spoke to pt, and requested that upcoming appt for 04/21/19 be changed to another provider.  Pt requested that 04/21/2019 be canceled and he will call back to reschedule.

## 2019-04-21 ENCOUNTER — Encounter: Payer: Medicare Other | Admitting: Physical Therapy

## 2019-04-21 ENCOUNTER — Ambulatory Visit: Payer: Federal, State, Local not specified - PPO | Admitting: Internal Medicine

## 2019-04-27 ENCOUNTER — Encounter: Payer: Self-pay | Admitting: Gastroenterology

## 2019-04-27 ENCOUNTER — Encounter: Payer: Self-pay | Admitting: *Deleted

## 2019-04-27 ENCOUNTER — Other Ambulatory Visit
Admission: RE | Admit: 2019-04-27 | Discharge: 2019-04-27 | Disposition: A | Discharge status: Home / Self Care | Payer: Medicare Other | Attending: Gastroenterology | Admitting: Gastroenterology

## 2019-04-27 ENCOUNTER — Ambulatory Visit (INDEPENDENT_AMBULATORY_CARE_PROVIDER_SITE_OTHER): Payer: Medicare Other | Admitting: Gastroenterology

## 2019-04-27 ENCOUNTER — Other Ambulatory Visit: Payer: Self-pay

## 2019-04-27 VITALS — BP 106/68 | HR 98 | Temp 97.3°F | Ht 72.0 in | Wt 201.8 lb

## 2019-04-27 DIAGNOSIS — K529 Noninfective gastroenteritis and colitis, unspecified: Secondary | ICD-10-CM

## 2019-04-27 DIAGNOSIS — R197 Diarrhea, unspecified: Secondary | ICD-10-CM | POA: Diagnosis not present

## 2019-04-27 DIAGNOSIS — I6523 Occlusion and stenosis of bilateral carotid arteries: Secondary | ICD-10-CM

## 2019-04-27 NOTE — Progress Notes (Signed)
Gastroenterology Consultation  Referring Provider:     Steele Sizer, MD Primary Care Physician:  Dylan Sizer, MD Primary Gastroenterologist:  Dr. Allen Goodman     Reason for Consultation:     Diarrhea and weight loss        HPI:   Goodman Dylan is a 68 y.o. y/o male referred for consultation & management of diarrhea and weight loss by Dylan Goodman, Drue Stager, MD.  This patient comes in today with a history of being seen in the past by Dylan Goodman and his PA.  The patient has a history of being treated for Menetrier's  disease.  The patient was found to have stomach biopsies that were not consistent with this.  The patient also had a colonoscopy with multiple polyps 6 of which appear to have been adenomatous.  The patient now reports that he lost approximately 20 pounds in the last 2-1/2 to 3 months he has also had diarrhea with the diarrhea waking him up from sleep.  Most commonly the patient has 4 bowel movements that usually happen in the morning and have been watery to soft.  The patient has had this diarrhea for the last 3 years.  But only started to have the weight loss recently.  There is no history of abdominal pain associated with the diarrhea.  The patient also denies any black stools or bloody stools.  He does use a lot of sweeteners with his drinks and also has a diet high in dairy products mostly being cheese.  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   Chronic depression 09/03/2018   CKD (chronic kidney disease) 09/03/2018   Coronary atherosclerosis 09/03/2018   Coronary bypass graft mechanical complication 30/8657   Diabetes mellitus without complication ([place])    Gastric ulcer with hemorrhage 09/03/2018   Gastritis 09/03/2018   GERD (gastroesophageal reflux disease) 09/03/2018   Heart disease    Heart murmur    IDA (iron deficiency anemia)  09/03/2018   Low magnesium levels 09/03/2018   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   Myocardial infarction [facility]) 2017   Personal history of radiation therapy 05/30/2018   39 treatments with chemotherapy nasopharyngeal cancer   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    Past Surgical History:  Procedure Laterality Date   CAROTID ENDARTERECTOMY  2006   COLONOSCOPY WITH PROPOFOL N/A 03/11/2019   Procedure: COLONOSCOPY WITH PROPOFOL;  Surgeon: Goodman, Dylan Pike, MD;  Location: [facility] ENDOSCOPY;  Service: Gastroenterology;  Laterality: N/A;   CORONARY ARTERY BYPASS GRAFT  1996   quadruple bypass   ESOPHAGOGASTRODUODENOSCOPY (EGD) WITH PROPOFOL N/A 03/11/2019   Procedure: ESOPHAGOGASTRODUODENOSCOPY (EGD) WITH PROPOFOL;  Surgeon: Goodman, Dylan Pike, MD;  Location: [facility] ENDOSCOPY;  Service: Gastroenterology;  Laterality: N/A;   heart stent  10/2010   x3   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 Authorizing Provider  apixaban (ELIQUIS) 5 MG TABS tablet  Take 1 tablet (5 mg total) by mouth 2 (two) times daily. 04/18/19  Yes Patrecia Pour, MD  Calcium Carbonate-Vitamin D3 (CALCIUM 600-D) 600-400 MG-UNIT TABS Take 1 tablet by mouth daily.   Yes [provider]  Cholecalciferol (VITAMIN D3) 25 MCG (1000 UT) CAPS Take by mouth.   Yes [provider]  cilostazol (PLETAL) 100 MG tablet Take 100 mg by mouth 2 (two) times daily.    Yes [provider]  diphenoxylate-atropine (LOMOTIL) 2.5-0.025 MG tablet Take 1 tablet by mouth 4 (four) times daily  as needed for diarrhea or loose stools.   Yes [provider]  ezetimibe (ZETIA) 10 MG tablet Take 10 mg by mouth daily.  07/07/18  Yes [provider]  fenofibrate 160 MG tablet Take 160 mg by mouth daily.    Yes [provider]  isosorbide mononitrate (IMDUR) 120 MG 24 hr tablet Take 120 mg by mouth daily.   Yes [provider]  lansoprazole (PREVACID) 30 MG capsule Take 30 mg by mouth every morning.    Yes [provider]  losartan (COZAAR) 25 MG tablet Take 25 mg by mouth daily.  08/29/18  Yes [provider]  lovastatin (MEVACOR) 40 MG tablet Take 40 mg by mouth 2 (two) times daily.  07/01/18  Yes [provider]  nitroGLYCERIN (NITROSTAT) 0.4 MG SL tablet Place 0.4 mg under the tongue every 5 (five) minutes as needed for chest pain.  07/01/18  Yes [provider]  sodium bicarbonate 650 MG tablet Take 1 tablet by mouth daily.  12/31/18  Yes Goodman, Dylan Nida, MD  ticagrelor (BRILINTA) 60 MG TABS tablet Take 60 mg by mouth 2 (two) times daily.    Yes [provider]  budesonide-formoterol (SYMBICORT) 160-4.5 MCG/ACT inhaler Inhale 2 puffs into the lungs 2 (two) times daily. Rinse mouth after use Patient not taking: Reported on 04/01/2019 03/17/19   Laverle Hobby, MD  colchicine 0.6 MG tablet Take 0.6 mg by mouth once.    [provider]  diazepam (VALIUM) 2 MG tablet Take 1 tablet (2 mg total) by mouth every 12 (twelve) hours as needed (vertigo). Patient not taking: Reported on 04/27/2019 04/05/19 04/04/20  Dylan Polio, MD  Insulin Degludec (TRESIBA FLEXTOUCH) 200 UNIT/ML SOPN Inject 12 Units into the skin at bedtime.  05/17/18   [provider]  meclizine (ANTIVERT) 12.5 MG tablet Take 1 tablet (12.5 mg total) by mouth 3 (three) times daily as needed for dizziness. Patient not taking: Reported on 04/27/2019 04/05/19   Dylan Polio, MD  metoprolol tartrate (LOPRESSOR) 100 MG tablet Take 1 tablet (100  mg total) by mouth 2 (two) times daily. Patient not taking: Reported on 04/27/2019 04/18/19   Patrecia Pour, MD    Family History  Problem Relation Age of Onset   Coronary artery disease Mother    Alcohol abuse Father    Colon cancer Father    Coronary artery disease Father    Kidney cancer Father    Breast cancer Sister      Social History   Tobacco Use   Smoking status: Former Smoker    Packs/day: 1.50    Years: 50.00    Pack years: 75.00    Types: Cigarettes    Quit date: 02/18/2017    Years since quitting: 2.1   Smokeless tobacco: Never Used  Substance Use Topics   Alcohol use: Not Currently   Drug use: Never    Allergies as of 04/27/2019   (  No Known Allergies)    Review of Systems:    All systems reviewed and negative except where noted in HPI.   Physical Exam:  There were no vitals taken for this visit. No LMP for male patient. General:   Alert,  Well-developed, well-nourished, pleasant and cooperative in NAD Head:  Normocephalic and atraumatic. Eyes:  Sclera clear, no icterus.   Conjunctiva pink. Ears:  Normal auditory acuity. Nose:  No deformity, discharge, or lesions. Mouth:  No deformity or lesions,oropharynx pink & moist. Neck:  Supple; no masses or thyromegaly. Lungs:  Respirations even and unlabored.  Clear throughout to auscultation.   No wheezes, crackles, or rhonchi. No acute distress. Heart:  Regular rate and rhythm; no murmurs, clicks, rubs, or gallops. Abdomen:  Normal bowel sounds.  No bruits.  Soft, non-tender and non-distended without masses, hepatosplenomegaly or hernias noted.  No guarding or rebound tenderness.  Negative Carnett sign.   Rectal:  Deferred.  Msk:  Symmetrical without gross deformities.  Good, equal movement & strength bilaterally. Pulses:  Normal pulses noted. Extremities:  No clubbing or edema.  No cyanosis. Neurologic:  Alert and oriented x3;  grossly normal neurologically. Skin:  Intact without significant  lesions or rashes.  No jaundice. Lymph Nodes:  No significant cervical adenopathy. Psych:  Alert and cooperative. Normal mood and affect.  Imaging Studies: Ct Head Wo Contrast  Result Date: 04/12/2019 CLINICAL DATA:  Ataxia. Sudden onset of dizziness today. EXAM: CT HEAD WITHOUT CONTRAST TECHNIQUE: Contiguous axial images were obtained from the base of the skull through the vertex without intravenous contrast. COMPARISON:  MRI of the brain dated 03/12/2019 FINDINGS: Brain: No evidence of acute infarction, hemorrhage, hydrocephalus, extra-axial collection or mass lesion/mass effect. Mild diffuse atrophy. Vascular: No hyperdense vessel. Dense calcification in the distal vertebral arteries. Skull: Normal. Negative for fracture or focal lesion. Sinuses/Orbits: No acute finding. Other: None IMPRESSION: No acute abnormalities. Mild diffuse atrophy. Dense calcification in the distal vertebral arteries. Electronically Signed   By: Lorriane Shire M.D.   On: 04/12/2019 17:56   Ct Abdomen Pelvis W Contrast  Result Date: 04/05/2019 CLINICAL DATA:  Nausea and vomiting EXAM: CT ABDOMEN AND PELVIS WITH CONTRAST TECHNIQUE: Multidetector CT imaging of the abdomen and pelvis was performed using the standard protocol following bolus administration of intravenous contrast. CONTRAST:  23mL OMNIPAQUE IOHEXOL 300 MG/ML  SOLN COMPARISON:  03/12/2019 FINDINGS: Lower chest: No acute abnormality. Hepatobiliary: Stable 1.9 cm cyst at the right hepatic dome. Liver otherwise unremarkable. Gallbladder unremarkable. No intrahepatic biliary dilatation. Pancreas: Unremarkable. No pancreatic ductal dilatation or surrounding inflammatory changes. Spleen: Normal in size without focal abnormality. Adrenals/Urinary Tract: Unremarkable adrenal glands. Mild prominence of the bilateral renal pelvises without hydronephrosis. Somewhat irregular nodular appearance within the midpole of the right kidney (series 5, image 78). Urinary bladder appears  unremarkable. Stomach/Bowel: Marked diffuse gastric wall thickening. Small bowel is well opacified with enteric contrast. No dilated loops. Extensive sigmoid diverticulosis. No focal colonic wall thickening or pericolonic inflammatory changes. Vascular/Lymphatic: Redemonstration of saccular aneurysm chest distal to the take-off of the renal arteries measuring 3.8 cm (series 5, image 68), unchanged. Fusiform infrarenal abdominal aortic aneurysm measuring approximately 3.8 cm, also unchanged. Metallic streak artifact from intravascular coils within the left hemipelvis. Extensive aortoiliac atherosclerosis. No abdominopelvic lymphadenopathy. Reproductive: Similar prostatomegaly. Other: No abdominal wall hernia or abnormality. No abdominopelvic ascites. Musculoskeletal: No acute or significant osseous findings. IMPRESSION: 1. 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 2. Extensive colonic diverticulosis without evidence of acute diverticulitis. 3. Subtle contour abnormality of the mid pole of the right kidney, which may represent a prominent renal lobulation. An underlying mass in this location is difficult to exclude. Correlation with nonemergent renal ultrasound is recommended. 4. 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 Recommend followup by ultrasound in 2 years. This recommendation follows ACR consensus guidelines: White Paper of the ACR Incidental Findings Committee II on Vascular Findings. J Am Coll Radiol 2013; 10:789-794. Electronically Signed   By: Davina Poke M.D.   On: 04/05/2019 16:31   US Renal  Result Date: 04/13/2019 CLINICAL DATA:  Acute on chronic renal failure. EXAM: RENAL / URINARY TRACT ULTRASOUND COMPLETE COMPARISON:  None. FINDINGS: Right Kidney: Renal measurements: 10.3 x 6.4 x 5.4 cm = volume: 188 mL.  Slightly increased echogenicity of renal parenchyma is noted. No mass or hydronephrosis visualized. Left Kidney: Renal measurements: 12.5 x 6.2 x 5.6 cm = volume: 227 mL. Slightly increased echogenicity of renal parenchyma is noted. No mass or hydronephrosis visualized. Bladder: Appears normal for degree of bladder distention. Bilateral ureteral jets are noted. IMPRESSION: Slightly increased echogenicity of renal parenchyma is noted bilaterally suggesting medical renal disease. No hydronephrosis or renal obstruction is noted. Electronically Signed   By: Marijo Conception M.D.   On: 04/13/2019 13:36   Dg Chest Port 1 View  Result Date: 04/13/2019 CLINICAL DATA:  68 year old male with a history of dyspnea EXAM: PORTABLE CHEST 1 VIEW COMPARISON:  April 12, 2019, CT March 24, 2019 FINDINGS: Cardiomediastinal silhouette unchanged in size and contour. No evidence of central vascular congestion. Surgical changes of median sternotomy. Similar appearance of reticulonodular opacities of the bilateral lungs with no new confluent airspace disease pneumothorax or pleural effusion. IMPRESSION: Similar appearance of chronic interstitial opacities favored to represent chronic lung disease, however, superimposed atypical infection cannot be excluded. No evidence of lobar pneumonia. Surgical changes of median sternotomy and CABG Electronically Signed   By: Corrie Mckusick D.O.   On: 04/13/2019 08:35   Dg Chest Portable 1 View  Result Date: 04/12/2019 CLINICAL DATA:  Fever and chills. Acute onset of dizziness. EXAM: PORTABLE CHEST 1 VIEW COMPARISON:  03/18/2019 FINDINGS: Heart size and pulmonary vascularity are normal. Aortic atherosclerosis. Chronic accentuation of the interstitial markings. No infiltrates or effusions. No acute bone abnormality. IMPRESSION: No acute cardiopulmonary abnormality. Chronic interstitial lung disease. Aortic atherosclerosis. Electronically Signed   By: Lorriane Shire M.D.   On: 04/12/2019 18:19      Assessment and Plan:   Orien Mayhall is a 68 y.o. y/o male who comes in today with a history of being seen by Dylan Goodman and his PA and felt that he was not getting good service there and wanted a second opinion.  The patient had gone through an EGD and colonoscopy without any source of the diarrhea being seen.  He has a history of Menetrier's with treatment but his most recent biopsies were negative for this.  The patient will be set up for blood work for celiac sprue.  The patient also will be set up for a breath test for small intestinal bacterial overgrowth.  He did have a blood test that showed his chromogranin A to be over 7000 so a small bowel follow-through will be done to rule out any sign of carcinoid although his 5 HIAA was negative.  He will also have his stool sent off for fecal  fat and a GI panel.  The patient has also been told to stop dairy products for 1 week and if there is not improvement he can go back on dairy products and try to stop his sweeteners which may also contribute to his diarrhea.  The patient and the patient's wife have been explained the plan and agree with it.  Lucilla Lame, MD. [facility]    Note: This dictation was prepared with Dragon dictation along with smaller phrase technology. Any transcriptional errors that result from this process are unintentional.

## 2019-04-28 ENCOUNTER — Encounter: Payer: Federal, State, Local not specified - PPO | Admitting: Physical Therapy

## 2019-04-29 ENCOUNTER — Telehealth: Payer: Self-pay

## 2019-04-29 ENCOUNTER — Encounter: Payer: Self-pay | Admitting: Family Medicine

## 2019-04-29 ENCOUNTER — Other Ambulatory Visit: Payer: Self-pay

## 2019-04-29 ENCOUNTER — Ambulatory Visit (INDEPENDENT_AMBULATORY_CARE_PROVIDER_SITE_OTHER): Payer: Medicare Other | Admitting: Family Medicine

## 2019-04-29 VITALS — BP 113/73 | HR 90 | Wt 201.0 lb

## 2019-04-29 DIAGNOSIS — Z8616 Personal history of COVID-19: Secondary | ICD-10-CM

## 2019-04-29 DIAGNOSIS — Z09 Encounter for follow-up examination after completed treatment for conditions other than malignant neoplasm: Secondary | ICD-10-CM | POA: Diagnosis not present

## 2019-04-29 DIAGNOSIS — Z8619 Personal history of other infectious and parasitic diseases: Secondary | ICD-10-CM | POA: Diagnosis not present

## 2019-04-29 LAB — CELIAC DISEASE PANEL
Endomysial Ab, IgA: NEGATIVE
IgA: 215 mg/dL (ref 61–437)
Tissue Transglutaminase Ab, IgA: <2 U/mL (ref 0–3)

## 2019-04-29 NOTE — Telephone Encounter (Signed)
Left vm with celiac panel results.

## 2019-04-29 NOTE — Progress Notes (Signed)
Name: Dylan Goodman   MRN: [id number]    DOB: 04-Jul-1951   Date:04/29/2019       Progress Note  Subjective  Chief Complaint  Chief Complaint  Patient presents with  . Hospitalization Follow-up    Covid follow up    I connected with  Mitchel Honour  on 04/29/19 at  3:40 PM EDT by a video enabled telemedicine application and verified that I am speaking with the correct person using two identifiers.  I discussed the limitations of evaluation and management by telemedicine and the availability of in person appointments. The patient expressed understanding and agreed to proceed. Staff also discussed with the patient that there may be a patient responsible charge related to this service. Patient Location: at home Provider Location: [facility] discharge follow up: he went to [facility]2019 because of dizziness, cough  fever. He has been exposed to COVID-19. Upon arrival to [facility] he was febrile, hypoxic, and SOB. CXR did not show infiltrated, but he has elevated CRP and d-dimer . He was hypoxic and was placed on 4 liters of nasal [place] oxygen , given steroids and remdesivir. He had elevation of liver enzymes, drop of sodium and magnesium and acute kidney disease. Prior to his discharge he had rapid atrial fibrillation and cardiologist was consulted. Advised to change from Eliquis 2.5 mg to 5 mg BID and switched from coreg to metoprolol He was also advised to stop norvasc 5 mg but he is still taking medication. He states since he was discharged on 09/19 he has been feeling well, denies cough, sob, chest pain or palpitation. His appetite is normal.    Patient Active Problem List   Diagnosis Date Noted  . Acute renal failure superimposed on stage 4 chronic kidney disease ([place]) 04/13/2019  . Hypocalcemia 04/13/2019  . Hyponatremia 04/13/2019  . Metabolic acidosis 38/04/1750  . Unspecified atrial fibrillation ([place]) 04/13/2019  . COVID-19 virus infection  04/13/2019  . Acute respiratory failure with hypoxia ([place]) 04/13/2019  . Dizziness 03/12/2019  . Atherosclerosis of aorta ([place]) 01/29/2019  . Type 2 diabetes mellitus with stage 4 chronic kidney disease, with long-term current use of insulin ([place]) 09/13/2018  . Hyperlipidemia, mixed 09/13/2018  . Senile purpura ([place]) 09/08/2018  . Morbid obesity ([place]) 09/08/2018  . Chronic diarrhea of unknown origin 09/04/2018  . Menetrier's disease (hyperplastic hypersecretory gastropathy) 09/04/2018  . Chronic kidney disease 09/03/2018  . History of gastric ulcer 09/03/2018  . Gastritis 09/03/2018  . GERD (gastroesophageal reflux disease) 09/03/2018  . IDA (iron deficiency anemia) 09/03/2018  . Hypomagnesemia 09/03/2018  . History of cancer chemotherapy 09/03/2018  . Chronic systolic CHF (congestive heart failure), NYHA class 2 ([place]) 09/02/2018  . Atherosclerosis of native arteries of extremity with intermittent claudication ([place]) 07/21/2018  . Benign essential HTN 07/21/2018  . Bilateral carotid artery stenosis 07/21/2018  . 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 CAROTID ENDARTERECTOMY  2006  . COLONOSCOPY WITH PROPOFOL N/A 03/11/2019   Procedure: COLONOSCOPY WITH PROPOFOL;  Surgeon: Toledo, Benay Pike, MD;  Location: [facility] ENDOSCOPY;  Service: Gastroenterology;  Laterality: N/A;  . CORONARY ARTERY BYPASS GRAFT  1996   quadruple bypass  . ESOPHAGOGASTRODUODENOSCOPY (EGD) WITH PROPOFOL N/A 03/11/2019   Procedure: ESOPHAGOGASTRODUODENOSCOPY (EGD) WITH PROPOFOL;  Surgeon: Toledo, Benay Pike, MD;  Location: [facility] ENDOSCOPY;  Service: Gastroenterology;  Laterality: N/A;  . heart stent  10/2010   x3  . 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 left leg stents  08/2009  . LOWER EXTREMITY ANGIOGRAPHY Left 11/11/2018   Procedure: LOWER EXTREMITY ANGIOGRAPHY;  Surgeon: Katha Cabal, MD;  Location: [facility];  Service: Cardiovascular;  Laterality: Left;  . REVISION OF AORTA BIFEMORAL BYPASS    . right femo pop bypass  06/1998   11/2001, 08/2003  . right leg stent  07/2009    Family History  Problem Relation Age of Onset  . Coronary artery disease Mother   . Alcohol abuse Father   . Colon cancer Father   . Coronary artery disease Father   . Kidney cancer Father   . Breast cancer Sister     Social History   Socioeconomic History  . Marital status: Married    Spouse name: Not on file  . Number of children: 1  . Years of education: Not on file  . Highest education level: 11th grade  Occupational History  . Occupation: retired   Scientific laboratory technician  . Financial resource strain: Not hard at all  . Food insecurity    Worry: Never true    Inability: Never true  . Transportation needs    Medical: No    Non-medical: No  Tobacco Use  . Smoking status: Former Smoker    Packs/day: 1.50    Years: 50.00    Pack years: 75.00    Types: Cigarettes    Quit date: 02/18/2017    Years since quitting: 2.1  . Smokeless tobacco: Never Used  Substance and Sexual Activity  . Alcohol use: Not Currently  . Drug use: Never  . Sexual activity: Yes    Partners: Female  Lifestyle  . Physical activity    Days per week: 0 days    Minutes per session: 0 min  . Stress: Not at all  Relationships  . Social Herbalist on phone: Twice a week    Gets together: Once a week    Attends religious service: Never    Active member of club or organization: No    Attends meetings of clubs or organizations: Never    Relationship status: Married  . Intimate partner violence    Fear of current or ex partner: No    Emotionally abused: No    Physically abused: No    Forced sexual activity: No  Other Topics Concern  . Not on file  Social History Narrative   Moved here from [place], in 2019, re-married 03/2018   Had one son from previous marriage, he died one day after Christmas, on MVA at age  11 yo     Current Outpatient Medications:  .  apixaban (ELIQUIS) 5 MG TABS tablet, Take 1 tablet (5 mg total) by mouth 2 (two) times daily., Disp: 60 tablet, Rfl: 0 .  budesonide-formoterol (SYMBICORT) 160-4.5 MCG/ACT inhaler, Inhale 2 puffs into the lungs 2 (two) times daily. Rinse mouth after use, Disp: 1 Inhaler, Rfl: 12 .  Calcium Carbonate-Vitamin D3 (CALCIUM 600-D) 600-400 MG-UNIT TABS, Take 1 tablet by mouth daily., Disp: , Rfl:  .  Cholecalciferol (VITAMIN D3) 25 MCG (1000 UT) CAPS, Take by mouth., Disp: , Rfl:  .  cilostazol (PLETAL) 100 MG tablet, Take 100 mg by mouth 2 (two) times daily. , Disp: , Rfl:  .  colchicine 0.6 MG tablet, Take 0.6 mg by mouth once., Disp: , Rfl:  .  diphenoxylate-atropine (LOMOTIL) 2.5-0.025 MG  tablet, Take 1 tablet by mouth 4 (four) times daily as needed for diarrhea or loose stools., Disp: , Rfl:  .  ezetimibe (ZETIA) 10 MG tablet, Take 10 mg by mouth daily. , Disp: , Rfl:  .  fenofibrate 160 MG tablet, Take 160 mg by mouth daily. , Disp: , Rfl:  .  Insulin Degludec (TRESIBA FLEXTOUCH) 200 UNIT/ML SOPN, Inject 12 Units into the skin at bedtime. , Disp: , Rfl:  .  isosorbide mononitrate (IMDUR) 120 MG 24 hr tablet, Take 120 mg by mouth daily., Disp: , Rfl:  .  lansoprazole (PREVACID) 30 MG capsule, Take 30 mg by mouth every morning. , Disp: , Rfl:  .  losartan (COZAAR) 25 MG tablet, Take 25 mg by mouth daily. , Disp: , Rfl:  .  lovastatin (MEVACOR) 40 MG tablet, Take 40 mg by mouth 2 (two) times daily. , Disp: , Rfl:  .  meclizine (ANTIVERT) 12.5 MG tablet, Take 1 tablet (12.5 mg total) by mouth 3 (three) times daily as needed for dizziness., Disp: 30 tablet, Rfl: 0 .  metoprolol tartrate (LOPRESSOR) 100 MG tablet, Take 1 tablet (100 mg total) by mouth 2 (two) times daily., Disp: 60 tablet, Rfl: 0 .  nitroGLYCERIN (NITROSTAT) 0.4 MG SL tablet, Place 0.4 mg under the tongue every 5 (five) minutes as needed for chest pain. , Disp: , Rfl:  .  sodium  bicarbonate 650 MG tablet, Take 1 tablet by mouth daily. , Disp: , Rfl:  .  ticagrelor (BRILINTA) 60 MG TABS tablet, Take 60 mg by mouth 2 (two) times daily. , Disp: , Rfl:   No Known Allergies  I personally reviewed active problem list, medication list, allergies, family history, social history, health maintenance with the patient/caregiver today.   ROS   Ten systems reviewed and is negative except as mentioned in HPI  " I feel great"  Objective  Virtual encounter, vitals not obtained.  Body mass index is 27.26 kg/m.  Physical Exam  Awake, alert and oriented   Results for orders placed or performed during the hospital encounter of 04/27/19 (from the past 72 hour(s))  Celiac Disease Panel     Status: None   Collection Time: 04/27/19  3:26 PM  Result Value Ref Range   Endomysial Ab, IgA Negative Negative   Tissue Transglutaminase Ab, IgA <2 0 - 3 U/mL    Comment: (NOTE)                              Negative        0 -  3                              Weak Positive   4 - 10                              Positive           >10 Tissue Transglutaminase (tTG) has been identified as the endomysial antigen.  Studies have demonstr- ated that endomysial IgA antibodies have over 99% specificity for gluten sensitive enteropathy.    IgA 215 61 - 437 mg/dL    Comment: (NOTE) Performed At: [facility] [place], [place] [id number] Rush Farmer MD RC:[id number]     PHQ2/9: Depression screen [facility] 2/9 04/29/2019 04/01/2019 03/10/2019 02/12/2019 01/16/2019  Decreased Interest 0 0 0 0 0  Down, Depressed, Hopeless 0 0 0 0 0  PHQ - 2 Score 0 0 0 0 0  Altered sleeping 0 0 0 0 0  Tired, decreased energy 0 0 0 0 0  Change in appetite 0 0 0 0 0  Feeling bad or failure about yourself  0 0 0 0 0  Trouble concentrating 0 0 0 0 0  Moving slowly or fidgety/restless 0 0 0 0 0  Suicidal thoughts 0 0 0 0 0  PHQ-9 Score 0 0 0 0 0  Difficult doing work/chores - - - Not  difficult at all Not difficult at all   PHQ-2/9 Result is negative.    Fall Risk: Fall Risk  04/29/2019 04/01/2019 03/10/2019 02/12/2019 01/16/2019  Falls in the past year? 0 0 0 0 0  Number falls in past yr: 0 0 0 0 0  Injury with Fall? 0 0 0 0 0    Assessment & Plan  1. History of 2019 novel coronavirus disease (COVID-19)  - Comprehensive metabolic panel; Future - CBC with Differential/Platelet; Future - Magnesium; Future  2. Hospital discharge follow-up  Reviewed hospital notes. He is still taking amlodipine 5 mg, and bp is towards low end of normal but not low, we will adjust dose to 2.5 mg until he sees cardiologist. Also explained that he needs to go up on Eliquis to 5 mg BID instead of taking 2.5 mg Answered his questions.  He will get labs done tomorrow  - Comprehensive metabolic panel; Future - CBC with Differential/Platelet; Future - Magnesium; Future  3. Hypomagnesemia  - Magnesium; Future  I discussed the assessment and treatment plan with the patient. The patient was provided an opportunity to ask questions and all were answered. The patient agreed with the plan and demonstrated an understanding of the instructions.  The patient was advised to call back or seek an in-person evaluation if the symptoms worsen or if the condition fails to improve as anticipated.  I provided 25  minutes of non-face-to-face time during this encounter.

## 2019-04-29 NOTE — Telephone Encounter (Signed)
-----   Message from Lucilla Lame, MD sent at 04/29/2019  8:28 AM EDT ----- Let the patient know that his celiac sprue blood test showed no sign of wheat intolerance.

## 2019-05-01 ENCOUNTER — Other Ambulatory Visit
Admission: RE | Admit: 2019-05-01 | Discharge: 2019-05-01 | Disposition: A | Discharge status: Home / Self Care | Payer: Medicare Other | Source: Ambulatory Visit | Attending: Gastroenterology | Admitting: Gastroenterology

## 2019-05-01 ENCOUNTER — Ambulatory Visit
Admission: RE | Admit: 2019-05-01 | Discharge: 2019-05-01 | Disposition: A | Discharge status: Home / Self Care | Payer: Medicare Other | Source: Ambulatory Visit | Attending: Gastroenterology | Admitting: Gastroenterology

## 2019-05-01 ENCOUNTER — Other Ambulatory Visit: Payer: Self-pay

## 2019-05-01 DIAGNOSIS — R197 Diarrhea, unspecified: Secondary | ICD-10-CM | POA: Diagnosis present

## 2019-05-01 IMAGING — RF DG SMALL BOWEL
12 of 17 series · 12 of 17 positions shown · non-contrast
Comparison: CT [DATE]

CLINICAL DATA: Diarrhea

EXAM:
SMALL BOWEL SERIES
TECHNIQUE: Following ingestion of thin barium, serial small bowel images were
obtained including spot views of the terminal ileum.
FLUOROSCOPY TIME:  Fluoroscopy Time:  1 minutes 12 seconds
Radiation Exposure Index (if provided by the fluoroscopic device):
23.8 mGy
Number of Acquired Spot Images: 21

[Series 1: t abdomen supine · 0.14mm/px · 1 of 1 slices shown]
[im 1/1]
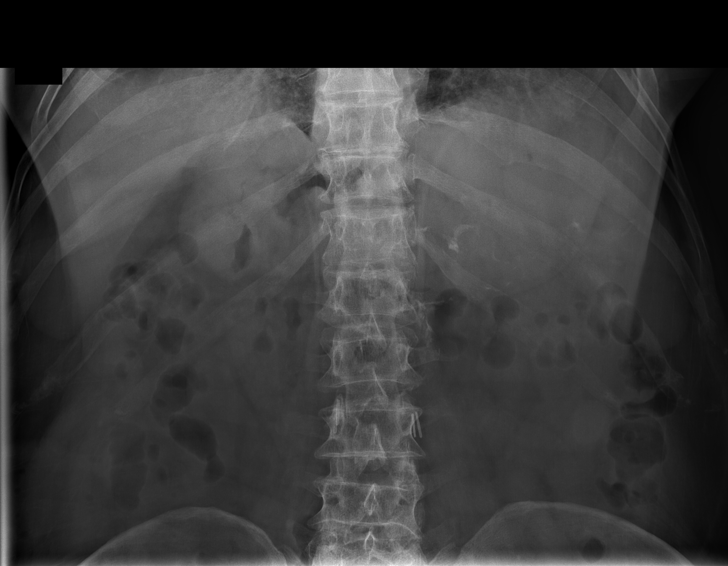

[Series 3: t abdomen barium ap · 0.14mm/px · 1 of 1 slices shown (1 of 7)]
[im 1/1]
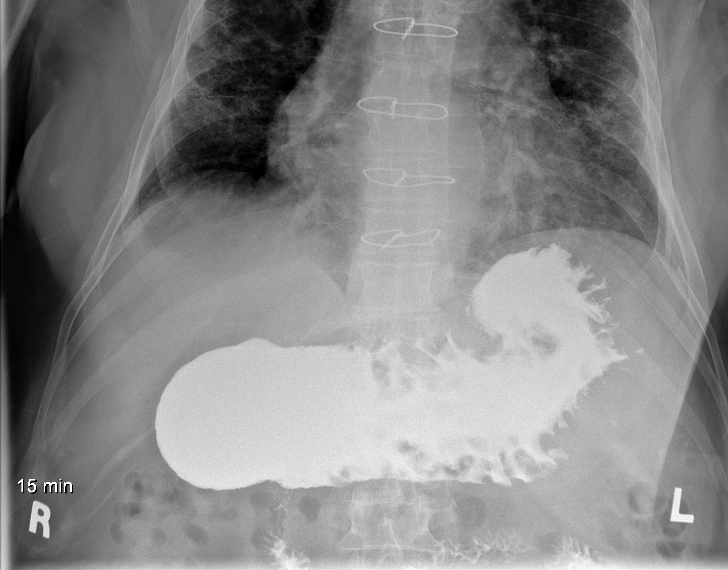

[Series 4: t abdomen barium ap · 0.14mm/px · 1 of 1 slices shown (2 of 7)]
[im 1/1]
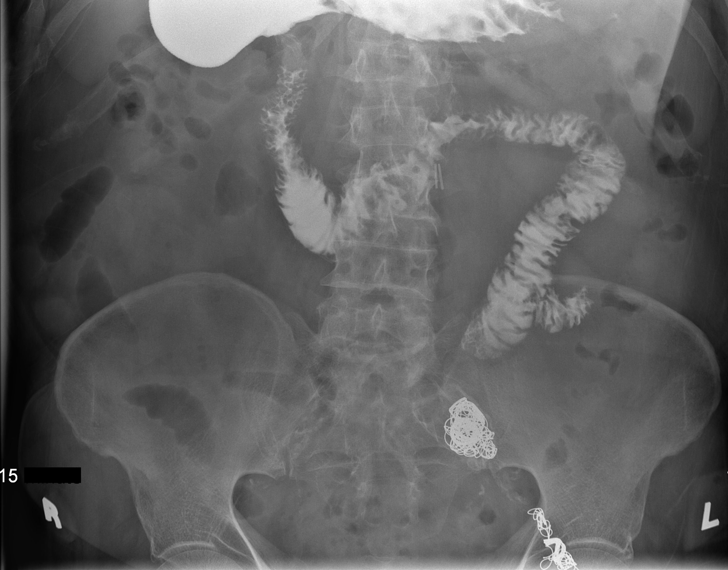

[Series 5: t abdomen barium ap · 0.14mm/px · 1 of 1 slices shown (3 of 7)]
[im 1/1]
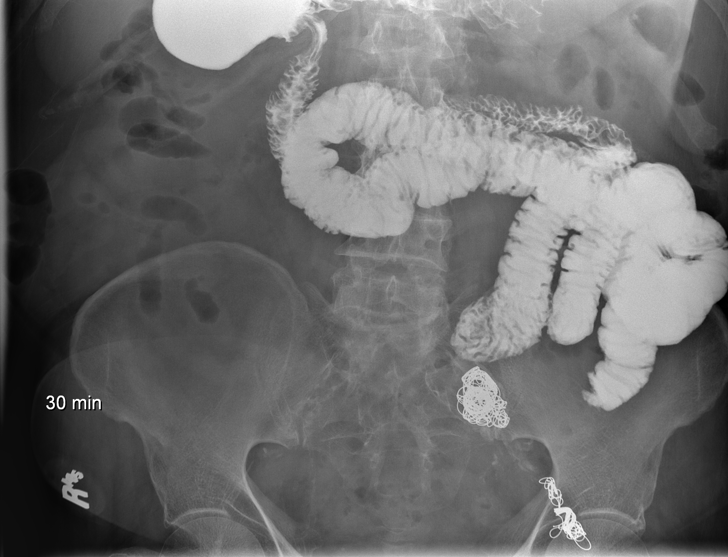

[Series 7: t abdomen barium ap · 0.14mm/px · 1 of 1 slices shown (4 of 7)]
[im 1/1]
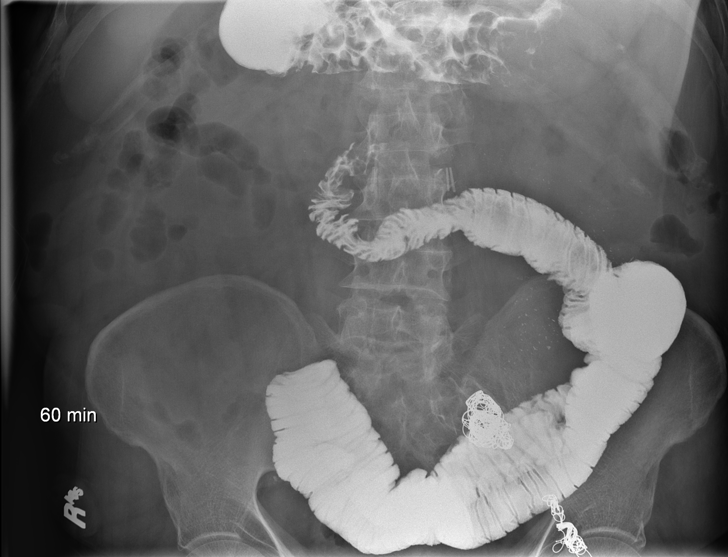

[Series 8: t abdomen barium ap · 0.14mm/px · 1 of 1 slices shown (5 of 7)]
[im 1/1]
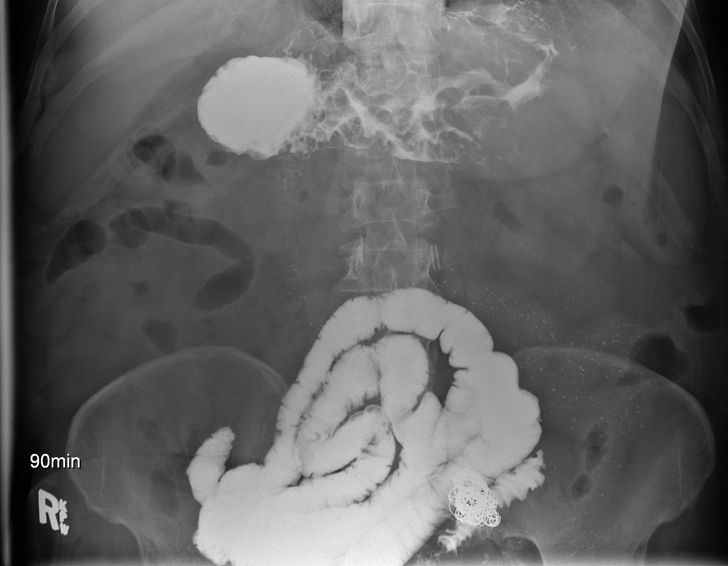

[Series 10: t abdomen barium ap · 0.14mm/px · 1 of 1 slices shown (6 of 7)]
[im 1/1]
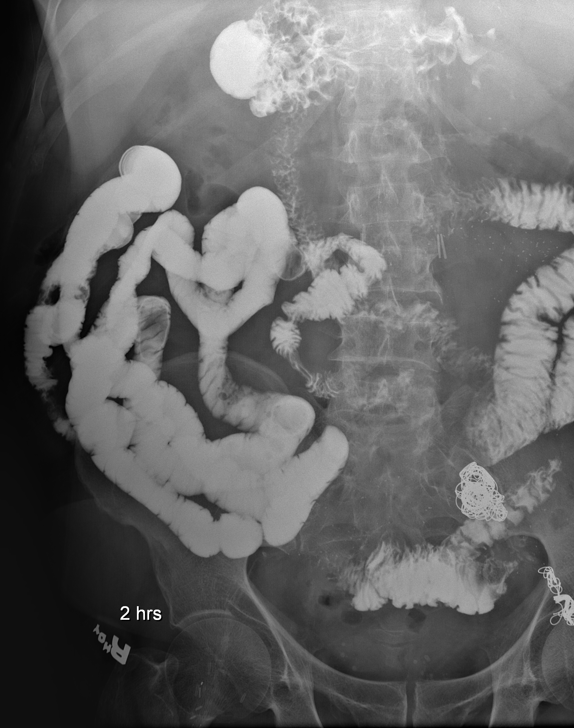

[Series 11: t abdomen barium ap · 0.14mm/px · 1 of 1 slices shown (7 of 7)]
[im 1/1]
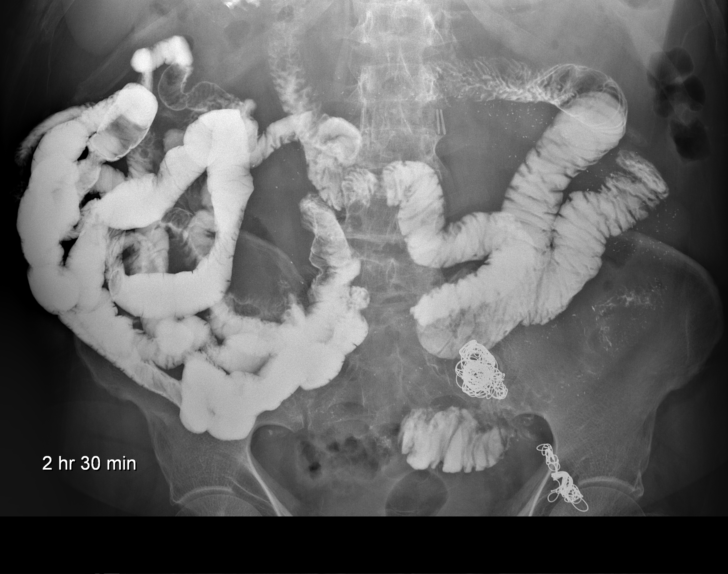

[Series 13: cp_standard · 0.26mm/px · 1 of 1 slices shown (1 of 2)]
[im 1/1]
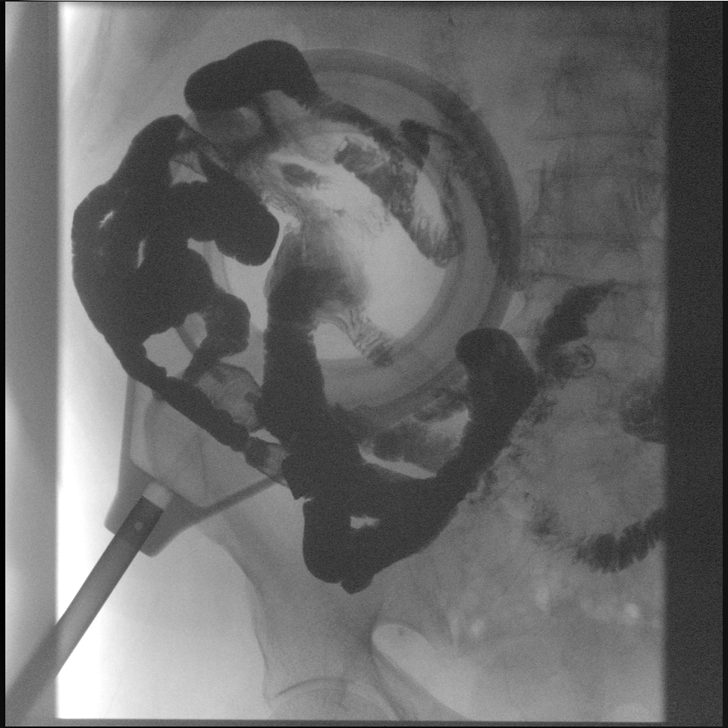

[Series 14: fluoro_barium singleshot_bw · 0.17mm/px · 1 of 1 slices shown (1 of 2)]
[im 1/1]
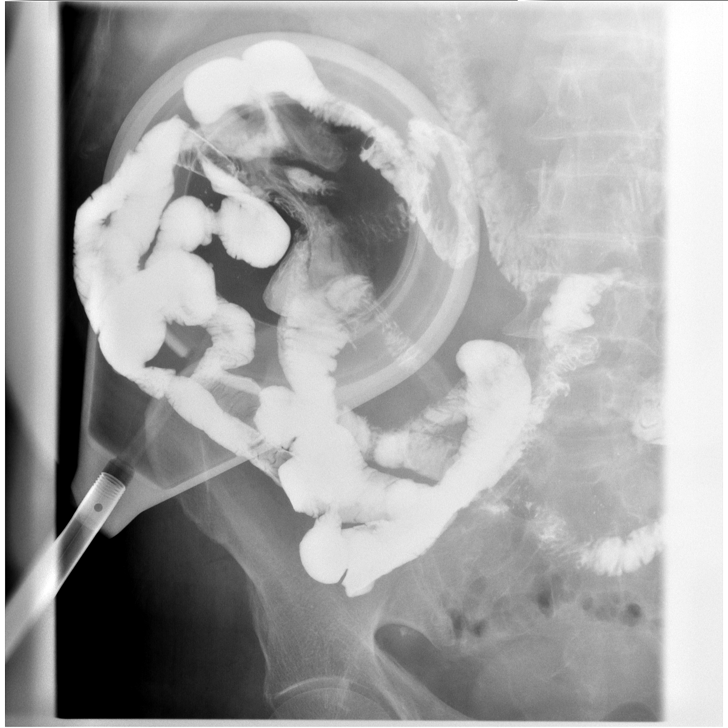

[Series 15: cp_standard · 0.26mm/px · 1 of 1 slices shown (2 of 2)]
[im 1/1]
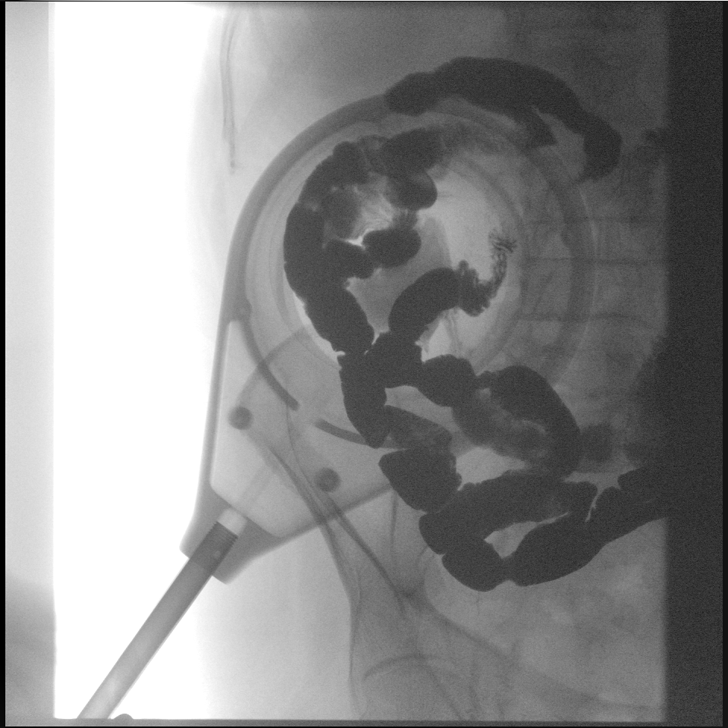

[Series 17: fluoro_barium singleshot_bw · 0.17mm/px · 1 of 1 slices shown (2 of 2)]
[im 1/1]
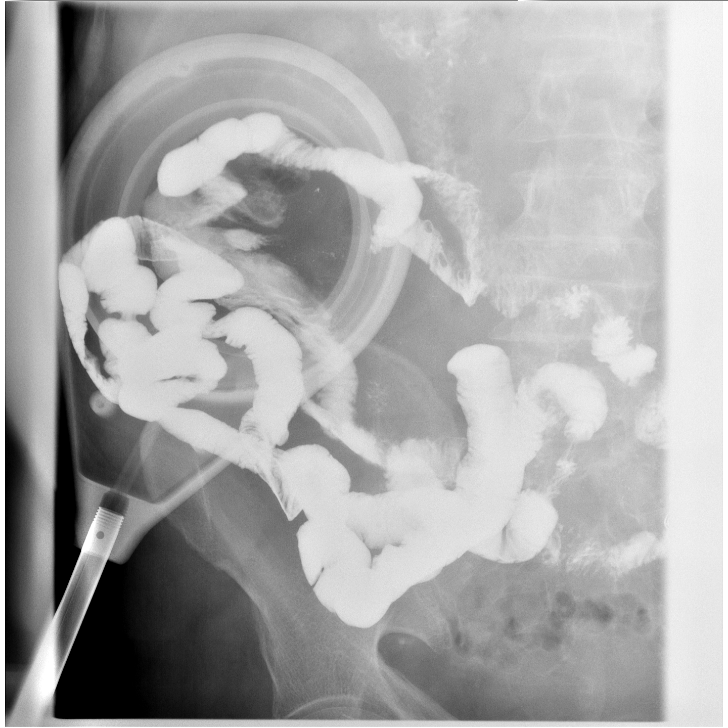

[12 of 17 positions shown; findings below may reference images not displayed]

FINDINGS: Initial plain film of the abdomen reveals normal stool and air
pattern. No abnormal calcifications are seen. Extensive
intravascular coils in the left internal iliac region.

Marked gastric fold thickening.

The small bowel demonstrates no structural abnormalities including
stricture, dilatation, adhesion, or mass. The terminal ileum is
identified and appears unremarkable.

Transit time to colon 180 minutes.
IMPRESSION: 1. Marked gastric fold thickening, compatible with hypertrophic
gastritis, as seen on recent CT.
2. Delayed transit time to colon of 3 hours.
3. No discrete mass within the small bowel or evidence of adhesion.

## 2019-05-04 ENCOUNTER — Encounter: Payer: Self-pay | Admitting: Family Medicine

## 2019-05-04 ENCOUNTER — Encounter: Payer: Self-pay | Admitting: Emergency Medicine

## 2019-05-04 ENCOUNTER — Other Ambulatory Visit: Payer: Self-pay

## 2019-05-04 ENCOUNTER — Emergency Department: Payer: Medicare Other

## 2019-05-04 ENCOUNTER — Emergency Department
Admission: EM | Admit: 2019-05-04 | Discharge: 2019-05-04 | Disposition: A | Discharge status: Home / Self Care | Payer: Medicare Other | Attending: Emergency Medicine | Admitting: Emergency Medicine

## 2019-05-04 DIAGNOSIS — Z85818 Personal history of malignant neoplasm of other sites of lip, oral cavity, and pharynx: Secondary | ICD-10-CM | POA: Insufficient documentation

## 2019-05-04 DIAGNOSIS — U071 COVID-19: Secondary | ICD-10-CM | POA: Diagnosis not present

## 2019-05-04 DIAGNOSIS — N179 Acute kidney failure, unspecified: Secondary | ICD-10-CM | POA: Insufficient documentation

## 2019-05-04 DIAGNOSIS — I2510 Atherosclerotic heart disease of native coronary artery without angina pectoris: Secondary | ICD-10-CM | POA: Insufficient documentation

## 2019-05-04 DIAGNOSIS — I130 Hypertensive heart and chronic kidney disease with heart failure and stage 1 through stage 4 chronic kidney disease, or unspecified chronic kidney disease: Secondary | ICD-10-CM | POA: Insufficient documentation

## 2019-05-04 DIAGNOSIS — Z85828 Personal history of other malignant neoplasm of skin: Secondary | ICD-10-CM | POA: Insufficient documentation

## 2019-05-04 DIAGNOSIS — E860 Dehydration: Secondary | ICD-10-CM | POA: Insufficient documentation

## 2019-05-04 DIAGNOSIS — Z794 Long term (current) use of insulin: Secondary | ICD-10-CM | POA: Diagnosis not present

## 2019-05-04 DIAGNOSIS — R42 Dizziness and giddiness: Secondary | ICD-10-CM | POA: Diagnosis present

## 2019-05-04 DIAGNOSIS — E1122 Type 2 diabetes mellitus with diabetic chronic kidney disease: Secondary | ICD-10-CM | POA: Insufficient documentation

## 2019-05-04 DIAGNOSIS — R0789 Other chest pain: Secondary | ICD-10-CM | POA: Diagnosis not present

## 2019-05-04 DIAGNOSIS — I5022 Chronic systolic (congestive) heart failure: Secondary | ICD-10-CM | POA: Diagnosis not present

## 2019-05-04 DIAGNOSIS — N184 Chronic kidney disease, stage 4 (severe): Secondary | ICD-10-CM | POA: Insufficient documentation

## 2019-05-04 DIAGNOSIS — Z79899 Other long term (current) drug therapy: Secondary | ICD-10-CM | POA: Insufficient documentation

## 2019-05-04 DIAGNOSIS — Z7901 Long term (current) use of anticoagulants: Secondary | ICD-10-CM | POA: Insufficient documentation

## 2019-05-04 DIAGNOSIS — R197 Diarrhea, unspecified: Secondary | ICD-10-CM | POA: Insufficient documentation

## 2019-05-04 DIAGNOSIS — Z951 Presence of aortocoronary bypass graft: Secondary | ICD-10-CM | POA: Insufficient documentation

## 2019-05-04 DIAGNOSIS — Z87891 Personal history of nicotine dependence: Secondary | ICD-10-CM | POA: Insufficient documentation

## 2019-05-04 LAB — CBC
HCT: 41 % (ref 39.0–52.0)
Hemoglobin: 12.9 g/dL — ABNORMAL LOW (ref 13.0–17.0)
MCH: 28.7 pg (ref 26.0–34.0)
MCHC: 31.5 g/dL (ref 30.0–36.0)
MCV: 91.3 fL (ref 80.0–100.0)
Platelets: 298 10*3/uL (ref 150–400)
RBC: 4.49 MIL/uL (ref 4.22–5.81)
RDW: 14.8 % (ref 11.5–15.5)
WBC: 6 10*3/uL (ref 4.0–10.5)
nRBC: 0 % (ref 0.0–0.2)

## 2019-05-04 LAB — BASIC METABOLIC PANEL
Anion gap: 10 (ref 5–15)
Anion gap: 9 (ref 5–15)
BUN: 51 mg/dL — ABNORMAL HIGH (ref 8–23)
BUN: 47 mg/dL — ABNORMAL HIGH (ref 8–23)
CO2: 18 mmol/L — ABNORMAL LOW (ref 22–32)
CO2: 19 mmol/L — ABNORMAL LOW (ref 22–32)
Calcium: 6.8 mg/dL — ABNORMAL LOW (ref 8.9–10.3)
Calcium: 6.5 mg/dL — ABNORMAL LOW (ref 8.9–10.3)
Chloride: 109 mmol/L (ref 98–111)
Chloride: 110 mmol/L (ref 98–111)
Creatinine, Ser: 2.85 mg/dL — ABNORMAL HIGH (ref 0.61–1.24)
Creatinine, Ser: 2.58 mg/dL — ABNORMAL HIGH (ref 0.61–1.24)
GFR calc Af Amer: 26 mL/min — ABNORMAL LOW (ref >60)
GFR calc Af Amer: 29 mL/min — ABNORMAL LOW (ref >60)
GFR calc non Af Amer: 22 mL/min — ABNORMAL LOW (ref >60)
GFR calc non Af Amer: 25 mL/min — ABNORMAL LOW (ref >60)
Glucose, Bld: 128 mg/dL — ABNORMAL HIGH (ref 70–99)
Glucose, Bld: 128 mg/dL — ABNORMAL HIGH (ref 70–99)
Potassium: 4.2 mmol/L (ref 3.5–5.1)
Potassium: 3.9 mmol/L (ref 3.5–5.1)
Sodium: 137 mmol/L (ref 135–145)
Sodium: 138 mmol/L (ref 135–145)

## 2019-05-04 LAB — SARS CORONAVIRUS 2 BY RT PCR (HOSPITAL ORDER, PERFORMED IN ~~LOC~~ HOSPITAL LAB): SARS Coronavirus 2: POSITIVE — AB

## 2019-05-04 LAB — TROPONIN I (HIGH SENSITIVITY)
Troponin I (High Sensitivity): 17 ng/L (ref <18)
Troponin I (High Sensitivity): 17 ng/L (ref <18)

## 2019-05-04 LAB — BRAIN NATRIURETIC PEPTIDE: B Natriuretic Peptide: 82 pg/mL (ref 0.0–100.0)

## 2019-05-04 IMAGING — CR DG CHEST 2V
1 series · 2 of 2 positions shown · non-contrast
Comparison: Radiographs [DATE].

CLINICAL DATA: Chest pain.

EXAM:
CHEST - 2 VIEW

[Series 1: dg chest 2 view · 0.14mm/px · 2 of 2 slices shown]
[im 1/2]
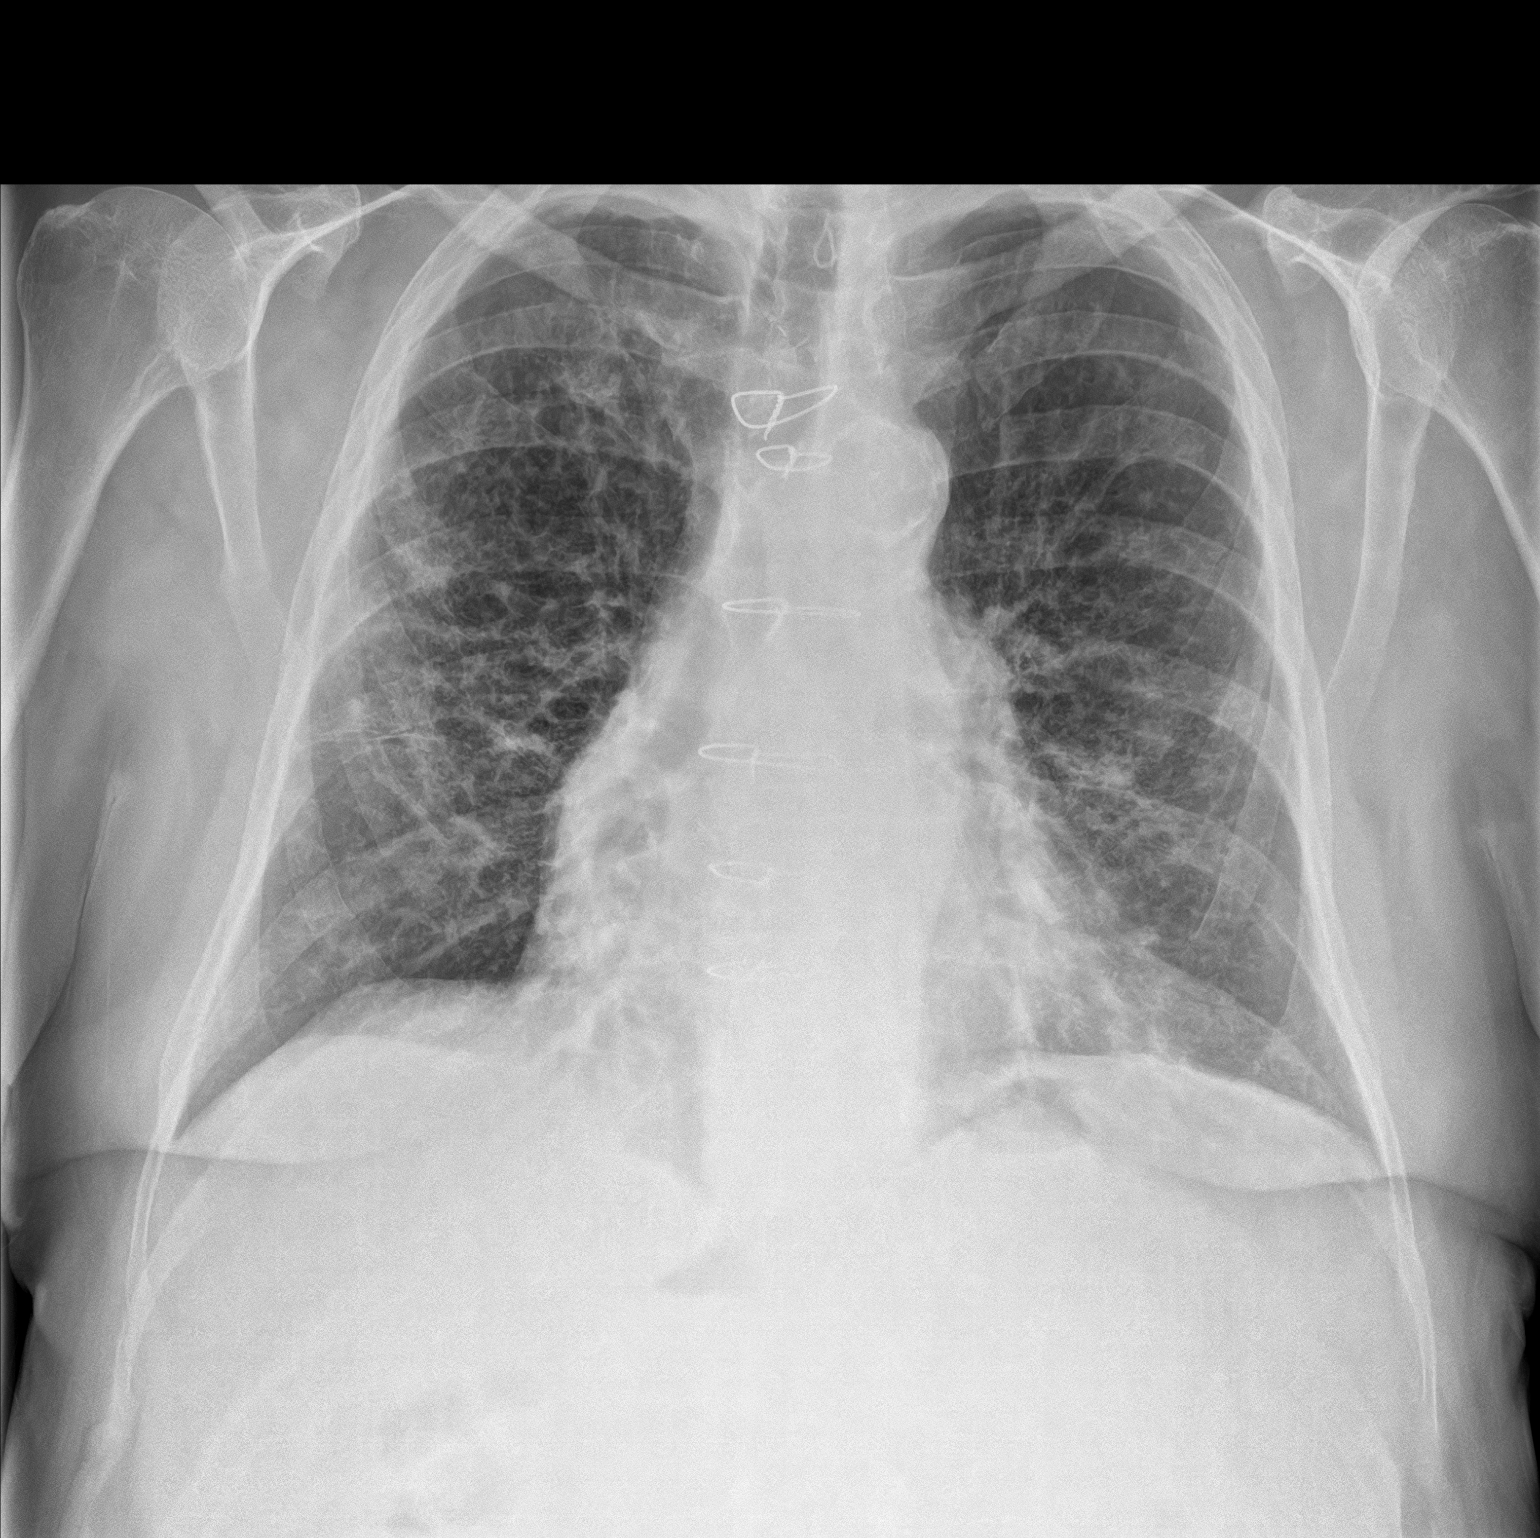
[im 2/2]
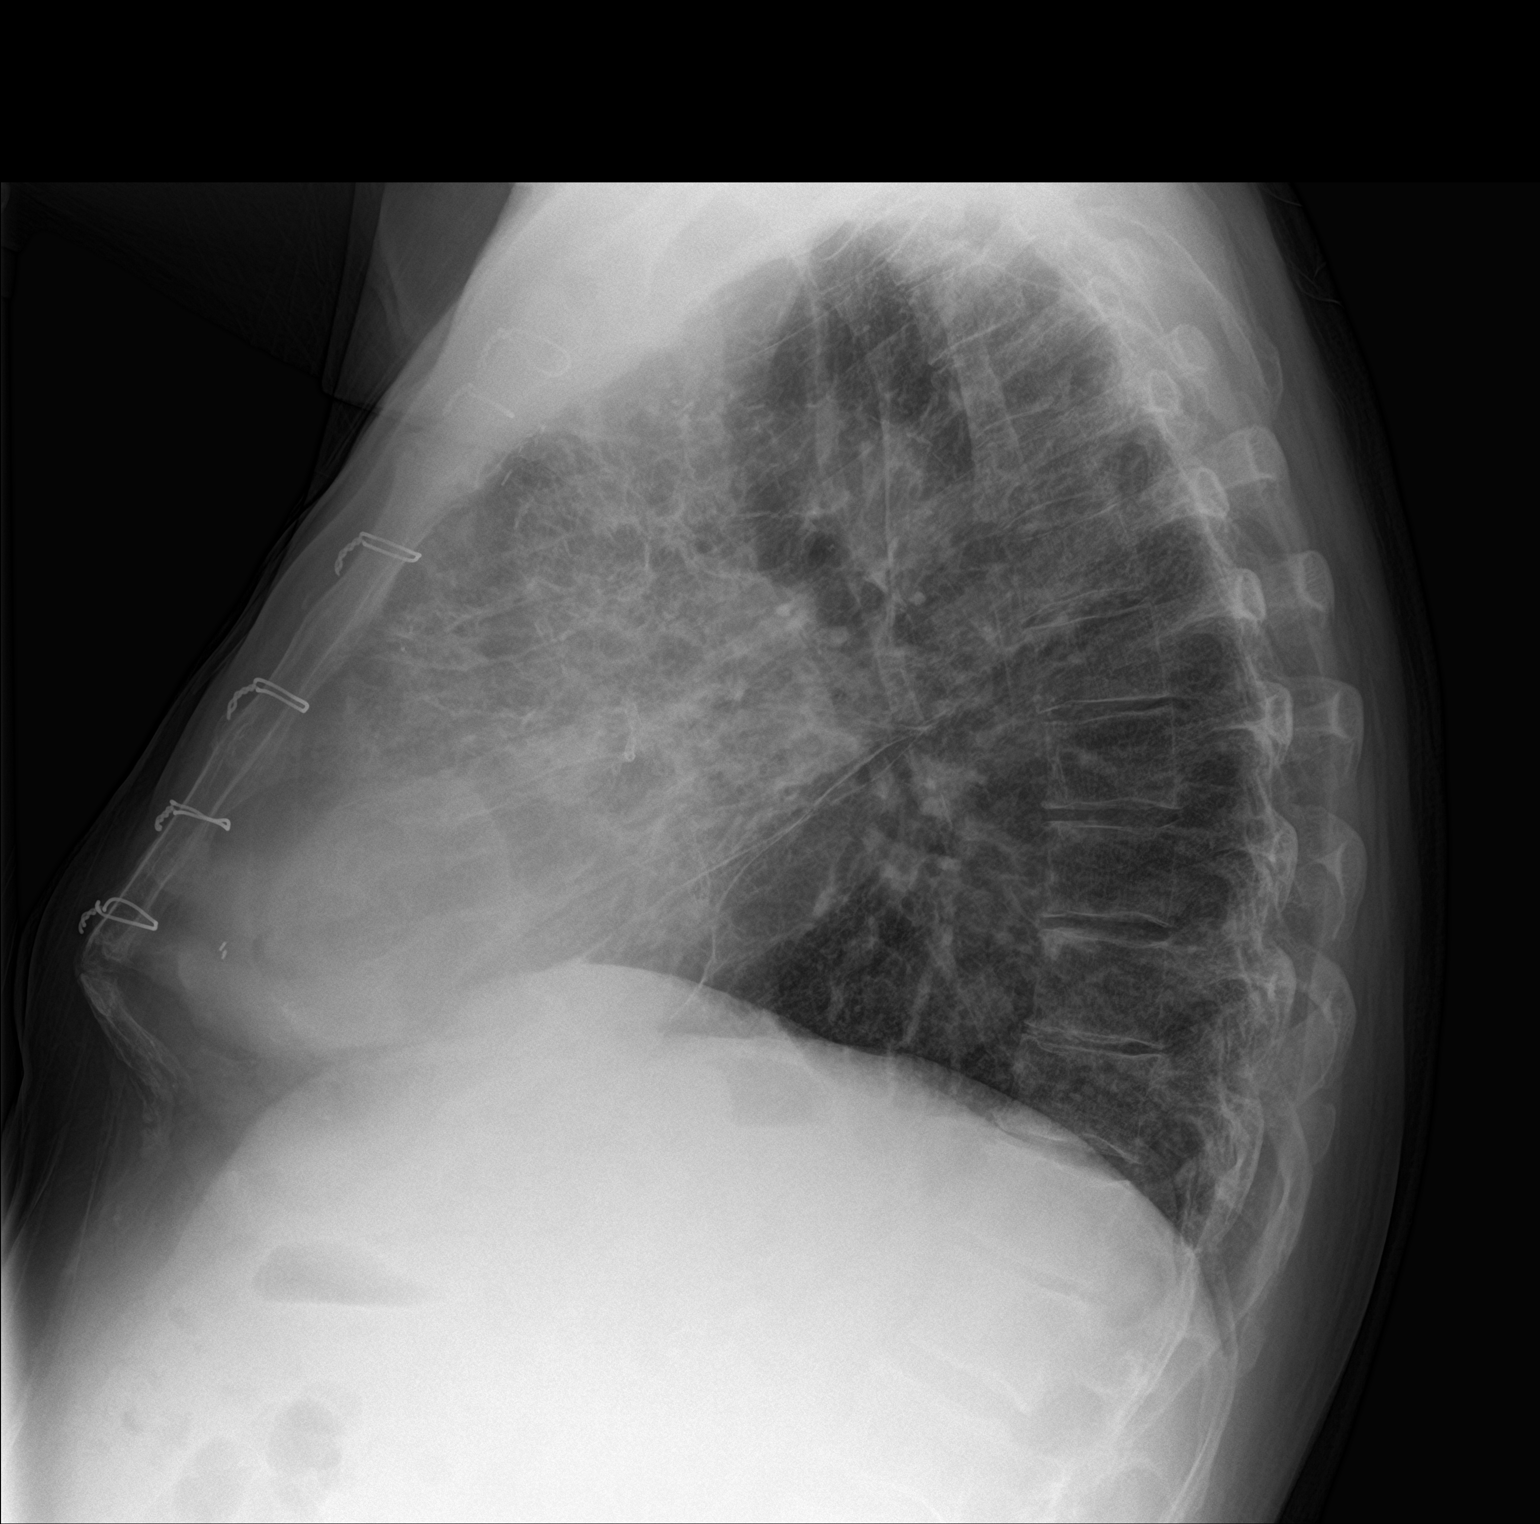

[2 of 2 positions shown; findings below may reference images not displayed]

FINDINGS: Stable cardiomediastinal silhouette. Atherosclerosis of thoracic
aorta is noted. No pneumothorax or pleural effusion is noted.
Reticular densities are noted throughout both lungs consistent with
chronic interstitial lung disease. Slightly increased densities are
noted in the right midlung laterally suggesting inflammation. Bony
thorax is unremarkable.
IMPRESSION: Reticular densities are noted throughout both lungs most consistent
with chronic interstitial lung disease. Slightly increased densities
are noted in the right midlung laterally suggesting superimposed
inflammation.

Aortic Atherosclerosis ([WF]-[WF]).

## 2019-05-04 MED ORDER — LACTATED RINGERS IV BOLUS
500.0000 mL | Freq: Once | INTRAVENOUS | Status: AC
  Administered 2019-05-04: 500 mL via INTRAVENOUS

## 2019-05-04 MED ORDER — LACTATED RINGERS IV BOLUS
1000.0000 mL | Freq: Once | INTRAVENOUS | Status: AC
  Administered 2019-05-04: 1000 mL via INTRAVENOUS

## 2019-05-04 NOTE — ED Notes (Signed)
Date and time results received: 05/04/19 1252  Test: COVID Critical Value: Positive  Name of Provider Notified: Dr. Charna Archer, MD

## 2019-05-04 NOTE — ED Triage Notes (Signed)
Pt reports diagnosed with positive COVID on 9/13 and was admitted to [facility].

## 2019-05-04 NOTE — ED Triage Notes (Signed)
Pt reports this am started with pressure like discomfort to his left chest. Pt denies SOB, pain that radiates, or nausea.

## 2019-05-04 NOTE — ED Provider Notes (Signed)
[facility] Emergency Department Provider Note   ____________________________________________   First MD Initiated Contact with Patient 05/04/19 [phone]     (approximate)  I have reviewed the triage vital signs and the nursing notes.   HISTORY  Chief Complaint Chest Pain    HPI Dylan Goodman is a 68 y.o. male with past medical history of CAD status post CABG, CHF, A. fib on Eliquis, diabetes, and CKD who presents to the ED complaining of chest pain and lightheadedness.  Patient was recently admitted to [facility] for COVID-19 on September 13, states that since he has been discharged home he has been feeling very lightheaded most of the time.  He has not had any fevers, cough, or shortness of breath.  He did wake up this morning with some pressure in the center of his chest, which she states has since resolved.  He denies any nausea or vomiting, does report dealing with chronic diarrhea for which she follows with GI.        Past Medical History:  Diagnosis Date  . Bleeding gastrointestinal 09/03/2018  . 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 Chronic depression 09/03/2018  . CKD (chronic kidney disease) 09/03/2018  . Coronary atherosclerosis 09/03/2018  . Coronary bypass graft mechanical complication 19/4174  . Diabetes mellitus without complication ([place])   . Gastric ulcer with hemorrhage 09/03/2018  . Gastritis 09/03/2018  . GERD (gastroesophageal reflux disease) 09/03/2018  . Heart disease   . Heart murmur   . IDA (iron deficiency anemia) 09/03/2018  . Low magnesium levels 09/03/2018  . 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 Myocardial infarction ([place]) 2017  . Personal history of radiation therapy 05/30/2018   39 treatments with chemotherapy nasopharyngeal cancer  . 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 Acute renal failure  superimposed on stage 4 chronic kidney disease ([place]) 04/13/2019  . Hypocalcemia 04/13/2019  . Hyponatremia 04/13/2019  . Metabolic acidosis 03/12/4817  . Unspecified atrial fibrillation ([place]) 04/13/2019  . COVID-19 virus infection 04/13/2019  . Acute respiratory failure with hypoxia ([place]) 04/13/2019  . Dizziness 03/12/2019  . Atherosclerosis of aorta ([place]) 01/29/2019  . Type 2 diabetes mellitus with stage 4 chronic kidney disease, with long-term current use of insulin ([place]) 09/13/2018  . Hyperlipidemia, mixed 09/13/2018  . Senile purpura ([place]) 09/08/2018  . Morbid obesity ([place]) 09/08/2018  . Chronic diarrhea of unknown origin 09/04/2018  . Menetrier's disease (hyperplastic hypersecretory gastropathy) 09/04/2018  . Chronic kidney disease 09/03/2018  . History of gastric ulcer 09/03/2018  . Gastritis 09/03/2018  . GERD (gastroesophageal reflux disease) 09/03/2018  . IDA (iron deficiency anemia) 09/03/2018  . Hypomagnesemia 09/03/2018  . History of cancer chemotherapy 09/03/2018  . Chronic systolic CHF (congestive heart failure), NYHA class 2 ([place]) 09/02/2018  . 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 Benign essential HTN 07/21/2018  . Bilateral carotid artery stenosis 07/21/2018  . 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 CAROTID ENDARTERECTOMY  2006  . COLONOSCOPY WITH PROPOFOL N/A 03/11/2019   Procedure: COLONOSCOPY WITH PROPOFOL;  Surgeon: Toledo, Benay Pike, MD;  Location: [facility] ENDOSCOPY;  Service: Gastroenterology;  Laterality: N/A;  . CORONARY ARTERY BYPASS GRAFT  1996   quadruple bypass  . ESOPHAGOGASTRODUODENOSCOPY (EGD) WITH PROPOFOL N/A 03/11/2019  Procedure: ESOPHAGOGASTRODUODENOSCOPY (EGD) WITH PROPOFOL;  Surgeon: Toledo, Benay Pike, MD;  Location: [facility] ENDOSCOPY;  Service: Gastroenterology;  Laterality: N/A;  . heart  stent  10/2010   x3  . 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 left leg stents  08/2009  . LOWER EXTREMITY ANGIOGRAPHY Left 11/11/2018   Procedure: LOWER EXTREMITY ANGIOGRAPHY;  Surgeon: Katha Cabal, MD;  Location: [facility] [place] [facility];  Service: Cardiovascular;  Laterality: Left;  . REVISION OF AORTA BIFEMORAL BYPASS    . right femo pop bypass  06/1998   11/2001, 08/2003  . right leg stent  07/2009    Prior to Admission medications   Medication Sig Start Date End Date Taking? Authorizing Provider  apixaban (ELIQUIS) 5 MG TABS tablet Take 1 tablet (5 mg total) by mouth 2 (two) times daily. 04/18/19   Patrecia Pour, MD  budesonide-formoterol (SYMBICORT) 160-4.5 MCG/ACT inhaler Inhale 2 puffs into the lungs 2 (two) times daily. Rinse mouth after use 03/17/19   Laverle Hobby, MD  Calcium Carbonate-Vitamin D3 (CALCIUM 600-D) 600-400 MG-UNIT TABS Take 1 tablet by mouth daily.    [provider]  Cholecalciferol (VITAMIN D3) 25 MCG (1000 UT) CAPS Take by mouth.    [provider]  cilostazol (PLETAL) 100 MG tablet Take 100 mg by mouth 2 (two) times daily.     [provider]  colchicine 0.6 MG tablet Take 0.6 mg by mouth once.    [provider]  diphenoxylate-atropine (LOMOTIL) 2.5-0.025 MG tablet Take 1 tablet by mouth 4 (four) times daily as needed for diarrhea or loose stools.    [provider]  ezetimibe (ZETIA) 10 MG tablet Take 10 mg by mouth daily.  07/07/18   [provider]  fenofibrate 160 MG tablet Take 160 mg by mouth daily.     [provider]  Insulin Degludec (TRESIBA FLEXTOUCH) 200 UNIT/ML SOPN Inject 12 Units into the skin at bedtime.  05/17/18   [provider]  isosorbide mononitrate (IMDUR) 120 MG 24 hr tablet Take 120 mg by mouth daily.    [provider]  lansoprazole (PREVACID) 30 MG capsule Take 30 mg by mouth every morning.     [provider]  losartan (COZAAR) 25  MG tablet Take 25 mg by mouth daily.  08/29/18   [provider]  lovastatin (MEVACOR) 40 MG tablet Take 40 mg by mouth 2 (two) times daily.  07/01/18   [provider]  meclizine (ANTIVERT) 12.5 MG tablet Take 1 tablet (12.5 mg total) by mouth 3 (three) times daily as needed for dizziness. 04/05/19   Nena Polio, MD  metoprolol tartrate (LOPRESSOR) 100 MG tablet Take 1 tablet (100 mg total) by mouth 2 (two) times daily. 04/18/19   Patrecia Pour, MD  nitroGLYCERIN (NITROSTAT) 0.4 MG SL tablet Place 0.4 mg under the tongue every 5 (five) minutes as needed for chest pain.  07/01/18   [provider]  sodium bicarbonate 650 MG tablet Take 1 tablet by mouth daily.  12/31/18   Kolluru, Lurena Nida, MD  ticagrelor (BRILINTA) 60 MG TABS tablet Take 60 mg by mouth 2 (two) times daily.     [provider]    Allergies Patient has no known allergies.  Family History  Problem Relation Age of Onset  . Coronary artery disease Mother   . Alcohol abuse Father   . Colon cancer Father   . Coronary artery disease Father   . Kidney  cancer Father   . Breast cancer Sister     Social History Social History   Tobacco Use  . Smoking status: Former Smoker    Packs/day: 1.50    Years: 50.00    Pack years: 75.00    Types: Cigarettes    Quit date: 02/18/2017    Years since quitting: 2.2  . Smokeless tobacco: Never Used  Substance Use Topics  . Alcohol use: Not Currently  . Drug use: Never    Review of Systems  Constitutional: No fever/chills Eyes: No visual changes. ENT: No sore throat. Cardiovascular: Positive for chest pain. Respiratory: Denies shortness of breath. Gastrointestinal: No abdominal pain.  No nausea, no vomiting.  No diarrhea.  No constipation. Genitourinary: Negative for dysuria. Musculoskeletal: Negative for back pain. Skin: Negative for rash. Neurological: Negative for headaches, focal weakness or numbness.  Positive for lightheadedness.   ____________________________________________   PHYSICAL EXAM:  VITAL SIGNS: ED Triage Vitals  Enc Vitals Group     BP 05/04/19 0908 114/87     Pulse Rate 05/04/19 0908 (!) 104     Resp 05/04/19 0908 20     Temp 05/04/19 0908 (!) 97.4 F (36.3 C)     Temp Source 05/04/19 0908 Oral     SpO2 05/04/19 0908 94 %     Weight 05/04/19 0906 212 lb (96.2 kg)     Height 05/04/19 0906 6' (1.829 m)     Head Circumference --      Peak Flow --      Pain Score 05/04/19 0906 1     Pain Loc --      Pain Edu? --      Excl. in [place]? --     Constitutional: Alert and oriented. Eyes: Conjunctivae are normal. Head: Atraumatic. Nose: No congestion/rhinnorhea. Mouth/Throat: Mucous membranes are moist. Neck: Normal ROM Cardiovascular: Normal rate, regular rhythm. Grossly normal heart sounds. Respiratory: Normal respiratory effort.  No retractions. Lungs CTAB. Gastrointestinal: Soft and nontender. No distention. Genitourinary: deferred Musculoskeletal: No lower extremity tenderness nor edema. Neurologic:  Normal speech and language. No gross focal neurologic deficits are appreciated. Skin:  Skin is warm, dry and intact. No rash noted. Psychiatric: Mood and affect are normal. Speech and behavior are normal.  ____________________________________________   LABS (all labs ordered are listed, but only abnormal results are displayed)  Labs Reviewed  SARS CORONAVIRUS 2 ([facility]) - Abnormal; Notable for the following components:      Result Value   SARS Coronavirus 2 POSITIVE (*)    All other components within normal limits  BASIC METABOLIC PANEL - Abnormal; Notable for the following components:   CO2 18 (*)    Glucose, Bld 128 (*)    BUN 51 (*)    Creatinine, Ser 2.85 (*)    Calcium 6.8 (*)    GFR calc non Af Amer 22 (*)    GFR calc Af Amer 26 (*)    All other components within normal limits  CBC - Abnormal; Notable for the following components:    Hemoglobin 12.9 (*)    All other components within normal limits  BASIC METABOLIC PANEL - Abnormal; Notable for the following components:   CO2 19 (*)    Glucose, Bld 128 (*)    BUN 47 (*)    Creatinine, Ser 2.58 (*)    Calcium 6.5 (*)    GFR calc non Af Amer 25 (*)    GFR calc Af Amer 29 (*)  All other components within normal limits  BRAIN NATRIURETIC PEPTIDE  TROPONIN I (HIGH SENSITIVITY)  TROPONIN I (HIGH SENSITIVITY)   ____________________________________________  EKG  ED ECG REPORT I, Blake Divine, the attending physician, personally viewed and interpreted this ECG.   Date: 05/04/2019  EKG Time: 9:08  Rate: 111  Rhythm: atrial fibrillation, rate 111  Axis: RAD  Intervals:none  ST&T Change: None    PROCEDURES  Procedure(s) performed (including Critical Care):  Procedures    ____________________________________________   INITIAL IMPRESSION / ASSESSMENT AND PLAN / ED COURSE       68 year old male with history of CAD status post CABG as well as chronic A. fib presents to the ED complaining of ongoing lightheadedness following recent diagnosis of COVID-19 as well as episode of chest pressure this morning.  EKG shows A. fib with slightly increased rate, but no acute ischemic changes.  Low suspicion for ACS given fleeting pressure described by patient, he also had no evidence of perfusion defect on nuclear stress test in January.  Will screen 2 sets of troponin, check chest x-ray.  Patient with prior AKI related to COVID-19, additionally has had ongoing diarrhea chronically, at risk for dehydration.  Will hydrate with IV fluids, check renal function and electrolytes.  Work-up unremarkable outside of mild elevation in creatinine.  Suspect this is secondary to dehydration given patient's chronic diarrhea.  Doubt ACS as he has 2 sets of troponin that are negative.  While his COVID-19 testing is positive here, do not suspect this represents new infection given his prior  positive test within the last month.  Chest x-ray negative for acute process, shows chronic interstitial changes.  Patient was hydrated with IV fluids with improvement in creatinine.  Counseled patient to maintain p.o. hydration at home and follow-up with his nephrologist for recheck of kidney function.  Counseled to return to the ED for new or worsening symptoms, patient agrees with plan.      ____________________________________________   FINAL CLINICAL IMPRESSION(S) / ED DIAGNOSES  Final diagnoses:  AKI (acute kidney injury) ([place])  Dehydration  Atypical chest pain     ED Discharge Orders    None       Note:  This document was prepared using Dragon voice recognition software and may include unintentional dictation errors.   Blake Divine, MD 05/04/19 [phone]

## 2019-05-04 NOTE — ED Notes (Signed)
[facility]  CALLED  PER  DR  JESSUP MD

## 2019-05-05 ENCOUNTER — Emergency Department: Payer: Medicare Other

## 2019-05-05 ENCOUNTER — Other Ambulatory Visit: Payer: Self-pay

## 2019-05-05 ENCOUNTER — Encounter: Payer: Federal, State, Local not specified - PPO | Admitting: Physical Therapy

## 2019-05-05 ENCOUNTER — Encounter: Payer: Self-pay | Admitting: Emergency Medicine

## 2019-05-05 ENCOUNTER — Emergency Department
Admission: EM | Admit: 2019-05-05 | Discharge: 2019-05-06 | Disposition: A | Discharge status: Other Acute Inpatient Hospital | Payer: Medicare Other | Attending: Emergency Medicine | Admitting: Emergency Medicine

## 2019-05-05 DIAGNOSIS — R0902 Hypoxemia: Secondary | ICD-10-CM | POA: Diagnosis not present

## 2019-05-05 DIAGNOSIS — Z87891 Personal history of nicotine dependence: Secondary | ICD-10-CM | POA: Diagnosis not present

## 2019-05-05 DIAGNOSIS — R569 Unspecified convulsions: Secondary | ICD-10-CM

## 2019-05-05 DIAGNOSIS — Z7901 Long term (current) use of anticoagulants: Secondary | ICD-10-CM | POA: Insufficient documentation

## 2019-05-05 DIAGNOSIS — R531 Weakness: Secondary | ICD-10-CM | POA: Diagnosis present

## 2019-05-05 DIAGNOSIS — U071 COVID-19: Secondary | ICD-10-CM | POA: Diagnosis not present

## 2019-05-05 DIAGNOSIS — I4901 Ventricular fibrillation: Secondary | ICD-10-CM | POA: Diagnosis not present

## 2019-05-05 LAB — LACTATE DEHYDROGENASE: LDH: 165 U/L (ref 98–192)

## 2019-05-05 LAB — COMPREHENSIVE METABOLIC PANEL
ALT: 22 U/L (ref 0–44)
AST: 44 U/L — ABNORMAL HIGH (ref 15–41)
Albumin: 3.1 g/dL — ABNORMAL LOW (ref 3.5–5.0)
Alkaline Phosphatase: 62 U/L (ref 38–126)
Anion gap: 22 — ABNORMAL HIGH (ref 5–15)
BUN: 44 mg/dL — ABNORMAL HIGH (ref 8–23)
CO2: 11 mmol/L — ABNORMAL LOW (ref 22–32)
Calcium: 7.1 mg/dL — ABNORMAL LOW (ref 8.9–10.3)
Chloride: 105 mmol/L (ref 98–111)
Creatinine, Ser: 3.06 mg/dL — ABNORMAL HIGH (ref 0.61–1.24)
GFR calc Af Amer: 24 mL/min — ABNORMAL LOW (ref >60)
GFR calc non Af Amer: 20 mL/min — ABNORMAL LOW (ref >60)
Glucose, Bld: 198 mg/dL — ABNORMAL HIGH (ref 70–99)
Potassium: 3.8 mmol/L (ref 3.5–5.1)
Sodium: 138 mmol/L (ref 135–145)
Total Bilirubin: 0.7 mg/dL (ref 0.3–1.2)
Total Protein: 6.8 g/dL (ref 6.5–8.1)

## 2019-05-05 LAB — GI PATHOGEN PANEL BY PCR, STOOL

## 2019-05-05 LAB — PROTIME-INR
INR: 1.4 — ABNORMAL HIGH (ref 0.8–1.2)
Prothrombin Time: 17.3 seconds — ABNORMAL HIGH (ref 11.4–15.2)

## 2019-05-05 LAB — FIBRIN DERIVATIVES D-DIMER (ARMC ONLY): Fibrin derivatives D-dimer (ARMC): >10000 ng/mL (FEU) — ABNORMAL HIGH (ref 0.00–499.00)

## 2019-05-05 LAB — CBC
HCT: 41.1 % (ref 39.0–52.0)
Hemoglobin: 12.5 g/dL — ABNORMAL LOW (ref 13.0–17.0)
MCH: 28.5 pg (ref 26.0–34.0)
MCHC: 30.4 g/dL (ref 30.0–36.0)
MCV: 93.8 fL (ref 80.0–100.0)
Platelets: 353 10*3/uL (ref 150–400)
RBC: 4.38 MIL/uL (ref 4.22–5.81)
RDW: 15 % (ref 11.5–15.5)
WBC: 8.2 10*3/uL (ref 4.0–10.5)
nRBC: 0 % (ref 0.0–0.2)

## 2019-05-05 LAB — FERRITIN: Ferritin: 481 ng/mL — ABNORMAL HIGH (ref 24–336)

## 2019-05-05 LAB — PROCALCITONIN: Procalcitonin: 0.14 ng/mL

## 2019-05-05 LAB — APTT: aPTT: 31 seconds (ref 24–36)

## 2019-05-05 LAB — FECAL FAT, QUALITATIVE
Fat Qual Neutral, Stl: NORMAL
Fat Qual Total, Stl: NORMAL

## 2019-05-05 LAB — TROPONIN I (HIGH SENSITIVITY)
Troponin I (High Sensitivity): 29 ng/L — ABNORMAL HIGH (ref <18)
Troponin I (High Sensitivity): 167 ng/L (ref <18)

## 2019-05-05 LAB — LACTIC ACID, PLASMA: Lactic Acid, Venous: 1.2 mmol/L (ref 0.5–1.9)

## 2019-05-05 LAB — FIBRINOGEN: Fibrinogen: 437 mg/dL (ref 210–475)

## 2019-05-05 IMAGING — DX DG CHEST 1V PORT
1 series · 1 of 1 positions shown · non-contrast
Comparison: [DATE]

CLINICAL DATA: Shortness of breath, dizziness, and weakness.
[US] virus infection.

EXAM:
PORTABLE CHEST 1 VIEW

[chest ap]
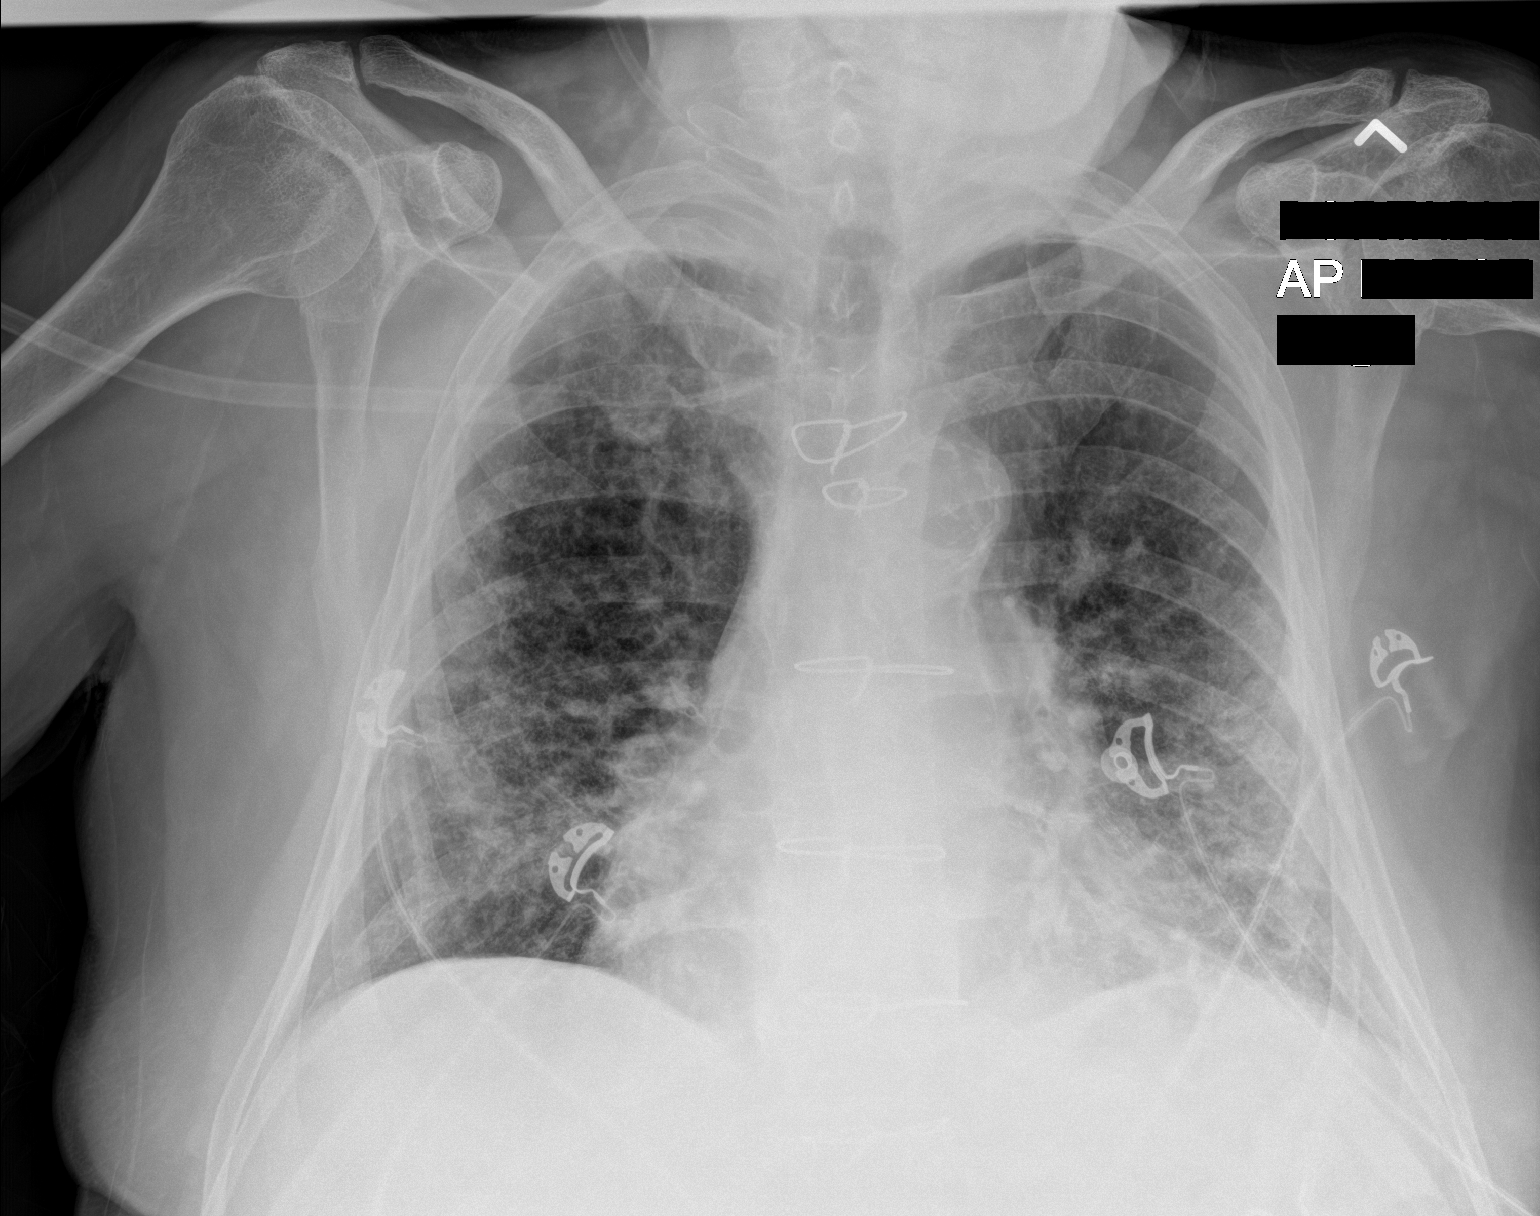

[1 of 1 positions shown; findings below may reference images not displayed]

FINDINGS: Stable mild cardiomegaly. Prior median sternotomy. Underlying
chronic interstitial lung disease is again noted. Increased opacity
is seen in both lower lobes, suspicious for superimposed pneumonia.
No evidence of pleural effusion.
IMPRESSION: Increased opacity in both lower lobes, suspicious for pneumonia
superimposed on underlying chronic interstitial lung disease.

Stable mild cardiomegaly.

## 2019-05-05 IMAGING — CT CT HEAD W/O CM
3 series · 15 of 47 positions shown, 18 images · non-contrast
Comparison: [DATE]

CLINICAL DATA: Seizure, covid positive patient

EXAM:
CT HEAD WITHOUT CONTRAST
TECHNIQUE: Contiguous axial images were obtained from the base of the skull
through the vertex without intravenous contrast.

[Series 2: head wo · axial · 0.45mm/px · z∈[-240,-115]mm · 9 of 31 slices shown, 12 images]
[im 3/31  brain]
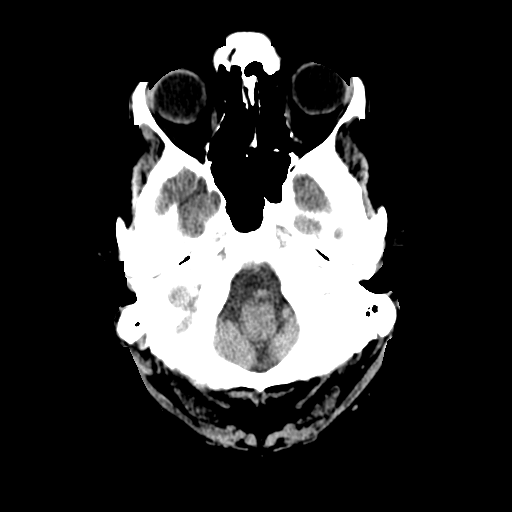
[im 3/31  bone]
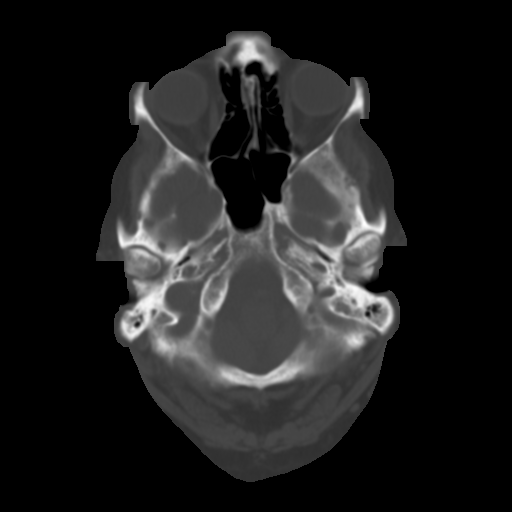
[im 6/31  brain]
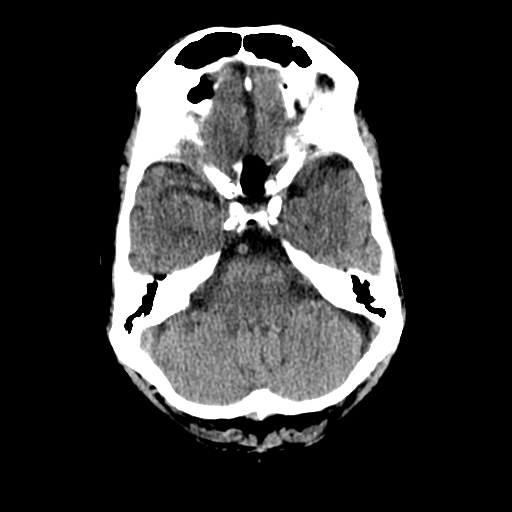
[im 9/31  brain]
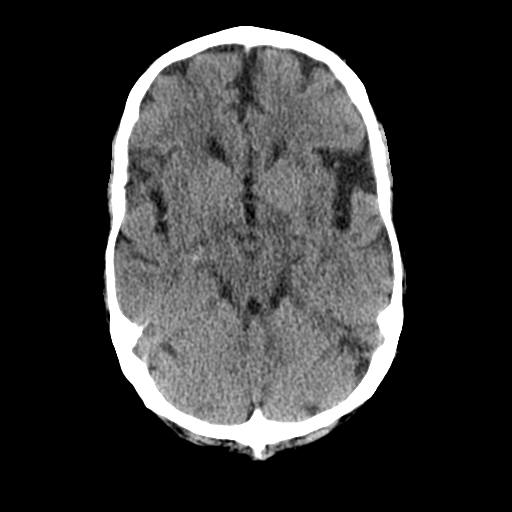
[im 12/31  brain]
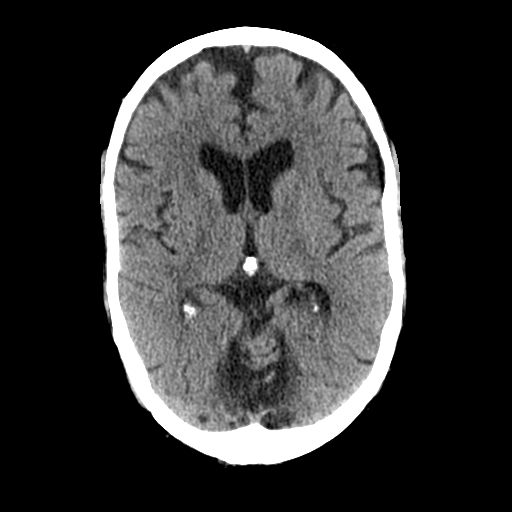
[im 16/31  brain]
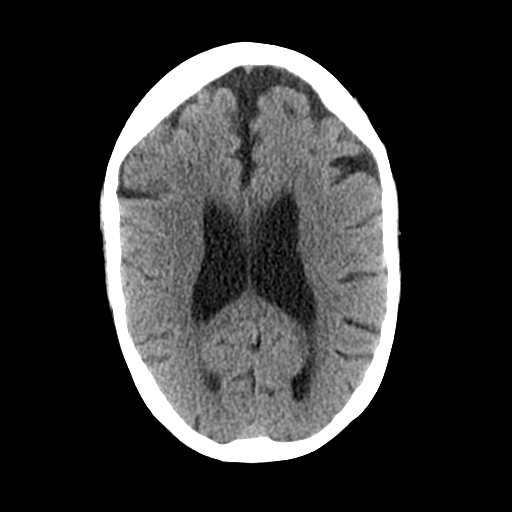
[im 16/31  bone]
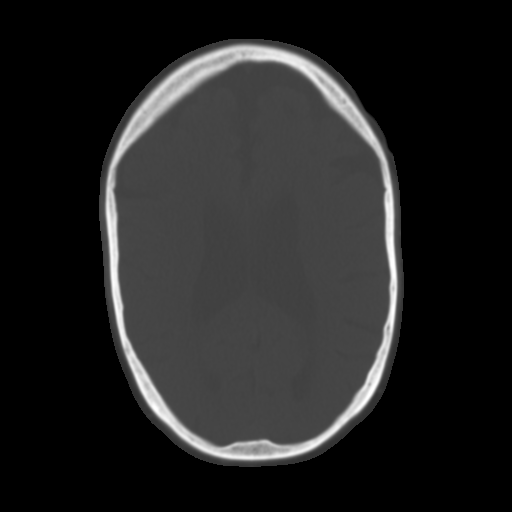
[im 19/31  brain]
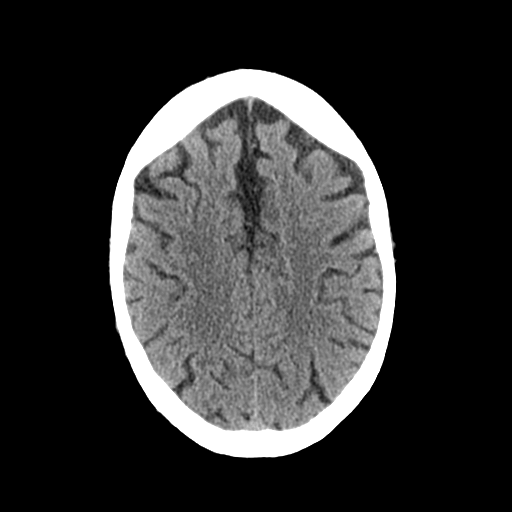
[im 22/31  brain]
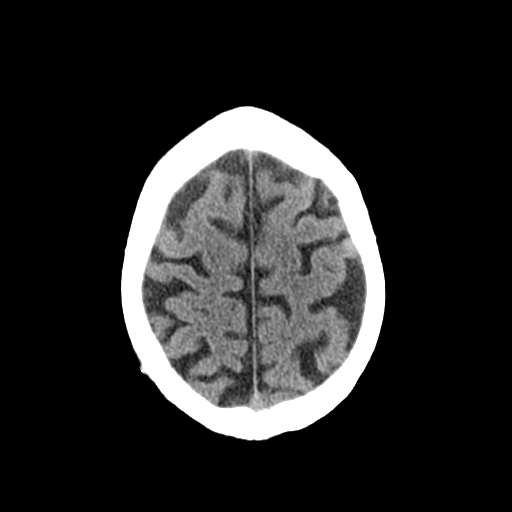
[im 25/31  brain]
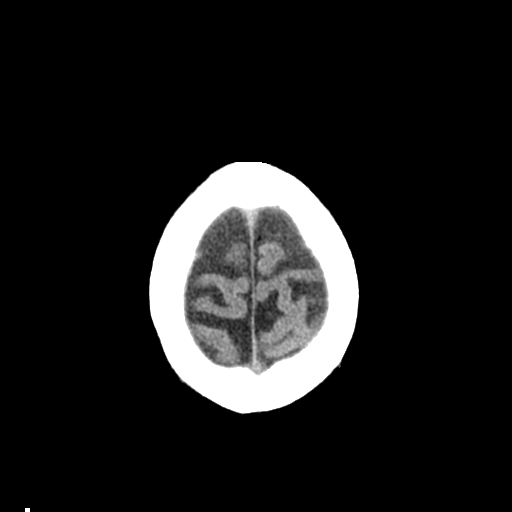
[im 28/31  brain]
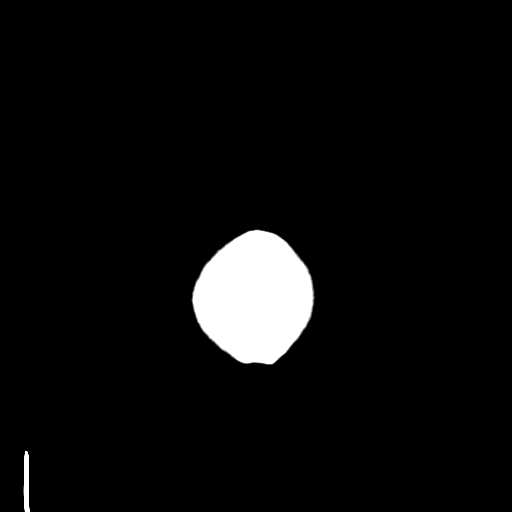
[im 28/31  bone]
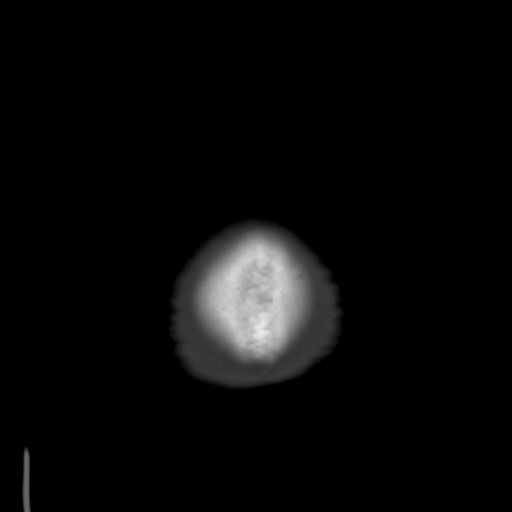

[Series 4: coronal soft tissue · coronal · 0.31mm/px · 3 of 68 slices shown]
[im 23/68  brain]
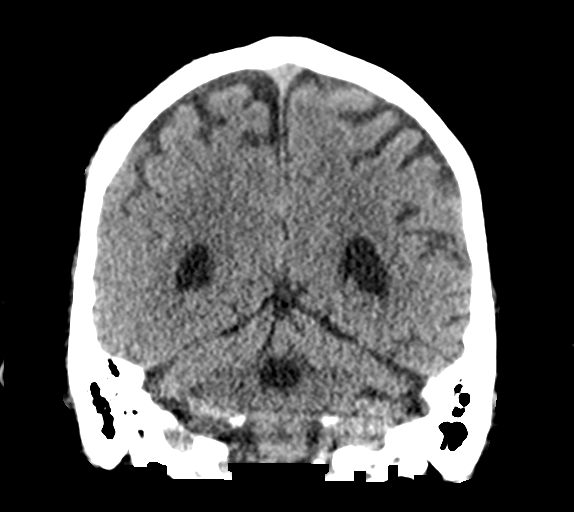
[im 30/68  brain]
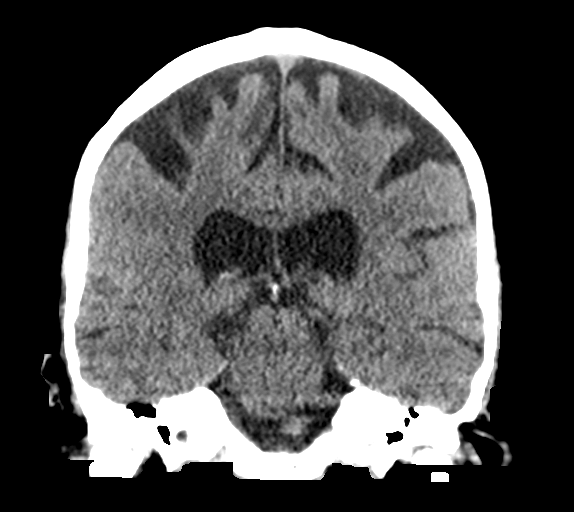
[im 38/68  brain]
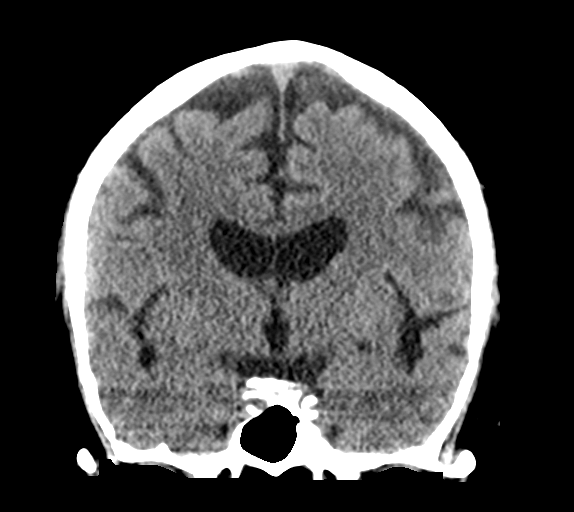

[Series 5: sagittal soft tissue · sagittal · 0.31mm/px · 3 of 51 slices shown]
[im 17/51  brain]
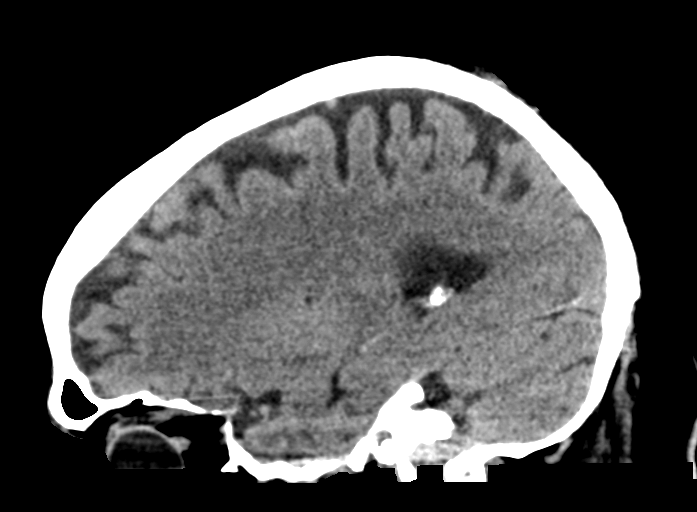
[im 26/51  brain]
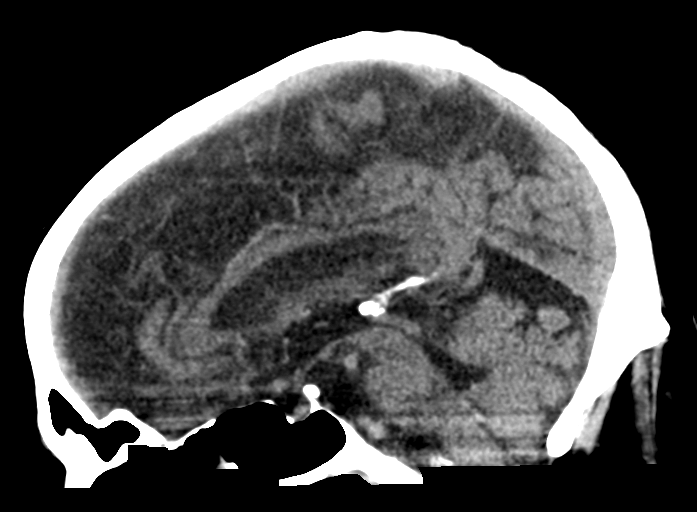
[im 34/51  brain]
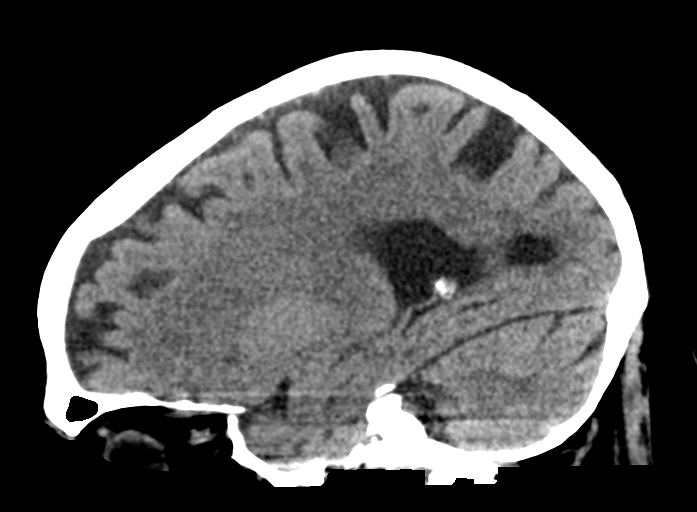

[15 of 47 positions shown; findings below may reference images not displayed]

FINDINGS: Brain: No evidence of acute territorial infarction, hemorrhage,
hydrocephalus,extra-axial collection or mass lesion/mass effect.
Normal gray-white differentiation. There is mild dilatation the
ventricles and sulci consistent with age-related atrophy.

Vascular: No hyperdense vessel. Calcifications are seen within the
internal carotid artery and bilateral vertebral arteries.

Skull: The skull is intact. No fracture or focal lesion identified.

Sinuses/Orbits: The visualized paranasal sinuses and mastoid air
cells are clear. The orbits and globes intact.

Other: None
IMPRESSION: No acute intracranial abnormality.

Findings consistent with age related atrophy

## 2019-05-05 MED ORDER — DEXAMETHASONE SODIUM PHOSPHATE 10 MG/ML IJ SOLN
10.0000 mg | Freq: Once | INTRAMUSCULAR | Status: AC
  Administered 2019-05-05: 22:00:00 10 mg via INTRAVENOUS
  Filled 2019-05-05: qty 1

## 2019-05-05 MED ORDER — HEPARIN (PORCINE) 25000 UT/250ML-% IV SOLN
2050.0000 [IU]/h | INTRAVENOUS | Status: DC
  Administered 2019-05-05: 22:00:00 1700 [IU]/h via INTRAVENOUS
  Administered 2019-05-06: 1900 [IU]/h via INTRAVENOUS
  Filled 2019-05-05 (×2): qty 250

## 2019-05-05 MED ORDER — SODIUM CHLORIDE 0.9 % IV SOLN
2.0000 g | Freq: Once | INTRAVENOUS | Status: AC
  Administered 2019-05-05: 2 g via INTRAVENOUS
  Filled 2019-05-05: qty 2

## 2019-05-05 MED ORDER — VANCOMYCIN HCL 10 G IV SOLR
2000.0000 mg | Freq: Once | INTRAVENOUS | Status: AC
  Administered 2019-05-05: 22:00:00 2000 mg via INTRAVENOUS
  Filled 2019-05-05: qty 2000

## 2019-05-05 MED ORDER — SODIUM CHLORIDE 0.9 % IV BOLUS
1000.0000 mL | Freq: Once | INTRAVENOUS | Status: AC
  Administered 2019-05-05: 22:00:00 1000 mL via INTRAVENOUS

## 2019-05-05 MED ORDER — SODIUM CHLORIDE 0.9 % IV BOLUS
1000.0000 mL | Freq: Once | INTRAVENOUS | Status: AC
  Administered 2019-05-05: 1000 mL via INTRAVENOUS

## 2019-05-05 MED ORDER — VANCOMYCIN HCL IN DEXTROSE 1-5 GM/200ML-% IV SOLN: 1000.0000 mg | Freq: Once | INTRAVENOUS | Status: DC

## 2019-05-05 NOTE — ED Triage Notes (Signed)
[facility]EMS called out to pt's home with reports of "dizzyness and weakness". Per EMS, on arrival pt had an altered mentation and shortly after had a tonic clonic seizure en route. Seizure lasted 52mins and then became post ictal. Pt became very combative per EMS and was given 5mg  of versed IM. Pt's HR was >200's/ with tachypnea with increased barrel chest. Breath sounds equal bilaterally..No PMH of seizures, pt is a insulin dependent diabetic: CBG 175. Pt currently in Afib. MD at bedside

## 2019-05-05 NOTE — ED Notes (Signed)
Accepted by Dr. Myna Hidalgo at [facility]

## 2019-05-05 NOTE — ED Notes (Signed)
[facility] called for [employer] transfer Weldon Picking)

## 2019-05-05 NOTE — Progress Notes (Signed)
ANTICOAGULATION CONSULT NOTE - Initial Consult  Pharmacy Consult for Heparin  Indication: pulmonary embolus  No Known Allergies  Patient Measurements: Height: 6' (182.9 cm) Weight: 212 lb (96.2 kg) IBW/kg (Calculated) : 77.6 Heparin Dosing Weight:  96.2 kg   Vital Signs: Temp: 97.4 F (36.3 C) (10/06 1956) Temp Source: Oral (10/06 1956) BP: 101/66 (10/06 1956) Pulse Rate: 110 (10/06 1956)  Labs: Recent Labs    05/04/19 1002 05/04/19 1125 05/04/19 1502 05/05/19 1832 05/05/19 2114  HGB 12.9*  --   --  12.5*  --   HCT 41.0  --   --  41.1  --   PLT 298  --   --  353  --   APTT  --   --   --   --  31  LABPROT  --   --   --   --  17.3*  INR  --   --   --   --  1.4*  CREATININE 2.85*  --  2.58* 3.06*  --   TROPONINIHS 17 17  --  29*  --     Estimated Creatinine Clearance: 28.9 mL/min (A) (by C-G formula based on SCr of 3.06 mg/dL (H)).   Medical History: Past Medical History:  Diagnosis Date  . Bleeding gastrointestinal 09/03/2018  . 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 Chronic depression 09/03/2018  . CKD (chronic kidney disease) 09/03/2018  . Coronary atherosclerosis 09/03/2018  . Coronary bypass graft mechanical complication 05/5725  . Diabetes mellitus without complication ([place])   . Gastric ulcer with hemorrhage 09/03/2018  . Gastritis 09/03/2018  . GERD (gastroesophageal reflux disease) 09/03/2018  . Heart disease   . Heart murmur   . IDA (iron deficiency anemia) 09/03/2018  . Low magnesium levels 09/03/2018  . 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 Myocardial infarction ([place]) 2017  . Personal history of radiation therapy 05/30/2018   39 treatments with chemotherapy nasopharyngeal cancer  . 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  Pt was on apixaban PTA, uncertain  when last dose was given.   CrCl = 28.9 ml/min   Goal of Therapy:  APTT = 66 - 102  Heparin level 0.3-0.7 units/ml Monitor platelets by anticoagulation protocol: Yes   Plan:  Will order baseline aPTT, INR and HL.  Pt was on apixaban PTA so will not bolus this pt.  Start heparin infusion at 1700  units/hr  Will order aPTT and HL 8 hrs after start of drip. Will use aPTT to dose until HL and aPTT coincide.   Ashleah Valtierra D 05/05/2019,9:59 PM

## 2019-05-05 NOTE — Progress Notes (Signed)
PHARMACY -  BRIEF ANTIBIOTIC NOTE   Pharmacy has received consult(s) for Vancomycin , Cefepime  from an ED provider.  The patient's profile has been reviewed for ht/wt/allergies/indication/available labs.    One time order(s) placed for Vancomycin 2 gm IV X 1 and Cefepime 2 gm IV X  1.   Further antibiotics/pharmacy consults should be ordered by admitting physician if indicated.                       Thank you, June Vacha D 05/05/2019  8:45 PM

## 2019-05-05 NOTE — ED Provider Notes (Addendum)
[facility] Emergency Department Provider Note  Time seen: 6:23 PM  I have reviewed the triage vital signs and the nursing notes.   HISTORY  Chief Complaint Dizziness, Weakness, and Seizures   HPI Dylan Goodman is a 68 y.o. male with a past medical history of CKD, diabetes, gastric reflux, CAD, COVID positive patient presents to the emergency department for weakness and dizziness that had a seizure.  According to EMS report they were called out to the patient's residence today for generalized weakness and dizziness.  Patient was seen in the emergency department yesterday for chest pressure and lightheadedness with an overall negative work-up besides mild renal insufficiency and a COVID positive test.  Patient initially tested positive approximately 3 weeks ago and was hospitalized at [facility]  EMS states shortly after arriving to the patient's residence he was able to walk to the ambulance himself but then proceeded to have a approximate 2-minute generalized tonic-clonic seizure followed by postictal confusion and combativeness.  Patient received Versed by EMS.  Upon arrival to the emergency department patient is awake, but is confused is unable to answer questions or follow commands.  Past Medical History:  Diagnosis Date  . Bleeding gastrointestinal 09/03/2018  . 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 Chronic depression 09/03/2018  . CKD (chronic kidney disease) 09/03/2018  . Coronary atherosclerosis 09/03/2018  . Coronary bypass graft mechanical complication 81/8299  . Diabetes mellitus without complication ([place])   . Gastric ulcer with hemorrhage 09/03/2018  . Gastritis 09/03/2018  . GERD (gastroesophageal reflux disease) 09/03/2018  . Heart disease   . Heart murmur   . IDA (iron deficiency anemia) 09/03/2018  . Low magnesium levels 09/03/2018  . 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 Myocardial infarction  ([place]) 2017  . Personal history of radiation therapy 05/30/2018   39 treatments with chemotherapy nasopharyngeal cancer  . 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 Acute renal failure superimposed on stage 4 chronic kidney disease ([place]) 04/13/2019  . Hypocalcemia 04/13/2019  . Hyponatremia 04/13/2019  . Metabolic acidosis 37/16/9678  . Unspecified atrial fibrillation ([place]) 04/13/2019  . COVID-19 virus infection 04/13/2019  . Acute respiratory failure with hypoxia ([place]) 04/13/2019  . Dizziness 03/12/2019  . Atherosclerosis of aorta ([place]) 01/29/2019  . Type 2 diabetes mellitus with stage 4 chronic kidney disease, with long-term current use of insulin ([place]) 09/13/2018  . Hyperlipidemia, mixed 09/13/2018  . Senile purpura ([place]) 09/08/2018  . Morbid obesity ([place]) 09/08/2018  . Chronic diarrhea of unknown origin 09/04/2018  . Menetrier's disease (hyperplastic hypersecretory gastropathy) 09/04/2018  . Chronic kidney disease 09/03/2018  . History of gastric ulcer 09/03/2018  . Gastritis 09/03/2018  . GERD (gastroesophageal reflux disease) 09/03/2018  . IDA (iron deficiency anemia) 09/03/2018  . Hypomagnesemia 09/03/2018  . History of cancer chemotherapy 09/03/2018  . Chronic systolic CHF (congestive heart failure), NYHA class 2 ([place]) 09/02/2018  . Atherosclerosis of native arteries of extremity with intermittent claudication ([place]) 07/21/2018  . Benign essential HTN 07/21/2018  . Bilateral carotid artery stenosis 07/21/2018  . 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 CAROTID ENDARTERECTOMY  2006  . COLONOSCOPY WITH PROPOFOL N/A 03/11/2019   Procedure: COLONOSCOPY WITH PROPOFOL;  Surgeon: Toledo, Benay Pike, MD;  Location: [facility] ENDOSCOPY;  Service: Gastroenterology;  Laterality:  N/A;  . CORONARY ARTERY BYPASS GRAFT  1996    quadruple bypass  . ESOPHAGOGASTRODUODENOSCOPY (EGD) WITH PROPOFOL N/A 03/11/2019   Procedure: ESOPHAGOGASTRODUODENOSCOPY (EGD) WITH PROPOFOL;  Surgeon: Toledo, Benay Pike, MD;  Location: [facility] ENDOSCOPY;  Service: Gastroenterology;  Laterality: N/A;  . heart stent  10/2010   x3  . 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 left leg stents  08/2009  . LOWER EXTREMITY ANGIOGRAPHY Left 11/11/2018   Procedure: LOWER EXTREMITY ANGIOGRAPHY;  Surgeon: Katha Cabal, MD;  Location: [facility] [facility];  Service: Cardiovascular;  Laterality: Left;  . REVISION OF AORTA BIFEMORAL BYPASS    . right femo pop bypass  06/1998   11/2001, 08/2003  . right leg stent  07/2009    Prior to Admission medications   Medication Sig Start Date End Date Taking? Authorizing Provider  apixaban (ELIQUIS) 5 MG TABS tablet Take 1 tablet (5 mg total) by mouth 2 (two) times daily. 04/18/19   Patrecia Pour, MD  budesonide-formoterol (SYMBICORT) 160-4.5 MCG/ACT inhaler Inhale 2 puffs into the lungs 2 (two) times daily. Rinse mouth after use 03/17/19   Laverle Hobby, MD  Calcium Carbonate-Vitamin D3 (CALCIUM 600-D) 600-400 MG-UNIT TABS Take 1 tablet by mouth daily.    [provider]  Cholecalciferol (VITAMIN D3) 25 MCG (1000 UT) CAPS Take by mouth.    [provider]  cilostazol (PLETAL) 100 MG tablet Take 100 mg by mouth 2 (two) times daily.     [provider]  colchicine 0.6 MG tablet Take 0.6 mg by mouth once.    [provider]  diphenoxylate-atropine (LOMOTIL) 2.5-0.025 MG tablet Take 1 tablet by mouth 4 (four) times daily as needed for diarrhea or loose stools.    [provider]  ezetimibe (ZETIA) 10 MG tablet Take 10 mg by mouth daily.  07/07/18   [provider]  fenofibrate 160 MG tablet Take 160 mg by mouth daily.     [provider]  Insulin Degludec (TRESIBA FLEXTOUCH) 200 UNIT/ML SOPN Inject 12 Units into the skin at bedtime.  05/17/18    [provider]  isosorbide mononitrate (IMDUR) 120 MG 24 hr tablet Take 120 mg by mouth daily.    [provider]  lansoprazole (PREVACID) 30 MG capsule Take 30 mg by mouth every morning.     [provider]  losartan (COZAAR) 25 MG tablet Take 25 mg by mouth daily.  08/29/18   [provider]  lovastatin (MEVACOR) 40 MG tablet Take 40 mg by mouth 2 (two) times daily.  07/01/18   [provider]  meclizine (ANTIVERT) 12.5 MG tablet Take 1 tablet (12.5 mg total) by mouth 3 (three) times daily as needed for dizziness. 04/05/19   Nena Polio, MD  metoprolol tartrate (LOPRESSOR) 100 MG tablet Take 1 tablet (100 mg total) by mouth 2 (two) times daily. 04/18/19   Patrecia Pour, MD  nitroGLYCERIN (NITROSTAT) 0.4 MG SL tablet Place 0.4 mg under the tongue every 5 (five) minutes as needed for chest pain.  07/01/18   [provider]  sodium bicarbonate 650 MG tablet Take 1 tablet by mouth daily.  12/31/18   Kolluru, Lurena Nida, MD  ticagrelor (BRILINTA) 60 MG TABS tablet Take 60 mg by mouth 2 (two) times daily.     [provider]    No Known Allergies  Family History  Problem Relation Age of Onset  . Coronary artery disease Mother   .  Alcohol abuse Father   . Colon cancer Father   . Coronary artery disease Father   . Kidney cancer Father   . Breast cancer Sister     Social History Social History   Tobacco Use  . Smoking status: Former Smoker    Packs/day: 1.50    Years: 50.00    Pack years: 75.00    Types: Cigarettes    Quit date: 02/18/2017    Years since quitting: 2.2  . Smokeless tobacco: Never Used  Substance Use Topics  . Alcohol use: Not Currently  . Drug use: Never    Review of Systems Unable to obtain adequate/accurate review of systems secondary to altered mental status/postictal status ____________________________________________   PHYSICAL EXAM:  Constitutional: Patient is awake and alert but unable to follow  most commands or answer most questions. Eyes: Normal exam ENT      Head: Normocephalic and atraumatic.      Mouth/Throat: Mucous membranes are moist. Cardiovascular: Appears to have a regular rhythm rate around 130 to 140 bpm Respiratory: Patient is tachypneic with rhonchi auscultated in the right lung fields. Gastrointestinal: Soft and nontender. No distention.   Musculoskeletal: Nontender with normal range of motion in all extremities.  Neurologic: Patient is awake and alert but unable to follow commands unable to perform an adequate neurological exam at this time but does appear to be moving all extremities spontaneously. Skin:  Skin is warm, dry and intact.  Psychiatric: Mood and affect are normal.   ____________________________________________    EKG  EKG viewed and interpreted by myself shows atrial fibrillation 144 bpm with a widened QRS, normal axis, slight QTC prolongation otherwise largely normal intervals with nonspecific ST changes but no ST elevation.  ____________________________________________    RADIOLOGY  Chest x-ray shows increased opacities bilateral lower lobes. CT head negative  ____________________________________________   INITIAL IMPRESSION / ASSESSMENT AND PLAN / ED COURSE  Pertinent labs & imaging results that were available during my care of the patient were reviewed by me and considered in my medical decision making (see chart for details).   Patient presents to the emergency department for dizziness and weakness then had a 2-minute tonic-clonic seizure witnessed by EMS and is now postictal upon arrival.  No reported history of seizures.  Here the patient is awake and alert, is confused unable to follow most commands unable to answer questions.  Patient is COVID positive as of 04/11/2020 the patient was admitted to [facility], had a cover test yesterday that remained positive, versus recurrent infection.  We will check labs, repeat chest  x-ray, CT scan of the head.  We will IV hydrate and continue to closely monitor.  EMS reports patient was quite combative after the seizure he is no longer combative, hopefully resolving postictal state.  Patient's chest x-ray shows worsening bilateral infiltrates.  Patient is hypoxic currently on 2 L of oxygen satting 86%.  I have increased patient to 4 L of oxygen.  He is now awake alert and oriented.  Symptoms are very consistent worsening COVID infection.  Lab work shows renal insufficiency with a bicarb of 11 however that could be due to to recent seizure.  We will continue with IV hydration.  We will discuss with [facility] for possible transfer.  I discussed patient Esmond Plants however as patient's hypoxia is increased significantly we would like to get a CTA however given the patient's renal insufficiency we cannot get a CTA.  We will cover with heparin as a  precaution to cover for possible PE.  I discussed patient with [facility] they will accept to their service where they could get an EEG as well as VQ scan.  Patient will be transferred to [facility] once transport is available.  Carroll Lingelbach was evaluated in Emergency Department on 05/05/2019 for the symptoms described in the history of present illness. He was evaluated in the context of the global COVID-19 pandemic, which necessitated consideration that the patient might be at risk for infection with the SARS-CoV-2 virus that causes COVID-19. Institutional protocols and algorithms that pertain to the evaluation of patients at risk for COVID-19 are in a state of rapid change based on information released by regulatory bodies including the CDC and federal and state organizations. These policies and algorithms were followed during the patient's care in the ED.   CRITICAL CARE Performed by: Harvest Dark   Total critical care time: 45 minutes  Critical care time was exclusive of separately billable procedures and treating other  patients.  Critical care was necessary to treat or prevent imminent or life-threatening deterioration.  Critical care was time spent personally by me on the following activities: development of treatment plan with patient and/or surrogate as well as nursing, discussions with consultants, evaluation of patient's response to treatment, examination of patient, obtaining history from patient or surrogate, ordering and performing treatments and interventions, ordering and review of laboratory studies, ordering and review of radiographic studies, pulse oximetry and re-evaluation of patient's condition.  ____________________________________________   FINAL CLINICAL IMPRESSION(S) / ED DIAGNOSES  Seizure COVID-19 hypoxia   Harvest Dark, MD 05/05/19 2214    Harvest Dark, MD 05/05/19 2215

## 2019-05-06 ENCOUNTER — Encounter (HOSPITAL_COMMUNITY): Payer: Self-pay | Admitting: Cardiology

## 2019-05-06 ENCOUNTER — Inpatient Hospital Stay (HOSPITAL_COMMUNITY): Payer: Medicare Other

## 2019-05-06 ENCOUNTER — Inpatient Hospital Stay (HOSPITAL_COMMUNITY)
Admission: AD | Admit: 2019-05-06 | Discharge: 2019-06-05 | DRG: 208 | Disposition: A | Discharge status: Home Health | Payer: Medicare Other | Source: Other Acute Inpatient Hospital | Attending: Family Medicine | Admitting: Family Medicine

## 2019-05-06 ENCOUNTER — Telehealth: Payer: Self-pay

## 2019-05-06 ENCOUNTER — Emergency Department: Payer: Medicare Other

## 2019-05-06 DIAGNOSIS — R339 Retention of urine, unspecified: Secondary | ICD-10-CM | POA: Diagnosis present

## 2019-05-06 DIAGNOSIS — N400 Enlarged prostate without lower urinary tract symptoms: Secondary | ICD-10-CM | POA: Diagnosis present

## 2019-05-06 DIAGNOSIS — I4819 Other persistent atrial fibrillation: Secondary | ICD-10-CM | POA: Diagnosis not present

## 2019-05-06 DIAGNOSIS — J069 Acute upper respiratory infection, unspecified: Secondary | ICD-10-CM | POA: Diagnosis not present

## 2019-05-06 DIAGNOSIS — K219 Gastro-esophageal reflux disease without esophagitis: Secondary | ICD-10-CM | POA: Diagnosis present

## 2019-05-06 DIAGNOSIS — J9601 Acute respiratory failure with hypoxia: Secondary | ICD-10-CM | POA: Diagnosis present

## 2019-05-06 DIAGNOSIS — K2960 Other gastritis without bleeding: Secondary | ICD-10-CM | POA: Diagnosis present

## 2019-05-06 DIAGNOSIS — F05 Delirium due to known physiological condition: Secondary | ICD-10-CM | POA: Diagnosis present

## 2019-05-06 DIAGNOSIS — E860 Dehydration: Secondary | ICD-10-CM | POA: Diagnosis present

## 2019-05-06 DIAGNOSIS — I429 Cardiomyopathy, unspecified: Secondary | ICD-10-CM

## 2019-05-06 DIAGNOSIS — I252 Old myocardial infarction: Secondary | ICD-10-CM

## 2019-05-06 DIAGNOSIS — I25119 Atherosclerotic heart disease of native coronary artery with unspecified angina pectoris: Secondary | ICD-10-CM

## 2019-05-06 DIAGNOSIS — I428 Other cardiomyopathies: Secondary | ICD-10-CM | POA: Diagnosis present

## 2019-05-06 DIAGNOSIS — I452 Bifascicular block: Secondary | ICD-10-CM | POA: Diagnosis present

## 2019-05-06 DIAGNOSIS — U071 COVID-19: Secondary | ICD-10-CM | POA: Diagnosis not present

## 2019-05-06 DIAGNOSIS — Z8249 Family history of ischemic heart disease and other diseases of the circulatory system: Secondary | ICD-10-CM

## 2019-05-06 DIAGNOSIS — N136 Pyonephrosis: Secondary | ICD-10-CM | POA: Diagnosis not present

## 2019-05-06 DIAGNOSIS — N179 Acute kidney failure, unspecified: Secondary | ICD-10-CM | POA: Diagnosis present

## 2019-05-06 DIAGNOSIS — R319 Hematuria, unspecified: Secondary | ICD-10-CM

## 2019-05-06 DIAGNOSIS — Z85818 Personal history of malignant neoplasm of other sites of lip, oral cavity, and pharynx: Secondary | ICD-10-CM | POA: Diagnosis not present

## 2019-05-06 DIAGNOSIS — Z8711 Personal history of peptic ulcer disease: Secondary | ICD-10-CM

## 2019-05-06 DIAGNOSIS — J1289 Other viral pneumonia: Secondary | ICD-10-CM | POA: Diagnosis not present

## 2019-05-06 DIAGNOSIS — I5023 Acute on chronic systolic (congestive) heart failure: Secondary | ICD-10-CM | POA: Diagnosis present

## 2019-05-06 DIAGNOSIS — I469 Cardiac arrest, cause unspecified: Secondary | ICD-10-CM | POA: Diagnosis present

## 2019-05-06 DIAGNOSIS — R0902 Hypoxemia: Secondary | ICD-10-CM

## 2019-05-06 DIAGNOSIS — Z7951 Long term (current) use of inhaled steroids: Secondary | ICD-10-CM

## 2019-05-06 DIAGNOSIS — G9340 Encephalopathy, unspecified: Secondary | ICD-10-CM

## 2019-05-06 DIAGNOSIS — Z1612 Extended spectrum beta lactamase (ESBL) resistance: Secondary | ICD-10-CM | POA: Diagnosis not present

## 2019-05-06 DIAGNOSIS — R5381 Other malaise: Secondary | ICD-10-CM | POA: Diagnosis present

## 2019-05-06 DIAGNOSIS — Z8701 Personal history of pneumonia (recurrent): Secondary | ICD-10-CM

## 2019-05-06 DIAGNOSIS — J1282 Pneumonia due to coronavirus disease 2019: Secondary | ICD-10-CM

## 2019-05-06 DIAGNOSIS — E870 Hyperosmolality and hypernatremia: Secondary | ICD-10-CM | POA: Diagnosis present

## 2019-05-06 DIAGNOSIS — K7200 Acute and subacute hepatic failure without coma: Secondary | ICD-10-CM | POA: Diagnosis present

## 2019-05-06 DIAGNOSIS — A419 Sepsis, unspecified organism: Secondary | ICD-10-CM | POA: Diagnosis not present

## 2019-05-06 DIAGNOSIS — N170 Acute kidney failure with tubular necrosis: Secondary | ICD-10-CM | POA: Diagnosis present

## 2019-05-06 DIAGNOSIS — Z1621 Resistance to vancomycin: Secondary | ICD-10-CM | POA: Diagnosis not present

## 2019-05-06 DIAGNOSIS — N029 Recurrent and persistent hematuria with unspecified morphologic changes: Secondary | ICD-10-CM | POA: Diagnosis not present

## 2019-05-06 DIAGNOSIS — I4891 Unspecified atrial fibrillation: Secondary | ICD-10-CM | POA: Diagnosis not present

## 2019-05-06 DIAGNOSIS — R569 Unspecified convulsions: Secondary | ICD-10-CM | POA: Diagnosis not present

## 2019-05-06 DIAGNOSIS — I2510 Atherosclerotic heart disease of native coronary artery without angina pectoris: Secondary | ICD-10-CM | POA: Diagnosis present

## 2019-05-06 DIAGNOSIS — N189 Chronic kidney disease, unspecified: Secondary | ICD-10-CM | POA: Diagnosis not present

## 2019-05-06 DIAGNOSIS — I82409 Acute embolism and thrombosis of unspecified deep veins of unspecified lower extremity: Secondary | ICD-10-CM | POA: Diagnosis not present

## 2019-05-06 DIAGNOSIS — E875 Hyperkalemia: Secondary | ICD-10-CM | POA: Diagnosis not present

## 2019-05-06 DIAGNOSIS — B952 Enterococcus as the cause of diseases classified elsewhere: Secondary | ICD-10-CM | POA: Diagnosis not present

## 2019-05-06 DIAGNOSIS — G40909 Epilepsy, unspecified, not intractable, without status epilepticus: Secondary | ICD-10-CM | POA: Diagnosis present

## 2019-05-06 DIAGNOSIS — Z951 Presence of aortocoronary bypass graft: Secondary | ICD-10-CM

## 2019-05-06 DIAGNOSIS — L8990 Pressure ulcer of unspecified site, unspecified stage: Secondary | ICD-10-CM | POA: Insufficient documentation

## 2019-05-06 DIAGNOSIS — N184 Chronic kidney disease, stage 4 (severe): Secondary | ICD-10-CM | POA: Diagnosis present

## 2019-05-06 DIAGNOSIS — E2740 Unspecified adrenocortical insufficiency: Secondary | ICD-10-CM | POA: Diagnosis present

## 2019-05-06 DIAGNOSIS — I714 Abdominal aortic aneurysm, without rupture: Secondary | ICD-10-CM | POA: Diagnosis present

## 2019-05-06 DIAGNOSIS — E1122 Type 2 diabetes mellitus with diabetic chronic kidney disease: Secondary | ICD-10-CM | POA: Diagnosis present

## 2019-05-06 DIAGNOSIS — T380X5A Adverse effect of glucocorticoids and synthetic analogues, initial encounter: Secondary | ICD-10-CM | POA: Diagnosis present

## 2019-05-06 DIAGNOSIS — I130 Hypertensive heart and chronic kidney disease with heart failure and stage 1 through stage 4 chronic kidney disease, or unspecified chronic kidney disease: Secondary | ICD-10-CM | POA: Diagnosis present

## 2019-05-06 DIAGNOSIS — J432 Centrilobular emphysema: Secondary | ICD-10-CM | POA: Diagnosis present

## 2019-05-06 DIAGNOSIS — R9431 Abnormal electrocardiogram [ECG] [EKG]: Secondary | ICD-10-CM

## 2019-05-06 DIAGNOSIS — Z789 Other specified health status: Secondary | ICD-10-CM

## 2019-05-06 DIAGNOSIS — I959 Hypotension, unspecified: Secondary | ICD-10-CM | POA: Diagnosis not present

## 2019-05-06 DIAGNOSIS — F410 Panic disorder [episodic paroxysmal anxiety] without agoraphobia: Secondary | ICD-10-CM | POA: Diagnosis present

## 2019-05-06 DIAGNOSIS — R6521 Severe sepsis with septic shock: Secondary | ICD-10-CM | POA: Diagnosis not present

## 2019-05-06 DIAGNOSIS — I70219 Atherosclerosis of native arteries of extremities with intermittent claudication, unspecified extremity: Secondary | ICD-10-CM

## 2019-05-06 DIAGNOSIS — J810 Acute pulmonary edema: Secondary | ICD-10-CM

## 2019-05-06 DIAGNOSIS — F329 Major depressive disorder, single episode, unspecified: Secondary | ICD-10-CM | POA: Diagnosis present

## 2019-05-06 DIAGNOSIS — R0603 Acute respiratory distress: Secondary | ICD-10-CM | POA: Diagnosis not present

## 2019-05-06 DIAGNOSIS — E872 Acidosis: Secondary | ICD-10-CM | POA: Diagnosis present

## 2019-05-06 DIAGNOSIS — J438 Other emphysema: Secondary | ICD-10-CM | POA: Diagnosis present

## 2019-05-06 DIAGNOSIS — G7281 Critical illness myopathy: Secondary | ICD-10-CM | POA: Diagnosis present

## 2019-05-06 DIAGNOSIS — Z85828 Personal history of other malignant neoplasm of skin: Secondary | ICD-10-CM

## 2019-05-06 DIAGNOSIS — G92 Toxic encephalopathy: Secondary | ICD-10-CM | POA: Diagnosis present

## 2019-05-06 DIAGNOSIS — N390 Urinary tract infection, site not specified: Secondary | ICD-10-CM | POA: Diagnosis not present

## 2019-05-06 DIAGNOSIS — T424X5A Adverse effect of benzodiazepines, initial encounter: Secondary | ICD-10-CM | POA: Diagnosis present

## 2019-05-06 DIAGNOSIS — Z9221 Personal history of antineoplastic chemotherapy: Secondary | ICD-10-CM | POA: Diagnosis not present

## 2019-05-06 DIAGNOSIS — E876 Hypokalemia: Secondary | ICD-10-CM | POA: Diagnosis not present

## 2019-05-06 DIAGNOSIS — R230 Cyanosis: Secondary | ICD-10-CM | POA: Diagnosis not present

## 2019-05-06 DIAGNOSIS — L89151 Pressure ulcer of sacral region, stage 1: Secondary | ICD-10-CM | POA: Diagnosis present

## 2019-05-06 DIAGNOSIS — E1151 Type 2 diabetes mellitus with diabetic peripheral angiopathy without gangrene: Secondary | ICD-10-CM | POA: Diagnosis present

## 2019-05-06 DIAGNOSIS — K529 Noninfective gastroenteritis and colitis, unspecified: Secondary | ICD-10-CM | POA: Diagnosis present

## 2019-05-06 DIAGNOSIS — I472 Ventricular tachycardia: Secondary | ICD-10-CM | POA: Diagnosis present

## 2019-05-06 DIAGNOSIS — I5022 Chronic systolic (congestive) heart failure: Secondary | ICD-10-CM | POA: Diagnosis present

## 2019-05-06 DIAGNOSIS — J9690 Respiratory failure, unspecified, unspecified whether with hypoxia or hypercapnia: Secondary | ICD-10-CM

## 2019-05-06 DIAGNOSIS — Z923 Personal history of irradiation: Secondary | ICD-10-CM | POA: Diagnosis not present

## 2019-05-06 DIAGNOSIS — I361 Nonrheumatic tricuspid (valve) insufficiency: Secondary | ICD-10-CM | POA: Diagnosis not present

## 2019-05-06 DIAGNOSIS — D509 Iron deficiency anemia, unspecified: Secondary | ICD-10-CM | POA: Diagnosis present

## 2019-05-06 DIAGNOSIS — I272 Pulmonary hypertension, unspecified: Secondary | ICD-10-CM | POA: Diagnosis present

## 2019-05-06 DIAGNOSIS — B961 Klebsiella pneumoniae [K. pneumoniae] as the cause of diseases classified elsewhere: Secondary | ICD-10-CM | POA: Diagnosis present

## 2019-05-06 DIAGNOSIS — I480 Paroxysmal atrial fibrillation: Secondary | ICD-10-CM | POA: Diagnosis not present

## 2019-05-06 DIAGNOSIS — I482 Chronic atrial fibrillation, unspecified: Secondary | ICD-10-CM | POA: Diagnosis present

## 2019-05-06 DIAGNOSIS — E785 Hyperlipidemia, unspecified: Secondary | ICD-10-CM

## 2019-05-06 DIAGNOSIS — I082 Rheumatic disorders of both aortic and tricuspid valves: Secondary | ICD-10-CM | POA: Diagnosis present

## 2019-05-06 DIAGNOSIS — I340 Nonrheumatic mitral (valve) insufficiency: Secondary | ICD-10-CM | POA: Diagnosis not present

## 2019-05-06 DIAGNOSIS — Z7901 Long term (current) use of anticoagulants: Secondary | ICD-10-CM

## 2019-05-06 DIAGNOSIS — J9600 Acute respiratory failure, unspecified whether with hypoxia or hypercapnia: Secondary | ICD-10-CM

## 2019-05-06 DIAGNOSIS — Z8619 Personal history of other infectious and parasitic diseases: Secondary | ICD-10-CM

## 2019-05-06 DIAGNOSIS — Z87891 Personal history of nicotine dependence: Secondary | ICD-10-CM

## 2019-05-06 DIAGNOSIS — I255 Ischemic cardiomyopathy: Secondary | ICD-10-CM | POA: Diagnosis present

## 2019-05-06 DIAGNOSIS — Z9861 Coronary angioplasty status: Secondary | ICD-10-CM

## 2019-05-06 DIAGNOSIS — Z4659 Encounter for fitting and adjustment of other gastrointestinal appliance and device: Secondary | ICD-10-CM

## 2019-05-06 DIAGNOSIS — Z79899 Other long term (current) drug therapy: Secondary | ICD-10-CM

## 2019-05-06 HISTORY — DX: Cardiomyopathy, unspecified: I42.9

## 2019-05-06 HISTORY — DX: Type 2 diabetes mellitus without complications: E11.9

## 2019-05-06 HISTORY — DX: Benign prostatic hyperplasia without lower urinary tract symptoms: N40.0

## 2019-05-06 HISTORY — DX: Atherosclerotic heart disease of native coronary artery without angina pectoris: I25.10

## 2019-05-06 LAB — URINALYSIS, ROUTINE W REFLEX MICROSCOPIC
Bilirubin Urine: NEGATIVE
Glucose, UA: 50 mg/dL — AB
Ketones, ur: NEGATIVE mg/dL
Leukocytes,Ua: NEGATIVE
Nitrite: NEGATIVE
Protein, ur: 100 mg/dL — AB
Specific Gravity, Urine: 1.017 (ref 1.005–1.030)
pH: 5 (ref 5.0–8.0)

## 2019-05-06 LAB — POCT I-STAT 7, (LYTES, BLD GAS, ICA,H+H)
Acid-base deficit: 11 mmol/L — ABNORMAL HIGH (ref 0.0–2.0)
Bicarbonate: 15.4 mmol/L — ABNORMAL LOW (ref 20.0–28.0)
Calcium, Ion: 0.96 mmol/L — ABNORMAL LOW (ref 1.15–1.40)
HCT: 36 % — ABNORMAL LOW (ref 39.0–52.0)
Hemoglobin: 12.2 g/dL — ABNORMAL LOW (ref 13.0–17.0)
O2 Saturation: 99 %
Patient temperature: 98.1
Potassium: 3.8 mmol/L (ref 3.5–5.1)
Sodium: 140 mmol/L (ref 135–145)
TCO2: 16 mmol/L — ABNORMAL LOW (ref 22–32)
pCO2 arterial: 34.6 mmHg (ref 32.0–48.0)
pH, Arterial: 7.254 — ABNORMAL LOW (ref 7.350–7.450)
pO2, Arterial: 139 mmHg — ABNORMAL HIGH (ref 83.0–108.0)

## 2019-05-06 LAB — GLUCOSE, CAPILLARY: Glucose-Capillary: 201 mg/dL — ABNORMAL HIGH (ref 70–99)

## 2019-05-06 LAB — BLOOD GAS, ARTERIAL
Acid-base deficit: 22.3 mmol/L — ABNORMAL HIGH (ref 0.0–2.0)
Bicarbonate: 10.6 mmol/L — ABNORMAL LOW (ref 20.0–28.0)
FIO2: 1
MECHVT: 500 mL
Mechanical Rate: 16
O2 Saturation: 99.3 %
PEEP: 5 cmH2O
Patient temperature: 37
RATE: 16 resp/min
pCO2 arterial: 53 mmHg — ABNORMAL HIGH (ref 32.0–48.0)
pH, Arterial: 6.91 — CL (ref 7.350–7.450)
pO2, Arterial: 220 mmHg — ABNORMAL HIGH (ref 83.0–108.0)

## 2019-05-06 LAB — COMPREHENSIVE METABOLIC PANEL
ALT: 329 U/L — ABNORMAL HIGH (ref 0–44)
ALT: 376 U/L — ABNORMAL HIGH (ref 0–44)
AST: 628 U/L — ABNORMAL HIGH (ref 15–41)
AST: 1197 U/L — ABNORMAL HIGH (ref 15–41)
Albumin: 2.4 g/dL — ABNORMAL LOW (ref 3.5–5.0)
Albumin: 2.4 g/dL — ABNORMAL LOW (ref 3.5–5.0)
Alkaline Phosphatase: 61 U/L (ref 38–126)
Alkaline Phosphatase: 91 U/L (ref 38–126)
Anion gap: 19 — ABNORMAL HIGH (ref 5–15)
Anion gap: 15 (ref 5–15)
BUN: 39 mg/dL — ABNORMAL HIGH (ref 8–23)
BUN: 42 mg/dL — ABNORMAL HIGH (ref 8–23)
CO2: 10 mmol/L — ABNORMAL LOW (ref 22–32)
CO2: 14 mmol/L — ABNORMAL LOW (ref 22–32)
Calcium: 6.3 mg/dL — CL (ref 8.9–10.3)
Calcium: 6.2 mg/dL — CL (ref 8.9–10.3)
Chloride: 112 mmol/L — ABNORMAL HIGH (ref 98–111)
Chloride: 110 mmol/L (ref 98–111)
Creatinine, Ser: 2.83 mg/dL — ABNORMAL HIGH (ref 0.61–1.24)
Creatinine, Ser: 3.63 mg/dL — ABNORMAL HIGH (ref 0.61–1.24)
GFR calc Af Amer: 26 mL/min — ABNORMAL LOW (ref >60)
GFR calc Af Amer: 19 mL/min — ABNORMAL LOW (ref >60)
GFR calc non Af Amer: 22 mL/min — ABNORMAL LOW (ref >60)
GFR calc non Af Amer: 17 mL/min — ABNORMAL LOW (ref >60)
Glucose, Bld: 246 mg/dL — ABNORMAL HIGH (ref 70–99)
Glucose, Bld: 146 mg/dL — ABNORMAL HIGH (ref 70–99)
Potassium: 4.1 mmol/L (ref 3.5–5.1)
Potassium: 3.7 mmol/L (ref 3.5–5.1)
Sodium: 141 mmol/L (ref 135–145)
Sodium: 139 mmol/L (ref 135–145)
Total Bilirubin: 0.6 mg/dL (ref 0.3–1.2)
Total Bilirubin: 0.9 mg/dL (ref 0.3–1.2)
Total Protein: 5.3 g/dL — ABNORMAL LOW (ref 6.5–8.1)
Total Protein: 5.5 g/dL — ABNORMAL LOW (ref 6.5–8.1)

## 2019-05-06 LAB — HEPARIN LEVEL (UNFRACTIONATED)
Heparin Unfractionated: 0.61 IU/mL (ref 0.30–0.70)
Heparin Unfractionated: 0.87 IU/mL — ABNORMAL HIGH (ref 0.30–0.70)

## 2019-05-06 LAB — CBC WITH DIFFERENTIAL/PLATELET
Abs Immature Granulocytes: 0.44 10*3/uL — ABNORMAL HIGH (ref 0.00–0.07)
Basophils Absolute: 0 10*3/uL (ref 0.0–0.1)
Basophils Relative: 0 %
Eosinophils Absolute: 0 10*3/uL (ref 0.0–0.5)
Eosinophils Relative: 0 %
HCT: 40.4 % (ref 39.0–52.0)
Hemoglobin: 11.5 g/dL — ABNORMAL LOW (ref 13.0–17.0)
Immature Granulocytes: 9 %
Lymphocytes Relative: 51 %
Lymphs Abs: 2.4 10*3/uL (ref 0.7–4.0)
MCH: 28.5 pg (ref 26.0–34.0)
MCHC: 28.5 g/dL — ABNORMAL LOW (ref 30.0–36.0)
MCV: 100.2 fL — ABNORMAL HIGH (ref 80.0–100.0)
Monocytes Absolute: 0.4 10*3/uL (ref 0.1–1.0)
Monocytes Relative: 8 %
Neutro Abs: 1.5 10*3/uL — ABNORMAL LOW (ref 1.7–7.7)
Neutrophils Relative %: 32 %
Platelets: 244 10*3/uL (ref 150–400)
RBC: 4.03 MIL/uL — ABNORMAL LOW (ref 4.22–5.81)
RDW: 15.1 % (ref 11.5–15.5)
WBC: 4.7 10*3/uL (ref 4.0–10.5)
nRBC: 0.4 % — ABNORMAL HIGH (ref 0.0–0.2)

## 2019-05-06 LAB — MAGNESIUM
Magnesium: 1.9 mg/dL (ref 1.7–2.4)
Magnesium: 1.1 mg/dL — ABNORMAL LOW (ref 1.7–2.4)

## 2019-05-06 LAB — MRSA PCR SCREENING: MRSA by PCR: NEGATIVE

## 2019-05-06 LAB — CORTISOL: Cortisol, Plasma: 6.3 ug/dL

## 2019-05-06 LAB — BRAIN NATRIURETIC PEPTIDE: B Natriuretic Peptide: 379.2 pg/mL — ABNORMAL HIGH (ref 0.0–100.0)

## 2019-05-06 LAB — PROCALCITONIN: Procalcitonin: 14.1 ng/mL

## 2019-05-06 LAB — PROTIME-INR
INR: 1.5 — ABNORMAL HIGH (ref 0.8–1.2)
Prothrombin Time: 18 seconds — ABNORMAL HIGH (ref 11.4–15.2)

## 2019-05-06 LAB — APTT
aPTT: 42 seconds — ABNORMAL HIGH (ref 24–36)
aPTT: 64 seconds — ABNORMAL HIGH (ref 24–36)
aPTT: 118 seconds — ABNORMAL HIGH (ref 24–36)

## 2019-05-06 LAB — CBC
HCT: 37.4 % — ABNORMAL LOW (ref 39.0–52.0)
Hemoglobin: 12 g/dL — ABNORMAL LOW (ref 13.0–17.0)
MCH: 29.9 pg (ref 26.0–34.0)
MCHC: 32.1 g/dL (ref 30.0–36.0)
MCV: 93 fL (ref 80.0–100.0)
Platelets: 342 10*3/uL (ref 150–400)
RBC: 4.02 MIL/uL — ABNORMAL LOW (ref 4.22–5.81)
RDW: 15.2 % (ref 11.5–15.5)
WBC: 11.1 10*3/uL — ABNORMAL HIGH (ref 4.0–10.5)
nRBC: 0.4 % — ABNORMAL HIGH (ref 0.0–0.2)

## 2019-05-06 LAB — D-DIMER, QUANTITATIVE: D-Dimer, Quant: >20 ug/mL-FEU — ABNORMAL HIGH (ref 0.00–0.50)

## 2019-05-06 LAB — TROPONIN I (HIGH SENSITIVITY): Troponin I (High Sensitivity): 243 ng/L (ref <18)

## 2019-05-06 LAB — LACTIC ACID, PLASMA: Lactic Acid, Venous: 1.1 mmol/L (ref 0.5–1.9)

## 2019-05-06 LAB — PHOSPHORUS: Phosphorus: 5.6 mg/dL — ABNORMAL HIGH (ref 2.5–4.6)

## 2019-05-06 LAB — C-REACTIVE PROTEIN: CRP: 1.5 mg/dL — ABNORMAL HIGH (ref <1.0)

## 2019-05-06 IMAGING — DX DG CHEST 1V PORT
1 series · 1 of 1 positions shown · non-contrast
Comparison: [DATE] and [DATE].

CLINICAL DATA: Endotracheal tube placement.

EXAM:
PORTABLE CHEST 1 VIEW

[chest ap]
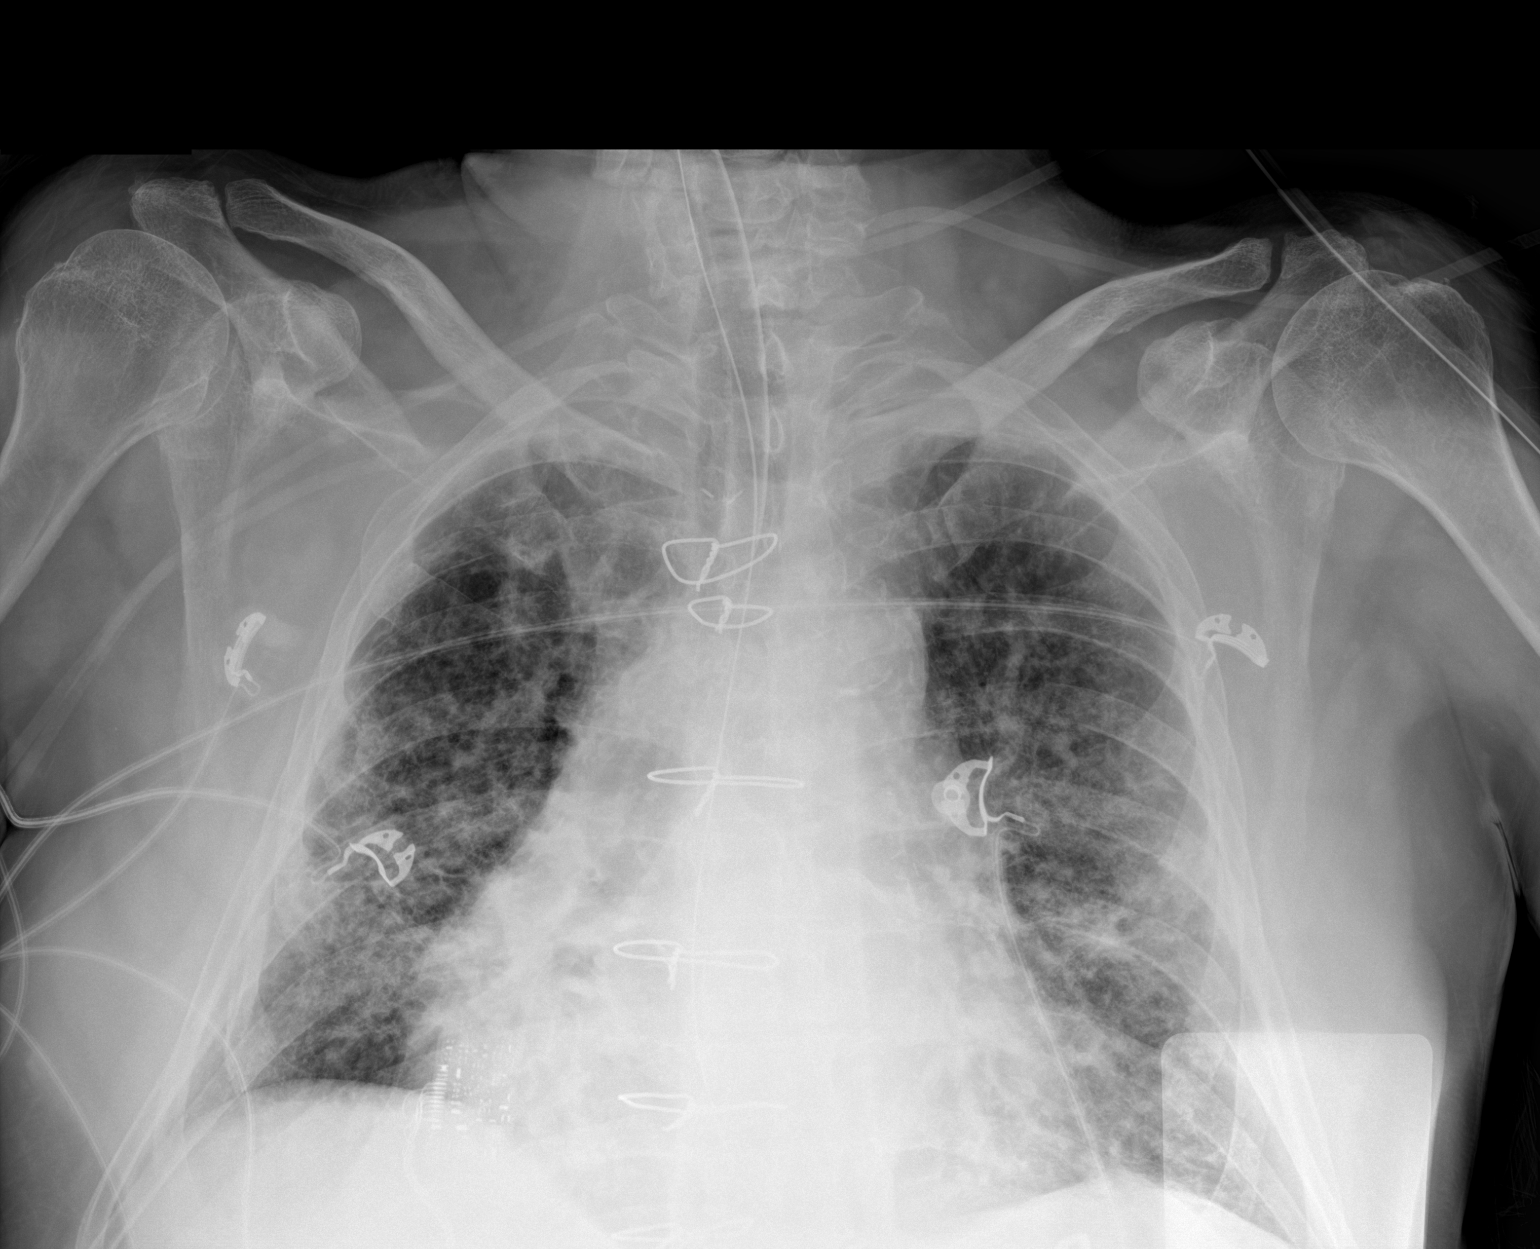

[1 of 1 positions shown; findings below may reference images not displayed]

FINDINGS: Endotracheal tube terminates 5.3 cm above the carina. Nasogastric
tube is followed into the distal esophagus with the tip projecting
beyond the inferior margin of the image. Heart is enlarged, stable.
Thoracic aorta is calcified. Apparent worsening in mixed
interstitial and airspace opacification may be due in part to lower
lung volumes on the current study. Apparent lucency in the periphery
of the upper right hemithorax is unchanged from [DATE] and may
be due to bullous emphysema.
IMPRESSION: 1. Endotracheal tube is in satisfactory position.
2. Apparent worsening in mixed interstitial and airspace
opacification may be due to expiratory phase imaging versus
worsening pulmonary edema.

## 2019-05-06 IMAGING — DX DG CHEST 1V PORT
1 series · 1 of 1 positions shown · non-contrast
Comparison: [DATE]

CLINICAL DATA: Check endotracheal tube placement

EXAM:
PORTABLE CHEST 1 VIEW

[chest]
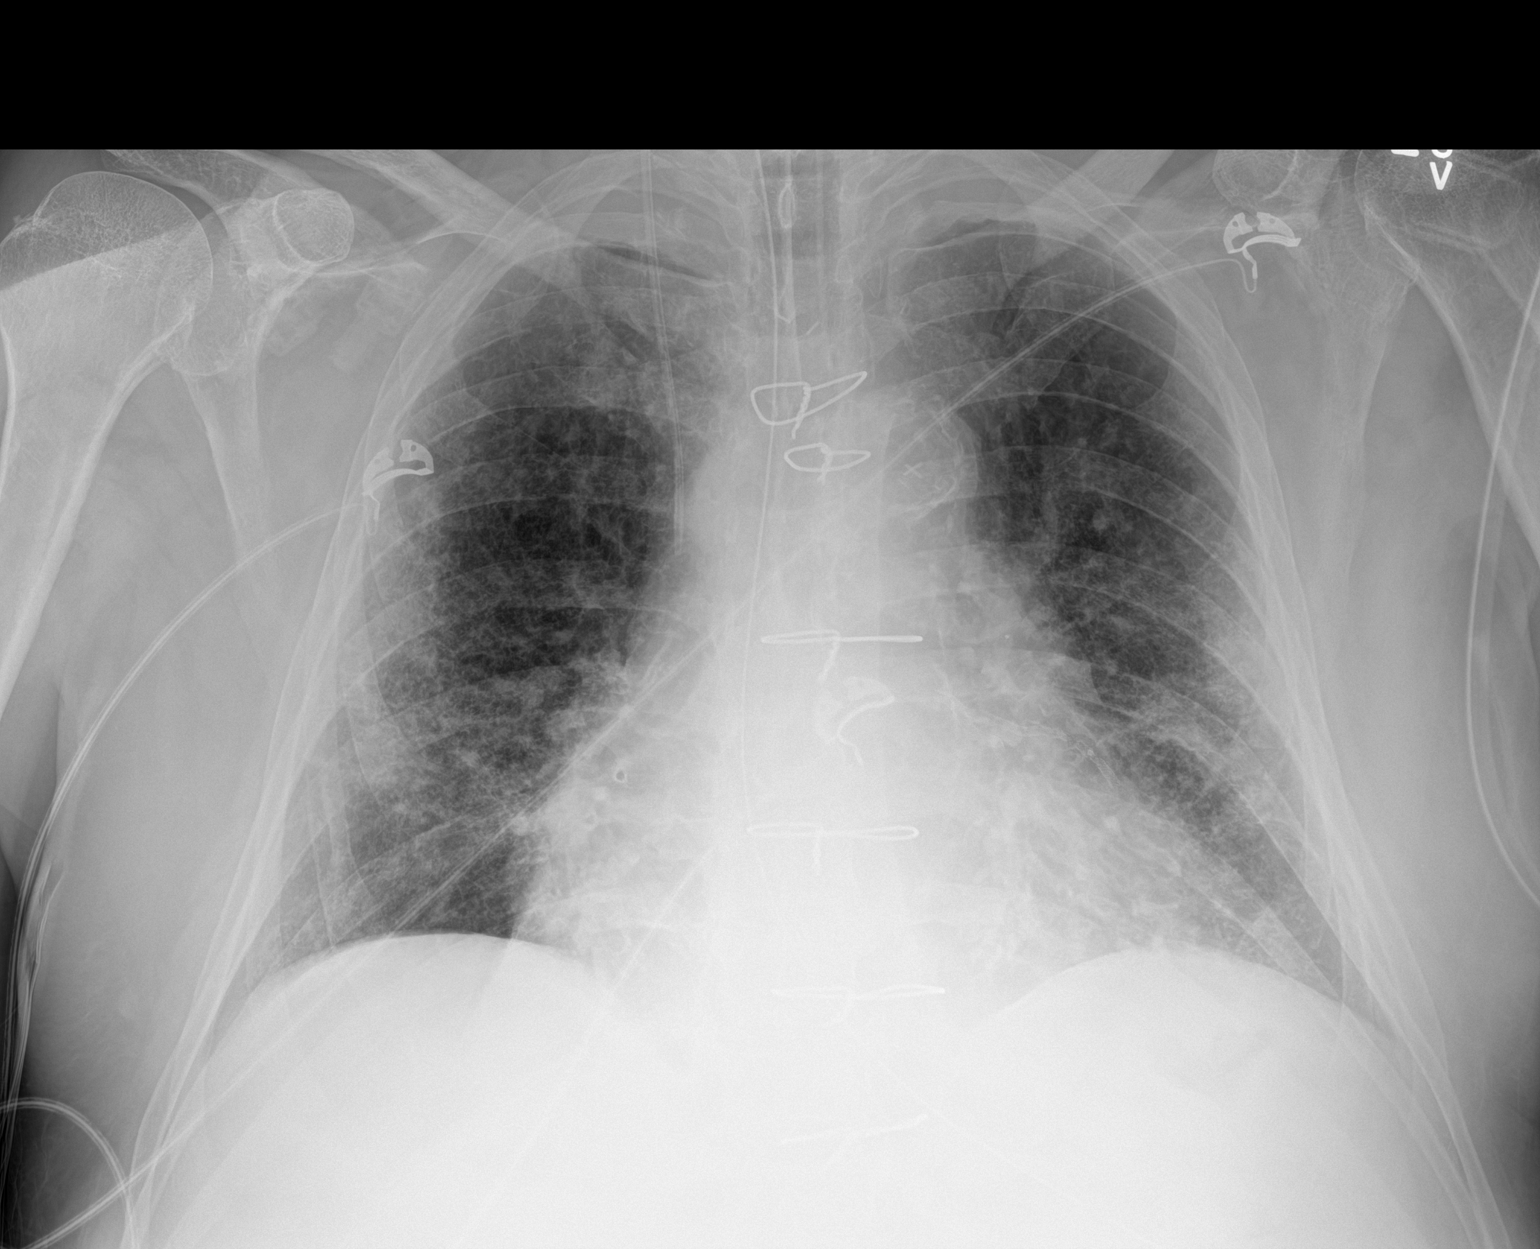

[1 of 1 positions shown; findings below may reference images not displayed]

FINDINGS: Endotracheal tube, gastric catheter and right jugular central line
are noted in satisfactory position. No pneumothorax is noted. Patchy
bilateral infiltrates are again seen but slightly improved when
compared with the prior study. No sizable effusion is noted. No bony
abnormality is seen.
IMPRESSION: No pneumothorax following central line placement.

Tubes and lines as described above.

Bilateral patchy infiltrates slightly improved from the prior study.

## 2019-05-06 MED ORDER — SODIUM CHLORIDE 0.9% FLUSH: 3.0000 mL | INTRAVENOUS | Status: DC | PRN

## 2019-05-06 MED ORDER — HEPARIN SODIUM (PORCINE) 5000 UNIT/ML IJ SOLN: 5000.0000 [IU] | Freq: Three times a day (TID) | INTRAMUSCULAR | Status: DC

## 2019-05-06 MED ORDER — NOREPINEPHRINE 4 MG/250ML-% IV SOLN
0.0000 ug/min | INTRAVENOUS | Status: DC
  Administered 2019-05-06 (×3): 20 ug/min via INTRAVENOUS
  Administered 2019-05-07: 10 ug/min via INTRAVENOUS
  Filled 2019-05-06 (×5): qty 250

## 2019-05-06 MED ORDER — ACETAMINOPHEN 325 MG PO TABS: 650.0000 mg | ORAL_TABLET | ORAL | Status: DC | PRN

## 2019-05-06 MED ORDER — HYDROCORTISONE NA SUCCINATE PF 100 MG IJ SOLR
50.0000 mg | Freq: Four times a day (QID) | INTRAMUSCULAR | Status: DC
  Administered 2019-05-06 – 2019-05-08 (×7): 50 mg via INTRAVENOUS
  Filled 2019-05-06 (×7): qty 2

## 2019-05-06 MED ORDER — FENTANYL CITRATE (PF) 100 MCG/2ML IJ SOLN
INTRAMUSCULAR | Status: AC
  Administered 2019-05-06: 100 ug via INTRAVENOUS
  Filled 2019-05-06: qty 2

## 2019-05-06 MED ORDER — PROPOFOL 1000 MG/100ML IV EMUL
INTRAVENOUS | Status: AC
  Administered 2019-05-06: 13:00:00 5 ug/kg/min via INTRAVENOUS
  Filled 2019-05-06: qty 100

## 2019-05-06 MED ORDER — PIPERACILLIN-TAZOBACTAM 3.375 G IVPB
3.3750 g | Freq: Three times a day (TID) | INTRAVENOUS | Status: DC
  Administered 2019-05-07 (×3): 3.375 g via INTRAVENOUS
  Filled 2019-05-06 (×4): qty 50

## 2019-05-06 MED ORDER — PROPOFOL 1000 MG/100ML IV EMUL
0.0000 ug/kg/min | INTRAVENOUS | Status: DC
  Administered 2019-05-06: 50 ug/kg/min via INTRAVENOUS

## 2019-05-06 MED ORDER — HEPARIN (PORCINE) 25000 UT/250ML-% IV SOLN
1800.0000 [IU]/h | INTRAVENOUS | Status: DC
  Administered 2019-05-06: 1800 [IU]/h via INTRAVENOUS
  Administered 2019-05-06: 1900 [IU]/h via INTRAVENOUS
  Filled 2019-05-06 (×2): qty 250

## 2019-05-06 MED ORDER — AMIODARONE HCL IN DEXTROSE 360-4.14 MG/200ML-% IV SOLN: 30.0000 mg/h | INTRAVENOUS | Status: DC

## 2019-05-06 MED ORDER — NOREPINEPHRINE 4 MG/250ML-% IV SOLN
0.0000 ug/min | INTRAVENOUS | Status: DC
  Administered 2019-05-06: 15 ug/min via INTRAVENOUS

## 2019-05-06 MED ORDER — ATROPINE SULFATE 1 MG/ML IJ SOLN
INTRAMUSCULAR | Status: AC | PRN
  Administered 2019-05-06: 1 mg via INTRAVENOUS

## 2019-05-06 MED ORDER — PROPOFOL 1000 MG/100ML IV EMUL
5.0000 ug/kg/min | INTRAVENOUS | Status: DC
  Administered 2019-05-06: 45 ug/kg/min via INTRAVENOUS
  Administered 2019-05-06: 13:00:00 25 ug/kg/min via INTRAVENOUS
  Administered 2019-05-06: 13:00:00 5 ug/kg/min via INTRAVENOUS
  Administered 2019-05-06: 13:00:00 35 ug/kg/min via INTRAVENOUS
  Administered 2019-05-06: 13:00:00 15 ug/kg/min via INTRAVENOUS

## 2019-05-06 MED ORDER — ASPIRIN 300 MG RE SUPP
300.0000 mg | RECTAL | Status: AC
  Administered 2019-05-06: 300 mg via RECTAL
  Filled 2019-05-06: qty 1

## 2019-05-06 MED ORDER — MIDAZOLAM HCL 2 MG/2ML IJ SOLN
INTRAMUSCULAR | Status: AC
  Administered 2019-05-06: 2 mg via INTRAVENOUS
  Filled 2019-05-06: qty 2

## 2019-05-06 MED ORDER — SODIUM CHLORIDE 0.9 % IV SOLN
INTRAVENOUS | Status: DC
  Filled 2019-05-06 (×52): qty 150

## 2019-05-06 MED ORDER — SODIUM CHLORIDE 0.9 % IV SOLN
INTRAVENOUS | Status: AC | PRN
  Administered 2019-05-06: 1000 mL via INTRAVENOUS

## 2019-05-06 MED ORDER — AMIODARONE IV BOLUS ONLY 150 MG/100ML
INTRAVENOUS | Status: AC
  Filled 2019-05-06: qty 100

## 2019-05-06 MED ORDER — MIDAZOLAM HCL 2 MG/2ML IJ SOLN
2.0000 mg | Freq: Once | INTRAMUSCULAR | Status: AC
  Administered 2019-05-06: 13:00:00 2 mg via INTRAVENOUS

## 2019-05-06 MED ORDER — SODIUM CHLORIDE 0.9 % IV SOLN
Freq: Once | INTRAVENOUS | Status: AC
  Administered 2019-05-06: 21:00:00 via INTRAVENOUS
  Filled 2019-05-06: qty 150

## 2019-05-06 MED ORDER — AMIODARONE HCL IN DEXTROSE 360-4.14 MG/200ML-% IV SOLN
INTRAVENOUS | Status: AC
  Administered 2019-05-06: 12:00:00 60 mg/h via INTRAVENOUS
  Filled 2019-05-06: qty 200

## 2019-05-06 MED ORDER — ALBUTEROL SULFATE (2.5 MG/3ML) 0.083% IN NEBU: 2.5000 mg | INHALATION_SOLUTION | RESPIRATORY_TRACT | Status: DC | PRN

## 2019-05-06 MED ORDER — SODIUM CHLORIDE 0.9 % IV SOLN
2000.0000 mg | Freq: Once | INTRAVENOUS | Status: DC
  Filled 2019-05-06: qty 20

## 2019-05-06 MED ORDER — SODIUM CHLORIDE 0.9 % IV SOLN
250.0000 mL | INTRAVENOUS | Status: DC | PRN
  Administered 2019-05-19 – 2019-05-27 (×7): 250 mL via INTRAVENOUS

## 2019-05-06 MED ORDER — FENTANYL BOLUS VIA INFUSION
25.0000 ug | INTRAVENOUS | Status: DC | PRN
  Filled 2019-05-06: qty 25

## 2019-05-06 MED ORDER — MIDAZOLAM HCL 2 MG/2ML IJ SOLN: 2.0000 mg | INTRAMUSCULAR | Status: DC | PRN

## 2019-05-06 MED ORDER — ETOMIDATE 2 MG/ML IV SOLN
INTRAVENOUS | Status: AC | PRN
  Administered 2019-05-06: 20 mg via INTRAVENOUS

## 2019-05-06 MED ORDER — AMIODARONE HCL IN DEXTROSE 360-4.14 MG/200ML-% IV SOLN
60.0000 mg/h | INTRAVENOUS | Status: DC
  Administered 2019-05-06: 12:00:00 60 mg/h via INTRAVENOUS

## 2019-05-06 MED ORDER — AMIODARONE LOAD VIA INFUSION
150.0000 mg | Freq: Once | INTRAVENOUS | Status: DC
  Filled 2019-05-06: qty 83.34

## 2019-05-06 MED ORDER — EPINEPHRINE 1 MG/10ML IJ SOSY
PREFILLED_SYRINGE | INTRAMUSCULAR | Status: AC | PRN
  Administered 2019-05-06: 1 mg via INTRAVENOUS

## 2019-05-06 MED ORDER — POTASSIUM CHLORIDE 10 MEQ/100ML IV SOLN: 10.0000 meq | INTRAVENOUS | Status: DC

## 2019-05-06 MED ORDER — FENTANYL CITRATE (PF) 100 MCG/2ML IJ SOLN: 25.0000 ug | Freq: Once | INTRAMUSCULAR | Status: DC

## 2019-05-06 MED ORDER — MAGNESIUM SULFATE 4 GM/100ML IV SOLN
4.0000 g | Freq: Once | INTRAVENOUS | Status: AC
  Administered 2019-05-06: 4 g via INTRAVENOUS
  Filled 2019-05-06: qty 100

## 2019-05-06 MED ORDER — CHLORHEXIDINE GLUCONATE 0.12% ORAL RINSE (MEDLINE KIT)
15.0000 mL | Freq: Two times a day (BID) | OROMUCOSAL | Status: DC
  Administered 2019-05-06 – 2019-05-07 (×2): 15 mL via OROMUCOSAL

## 2019-05-06 MED ORDER — CHLORHEXIDINE GLUCONATE CLOTH 2 % EX PADS
6.0000 | MEDICATED_PAD | Freq: Every day | CUTANEOUS | Status: DC
  Administered 2019-05-06 – 2019-06-05 (×27): 6 via TOPICAL

## 2019-05-06 MED ORDER — PROPOFOL 1000 MG/100ML IV EMUL
INTRAVENOUS | Status: AC
  Filled 2019-05-06: qty 100

## 2019-05-06 MED ORDER — AMIODARONE HCL IN DEXTROSE 360-4.14 MG/200ML-% IV SOLN
INTRAVENOUS | Status: AC
  Filled 2019-05-06: qty 200

## 2019-05-06 MED ORDER — ASPIRIN 81 MG PO CHEW: 324.0000 mg | CHEWABLE_TABLET | ORAL | Status: AC

## 2019-05-06 MED ORDER — DEXTROSE 5 % IV SOLN
INTRAVENOUS | Status: AC | PRN
  Administered 2019-05-06: 300 mg via INTRAVENOUS

## 2019-05-06 MED ORDER — ORAL CARE MOUTH RINSE
15.0000 mL | OROMUCOSAL | Status: DC
  Administered 2019-05-06 – 2019-05-07 (×7): 15 mL via OROMUCOSAL

## 2019-05-06 MED ORDER — MAGNESIUM SULFATE 50 % IJ SOLN
INTRAMUSCULAR | Status: AC | PRN
  Administered 2019-05-06: 2 g via INTRAVENOUS

## 2019-05-06 MED ORDER — PIPERACILLIN-TAZOBACTAM 3.375 G IVPB 30 MIN
3.3750 g | Freq: Once | INTRAVENOUS | Status: AC
  Administered 2019-05-06: 3.375 g via INTRAVENOUS
  Filled 2019-05-06: qty 50

## 2019-05-06 MED ORDER — CALCIUM GLUCONATE-NACL 2-0.675 GM/100ML-% IV SOLN
2.0000 g | Freq: Once | INTRAVENOUS | Status: AC
  Administered 2019-05-06: 2000 mg via INTRAVENOUS
  Filled 2019-05-06: qty 100

## 2019-05-06 MED ORDER — HEPARIN BOLUS VIA INFUSION
1400.0000 [IU] | Freq: Once | INTRAVENOUS | Status: DC
  Filled 2019-05-06: qty 1400

## 2019-05-06 MED ORDER — HEPARIN BOLUS VIA INFUSION
3000.0000 [IU] | Freq: Once | INTRAVENOUS | Status: AC
  Administered 2019-05-06: 08:00:00 3000 [IU] via INTRAVENOUS
  Filled 2019-05-06: qty 3000

## 2019-05-06 MED ORDER — SODIUM CHLORIDE 0.9 % IV SOLN
INTRAVENOUS | Status: DC
  Administered 2019-05-06 – 2019-05-08 (×4): via INTRAVENOUS

## 2019-05-06 MED ORDER — POTASSIUM CHLORIDE 10 MEQ/50ML IV SOLN
10.0000 meq | INTRAVENOUS | Status: AC
  Administered 2019-05-06 – 2019-05-07 (×4): 10 meq via INTRAVENOUS
  Filled 2019-05-06 (×4): qty 50

## 2019-05-06 MED ORDER — DEXMEDETOMIDINE HCL IN NACL 400 MCG/100ML IV SOLN
0.4000 ug/kg/h | INTRAVENOUS | Status: DC
  Administered 2019-05-06: 0.6 ug/kg/h via INTRAVENOUS
  Administered 2019-05-06: 0.8 ug/kg/h via INTRAVENOUS
  Administered 2019-05-07: 0.6 ug/kg/h via INTRAVENOUS
  Filled 2019-05-06 (×3): qty 100

## 2019-05-06 MED ORDER — LEVETIRACETAM IN NACL 1000 MG/100ML IV SOLN
1000.0000 mg | Freq: Two times a day (BID) | INTRAVENOUS | Status: DC
  Administered 2019-05-07: 1000 mg via INTRAVENOUS
  Filled 2019-05-06 (×2): qty 100

## 2019-05-06 MED ORDER — VANCOMYCIN HCL 10 G IV SOLR
1750.0000 mg | INTRAVENOUS | Status: DC
  Filled 2019-05-06: qty 1750

## 2019-05-06 MED ORDER — FENTANYL 2500MCG IN NS 250ML (10MCG/ML) PREMIX INFUSION: 25.0000 ug/h | INTRAVENOUS | Status: DC

## 2019-05-06 MED ORDER — SODIUM CHLORIDE 0.9 % IV SOLN: 1.0000 g | Freq: Once | INTRAVENOUS | Status: DC

## 2019-05-06 MED ORDER — PANTOPRAZOLE SODIUM 40 MG PO PACK
40.0000 mg | PACK | Freq: Every day | ORAL | Status: DC
  Administered 2019-05-06 – 2019-05-08 (×3): 40 mg
  Filled 2019-05-06 (×3): qty 20

## 2019-05-06 MED ORDER — SODIUM CHLORIDE 0.9% FLUSH
3.0000 mL | Freq: Two times a day (BID) | INTRAVENOUS | Status: DC
  Administered 2019-05-06 – 2019-05-26 (×35): 3 mL via INTRAVENOUS

## 2019-05-06 MED ORDER — FENTANYL CITRATE (PF) 100 MCG/2ML IJ SOLN
100.0000 ug | Freq: Once | INTRAMUSCULAR | Status: AC
  Administered 2019-05-06: 19:00:00 100 ug via INTRAVENOUS

## 2019-05-06 MED ORDER — SUCCINYLCHOLINE CHLORIDE 20 MG/ML IJ SOLN
INTRAMUSCULAR | Status: AC | PRN
  Administered 2019-05-06: 100 mg via INTRAVENOUS

## 2019-05-06 MED ORDER — CALCIUM GLUCONATE-NACL 1-0.675 GM/50ML-% IV SOLN
1.0000 g | Freq: Once | INTRAVENOUS | Status: AC
  Administered 2019-05-06: 1000 mg via INTRAVENOUS
  Filled 2019-05-06: qty 50

## 2019-05-06 MED ORDER — SODIUM BICARBONATE 8.4 % IV SOLN
50.0000 meq | Freq: Once | INTRAVENOUS | Status: AC
  Administered 2019-05-06: 50 meq via INTRAVENOUS

## 2019-05-06 MED ORDER — MIDAZOLAM HCL 2 MG/2ML IJ SOLN
2.0000 mg | Freq: Once | INTRAMUSCULAR | Status: AC
  Administered 2019-05-06: 12:00:00 2 mg via INTRAVENOUS

## 2019-05-06 MED ORDER — FENTANYL 2500MCG IN NS 250ML (10MCG/ML) PREMIX INFUSION
0.0000 ug/h | INTRAVENOUS | Status: DC
  Administered 2019-05-06: 100 ug/h via INTRAVENOUS
  Filled 2019-05-06: qty 250

## 2019-05-06 NOTE — Code Documentation (Signed)
This RN and English as a second language teacher heard patient shouting, went into room and patient appeared purple in color and was flailing.  Dr. Lenise Arena came into room and assessed patient.  Patient was responding at that time.

## 2019-05-06 NOTE — Consult Note (Addendum)
NEURO HOSPITALIST CONSULT NOTE   Requestig physician: Dr. Nelda Marseille  Reason for Consult: Seizures  History obtained from:  Chart    HPI:                                                                                                                                          Dylan Goodman is an 68 y.o. male with recent admission for Covid pneumonia who re-presented at [facility] with seizure and had subsequent brief cardiac arrest there. He was intubated and transferred to [facility] for ICU management. He has a history of CHF, carotid atherosclerotic disease s/p CEA, PVD, atrial fibrillation (on Eliquis), CKD4, DM2 and chronic diarrhea.   Regarding his seizures, after discharge from his initial admission for management of Covid PNA, he had an episode of dizziness on 10/6, called EMS and on their arrival, had a witnessed GTC seizure which stopped with 2 mg of Versed. He was sent to [facility] where he exhibited postictal confusion. He then developed respiratory distress on 05-25-23 then coded with 11 minute downtime. He was then intubated and transferred to [facility].   He was loaded with Keppra 2000 mg in the ICU and is now on scheduled Keppra at 1000 mg BID. He has had no further seizures while here.   EEG in the ICU shows continuous generalized slowing and excessive beta activity. The EEG study is suggestive of moderate diffuse encephalopathy. No seizures or epileptiform discharges were seen throughout the recording. The excessive beta activity seen in the background is most likely due to the effect of benzodiazepine and is a benign EEG pattern.  Past Medical History:  Diagnosis Date  . Atrial fibrillation ([place])   . Bleeding gastrointestinal   . CAD (coronary artery disease)    Status post CABG and PCI  . 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 Cardiomyopathy ([place])   . Chronic depression   . CKD (chronic kidney disease)   . Diabetes mellitus  ([place])   . Gastric ulcer with hemorrhage   . Gastritis   . GERD (gastroesophageal reflux disease)   . Hx of CABG 12/1994  . IDA (iron deficiency anemia)   . 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 Myocardial infarction ([place]) 2017  . Personal history of radiation therapy 05/30/2018   39 treatments with chemotherapy nasopharyngeal cancer  . 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 CAROTID ENDARTERECTOMY  2006  . COLONOSCOPY WITH PROPOFOL N/A 03/11/2019   Procedure: COLONOSCOPY WITH PROPOFOL;  Surgeon: Toledo, Benay Pike, MD;  Location: [facility] ENDOSCOPY;  Service: Gastroenterology;  Laterality: N/A;  . CORONARY ARTERY BYPASS GRAFT  1996   quadruple bypass  . ESOPHAGOGASTRODUODENOSCOPY (EGD) WITH  PROPOFOL N/A 03/11/2019   Procedure: ESOPHAGOGASTRODUODENOSCOPY (EGD) WITH PROPOFOL;  Surgeon: Toledo, Benay Pike, MD;  Location: [facility] ENDOSCOPY;  Service: Gastroenterology;  Laterality: N/A;  . FEMORAL-POPLITEAL BYPASS GRAFT Right 06/1998   11/2001, 08/2003  . 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 LOWER EXTREMITY ANGIOGRAPHY Left 11/11/2018   Procedure: LOWER EXTREMITY ANGIOGRAPHY;  Surgeon: Katha Cabal, MD;  Location: [place] [facility];  Service: Cardiovascular;  Laterality: Left;  . REVISION OF AORTA BIFEMORAL BYPASS    . Stent revascularization left leg  08/2009  . Stent revascularization right leg  07/2009    Family History  Problem Relation Age of Onset  . Coronary artery disease Mother   . Alcohol abuse Father   . Colon cancer Father   . Coronary artery disease Father   . Kidney cancer Father   . Breast cancer Sister               Social History:  reports that he quit smoking about 2 years ago. His smoking use included cigarettes. He has a 75.00 pack-year smoking history. He has never used smokeless tobacco. He reports previous alcohol use. He reports that he does not use drugs.  No Known Allergies  MEDICATIONS:                                                                                                                      Scheduled: . amiodarone  150 mg Intravenous Once  . chlorhexidine gluconate (MEDLINE KIT)  15 mL Mouth Rinse BID  . Chlorhexidine Gluconate Cloth  6 each Topical Daily  . fentaNYL (SUBLIMAZE) injection  25 mcg Intravenous Once  . hydrocortisone sodium succinate  50 mg Intravenous Q6H  . mouth rinse  15 mL Mouth Rinse 10 times per day  . pantoprazole sodium  40 mg Per Tube Daily  . sodium chloride flush  3 mL Intravenous Q12H   Continuous: . sodium chloride    . sodium chloride 100 mL/hr at 05/07/19 0500  . dexmedetomidine (PRECEDEX) IV infusion 1 mcg/kg/hr (05/07/19 0500)  . fentaNYL infusion INTRAVENOUS 175 mcg/hr (05/07/19 0500)  . heparin 1,600 Units/hr (05/07/19 0531)  . levETIRAcetam    . norepinephrine (LEVOPHED) Adult infusion 7 mcg/min (05/07/19 0500)  . piperacillin-tazobactam (ZOSYN)  IV 12.5 mL/hr at 05/07/19 0500  . vancomycin       ROS:  Unable to obtain due to AMS.    Blood pressure 107/73, pulse 86, temperature 98.2 F (36.8 C), temperature source Oral, resp. rate (!) 22, height '5\' 11"'  (1.803 m), weight 92.1 kg, SpO2 99 %.   General Examination:                                                                                                       Physical Exam  HEENT-  Normocephalic. Old scar with underlying mild skull indentation to left frontal scalp.    Lungs- Intubated Extremities- No edema  Neurological Examination Mental Status: Intubated. Somnolent in the context of Precedex and fentanyl sedation. Will open eyes to noxious and count fingers then rapidly falls asleep. Will grip examiner's hand bilaterally to verbal command.  Cranial Nerves: II: Does not blink to threat in right or left visual fields in the context  of sedation, but can count fingers. PERRL. III,IV, VI: Does not track visually but did fixate briefly on examiner's face and hand. Eyes mildly exotropic. No nystagmus.  V,VII: Face is flaccidly symmetric at rest but contractions were equal when mouthing the word "five". Reacts to tactile stimulation bilaterally VIII: hearing intact to voice IX,X: Intubated XI: Head is positioned at the midline XII: Unable to assess Motor: Will lift BUE antigravity, resist in flexion with 4-/5 strength and grip examiner's hand bilaterally, without asymmetry. Will extend at knees bilaterally with 4-/5 strength. Can flex at hip but does overcome gravity in the context of sedation. Wiggles feet equally.  Sensory: Reacts to tactile stimulation BUE and BLE.  Deep Tendon Reflexes: 1+ bilateral brachioradialis and patellae.  Plantars: Upgoing bilaterally  Cerebellar: Unable to follow more complex commands such as FNF in the context of sedation.  Gait: Unable to assess   Lab Results: Basic Metabolic Panel: Recent Labs  Lab 05/04/19 1002 05/04/19 1502 05/05/19 1832 05/06/19 1205 05/06/19 1725  NA 137 138 138 141 140  K 4.2 3.9 3.8 4.1 3.8  CL 109 110 105 112*  --   CO2 18* 19* 11* 10*  --   GLUCOSE 128* 128* 198* 246*  --   BUN 51* 47* 44* 39*  --   CREATININE 2.85* 2.58* 3.06* 2.83*  --   CALCIUM 6.8* 6.5* 7.1* 6.3*  --   MG  --   --   --  1.9  --     CBC: Recent Labs  Lab 05/04/19 1002 05/05/19 1832 05/06/19 1205 05/06/19 1725  WBC 6.0 8.2 4.7  --   NEUTROABS  --   --  1.5*  --   HGB 12.9* 12.5* 11.5* 12.2*  HCT 41.0 41.1 40.4 36.0*  MCV 91.3 93.8 100.2*  --   PLT 298 353 244  --     Cardiac Enzymes: No results for input(s): CKTOTAL, CKMB, CKMBINDEX, TROPONINI in the last 168 hours.  Lipid Panel: No results for input(s): CHOL, TRIG, HDL, CHOLHDL, VLDL, LDLCALC in the last 168 hours.  Imaging: Dg Abdomen 1 View  Result Date: 05/06/2019 CLINICAL DATA:  Nasogastric tube placement.  EXAM: ABDOMEN - 1 VIEW  COMPARISON:  None. FINDINGS: Nasogastric tube terminates just beyond the gastroesophageal junction with the side port in the lower esophagus. IMPRESSION: Nasogastric tube terminates just beyond the gastroesophageal junction, with the side port in the distal esophagus. Advancing approximately 10-15 cm would better position the tip within the stomach. Electronically Signed   By: Lorin Picket M.D.   On: 05/06/2019 13:41   Ct Head Wo Contrast  Result Date: 05/05/2019 CLINICAL DATA:  Seizure, covid positive patient EXAM: CT HEAD WITHOUT CONTRAST TECHNIQUE: Contiguous axial images were obtained from the base of the skull through the vertex without intravenous contrast. COMPARISON:  April 12, 2019 FINDINGS: Brain: No evidence of acute territorial infarction, hemorrhage, hydrocephalus,extra-axial collection or mass lesion/mass effect. Normal gray-white differentiation. There is mild dilatation the ventricles and sulci consistent with age-related atrophy. Vascular: No hyperdense vessel. Calcifications are seen within the internal carotid artery and bilateral vertebral arteries. Skull: The skull is intact. No fracture or focal lesion identified. Sinuses/Orbits: The visualized paranasal sinuses and mastoid air cells are clear. The orbits and globes intact. Other: None IMPRESSION: No acute intracranial abnormality. Findings consistent with age related atrophy Electronically Signed   By: Prudencio Pair M.D.   On: 05/05/2019 19:30   Dg Chest Portable 1 View  Result Date: 05/06/2019 CLINICAL DATA:  Endotracheal tube placement. EXAM: PORTABLE CHEST 1 VIEW COMPARISON:  05/05/2019 and 05/04/2019. FINDINGS: Endotracheal tube terminates 5.3 cm above the carina. Nasogastric tube is followed into the distal esophagus with the tip projecting beyond the inferior margin of the image. Heart is enlarged, stable. Thoracic aorta is calcified. Apparent worsening in mixed interstitial and airspace opacification  may be due in part to lower lung volumes on the current study. Apparent lucency in the periphery of the upper right hemithorax is unchanged from 05/04/2019 and may be due to bullous emphysema. IMPRESSION: 1. Endotracheal tube is in satisfactory position. 2. Apparent worsening in mixed interstitial and airspace opacification may be due to expiratory phase imaging versus worsening pulmonary edema. Electronically Signed   By: Lorin Picket M.D.   On: 05/06/2019 13:39   Dg Chest Portable 1 View  Result Date: 05/05/2019 CLINICAL DATA:  Shortness of breath, dizziness, and weakness. COVID-19 virus infection. EXAM: PORTABLE CHEST 1 VIEW COMPARISON:  05/04/2019 FINDINGS: Stable mild cardiomegaly. Prior median sternotomy. Underlying chronic interstitial lung disease is again noted. Increased opacity is seen in both lower lobes, suspicious for superimposed pneumonia. No evidence of pleural effusion. IMPRESSION: Increased opacity in both lower lobes, suspicious for pneumonia superimposed on underlying chronic interstitial lung disease. Stable mild cardiomegaly. Electronically Signed   By: Marlaine Hind M.D.   On: 05/05/2019 19:20   CT head: No acute intracranial abnormality. Findings consistent with age related atrophy  Assessment: 68 year old male with GTC seizure at OSH followed by cardiac arrest with 11 minute downtime. Covid positive.  1. Exam reveals findings consistent with intact cortical function. He is unlikely to have suffered significant permanent diffuse anoxic brain injury given the encouraging findings this early after the arrest.  2. No clinical seizure activity on exam. EEG was negative for electrographic seizures. DDx for underlying etiology of the seizure includes cerebral hypoperfusion due to an arrhythmia, which may have manifested just prior to him coding.   Recommendations: 1. Decrease scheduled Keppra to 500 mg BID.  2. Continue to closely monitor.  3. Neurology will sign off. Please call  if there are additional questions.   40 minutes spent in the neurological evaluation and management of this  critically ill patient.   Electronically signed: Dr. Kerney Elbe 05/06/2019, 7:18 PM

## 2019-05-06 NOTE — Progress Notes (Signed)
ANTICOAGULATION CONSULT NOTE - Initial Consult  Pharmacy Consult for Heparin  Indication: pulmonary embolus  No Known Allergies  Patient Measurements: Height: 6' (182.9 cm) Weight: 212 lb (96.2 kg) IBW/kg (Calculated) : 77.6 Heparin Dosing Weight:  96.2 kg   Vital Signs: Temp: 97.4 F (36.3 C) (10/06 1956) Temp Source: Oral (10/06 1956) BP: 124/85 (10/07 0600) Pulse Rate: 108 (10/07 0600)  Labs: Recent Labs    05/04/19 1002 05/04/19 1125 05/04/19 1502 05/05/19 1832 05/05/19 2114 05/06/19 0552  HGB 12.9*  --   --  12.5*  --   --   HCT 41.0  --   --  41.1  --   --   PLT 298  --   --  353  --   --   APTT  --   --   --   --  31 42*  LABPROT  --   --   --   --  17.3*  --   INR  --   --   --   --  1.4*  --   HEPARINUNFRC  --   --   --   --   --  0.61  CREATININE 2.85*  --  2.58* 3.06*  --   --   TROPONINIHS 17 17  --  29* 167*  --     Estimated Creatinine Clearance: 28.9 mL/min (A) (by C-G formula based on SCr of 3.06 mg/dL (H)).   Medical History: Past Medical History:  Diagnosis Date  . Bleeding gastrointestinal 09/03/2018  . 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 Chronic depression 09/03/2018  . CKD (chronic kidney disease) 09/03/2018  . Coronary atherosclerosis 09/03/2018  . Coronary bypass graft mechanical complication [id number]/8768  . Diabetes mellitus without complication ([place])   . Gastric ulcer with hemorrhage 09/03/2018  . Gastritis 09/03/2018  . GERD (gastroesophageal reflux disease) 09/03/2018  . Heart disease   . Heart murmur   . IDA (iron deficiency anemia) 09/03/2018  . Low magnesium levels 09/03/2018  . 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 Myocardial infarction ([place]) 2017  . Personal history of radiation therapy 05/30/2018   39 treatments with chemotherapy nasopharyngeal cancer  . 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  Pt was on apixaban PTA, uncertain when last dose was given.   CrCl = 28.9 ml/min   Goal of Therapy:  APTT = 66 - 102  Heparin level 0.3-0.7 units/ml Monitor platelets by anticoagulation protocol: Yes   Plan:  10/07 @ 0500 aPTT 42 seconds subtherapeutic, HL not correlating. Will bolus w/ heparin 3000 units IV x 1 and increase rate to 1900 units/hr and will recheck aPTT at 1400 w/ HL w/ am labs, CBC stable will continue to monitor.  Tobie Lords, PharmD, BCPS Clinical Pharmacist 05/06/2019,7:40 AM

## 2019-05-06 NOTE — Telephone Encounter (Signed)
-----   Message from Lucilla Lame, MD sent at 05/05/2019  4:53 PM EDT ----- Let the patient know that the fecal fat was normal meaning that he is not having any pancreatic insufficiency.

## 2019-05-06 NOTE — ED Notes (Signed)
Patient given breakfast tray.  Will continue to monitor.

## 2019-05-06 NOTE — H&P (Signed)
NAME:  Dusan Lipford, MRN:  [id number], DOB:  09/27/1950, LOS: 0 ADMISSION DATE:  May 12, 2019, CONSULTATION DATE:  2023-05-12 REFERRING MD:  Dr Jimmye Norman [facility] EDP, CHIEF COMPLAINT:  Cardiac arrest   Brief History   68 year old male recently admitted with COVID PNA 9/13 now presenting with seizure and subsequently suffered brief cardiac arrest in the [facility] ED. Intubated and transferred to [facility].   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 He was recently admitted to [facility] for COVID-19 pneumonia from 9/14 - 9/19 and was treated with remdesivir and dexamethasone. After discharge he had apparently been experiencing lightheadedness. 10/5 he presented to [facility] with complaints of chest pain. Cardiac workup was negative and he was felt to be dehydrated. He was given IVF and discharged home. Then, 2023-05-11, he called EMS due to dizziness, and upon their arrival he had a witnessed 2-minute tonic/clonic type seizure abated with versed 2mg . Upon arrival to ED he was awake, but confused. COVID Test again positive. He was scheduled for transfer to [facility] and was doing well until the early afternoon of May 12, 2023 when he abruptly developed respiratory distress and tachycardia with the appearance of VT vs AF RVR. He lost pulses. He was defibrillated and CPR was started. Total downtime approximated at 11 mins. He was intubated and transferred to [facility].   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 ([place]) (05/2008), Chronic depression (09/03/2018), CKD (chronic kidney disease) (09/03/2018), Coronary atherosclerosis (09/03/2018), Coronary bypass graft mechanical complication (32/2025), Diabetes mellitus without  complication ([place]), Gastric ulcer with hemorrhage (09/03/2018), Gastritis (09/03/2018), GERD (gastroesophageal reflux disease) (09/03/2018), Heart disease, Heart murmur, IDA (iron deficiency anemia) (09/03/2018), Low magnesium levels (09/03/2018), 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  Significant Hospital Events   9/14-9/19 admit to [facility] for COVID pneumonia.  10/5 to ED for Chest pain. Discharged home after negative cardiac. May 11, 2023 To ED for seizure 05/12/23 coded in ED while awaiting placement  Consults:  Neurology  Procedures:  ETT 12-May-2023 >  Significant Diagnostic Tests:  Lower extremity doppler 05-12-23 >  Micro Data:  COVID May 11, 2023 > POSITIVE Blood 05/12/23 >  Urine 05-12-2023 > Trach aspirate May 12, 2023 >  Antimicrobials:  Zosyn 2023/05/12 > Vancomycin May 12, 2023 >  Interim history/subjective:    Objective   Blood pressure 107/73, pulse 86, temperature 98.2 F (36.8 C), temperature source Oral, resp. rate (!) 22, height 5\' 11"  (1.803 m), weight 92.1 kg, SpO2 99 %.    Vent Mode: PRVC FiO2 (%):  [80 %-100 %] 80 % Set Rate:  [16 bmp-18 bmp] 18 bmp Vt Set:  [500 mL-620 mL] 620 mL PEEP:  [5 cmH20-8 cmH20] 8 cmH20  No intake or output data in the 24 hours ending 05-12-2019 1729 Filed Weights   05/12/19 1640  Weight: 92.1 kg    Examination: General: Acute on chronically ill appearing male, sedated and ventilated HENT: [place]/AT, PERRL, EOM-I and MMM, ETT in place Lungs: Coarse diffusely Cardiovascular: IRIR, Nl S1/S2 and -M/R/G Abdomen: Soft, NT, ND and +BS  Extremities: -edema and -tenderness Neuro: Sedate, moving all ext spontaneously but not to command Skin: Thick but intact  Resolved Hospital Problem list   N/A  Assessment & Plan:  68 year old male with diagnosis of COVID 19 3 weeks PTP who presents to [facility] with seizure who was in the ED in [facility] when he reportedly had a seizure followed by a cardiac arrest vs  spontaneous cardiac arrest.  Given persistent positive COVID status patient was not admitted to [facility] and PCCM was called to accept on transfer.  Neurology wanted patient in [facility] for EEG and neurology to evaluate.     Cardiac arrest:             - Heparin drip             - Amiodarone drip             - Cardiology consulted             - Tele monitoring             - EKG             - Echo             - No TTM due to sepsis and anti-coagulation  Seizure:             - Neuro consulted             - Will defer treatment to neurology             - Precedex and fentanyl drip             - Versed PRN for seizure if recurs  Hypotension:             - TLC placement             - Levophed drip             - IVF             - CVP  VDRF:             - Full vent support             - ABG now and in AM             - Adjust vent for ABG             - CXR now and in AM  A-fib:  - Heparin drip  - Hold eliquis  - Cards to see  COVID-19 infection:  - Not an active infection at this point  - No COVID specific treatment  CK:  - IV hydration  - Check CVP  - Attempt to limit pressor use  - BMET in AM  - Replace electrolytes as indicated  Best practice:  Diet: NPO Pain/Anxiety/Delirium protocol (if indicated): Propofol, fentanyl for RASS -2 VAP protocol (if indicated): Per protocol DVT prophylaxis: Heparin infusion GI prophylaxis: PPI Glucose control: SSI Mobility: BR Code Status: FULL Family Communication: No family present.  Disposition: ICU  Labs   CBC: Recent Labs  Lab 05/04/19 1002 05/05/19 1832 05/06/19 1205 05/06/19 1725  WBC 6.0 8.2 4.7  --   NEUTROABS  --   --  1.5*  --   HGB 12.9* 12.5* 11.5* 12.2*  HCT 41.0 41.1 40.4 36.0*  MCV 91.3 93.8 100.2*  --   PLT 298 353 244  --     Basic Metabolic Panel: Recent Labs  Lab 05/04/19 1002 05/04/19 1502 05/05/19 1832 05/06/19 1205 05/06/19 1725  NA 137 138 138 141 140  K 4.2 3.9 3.8 4.1 3.8  CL 109  110  105 112*  --   CO2 18* 19* 11* 10*  --   GLUCOSE 128* 128* 198* 246*  --   BUN 51* 47* 44* 39*  --   CREATININE 2.85* 2.58* 3.06* 2.83*  --   CALCIUM 6.8* 6.5* 7.1* 6.3*  --   MG  --   --   --  1.9  --    GFR: Estimated Creatinine Clearance: 30.2 mL/min (A) (by C-G formula based on SCr of 2.83 mg/dL (H)). Recent Labs  Lab 05/04/19 1002 05/05/19 1832 05/05/19 2114 05/06/19 1205  PROCALCITON  --   --  0.14  --   WBC 6.0 8.2  --  4.7  LATICACIDVEN  --   --  1.2  --     Liver Function Tests: Recent Labs  Lab 05/05/19 1832 05/06/19 1205  AST 44* 628*  ALT 22 329*  ALKPHOS 62 61  BILITOT 0.7 0.6  PROT 6.8 5.3*  ALBUMIN 3.1* 2.4*   No results for input(s): LIPASE, AMYLASE in the last 168 hours. No results for input(s): AMMONIA in the last 168 hours.  ABG    Component Value Date/Time   PHART 7.254 (L) 05/06/2019 1725   PCO2ART 34.6 05/06/2019 1725   PO2ART 139.0 (H) 05/06/2019 1725   HCO3 15.4 (L) 05/06/2019 1725   TCO2 16 (L) 05/06/2019 1725   ACIDBASEDEF 11.0 (H) 05/06/2019 1725   O2SAT 99.0 05/06/2019 1725     Coagulation Profile: Recent Labs  Lab 05/05/19 2114  INR 1.4*    Cardiac Enzymes: No results for input(s): CKTOTAL, CKMB, CKMBINDEX, TROPONINI in the last 168 hours.  HbA1C: Hgb A1c MFr Bld  Date/Time Value Ref Range Status  03/12/2019 07:14 AM 6.7 (H) 4.8 - 5.6 % Final    Comment:    (NOTE) Pre diabetes:          5.7%-6.4% Diabetes:              >6.4% Glycemic control for   <7.0% adults with diabetes   09/08/2018 12:03 PM 6.3 (H) <5.7 % of total Hgb Final    Comment:    For someone without known diabetes, a hemoglobin  A1c value between 5.7% and 6.4% is consistent with prediabetes and should be confirmed with a  follow-up test. . For someone with known diabetes, a value <7% indicates that their diabetes is well controlled. A1c targets should be individualized based on duration of diabetes, age, comorbid conditions, and  other considerations. . This assay result is consistent with an increased risk of diabetes. . Currently, no consensus exists regarding use of hemoglobin A1c for diagnosis of diabetes for children. .     CBG: Recent Labs  Lab 05/06/19 1619  GLUCAP 201*    Review of Systems:   Unable as patient is encephalopathic and intubated.   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 ([place]) (05/2008), Chronic depression (09/03/2018), CKD (chronic kidney disease) (09/03/2018), Coronary atherosclerosis (09/03/2018), Coronary bypass graft mechanical complication (54/0981), Diabetes mellitus without complication ([place]), Gastric ulcer with hemorrhage (09/03/2018), Gastritis (09/03/2018), GERD (gastroesophageal reflux disease) (09/03/2018), Heart disease, Heart murmur, IDA (iron deficiency anemia) (09/03/2018), Low magnesium levels (09/03/2018), 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   Surgical History    Past Surgical History:  Procedure Laterality Date   CAROTID ENDARTERECTOMY  2006   COLONOSCOPY WITH PROPOFOL  N/A 03/11/2019   Procedure: COLONOSCOPY WITH PROPOFOL;  Surgeon: Toledo, Benay Pike, MD;  Location: [facility] ENDOSCOPY;  Service: Gastroenterology;  Laterality: N/A;   CORONARY ARTERY BYPASS GRAFT  1996   quadruple bypass   ESOPHAGOGASTRODUODENOSCOPY (EGD) WITH PROPOFOL N/A 03/11/2019   Procedure: ESOPHAGOGASTRODUODENOSCOPY (EGD) WITH PROPOFOL;  Surgeon: Toledo, Benay Pike, MD;  Location: [facility] ENDOSCOPY;  Service: Gastroenterology;  Laterality: N/A;   heart stent  10/2010   x3   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 His smoking use included cigarettes. He has a 75.00 pack-year smoking history. He has never used smokeless tobacco. He reports previous alcohol use. He reports that he does not use drugs.   Family History   His family history includes Alcohol abuse in his father; Breast cancer in his sister; Colon cancer in his father; Coronary artery disease in his father and mother; Kidney cancer in his father.   Allergies No Known Allergies   Home Medications  Prior to Admission medications   Medication Sig Start Date End Date Taking? Authorizing Provider  apixaban (ELIQUIS) 5 MG TABS tablet Take 1 tablet (5 mg total) by mouth 2 (two) times daily. 04/18/19   Patrecia Pour, MD  budesonide-formoterol (SYMBICORT) 160-4.5 MCG/ACT inhaler Inhale 2 puffs into the lungs 2 (two) times daily. Rinse mouth after use 03/17/19   Laverle Hobby, MD  Calcium Carbonate-Vitamin D3 (CALCIUM 600-D) 600-400 MG-UNIT TABS Take 1 tablet by mouth daily.    [provider]  Cholecalciferol (VITAMIN D3) 25 MCG (1000 UT) CAPS Take by mouth.    [provider]  cilostazol (PLETAL) 100 MG tablet Take 100 mg by mouth 2 (two) times daily.     [provider]  colchicine 0.6 MG tablet Take 0.6 mg by mouth once.    [provider]  diphenoxylate-atropine (LOMOTIL) 2.5-0.025 MG tablet Take 1 tablet by mouth 4 (four) times daily as needed for diarrhea or loose stools.    [provider]  ezetimibe (ZETIA) 10 MG tablet Take 10 mg by mouth daily.  07/07/18   [provider]  fenofibrate 160 MG tablet Take 160 mg by mouth daily.     [provider]  Insulin Degludec (TRESIBA FLEXTOUCH) 200 UNIT/ML SOPN Inject 12  Units into the skin at bedtime.  05/17/18   [provider]  isosorbide mononitrate (IMDUR) 120 MG 24 hr tablet Take 120 mg by mouth daily.    [provider]  lansoprazole (PREVACID) 30 MG capsule Take 30 mg by mouth every morning.     [provider]  losartan (COZAAR) 25 MG tablet Take 25 mg by mouth daily.  08/29/18   [provider]  lovastatin (MEVACOR) 40 MG tablet Take 40 mg by mouth 2 (two) times daily.  07/01/18   [provider]  meclizine (ANTIVERT) 12.5 MG tablet Take 1 tablet (12.5 mg total) by mouth 3 (three) times daily as needed for dizziness. 04/05/19   Nena Polio, MD  metoprolol tartrate (LOPRESSOR)  100 MG tablet Take 1 tablet (100 mg total) by mouth 2 (two) times daily. 04/18/19   Patrecia Pour, MD  nitroGLYCERIN (NITROSTAT) 0.4 MG SL tablet Place 0.4 mg under the tongue every 5 (five) minutes as needed for chest pain.  07/01/18   [provider]  sodium bicarbonate 650 MG tablet Take 1 tablet by mouth daily.  12/31/18   Kolluru, Lurena Nida, MD  ticagrelor (BRILINTA) 60 MG TABS tablet Take 60 mg by mouth 2 (two) times daily.     [provider]     Critical care time: 50 min     Georgann Housekeeper, AGACNP-BC [place] Pager [phone] or [phone]  05/06/2019 6:13 PM  Attending Note:  68 year old male with diagnosis of COVID 19 3 weeks PTP who presents to [facility] with seizure who was in the ED in [facility] when he reportedly had a seizure followed by a cardiac arrest vs spontaneous cardiac arrest.  Given persistent positive COVID status patient was not admitted to [facility] and PCCM was called to accept on transfer.  Neurology wanted patient in [facility] for EEG and neurology to evaluate.  Upon presentation patient was agitated, moving all ext spontaneously with coarse BS diffusely.  I reviewed CXR myself, ETT is in a good position and diffuse infiltrate noted.  Discussed with PCCM-NP.  Cardiac arrest:              - Heparin drip             - Amiodarone drip             - Cardiology consulted             - Tele monitoring             - EKG             - Echo             - No TTM due to sepsis and anti-coagulation  Seizure:             - Neuro consulted             - Will defer treatment to neurology             - Precedex and fentanyl drip             - Versed PRN for seizure if recurs  Hypotension:             - TLC placement             - Levophed drip             - IVF             - CVP  VDRF:             - Full vent support             - ABG now and in AM             - Adjust vent for ABG             - CXR now and in AM  A-fib:  - Heparin drip  - Hold eliquis  - Cards to see  COVID-19 infection:  - Not an active infection at this point  - No COVID specific treatment  CK:  - IV hydration  - Check CVP  - Attempt to limit pressor use  - BMET in AM  -  Replace electrolytes as indicated  PCCM will admit.  The patient is critically ill with multiple organ systems failure and requires high complexity decision making for assessment and support, frequent evaluation and titration of therapies, application of advanced monitoring technologies and extensive interpretation of multiple databases.   Critical Care Time devoted to patient care services described in this note is  45  Minutes. This time reflects time of care of this signee Dr Jennet Maduro. This critical care time does not reflect procedure time, or teaching time or supervisory time of PA/NP/Med student/Med Resident etc but could involve care discussion time.  Rush Farmer, M.D. [facility] Pulmonary/Critical Care Medicine. Pager: [phone]. After hours pager: [phone].

## 2019-05-06 NOTE — ED Notes (Signed)
Pt placed on bedpan

## 2019-05-06 NOTE — Progress Notes (Addendum)
Pharmacy  Note  Dylan Goodman is a 68 y.o. male admitted on 05/06/2019 from [place]. He presented with seizure and then cardiac arrest. He was placed on heparin at [facility] for r/o PE.  He is also noted on apixaban PTA for afib (last dose not know). Heparin infusing at 1900 units/hr.   Pharmacy also to begin zosyn and vancomycin for r/o PNA. He received 2000mg  IV vancomycin on 10/6 around 10pm (he was also on cefepime at [place]) -SCr= 2.83 (baseline appears to be 2-3 but also noted with recent AKI)    Plan: -Continue heparin at 1900 units/hr -Heparin level and aPTT now -Zosyn 3.375gm IV q8h -Vancomycin 1750mg  IV q48hr (next dose 10/8 at 10pm); estimated AUC= 482 (SCr= 2.83) -Will follow renal function, cultures and clinical progress   Height: 5\' 11"  (180.3 cm) Weight: 203 lb 0.7 oz (92.1 kg) IBW/kg (Calculated) : 75.3  Temp (24hrs), Avg:98.8 F (37.1 C), Min:97.4 F (36.3 C), Max:99.5 F (37.5 C)  Recent Labs  Lab 05/04/19 1002 05/04/19 1502 05/05/19 1832 05/05/19 2114 05/06/19 1205  WBC 6.0  --  8.2  --  4.7  CREATININE 2.85* 2.58* 3.06*  --  2.83*  LATICACIDVEN  --   --   --  1.2  --     Estimated Creatinine Clearance: 30.2 mL/min (A) (by C-G formula based on SCr of 2.83 mg/dL (H)).    No Known Allergies  Antimicrobials this admission: 10/7 zosyn 10/6 vanc>> -2gm vanc on 10/6 at ~10pm 10/6-10/7 cefepime  Dose adjustments this admission:   Microbiology results: 10/6 blood x2- ngtd  Thank you for allowing pharmacy to be a part of this patient's care.  Hildred Laser, PharmD Clinical Pharmacist **Pharmacist phone directory can now be found on [email or web address] ([email or web address]).  Listed under [place].

## 2019-05-06 NOTE — Procedures (Signed)
Patient Name: Dylan Goodman  MRN: [id number]  Epilepsy Attending: Lora Havens  Referring Physician/Provider: Dr Jennet Maduro Date: 05/06/2019 Duration: 21.07 mins  Patient history: 68yo COVID + M with seizure and cardiac arrest. EEG to evaluate for seizure  Level of alertness: awake  AEDs during EEG study: propofol, keppra   Technical aspects: This EEG study was done with scalp electrodes positioned according to the 10-20 International system of electrode placement. Electrical activity was acquired at a sampling rate of 500Hz  and reviewed with a high frequency filter of 70Hz  and a low frequency filter of 1Hz . EEG data were recorded continuously and digitally stored.   DESCRIPTION: EEG showed continuous generalized 2-3hz  delta slowing admixed with an excessive amount of 15 to 18 Hz, 2-3 uV beta activity with irregular morphology distributed symmetrically and diffusely. No clear posterior dominant rhythm was seen.  Hyperventilation and photic stimulation were not performed.  ABNORMALITY - Continuous slow, generalized - Excessive fast, generalized  IMPRESSION: This study is suggestive of moderate diffuse encephalopathy. No seizures or epileptiform discharges were seen throughout the recording.   The excessive beta activity seen in the background is most likely due to the effect of benzodiazepine and is a benign EEG pattern.   Aron Needles Barbra Sarks

## 2019-05-06 NOTE — ED Notes (Signed)
Patient states he checks his own blood sugar through a device and it sends it to his smart phone.  He states his blood sugar is currently 123.

## 2019-05-06 NOTE — Code Documentation (Signed)
Patient had a pulse

## 2019-05-06 NOTE — ED Notes (Signed)
Patient's sats dropped to 89% on 2L. Patient's O2 increased to 4L via nasal cannula. O2 sats now at 91-93%.

## 2019-05-06 NOTE — ED Notes (Signed)
Date and time results received: 05/06/19 1322 (use smartphrase ".now" to insert current time)  Test: Troponin Critical Value: 243  Name of Provider Notified: Dr Jimmye Norman  Orders Received? Or Actions Taken?: no action taken at this time

## 2019-05-06 NOTE — Code Documentation (Signed)
This RN and English as a second language teacher at bedside speaking with patient.  Patient suddenly became pale and unresponsive. No pulse found, CPR initiated.

## 2019-05-06 NOTE — Telephone Encounter (Signed)
Pt's wife notified of results. 

## 2019-05-06 NOTE — Progress Notes (Signed)
EEG complete - results pending 

## 2019-05-06 NOTE — Code Documentation (Signed)
Patient defibrillated

## 2019-05-06 NOTE — Progress Notes (Signed)
ANTICOAGULATION CONSULT NOTE - Initial Consult  Pharmacy Consult for Heparin  Indication: pulmonary embolus  No Known Allergies  Patient Measurements: Height: 6' (182.9 cm) Weight: 212 lb (96.2 kg) IBW/kg (Calculated) : 77.6 Heparin Dosing Weight:  96.2 kg   Vital Signs: Temp: 99.5 F (37.5 C) (10/07 1502) BP: 116/74 (10/07 1502) Pulse Rate: 91 (10/07 1502)  Labs: Recent Labs    05/04/19 1002  05/04/19 1502 05/05/19 1832 05/05/19 2114 05/06/19 0552 05/06/19 1205  HGB 12.9*  --   --  12.5*  --   --  11.5*  HCT 41.0  --   --  41.1  --   --  40.4  PLT 298  --   --  353  --   --  244  APTT  --   --   --   --  31 42* 64*  LABPROT  --   --   --   --  17.3*  --   --   INR  --   --   --   --  1.4*  --   --   HEPARINUNFRC  --   --   --   --   --  0.61  --   CREATININE 2.85*  --  2.58* 3.06*  --   --  2.83*  TROPONINIHS 17   < >  --  29* 167*  --  243*   < > = values in this interval not displayed.    Estimated Creatinine Clearance: 31.3 mL/min (A) (by C-G formula based on SCr of 2.83 mg/dL (H)).   Medical History: Past Medical History:  Diagnosis Date  . Bleeding gastrointestinal 09/03/2018  . 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 Chronic depression 09/03/2018  . CKD (chronic kidney disease) 09/03/2018  . Coronary atherosclerosis 09/03/2018  . Coronary bypass graft mechanical complication 35/5974  . Diabetes mellitus without complication ([place])   . Gastric ulcer with hemorrhage 09/03/2018  . Gastritis 09/03/2018  . GERD (gastroesophageal reflux disease) 09/03/2018  . Heart disease   . Heart murmur   . IDA (iron deficiency anemia) 09/03/2018  . Low magnesium levels 09/03/2018  . 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 Myocardial infarction ([place]) 2017  . Personal history of radiation therapy 05/30/2018   39 treatments with chemotherapy nasopharyngeal cancer  . 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  Pt was on apixaban PTA, uncertain when last dose was given.   CrCl = 28.9 ml/min. Heparin level was elevated at 0.61 but aPTT was 42, will use aPTT to monitor heparin.   10/7 1205 aPTT 64   Goal of Therapy:  APTT = 66 - 102  Heparin level 0.3-0.7 units/ml once heparin level and aPTT correlate.  Monitor platelets by anticoagulation protocol: Yes   Plan:  APTT is subtherapeutic, HL not correlating. Will bolus w/ heparin 1400 units IV x 1 and increase rate to 2050 units/hr and will recheck aPTT at 2000 w/ HL/CBC w/ am labs, CBC stable will continue to monitor.  Eleonore Chiquito, PharmD, BCPS Clinical Pharmacist 05/06/2019,3:15 PM

## 2019-05-06 NOTE — Procedures (Signed)
Central Venous Catheter Insertion Procedure Note Dylan Goodman [id number] 07/30/1951  Procedure: Insertion of Central Venous Catheter Indications: Assessment of intravascular volume, Drug and/or fluid administration and Frequent blood sampling  Procedure Details Consent: Risks of procedure as well as the alternatives and risks of each were explained to the (patient/caregiver).  Consent for procedure obtained. Time Out: Verified patient identification, verified procedure, site/side was marked, verified correct patient position, special equipment/implants available, medications/allergies/relevent history reviewed, required imaging and test results available.  Performed  Maximum sterile technique was used including antiseptics, cap, gloves, gown, hand hygiene, mask and sheet. Skin prep: Chlorhexidine; local anesthetic administered A antimicrobial bonded/coated triple lumen catheter was placed in the right internal jugular vein using the Seldinger technique.  Evaluation Blood flow good Complications: No apparent complications Patient did tolerate procedure well. Chest X-ray ordered to verify placement.  CXR: pending.   Georgann Housekeeper, AGACNP-BC [place] Pager [phone] or [phone]  05/06/2019 7:17 PM

## 2019-05-06 NOTE — ED Provider Notes (Addendum)
I was called to the bedside for respiratory distress.  Patient was tachycardic which initially appeared to be V. tach and subsequently appeared to spontaneously changed to rapid atrial fibrillation followed by ventricular fibrillation.  We defibrillated the patient with a biphasic shock while he was receiving bag-valve-mask ventilation.  He had been receiving chest compressions as well.  Patient subsequently converted to a rapid atrial fibrillation which we attempted to cardiovert using 100 J.  Patient was altered during this time with intermittent return of spontaneous circulation.  We gave amiodarone and magnesium boluses IV.  INTUBATION Performed by: Laurence Aly  Required items: required blood products, implants, devices, and special equipment available Patient identity confirmed: provided demographic data and hospital-assigned identification number Time out: Immediately prior to procedure a "time out" was called to verify the correct patient, procedure, equipment, support staff and site/side marked as required.  Indications: Cardiac arrest  Intubation method: Glidescope Laryngoscopy   Preoxygenation: 100% BVM  Sedatives: 20 mg etomidate Paralytic: 100 mg succinylcholine  Tube Size: 7.5 cuffed  Post-procedure assessment: chest rise and ETCO2 monitor Breath sounds: equal and absent over the epigastrium Tube secured with: ETT holder Chest x-ray interpreted by radiologist and me.  Chest x-ray findings: endotracheal tube in appropriate position  Patient tolerated the procedure well with no immediate complications.  NG tube also placed with direct visualization using the glide scope after intubation.   CRITICAL CARE Performed by: Laurence Aly   Total critical care time: 30 minutes  Critical care time was exclusive of separately billable procedures and treating other patients.  Critical care was necessary to treat or prevent imminent or life-threatening  deterioration.  Critical care was time spent personally by me on the following activities: development of treatment plan with patient and/or surrogate as well as nursing, discussions with consultants, evaluation of patient's response to treatment, examination of patient, obtaining history from patient or surrogate, ordering and performing treatments and interventions, ordering and review of laboratory studies, ordering and review of radiographic studies, pulse oximetry and re-evaluation of patient's condition.   After an extended period, patient was accepted in transfer to [facility] ICU.   Earleen Newport, MD 05/06/19 1209    Earleen Newport, MD 05/06/19 1257

## 2019-05-06 NOTE — Consult Note (Signed)
Cardiology Consultation:   Patient ID: Dylan Goodman; [id number]; 1950/08/03   Admit date: 05/06/2019 Date of Consult: 05/06/2019  Primary Care Provider: Steele Sizer, MD Primary Cardiologist: Dylan Skains, MD   Patient Profile:   Dylan Goodman is a 68 y.o. male with a history of CAD status post CABG, cardiomyopathy with LVEF 35 to 40% by echocardiogram in August, PAD status post previous revascularization surgery, atrial fibrillation, type II diabetes mellitus, CKD stage IV, and admission for COVID-19 pneumonia from September 14 through September 19 at [facility] who is being seen today for the evaluation of recent cardiac arrest at the request of Dr. Nelda Goodman.  History of Present Illness:   Dylan Goodman recently presented to [facility] with lightheadedness and recent onset chest discomfort based on record review.  Initial ER work-up was negative for ACS and he was felt to be dehydrated, received IV fluids and discharged.  He presented the following day with apparent seizure activity.  Follow-up COVID-19 test was positive.  Plan was for him to transfer to [facility] for further EEG monitoring and management of his COVID-19.  Patient on notes from earlier in the morning at [facility], patient was more alert, received a breakfast tray, subsequently use the bedpan.  Reportedly patient was heard shouting out by nursing staff thereafter and was found to be cyanotic and flailing about in his room.  He was reported to become pale and unresponsive resulting in ACLS protocol and CPR.  There are no telemetry strips for review in the chart.  Dr. Jimmye Goodman ER provider indicates that the patient appeared to be in ventricular tachycardia and subsequently rapid atrial fibrillation followed by ventricular fibrillation.  He was cardioverted and also treated with amiodarone and magnesium.  Airway was stabilized and he was intubated.  Reported "downtime" approximately 11 minutes.  He was later transferred to  [facility] ICU this afternoon.  At the present time he is on respiratory isolation.  Central venous access was being obtained while I was on the unit.  He is on IV amiodarone, IV Levophed, and IV heparin.  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   Myocardial infarction [facility]) 2017   Personal history of radiation therapy 05/30/2018   39 treatments with chemotherapy nasopharyngeal cancer   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    Past Surgical History:  Procedure Laterality Date   CAROTID ENDARTERECTOMY  2006   COLONOSCOPY WITH PROPOFOL N/A 03/11/2019   Procedure: COLONOSCOPY WITH PROPOFOL;  Surgeon: Dylan Goodman, Dylan Pike, MD;  Location: [facility] ENDOSCOPY;  Service: Gastroenterology;  Laterality: N/A;   CORONARY ARTERY BYPASS GRAFT  1996   quadruple bypass   ESOPHAGOGASTRODUODENOSCOPY (EGD) WITH PROPOFOL N/A 03/11/2019   Procedure: ESOPHAGOGASTRODUODENOSCOPY (EGD) WITH PROPOFOL;  Surgeon: Dylan Goodman, Dylan Pike, MD;  Location: [facility] ENDOSCOPY;  Service: Gastroenterology;  Laterality: N/A;   FEMORAL-POPLITEAL BYPASS GRAFT Right 06/1998   11/2001, 08/2003   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   11  coils   LOWER EXTREMITY ANGIOGRAPHY Left 11/11/2018   Procedure: LOWER EXTREMITY ANGIOGRAPHY;  Surgeon: Dylan Cabal, MD;  Location: [place] CV LAB;  Service: Cardiovascular;  Laterality: Left;   REVISION OF AORTA BIFEMORAL BYPASS     Stent revascularization left leg  08/2009   Stent revascularization right leg  07/2009       Inpatient Medications: Scheduled Meds:  amiodarone  150 mg Intravenous Once   aspirin  324 mg Oral NOW   Or   aspirin  300 mg Rectal NOW   Chlorhexidine Gluconate Cloth  6 each Topical Daily   fentaNYL (SUBLIMAZE) injection  25 mcg Intravenous Once   hydrocortisone sodium succinate  50 mg Intravenous Q6H   pantoprazole sodium  40 mg Per Tube Daily   sodium chloride flush  3 mL Intravenous Q12H   Continuous Infusions:  sodium chloride     sodium chloride     dexmedetomidine (PRECEDEX) IV infusion     fentaNYL infusion INTRAVENOUS     heparin     norepinephrine (LEVOPHED) Adult infusion 20 mcg/min (05/06/19 1800)   piperacillin-tazobactam     [START ON 05/07/2019] piperacillin-tazobactam (ZOSYN)  IV     PRN Meds: sodium chloride, acetaminophen, albuterol, fentaNYL, midazolam, sodium chloride flush  Allergies:   No Known Allergies  Social History:   Social History   Socioeconomic History   Marital status: Married    Spouse name: Not on file   Number of children: 1   Years of education: Not on file   Highest education level: 11th grade  Occupational History   Occupation: retired   Scientist, 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 at [employer]    Worry: Never true    Inability: Never true   Transportation needs    Medical: No    Non-medical: No  Tobacco Use   Smoking status: Former Smoker    Packs/day: 1.50    Years: 50.00    Pack years: 75.00    Types: Cigarettes    Quit date: 02/18/2017    Years since quitting: 2.2   Smokeless tobacco: Never Used  Substance and Sexual Activity   Alcohol use: Not Currently   Drug use: Never   Sexual activity: Yes    Partners: Female  Lifestyle   Physical activity    Days per week: 0 days    Minutes per session: 0 min   Stress: Not at all  Relationships   Social connections    Talks on phone: Twice a week    Gets together: Once a week    Attends religious service: Never     Active member of club or organization: No    Attends meetings of clubs or organizations: Never    Relationship status: Married   Intimate partner violence    Fear of current or ex partner: No    Emotionally abused: No    Physically abused: No    Forced sexual activity: No  Other Topics Concern   Not on file  Social History Narrative   Moved here from [place], in 2019, re-married 03/2018   Had one son from previous marriage, he died one day after Christmas, on MVA at age 69 yo    Family History:   The patient's family history includes Alcohol abuse in his father; Breast cancer in his sister; Colon cancer in his father; Coronary artery disease in his father and mother; Kidney cancer in his father.  ROS:  Please  see the history of present illness.  Unable to be obtained as patient is sedated on the ventilator.  Physical Exam/Data:   Vitals:   05/06/19 1640  BP: 107/73  Pulse: 86  Resp: (!) 22  Temp: 98.2 F (36.8 C)  TempSrc: Oral  SpO2: 99%  Weight: 92.1 kg  Height: 5\' 11"  (1.803 m)   No intake or output data in the 24 hours ending 05/06/19 1902 Filed Weights   05/06/19 1640  Weight: 92.1 kg   Body mass index is 28.32 kg/m.   Critical care physical exam used as patient was under sterile conditions getting central venous access and also on respiratory isolation while I was on the unit. General: Patient sedated on the ventilator. HEENT: ET tube in place. Lungs: Coarse breath sounds. Cardiac: Irregular, no gallop. Abdomen: Bowel sounds present. Extremities: No pitting edema.  EKG:  An ECG dated 05/05/2019 was personally reviewed today and demonstrated:  Rapid atrial fibrillation with new right bundle branch block and left anterior fascicular block with diffuse ST segment abnormalities, compared with previous tracing from October 5 which did not show conduction abnormalities.  Telemetry:  I personally reviewed telemetry which shows possible sinus rhythm at this time with  PACs.  Relevant CV Studies:  Echocardiogram 03/27/2019:  1. The left ventricle has moderately reduced systolic function, with an ejection fraction of 35-40%. The cavity size was normal. There is mildly increased left ventricular wall thickness. Left ventricular diastolic Doppler parameters are indeterminate.  Left ventricular diffuse hypokinesis.  2. The right ventricle has normal systolic function. The cavity was normal. There is no increase in right ventricular wall thickness. Right ventricular systolic pressure is mildly elevated with an estimated pressure of 31.0 mmHg.  3. Left atrial size was moderately dilated.  4. Tricuspid valve regurgitation is mild-moderate.  5. There is dilatation of the aortic root., 3.5 cm   Laboratory Data:  Chemistry Recent Labs  Lab 05/04/19 1502 05/05/19 1832 05/06/19 1205 05/06/19 1725  NA 138 138 141 140  K 3.9 3.8 4.1 3.8  CL 110 105 112*  --   CO2 19* 11* 10*  --   GLUCOSE 128* 198* 246*  --   BUN 47* 44* 39*  --   CREATININE 2.58* 3.06* 2.83*  --   CALCIUM 6.5* 7.1* 6.3*  --   GFRNONAA 25* 20* 22*  --   GFRAA 29* 24* 26*  --   ANIONGAP 9 22* 19*  --     Recent Labs  Lab 05/05/19 1832 05/06/19 1205  PROT 6.8 5.3*  ALBUMIN 3.1* 2.4*  AST 44* 628*  ALT 22 329*  ALKPHOS 62 61  BILITOT 0.7 0.6   Hematology Recent Labs  Lab 05/04/19 1002 05/05/19 1832 05/06/19 1205 05/06/19 1725  WBC 6.0 8.2 4.7  --   RBC 4.49 4.38 4.03*  --   HGB 12.9* 12.5* 11.5* 12.2*  HCT 41.0 41.1 40.4 36.0*  MCV 91.3 93.8 100.2*  --   MCH 28.7 28.5 28.5  --   MCHC 31.5 30.4 28.5*  --   RDW 14.8 15.0 15.1  --   PLT 298 353 244  --    Cardiac Enzymes Recent Labs  Lab 05/04/19 1002 05/04/19 1125 05/05/19 1832 05/05/19 2114 05/06/19 1205  TROPONINIHS 17 17 29* 167* 243*   BNP Recent Labs  Lab 05/04/19 1002  BNP 82.0     Radiology/Studies:  Dg Chest 2 View  Result Date: 05/04/2019 CLINICAL DATA:  Chest pain. EXAM: CHEST -  2 VIEW  COMPARISON:  Radiographs of April 13, 2019. FINDINGS: Stable cardiomediastinal silhouette. Atherosclerosis of thoracic aorta is noted. No pneumothorax or pleural effusion is noted. Reticular densities are noted throughout both lungs consistent with chronic interstitial lung disease. Slightly increased densities are noted in the right midlung laterally suggesting inflammation. Bony thorax is unremarkable. IMPRESSION: Reticular densities are noted throughout both lungs most consistent with chronic interstitial lung disease. Slightly increased densities are noted in the right midlung laterally suggesting superimposed inflammation. Aortic Atherosclerosis (ICD10-I70.0). Electronically Signed   By: Marijo Conception M.D.   On: 05/04/2019 09:40   Dg Abdomen 1 View  Result Date: 05/06/2019 CLINICAL DATA:  Nasogastric tube placement. EXAM: ABDOMEN - 1 VIEW COMPARISON:  None. FINDINGS: Nasogastric tube terminates just beyond the gastroesophageal junction with the side port in the lower esophagus. IMPRESSION: Nasogastric tube terminates just beyond the gastroesophageal junction, with the side port in the distal esophagus. Advancing approximately 10-15 cm would better position the tip within the stomach. Electronically Signed   By: Lorin Picket M.D.   On: 05/06/2019 13:41   Ct Head Wo Contrast  Result Date: 05/05/2019 CLINICAL DATA:  Seizure, covid positive patient EXAM: CT HEAD WITHOUT CONTRAST TECHNIQUE: Contiguous axial images were obtained from the base of the skull through the vertex without intravenous contrast. COMPARISON:  April 12, 2019 FINDINGS: Brain: No evidence of acute territorial infarction, hemorrhage, hydrocephalus,extra-axial collection or mass lesion/mass effect. Normal gray-white differentiation. There is mild dilatation the ventricles and sulci consistent with age-related atrophy. Vascular: No hyperdense vessel. Calcifications are seen within the internal carotid artery and bilateral  vertebral arteries. Skull: The skull is intact. No fracture or focal lesion identified. Sinuses/Orbits: The visualized paranasal sinuses and mastoid air cells are clear. The orbits and globes intact. Other: None IMPRESSION: No acute intracranial abnormality. Findings consistent with age related atrophy Electronically Signed   By: Prudencio Pair M.D.   On: 05/05/2019 19:30   Dg Chest Portable 1 View  Result Date: 05/06/2019 CLINICAL DATA:  Endotracheal tube placement. EXAM: PORTABLE CHEST 1 VIEW COMPARISON:  05/05/2019 and 05/04/2019. FINDINGS: Endotracheal tube terminates 5.3 cm above the carina. Nasogastric tube is followed into the distal esophagus with the tip projecting beyond the inferior margin of the image. Heart is enlarged, stable. Thoracic aorta is calcified. Apparent worsening in mixed interstitial and airspace opacification may be due in part to lower lung volumes on the current study. Apparent lucency in the periphery of the upper right hemithorax is unchanged from 05/04/2019 and may be due to bullous emphysema. IMPRESSION: 1. Endotracheal tube is in satisfactory position. 2. Apparent worsening in mixed interstitial and airspace opacification may be due to expiratory phase imaging versus worsening pulmonary edema. Electronically Signed   By: Lorin Picket M.D.   On: 05/06/2019 13:39   Dg Chest Portable 1 View  Result Date: 05/05/2019 CLINICAL DATA:  Shortness of breath, dizziness, and weakness. COVID-19 virus infection. EXAM: PORTABLE CHEST 1 VIEW COMPARISON:  05/04/2019 FINDINGS: Stable mild cardiomegaly. Prior median sternotomy. Underlying chronic interstitial lung disease is again noted. Increased opacity is seen in both lower lobes, suspicious for superimposed pneumonia. No evidence of pleural effusion. IMPRESSION: Increased opacity in both lower lobes, suspicious for pneumonia superimposed on underlying chronic interstitial lung disease. Stable mild cardiomegaly. Electronically Signed   By:  Marlaine Hind M.D.   On: 05/05/2019 19:20    Assessment and Plan:   1.  Status post cardiac arrest.  Possibly arrhythmogenic and/or precipitated by ischemia with VT  described by ER provider note but also rapid atrial fibrillation and no pulse.  Patient was cardioverted and placed on amiodarone.  Records suggest patient reported chest pain on October 5, his ECG at that point showed rate controlled atrial fibrillation and subsequent tracing from October 6 showed rapid atrial fibrillation with new right bundle branch block and left anterior fascicular block, diffuse ST segment abnormalities.  High-sensitivity troponin I levels were within normal range on October 5, subsequently increased to 29, 167, and most recently 243.  He has known ischemic heart disease status post previous CABG and PCI as well as cardiomyopathy and LVEF of 35 to 40% range.  2.  Recent seizure activity.  Neurology has been consulted by primary team.  He is currently on Precedex and fentanyl drips.  Head CT from yesterday reported no acute findings.  3.  VDRF and hypotension, presently on Levophed and with full ventilator support per critical care team.  4.  Atrial fibrillation, previously on Eliquis and now on IV heparin.  5.  Recent COVID-19 pneumonia treated at [facility] in September.  Continue with supportive measures.  Echocardiogram has been ordered in follow-up.  Would also repeat ECG.  Continue heparin and amiodarone.  Signed, Rozann Lesches, MD  05/06/2019 7:02 PM

## 2019-05-06 NOTE — ED Notes (Signed)
Date and time results received: 05/06/19 1322 (use smartphrase ".now" to insert current time)  Test: CMP, Calcium Critical Value: 6.3  Name of Provider Notified: Dr. Lenise Arena  Orders Received? Or Actions Taken?: Orders Received - See Orders for details

## 2019-05-06 NOTE — Code Documentation (Signed)
Compressions started at this time

## 2019-05-06 NOTE — ED Notes (Signed)
Patient currently stable on 4L O2. No needs at this time.

## 2019-05-06 NOTE — Progress Notes (Signed)
ANTICOAGULATION CONSULT NOTE   Pharmacy Consult for Heparin  Indication: pulmonary embolus  No Known Allergies  Patient Measurements: Height: 5\' 11"  (180.3 cm) Weight: 203 lb 0.7 oz (92.1 kg) IBW/kg (Calculated) : 75.3 Heparin Dosing Weight:  96.2 kg   Vital Signs: Temp: 98.2 F (36.8 C) (10/07 1640) Temp Source: Oral (10/07 1640) BP: 112/78 (10/07 2012) Pulse Rate: 87 (10/07 2012)  Labs: Recent Labs    05/04/19 1502 05/05/19 1832  05/05/19 2114 05/06/19 0552 05/06/19 1205 05/06/19 1725 05/06/19 2052  HGB  --  12.5*  --   --   --  11.5* 12.2* 12.0*  HCT  --  41.1  --   --   --  40.4 36.0* 37.4*  PLT  --  353  --   --   --  244  --  342  APTT  --   --    < > 31 42* 64*  --  118*  LABPROT  --   --   --  17.3*  --   --   --  18.0*  INR  --   --   --  1.4*  --   --   --  1.5*  HEPARINUNFRC  --   --   --   --  0.61  --   --  0.87*  CREATININE 2.58* 3.06*  --   --   --  2.83*  --   --   TROPONINIHS  --  29*  --  167*  --  243*  --   --    < > = values in this interval not displayed.    Estimated Creatinine Clearance: 30.2 mL/min (A) (by C-G formula based on SCr of 2.83 mg/dL (H)).   Medical History: Past Medical History:  Diagnosis Date  . Atrial fibrillation ([place])   . Bleeding gastrointestinal   . CAD (coronary artery disease)    Status post CABG and PCI  . 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 Cardiomyopathy ([place])   . Chronic depression   . CKD (chronic kidney disease)   . Diabetes mellitus ([place])   . Gastric ulcer with hemorrhage   . Gastritis   . GERD (gastroesophageal reflux disease)   . Hx of CABG 12/1994  . IDA (iron deficiency anemia)   . 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 Myocardial infarction ([place]) 2017  . Personal history of radiation therapy 05/30/2018   39 treatments with chemotherapy nasopharyngeal cancer  . 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 apixaban (ELIQUIS) 5 MG TABS tablet Take 1 tablet (5 mg total) by mouth 2 (two) times daily. 60 tablet 0   . budesonide-formoterol (SYMBICORT) 160-4.5 MCG/ACT inhaler Inhale 2 puffs into the lungs 2 (two) times daily. Rinse mouth after use 1 Inhaler 12   . Calcium Carbonate-Vitamin D3 (CALCIUM 600-D) 600-400 MG-UNIT TABS Take 1 tablet by mouth daily.     . Cholecalciferol (VITAMIN D3) 25 MCG (1000 UT) CAPS Take by mouth.     . cilostazol (PLETAL) 100 MG tablet Take 100 mg by mouth 2 (two) times daily.      . colchicine 0.6 MG tablet Take 0.6 mg by mouth once.     . diphenoxylate-atropine (LOMOTIL) 2.5-0.025 MG tablet Take 1 tablet by mouth 4 (four) times daily as needed for diarrhea or loose stools.     Marland Kitchen  ezetimibe (ZETIA) 10 MG tablet Take 10 mg by mouth daily.      . fenofibrate 160 MG tablet Take 160 mg by mouth daily.      . Insulin Degludec (TRESIBA FLEXTOUCH) 200 UNIT/ML SOPN Inject 12 Units into the skin at bedtime.      . isosorbide mononitrate (IMDUR) 120 MG 24 hr tablet Take 120 mg by mouth daily.     . lansoprazole (PREVACID) 30 MG capsule Take 30 mg by mouth every morning.      Marland Kitchen losartan (COZAAR) 25 MG tablet Take 25 mg by mouth daily.      Marland Kitchen lovastatin (MEVACOR) 40 MG tablet Take 40 mg by mouth 2 (two) times daily.      . meclizine (ANTIVERT) 12.5 MG tablet Take 1 tablet (12.5 mg total) by mouth 3 (three) times daily as needed for dizziness. 30 tablet 0   . metoprolol tartrate (LOPRESSOR) 100 MG tablet Take 1 tablet (100 mg total) by mouth 2 (two) times daily. 60 tablet 0   . nitroGLYCERIN (NITROSTAT) 0.4 MG SL tablet Place 0.4 mg under the tongue every 5 (five) minutes as needed for chest pain.      . sodium bicarbonate 650 MG tablet Take 1 tablet by mouth daily.      . ticagrelor (BRILINTA) 60 MG TABS tablet Take 60 mg by mouth 2 (two) times daily.        Assessment: Pharmacy consulted to dose Heparin in this 68 year old  male presented with PE.  Pt was on apixaban PTA, uncertain when last dose was given.   CrCl = 28.9 ml/min. Heparin level was elevated at 0.61 but aPTT was 42, will use aPTT to monitor heparin.   On arrival to [facility] heparin gtt at 1900 (not 2050 units/hr as planned increase at [facility], does not appear 1400 unit bolus was given), continued here at 1900 units/hr and aPTT supratherapeutic at 118 sec.   Goal of Therapy:  APTT = 66 - 102  Heparin level 0.3-0.7 units/ml once heparin level and aPTT correlate.  Monitor platelets by anticoagulation protocol: Yes   Plan:  Decrease heparin gtt to 1800 units/hr F/u aPTT/HL with AM labs to confirm F/u PE workup  Bertis Ruddy, PharmD Clinical Pharmacist Please check AMION for all [place] numbers 05/06/2019 9:34 PM

## 2019-05-06 NOTE — ED Notes (Signed)
Patient states he is feeling much better than this afternoon. Meal tray given. Warm blanket given. No other needs at this time.

## 2019-05-06 NOTE — Code Documentation (Signed)
Patient is being manually bagged by Maudie Mercury, RN through ET tube.

## 2019-05-06 NOTE — ED Notes (Signed)
HOB elevated to 30 degrees.

## 2019-05-06 NOTE — Progress Notes (Signed)
Sputum trap left in place, unable to obtain sputum at this time.

## 2019-05-06 NOTE — ED Notes (Signed)
Date and time results received: 05/06/19 2325  Test: Troponin Critical Value: 167  Name of Provider Notified: Dr. Alfred Levins  Orders Received? Or Actions Taken?: Acknowledged

## 2019-05-06 NOTE — Code Documentation (Signed)
Patient has a pulse at this time.

## 2019-05-07 ENCOUNTER — Inpatient Hospital Stay (HOSPITAL_COMMUNITY): Payer: Medicare Other

## 2019-05-07 ENCOUNTER — Inpatient Hospital Stay (HOSPITAL_COMMUNITY): Payer: Federal, State, Local not specified - PPO

## 2019-05-07 DIAGNOSIS — N179 Acute kidney failure, unspecified: Secondary | ICD-10-CM | POA: Diagnosis not present

## 2019-05-07 DIAGNOSIS — I82409 Acute embolism and thrombosis of unspecified deep veins of unspecified lower extremity: Secondary | ICD-10-CM

## 2019-05-07 DIAGNOSIS — J9601 Acute respiratory failure with hypoxia: Secondary | ICD-10-CM | POA: Diagnosis not present

## 2019-05-07 LAB — POCT I-STAT 7, (LYTES, BLD GAS, ICA,H+H)
Acid-base deficit: 14 mmol/L — ABNORMAL HIGH (ref 0.0–2.0)
Acid-base deficit: 14 mmol/L — ABNORMAL HIGH (ref 0.0–2.0)
Bicarbonate: 12.1 mmol/L — ABNORMAL LOW (ref 20.0–28.0)
Bicarbonate: 12 mmol/L — ABNORMAL LOW (ref 20.0–28.0)
Calcium, Ion: 1.04 mmol/L — ABNORMAL LOW (ref 1.15–1.40)
Calcium, Ion: 1 mmol/L — ABNORMAL LOW (ref 1.15–1.40)
HCT: 44 % (ref 39.0–52.0)
HCT: 29 % — ABNORMAL LOW (ref 39.0–52.0)
Hemoglobin: 15 g/dL (ref 13.0–17.0)
Hemoglobin: 9.9 g/dL — ABNORMAL LOW (ref 13.0–17.0)
O2 Saturation: 99 %
O2 Saturation: 93 %
Patient temperature: 98.6
Patient temperature: 97.4
Potassium: 4.5 mmol/L (ref 3.5–5.1)
Potassium: 4.3 mmol/L (ref 3.5–5.1)
Pressure control: 13 cmH2O
Sodium: 135 mmol/L (ref 135–145)
Sodium: 141 mmol/L (ref 135–145)
TCO2: 13 mmol/L — ABNORMAL LOW (ref 22–32)
TCO2: 13 mmol/L — ABNORMAL LOW (ref 22–32)
pCO2 arterial: 29.3 mmHg — ABNORMAL LOW (ref 32.0–48.0)
pCO2 arterial: 26.1 mmHg — ABNORMAL LOW (ref 32.0–48.0)
pH, Arterial: 7.225 — ABNORMAL LOW (ref 7.350–7.450)
pH, Arterial: 7.267 — ABNORMAL LOW (ref 7.350–7.450)
pO2, Arterial: 157 mmHg — ABNORMAL HIGH (ref 83.0–108.0)
pO2, Arterial: 73 mmHg — ABNORMAL LOW (ref 83.0–108.0)

## 2019-05-07 LAB — BASIC METABOLIC PANEL
Anion gap: 18 — ABNORMAL HIGH (ref 5–15)
Anion gap: 14 (ref 5–15)
BUN: 42 mg/dL — ABNORMAL HIGH (ref 8–23)
BUN: 44 mg/dL — ABNORMAL HIGH (ref 8–23)
CO2: 13 mmol/L — ABNORMAL LOW (ref 22–32)
CO2: 15 mmol/L — ABNORMAL LOW (ref 22–32)
Calcium: 6.8 mg/dL — ABNORMAL LOW (ref 8.9–10.3)
Calcium: 6.5 mg/dL — ABNORMAL LOW (ref 8.9–10.3)
Chloride: 109 mmol/L (ref 98–111)
Chloride: 112 mmol/L — ABNORMAL HIGH (ref 98–111)
Creatinine, Ser: 3.81 mg/dL — ABNORMAL HIGH (ref 0.61–1.24)
Creatinine, Ser: 4.62 mg/dL — ABNORMAL HIGH (ref 0.61–1.24)
GFR calc Af Amer: 18 mL/min — ABNORMAL LOW (ref >60)
GFR calc Af Amer: 14 mL/min — ABNORMAL LOW (ref >60)
GFR calc non Af Amer: 16 mL/min — ABNORMAL LOW (ref >60)
GFR calc non Af Amer: 12 mL/min — ABNORMAL LOW (ref >60)
Glucose, Bld: 172 mg/dL — ABNORMAL HIGH (ref 70–99)
Glucose, Bld: 141 mg/dL — ABNORMAL HIGH (ref 70–99)
Potassium: 4.8 mmol/L (ref 3.5–5.1)
Potassium: 4.7 mmol/L (ref 3.5–5.1)
Sodium: 140 mmol/L (ref 135–145)
Sodium: 141 mmol/L (ref 135–145)

## 2019-05-07 LAB — CBC
HCT: 40.3 % (ref 39.0–52.0)
Hemoglobin: 12.3 g/dL — ABNORMAL LOW (ref 13.0–17.0)
MCH: 29.5 pg (ref 26.0–34.0)
MCHC: 30.5 g/dL (ref 30.0–36.0)
MCV: 96.6 fL (ref 80.0–100.0)
Platelets: 357 10*3/uL (ref 150–400)
RBC: 4.17 MIL/uL — ABNORMAL LOW (ref 4.22–5.81)
RDW: 15.5 % (ref 11.5–15.5)
WBC: 9.5 10*3/uL (ref 4.0–10.5)
nRBC: 0.2 % (ref 0.0–0.2)

## 2019-05-07 LAB — URINE CULTURE: Culture: NO GROWTH

## 2019-05-07 LAB — STREP PNEUMONIAE URINARY ANTIGEN: Strep Pneumo Urinary Antigen: NEGATIVE

## 2019-05-07 LAB — PHOSPHORUS
Phosphorus: 5.8 mg/dL — ABNORMAL HIGH (ref 2.5–4.6)
Phosphorus: 6.7 mg/dL — ABNORMAL HIGH (ref 2.5–4.6)

## 2019-05-07 LAB — TRIGLYCERIDES: Triglycerides: 111 mg/dL (ref <150)

## 2019-05-07 LAB — APTT
aPTT: 122 seconds — ABNORMAL HIGH (ref 24–36)
aPTT: 67 seconds — ABNORMAL HIGH (ref 24–36)

## 2019-05-07 LAB — LACTIC ACID, PLASMA: Lactic Acid, Venous: 0.9 mmol/L (ref 0.5–1.9)

## 2019-05-07 LAB — MAGNESIUM
Magnesium: 2.2 mg/dL (ref 1.7–2.4)
Magnesium: 2.2 mg/dL (ref 1.7–2.4)

## 2019-05-07 LAB — ACETAMINOPHEN LEVEL: Acetaminophen (Tylenol), Serum: <10 ug/mL — ABNORMAL LOW (ref 10–30)

## 2019-05-07 LAB — HEPARIN LEVEL (UNFRACTIONATED): Heparin Unfractionated: 0.97 IU/mL — ABNORMAL HIGH (ref 0.30–0.70)

## 2019-05-07 LAB — GLUCOSE, CAPILLARY
Glucose-Capillary: 116 mg/dL — ABNORMAL HIGH (ref 70–99)
Glucose-Capillary: 133 mg/dL — ABNORMAL HIGH (ref 70–99)
Glucose-Capillary: 141 mg/dL — ABNORMAL HIGH (ref 70–99)

## 2019-05-07 LAB — SALICYLATE LEVEL: Salicylate Lvl: <7 mg/dL (ref 2.8–30.0)

## 2019-05-07 IMAGING — DX DG CHEST 1V PORT
1 series · 1 of 1 positions shown · non-contrast
Comparison: Radiograph [DATE].

CLINICAL DATA: Endotracheal tube and central line placement.

EXAM:
PORTABLE CHEST 1 VIEW

[chest]
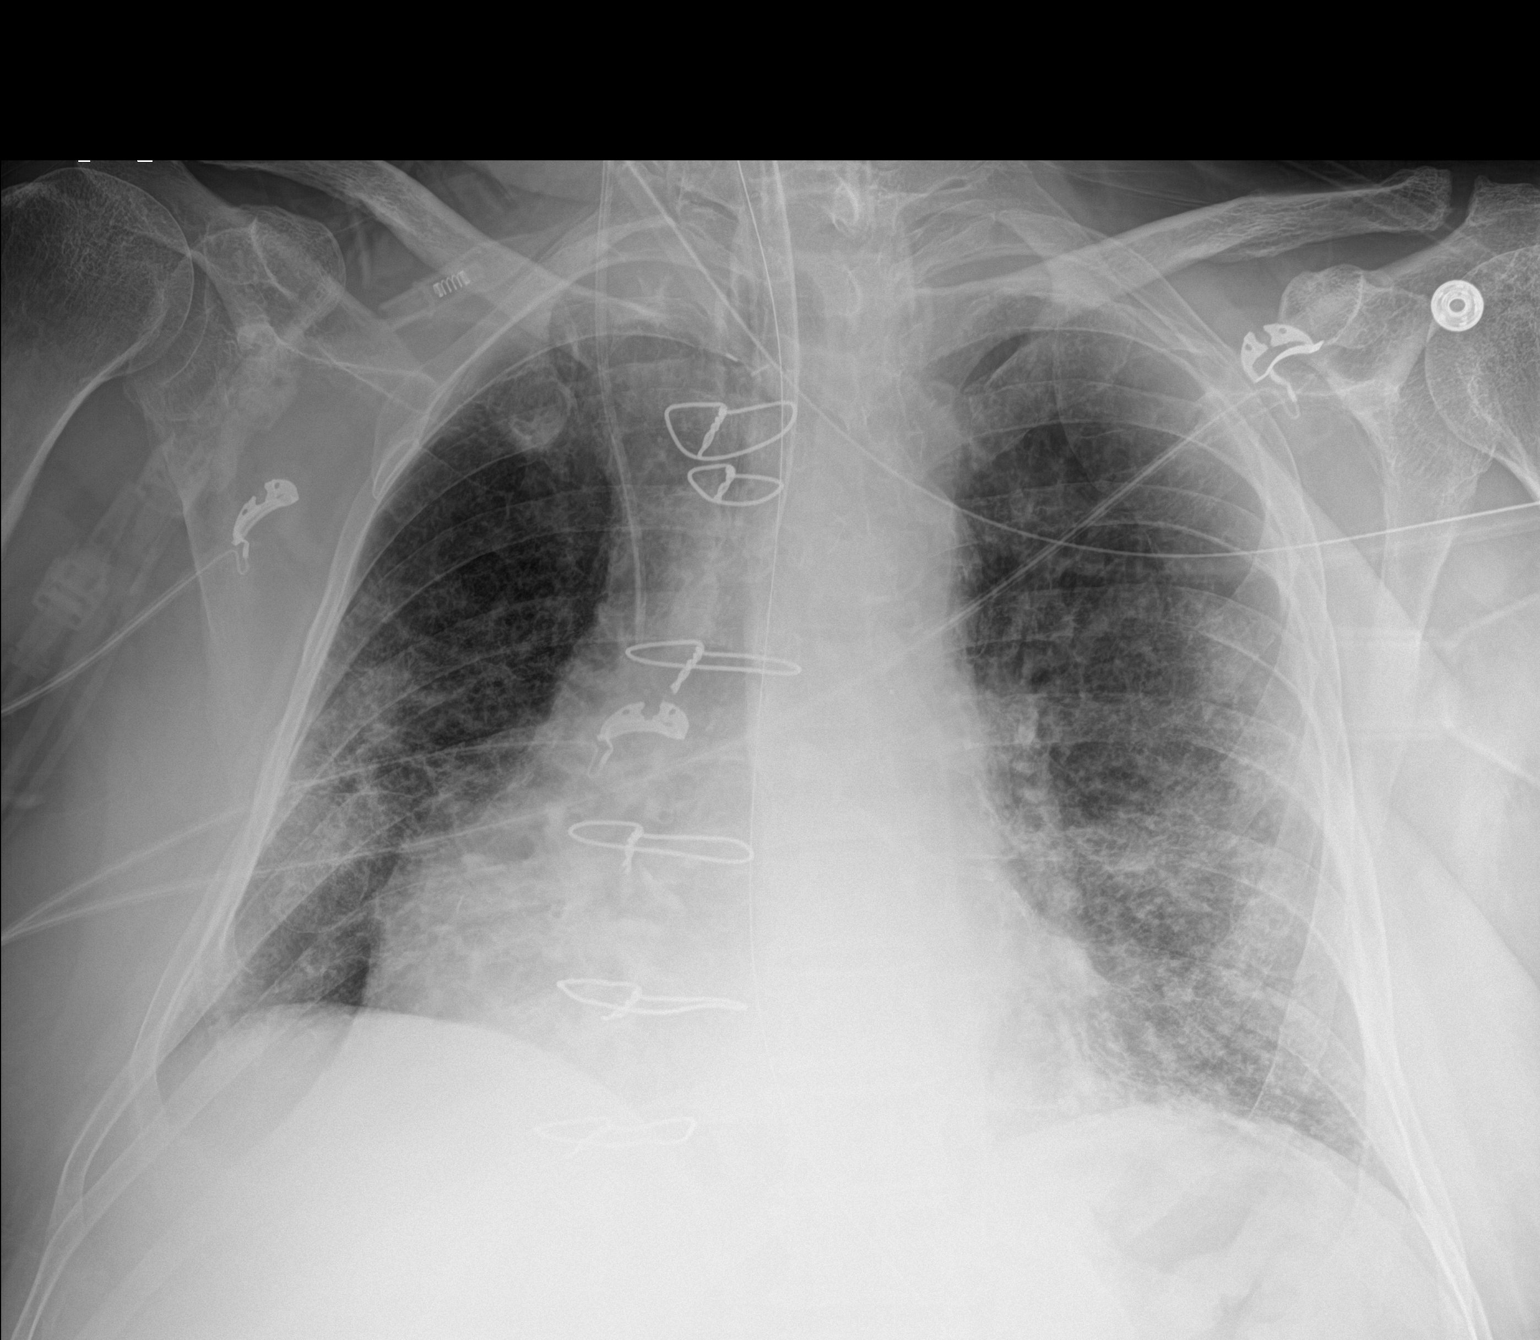

[1 of 1 positions shown; findings below may reference images not displayed]

FINDINGS: Stable cardiomediastinal silhouette. Endotracheal and nasogastric
tubes are unchanged in position. Right internal jugular catheter is
unchanged with distal tip in expected position of the SVC. No
pneumothorax is noted. Stable bibasilar opacities are noted. No
pleural effusion is noted. Bony thorax unremarkable.
IMPRESSION: Grossly stable support apparatus. Stable bibasilar opacities as
described above.

## 2019-05-07 MED ORDER — VITAL AF 1.2 CAL PO LIQD: 1000.0000 mL | ORAL | Status: DC

## 2019-05-07 MED ORDER — HEPARIN (PORCINE) 25000 UT/250ML-% IV SOLN
1750.0000 [IU]/h | INTRAVENOUS | Status: DC
  Administered 2019-05-07 – 2019-05-11 (×6): 1600 [IU]/h via INTRAVENOUS
  Administered 2019-05-11 – 2019-05-12 (×2): 1750 [IU]/h via INTRAVENOUS
  Filled 2019-05-07 (×8): qty 250

## 2019-05-07 MED ORDER — IPRATROPIUM-ALBUTEROL 0.5-2.5 (3) MG/3ML IN SOLN: 3.0000 mL | Freq: Four times a day (QID) | RESPIRATORY_TRACT | Status: DC

## 2019-05-07 MED ORDER — LACTATED RINGERS IV BOLUS
500.0000 mL | Freq: Once | INTRAVENOUS | Status: AC
  Administered 2019-05-07: 500 mL via INTRAVENOUS

## 2019-05-07 MED ORDER — LEVETIRACETAM IN NACL 500 MG/100ML IV SOLN
500.0000 mg | Freq: Two times a day (BID) | INTRAVENOUS | Status: DC
  Administered 2019-05-07 – 2019-05-08 (×2): 500 mg via INTRAVENOUS
  Filled 2019-05-07 (×2): qty 100

## 2019-05-07 MED ORDER — SODIUM BICARBONATE 650 MG PO TABS
650.0000 mg | ORAL_TABLET | Freq: Three times a day (TID) | ORAL | Status: DC
  Administered 2019-05-07: 650 mg via ORAL
  Filled 2019-05-07: qty 1

## 2019-05-07 MED ORDER — CALCITRIOL 0.25 MCG PO CAPS
0.2500 ug | ORAL_CAPSULE | Freq: Every day | ORAL | Status: DC
  Administered 2019-05-07 – 2019-05-19 (×13): 0.25 ug via ORAL
  Filled 2019-05-07 (×15): qty 1

## 2019-05-07 MED ORDER — PRO-STAT SUGAR FREE PO LIQD: 30.0000 mL | Freq: Two times a day (BID) | ORAL | Status: DC

## 2019-05-07 MED ORDER — VANCOMYCIN HCL 10 G IV SOLR
1500.0000 mg | INTRAVENOUS | Status: DC
  Filled 2019-05-07: qty 1500

## 2019-05-07 MED ORDER — "THROMBI-PAD 3""X3"" EX PADS"
1.0000 | MEDICATED_PAD | Freq: Once | CUTANEOUS | Status: AC
  Administered 2019-05-07: 1 via TOPICAL
  Filled 2019-05-07: qty 1

## 2019-05-07 MED ORDER — BUDESONIDE 0.25 MG/2ML IN SUSP
0.2500 mg | Freq: Two times a day (BID) | RESPIRATORY_TRACT | Status: DC
  Filled 2019-05-07: qty 2

## 2019-05-07 MED ORDER — PIPERACILLIN-TAZOBACTAM 3.375 G IVPB
3.3750 g | Freq: Two times a day (BID) | INTRAVENOUS | Status: DC
  Filled 2019-05-07: qty 50

## 2019-05-07 NOTE — Progress Notes (Signed)
Bilateral lower extremity venous duplex completed. Refer to "CV Proc" under chart review to view preliminary results.  05/07/2019 9:55 AM Maudry Mayhew, MHA, RVT, RDCS, RDMS

## 2019-05-07 NOTE — Progress Notes (Signed)
ANTICOAGULATION CONSULT NOTE   Pharmacy Consult for Heparin  Indication: pulmonary embolus  No Known Allergies  Patient Measurements: Height: 5\' 11"  (180.3 cm) Weight: 200 lb 9.9 oz (91 kg) IBW/kg (Calculated) : 75.3 Heparin Dosing Weight:  96.2 kg   Vital Signs: Temp: 98.6 F (37 C) (10/08 0000) Temp Source: Oral (10/08 0000) BP: 132/77 (10/08 0400) Pulse Rate: 65 (10/08 0400)  Labs: Recent Labs    05/05/19 1832  05/05/19 2114 05/06/19 0552 05/06/19 1205  05/06/19 2052 05/07/19 0342 05/07/19 0400  HGB 12.5*  --   --   --  11.5*   < > 12.0* 12.3* 15.0  HCT 41.1  --   --   --  40.4   < > 37.4* 40.3 44.0  PLT 353  --   --   --  244  --  342 357  --   APTT  --    < > 31 42* 64*  --  118* 122*  --   LABPROT  --   --  17.3*  --   --   --  18.0*  --   --   INR  --   --  1.4*  --   --   --  1.5*  --   --   HEPARINUNFRC  --   --   --  0.61  --   --  0.87* 0.97*  --   CREATININE 3.06*  --   --   --  2.83*  --  3.63*  --   --   TROPONINIHS 29*  --  167*  --  243*  --   --   --   --    < > = values in this interval not displayed.    Estimated Creatinine Clearance: 23.4 mL/min (A) (by C-G formula based on SCr of 3.63 mg/dL (H)).  Assessment:  68 year old male presented with PE; transfer from [facility] - recent covid-19 in sept. .  Pt was on apixaban PTA, uncertain when last dose was given.   CrCl = 28.9 ml/min  Monitoring aPTT as not correlating with hep lvl  APTT now elevated 122   Minimal oozing around central line site  Goal of Therapy:  APTT = 66 - 102  Heparin level 0.3-0.7 units/ml once heparin level and aPTT correlate.  Monitor platelets by anticoagulation protocol: Yes   Plan:  Hold heparin x 1 hr Resume 1600 units/hr @ 0530 APTT 1330  Levester Fresh, PharmD, BCPS, BCCCP Clinical Pharmacist [phone]  Please check AMION for all [place] numbers  05/07/2019 4:35 AM

## 2019-05-07 NOTE — Procedures (Signed)
Extubation Procedure Note  Patient Details:   Name: Dylan Goodman DOB: 15-Jan-1951 MRN: [id number]   Airway Documentation:    Vent end date: 05/07/19 Vent end time: 1212   Evaluation  O2 sats: stable throughout Complications: No apparent complications Patient did tolerate procedure well. Bilateral Breath Sounds: Clear   Yes   Patient extubated to [place]. Vital signs stable at this time. No complications. RT will continue to monitor.  Mcneil Sober 05/07/2019, 12:19 PM

## 2019-05-07 NOTE — Progress Notes (Signed)
NAME:  Dylan Goodman, MRN:  [id number], DOB:  25-Jan-1951, LOS: 1 ADMISSION DATE:  May 20, 2019, CONSULTATION DATE:  05-20-2023 REFERRING MD:  Dr Jimmye Norman [place] [facility] EDP, CHIEF COMPLAINT:  Cardiac arrest   Brief History   68 year old male recently admitted with COVID PNA 9/13 now presenting with seizure and subsequently suffered brief cardiac arrest in the [facility] ED. Intubated and transferred to [facility].    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  Significant Hospital Events   9/14-9/19 admit to [facility] for COVID pneumonia.  10/5 to ED for Chest pain. Discharged home after negative cardiac. 05/19/2023 To ED for seizure 05/20/23 coded in ED while awaiting placement  Consults:  Neurology  Procedures:  ETT 05-20-23 >  Significant Diagnostic Tests:  Lower extremity doppler May 20, 2023 >  Micro Data:  COVID 2023-05-19 > POSITIVE Blood May 20, 2023 >  Urine 2023/05/20 > Trach aspirate 05/20/2023 >  Antimicrobials:  Zosyn May 20, 2023 > Vancomycin 20-May-2023 >  Interim history/subjective:  NO acute events overnight. Wakes up and follows commands. Weaning well on vent PSV 5/5.   Objective   Blood pressure (!) 100/54, pulse (!) 53, temperature 98.5 F (36.9 C), temperature source Oral, resp. rate 14, height 5\' 11"  (1.803 m), weight 91 kg, SpO2 98 %.   CVP:  [7 mmHg-9 mmHg] 7 mmHg   Vent Mode: CPAP;PSV FiO2 (%):  [40 %-100 %] 40 % Set Rate:  [16 bmp-18 bmp] 18 bmp Vt Set:  [500 mL-620 mL] 620 mL PEEP:  [5 cmH20-8  cmH20] 5 cmH20 Pressure Support:  [5 cmH20] 5 cmH20 Plateau Pressure:  [15 cmH20] 15 cmH20   Intake/Output Summary (Last 24 hours) at 05/07/2019 1046 Last data filed at 05/07/2019 1000 Gross per 24 hour  Intake 3305.64 ml  Output 335 ml  Net 2970.64 ml   Filed Weights   05-20-19 1640 05/07/19 0343  Weight: 92.1 kg 91 kg    Examination: from door with aid of RN given COVID positive status.  General: elderly appearing male on vent HENT: /AT, PERRL, no appreciable JVD Lungs: RN reports rhonchi bilaterally.  Cardiovascular: Sinus bradycardia Abdomen: Soft, NT, ND and +BS  Extremities: no acute deformity. Moving all extremities.  Neuro: RASS -1. Wakes up and follows commands.  Skin: Grossly intact. No edema.   Resolved Hospital Problem list   N/A  Assessment & Plan:  68 year old male with diagnosis of COVID 19 3 weeks PTP who presents to [facility] with seizure who was in the ED in [facility] when he reportedly had a seizure followed by a cardiac arrest vs spontaneous cardiac arrest.  Given persistent positive COVID status patient was not admitted to [facility] and PCCM was called to accept on transfer.  Neurology wanted patient in [facility] for EEG and neurology to evaluate.   Cardiac arrest: initial rhythm reported as VT with conversion to AF RVR. He was defibrillated. Total downtime estimated at 11 mins.  - Heparin drip - Amiodarone drip held due to bradycardia.  - Cardiology following - Tele monitoring - Echo  Seizure: - Neuro has made recs and signed off.  - Keppra 500mg   BID - Precedex and fentanyl infusions, wean.  - Versed PRN for seizure if recurs  Shock: etiology unclear. Presumably septic with suspected aspiration. Also a likely element of hypovolemia. CVP 6.  - Levophed for MAP 65 - Weaning to off - IVF bolus now  VDRF: - Hopeful he will wean to extubation today.   A-fib: - Heparin infusoin - Holding eliquis - Cardiology following  COVID-19 infection: Doubtful for active  infection at this point - No COVID specific treatment  Metabolic acidosis: elevated anion gap. ? Renal failure.  - Follow BMP - Hydrate - Consider Bicarb.    Best practice:  Diet: NPO Pain/Anxiety/Delirium protocol (if indicated):  VAP protocol (if indicated): Per protocol DVT prophylaxis: Heparin infusion GI prophylaxis: PPI Glucose control: SSI Mobility: BR Code Status: FULL Family Communication: No family present.  Disposition: ICU  Labs   CBC: Recent Labs  Lab 05/04/19 1002 05/05/19 1832 05/06/19 1205 05/06/19 1725 05/06/19 2052 05/07/19 0342 05/07/19 0400  WBC 6.0 8.2 4.7  --  11.1* 9.5  --   NEUTROABS  --   --  1.5*  --   --   --   --   HGB 12.9* 12.5* 11.5* 12.2* 12.0* 12.3* 15.0  HCT 41.0 41.1 40.4 36.0* 37.4* 40.3 44.0  MCV 91.3 93.8 100.2*  --  93.0 96.6  --   PLT 298 353 244  --  342 357  --     Basic Metabolic Panel: Recent Labs  Lab 05/04/19 1502 05/05/19 1832 05/06/19 1205 05/06/19 1725 05/06/19 2052 05/07/19 0342 05/07/19 0400  NA 138 138 141 140 139 140 135  K 3.9 3.8 4.1 3.8 3.7 4.8 4.5  CL 110 105 112*  --  110 109  --   CO2 19* 11* 10*  --  14* 13*  --   GLUCOSE 128* 198* 246*  --  146* 172*  --   BUN 47* 44* 39*  --  42* 42*  --   CREATININE 2.58* 3.06* 2.83*  --  3.63* 3.81*  --   CALCIUM 6.5* 7.1* 6.3*  --  6.2* 6.8*  --   MG  --   --  1.9  --  1.1* 2.2  --   PHOS  --   --   --   --  5.6* 5.8*  --    GFR: Estimated Creatinine Clearance: 22.3 mL/min (A) (by C-G formula based on SCr of 3.81 mg/dL (H)). Recent Labs  Lab 05/05/19 1832 05/05/19 2114 05/06/19 1205 05/06/19 2052 05/07/19 0020 05/07/19 0342  PROCALCITON  --  0.14  --  14.10  --   --   WBC 8.2  --  4.7 11.1*  --  9.5  LATICACIDVEN  --  1.2  --  1.1 0.9  --     Liver Function Tests: Recent Labs  Lab 05/05/19 1832 05/06/19 1205 05/06/19 2052  AST 44* 628* 1,197*  ALT 22 329* 376*  ALKPHOS 62 61 91  BILITOT 0.7 0.6 0.9  PROT 6.8 5.3* 5.5*  ALBUMIN 3.1*  2.4* 2.4*   No results for input(s): LIPASE, AMYLASE in the last 168 hours. No results for input(s): AMMONIA in the last 168 hours.  ABG    Component Value Date/Time   PHART 7.225 (L) 05/07/2019 0400   PCO2ART 29.3 (L) 05/07/2019 0400   PO2ART 157.0 (H) 05/07/2019 0400   HCO3 12.1 (L) 05/07/2019 0400   TCO2 13 (L) 05/07/2019 0400   ACIDBASEDEF 14.0 (H) 05/07/2019 0400  O2SAT 99.0 05/07/2019 0400     Coagulation Profile: Recent Labs  Lab 05/05/19 2114 05/06/19 2052  INR 1.4* 1.5*    Cardiac Enzymes: No results for input(s): CKTOTAL, CKMB, CKMBINDEX, TROPONINI in the last 168 hours.  HbA1C: Hgb A1c MFr Bld  Date/Time Value Ref Range Status  03/12/2019 07:14 AM 6.7 (H) 4.8 - 5.6 % Final    Comment:    (NOTE) Pre diabetes:          5.7%-6.4% Diabetes:              >6.4% Glycemic control for   <7.0% adults with diabetes   09/08/2018 12:03 PM 6.3 (H) <5.7 % of total Hgb Final    Comment:    For someone without known diabetes, a hemoglobin  A1c value between 5.7% and 6.4% is consistent with prediabetes and should be confirmed with a  follow-up test. . For someone with known diabetes, a value <7% indicates that their diabetes is well controlled. A1c targets should be individualized based on duration of diabetes, age, comorbid conditions, and other considerations. . This assay result is consistent with an increased risk of diabetes. . Currently, no consensus exists regarding use of hemoglobin A1c for diagnosis of diabetes for children. .     CBG: Recent Labs  Lab 05/06/19 1619 05/07/19 0019  GLUCAP 201* 133*    Review of Systems:   Unable as patient is encephalopathic and intubated.   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   Surgical History    Past Surgical History:  Procedure Laterality Date  . CAROTID ENDARTERECTOMY  2006  . COLONOSCOPY WITH PROPOFOL N/A 03/11/2019   Procedure: COLONOSCOPY WITH PROPOFOL;  Surgeon: Toledo, Benay Pike, MD;  Location: [facility] ENDOSCOPY;  Service: Gastroenterology;  Laterality: N/A;  . CORONARY ARTERY BYPASS GRAFT  1996   quadruple bypass  . ESOPHAGOGASTRODUODENOSCOPY (EGD) WITH PROPOFOL N/A 03/11/2019   Procedure: ESOPHAGOGASTRODUODENOSCOPY (EGD) WITH PROPOFOL;  Surgeon: Toledo, Benay Pike, MD;  Location: [facility] ENDOSCOPY;  Service: Gastroenterology;  Laterality: N/A;  . FEMORAL-POPLITEAL BYPASS GRAFT Right 06/1998   11/2001, 08/2003  . 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 LOWER EXTREMITY ANGIOGRAPHY Left 11/11/2018   Procedure: LOWER EXTREMITY ANGIOGRAPHY;  Surgeon: Katha Cabal, MD;  Location: [place] [facility];  Service: Cardiovascular;  Laterality: Left;  . REVISION OF AORTA BIFEMORAL BYPASS    . Stent revascularization left leg  08/2009  . Stent revascularization right leg  07/2009     Social History   reports that he quit smoking about 2 years ago. His smoking use included cigarettes. He has a 75.00 pack-year smoking history. He has never used smokeless tobacco. He reports previous alcohol use. He reports that he does not use drugs.   Family History   His family history includes Alcohol abuse in his father; Breast cancer in his sister; Colon cancer in his father; Coronary artery disease in his father and mother; Kidney cancer in his father.   Allergies No Known Allergies   Home Medications  Prior to Admission medications   Medication Sig Start  Date End Date Taking? Authorizing Provider  apixaban (ELIQUIS) 5 MG TABS tablet Take 1 tablet (5 mg total) by mouth 2 (two) times daily. 04/18/19   Patrecia Pour, MD  budesonide-formoterol (SYMBICORT) 160-4.5 MCG/ACT inhaler Inhale 2 puffs into the lungs 2 (two) times daily. Rinse mouth after use 03/17/19   Laverle Hobby, MD  Calcium Carbonate-Vitamin D3 (CALCIUM 600-D) 600-400 MG-UNIT TABS Take 1 tablet by mouth daily.    [provider]  Cholecalciferol (VITAMIN D3) 25 MCG (1000 UT) CAPS Take by mouth.    [provider]  cilostazol (PLETAL) 100 MG tablet Take 100 mg by mouth 2 (two) times daily.     [provider]  colchicine 0.6 MG tablet Take 0.6 mg by mouth once.    [provider]  diphenoxylate-atropine (LOMOTIL) 2.5-0.025 MG tablet Take 1 tablet by mouth 4 (four) times daily as needed for diarrhea or loose stools.    [provider]  ezetimibe (ZETIA) 10 MG tablet Take 10 mg by mouth daily.  07/07/18   [provider]  fenofibrate 160 MG tablet Take 160 mg by mouth daily.     [provider]  Insulin Degludec (TRESIBA FLEXTOUCH) 200 UNIT/ML SOPN Inject 12 Units into the skin at bedtime.  05/17/18   [provider]  isosorbide mononitrate (IMDUR) 120 MG 24 hr tablet Take 120 mg by mouth daily.    [provider]  lansoprazole (PREVACID) 30 MG capsule Take 30 mg by mouth every morning.     [provider]  losartan (COZAAR) 25 MG tablet Take 25 mg by mouth daily.  08/29/18   [provider]  lovastatin (MEVACOR) 40 MG tablet Take 40 mg by mouth 2 (two) times daily.  07/01/18   [provider]  meclizine (ANTIVERT) 12.5 MG tablet Take 1 tablet (12.5 mg total) by mouth 3 (three) times daily as needed for dizziness. 04/05/19   Nena Polio, MD  metoprolol tartrate (LOPRESSOR) 100 MG tablet Take 1 tablet (100 mg total) by mouth 2 (two) times daily. 04/18/19   Patrecia Pour, MD   nitroGLYCERIN (NITROSTAT) 0.4 MG SL tablet Place 0.4 mg under the tongue every 5 (five) minutes as needed for chest pain.  07/01/18   [provider]  sodium bicarbonate 650 MG tablet Take 1 tablet by mouth daily.  12/31/18   Kolluru, Lurena Nida, MD  ticagrelor (BRILINTA) 60 MG TABS tablet Take 60 mg by mouth 2 (two) times daily.     [provider]     Critical care time: 50 min     Georgann Housekeeper, AGACNP-BC [place] Pager [phone] or [phone]  05/07/2019 10:46 AM  Attending Note:  68 year old male with diagnosis of COVID 19 3 weeks PTP who presents to [facility] with seizure who was in the ED in [facility] when he reportedly had a seizure followed by a cardiac arrest vs spontaneous cardiac arrest.  Given persistent positive COVID status patient was not admitted to [facility] and PCCM was called to accept on transfer.  Neurology wanted patient in [facility] for EEG and neurology to evaluate.  Upon presentation patient was agitated, moving all ext spontaneously with coarse BS diffusely.  I reviewed CXR myself, ETT is in a good position and diffuse infiltrate noted.  Discussed with PCCM-NP.  Cardiac arrest:             - Heparin drip             -  Amiodarone drip             - Cardiology consulted             - Tele monitoring             - EKG             - Echo             - No TTM due to sepsis and anti-coagulation  Seizure:             - Neuro consulted             - Will defer treatment to neurology             - Precedex and fentanyl drip             - Versed PRN for seizure if recurs  Hypotension:             - TLC placement             - Levophed drip             - IVF             - CVP  VDRF:             - Full vent support             - ABG now and in AM             - Adjust vent for ABG             - CXR now and in AM  A-fib:  - Heparin drip  - Hold eliquis  - Cards to see  COVID-19 infection:  - Not an active infection at this  point  - No COVID specific treatment  CK:  - IV hydration  - Check CVP  - Attempt to limit pressor use  - BMET in AM  - Replace electrolytes as indicated   CRITICAL CARE Performed by: Corey Harold   Total critical care time: 45 minutes  Critical care time was exclusive of separately billable procedures and treating other patients.  Critical care was necessary to treat or prevent imminent or life-threatening deterioration.  Critical care was time spent personally by me on the following activities: development of treatment plan with patient and/or surrogate as well as nursing, discussions with consultants, evaluation of patient's response to treatment, examination of patient, obtaining history from patient or surrogate, ordering and performing treatments and interventions, ordering and review of laboratory studies, ordering and review of radiographic studies, pulse oximetry and re-evaluation of patient's condition.   Georgann Housekeeper, AGACNP-BC [place] Pager [phone] or [phone]  05/07/2019 11:23 AM

## 2019-05-07 NOTE — Progress Notes (Signed)
ANTICOAGULATION CONSULT NOTE   Pharmacy Consult for Heparin  Indication: pulmonary embolus  No Known Allergies  Patient Measurements: Height: 5\' 11"  (180.3 cm) Weight: 200 lb 9.9 oz (91 kg) IBW/kg (Calculated) : 75.3 Heparin Dosing Weight:  96.2 kg   Vital Signs: Temp: 97.4 F (36.3 C) (10/08 1200) Temp Source: Oral (10/08 1200) BP: 87/48 (10/08 1300) Pulse Rate: 52 (10/08 1300)  Labs: Recent Labs    05/05/19 1832  05/05/19 2114 05/06/19 0552 05/06/19 1205  05/06/19 2052 05/07/19 0342 05/07/19 0400 05/07/19 1439  HGB 12.5*  --   --   --  11.5*   < > 12.0* 12.3* 15.0  --   HCT 41.1  --   --   --  40.4   < > 37.4* 40.3 44.0  --   PLT 353  --   --   --  244  --  342 357  --   --   APTT  --    < > 31 42* 64*  --  118* 122*  --  67*  LABPROT  --   --  17.3*  --   --   --  18.0*  --   --   --   INR  --   --  1.4*  --   --   --  1.5*  --   --   --   HEPARINUNFRC  --   --   --  0.61  --   --  0.87* 0.97*  --   --   CREATININE 3.06*  --   --   --  2.83*  --  3.63* 3.81*  --  4.62*  TROPONINIHS 29*  --  167*  --  243*  --   --   --   --   --    < > = values in this interval not displayed.    Estimated Creatinine Clearance: 18.4 mL/min (A) (by C-G formula based on SCr of 4.62 mg/dL (H)).  Assessment:  69 year old male presented with PE; transfer from [facility] - recent covid-19 in sept. LE Dopplers negative for DVT on 10/8. Pt was on apixaban PTA, uncertain when last dose was given.   CrCl = 28.9 ml/min  Monitoring aPTT as not correlating with hep lvl  APTT WNL at 67 after holding heparin for 1 hour this AM and restarting at lower dose. Hgb improved from 12 to 15 this AM. Platelets remain WNL. Given high APTT and minimal oozing at central line site this AM, will continue current dose and check confirmatory 8-hr APTT.   Goal of Therapy:  APTT = 66 - 102  Heparin level 0.3-0.7 units/ml once heparin level and aPTT correlate.  Monitor platelets by anticoagulation protocol: Yes    Plan:  Continue heparin at 1600 units/hr Check confirmatory 8-hr APTT Daily HL and APTT until levels correlate Monitor bleeding and CBC  Richardine Service, PharmD PGY1 Pharmacy Resident Phone: [phone] 05/07/2019  5:00 PM  Please check [email or web address] for unit-specific pharmacy phone numbers.

## 2019-05-07 NOTE — Progress Notes (Signed)
   Chart reviewed - appears he has been extubated. On heparin and amiodarone. Previous LVEF 35-40% in 02/2019, however, repeat echo ordered and not yet performed. Although it appears he had recent active COVID pneumonia in September, he has again tested positive for COVID-19 - PCCM does not suspect this is a new active infection, however, we would require negative testing to proceed with any invasive procedures such as cath if warranted. Additionally, however, worsening renal function will also preclude any invasive work-up. The echo will be helpful to further risk stratify.   Will re-evaluate tomorrow after echo.  [name], MD, [facility], [place] Director of the Advanced Lipid Disorders &  Cardiovascular Risk Reduction Clinic Diplomate of the American Board of Clinical Lipidology Attending Cardiologist  Direct Dial: [phone]  Fax: [phone]  Website:  [email or web address] Dylan Goodman 05/07/2019, 10:35 AM

## 2019-05-07 NOTE — Progress Notes (Signed)
Initial Nutrition Assessment RD working remotely.  DOCUMENTATION CODES:   Not applicable  INTERVENTION:    Vital AF 1.2 at 65 ml/h (1560 ml per day)   Pro-stat 30 ml BID   Provides 2072 kcal, 147 gm protein, 1265 ml free water daily  NUTRITION DIAGNOSIS:   Inadequate oral intake related to inability to eat as evidenced by NPO status.  GOAL:   Patient will meet greater than or equal to 90% of their needs  MONITOR:   Vent status, TF tolerance, Labs  REASON FOR ASSESSMENT:   Ventilator, Consult Enteral/tube feeding initiation and management  ASSESSMENT:   68 yo male admitted with seizure and brief cardiac arrest in the [facility] ED. Required intubation and was transferred to [facility]. 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   Received MD Consult for TF initiation and management. OGT in place.   Patient is currently intubated on ventilator support MV: 8.5 L/min Temp (24hrs), Avg:98.9 F (37.2 C), Min:98.2 F (36.8 C), Max:99.5 F (37.5 C)   Labs reviewed. BUN 42 (H), creatinine 3.81 (H), phosphorus 5.8 (H) CBG's: 201-133  Medications reviewed and include solumedrol, levophed.   Per review of weight encounters, patient has lost 7% of usual weight within the past month, which is significant for the time frame. Suspect recent intake has been poor due to recent COVID diagnosis.   NUTRITION - FOCUSED PHYSICAL EXAM:  unable to complete  Diet Order:   Diet Order            Diet NPO time specified  Diet effective now              EDUCATION NEEDS:   Not appropriate for education at this time  Skin:  Skin Assessment: Reviewed RN Assessment  Last BM:  10/7 type 6  Height:   Ht Readings from Last 1 Encounters:  05/06/19 5\' 11"  (1.803 m)    Weight:   Wt Readings from Last 1 Encounters:  05/07/19 91 kg    Ideal Body Weight:  78.2  kg  BMI:  Body mass index is 27.98 kg/m.  Estimated Nutritional Needs:   Kcal:  1970  Protein:  130-150 gm  Fluid:  >/= 2 L    Molli Barrows, RD, LDN, [place] Pager [phone] After Hours Pager [phone]

## 2019-05-07 NOTE — Progress Notes (Signed)
PHARMACY NOTE:  ANTIMICROBIAL RENAL DOSAGE ADJUSTMENT  Current antimicrobial regimen includes a mismatch between antimicrobial dosage and estimated renal function.  As per policy approved by the Pharmacy & Therapeutics and Medical Executive Committees, the antimicrobial dosage will be adjusted accordingly.  Current antimicrobial dosage:  Zosyn 3.375 gm IV Q 8 hrs  Indication:  Pneumonia  Renal Function:  Estimated Creatinine Clearance: 18.4 mL/min (A) (by C-G formula based on SCr of 4.62 mg/dL (H)). []      On intermittent HD, scheduled: []      On CRRT    Antimicrobial dosage has been changed to:  Zosyn 3.375 gm IV Q 12 hrs (CrCl <20 ml/min)  Additional comments: Pharmacy will monitor pt's renal function and adjust Zosyn dosing as needed   Thank you for allowing pharmacy to be a part of this patient's care.  Gillermina Hu, PharmD, BCPS, [facility] Clinical Pharmacist 05/07/2019 8:40 PM

## 2019-05-07 NOTE — Progress Notes (Signed)
PM rounds  - doing stable on 3-4 LNC post extubation  - talking - RN says no issues post extubation - PM labs (below) shows worsening renal failure (4.6, baseline 2.9) and non-gap acidois - bic 15- - shock liver + probably due to arrest - echo pending (baseline ef 35%)   Plan = check abg  - do bipap qhs (emphysema on CT and als hypercarbia this admit) - do bd  - give fluid bolus 500cc - check bmet 05/08/19 and lft - check tylenol and salicylate level 1:61 PM  - can move to progressive - continue zosyn (PCT high)  TRH primary from 05/08/19 - ccm available based on needs     SIGNATURE    Dr. Brand Males, M.D., F.C.C.P,  Pulmonary and Critical Care Medicine Staff Physician, [place] Director - Interstitial Lung Disease  Program  [facility] [place], [place], [address]  Pager: [phone], If no answer or between  15:00h - 7:00h: call [phone] Telephone: [phone]  5:48 PM 05/07/2019     LABS    PULMONARY Recent Labs  Lab 05/06/19 1301 05/06/19 1725 05/07/19 0400  PHART 6.91* 7.254* 7.225*  PCO2ART 53* 34.6 29.3*  PO2ART 220* 139.0* 157.0*  HCO3 10.6* 15.4* 12.1*  TCO2  --  16* 13*  O2SAT 99.3 99.0 99.0    CBC Recent Labs  Lab 05/06/19 1205  05/06/19 2052 05/07/19 0342 05/07/19 0400  HGB 11.5*   < > 12.0* 12.3* 15.0  HCT 40.4   < > 37.4* 40.3 44.0  WBC 4.7  --  11.1* 9.5  --   PLT 244  --  342 357  --    < > = values in this interval not displayed.    COAGULATION Recent Labs  Lab 05/05/19 2114 05/06/19 2052  INR 1.4* 1.5*    CARDIAC  No results for input(s): TROPONINI in the last 168 hours. No results for input(s): PROBNP in the last 168 hours.   CHEMISTRY Recent Labs  Lab 05/05/19 1832 05/06/19 1205 05/06/19 1725 05/06/19 2052 05/07/19 0342 05/07/19 0400 05/07/19 1439  NA 138 141 140 139 140 135 141  K 3.8 4.1 3.8 3.7 4.8 4.5 4.7   CL 105 112*  --  110 109  --  112*  CO2 11* 10*  --  14* 13*  --  15*  GLUCOSE 198* 246*  --  146* 172*  --  141*  BUN 44* 39*  --  42* 42*  --  44*  CREATININE 3.06* 2.83*  --  3.63* 3.81*  --  4.62*  CALCIUM 7.1* 6.3*  --  6.2* 6.8*  --  6.5*  MG  --  1.9  --  1.1* 2.2  --   --   PHOS  --   --   --  5.6* 5.8*  --   --    Estimated Creatinine Clearance: 18.4 mL/min (A) (by C-G formula based on SCr of 4.62 mg/dL (H)).   LIVER Recent Labs  Lab 05/05/19 1832 05/05/19 2114 05/06/19 1205 05/06/19 2052  AST 44*  --  628* 1,197*  ALT 22  --  329* 376*  ALKPHOS 62  --  61 91  BILITOT 0.7  --  0.6 0.9  PROT 6.8  --  5.3* 5.5*  ALBUMIN 3.1*  --  2.4* 2.4*  INR  --  1.4*  --  1.5*  INFECTIOUS Recent Labs  Lab 05/05/19 2114 05/06/19 2052 05/07/19 0020  LATICACIDVEN 1.2 1.1 0.9  PROCALCITON 0.14 14.10  --      ENDOCRINE CBG (last 3)  Recent Labs    05/07/19 0019 05/07/19 1227 05/07/19 1658  GLUCAP 133* 141* 116*         IMAGING x48h  - image(s) personally visualized  -   highlighted in bold Dg Abdomen 1 View  Result Date: 05/06/2019 CLINICAL DATA:  Nasogastric tube placement. EXAM: ABDOMEN - 1 VIEW COMPARISON:  None. FINDINGS: Nasogastric tube terminates just beyond the gastroesophageal junction with the side port in the lower esophagus. IMPRESSION: Nasogastric tube terminates just beyond the gastroesophageal junction, with the side port in the distal esophagus. Advancing approximately 10-15 cm would better position the tip within the stomach. Electronically Signed   By: Lorin Picket M.D.   On: 05/06/2019 13:41   Ct Head Wo Contrast  Result Date: 05/05/2019 CLINICAL DATA:  Seizure, covid positive patient EXAM: CT HEAD WITHOUT CONTRAST TECHNIQUE: Contiguous axial images were obtained from the base of the skull through the vertex without intravenous contrast. COMPARISON:  April 12, 2019 FINDINGS: Brain: No evidence of acute territorial infarction,  hemorrhage, hydrocephalus,extra-axial collection or mass lesion/mass effect. Normal gray-white differentiation. There is mild dilatation the ventricles and sulci consistent with age-related atrophy. Vascular: No hyperdense vessel. Calcifications are seen within the internal carotid artery and bilateral vertebral arteries. Skull: The skull is intact. No fracture or focal lesion identified. Sinuses/Orbits: The visualized paranasal sinuses and mastoid air cells are clear. The orbits and globes intact. Other: None IMPRESSION: No acute intracranial abnormality. Findings consistent with age related atrophy Electronically Signed   By: Prudencio Pair M.D.   On: 05/05/2019 19:30   Dg Chest Port 1 View  Result Date: 05/07/2019 CLINICAL DATA:  Endotracheal tube and central line placement. EXAM: PORTABLE CHEST 1 VIEW COMPARISON:  Radiograph of May 06, 2019. FINDINGS: Stable cardiomediastinal silhouette. Endotracheal and nasogastric tubes are unchanged in position. Right internal jugular catheter is unchanged with distal tip in expected position of the SVC. No pneumothorax is noted. Stable bibasilar opacities are noted. No pleural effusion is noted. Bony thorax unremarkable. IMPRESSION: Grossly stable support apparatus. Stable bibasilar opacities as described above. Electronically Signed   By: Marijo Conception M.D.   On: 05/07/2019 07:49   Dg Chest Port 1 View  Result Date: 05/06/2019 CLINICAL DATA:  Check endotracheal tube placement EXAM: PORTABLE CHEST 1 VIEW COMPARISON:  05/06/2019 FINDINGS: Endotracheal tube, gastric catheter and right jugular central line are noted in satisfactory position. No pneumothorax is noted. Patchy bilateral infiltrates are again seen but slightly improved when compared with the prior study. No sizable effusion is noted. No bony abnormality is seen. IMPRESSION: No pneumothorax following central line placement. Tubes and lines as described above. Bilateral patchy infiltrates slightly improved  from the prior study. Electronically Signed   By: Inez Catalina M.D.   On: 05/06/2019 19:46   Dg Chest Portable 1 View  Result Date: 05/06/2019 CLINICAL DATA:  Endotracheal tube placement. EXAM: PORTABLE CHEST 1 VIEW COMPARISON:  05/05/2019 and 05/04/2019. FINDINGS: Endotracheal tube terminates 5.3 cm above the carina. Nasogastric tube is followed into the distal esophagus with the tip projecting beyond the inferior margin of the image. Heart is enlarged, stable. Thoracic aorta is calcified. Apparent worsening in mixed interstitial and airspace opacification may be due in part to lower lung volumes on the current study. Apparent lucency in the periphery of the upper right  hemithorax is unchanged from 05/04/2019 and may be due to bullous emphysema. IMPRESSION: 1. Endotracheal tube is in satisfactory position. 2. Apparent worsening in mixed interstitial and airspace opacification may be due to expiratory phase imaging versus worsening pulmonary edema. Electronically Signed   By: Lorin Picket M.D.   On: 05/06/2019 13:39   Dg Chest Portable 1 View  Result Date: 05/05/2019 CLINICAL DATA:  Shortness of breath, dizziness, and weakness. COVID-19 virus infection. EXAM: PORTABLE CHEST 1 VIEW COMPARISON:  05/04/2019 FINDINGS: Stable mild cardiomegaly. Prior median sternotomy. Underlying chronic interstitial lung disease is again noted. Increased opacity is seen in both lower lobes, suspicious for superimposed pneumonia. No evidence of pleural effusion. IMPRESSION: Increased opacity in both lower lobes, suspicious for pneumonia superimposed on underlying chronic interstitial lung disease. Stable mild cardiomegaly. Electronically Signed   By: Marlaine Hind M.D.   On: 05/05/2019 19:20   Vas [place] Lower Extremity Venous (dvt)  Result Date: 05/07/2019  Lower Venous Study Indications: COVID positive.  Limitations: Posterior acoustic shadowing from arterial calcification. Comparison Study: No prior study. Performing  Technologist: Maudry Mayhew MHA, RDMS, RVT, RDCS  Examination Guidelines: A complete evaluation includes B-mode imaging, spectral Doppler, color Doppler, and power Doppler as needed of all accessible portions of each vessel. Bilateral testing is considered an integral part of a complete examination. Limited examinations for reoccurring indications may be performed as noted.  +---------+---------------+---------+-----------+----------+--------------+ RIGHT    CompressibilityPhasicitySpontaneityPropertiesThrombus Aging +---------+---------------+---------+-----------+----------+--------------+ CFV      Full                                                        +---------+---------------+---------+-----------+----------+--------------+ FV Prox  Full                                                        +---------+---------------+---------+-----------+----------+--------------+ FV DistalFull                                                        +---------+---------------+---------+-----------+----------+--------------+ POP      Full                                                        +---------+---------------+---------+-----------+----------+--------------+ PTV      Full                                                        +---------+---------------+---------+-----------+----------+--------------+ PERO     Full                                                        +---------+---------------+---------+-----------+----------+--------------+  +---------+---------------+---------+-----------+----------+--------------+  LEFT     CompressibilityPhasicitySpontaneityPropertiesThrombus Aging +---------+---------------+---------+-----------+----------+--------------+ CFV      Full           Yes      Yes                                 +---------+---------------+---------+-----------+----------+--------------+ SFJ      Full                                                         +---------+---------------+---------+-----------+----------+--------------+ FV Prox  Full                                                        +---------+---------------+---------+-----------+----------+--------------+ FV DistalFull                                                        +---------+---------------+---------+-----------+----------+--------------+ POP      Full           Yes      Yes                                 +---------+---------------+---------+-----------+----------+--------------+ PTV      Full                                                        +---------+---------------+---------+-----------+----------+--------------+ PERO     Full                                                        +---------+---------------+---------+-----------+----------+--------------+  Summary: Right: There is no evidence of deep vein thrombosis in the lower extremity. However, portions of this examination were limited- see technologist comments above. No cystic structure found in the popliteal fossa. Left: There is no evidence of deep vein thrombosis in the lower extremity. However, portions of this examination were limited- see technologist comments above. No cystic structure found in the popliteal fossa.  *See table(s) above for measurements and observations. Electronically signed by Monica Martinez MD on 05/07/2019 at 5:07:11 PM.    Final    Anti-infectives (From admission, onward)   Start     Dose/Rate Route Frequency Ordered Stop   05/07/19 2200  vancomycin (VANCOCIN) 1,750 mg in sodium chloride 0.9 % 500 mL IVPB  Status:  Discontinued     1,750 mg 250 mL/hr over 120 Minutes Intravenous Every 48 hours 05/06/19 1912 05/07/19 1039   05/07/19 2200  vancomycin (VANCOCIN) 1,500 mg in sodium chloride 0.9 % 500 mL IVPB  Status:  Discontinued  1,500 mg 250 mL/hr over 120 Minutes Intravenous Every 48 hours 05/07/19 1039 05/07/19 1228    05/07/19 0200  piperacillin-tazobactam (ZOSYN) IVPB 3.375 g     3.375 g 12.5 mL/hr over 240 Minutes Intravenous Every 8 hours 05/06/19 1853     05/06/19 1930  piperacillin-tazobactam (ZOSYN) IVPB 3.375 g     3.375 g 100 mL/hr over 30 Minutes Intravenous  Once 05/06/19 1853 05/06/19 2156

## 2019-05-08 ENCOUNTER — Encounter (HOSPITAL_COMMUNITY): Payer: Self-pay

## 2019-05-08 ENCOUNTER — Other Ambulatory Visit: Payer: Self-pay

## 2019-05-08 ENCOUNTER — Inpatient Hospital Stay (HOSPITAL_COMMUNITY): Payer: Medicare Other

## 2019-05-08 DIAGNOSIS — I480 Paroxysmal atrial fibrillation: Secondary | ICD-10-CM

## 2019-05-08 DIAGNOSIS — I469 Cardiac arrest, cause unspecified: Secondary | ICD-10-CM | POA: Diagnosis not present

## 2019-05-08 DIAGNOSIS — I340 Nonrheumatic mitral (valve) insufficiency: Secondary | ICD-10-CM

## 2019-05-08 DIAGNOSIS — R9431 Abnormal electrocardiogram [ECG] [EKG]: Secondary | ICD-10-CM

## 2019-05-08 DIAGNOSIS — I361 Nonrheumatic tricuspid (valve) insufficiency: Secondary | ICD-10-CM | POA: Diagnosis not present

## 2019-05-08 LAB — GLUCOSE, CAPILLARY
Glucose-Capillary: 173 mg/dL — ABNORMAL HIGH (ref 70–99)
Glucose-Capillary: 180 mg/dL — ABNORMAL HIGH (ref 70–99)
Glucose-Capillary: 245 mg/dL — ABNORMAL HIGH (ref 70–99)
Glucose-Capillary: 92 mg/dL (ref 70–99)
Glucose-Capillary: 97 mg/dL (ref 70–99)

## 2019-05-08 LAB — PROTIME-INR
INR: 1.5 — ABNORMAL HIGH (ref 0.8–1.2)
Prothrombin Time: 17.5 seconds — ABNORMAL HIGH (ref 11.4–15.2)

## 2019-05-08 LAB — CBC
HCT: 33.3 % — ABNORMAL LOW (ref 39.0–52.0)
Hemoglobin: 10 g/dL — ABNORMAL LOW (ref 13.0–17.0)
MCH: 29.1 pg (ref 26.0–34.0)
MCHC: 30 g/dL (ref 30.0–36.0)
MCV: 96.8 fL (ref 80.0–100.0)
Platelets: 230 10*3/uL (ref 150–400)
RBC: 3.44 MIL/uL — ABNORMAL LOW (ref 4.22–5.81)
RDW: 15.7 % — ABNORMAL HIGH (ref 11.5–15.5)
WBC: 8.8 10*3/uL (ref 4.0–10.5)
nRBC: 0 % (ref 0.0–0.2)

## 2019-05-08 LAB — LEGIONELLA PNEUMOPHILA SEROGP 1 UR AG: L. pneumophila Serogp 1 Ur Ag: NEGATIVE

## 2019-05-08 LAB — PHOSPHORUS: Phosphorus: 5.2 mg/dL — ABNORMAL HIGH (ref 2.5–4.6)

## 2019-05-08 LAB — HEPARIN LEVEL (UNFRACTIONATED): Heparin Unfractionated: 0.43 IU/mL (ref 0.30–0.70)

## 2019-05-08 LAB — HEPATIC FUNCTION PANEL
ALT: 187 U/L — ABNORMAL HIGH (ref 0–44)
AST: 164 U/L — ABNORMAL HIGH (ref 15–41)
Albumin: 2.2 g/dL — ABNORMAL LOW (ref 3.5–5.0)
Alkaline Phosphatase: 61 U/L (ref 38–126)
Bilirubin, Direct: 0.2 mg/dL (ref 0.0–0.2)
Indirect Bilirubin: 0.8 mg/dL (ref 0.3–0.9)
Total Bilirubin: 1 mg/dL (ref 0.3–1.2)
Total Protein: 5.2 g/dL — ABNORMAL LOW (ref 6.5–8.1)

## 2019-05-08 LAB — BASIC METABOLIC PANEL
Anion gap: 15 (ref 5–15)
BUN: 46 mg/dL — ABNORMAL HIGH (ref 8–23)
CO2: 13 mmol/L — ABNORMAL LOW (ref 22–32)
Calcium: 6.6 mg/dL — ABNORMAL LOW (ref 8.9–10.3)
Chloride: 110 mmol/L (ref 98–111)
Creatinine, Ser: 4.99 mg/dL — ABNORMAL HIGH (ref 0.61–1.24)
GFR calc Af Amer: 13 mL/min — ABNORMAL LOW (ref >60)
GFR calc non Af Amer: 11 mL/min — ABNORMAL LOW (ref >60)
Glucose, Bld: 100 mg/dL — ABNORMAL HIGH (ref 70–99)
Potassium: 4 mmol/L (ref 3.5–5.1)
Sodium: 138 mmol/L (ref 135–145)

## 2019-05-08 LAB — ECHOCARDIOGRAM COMPLETE
Height: 71 in
Weight: 3305.14 oz

## 2019-05-08 LAB — MAGNESIUM: Magnesium: 1.7 mg/dL (ref 1.7–2.4)

## 2019-05-08 LAB — APTT: aPTT: 84 seconds — ABNORMAL HIGH (ref 24–36)

## 2019-05-08 MED ORDER — DILTIAZEM HCL-DEXTROSE 125-5 MG/125ML-% IV SOLN (PREMIX)
5.0000 mg/h | INTRAVENOUS | Status: DC
  Administered 2019-05-08: 5 mg/h via INTRAVENOUS
  Administered 2019-05-09 – 2019-05-11 (×8): 15 mg/h via INTRAVENOUS
  Filled 2019-05-08 (×12): qty 125

## 2019-05-08 MED ORDER — MAGNESIUM SULFATE 2 GM/50ML IV SOLN
2.0000 g | Freq: Once | INTRAVENOUS | Status: AC
  Administered 2019-05-08: 2 g via INTRAVENOUS
  Filled 2019-05-08: qty 50

## 2019-05-08 MED ORDER — PANTOPRAZOLE SODIUM 40 MG PO TBEC
40.0000 mg | DELAYED_RELEASE_TABLET | Freq: Every day | ORAL | Status: DC
  Administered 2019-05-09 – 2019-05-19 (×11): 40 mg via ORAL
  Filled 2019-05-08 (×11): qty 1

## 2019-05-08 MED ORDER — HYDROCORTISONE NA SUCCINATE PF 100 MG IJ SOLR
50.0000 mg | Freq: Two times a day (BID) | INTRAMUSCULAR | Status: DC
  Administered 2019-05-08 – 2019-05-10 (×4): 50 mg via INTRAVENOUS
  Filled 2019-05-08 (×4): qty 2

## 2019-05-08 MED ORDER — GLUCERNA SHAKE PO LIQD
237.0000 mL | Freq: Two times a day (BID) | ORAL | Status: DC
  Administered 2019-05-08 – 2019-05-15 (×11): 237 mL via ORAL
  Filled 2019-05-08 (×2): qty 237

## 2019-05-08 MED ORDER — GELATIN ABSORBABLE 12-7 MM EX MISC
1.0000 | Freq: Once | CUTANEOUS | Status: AC
  Administered 2019-05-08: 1 via TOPICAL
  Filled 2019-05-08: qty 1

## 2019-05-08 MED ORDER — METOPROLOL TARTRATE 25 MG PO TABS
25.0000 mg | ORAL_TABLET | Freq: Two times a day (BID) | ORAL | Status: DC
  Administered 2019-05-08 – 2019-05-11 (×6): 25 mg via ORAL
  Filled 2019-05-08 (×7): qty 1

## 2019-05-08 MED ORDER — STERILE WATER FOR INJECTION IV SOLN
INTRAVENOUS | Status: DC
  Administered 2019-05-08 – 2019-05-09 (×3): via INTRAVENOUS
  Filled 2019-05-08 (×4): qty 850

## 2019-05-08 MED ORDER — PIPERACILLIN-TAZOBACTAM 3.375 G IVPB
3.3750 g | Freq: Two times a day (BID) | INTRAVENOUS | Status: DC
  Administered 2019-05-08 (×2): 3.375 g via INTRAVENOUS
  Filled 2019-05-08 (×3): qty 50

## 2019-05-08 MED ORDER — METOPROLOL TARTRATE 5 MG/5ML IV SOLN
5.0000 mg | INTRAVENOUS | Status: AC
  Administered 2019-05-08: 5 mg via INTRAVENOUS
  Filled 2019-05-08: qty 5

## 2019-05-08 MED ORDER — DILTIAZEM LOAD VIA INFUSION
10.0000 mg | Freq: Once | INTRAVENOUS | Status: AC
  Administered 2019-05-08: 10 mg via INTRAVENOUS
  Filled 2019-05-08: qty 10

## 2019-05-08 MED ORDER — ZOLPIDEM TARTRATE 5 MG PO TABS
5.0000 mg | ORAL_TABLET | Freq: Once | ORAL | Status: AC
  Administered 2019-05-08: 5 mg via ORAL
  Filled 2019-05-08: qty 1

## 2019-05-08 MED ORDER — IPRATROPIUM-ALBUTEROL 20-100 MCG/ACT IN AERS
1.0000 | INHALATION_SPRAY | Freq: Four times a day (QID) | RESPIRATORY_TRACT | Status: DC
  Administered 2019-05-08 – 2019-05-16 (×31): 1 via RESPIRATORY_TRACT
  Filled 2019-05-08: qty 4

## 2019-05-08 MED ORDER — LEVETIRACETAM 500 MG PO TABS
500.0000 mg | ORAL_TABLET | Freq: Two times a day (BID) | ORAL | Status: DC
  Administered 2019-05-08 – 2019-05-20 (×24): 500 mg via ORAL
  Filled 2019-05-08 (×27): qty 1

## 2019-05-08 MED ORDER — ALBUTEROL SULFATE HFA 108 (90 BASE) MCG/ACT IN AERS
1.0000 | INHALATION_SPRAY | RESPIRATORY_TRACT | Status: DC | PRN
  Administered 2019-05-19 (×2): 1 via RESPIRATORY_TRACT
  Filled 2019-05-08: qty 6.7

## 2019-05-08 NOTE — Evaluation (Signed)
Physical Therapy Evaluation Patient Details Name: Dylan Goodman MRN: [id number] DOB: Feb 24, 1951 Today's Date: 05/08/2019   History of Present Illness  Pt is a 68 yo male s/p recent hospitalization 9/13 for COVID+ PNA. THis admission due to dizziness, witnessed 2-minute tonic/clonic type seizure. COVID Test again positive. 10/7 when he abruptly developed respiratory distress and tachycardia requiring CPR. 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  Clinical Impression  PTA pt independent in mobility, ADLs and iADLs. Pt currently limited in safe mobility by SoB with ambulation and oxygen dependence in presence of decreased strength and endurance. Pt is currently min A for bed mobility, transfers and ambulation of 10 feet with RW. PT recommending initial 24 hour assist for safety and HHPT level rehab. PT will continue to follow acutely.     Follow Up Recommendations Home health PT;Supervision/Assistance - 24 hour(initially)    Equipment Recommendations  None recommended by PT       Precautions / Restrictions Precautions Precautions: Fall;Other (comment) Precaution Comments: watch O2 Restrictions Weight Bearing Restrictions: No      Mobility  Bed Mobility Overal bed mobility: Needs Assistance Bed Mobility: Sidelying to Sit   Sidelying to sit: Min assist       General bed mobility comments: for trunk elevation  Transfers Overall transfer level: Needs assistance Equipment used: Rolling walker (2 wheeled) Transfers: Sit to/from Stand Sit to Stand: Min assist         General transfer comment: cues for safe hadn placement  Ambulation/Gait Ambulation/Gait assistance: Min assist Gait Distance (Feet): 10 Feet Assistive device: Rolling walker (2 wheeled) Gait Pattern/deviations: Step-through pattern;Decreased stride length;Wide base of support Gait velocity:  slowed Gait velocity interpretation: <1.31 ft/sec, indicative of household ambulator General Gait Details: minA for steadying, mild instability no overt loss of balance c/o chest pain with B UE support on RW, possible residual from CPR, seems to be movement dependent      Balance Overall balance assessment: Needs assistance   Sitting balance-Leahy Scale: Good     Standing balance support: No upper extremity supported Standing balance-Leahy Scale: Fair                               Pertinent Vitals/Pain Pain Assessment: No/denies pain    Home Living Family/patient expects to be discharged to:: Private residence Living Arrangements: Spouse/significant other;Children Available Help at Discharge: Family;Available 24 hours/day Type of Home: House Home Access: Level entry     Home Layout: Two level;Bed/bath upstairs Home Equipment: Grab bars - toilet;Walker - 2 wheels      Prior Function Level of Independence: Independent               Hand Dominance   Dominant Hand: Right    Extremity/Trunk Assessment   Upper Extremity Assessment Upper Extremity Assessment: Defer to OT evaluation    Lower Extremity Assessment Lower Extremity Assessment: Generalized weakness    Cervical / Trunk Assessment Cervical / Trunk Assessment: Normal  Communication   Communication: HOH  Cognition Arousal/Alertness: Awake/alert Behavior During Therapy: WFL for tasks assessed/performed Overall Cognitive Status: Within Functional Limits for tasks assessed                                        General Comments  General comments (skin integrity, edema, etc.): O2 >90% On 6L. Pt requires cues to peform pursed lip breating techniques,        Assessment/Plan    PT Assessment Patient needs continued PT services  PT Problem List Decreased strength;Decreased activity tolerance;Decreased balance;Decreased mobility;Decreased knowledge of use of  DME;Cardiopulmonary status limiting activity       PT Treatment Interventions DME instruction;Gait training;Stair training;Functional mobility training;Therapeutic activities;Therapeutic exercise;Balance training;Patient/family education    PT Goals (Current goals can be found in the Care Plan section)  Acute Rehab PT Goals Patient Stated Goal: to get back home and fish PT Goal Formulation: With patient Time For Goal Achievement: 05/22/19 Potential to Achieve Goals: Good    Frequency Min 3X/week   Barriers to discharge Decreased caregiver support         AM-PAC PT "6 Clicks" Mobility  Outcome Measure Help needed turning from your back to your side while in a flat bed without using bedrails?: None Help needed moving from lying on your back to sitting on the side of a flat bed without using bedrails?: A Little Help needed moving to and from a bed to a chair (including a wheelchair)?: A Little Help needed standing up from a chair using your arms (e.g., wheelchair or bedside chair)?: A Little Help needed to walk in hospital room?: A Little Help needed climbing 3-5 steps with a railing? : A Lot 6 Click Score: 18    End of Session Equipment Utilized During Treatment: Gait belt;Oxygen Activity Tolerance: Patient tolerated treatment well Patient left: in bed;Other (comment)(transferring from 74M to 2W) Nurse Communication: Mobility status PT Visit Diagnosis: Unsteadiness on feet (R26.81);Muscle weakness (generalized) (M62.81)    Time: 1109-1130 PT Time Calculation (min) (ACUTE ONLY): 21 min   Charges:   PT Evaluation $PT Eval Moderate Complexity: 1 Mod          Sible Straley B. Migdalia Dk PT, DPT Acute Rehabilitation Services Pager [phone] Office [phone]   [place] 05/08/2019, 4:15 PM

## 2019-05-08 NOTE — Progress Notes (Signed)
ANTICOAGULATION CONSULT NOTE   Pharmacy Consult for Heparin  Indication: pulmonary embolus  No Known Allergies  Patient Measurements: Height: 5\' 11"  (180.3 cm) Weight: 206 lb 9.1 oz (93.7 kg) IBW/kg (Calculated) : 75.3 Heparin Dosing Weight:  96.2 kg   Vital Signs: Temp: 97.9 F (36.6 C) (10/09 0800) Temp Source: Oral (10/09 0800) BP: 132/68 (10/09 0800) Pulse Rate: 78 (10/09 0800)  Labs: Recent Labs    05/05/19 1832 05/05/19 2114  05/06/19 1205  05/06/19 2052 05/07/19 0342 05/07/19 0400 05/07/19 1439 05/07/19 1801 05/08/19 0004 05/08/19 0344  HGB 12.5*  --   --  11.5*   < > 12.0* 12.3* 15.0  --  9.9*  --  10.0*  HCT 41.1  --   --  40.4   < > 37.4* 40.3 44.0  --  29.0*  --  33.3*  PLT 353  --   --  244  --  342 357  --   --   --   --  230  APTT  --  31   < > 64*  --  118* 122*  --  67*  --   --  84*  LABPROT  --  17.3*  --   --   --  18.0*  --   --   --   --   --  17.5*  INR  --  1.4*  --   --   --  1.5*  --   --   --   --   --  1.5*  HEPARINUNFRC  --   --    < >  --   --  0.87* 0.97*  --   --   --   --  0.43  CREATININE 3.06*  --   --  2.83*  --  3.63* 3.81*  --  4.62*  --  4.99*  --   TROPONINIHS 29* 167*  --  243*  --   --   --   --   --   --   --   --    < > = values in this interval not displayed.    Estimated Creatinine Clearance: 17.3 mL/min (A) (by C-G formula based on SCr of 4.99 mg/dL (H)).  Assessment: 68 year old male presented with PE; transfer from [facility] - recent covid-19 in sept. LE Dopplers negative for DVT on 10/8. Pt was on apixaban PTA, uncertain when last dose was given.   CrCl = 17.3 ml/min  Heparin level (0.43) and aPTT (84) WNL and correlating this AM. Hgb and platelets trend down from yesterday. No bleeding or infusion issues per RN. Will continue current dose and stop monitoring aPTT levels from now on since levels are correlating.  Goal of Therapy:  APTT = 66 - 102  Heparin level 0.3-0.7 units/ml once heparin level and aPTT correlate.   Monitor platelets by anticoagulation protocol: Yes   Plan:  Continue heparin at 1600 units/hr Stop monitoring aPTT Daily HL and CBC Monitor bleeding  Richardine Service, PharmD PGY1 Pharmacy Resident Phone: [phone] 05/08/2019  10:27 AM  Please check [email or web address] for unit-specific pharmacy phone numbers.

## 2019-05-08 NOTE — Progress Notes (Signed)
  Echocardiogram 2D Echocardiogram has been performed.  Dylan Goodman 05/08/2019, 9:39 AM

## 2019-05-08 NOTE — Progress Notes (Signed)
Patient remains at a MEWS score of 2 after transfer from ICU to PCU due to respirations.

## 2019-05-08 NOTE — Evaluation (Signed)
Occupational Therapy Evaluation Patient Details Name: Dylan Goodman MRN: [id number] DOB: 10-Jan-1951 Today's Date: 05/08/2019    History of Present Illness Pt is a 68 yo male s/p recent hospitalization 9/13 for COVID+ PNA. THis admission due to dizziness, witnessed 2-minute tonic/clonic type seizure. COVID Test again positive. 10/7 when he abruptly developed respiratory distress and tachycardia requiring CPR. 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   Clinical Impression   Pt PTA: pt performing ADL and mobility with no assist. Pt recently married and moved to [facility] from [place]. Pt currently Pt limited by dyspnea, decreased strength and decreased ability to care for self. O2 >90% On 6L. Pt requires cues to perform pursed lip breating techniques for any mobility. Pt reports dizziness with bed mobility, but once in standing, no dizziness resided. Pt ambulating 15' from bed to new bed in hallway with minA overall with RW. Pt requiring minA overall for ADL tasks as pt requires cues to conserve energy.  Pt will benefit from skilled OT to increase their independence and safety with adls and balance to allow discharge to increase to [facility] assuming pt will progress. OT to follow acutely.     Follow Up Recommendations  Home health OT;Supervision/Assistance - 24 hour(initially)    Equipment Recommendations  None recommended by OT    Recommendations for Other Services       Precautions / Restrictions Precautions Precautions: Fall;Other (comment) Precaution Comments: watch O2 Restrictions Weight Bearing Restrictions: No      Mobility Bed Mobility Overal bed mobility: Needs Assistance Bed Mobility: Sidelying to Sit   Sidelying to sit: Min assist       General bed mobility comments: for trunk elevation  Transfers Overall transfer level: Needs assistance Equipment used: Rolling walker (2  wheeled) Transfers: Sit to/from Stand Sit to Stand: Min assist         General transfer comment: cues for safe hadn placement    Balance Overall balance assessment: Needs assistance   Sitting balance-Leahy Scale: Good     Standing balance support: No upper extremity supported Standing balance-Leahy Scale: Fair                             ADL either performed or assessed with clinical judgement   ADL Overall ADL's : Needs assistance/impaired Eating/Feeding: Modified independent;Sitting   Grooming: Supervision/safety;Standing   Upper Body Bathing: Set up;Sitting   Lower Body Bathing: Minimal assistance;Sitting/lateral leans;Sit to/from stand   Upper Body Dressing : Set up;Sitting   Lower Body Dressing: Minimal assistance;Sitting/lateral leans;Sit to/from stand   Toilet Transfer: Minimal assistance;RW;BSC   Toileting- Clothing Manipulation and Hygiene: Minimal assistance;Cueing for safety;Sitting/lateral lean;Sit to/from stand       Functional mobility during ADLs: Minimal assistance;Rolling walker;Cueing for safety General ADL Comments: Pt limited by dyspnea, decreased strength and decreased ability to care for self.     Vision Baseline Vision/History: No visual deficits Vision Assessment?: No apparent visual deficits     Perception     Praxis      Pertinent Vitals/Pain Pain Assessment: No/denies pain     Hand Dominance Right   Extremity/Trunk Assessment Upper Extremity Assessment Upper Extremity Assessment: Generalized weakness   Lower Extremity Assessment Lower Extremity Assessment: Defer to PT evaluation;Generalized weakness   Cervical / Trunk Assessment Cervical / Trunk Assessment: Normal   Communication Communication Communication: [facility]  Cognition Arousal/Alertness: Awake/alert Behavior During Therapy: WFL for tasks assessed/performed Overall Cognitive Status: Within Functional Limits for tasks assessed                                      General Comments  O2 >90% On 6L. Pt requires cues to peform pursed lip breating techniques,    Exercises     Shoulder Instructions      Home Living Family/patient expects to be discharged to:: Private residence Living Arrangements: Spouse/significant other;Children Available Help at Discharge: Family;Available 24 hours/day Type of Home: House Home Access: Level entry     Home Layout: Two level;Bed/bath upstairs Alternate Level Stairs-Number of Steps: flight, 16 steps Alternate Level Stairs-Rails: Right Bathroom Shower/Tub: Tub/shower unit;Curtain   Bathroom Toilet: Standard Bathroom Accessibility: Yes   Home Equipment: Grab bars - toilet;Walker - 2 wheels          Prior Functioning/Environment Level of Independence: Independent                 OT Problem List: Decreased strength;Decreased activity tolerance;Pain      OT Treatment/Interventions: Self-care/ADL training;Therapeutic exercise;Energy conservation;DME and/or AE instruction;Therapeutic activities;Patient/family education    OT Goals(Current goals can be found in the care plan section) Acute Rehab OT Goals Patient Stated Goal: to get back home and fish OT Goal Formulation: With patient Time For Goal Achievement: 05/22/19 Potential to Achieve Goals: Good ADL Goals Pt Will Perform Upper Body Bathing: with modified independence;standing Pt Will Perform Lower Body Dressing: with modified independence;sit to/from stand Pt Will Perform Toileting - Clothing Manipulation and hygiene: with modified independence;sitting/lateral leans;sit to/from stand Additional ADL Goal #1: Pt will independently state 3 ways to conserve energy for increasing endurance and independence with ADL.  OT Frequency: Min 2X/week   Barriers to D/C:            Co-evaluation              AM-PAC OT "6 Clicks" Daily Activity     Outcome Measure Help from another person eating meals?: None Help from  another person taking care of personal grooming?: A Little Help from another person toileting, which includes using toliet, bedpan, or urinal?: A Little Help from another person bathing (including washing, rinsing, drying)?: A Lot Help from another person to put on and taking off regular upper body clothing?: A Little Help from another person to put on and taking off regular lower body clothing?: A Lot 6 Click Score: 17   End of Session Equipment Utilized During Treatment: Gait belt;Rolling walker;Oxygen  Activity Tolerance: Patient tolerated treatment well Patient left: in bed;with call bell/phone within reach;with bed alarm set;with nursing/sitter in room  OT Visit Diagnosis: Unsteadiness on feet (R26.81);Muscle weakness (generalized) (M62.81)                Time: 2353-6144 OT Time Calculation (min): 32 min Charges:  OT General Charges $OT Visit: 1 Visit OT Evaluation $OT Eval Moderate Complexity: 1 Mod  Darryl Nestle) Marsa Aris OTR/L Acute Rehabilitation Services Pager: [phone] Office: [phone]   Audie Pinto 05/08/2019, 3:40 PM

## 2019-05-08 NOTE — Progress Notes (Signed)
PROGRESS NOTE    Dylan Goodman  HBZ:[id number] DOB: 1950-11-10 DOA: 05/06/2019 PCP: Steele Sizer, MD   Brief Narrative:  Patient is a 68 year old male with history of atrial fibrillation, GI bleed, coronary artery disease status post CABG, nasopharyngeal cancer, cardiomyopathy, chronic depression, CKD stage 4, diabetes mellitus, GI bleed , peripheral vascular disease, previous smoker, recent Covid-19 infection about 3 weeks ago who presents today to [facility] with seizure.  He sustained a cardiac arrest there in the emergency department after an seizure episode.  Due to Covid positivity he was sent to [facility] and was admitted under PCCM service.  He was defibrillated after cardiac arrest.  Intubated for airway protection.  Currently extubated and maintaining his saturation on nasal cannula.  Patient transferred to [facility] service today.  Hospital course remarkable for development of AKI on CKD.  Assessment & Plan:   Active Problems:   Acute respiratory failure with hypoxemia (HCC)   Cardiac arrest (HCC)   Status post cardiac arrest: Initial rhythm was reported to be VT with conversion to A. fib RVR so he was defibrillated.  Downtime stimulated at 11 minutes.  Cardiology following.  Currently hemodynamically stable. Echocardiogram done on 05/08/2019 showed ejection fraction of 40- 45% showing improvement on comparison to his last echo.  A. fib with RVR: Currently in normal sinus rhythm, started on amiodarone.  Cardiology following.  On Eliquis at home for anticoagulation which cannot be given because of acute kidney injury.  Currently on heparin drip  AKI on CKD stage 4: Creatinine creeping up.  He has poor urine output. Baseline creatinine around 2.5-3. AKI most likely secondary to ATN due to  hypotension secondary to cardiac arrest.  Also has metabolic acidosis.  Started on bicarb and sterile water today.  Will check BMP tomorrow.  Acute hypoxic respiratory failure: Intubated for airway  protection.  Currently on oxygen at 2 to 3 L/min.  Not on oxygen at home.  Chest x-ray on 05/07/2019 showed stable bibasilar opacities.  COVID-19 infection: No active infection at this point.  No COVID specific treatment.  Continue supportive care.  On airborne precaution.  Hypotension: Required Levophed drip for maintenance of blood pressure.  Currently off drip.  Maintaining blood pressure.  Elevated liver enzymes: Most likely secondary to shock liver.  Anticipate improvement.  Seizure: Presented initially with seizure.  Had an seizure episode in the emergency department.  Started on Keppra.  Neurology was following.  Needs follow-up with neurology on discharge.  Debility/deconditioning: I will request for PT evaluation.  Patient moved to 2 W    Nutrition Problem: Increased nutrient needs Etiology: acute illness      DVT prophylaxis: Heparin IV Code Status: Full Family Communication: None present at the bed side Disposition Plan: Not stable for discharge.  Most likely discharge planning to home when stable   Consultants: PCCM,cardiology  Procedures: Intubation/extubation  Antimicrobials:  Anti-infectives (From admission, onward)   Start     Dose/Rate Route Frequency Ordered Stop   05/08/19 1000  piperacillin-tazobactam (ZOSYN) IVPB 3.375 g     3.375 g 12.5 mL/hr over 240 Minutes Intravenous Every 12 hours 05/08/19 0930     05/08/19 0630  piperacillin-tazobactam (ZOSYN) IVPB 3.375 g  Status:  Discontinued     3.375 g 12.5 mL/hr over 240 Minutes Intravenous Every 12 hours 05/07/19 2040 05/08/19 0930   05/07/19 2200  vancomycin (VANCOCIN) 1,750 mg in sodium chloride 0.9 % 500 mL IVPB  Status:  Discontinued     1,750 mg 250  mL/hr over 120 Minutes Intravenous Every 48 hours 05/06/19 1912 05/07/19 1039   05/07/19 2200  vancomycin (VANCOCIN) 1,500 mg in sodium chloride 0.9 % 500 mL IVPB  Status:  Discontinued     1,500 mg 250 mL/hr over 120 Minutes Intravenous Every 48 hours  05/07/19 1039 05/07/19 1228   05/07/19 0200  piperacillin-tazobactam (ZOSYN) IVPB 3.375 g  Status:  Discontinued     3.375 g 12.5 mL/hr over 240 Minutes Intravenous Every 8 hours 05/06/19 1853 05/07/19 2040   05/06/19 1930  piperacillin-tazobactam (ZOSYN) IVPB 3.375 g     3.375 g 100 mL/hr over 30 Minutes Intravenous  Once 05/06/19 1853 05/06/19 2156      Subjective:  Patient seen and examined the bedside this morning.  Hemodynamically stable.  Looked comfortable.  Not in any kind of respiratory distress.  Denies any complaints.  Very pleasant.  Eager to go home.  Objective: Vitals:   05/08/19 0800 05/08/19 0900 05/08/19 1000 05/08/19 1148  BP: 132/68 (!) 113/52 (!) 148/71 (!) 149/78  Pulse: 78 76 85 87  Resp: (!) 26 (!) 26 (!) 33 (!) 26  Temp: 97.9 F (36.6 C)   97.9 F (36.6 C)  TempSrc: Oral   Oral  SpO2: 92% (!) 89% (!) 86% 92%  Weight:      Height:        Intake/Output Summary (Last 24 hours) at 05/08/2019 1407 Last data filed at 05/08/2019 1100 Gross per 24 hour  Intake 2560.03 ml  Output 460 ml  Net 2100.03 ml   Filed Weights   05/06/19 1640 05/07/19 0343 05/08/19 0347  Weight: 92.1 kg 91 kg 93.7 kg    Examination:  General exam: Appears calm and comfortable ,Not in distress,average built, appears older than age. HEENT:PERRL,Oral mucosa moist, Ear/Nose normal on gross exam Respiratory system: Bilateral equal air entry, normal vesicular breath sounds, no wheezes or crackles heard with yellow steth Cardiovascular system: S1 & S2 heard, RRR. No JVD, murmurs, rubs, gallops or clicks. No pedal edema. Gastrointestinal system: Abdomen is nondistended, soft and nontender. No organomegaly or masses felt. Normal bowel sounds heard. Central nervous system: Alert and oriented. No focal neurological deficits. Extremities: No edema, no clubbing ,no cyanosis, distal peripheral pulses palpable. Skin: No rashes, lesions or ulcers,no icterus ,no pallor      Data Reviewed: I  have personally reviewed following labs and imaging studies  CBC: Recent Labs  Lab 05/05/19 1832 05/06/19 1205  05/06/19 2052 05/07/19 0342 05/07/19 0400 05/07/19 1801 05/08/19 0344  WBC 8.2 4.7  --  11.1* 9.5  --   --  8.8  NEUTROABS  --  1.5*  --   --   --   --   --   --   HGB 12.5* 11.5*   < > 12.0* 12.3* 15.0 9.9* 10.0*  HCT 41.1 40.4   < > 37.4* 40.3 44.0 29.0* 33.3*  MCV 93.8 100.2*  --  93.0 96.6  --   --  96.8  PLT 353 244  --  342 357  --   --  230   < > = values in this interval not displayed.   Basic Metabolic Panel: Recent Labs  Lab 05/06/19 1205  05/06/19 2052 05/07/19 0342 05/07/19 0400 05/07/19 1439 05/07/19 1801 05/08/19 0004 05/08/19 0344  NA 141   < > 139 140 135 141 141 138  --   K 4.1   < > 3.7 4.8 4.5 4.7 4.3 4.0  --   CL  112*  --  110 109  --  112*  --  110  --   CO2 10*  --  14* 13*  --  15*  --  13*  --   GLUCOSE 246*  --  146* 172*  --  141*  --  100*  --   BUN 39*  --  42* 42*  --  44*  --  46*  --   CREATININE 2.83*  --  3.63* 3.81*  --  4.62*  --  4.99*  --   CALCIUM 6.3*  --  6.2* 6.8*  --  6.5*  --  6.6*  --   MG 1.9  --  1.1* 2.2  --  2.2  --   --  1.7  PHOS  --   --  5.6* 5.8*  --  6.7*  --   --  5.2*   < > = values in this interval not displayed.   GFR: Estimated Creatinine Clearance: 17.3 mL/min (A) (by C-G formula based on SCr of 4.99 mg/dL (H)). Liver Function Tests: Recent Labs  Lab 05/05/19 1832 05/06/19 1205 05/06/19 2052 05/08/19 0344  AST 44* 628* 1,197* 164*  ALT 22 329* 376* 187*  ALKPHOS 62 61 91 61  BILITOT 0.7 0.6 0.9 1.0  PROT 6.8 5.3* 5.5* 5.2*  ALBUMIN 3.1* 2.4* 2.4* 2.2*   No results for input(s): LIPASE, AMYLASE in the last 168 hours. No results for input(s): AMMONIA in the last 168 hours. Coagulation Profile: Recent Labs  Lab 05/05/19 2114 05/06/19 2052 05/08/19 0344  INR 1.4* 1.5* 1.5*   Cardiac Enzymes: No results for input(s): CKTOTAL, CKMB, CKMBINDEX, TROPONINI in the last 168 hours. BNP  (last 3 results) No results for input(s): PROBNP in the last 8760 hours. HbA1C: No results for input(s): HGBA1C in the last 72 hours. CBG: Recent Labs  Lab 05/07/19 1227 05/07/19 1658 05/08/19 0004 05/08/19 0346 05/08/19 1213  GLUCAP 141* 116* 92 97 180*   Lipid Profile: Recent Labs    05/07/19 0342  TRIG 111   Thyroid Function Tests: No results for input(s): TSH, T4TOTAL, FREET4, T3FREE, THYROIDAB in the last 72 hours. Anemia Panel: Recent Labs    05/05/19 2114  FERRITIN 481*   Sepsis Labs: Recent Labs  Lab 05/05/19 2114 05/06/19 2052 05/07/19 0020  PROCALCITON 0.14 14.10  --   LATICACIDVEN 1.2 1.1 0.9    Recent Results (from the past 240 hour(s))  SARS Coronavirus 2 [facility] order, Performed in [facility] hospital lab) Nasopharyngeal Nasopharyngeal Swab     Status: Abnormal   Collection Time: 05/04/19 11:25 AM   Specimen: Nasopharyngeal Swab  Result Value Ref Range Status   SARS Coronavirus 2 POSITIVE (A) NEGATIVE Final    Comment: RESULT CALLED TO, READ BACK BY AND VERIFIED WITH: ASHLEY MAY AT 1248 ON 05/04/2019 [place]. (NOTE) If result is NEGATIVE SARS-CoV-2 target nucleic acids are NOT DETECTED. The SARS-CoV-2 RNA is generally detectable in upper and lower  respiratory specimens during the acute phase of infection. The lowest  concentration of SARS-CoV-2 viral copies this assay can detect is 250  copies / mL. A negative result does not preclude SARS-CoV-2 infection  and should not be used as the sole basis for treatment or other  patient management decisions.  A negative result may occur with  improper specimen collection / handling, submission of specimen other  than nasopharyngeal swab, presence of viral mutation(s) within the  areas targeted by this assay, and inadequate number of viral  copies  (<250 copies / mL). A negative result must be combined with clinical  observations, patient history, and epidemiological information. If result is  POSITIVE SARS-CoV-2 target nucleic acids are DETECTED.  The SARS-CoV-2 RNA is generally detectable in upper and lower  respiratory specimens during the acute phase of infection.  Positive  results are indicative of active infection with SARS-CoV-2.  Clinical  correlation with patient history and other diagnostic information is  necessary to determine patient infection status.  Positive results do  not rule out bacterial infection or co-infection with other viruses. If result is PRESUMPTIVE POSTIVE SARS-CoV-2 nucleic acids MAY BE PRESENT.   A presumptive positive result was obtained on the submitted specimen  and confirmed on repeat testing.  While 2019 novel coronavirus  (SARS-CoV-2) nucleic acids may be present in the submitted sample  additional confirmatory testing may be necessary for epidemiological  and / or clinical management purposes  to differentiate between  SARS-CoV-2 and other Sarbecovirus currently known to infect humans.  If clinically indicated additional testing with an alternate test  methodology [phone]) i s advised. The SARS-CoV-2 RNA is generally  detectable in upper and lower respiratory specimens during the acute  phase of infection. The expected result is Negative. Fact Sheet for Patients:  StrictlyIdeas.no Fact Sheet for Healthcare Providers: BankingDealers.co.za This test is not yet approved or cleared by the [place] FDA and has been authorized for detection and/or diagnosis of SARS-CoV-2 by FDA under an Emergency Use Authorization (EUA).  This EUA will remain in effect (meaning this test can be used) for the duration of the COVID-19 declaration under Section 564(b)(1) of the Act, 21 U.S.C. section 360bbb-3(b)(1), unless the authorization is terminated or revoked sooner. Performed at [facility], [address] [place], [place] [address]   Blood Culture (routine x 2)     Status: None  (Preliminary result)   Collection Time: 05/05/19  9:14 PM   Specimen: BLOOD  Result Value Ref Range Status   Specimen Description BLOOD LEFT ANTECUBITAL  Final   Special Requests   Final    BOTTLES DRAWN AEROBIC AND ANAEROBIC Blood Culture results may not be optimal due to an excessive volume of blood received in culture bottles   Culture   Final    NO GROWTH 3 DAYS Performed at [facility], [address] [place], [place] [address]    Report Status PENDING  Incomplete  Blood Culture (routine x 2)     Status: None (Preliminary result)   Collection Time: 05/05/19  9:40 PM   Specimen: BLOOD  Result Value Ref Range Status   Specimen Description BLOOD BLOOD RIGHT WRIST  Final   Special Requests   Final    BOTTLES DRAWN AEROBIC AND ANAEROBIC Blood Culture adequate volume   Culture   Final    NO GROWTH 3 DAYS Performed at [facility], [address], [place], [place] [address]    Report Status PENDING  Incomplete  MRSA PCR Screening     Status: None   Collection Time: 05/06/19  5:31 PM   Specimen: Nasal Mucosa; Nasopharyngeal  Result Value Ref Range Status   MRSA by PCR NEGATIVE NEGATIVE Final    Comment:        The GeneXpert MRSA Assay (FDA approved for NASAL specimens only), is one component of a comprehensive MRSA colonization surveillance program. It is not intended to diagnose MRSA infection nor to guide or monitor treatment for MRSA infections. Performed at [facility], [address] [place], [place] [address]   Culture, blood (routine x 2)     Status: None (Preliminary result)   Collection Time: 05/06/19  8:10 PM   Specimen: BLOOD RIGHT HAND  Result Value Ref Range Status   Specimen Description BLOOD RIGHT HAND  Final   Special Requests   Final    BOTTLES DRAWN AEROBIC AND ANAEROBIC Blood Culture results may not be optimal due to an inadequate volume of blood received in culture bottles   Culture   Final    NO GROWTH 2 DAYS Performed  at [facility], [place] [address], [place], [place] [address]    Report Status PENDING  Incomplete  Culture, blood (routine x 2)     Status: None (Preliminary result)   Collection Time: 05/06/19  8:33 PM   Specimen: BLOOD LEFT HAND  Result Value Ref Range Status   Specimen Description BLOOD LEFT HAND  Final   Special Requests   Final    AEROBIC BOTTLE ONLY Blood Culture results may not be optimal due to an inadequate volume of blood received in culture bottles   Culture   Final    NO GROWTH 2 DAYS Performed at [facility], [place] [address], [place], [place] [address]    Report Status PENDING  Incomplete  Urine culture     Status: None   Collection Time: 05/06/19  9:17 PM   Specimen: Urine, Random  Result Value Ref Range Status   Specimen Description URINE, RANDOM  Final   Special Requests NONE  Final   Culture   Final    NO GROWTH Performed at [facility], [place] [address], [place], [place] [address]    Report Status 05/07/2019 FINAL  Final  Culture, respiratory (tracheal aspirate)     Status: None (Preliminary result)   Collection Time: 05/07/19 10:24 AM   Specimen: Tracheal Aspirate; Respiratory  Result Value Ref Range Status   Specimen Description TRACHEAL ASPIRATE  Final   Special Requests NONE  Final   Gram Stain NO WBC SEEN RARE BUDDING YEAST SEEN   Final   Culture   Final    CULTURE REINCUBATED FOR BETTER GROWTH Performed at [facility], [place] [address] [place], [place] [address]    Report Status PENDING  Incomplete         Radiology Studies: Dg Chest Port 1 View  Result Date: 05/07/2019 CLINICAL DATA:  Endotracheal tube and central line placement. EXAM: PORTABLE CHEST 1 VIEW COMPARISON:  Radiograph of May 06, 2019. FINDINGS: Stable cardiomediastinal silhouette. Endotracheal and nasogastric tubes are unchanged in position. Right internal jugular catheter is unchanged with distal tip in expected position of the SVC. No pneumothorax is  noted. Stable bibasilar opacities are noted. No pleural effusion is noted. Bony thorax unremarkable. IMPRESSION: Grossly stable support apparatus. Stable bibasilar opacities as described above. Electronically Signed   By: Marijo Conception M.D.   On: 05/07/2019 07:49   Dg Chest Port 1 View  Result Date: 05/06/2019 CLINICAL DATA:  Check endotracheal tube placement EXAM: PORTABLE CHEST 1 VIEW COMPARISON:  05/06/2019 FINDINGS: Endotracheal tube, gastric catheter and right jugular central line are noted in satisfactory position. No pneumothorax is noted. Patchy bilateral infiltrates are again seen but slightly improved when compared with the prior study. No sizable effusion is noted. No bony abnormality is seen. IMPRESSION: No pneumothorax following central line placement. Tubes and lines as described above. Bilateral patchy infiltrates slightly improved from  the prior study. Electronically Signed   By: Inez Catalina M.D.   On: 05/06/2019 19:46   Vas [place] Lower Extremity Venous (dvt)  Result Date: 05/07/2019  Lower Venous Study Indications: COVID positive.  Limitations: Posterior acoustic shadowing from arterial calcification. Comparison Study: No prior study. Performing Technologist: Maudry Mayhew MHA, RDMS, RVT, RDCS  Examination Guidelines: A complete evaluation includes B-mode imaging, spectral Doppler, color Doppler, and power Doppler as needed of all accessible portions of each vessel. Bilateral testing is considered an integral part of a complete examination. Limited examinations for reoccurring indications may be performed as noted.  +---------+---------------+---------+-----------+----------+--------------+  RIGHT     Compressibility Phasicity Spontaneity Properties Thrombus Aging  +---------+---------------+---------+-----------+----------+--------------+  CFV       Full                                                              +---------+---------------+---------+-----------+----------+--------------+  FV Prox   Full                                                             +---------+---------------+---------+-----------+----------+--------------+  FV Distal Full                                                             +---------+---------------+---------+-----------+----------+--------------+  POP       Full                                                             +---------+---------------+---------+-----------+----------+--------------+  PTV       Full                                                             +---------+---------------+---------+-----------+----------+--------------+  PERO      Full                                                             +---------+---------------+---------+-----------+----------+--------------+  +---------+---------------+---------+-----------+----------+--------------+  LEFT      Compressibility Phasicity Spontaneity Properties Thrombus Aging  +---------+---------------+---------+-----------+----------+--------------+  CFV       Full            Yes       Yes                                    +---------+---------------+---------+-----------+----------+--------------+  SFJ       Full                                                             +---------+---------------+---------+-----------+----------+--------------+  FV Prox   Full                                                             +---------+---------------+---------+-----------+----------+--------------+  FV Distal Full                                                             +---------+---------------+---------+-----------+----------+--------------+  POP       Full            Yes       Yes                                    +---------+---------------+---------+-----------+----------+--------------+  PTV       Full                                                              +---------+---------------+---------+-----------+----------+--------------+  PERO      Full                                                             +---------+---------------+---------+-----------+----------+--------------+  Summary: Right: There is no evidence of deep vein thrombosis in the lower extremity. However, portions of this examination were limited- see technologist comments above. No cystic structure found in the popliteal fossa. Left: There is no evidence of deep vein thrombosis in the lower extremity. However, portions of this examination were limited- see technologist comments above. No cystic structure found in the popliteal fossa.  *See table(s) above for measurements and observations. Electronically signed by Monica Martinez MD on 05/07/2019 at 5:07:11 PM.    Final         Scheduled Meds:  budesonide (PULMICORT) nebulizer solution  0.25 mg Nebulization BID   calcitRIOL  0.25 mcg Oral Daily   Chlorhexidine Gluconate Cloth  6 each Topical Daily   feeding supplement (GLUCERNA SHAKE)  237 mL Oral BID BM   hydrocortisone sodium succinate  50 mg Intravenous Q12H   Ipratropium-Albuterol  1 puff Inhalation Q6H   levETIRAcetam  500 mg Oral BID   [START ON 05/09/2019] pantoprazole  40 mg Oral Daily   sodium chloride flush  3 mL Intravenous Q12H   Continuous Infusions:  sodium chloride  heparin 1,600 Units/hr (05/08/19 1100)   piperacillin-tazobactam (ZOSYN)  IV 12.5 mL/hr at 05/08/19 1100    sodium bicarbonate (isotonic) infusion in sterile water 100 mL/hr at 05/08/19 1208     LOS: 2 days    Time spent: 35 mins.More than 50% of that time was spent in counseling and/or coordination of care.      Shelly Coss, MD [facility] Pager [phone]  If 7PM-7AM, please contact night-coverage [email or web address] Password [email or web address] 05/08/2019, 2:07 PM

## 2019-05-08 NOTE — Progress Notes (Signed)
The patient in 2W32 Dylan Goodman is sustained HR in the 130s on 15mg  of Cardizem per hour. Paged Dr. Debara Pickett with cardiology for further orders.  Awaiting response.

## 2019-05-08 NOTE — Progress Notes (Addendum)
Nutrition Follow-up  DOCUMENTATION CODES:   Not applicable  INTERVENTION:    Add Glucerna Shake po BID, each supplement provides 220 kcal and 10 grams of protein  NUTRITION DIAGNOSIS:   Increased nutrient needs related to acute illness as evidenced by estimated needs.  Ongoing  GOAL:   Patient will meet greater than or equal to 90% of their needs  Progressing   MONITOR:   PO intake, Labs  ASSESSMENT:   68 yo male admitted with seizure and brief cardiac arrest in the [facility] ED. Required intubation and was transferred to [facility]. PMH includes recent COVID-19 PNA (at [facility] 9/14[facility]9/[facility]), nasopharyngeal cancer (05/2018), HF, CAD, chronic diarrhea r/t Menetrier's disease, DM-2, A fib, CKD-IV.  Patient was extubated yesterday afternoon. TF was never started.  Diet has been advanced to heart healthy, CHO modified. Patient consumed 90% of supper yesterday and almost all of breakfast this morning.   Labs reviewed. Phosphorus 5.2 (H), BUN 46 (H), creatinine 4.99 (H) CBG's: 92-97  Medications reviewed and include calcitriol, solucortef.  I/O +5.7 L since admission  Diet Order:   Diet Order            Diet heart healthy/carb modified Room service appropriate? Yes with Assist; Fluid consistency: Thin  Diet effective now              EDUCATION NEEDS:   Not appropriate for education at this time  Skin:  Skin Assessment: Reviewed RN Assessment  Last BM:  10/9 type 6  Height:   Ht Readings from Last 1 Encounters:  05/06/19 5\' 11"  (1.803 m)    Weight:   Wt Readings from Last 1 Encounters:  05/08/19 93.7 kg    Ideal Body Weight:  78.2 kg  BMI:  Body mass index is 28.81 kg/m.  Estimated Nutritional Needs:   Kcal:  2100-2300  Protein:  110-130 gm  Fluid:  >/= 2.1 L    Molli Barrows, RD, LDN, [place] Pager [phone] After Hours Pager [phone]

## 2019-05-08 NOTE — Progress Notes (Signed)
Started patient on Cardizem drip at 5mg /hr for sustained a-fib HR of 130-160s per provider.  Patient is asymptomatic for SOB and CP.  Provider and charge nurse are aware of MEWS score changes.

## 2019-05-08 NOTE — Progress Notes (Signed)
Received a consult for a bleeding insertion site post CVC removal. Advised RN to continue with pressure dressing to site, and can also get an order for gelfoam pad to apply to the area.

## 2019-05-08 NOTE — TOC Initial Note (Addendum)
Transition of Care [facility]) - Initial/Assessment Note    Patient Details  Name: Dylan Goodman MRN: [id number] Date of Birth: [birth year]  Transition of Care [facility]) CM/SW Contact:    Maryclare Labrador, RN Phone Number: 05/08/2019, 4:36 PM  Clinical Narrative:     Pt very recently disharged home from [facility] pt.  Pt lives with his wife.  CM was unable to reach pt via phone (CM working remote), CM was able to reach wife via phone.  Wife spoke with pt and he is interested in [facility] as recommended, CM reviewed home health list per medicare.gov list and wife chose [facility] - agency accepted - CM requested [place] orders  Expected Discharge Plan: [place] Barriers to Discharge: (coVID)   Patient Goals and CMS Choice     Choice offered to / list presented to : Spouse  Expected Discharge Plan and Services Expected Discharge Plan: [place] arrangements for the past 2 months: Single Family Home                 DME Arranged: [place] rolling DME Agency: [employer] Date DME Agency Contacted: 05/08/19 Time DME Agency Contacted: [phone] Representative spoke with at DME Agency: Brad            Prior Living Arrangements/Services Living arrangements for the past 2 months: [place] with:: Spouse                   Activities of Daily Living Home Assistive Devices/Equipment: Hearing aid ADL Screening (condition at time of admission) Patient's cognitive ability adequate to safely complete daily activities?: No Is the patient deaf or have difficulty hearing?: No Does the patient have difficulty seeing, even when wearing glasses/contacts?: No Does the patient have difficulty concentrating, remembering, or making decisions?: No Patient able to express need for assistance with ADLs?: Yes Does the patient have difficulty dressing or bathing?: No Independently performs ADLs?: Yes (appropriate for developmental age) Does the  patient have difficulty walking or climbing stairs?: Yes Weakness of Legs: Both Weakness of Arms/Hands: None  Permission Sought/Granted                  Emotional Assessment              Admission diagnosis:  COVID-19 [U07.1] Patient Active Problem List   Diagnosis Date Noted  . Prolonged QT interval 05/08/2019  . Acute respiratory failure with hypoxemia ([place]) 05/06/2019  . Cardiac arrest ([place])   . Acute renal failure superimposed on stage 4 chronic kidney disease ([place]) 04/13/2019  . Hypocalcemia 04/13/2019  . Hyponatremia 04/13/2019  . Metabolic acidosis 11/91/4782  . Unspecified atrial fibrillation ([place]) 04/13/2019  . COVID-19 virus infection 04/13/2019  . Acute respiratory failure with hypoxia ([place]) 04/13/2019  . Dizziness 03/12/2019  . Atherosclerosis of aorta ([place]) 01/29/2019  . Type 2 diabetes mellitus with stage 4 chronic kidney disease, with long-term current use of insulin ([place]) 09/13/2018  . Hyperlipidemia, mixed 09/13/2018  . Senile purpura ([place]) 09/08/2018  . Morbid obesity ([place]) 09/08/2018  . Chronic diarrhea of unknown origin 09/04/2018  . Menetrier's disease (hyperplastic hypersecretory gastropathy) 09/04/2018  . Chronic kidney disease 09/03/2018  . History of gastric ulcer 09/03/2018  . Gastritis 09/03/2018  . GERD (gastroesophageal reflux disease) 09/03/2018  . IDA (iron deficiency anemia) 09/03/2018  . Hypomagnesemia 09/03/2018  . History of cancer chemotherapy 09/03/2018  . Chronic systolic CHF (congestive  heart failure), NYHA class 2 ([place]) 09/02/2018  . Atherosclerosis of native arteries of extremity with intermittent claudication ([place]) 07/21/2018  . Benign essential HTN 07/21/2018  . Bilateral carotid artery stenosis 07/21/2018  . 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 [place] [address] Phone: [phone] Fax: [phone]     Social Determinants of Health (SDOH) Interventions    Readmission Risk Interventions No flowsheet data found.

## 2019-05-08 NOTE — Progress Notes (Signed)
Patient continues on 15mg /hr IV Cardizem.  Lopressor 5mg  IV given and HR came down to 110s and is now back in 120-130s.  Oral dose of Lopressor also given. BP remains stable.  Updated Dr. Debara Pickett with cardiology with patient condition via pager.  Awaiting response.

## 2019-05-08 NOTE — Progress Notes (Signed)
Patient requested and insisted not to have blood sugar checked with hospital lancet this morning. He has a medical device attached to his arm that he uses to check his blood sugar. 8am reading on his device was 141. There is no sliding scale insulin ordered. Will continue to monitor.

## 2019-05-08 NOTE — Progress Notes (Signed)
Received no new orders at this time.

## 2019-05-08 NOTE — Progress Notes (Signed)
SLP Cancellation Note  Patient Details Name: Dylan Goodman MRN: [id number] DOB: Dec 31, 1950   Cancelled treatment:         Pt has been tolerating a diet after brief intubation. Has eaten a couple of meals. No difficulty per RN. Will defer swallow eval at this time.   Herbie Baltimore, MA [place]  [facility] Services Pager [phone] Office [phone]  Lynann Beaver 05/08/2019, 9:44 AM

## 2019-05-08 NOTE — Progress Notes (Signed)
DAILY PROGRESS NOTE   Patient Name: Dylan Goodman Date of Encounter: 05/08/2019 Cardiologist: Corey Skains, MD  Chief Complaint   No complaints reported by IM  Patient Profile   Dylan Goodman is a 68 y.o. male with a history of CAD status post CABG, cardiomyopathy with LVEF 35 to 40% by echocardiogram in August, PAD status post previous revascularization surgery, atrial fibrillation, type II diabetes mellitus, CKD stage IV, and admission for COVID-19 pneumonia from September 14 through September 19 at [facility] who is being seen today for the evaluation of recent cardiac arrest at the request of Dr. Nelda Marseille.  Subjective   Extubated and transitioned to the floor. Echo performed today shows LVEF 45-50% (higher than last echo) without regional wall motion abnormalities, however, endocardial definition is poor. On IV heparin and amiodarone for afib (had been in this rhythm after cardioversion for VT) - now in sinus - HR in the 80's. Creatinine continues to rise to 4.99 this morning- this will not support coronary evaluation.  Objective   Vitals:   05/08/19 1148 05/08/19 1405 05/08/19 1513 05/08/19 1526  BP: (!) 149/78 131/68 (!) 132/92 115/69  Pulse: 87 84 (!) 123 (!) 122  Resp: (!) 26 (!) 29 (!) 24 20  Temp: 97.9 F (36.6 C)     TempSrc: Oral     SpO2: 92% 90% (!) 89% 90%  Weight:      Height:        Intake/Output Summary (Last 24 hours) at 05/08/2019 1534 Last data filed at 05/08/2019 1431 Gross per 24 hour  Intake 2439.98 ml  Output 710 ml  Net 1729.98 ml   Filed Weights   05/06/19 1640 05/07/19 0343 05/08/19 0347  Weight: 92.1 kg 91 kg 93.7 kg    Physical Exam   Due to the current COVID-19 pandemic and the desire to minimize use of PPE and reduce provider exposure, the patient exam was deferred or conducted via video or teleconference.  Inpatient Medications    Scheduled Meds:  budesonide (PULMICORT) nebulizer solution  0.25 mg Nebulization BID    calcitRIOL  0.25 mcg Oral Daily   Chlorhexidine Gluconate Cloth  6 each Topical Daily   diltiazem  10 mg Intravenous Once   feeding supplement (GLUCERNA SHAKE)  237 mL Oral BID BM   hydrocortisone sodium succinate  50 mg Intravenous Q12H   Ipratropium-Albuterol  1 puff Inhalation Q6H   levETIRAcetam  500 mg Oral BID   [START ON 05/09/2019] pantoprazole  40 mg Oral Daily   sodium chloride flush  3 mL Intravenous Q12H    Continuous Infusions:  sodium chloride     diltiazem (CARDIZEM) infusion     heparin 1,600 Units/hr (05/08/19 1100)   magnesium sulfate bolus IVPB 2 g (05/08/19 1523)   piperacillin-tazobactam (ZOSYN)  IV 12.5 mL/hr at 05/08/19 1100    sodium bicarbonate (isotonic) infusion in sterile water 100 mL/hr at 05/08/19 1208    PRN Meds: sodium chloride, albuterol, sodium chloride flush   Labs   Results for orders placed or performed during the hospital encounter of 05/06/19 (from the past 48 hour(s))  Glucose, capillary     Status: Abnormal   Collection Time: 05/06/19  4:19 PM  Result Value Ref Range   Glucose-Capillary 201 (H) 70 - 99 mg/dL  I-STAT 7, (LYTES, BLD GAS, ICA, H+H)     Status: Abnormal   Collection Time: 05/06/19  5:25 PM  Result Value Ref Range   pH, Arterial 7.254 (  L) 7.350 - 7.450   pCO2 arterial 34.6 32.0 - 48.0 mmHg   pO2, Arterial 139.0 (H) 83.0 - 108.0 mmHg   Bicarbonate 15.4 (L) 20.0 - 28.0 mmol/L   TCO2 16 (L) 22 - 32 mmol/L   O2 Saturation 99.0 %   Acid-base deficit 11.0 (H) 0.0 - 2.0 mmol/L   Sodium 140 135 - 145 mmol/L   Potassium 3.8 3.5 - 5.1 mmol/L   Calcium, Ion 0.96 (L) 1.15 - 1.40 mmol/L   HCT 36.0 (L) 39.0 - 52.0 %   Hemoglobin 12.2 (L) 13.0 - 17.0 g/dL   Patient temperature 98.1 F    Sample type ARTERIAL   MRSA PCR Screening     Status: None   Collection Time: 05/06/19  5:31 PM   Specimen: Nasal Mucosa; Nasopharyngeal  Result Value Ref Range   MRSA by PCR NEGATIVE NEGATIVE    Comment:        The GeneXpert  MRSA Assay (FDA approved for NASAL specimens only), is one component of a comprehensive MRSA colonization surveillance program. It is not intended to diagnose MRSA infection nor to guide or monitor treatment for MRSA infections. Performed at [facility], [place] [address] [place], [place] [address]   Culture, blood (routine x 2)     Status: None (Preliminary result)   Collection Time: 05/06/19  8:10 PM   Specimen: BLOOD RIGHT HAND  Result Value Ref Range   Specimen Description BLOOD RIGHT HAND    Special Requests      BOTTLES DRAWN AEROBIC AND ANAEROBIC Blood Culture results may not be optimal due to an inadequate volume of blood received in culture bottles   Culture      NO GROWTH 2 DAYS Performed at [facility], [place] [address], [place], [place] [address]    Report Status PENDING   Culture, blood (routine x 2)     Status: None (Preliminary result)   Collection Time: 05/06/19  8:33 PM   Specimen: BLOOD LEFT HAND  Result Value Ref Range   Specimen Description BLOOD LEFT HAND    Special Requests      AEROBIC BOTTLE ONLY Blood Culture results may not be optimal due to an inadequate volume of blood received in culture bottles   Culture      NO GROWTH 2 DAYS Performed at [facility], [place] [address], [place], [place] [address]    Report Status PENDING   CBC     Status: Abnormal   Collection Time: 05/06/19  8:52 PM  Result Value Ref Range   WBC 11.1 (H) 4.0 - 10.5 K/uL   RBC 4.02 (L) 4.22 - 5.81 MIL/uL   Hemoglobin 12.0 (L) 13.0 - 17.0 g/dL   HCT 37.4 (L) 39.0 - 52.0 %   MCV 93.0 80.0 - 100.0 fL   MCH 29.9 26.0 - 34.0 pg   MCHC 32.1 30.0 - 36.0 g/dL   RDW 15.2 11.5 - 15.5 %   Platelets 342 150 - 400 K/uL   nRBC 0.4 (H) 0.0 - 0.2 %    Comment: Performed at [facility] [address] [place],  [address]  Comprehensive metabolic panel     Status: Abnormal   Collection Time: 05/06/19  8:52 PM  Result Value Ref Range   Sodium 139 135 - 145  mmol/L   Potassium 3.7 3.5 - 5.1 mmol/L   Chloride 110 98 - 111 mmol/L   CO2 14 (L) 22 - 32  mmol/L   Glucose, Bld 146 (H) 70 - 99 mg/dL   BUN 42 (H) 8 - 23 mg/dL   Creatinine, Ser 3.63 (H) 0.61 - 1.24 mg/dL   Calcium 6.2 (LL) 8.9 - 10.3 mg/dL    Comment: CRITICAL RESULT CALLED TO, READ BACK BY AND VERIFIED WITH: [facility] C,RN 05/06/19 2137 WAYK    Total Protein 5.5 (L) 6.5 - 8.1 g/dL   Albumin 2.4 (L) 3.5 - 5.0 g/dL   AST 1,197 (H) 15 - 41 U/L   ALT 376 (H) 0 - 44 U/L   Alkaline Phosphatase 91 38 - 126 U/L   Total Bilirubin 0.9 0.3 - 1.2 mg/dL   GFR calc non Af Amer 17 (L) >60 mL/min   GFR calc Af Amer 19 (L) >60 mL/min   Anion gap 15 5 - 15    Comment: Performed at [facility] [address] [place], [place] [address]  Magnesium     Status: Abnormal   Collection Time: 05/06/19  8:52 PM  Result Value Ref Range   Magnesium 1.1 (L) 1.7 - 2.4 mg/dL    Comment: Performed at [facility] [address], [place], [place] [address]  Phosphorus     Status: Abnormal   Collection Time: 05/06/19  8:52 PM  Result Value Ref Range   Phosphorus 5.6 (H) 2.5 - 4.6 mg/dL    Comment: Performed at [facility] [address] [place], [place] [address]  Lactic acid, plasma     Status: None   Collection Time: 05/06/19  8:52 PM  Result Value Ref Range   Lactic Acid, Venous 1.1 0.5 - 1.9 mmol/L    Comment: Performed at [facility] [address] [place], [place] [address]  Procalcitonin     Status: None   Collection Time: 05/06/19  8:52 PM  Result Value Ref Range   Procalcitonin 14.10 ng/mL    Comment:        Interpretation: PCT >= 10 ng/mL: Important systemic inflammatory response, almost exclusively due to severe bacterial sepsis or septic shock. (NOTE)       Sepsis PCT Algorithm           Lower Respiratory Tract                                      Infection PCT Algorithm    ----------------------------     ----------------------------         PCT < 0.25  ng/mL                PCT < 0.10 ng/mL         Strongly encourage             Strongly discourage   discontinuation of antibiotics    initiation of antibiotics    ----------------------------     -----------------------------       PCT 0.25 - 0.50 ng/mL            PCT 0.10 - 0.25 ng/mL               OR       >80% decrease in PCT            Discourage initiation of  antibiotics      Encourage discontinuation           of antibiotics    ----------------------------     -----------------------------         PCT >= 0.50 ng/mL              PCT 0.26 - 0.50 ng/mL                AND       <80% decrease in PCT             Encourage initiation of                                             antibiotics       Encourage continuation           of antibiotics    ----------------------------     -----------------------------        PCT >= 0.50 ng/mL                  PCT > 0.50 ng/mL               AND         increase in PCT                  Strongly encourage                                      initiation of antibiotics    Strongly encourage escalation           of antibiotics                                     -----------------------------                                           PCT <= 0.25 ng/mL                                                 OR                                        > 80% decrease in PCT                                     Discontinue / Do not initiate                                             antibiotics Performed at [facility], [place] [address] [place], [place] [address]   Brain natriuretic peptide     Status: Abnormal   Collection Time: 05/06/19  8:52 PM  Result Value Ref Range   B Natriuretic Peptide 379.2 (H) 0.0 - 100.0 pg/mL    Comment: Performed at [facility] [address] [place], [place] [address]  Cortisol     Status: None   Collection Time: 05/06/19  8:52 PM  Result Value Ref Range   Cortisol,  Plasma 6.3 ug/dL    Comment: (NOTE) AM    6.7 - 22.6 ug/dL PM   <10.0       ug/dL Performed at [facility] [address], [place], [place] [address]   Protime-INR     Status: Abnormal   Collection Time: 05/06/19  8:52 PM  Result Value Ref Range   Prothrombin Time 18.0 (H) 11.4 - 15.2 seconds   INR 1.5 (H) 0.8 - 1.2    Comment: (NOTE) INR goal varies based on device and disease states. Performed at [facility], [place] [address], [place], [place] [address]   APTT     Status: Abnormal   Collection Time: 05/06/19  8:52 PM  Result Value Ref Range   aPTT 118 (H) 24 - 36 seconds    Comment:        IF BASELINE aPTT IS ELEVATED, SUGGEST PATIENT RISK ASSESSMENT BE USED TO DETERMINE APPROPRIATE ANTICOAGULANT THERAPY. Performed at [facility], [place] [address], [place], [place] [address]   D-dimer, quantitative (not at [facility])     Status: Abnormal   Collection Time: 05/06/19  8:52 PM  Result Value Ref Range   D-Dimer, Quant >20.00 (H) 0.00 - 0.50 ug/mL-FEU    Comment: REPEATED TO VERIFY (NOTE) At the manufacturer cut-off of 0.50 ug/mL FEU, this assay has been documented to exclude PE with a sensitivity and negative predictive value of 97 to 99%.  At this time, this assay has not been approved by the FDA to exclude DVT/VTE. Results should be correlated with clinical presentation. Performed at [facility], [place] [address], [place], [place] [address]   Heparin level (unfractionated)     Status: Abnormal   Collection Time: 05/06/19  8:52 PM  Result Value Ref Range   Heparin Unfractionated 0.87 (H) 0.30 - 0.70 IU/mL    Comment: (NOTE) If heparin results are below expected values, and patient dosage has  been confirmed, suggest follow up testing of antithrombin III levels. Performed at [facility], [place] [place] [address] [place], [place] [address]   Urine culture     Status: None   Collection Time: 05/06/19  9:17 PM   Specimen: Urine, Random  Result Value  Ref Range   Specimen Description URINE, RANDOM    Special Requests NONE    Culture      NO GROWTH Performed at [facility], [place] [address] [place], [place] [address]    Report Status 05/07/2019 FINAL   Urinalysis, Routine w reflex microscopic     Status: Abnormal   Collection Time: 05/06/19  9:17 PM  Result Value Ref Range   Color, Urine AMBER (A) YELLOW    Comment: BIOCHEMICALS MAY BE AFFECTED BY COLOR   APPearance CLOUDY (A) CLEAR   Specific Gravity, Urine 1.017 1.005 - 1.030   pH 5.0 5.0 - 8.0   Glucose, UA 50 (A) NEGATIVE mg/dL   Hgb urine dipstick SMALL (A) NEGATIVE   Bilirubin Urine NEGATIVE NEGATIVE   Ketones, ur NEGATIVE NEGATIVE mg/dL   Protein, ur 100 (A) NEGATIVE mg/dL   Nitrite NEGATIVE NEGATIVE  Leukocytes,Ua NEGATIVE NEGATIVE   RBC / HPF 0-5 0 - 5 RBC/hpf   WBC, UA 0-5 0 - 5 WBC/hpf   Bacteria, UA RARE (A) NONE SEEN   Squamous Epithelial / LPF 0-5 0 - 5   Mucus PRESENT    Granular Casts, UA PRESENT    Amorphous Crystal PRESENT     Comment: Performed at [facility], [place] [address] [place], [place] [address]  Strep pneumoniae urinary antigen  (not at [facility])     Status: None   Collection Time: 05/06/19  9:17 PM  Result Value Ref Range   Strep Pneumo Urinary Antigen NEGATIVE NEGATIVE    Comment:        Infection due to S. pneumoniae cannot be absolutely ruled out since the antigen present may be below the detection limit of the test. Performed at [facility], [place] [address], [place], [place] [address]   Legionella Pneumophila Serogp 1 Ur Ag     Status: None   Collection Time: 05/06/19  9:19 PM  Result Value Ref Range   L. pneumophila Serogp 1 Ur Ag Negative Negative    Comment: (NOTE) Presumptive negative for L. pneumophila serogroup 1 antigen in urine, suggesting no recent or current infection. Legionnaires' disease cannot be ruled out since other serogroups and species may also cause disease. Performed At: [facility] [place], [place] [id number] Rush Farmer MD ON:[id number]    Source of Sample URINE, RANDOM     Comment: Performed at [facility], [place] [address], [place], [place] [address]  Glucose, capillary     Status: Abnormal   Collection Time: 05/07/19 12:19 AM  Result Value Ref Range   Glucose-Capillary 133 (H) 70 - 99 mg/dL  Lactic acid, plasma     Status: None   Collection Time: 05/07/19 12:20 AM  Result Value Ref Range   Lactic Acid, Venous 0.9 0.5 - 1.9 mmol/L    Comment: Performed at [facility] [address], [place], [place] [address]  CBC     Status: Abnormal   Collection Time: 05/07/19  3:42 AM  Result Value Ref Range   WBC 9.5 4.0 - 10.5 K/uL   RBC 4.17 (L) 4.22 - 5.81 MIL/uL   Hemoglobin 12.3 (L) 13.0 - 17.0 g/dL   HCT 40.3 39.0 - 52.0 %   MCV 96.6 80.0 - 100.0 fL   MCH 29.5 26.0 - 34.0 pg   MCHC 30.5 30.0 - 36.0 g/dL   RDW 15.5 11.5 - 15.5 %   Platelets 357 150 - 400 K/uL   nRBC 0.2 0.0 - 0.2 %    Comment: Performed at [facility], [place] [address], [place], [place] [address]  Basic metabolic panel     Status: Abnormal   Collection Time: 05/07/19  3:42 AM  Result Value Ref Range   Sodium 140 135 - 145 mmol/L   Potassium 4.8 3.5 - 5.1 mmol/L   Chloride 109 98 - 111 mmol/L   CO2 13 (L) 22 - 32 mmol/L   Glucose, Bld 172 (H) 70 - 99 mg/dL   BUN 42 (H) 8 - 23 mg/dL   Creatinine, Ser 3.81 (H) 0.61 - 1.24 mg/dL   Calcium 6.8 (L) 8.9 - 10.3 mg/dL   GFR calc non Af Amer 16 (L) >60 mL/min   GFR calc Af Amer 18 (L) >60 mL/min   Anion gap 18 (H) 5 - 15    Comment:  Performed at [facility], [place] [address], [place], [place] [address]  Magnesium     Status: None   Collection Time: 05/07/19  3:42 AM  Result Value Ref Range   Magnesium 2.2 1.7 - 2.4 mg/dL    Comment: Performed at [facility] [address], [place], [place] [address]  Phosphorus     Status: Abnormal   Collection Time: 05/07/19  3:42 AM  Result Value  Ref Range   Phosphorus 5.8 (H) 2.5 - 4.6 mg/dL    Comment: Performed at [facility] [address], [place], [place] [address]  Triglycerides     Status: None   Collection Time: 05/07/19  3:42 AM  Result Value Ref Range   Triglycerides 111 <150 mg/dL    Comment: Performed at [facility] [address] [place], [place] [address]  APTT     Status: Abnormal   Collection Time: 05/07/19  3:42 AM  Result Value Ref Range   aPTT 122 (H) 24 - 36 seconds    Comment:        IF BASELINE aPTT IS ELEVATED, SUGGEST PATIENT RISK ASSESSMENT BE USED TO DETERMINE APPROPRIATE ANTICOAGULANT THERAPY. Performed at [facility], [place] [address], [place], [place] [address]   Heparin level (unfractionated)     Status: Abnormal   Collection Time: 05/07/19  3:42 AM  Result Value Ref Range   Heparin Unfractionated 0.97 (H) 0.30 - 0.70 IU/mL    Comment: (NOTE) If heparin results are below expected values, and patient dosage has  been confirmed, suggest follow up testing of antithrombin III levels. Performed at [facility], [place] [address], [place], [place] [address]   I-STAT 7, (LYTES, BLD GAS, ICA, H+H)     Status: Abnormal   Collection Time: 05/07/19  4:00 AM  Result Value Ref Range   pH, Arterial 7.225 (L) 7.350 - 7.450   pCO2 arterial 29.3 (L) 32.0 - 48.0 mmHg   pO2, Arterial 157.0 (H) 83.0 - 108.0 mmHg   Bicarbonate 12.1 (L) 20.0 - 28.0 mmol/L   TCO2 13 (L) 22 - 32 mmol/L   O2 Saturation 99.0 %   Acid-base deficit 14.0 (H) 0.0 - 2.0 mmol/L   Sodium 135 135 - 145 mmol/L   Potassium 4.5 3.5 - 5.1 mmol/L   Calcium, Ion 1.04 (L) 1.15 - 1.40 mmol/L   HCT 44.0 39.0 - 52.0 %   Hemoglobin 15.0 13.0 - 17.0 g/dL   Patient temperature 98.6 F    Collection site RADIAL, ALLEN'S TEST ACCEPTABLE    Drawn by RT    Sample type ARTERIAL   Culture, respiratory (tracheal aspirate)     Status: None (Preliminary result)   Collection Time: 05/07/19 10:24 AM   Specimen: Tracheal  Aspirate; Respiratory  Result Value Ref Range   Specimen Description TRACHEAL ASPIRATE    Special Requests NONE    Gram Stain NO WBC SEEN RARE BUDDING YEAST SEEN     Culture      CULTURE REINCUBATED FOR BETTER GROWTH Performed at [facility], [place] [address], [place], [place] [address]    Report Status PENDING   Glucose, capillary     Status: Abnormal   Collection Time: 05/07/19 12:27 PM  Result Value Ref Range   Glucose-Capillary 141 (H) 70 - 99 mg/dL  APTT     Status: Abnormal   Collection Time: 05/07/19  2:39 PM  Result Value Ref Range  aPTT 67 (H) 24 - 36 seconds    Comment:        IF BASELINE aPTT IS ELEVATED, SUGGEST PATIENT RISK ASSESSMENT BE USED TO DETERMINE APPROPRIATE ANTICOAGULANT THERAPY. Performed at [facility], [place] [address] [place], [place] [address]   Magnesium     Status: None   Collection Time: 05/07/19  2:39 PM  Result Value Ref Range   Magnesium 2.2 1.7 - 2.4 mg/dL    Comment: Performed at [facility], [place] [address], [place], [place] [address]  Phosphorus     Status: Abnormal   Collection Time: 05/07/19  2:39 PM  Result Value Ref Range   Phosphorus 6.7 (H) 2.5 - 4.6 mg/dL    Comment: Performed at [facility] [address] [place], [place] [address]  Basic metabolic panel     Status: Abnormal   Collection Time: 05/07/19  2:39 PM  Result Value Ref Range   Sodium 141 135 - 145 mmol/L   Potassium 4.7 3.5 - 5.1 mmol/L   Chloride 112 (H) 98 - 111 mmol/L   CO2 15 (L) 22 - 32 mmol/L   Glucose, Bld 141 (H) 70 - 99 mg/dL   BUN 44 (H) 8 - 23 mg/dL   Creatinine, Ser 4.62 (H) 0.61 - 1.24 mg/dL   Calcium 6.5 (L) 8.9 - 10.3 mg/dL   GFR calc non Af Amer 12 (L) >60 mL/min   GFR calc Af Amer 14 (L) >60 mL/min   Anion gap 14 5 - 15    Comment: Performed at [facility] [address], [place], [place] [address]  Glucose, capillary     Status: Abnormal   Collection Time: 05/07/19  4:58 PM  Result Value Ref Range    Glucose-Capillary 116 (H) 70 - 99 mg/dL  I-STAT 7, (LYTES, BLD GAS, ICA, H+H)     Status: Abnormal   Collection Time: 05/07/19  6:01 PM  Result Value Ref Range   Pressure control 13 cm H20   pH, Arterial 7.267 (L) 7.350 - 7.450   pCO2 arterial 26.1 (L) 32.0 - 48.0 mmHg   pO2, Arterial 73.0 (L) 83.0 - 108.0 mmHg   Bicarbonate 12.0 (L) 20.0 - 28.0 mmol/L   TCO2 13 (L) 22 - 32 mmol/L   O2 Saturation 93.0 %   Acid-base deficit 14.0 (H) 0.0 - 2.0 mmol/L   Sodium 141 135 - 145 mmol/L   Potassium 4.3 3.5 - 5.1 mmol/L   Calcium, Ion 1.00 (L) 1.15 - 1.40 mmol/L   HCT 29.0 (L) 39.0 - 52.0 %   Hemoglobin 9.9 (L) 13.0 - 17.0 g/dL   Patient temperature 97.4 F    Collection site RADIAL, ALLEN'S TEST ACCEPTABLE    Drawn by Operator    Sample type ARTERIAL   Acetaminophen level     Status: Abnormal   Collection Time: 05/07/19  6:15 PM  Result Value Ref Range   Acetaminophen (Tylenol), Serum <10 (L) 10 - 30 ug/mL    Comment: (NOTE) Therapeutic concentrations vary significantly. A range of 10-30 ug/mL  may be an effective concentration for many patients. However, some  are best treated at concentrations outside of this range. Acetaminophen concentrations >150 ug/mL at 4 hours after ingestion  and >50 ug/mL at 12 hours after ingestion are often associated with  toxic reactions. Performed at [facility], [place] [address] [place], [place] [address] CORRECTED ON 10/08 AT 1946: PREVIOUSLY REPORTED AS <10  Salicylate level     Status: None   Collection Time: 05/07/19  6:15 PM  Result Value Ref Range   Salicylate Lvl <3.1 2.8 - 30.0 mg/dL    Comment: Performed at [facility] [address], [place], [place] [address]  Basic metabolic panel     Status: Abnormal   Collection Time: 05/08/19 12:04 AM  Result Value Ref Range   Sodium 138 135 - 145 mmol/L   Potassium 4.0 3.5 - 5.1 mmol/L   Chloride 110 98 - 111 mmol/L   CO2 13 (L) 22 - 32 mmol/L   Glucose, Bld 100 (H) 70 - 99 mg/dL    BUN 46 (H) 8 - 23 mg/dL   Creatinine, Ser 4.99 (H) 0.61 - 1.24 mg/dL   Calcium 6.6 (L) 8.9 - 10.3 mg/dL   GFR calc non Af Amer 11 (L) >60 mL/min   GFR calc Af Amer 13 (L) >60 mL/min   Anion gap 15 5 - 15    Comment: Performed at [facility] [address], [place], [place] [address]  Glucose, capillary     Status: None   Collection Time: 05/08/19 12:04 AM  Result Value Ref Range   Glucose-Capillary 92 70 - 99 mg/dL  CBC     Status: Abnormal   Collection Time: 05/08/19  3:44 AM  Result Value Ref Range   WBC 8.8 4.0 - 10.5 K/uL   RBC 3.44 (L) 4.22 - 5.81 MIL/uL   Hemoglobin 10.0 (L) 13.0 - 17.0 g/dL   HCT 33.3 (L) 39.0 - 52.0 %   MCV 96.8 80.0 - 100.0 fL   MCH 29.1 26.0 - 34.0 pg   MCHC 30.0 30.0 - 36.0 g/dL   RDW 15.7 (H) 11.5 - 15.5 %   Platelets 230 150 - 400 K/uL   nRBC 0.0 0.0 - 0.2 %    Comment: Performed at [facility], [place] [address], [place], [place] [address]  APTT     Status: Abnormal   Collection Time: 05/08/19  3:44 AM  Result Value Ref Range   aPTT 84 (H) 24 - 36 seconds    Comment:        IF BASELINE aPTT IS ELEVATED, SUGGEST PATIENT RISK ASSESSMENT BE USED TO DETERMINE APPROPRIATE ANTICOAGULANT THERAPY. Performed at [facility], [place] [address] [place], [place] [address]   Heparin level (unfractionated)     Status: None   Collection Time: 05/08/19  3:44 AM  Result Value Ref Range   Heparin Unfractionated 0.43 0.30 - 0.70 IU/mL    Comment: (NOTE) If heparin results are below expected values, and patient dosage has  been confirmed, suggest follow up testing of antithrombin III levels. Performed at [facility], [place] [address] [place], [place] [address]   Magnesium     Status: None   Collection Time: 05/08/19  3:44 AM  Result Value Ref Range   Magnesium 1.7 1.7 - 2.4 mg/dL    Comment: Performed at [facility] [address] [place],  [address]  Phosphorus     Status: Abnormal   Collection Time: 05/08/19  3:44  AM  Result Value Ref Range   Phosphorus 5.2 (H) 2.5 - 4.6 mg/dL    Comment: Performed at [facility] [address], [place],  [address]  Hepatic function panel     Status: Abnormal   Collection Time: 05/08/19  3:44 AM  Result Value Ref Range  Total Protein 5.2 (L) 6.5 - 8.1 g/dL   Albumin 2.2 (L) 3.5 - 5.0 g/dL   AST 164 (H) 15 - 41 U/L   ALT 187 (H) 0 - 44 U/L   Alkaline Phosphatase 61 38 - 126 U/L   Total Bilirubin 1.0 0.3 - 1.2 mg/dL   Bilirubin, Direct 0.2 0.0 - 0.2 mg/dL   Indirect Bilirubin 0.8 0.3 - 0.9 mg/dL    Comment: Performed at [facility] [address], [place], [place] [address]  Protime-INR     Status: Abnormal   Collection Time: 05/08/19  3:44 AM  Result Value Ref Range   Prothrombin Time 17.5 (H) 11.4 - 15.2 seconds   INR 1.5 (H) 0.8 - 1.2    Comment: (NOTE) INR goal varies based on device and disease states. Performed at [facility], [place] [address] [place], [place] [address]   Glucose, capillary     Status: None   Collection Time: 05/08/19  3:46 AM  Result Value Ref Range   Glucose-Capillary 97 70 - 99 mg/dL  Glucose, capillary     Status: Abnormal   Collection Time: 05/08/19 12:13 PM  Result Value Ref Range   Glucose-Capillary 180 (H) 70 - 99 mg/dL    ECG   Junctional rhythm at 58, inferior and anterolateral TW changes, prolonged QTc 561 msec - Personally Reviewed (10/8)  Telemetry   Sinus rhythm in the 80's - Personally Reviewed  Radiology    Dg Chest Port 1 View  Result Date: 05/07/2019 CLINICAL DATA:  Endotracheal tube and central line placement. EXAM: PORTABLE CHEST 1 VIEW COMPARISON:  Radiograph of May 06, 2019. FINDINGS: Stable cardiomediastinal silhouette. Endotracheal and nasogastric tubes are unchanged in position. Right internal jugular catheter is unchanged with distal tip in expected position of the SVC. No pneumothorax is noted. Stable bibasilar opacities are noted. No pleural effusion is noted. Bony  thorax unremarkable. IMPRESSION: Grossly stable support apparatus. Stable bibasilar opacities as described above. Electronically Signed   By: Marijo Conception M.D.   On: 05/07/2019 07:49   Dg Chest Port 1 View  Result Date: 05/06/2019 CLINICAL DATA:  Check endotracheal tube placement EXAM: PORTABLE CHEST 1 VIEW COMPARISON:  05/06/2019 FINDINGS: Endotracheal tube, gastric catheter and right jugular central line are noted in satisfactory position. No pneumothorax is noted. Patchy bilateral infiltrates are again seen but slightly improved when compared with the prior study. No sizable effusion is noted. No bony abnormality is seen. IMPRESSION: No pneumothorax following central line placement. Tubes and lines as described above. Bilateral patchy infiltrates slightly improved from the prior study. Electronically Signed   By: Inez Catalina M.D.   On: 05/06/2019 19:46   Vas [place] Lower Extremity Venous (dvt)  Result Date: 05/07/2019  Lower Venous Study Indications: COVID positive.  Limitations: Posterior acoustic shadowing from arterial calcification. Comparison Study: No prior study. Performing Technologist: Maudry Mayhew MHA, RDMS, RVT, RDCS  Examination Guidelines: A complete evaluation includes B-mode imaging, spectral Doppler, color Doppler, and power Doppler as needed of all accessible portions of each vessel. Bilateral testing is considered an integral part of a complete examination. Limited examinations for reoccurring indications may be performed as noted.  +---------+---------------+---------+-----------+----------+--------------+  RIGHT     Compressibility Phasicity Spontaneity Properties Thrombus Aging  +---------+---------------+---------+-----------+----------+--------------+  CFV       Full                                                             +---------+---------------+---------+-----------+----------+--------------+  FV Prox   Full                                                              +---------+---------------+---------+-----------+----------+--------------+  FV Distal Full                                                             +---------+---------------+---------+-----------+----------+--------------+  POP       Full                                                             +---------+---------------+---------+-----------+----------+--------------+  PTV       Full                                                             +---------+---------------+---------+-----------+----------+--------------+  PERO      Full                                                             +---------+---------------+---------+-----------+----------+--------------+  +---------+---------------+---------+-----------+----------+--------------+  LEFT      Compressibility Phasicity Spontaneity Properties Thrombus Aging  +---------+---------------+---------+-----------+----------+--------------+  CFV       Full            Yes       Yes                                    +---------+---------------+---------+-----------+----------+--------------+  SFJ       Full                                                             +---------+---------------+---------+-----------+----------+--------------+  FV Prox   Full                                                             +---------+---------------+---------+-----------+----------+--------------+  FV Distal Full                                                             +---------+---------------+---------+-----------+----------+--------------+  POP       Full            Yes       Yes                                    +---------+---------------+---------+-----------+----------+--------------+  PTV       Full                                                             +---------+---------------+---------+-----------+----------+--------------+  PERO      Full                                                              +---------+---------------+---------+-----------+----------+--------------+  Summary: Right: There is no evidence of deep vein thrombosis in the lower extremity. However, portions of this examination were limited- see technologist comments above. No cystic structure found in the popliteal fossa. Left: There is no evidence of deep vein thrombosis in the lower extremity. However, portions of this examination were limited- see technologist comments above. No cystic structure found in the popliteal fossa.  *See table(s) above for measurements and observations. Electronically signed by Monica Martinez MD on 05/07/2019 at 5:07:11 PM.    Final     Cardiac Studies   Procedure: 2D Echo, Cardiac Doppler and Color Doppler  Indications:    Cardiac Arrest I46.9   History:        Patient has prior history of Echocardiogram examinations, most                 recent 03/27/2019. Cardiomyopathy, CAD and Previous Myocardial                 Infarction, Prior CABG; Arrythmias:Atrial Fibrillation. COVID 19                 Positive. CKD. GERD.   Sonographer:    Tiffany Dance Referring Phys: [id number] WESAM G Lucky    1. Left ventricular ejection fraction, by visual estimation, is 45 to 50%. The left ventricle has mildly decreased function. Normal left ventricular size. There is mildly increased left ventricular hypertrophy.  2. Left ventricular diastolic Doppler parameters are indeterminate pattern of LV diastolic filling.  3. Difficult to visualize LV contours.  4. Global right ventricle has normal systolic function.The right ventricular size is normal. No increase in right ventricular wall thickness.  5. Left atrial size was severely dilated.  6. Right atrial size was moderately dilated.  7. The mitral valve is normal in structure. Mild mitral valve regurgitation. No evidence of mitral stenosis.  8. The tricuspid valve is normal in structure. Tricuspid valve regurgitation is mild.  9. The aortic  valve is tricuspid Aortic valve regurgitation was not visualized by color flow Doppler. Mild aortic valve sclerosis without stenosis. 10. The pulmonic valve was normal in structure. Pulmonic valve regurgitation is not visualized by color flow Doppler. 11. Moderately elevated pulmonary artery systolic pressure. 12. The inferior vena cava is normal in size with greater than 50%  respiratory variability, suggesting right atrial pressure of 3 mmHg.  Assessment   Active Problems:   Unspecified atrial fibrillation (HCC)   Acute respiratory failure with hypoxemia (HCC)   Cardiac arrest (HCC)   Prolonged QT interval   Plan   1. Has converted to sinus - noted to have prolonged QTc on EKG yesterday. Off amiodarone. Could transition diltiazem to oral for rate control. LVEF not worse compared to prior echo -etiology of VT is unclear, but could be scar mediated given prior CABG or perhaps a sequelae of recent COVID infection. At this point, further ischemic evaluation cannot be undertaken until renal recovery occurs. Continue supportive care and IV heparin. Avoid QT prolonging medications and follow QTc on EKG as it is prolonged on EKG yesterday.  Time Spent Directly with Patient:  I have spent a total of 25 minutes with the patient reviewing hospital notes, telemetry, EKGs, labs and examining the patient as well as establishing an assessment and plan that was discussed personally with the patient.  > 50% of time was spent in direct patient care.  Length of Stay:  LOS: 2 days   [name], MD, [facility], [place] Director of the Advanced Lipid Disorders &  Cardiovascular Risk Reduction Clinic Diplomate of the American Board of Clinical Lipidology Attending Cardiologist  Direct Dial: [phone]   Fax: [phone]  Website:  [email or web address] 05/08/2019, 3:34 PM

## 2019-05-08 NOTE — Progress Notes (Signed)
.    Patient noted to be back in afib with RVR today - HR sustained in the 130-140's on 15 mg/hr diltiazem. Will give 5 mg IV lopressor now and start 25 mg po BID lopressor this evening. Continue IV cardizem.  [name], MD, [facility] [place] Director of the Advanced Lipid Disorders &  Cardiovascular Risk Reduction Clinic Diplomate of the American Board of Clinical Lipidology Attending Cardiologist  Direct Dial: [phone]  Fax: [phone]  Website:  [email or web address]

## 2019-05-09 LAB — GLUCOSE, CAPILLARY
Glucose-Capillary: 149 mg/dL — ABNORMAL HIGH (ref 70–99)
Glucose-Capillary: 154 mg/dL — ABNORMAL HIGH (ref 70–99)
Glucose-Capillary: 158 mg/dL — ABNORMAL HIGH (ref 70–99)
Glucose-Capillary: 166 mg/dL — ABNORMAL HIGH (ref 70–99)
Glucose-Capillary: 179 mg/dL — ABNORMAL HIGH (ref 70–99)
Glucose-Capillary: 190 mg/dL — ABNORMAL HIGH (ref 70–99)
Glucose-Capillary: 207 mg/dL — ABNORMAL HIGH (ref 70–99)

## 2019-05-09 LAB — MAGNESIUM: Magnesium: 1.5 mg/dL — ABNORMAL LOW (ref 1.7–2.4)

## 2019-05-09 LAB — CBC
HCT: 31.3 % — ABNORMAL LOW (ref 39.0–52.0)
Hemoglobin: 10 g/dL — ABNORMAL LOW (ref 13.0–17.0)
MCH: 29.2 pg (ref 26.0–34.0)
MCHC: 31.9 g/dL (ref 30.0–36.0)
MCV: 91.5 fL (ref 80.0–100.0)
Platelets: 230 10*3/uL (ref 150–400)
RBC: 3.42 MIL/uL — ABNORMAL LOW (ref 4.22–5.81)
RDW: 15.7 % — ABNORMAL HIGH (ref 11.5–15.5)
WBC: 8.3 10*3/uL (ref 4.0–10.5)
nRBC: 0 % (ref 0.0–0.2)

## 2019-05-09 LAB — CULTURE, RESPIRATORY W GRAM STAIN
Culture: NORMAL
Gram Stain: NONE SEEN

## 2019-05-09 LAB — COMPREHENSIVE METABOLIC PANEL
ALT: 116 U/L — ABNORMAL HIGH (ref 0–44)
AST: 52 U/L — ABNORMAL HIGH (ref 15–41)
Albumin: 2.1 g/dL — ABNORMAL LOW (ref 3.5–5.0)
Alkaline Phosphatase: 56 U/L (ref 38–126)
Anion gap: 14 (ref 5–15)
BUN: 48 mg/dL — ABNORMAL HIGH (ref 8–23)
CO2: 20 mmol/L — ABNORMAL LOW (ref 22–32)
Calcium: 7.3 mg/dL — ABNORMAL LOW (ref 8.9–10.3)
Chloride: 105 mmol/L (ref 98–111)
Creatinine, Ser: 5.17 mg/dL — ABNORMAL HIGH (ref 0.61–1.24)
GFR calc Af Amer: 13 mL/min — ABNORMAL LOW (ref >60)
GFR calc non Af Amer: 11 mL/min — ABNORMAL LOW (ref >60)
Glucose, Bld: 168 mg/dL — ABNORMAL HIGH (ref 70–99)
Potassium: 3.5 mmol/L (ref 3.5–5.1)
Sodium: 139 mmol/L (ref 135–145)
Total Bilirubin: 0.9 mg/dL (ref 0.3–1.2)
Total Protein: 5 g/dL — ABNORMAL LOW (ref 6.5–8.1)

## 2019-05-09 LAB — HEPARIN LEVEL (UNFRACTIONATED): Heparin Unfractionated: 0.3 IU/mL (ref 0.30–0.70)

## 2019-05-09 LAB — PHOSPHORUS: Phosphorus: 4.5 mg/dL (ref 2.5–4.6)

## 2019-05-09 MED ORDER — ALPRAZOLAM 0.25 MG PO TABS
0.2500 mg | ORAL_TABLET | Freq: Once | ORAL | Status: AC
  Administered 2019-05-09: 0.25 mg via ORAL
  Filled 2019-05-09: qty 1

## 2019-05-09 MED ORDER — BUDESONIDE 180 MCG/ACT IN AEPB
1.0000 | INHALATION_SPRAY | Freq: Two times a day (BID) | RESPIRATORY_TRACT | Status: DC
  Administered 2019-05-09 – 2019-05-20 (×22): 1 via RESPIRATORY_TRACT
  Filled 2019-05-09: qty 1

## 2019-05-09 MED ORDER — PIPERACILLIN-TAZOBACTAM IN DEX 2-0.25 GM/50ML IV SOLN
2.2500 g | Freq: Three times a day (TID) | INTRAVENOUS | Status: DC
  Administered 2019-05-09 – 2019-05-10 (×4): 2.25 g via INTRAVENOUS
  Filled 2019-05-09 (×5): qty 50

## 2019-05-09 MED ORDER — MAGNESIUM SULFATE 2 GM/50ML IV SOLN
2.0000 g | Freq: Once | INTRAVENOUS | Status: AC
  Administered 2019-05-09: 2 g via INTRAVENOUS
  Filled 2019-05-09: qty 50

## 2019-05-09 NOTE — Progress Notes (Signed)
Table bath set up for pt. Pt sat up in bed while washing. Pt while lifting self up on bed for positioning stated felt dizzy at that moment. Pt informed not to get oob to reposition. Hr noted to increase to 130's for less than 5-10 seconds, and then  gradually came down as activity began to cease. Completed bathing. Pt safe, clean and relaxed, hr noted at 93.

## 2019-05-09 NOTE — Progress Notes (Signed)
Patient requested medication for sleep. Stated he took Valium and another med whose name he could not recall as needed at home. MD notified. Acknowledged. Ambien ordered. Patient restless and awake for majority of shift. Did seem to fall asleep towards early morning. Would like to different medicine if possible.  On initial assessment patient CVC site saturated with bright red blood. IV team notified. Instructed to place gelfoam and tape. Unable to apply manual pressure due to placement of site. Bleeding resolved. Will monitor.

## 2019-05-09 NOTE — Progress Notes (Signed)
Pharmacy Antibiotic Note  Dylan Goodman is a 68 y.o. male admitted on 05/06/2019 with seizure (and suspected PE) after recent discharge from [place] for covid(+).  Pharmacy has been consulted for Zosyn dosing for pneumonia.  AKI on CKD with worsening renal function since admission, now CrCl<20 ml/min (using adj BW). WBC WNL, patient afebrile. Last CXR on 10/8 notes stable bibasilar opacities.   Plan: Change Zosyn to 2.25g IV q8h Monitor clinical picture, renal function F/U C&S, abx de-escalation, LOT   Height: 5\' 11"  (180.3 cm) Weight: 226 lb 6.6 oz (102.7 kg) IBW/kg (Calculated) : 75.3  Temp (24hrs), Avg:97.8 F (36.6 C), Min:97.6 F (36.4 C), Max:98 F (36.7 C)  Recent Labs  Lab 05/05/19 2114 05/06/19 1205 05/06/19 2052 05/07/19 0020 05/07/19 0342 05/07/19 1439 05/08/19 0004 05/08/19 0344 05/09/19 0510  WBC  --  4.7 11.1*  --  9.5  --   --  8.8 8.3  CREATININE  --  2.83* 3.63*  --  3.81* 4.62* 4.99*  --  5.17*  LATICACIDVEN 1.2  --  1.1 0.9  --   --   --   --   --     Estimated Creatinine Clearance: 17.4 mL/min (A) (by C-G formula based on SCr of 5.17 mg/dL (H)).    No Known Allergies  Antimicrobials this admission: 10/7 zosyn >>  10/6 vanc>>10/8 Cefepime 10/6>>10/7  Microbiology results: 10/8 Trach aspirate> pending 10/7 UCx neg 10/7 Bcx: ngtd 10/6 BCx ngtd 10/7 MRSA PCR: neg 10/5 covid positive  Brendolyn Patty, PharmD PGY2 Pharmacy Resident Phone [phone]  05/09/2019   8:25 AM

## 2019-05-09 NOTE — Progress Notes (Addendum)
Atrial fibrillation is now rate controlled with ventricular rates in 70'. Continue iv cardizem for now.  Ena Dawley

## 2019-05-09 NOTE — Consult Note (Signed)
[facility] Renal Consultation Note  Requesting MD: Adhikari Indication for Consultation: A on CRF   HPI: Dylan Goodman is a 68 y.o. male with past medical history significant for ischemic cardiomyopathy-ejection fraction recently 57 to 45%-status post CABG, diabetes mellitus, chronic A. fib on Eliquis.  He also has chronic diarrhea.  He was diagnosed last month with COVID-hospitalized at [facility] from 9/14 to 9/19-treated with steroids and Remdesivir.  He presented on 10/5 to the emergency room at [facility] with complaints of chest pain-cardiac work-up was negative, he was given IV fluids and sent home.  Then, on 10/6 he called EMS-shortly thereafter had a tonic-clonic seizure-brought to [facility] and he again was COVID positive and admitted.  On 10/7 he had an acute tachyarrhythmia-VT versus A. fib with RVR followed by cardiac arrest.  Fortunately he recovered-did spend time in the ICU but then was transferred to 2 W. regarding his kidney function, he appears to have CKD.  The best his creatinine is been in the last 6 months is 1.9 and that was on day of discharge from [facility].  Upon presentation to [facility] , creatinine was between 2.8 and 3 until the events of 10/7-then creatinine rose steadily and is at a 5.17 today.  There was hypotension and pressor requirements around the time of 10/7, now improved.  There was a period of decreased urine output but he was noted to make 1400 last 24 hours.  Had acidosis- bicarb based fluids were initiated and that part of things is improved.  It appears that his nasal cannula requirement has increased the last 24 hours from 2 to 3 L with occasional desats-blood pressure around 110s.  He went back into A. fib with RVR and needed to be started on diltiazem.  There do not appear to be other indications for dialysis, potassium 3.5.  He is noted to be close to 8 L positive this admission.  Urinalysis a few days back-100 of protein, no cells-  renal ultrasound ordered   Creatinine  Date/Time Value Ref Range Status  03/25/2019 2.6 (A) 0.6 - 1.3 Final   Creat  Date/Time Value Ref Range Status  02/12/2019 02:50 PM 3.36 (H) 0.70 - 1.25 mg/dL Final    Comment:    For patients >33 years of age, the reference limit for Creatinine is approximately 13% higher for people identified as African-American. Marland Kitchen   09/08/2018 12:03 PM 2.93 (H) 0.70 - 1.25 mg/dL Final    Comment:    For patients >79 years of age, the reference limit for Creatinine is approximately 13% higher for people identified as African-American. .    Creatinine, Ser  Date/Time Value Ref Range Status  05/09/2019 05:10 AM 5.17 (H) 0.61 - 1.24 mg/dL Final  05/08/2019 12:04 AM 4.99 (H) 0.61 - 1.24 mg/dL Final  05/07/2019 02:39 PM 4.62 (H) 0.61 - 1.24 mg/dL Final  05/07/2019 03:42 AM 3.81 (H) 0.61 - 1.24 mg/dL Final  05/06/2019 08:52 PM 3.63 (H) 0.61 - 1.24 mg/dL Final  05/06/2019 12:05 PM 2.83 (H) 0.61 - 1.24 mg/dL Final  05/05/2019 06:32 PM 3.06 (H) 0.61 - 1.24 mg/dL Final  05/04/2019 03:02 PM 2.58 (H) 0.61 - 1.24 mg/dL Final  05/04/2019 10:02 AM 2.85 (H) 0.61 - 1.24 mg/dL Final  04/18/2019 01:56 AM 1.93 (H) 0.61 - 1.24 mg/dL Final  04/17/2019 01:44 AM 2.28 (H) 0.61 - 1.24 mg/dL Final  04/16/2019 02:13 AM 2.52 (H) 0.61 - 1.24 mg/dL Final  04/15/2019 01:58 AM 2.86 (H) 0.61 -  1.24 mg/dL Final  04/14/2019 02:15 AM 3.52 (H) 0.61 - 1.24 mg/dL Final  04/13/2019 02:30 AM 5.03 (H) 0.61 - 1.24 mg/dL Final  04/12/2019 04:58 PM 5.93 (H) 0.61 - 1.24 mg/dL Final  04/05/2019 12:16 PM 2.09 (H) 0.61 - 1.24 mg/dL Final  03/13/2019 08:48 AM 2.95 (H) 0.61 - 1.24 mg/dL Final  03/12/2019 07:14 AM 2.87 (H) 0.61 - 1.24 mg/dL Final  02/27/2019 09:48 AM 3.23 (H) 0.61 - 1.24 mg/dL Final  11/10/2018 09:33 AM 2.74 (H) 0.61 - 1.24 mg/dL Final     PMHx:   Past Medical History:  Diagnosis Date  . Atrial fibrillation ([place])   . Bleeding gastrointestinal   . CAD (coronary artery  disease)    Status post CABG and PCI  . 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 Cardiomyopathy ([place])   . Chronic depression   . CKD (chronic kidney disease)   . Diabetes mellitus ([place])   . Gastric ulcer with hemorrhage   . Gastritis   . GERD (gastroesophageal reflux disease)   . Hx of CABG 12/1994  . IDA (iron deficiency anemia)   . 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 Myocardial infarction ([place]) 2017  . Personal history of radiation therapy 05/30/2018   39 treatments with chemotherapy nasopharyngeal cancer  . 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 CAROTID ENDARTERECTOMY  2006  . COLONOSCOPY WITH PROPOFOL N/A 03/11/2019   Procedure: COLONOSCOPY WITH PROPOFOL;  Surgeon: Toledo, Benay Pike, MD;  Location: [facility] ENDOSCOPY;  Service: Gastroenterology;  Laterality: N/A;  . CORONARY ARTERY BYPASS GRAFT  1996   quadruple bypass  . ESOPHAGOGASTRODUODENOSCOPY (EGD) WITH PROPOFOL N/A 03/11/2019   Procedure: ESOPHAGOGASTRODUODENOSCOPY (EGD) WITH PROPOFOL;  Surgeon: Toledo, Benay Pike, MD;  Location: [facility] ENDOSCOPY;  Service: Gastroenterology;  Laterality: N/A;  . FEMORAL-POPLITEAL BYPASS GRAFT Right 06/1998   11/2001, 08/2003  . 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 LOWER EXTREMITY ANGIOGRAPHY Left 11/11/2018   Procedure: LOWER EXTREMITY ANGIOGRAPHY;  Surgeon: Katha Cabal, MD;  Location: [place] [facility];  Service: Cardiovascular;  Laterality: Left;  . REVISION OF AORTA BIFEMORAL BYPASS    . Stent revascularization left leg  08/2009  . Stent revascularization right leg  07/2009    Family Hx:  Family History  Problem Relation Age of Onset  . Coronary artery disease Mother   . Alcohol abuse Father   . Colon cancer Father   . Coronary artery disease Father   . Kidney cancer Father   . Breast cancer Sister     Social History:  reports that he quit smoking about 2 years  ago. His smoking use included cigarettes. He has a 75.00 pack-year smoking history. He has never used smokeless tobacco. He reports previous alcohol use. He reports that he does not use drugs.  Allergies: No Known Allergies  Medications: Prior to Admission medications   Medication Sig Start Date End Date Taking? Authorizing Provider  apixaban (ELIQUIS) 5 MG TABS tablet Take 1 tablet (5 mg total) by mouth 2 (two) times daily. 04/18/19  Yes Patrecia Pour, MD  budesonide-formoterol [facility]) 160-4.5 MCG/ACT inhaler Inhale 2 puffs into the lungs 2 (two) times daily. Rinse mouth after use 03/17/19  Yes Laverle Hobby, MD  metoprolol tartrate (LOPRESSOR) 100 MG tablet Take 1 tablet (100 mg total) by mouth 2 (two) times daily. 04/18/19  Yes Patrecia Pour,  MD  Calcium Carbonate-Vitamin D3 (CALCIUM 600-D) 600-400 MG-UNIT TABS Take 1 tablet by mouth daily.    [provider]  Cholecalciferol (VITAMIN D3) 25 MCG (1000 UT) CAPS Take by mouth.    [provider]  cilostazol (PLETAL) 100 MG tablet Take 100 mg by mouth 2 (two) times daily.     [provider]  colchicine 0.6 MG tablet Take 0.6 mg by mouth once.    [provider]  diphenoxylate-atropine (LOMOTIL) 2.5-0.025 MG tablet Take 1 tablet by mouth 4 (four) times daily as needed for diarrhea or loose stools.    [provider]  ezetimibe (ZETIA) 10 MG tablet Take 10 mg by mouth daily.  07/07/18   [provider]  fenofibrate 160 MG tablet Take 160 mg by mouth daily.     [provider]  Insulin Degludec (TRESIBA FLEXTOUCH) 200 UNIT/ML SOPN Inject 12 Units into the skin at bedtime.  05/17/18   [provider]  isosorbide mononitrate (IMDUR) 120 MG 24 hr tablet Take 120 mg by mouth daily.    [provider]  lansoprazole (PREVACID) 30 MG capsule Take 30 mg by mouth every morning.     [provider]  losartan (COZAAR) 25 MG tablet Take 25 mg by mouth daily.   08/29/18   [provider]  lovastatin (MEVACOR) 40 MG tablet Take 40 mg by mouth 2 (two) times daily.  07/01/18   [provider]  meclizine (ANTIVERT) 12.5 MG tablet Take 1 tablet (12.5 mg total) by mouth 3 (three) times daily as needed for dizziness. 04/05/19   Nena Polio, MD  nitroGLYCERIN (NITROSTAT) 0.4 MG SL tablet Place 0.4 mg under the tongue every 5 (five) minutes as needed for chest pain.  07/01/18   [provider]  sodium bicarbonate 650 MG tablet Take 1 tablet by mouth daily.  12/31/18   Kolluru, Lurena Nida, MD  ticagrelor (BRILINTA) 60 MG TABS tablet Take 60 mg by mouth 2 (two) times daily.     [provider]    I have reviewed the patient's current medications.  Labs:  Results for orders placed or performed during the hospital encounter of 05/06/19 (from the past 48 hour(s))  Glucose, capillary     Status: Abnormal   Collection Time: 05/07/19 12:27 PM  Result Value Ref Range   Glucose-Capillary 141 (H) 70 - 99 mg/dL  APTT     Status: Abnormal   Collection Time: 05/07/19  2:39 PM  Result Value Ref Range   aPTT 67 (H) 24 - 36 seconds    Comment:        IF BASELINE aPTT IS ELEVATED, SUGGEST PATIENT RISK ASSESSMENT BE USED TO DETERMINE APPROPRIATE ANTICOAGULANT THERAPY. Performed at [facility], [place] [address], [place], [place] [address]   Magnesium     Status: None   Collection Time: 05/07/19  2:39 PM  Result Value Ref Range   Magnesium 2.2 1.7 - 2.4 mg/dL    Comment: Performed at [facility], [place] [address], [place], [place] [address]  Phosphorus     Status: Abnormal   Collection Time: 05/07/19  2:39 PM  Result Value Ref Range   Phosphorus 6.7 (H) 2.5 - 4.6 mg/dL    Comment: Performed at [facility] [address], [place], [place] [address]  Basic metabolic panel     Status: Abnormal   Collection Time: 05/07/19  2:39 PM  Result Value Ref Range   Sodium  141 135 - 145 mmol/L   Potassium 4.7 3.5 - 5.1 mmol/L    Chloride 112 (H) 98 - 111 mmol/L   CO2 15 (L) 22 - 32 mmol/L   Glucose, Bld 141 (H) 70 - 99 mg/dL   BUN 44 (H) 8 - 23 mg/dL   Creatinine, Ser 4.62 (H) 0.61 - 1.24 mg/dL   Calcium 6.5 (L) 8.9 - 10.3 mg/dL   GFR calc non Af Amer 12 (L) >60 mL/min   GFR calc Af Amer 14 (L) >60 mL/min   Anion gap 14 5 - 15    Comment: Performed at [facility] [address], [place], [place] [address]  Glucose, capillary     Status: Abnormal   Collection Time: 05/07/19  4:58 PM  Result Value Ref Range   Glucose-Capillary 116 (H) 70 - 99 mg/dL  I-STAT 7, (LYTES, BLD GAS, ICA, H+H)     Status: Abnormal   Collection Time: 05/07/19  6:01 PM  Result Value Ref Range   Pressure control 13 cm H20   pH, Arterial 7.267 (L) 7.350 - 7.450   pCO2 arterial 26.1 (L) 32.0 - 48.0 mmHg   pO2, Arterial 73.0 (L) 83.0 - 108.0 mmHg   Bicarbonate 12.0 (L) 20.0 - 28.0 mmol/L   TCO2 13 (L) 22 - 32 mmol/L   O2 Saturation 93.0 %   Acid-base deficit 14.0 (H) 0.0 - 2.0 mmol/L   Sodium 141 135 - 145 mmol/L   Potassium 4.3 3.5 - 5.1 mmol/L   Calcium, Ion 1.00 (L) 1.15 - 1.40 mmol/L   HCT 29.0 (L) 39.0 - 52.0 %   Hemoglobin 9.9 (L) 13.0 - 17.0 g/dL   Patient temperature 97.4 F    Collection site RADIAL, ALLEN'S TEST ACCEPTABLE    Drawn by Operator    Sample type ARTERIAL   Acetaminophen level     Status: Abnormal   Collection Time: 05/07/19  6:15 PM  Result Value Ref Range   Acetaminophen (Tylenol), Serum <10 (L) 10 - 30 ug/mL    Comment: (NOTE) Therapeutic concentrations vary significantly. A range of 10-30 ug/mL  may be an effective concentration for many patients. However, some  are best treated at concentrations outside of this range. Acetaminophen concentrations >150 ug/mL at 4 hours after ingestion  and >50 ug/mL at 12 hours after ingestion are often associated with  toxic reactions. Performed at [facility], [place] [address] [place], [place] [address] CORRECTED ON 10/08 AT 1946: PREVIOUSLY REPORTED AS  <60   Salicylate level     Status: None   Collection Time: 05/07/19  6:15 PM  Result Value Ref Range   Salicylate Lvl <1.0 2.8 - 30.0 mg/dL    Comment: Performed at [facility] [address] [place], [place] [address]  Basic metabolic panel     Status: Abnormal   Collection Time: 05/08/19 12:04 AM  Result Value Ref Range   Sodium 138 135 - 145 mmol/L   Potassium 4.0 3.5 - 5.1 mmol/L   Chloride 110 98 - 111 mmol/L   CO2 13 (L) 22 - 32 mmol/L   Glucose, Bld 100 (H) 70 - 99 mg/dL   BUN 46 (H) 8 - 23 mg/dL   Creatinine, Ser 4.99 (H) 0.61 - 1.24 mg/dL   Calcium 6.6 (L) 8.9 - 10.3 mg/dL   GFR calc non Af Amer 11 (L) >60 mL/min   GFR calc Af Amer 13 (L) >60 mL/min   Anion gap 15  5 - 15    Comment: Performed at [facility], [place] [address] [place] [address] [place], [place] [address]  Glucose, capillary     Status: None   Collection Time: 05/08/19 12:04 AM  Result Value Ref Range   Glucose-Capillary 92 70 - 99 mg/dL  CBC     Status: Abnormal   Collection Time: 05/08/19  3:44 AM  Result Value Ref Range   WBC 8.8 4.0 - 10.5 K/uL   RBC 3.44 (L) 4.22 - 5.81 MIL/uL   Hemoglobin 10.0 (L) 13.0 - 17.0 g/dL   HCT 33.3 (L) 39.0 - 52.0 %   MCV 96.8 80.0 - 100.0 fL   MCH 29.1 26.0 - 34.0 pg   MCHC 30.0 30.0 - 36.0 g/dL   RDW 15.7 (H) 11.5 - 15.5 %   Platelets 230 150 - 400 K/uL   nRBC 0.0 0.0 - 0.2 %    Comment: Performed at [facility], [place] [address], [place], [place] [address]  APTT     Status: Abnormal   Collection Time: 05/08/19  3:44 AM  Result Value Ref Range   aPTT 84 (H) 24 - 36 seconds    Comment:        IF BASELINE aPTT IS ELEVATED, SUGGEST PATIENT RISK ASSESSMENT BE USED TO DETERMINE APPROPRIATE ANTICOAGULANT THERAPY. Performed at [facility], [place] [address], [place], [place] [address]   Heparin level (unfractionated)     Status: None   Collection Time: 05/08/19  3:44 AM  Result Value Ref Range   Heparin Unfractionated 0.43 0.30 - 0.70 IU/mL    Comment:  (NOTE) If heparin results are below expected values, and patient dosage has  been confirmed, suggest follow up testing of antithrombin III levels. Performed at [facility], [place] [address] [place], [place] [address]   Magnesium     Status: None   Collection Time: 05/08/19  3:44 AM  Result Value Ref Range   Magnesium 1.7 1.7 - 2.4 mg/dL    Comment: Performed at [facility] [address] [place], [place] [address]  Phosphorus     Status: Abnormal   Collection Time: 05/08/19  3:44 AM  Result Value Ref Range   Phosphorus 5.2 (H) 2.5 - 4.6 mg/dL    Comment: Performed at [facility] [address], [place], [place] [address]  Hepatic function panel     Status: Abnormal   Collection Time: 05/08/19  3:44 AM  Result Value Ref Range   Total Protein 5.2 (L) 6.5 - 8.1 g/dL   Albumin 2.2 (L) 3.5 - 5.0 g/dL   AST 164 (H) 15 - 41 U/L   ALT 187 (H) 0 - 44 U/L   Alkaline Phosphatase 61 38 - 126 U/L   Total Bilirubin 1.0 0.3 - 1.2 mg/dL   Bilirubin, Direct 0.2 0.0 - 0.2 mg/dL   Indirect Bilirubin 0.8 0.3 - 0.9 mg/dL    Comment: Performed at [facility] [address], [place], [place] [address]  Protime-INR     Status: Abnormal   Collection Time: 05/08/19  3:44 AM  Result Value Ref Range   Prothrombin Time 17.5 (H) 11.4 - 15.2 seconds   INR 1.5 (H) 0.8 - 1.2    Comment: (NOTE) INR goal varies based on device and disease states. Performed at [facility], [place] [address], [place], [place] [address]   Glucose, capillary     Status: None  Collection Time: 05/08/19  3:46 AM  Result Value Ref Range   Glucose-Capillary 97 70 - 99 mg/dL  Glucose, capillary     Status: Abnormal   Collection Time: 05/08/19 12:13 PM  Result Value Ref Range   Glucose-Capillary 180 (H) 70 - 99 mg/dL  Glucose, capillary     Status: Abnormal   Collection Time: 05/08/19  5:21 PM  Result Value Ref Range   Glucose-Capillary 245 (H) 70 - 99 mg/dL  Glucose, capillary     Status:  Abnormal   Collection Time: 05/08/19  7:42 PM  Result Value Ref Range   Glucose-Capillary 173 (H) 70 - 99 mg/dL  Glucose, capillary     Status: Abnormal   Collection Time: 05/08/19 11:44 PM  Result Value Ref Range   Glucose-Capillary 190 (H) 70 - 99 mg/dL  Glucose, capillary     Status: Abnormal   Collection Time: 05/09/19  4:18 AM  Result Value Ref Range   Glucose-Capillary 158 (H) 70 - 99 mg/dL  CBC     Status: Abnormal   Collection Time: 05/09/19  5:10 AM  Result Value Ref Range   WBC 8.3 4.0 - 10.5 K/uL   RBC 3.42 (L) 4.22 - 5.81 MIL/uL   Hemoglobin 10.0 (L) 13.0 - 17.0 g/dL   HCT 31.3 (L) 39.0 - 52.0 %   MCV 91.5 80.0 - 100.0 fL   MCH 29.2 26.0 - 34.0 pg   MCHC 31.9 30.0 - 36.0 g/dL   RDW 15.7 (H) 11.5 - 15.5 %   Platelets 230 150 - 400 K/uL   nRBC 0.0 0.0 - 0.2 %    Comment: Performed at [facility], [place] [address] [place], [place] [address]  Heparin level (unfractionated)     Status: None   Collection Time: 05/09/19  5:10 AM  Result Value Ref Range   Heparin Unfractionated 0.30 0.30 - 0.70 IU/mL    Comment: (NOTE) If heparin results are below expected values, and patient dosage has  been confirmed, suggest follow up testing of antithrombin III levels. Performed at [facility], [place] [address] [place], [place] [address]   Magnesium     Status: Abnormal   Collection Time: 05/09/19  5:10 AM  Result Value Ref Range   Magnesium 1.5 (L) 1.7 - 2.4 mg/dL    Comment: Performed at [facility] [address], [place], [place] [address]  Phosphorus     Status: None   Collection Time: 05/09/19  5:10 AM  Result Value Ref Range   Phosphorus 4.5 2.5 - 4.6 mg/dL    Comment: Performed at [facility] [address] [place], [place] [address]  Comprehensive metabolic panel     Status: Abnormal   Collection Time: 05/09/19  5:10 AM  Result Value Ref Range   Sodium 139 135 - 145 mmol/L   Potassium 3.5 3.5 - 5.1 mmol/L   Chloride 105 98 - 111 mmol/L    CO2 20 (L) 22 - 32 mmol/L   Glucose, Bld 168 (H) 70 - 99 mg/dL   BUN 48 (H) 8 - 23 mg/dL   Creatinine, Ser 5.17 (H) 0.61 - 1.24 mg/dL   Calcium 7.3 (L) 8.9 - 10.3 mg/dL   Total Protein 5.0 (L) 6.5 - 8.1 g/dL   Albumin 2.1 (L) 3.5 - 5.0 g/dL   AST 52 (H) 15 - 41 U/L   ALT 116 (H) 0 - 44 U/L   Alkaline Phosphatase 56 38 -  126 U/L   Total Bilirubin 0.9 0.3 - 1.2 mg/dL   GFR calc non Af Amer 11 (L) >60 mL/min   GFR calc Af Amer 13 (L) >60 mL/min   Anion gap 14 5 - 15    Comment: Performed at [facility] [address] [place], [place] [address]  Glucose, capillary     Status: Abnormal   Collection Time: 05/09/19  7:33 AM  Result Value Ref Range   Glucose-Capillary 154 (H) 70 - 99 mg/dL     ROS:  Pertinent items noted in HPI and remainder of comprehensive ROS otherwise negative.  Physical Exam: Vitals:   05/09/19 0800 05/09/19 0802  BP: 139/86 139/86  Pulse: 99 96  Resp: 20   Temp:    SpO2: 94%      Patient is in COVID isolation.  Physical exam in person was not done in an effort to preserve PPE and to avoid exposure to other providers.  Labs, vital signs and notes from hospitalist reviewed  Assessment/Plan: 68 year old white male with diabetes mellitus and ischemic cardiomyopathy-also seems to have baseline CKD.  He presents with a cardiac arrest and Covid diagnosis and has experienced worsening of his renal disease 1.Renal-it appears baseline CKD with creatinine of around 2.  Unclear if he is followed by [facility] as outpatient.  He has suffered acute on chronic renal failure during this hospitalization that was significant for a cardiac arrest.  I suspect his acute on chronic renal failure was hemodynamically mediated.  Urinalysis is fairly bland.  It is difficult for me to assess for uremic symptoms but hospitalist note yesterday said patient was eager to go home.  BUN of 48 is not in a place where I would expect uremic symptoms.  There are no acute indications for  dialysis.  Metabolic acidosis improved with bicarb-however given slightly worsening respiratory status and him being 8 L positive I would like to stop the IV bicarb.  Urine output was down and has picked up and rate of rise of creatinine is smaller.  Therefore, I am hoping for a plateau prior to an improvement 2. Hypertension/volume  -previous extreme hypotension requiring pressors.  Now blood pressure is soft but is requiring beta-blocker and calcium channel blocker for his A. Fib.  As above, given that he is 8 L positive I will discontinue continuous IV fluids 3.  Metabolic acidosis-due to CKD and acute on chronic renal failure.  Better with transient IV bicarb 4. Anemia  -has not gotten to a level that requires treatment-continue to follow   Louis Meckel 05/09/2019, 12:23 PM

## 2019-05-09 NOTE — Progress Notes (Signed)
ANTICOAGULATION CONSULT NOTE   Pharmacy Consult for Heparin  Indication: atrial fibrillation   No Known Allergies  Patient Measurements: Height: 5\' 11"  (180.3 cm) Weight: 226 lb 6.6 oz (102.7 kg) IBW/kg (Calculated) : 75.3 Heparin Dosing Weight:  96.2 kg   Vital Signs: Temp: 97.8 F (36.6 C) (10/10 0731) Temp Source: Oral (10/10 0731) BP: 126/85 (10/10 0731) Pulse Rate: 110 (10/10 0731)  Labs: Recent Labs    05/06/19 1205  05/06/19 2052 05/07/19 0342  05/07/19 1439 05/07/19 1801 05/08/19 0004 05/08/19 0344 05/09/19 0510  HGB 11.5*   < > 12.0* 12.3*   < >  --  9.9*  --  10.0* 10.0*  HCT 40.4   < > 37.4* 40.3   < >  --  29.0*  --  33.3* 31.3*  PLT 244  --  342 357  --   --   --   --  230 230  APTT 64*  --  118* 122*  --  67*  --   --  84*  --   LABPROT  --   --  18.0*  --   --   --   --   --  17.5*  --   INR  --   --  1.5*  --   --   --   --   --  1.5*  --   HEPARINUNFRC  --    < > 0.87* 0.97*  --   --   --   --  0.43 0.30  CREATININE 2.83*  --  3.63* 3.81*  --  4.62*  --  4.99*  --  5.17*  TROPONINIHS 243*  --   --   --   --   --   --   --   --   --    < > = values in this interval not displayed.    Estimated Creatinine Clearance: 17.4 mL/min (A) (by C-G formula based on SCr of 5.17 mg/dL (H)).  Assessment: 68 year old male with history of afib presenting with seizure-like activity transfer from [facility] - recent covid-19 in sept. LE Dopplers negative for DVT on 10/8. Pt was on apixaban PTA, uncertain when last dose was given. Pharmacy was consulted to dose heparin infusion. Heparin level and aPTT correlating 10/9 >now using heparin level for monitoring  Today, heparin level remains therapeutic at 0.3. SCr continues to rise, now to 5.17. Per RN, bleeding noted from CVC site this morning, but now appears resolved and CBC stable. No infusion issues per RN.    Goal of Therapy:  Heparin level 0.3-0.7 units/ml  Monitor platelets by anticoagulation protocol: Yes   Plan:   Continue heparin at 1600 units/hr Daily CBC and heparin level while on heparin Monitor s/sx bleeding   Brendolyn Patty, PharmD PGY2 Pharmacy Resident Phone [phone]  05/09/2019   7:56 AM

## 2019-05-09 NOTE — Progress Notes (Signed)
PROGRESS NOTE    Dylan Goodman  GNF:[id number] DOB: 01/04/1951 DOA: 05/06/2019 PCP: Steele Sizer, MD   Brief Narrative:  Patient is a 68 year old male with history of atrial fibrillation, GI bleed, coronary artery disease status post CABG, nasopharyngeal cancer, cardiomyopathy, chronic depression, CKD stage 4, diabetes mellitus, GI bleed , peripheral vascular disease, previous smoker, recent Covid-19 infection about 3 weeks ago before admission who presented to [facility] with seizure.  He sustained a cardiac arrest there in the emergency department after an seizure episode.  Due to Covid positivity he was sent to [facility] and was admitted under PCCM service.  He was defibrillated after cardiac arrest.  Intubated for airway protection.  Currently extubated and maintaining his saturation on nasal cannula.  Patient transferred to [facility] service on 05/08/19.  Hospital course remarkable for development of AKI on CKD,Afib with RVR.  Assessment & Plan:   Active Problems:   Unspecified atrial fibrillation (HCC)   Acute respiratory failure with hypoxemia (HCC)   Cardiac arrest (HCC)   Prolonged QT interval   Status post cardiac arrest: Initial rhythm was reported to be VT with conversion to A. fib RVR so he was defibrillated.  Downtime estimated  at 11 minutes.  Cardiology following.  Currently hemodynamically stable. Echocardiogram done on 05/08/2019 showed ejection fraction of 40- 45% showing improvement on comparison to his last echo.  A. fib with RVR: Went into Afib with RVR yesterday .Today rate is controlled. On Cardizem IV.  Cardiology following.  On Eliquis at home for anticoagulation which cannot be given because of acute kidney injury.  Currently on heparin drip  AKI on CKD stage 4: Creatinine creeping up.  He has poor urine output. Baseline creatinine around 2.5-3. AKI most likely secondary to ATN due to  hypotension secondary to cardiac arrest.  Also has metabolic acidosis.  Started on  bicarb and sterile water but now stopped. Will put a foley cath to measure the urine output.  Acute hypoxic respiratory failure: Intubated for airway protection.  Currently on oxygen at 2 to 3 L/min.  Not on oxygen at home.  Chest x-ray on 05/07/2019 showed stable bibasilar opacities.  COVID-19 infection: Diagnosed with COVID about 3 weeks ago.  Covid test on 05/04/2019 was positive..  No COVID specific treatment.  Continue supportive care.  On airborne precaution.  Hypotension: Required Levophed drip for maintenance of blood pressure.  Currently off drip.  Maintaining blood pressure.  Elevated liver enzymes: Most likely secondary to shock liver.  Anticipate improvement.  Seizure: Presented initially with seizure.  Had an seizure episode in the emergency department.  Started on Keppra.  Neurology was following.  Needs follow-up with neurology on discharge.  Debility/deconditioning: I will request for PT evaluation.  Patient moved to 2 W    Nutrition Problem: Increased nutrient needs Etiology: acute illness      DVT prophylaxis: Heparin IV Code Status: Full Family Communication: Called wife on phone on 05/09/19 Disposition Plan: Not stable for discharge.  Most likely discharge planning to home when stable after improvement in the kidney function.   Consultants: PCCM,cardiology  Procedures: Intubation/extubation  Antimicrobials:  Anti-infectives (From admission, onward)   Start     Dose/Rate Route Frequency Ordered Stop   05/09/19 1000  piperacillin-tazobactam (ZOSYN) IVPB 2.25 g     2.25 g 100 mL/hr over 30 Minutes Intravenous Every 8 hours 05/09/19 0833     05/08/19 1000  piperacillin-tazobactam (ZOSYN) IVPB 3.375 g  Status:  Discontinued     3.375  g 12.5 mL/hr over 240 Minutes Intravenous Every 12 hours 05/08/19 0930 05/09/19 0833   05/08/19 0630  piperacillin-tazobactam (ZOSYN) IVPB 3.375 g  Status:  Discontinued     3.375 g 12.5 mL/hr over 240 Minutes Intravenous Every 12  hours 05/07/19 2040 05/08/19 0930   05/07/19 2200  vancomycin (VANCOCIN) 1,750 mg in sodium chloride 0.9 % 500 mL IVPB  Status:  Discontinued     1,750 mg 250 mL/hr over 120 Minutes Intravenous Every 48 hours 05/06/19 1912 05/07/19 1039   05/07/19 2200  vancomycin (VANCOCIN) 1,500 mg in sodium chloride 0.9 % 500 mL IVPB  Status:  Discontinued     1,500 mg 250 mL/hr over 120 Minutes Intravenous Every 48 hours 05/07/19 1039 05/07/19 1228   05/07/19 0200  piperacillin-tazobactam (ZOSYN) IVPB 3.375 g  Status:  Discontinued     3.375 g 12.5 mL/hr over 240 Minutes Intravenous Every 8 hours 05/06/19 1853 05/07/19 2040   05/06/19 1930  piperacillin-tazobactam (ZOSYN) IVPB 3.375 g     3.375 g 100 mL/hr over 30 Minutes Intravenous  Once 05/06/19 1853 05/06/19 2156      Subjective:  Patient seen and examined at bedside this morning.  Currently heart rate is well controlled.  Blood pressure stable.  Afebrile.  Denies any complaints.  Alert and oriented. Objective: Vitals:   05/09/19 0422 05/09/19 0731 05/09/19 0800 05/09/19 0802  BP: 126/85 126/85 139/86 139/86  Pulse: 89 (!) 110 99 96  Resp: (!) 26 (!) 22 20   Temp: 97.6 F (36.4 C) 97.8 F (36.6 C)    TempSrc: Oral Oral    SpO2: 97% 98% 94%   Weight: 102.7 kg     Height:        Intake/Output Summary (Last 24 hours) at 05/09/2019 1354 Last data filed at 05/09/2019 0800 Gross per 24 hour  Intake 2899.69 ml  Output 1601 ml  Net 1298.69 ml   Filed Weights   05/07/19 0343 05/08/19 0347 05/09/19 0422  Weight: 91 kg 93.7 kg 102.7 kg    Examination:  General exam: Appears calm and comfortable ,Not in distress,average built, appears older than age.Very pleasant gentleman HEENT:PERRL,Oral mucosa moist, Ear/Nose normal on gross exam Respiratory system: Bilateral equal air entry, normal vesicular breath sounds, no wheezes or crackles heard with yellow steth Cardiovascular system: S1 & S2 heard, RRR. No JVD, murmurs, rubs, gallops or  clicks. No pedal edema. Gastrointestinal system: Abdomen is nondistended, soft and nontender. No organomegaly or masses felt. Normal bowel sounds heard. Central nervous system: Alert and oriented. No focal neurological deficits. Extremities: No edema, no clubbing ,no cyanosis, distal peripheral pulses palpable. Skin: No rashes, lesions or ulcers,no icterus ,no pallor      Data Reviewed: I have personally reviewed following labs and imaging studies  CBC: Recent Labs  Lab 05/06/19 1205  05/06/19 2052 05/07/19 0342 05/07/19 0400 05/07/19 1801 05/08/19 0344 05/09/19 0510  WBC 4.7  --  11.1* 9.5  --   --  8.8 8.3  NEUTROABS 1.5*  --   --   --   --   --   --   --   HGB 11.5*   < > 12.0* 12.3* 15.0 9.9* 10.0* 10.0*  HCT 40.4   < > 37.4* 40.3 44.0 29.0* 33.3* 31.3*  MCV 100.2*  --  93.0 96.6  --   --  96.8 91.5  PLT 244  --  342 357  --   --  230 230   < > =  values in this interval not displayed.   Basic Metabolic Panel: Recent Labs  Lab 05/06/19 2052 05/07/19 0342 05/07/19 0400 05/07/19 1439 05/07/19 1801 05/08/19 0004 05/08/19 0344 05/09/19 0510  NA 139 140 135 141 141 138  --  139  K 3.7 4.8 4.5 4.7 4.3 4.0  --  3.5  CL 110 109  --  112*  --  110  --  105  CO2 14* 13*  --  15*  --  13*  --  20*  GLUCOSE 146* 172*  --  141*  --  100*  --  168*  BUN 42* 42*  --  44*  --  46*  --  48*  CREATININE 3.63* 3.81*  --  4.62*  --  4.99*  --  5.17*  CALCIUM 6.2* 6.8*  --  6.5*  --  6.6*  --  7.3*  MG 1.1* 2.2  --  2.2  --   --  1.7 1.5*  PHOS 5.6* 5.8*  --  6.7*  --   --  5.2* 4.5   GFR: Estimated Creatinine Clearance: 17.4 mL/min (A) (by C-G formula based on SCr of 5.17 mg/dL (H)). Liver Function Tests: Recent Labs  Lab 05/05/19 1832 05/06/19 1205 05/06/19 2052 05/08/19 0344 05/09/19 0510  AST 44* 628* 1,197* 164* 52*  ALT 22 329* 376* 187* 116*  ALKPHOS 62 61 91 61 56  BILITOT 0.7 0.6 0.9 1.0 0.9  PROT 6.8 5.3* 5.5* 5.2* 5.0*  ALBUMIN 3.1* 2.4* 2.4* 2.2* 2.1*    No results for input(s): LIPASE, AMYLASE in the last 168 hours. No results for input(s): AMMONIA in the last 168 hours. Coagulation Profile: Recent Labs  Lab 05/05/19 2114 05/06/19 2052 05/08/19 0344  INR 1.4* 1.5* 1.5*   Cardiac Enzymes: No results for input(s): CKTOTAL, CKMB, CKMBINDEX, TROPONINI in the last 168 hours. BNP (last 3 results) No results for input(s): PROBNP in the last 8760 hours. HbA1C: No results for input(s): HGBA1C in the last 72 hours. CBG: Recent Labs  Lab 05/08/19 1942 05/08/19 2344 05/09/19 0418 05/09/19 0733 05/09/19 1237  GLUCAP 173* 190* 158* 154* 179*   Lipid Profile: Recent Labs    05/07/19 0342  TRIG 111   Thyroid Function Tests: No results for input(s): TSH, T4TOTAL, FREET4, T3FREE, THYROIDAB in the last 72 hours. Anemia Panel: No results for input(s): VITAMINB12, FOLATE, FERRITIN, TIBC, IRON, RETICCTPCT in the last 72 hours. Sepsis Labs: Recent Labs  Lab 05/05/19 2114 05/06/19 2052 05/07/19 0020  PROCALCITON 0.14 14.10  --   LATICACIDVEN 1.2 1.1 0.9    Recent Results (from the past 240 hour(s))  SARS Coronavirus 2 [facility] order, Performed in [facility] hospital lab) Nasopharyngeal Nasopharyngeal Swab     Status: Abnormal   Collection Time: 05/04/19 11:25 AM   Specimen: Nasopharyngeal Swab  Result Value Ref Range Status   SARS Coronavirus 2 POSITIVE (A) NEGATIVE Final    Comment: RESULT CALLED TO, READ BACK BY AND VERIFIED WITH: ASHLEY MAY AT 1248 ON 05/04/2019 [place]. (NOTE) If result is NEGATIVE SARS-CoV-2 target nucleic acids are NOT DETECTED. The SARS-CoV-2 RNA is generally detectable in upper and lower  respiratory specimens during the acute phase of infection. The lowest  concentration of SARS-CoV-2 viral copies this assay can detect is 250  copies / mL. A negative result does not preclude SARS-CoV-2 infection  and should not be used as the sole basis for treatment or other  patient management decisions.  A negative  result may occur  with  improper specimen collection / handling, submission of specimen other  than nasopharyngeal swab, presence of viral mutation(s) within the  areas targeted by this assay, and inadequate number of viral copies  (<250 copies / mL). A negative result must be combined with clinical  observations, patient history, and epidemiological information. If result is POSITIVE SARS-CoV-2 target nucleic acids are DETECTED.  The SARS-CoV-2 RNA is generally detectable in upper and lower  respiratory specimens during the acute phase of infection.  Positive  results are indicative of active infection with SARS-CoV-2.  Clinical  correlation with patient history and other diagnostic information is  necessary to determine patient infection status.  Positive results do  not rule out bacterial infection or co-infection with other viruses. If result is PRESUMPTIVE POSTIVE SARS-CoV-2 nucleic acids MAY BE PRESENT.   A presumptive positive result was obtained on the submitted specimen  and confirmed on repeat testing.  While 2019 novel coronavirus  (SARS-CoV-2) nucleic acids may be present in the submitted sample  additional confirmatory testing may be necessary for epidemiological  and / or clinical management purposes  to differentiate between  SARS-CoV-2 and other Sarbecovirus currently known to infect humans.  If clinically indicated additional testing with an alternate test  methodology [phone]) i s advised. The SARS-CoV-2 RNA is generally  detectable in upper and lower respiratory specimens during the acute  phase of infection. The expected result is Negative. Fact Sheet for Patients:  StrictlyIdeas.no Fact Sheet for Healthcare Providers: BankingDealers.co.za This test is not yet approved or cleared by the [place] FDA and has been authorized for detection and/or diagnosis of SARS-CoV-2 by FDA under an Emergency Use Authorization  (EUA).  This EUA will remain in effect (meaning this test can be used) for the duration of the COVID-19 declaration under Section 564(b)(1) of the Act, 21 U.S.C. section 360bbb-3(b)(1), unless the authorization is terminated or revoked sooner. Performed at [facility], [address] [place], [place] [address]   Blood Culture (routine x 2)     Status: None (Preliminary result)   Collection Time: 05/05/19  9:14 PM   Specimen: BLOOD  Result Value Ref Range Status   Specimen Description BLOOD LEFT ANTECUBITAL  Final   Special Requests   Final    BOTTLES DRAWN AEROBIC AND ANAEROBIC Blood Culture results may not be optimal due to an excessive volume of blood received in culture bottles   Culture   Final    NO GROWTH 4 DAYS Performed at [facility], [address], [place], [place] [address]    Report Status PENDING  Incomplete  Blood Culture (routine x 2)     Status: None (Preliminary result)   Collection Time: 05/05/19  9:40 PM   Specimen: BLOOD  Result Value Ref Range Status   Specimen Description BLOOD BLOOD RIGHT WRIST  Final   Special Requests   Final    BOTTLES DRAWN AEROBIC AND ANAEROBIC Blood Culture adequate volume   Culture   Final    NO GROWTH 4 DAYS Performed at [facility], [address] [place], [place] [address]    Report Status PENDING  Incomplete  MRSA PCR Screening     Status: None   Collection Time: 05/06/19  5:31 PM   Specimen: Nasal Mucosa; Nasopharyngeal  Result Value Ref Range Status   MRSA by PCR NEGATIVE NEGATIVE Final    Comment:        The GeneXpert MRSA Assay (FDA approved for NASAL specimens only), is  one component of a comprehensive MRSA colonization surveillance program. It is not intended to diagnose MRSA infection nor to guide or monitor treatment for MRSA infections. Performed at [facility], [place] [address] [place], [place] [address]   Culture, blood (routine x 2)     Status: None (Preliminary  result)   Collection Time: 05/06/19  8:10 PM   Specimen: BLOOD RIGHT HAND  Result Value Ref Range Status   Specimen Description BLOOD RIGHT HAND  Final   Special Requests   Final    BOTTLES DRAWN AEROBIC AND ANAEROBIC Blood Culture results may not be optimal due to an inadequate volume of blood received in culture bottles   Culture   Final    NO GROWTH 2 DAYS Performed at [facility], [place] [address], [place], [place] [address]    Report Status PENDING  Incomplete  Culture, blood (routine x 2)     Status: None (Preliminary result)   Collection Time: 05/06/19  8:33 PM   Specimen: BLOOD LEFT HAND  Result Value Ref Range Status   Specimen Description BLOOD LEFT HAND  Final   Special Requests   Final    AEROBIC BOTTLE ONLY Blood Culture results may not be optimal due to an inadequate volume of blood received in culture bottles   Culture   Final    NO GROWTH 2 DAYS Performed at [facility], [place] [address], [place], [place] [address]    Report Status PENDING  Incomplete  Urine culture     Status: None   Collection Time: 05/06/19  9:17 PM   Specimen: Urine, Random  Result Value Ref Range Status   Specimen Description URINE, RANDOM  Final   Special Requests NONE  Final   Culture   Final    NO GROWTH Performed at [facility], [place] [address], [place], [place] [address]    Report Status 05/07/2019 FINAL  Final  Culture, respiratory (tracheal aspirate)     Status: None   Collection Time: 05/07/19 10:24 AM   Specimen: Tracheal Aspirate; Respiratory  Result Value Ref Range Status   Specimen Description TRACHEAL ASPIRATE  Final   Special Requests NONE  Final   Gram Stain NO WBC SEEN RARE BUDDING YEAST SEEN   Final   Culture   Final    Consistent with normal respiratory flora. Performed at [facility], [place] [address] [place], [place] [address]    Report Status 05/09/2019 FINAL  Final         Radiology Studies: No results found.      Scheduled  Meds: . budesonide  1 puff Inhalation BID  . calcitRIOL  0.25 mcg Oral Daily  . Chlorhexidine Gluconate Cloth  6 each Topical Daily  . feeding supplement (GLUCERNA SHAKE)  237 mL Oral BID BM  . hydrocortisone sodium succinate  50 mg Intravenous Q12H  . Ipratropium-Albuterol  1 puff Inhalation Q6H  . levETIRAcetam  500 mg Oral BID  . metoprolol tartrate  25 mg Oral BID  . pantoprazole  40 mg Oral Daily  . sodium chloride flush  3 mL Intravenous Q12H   Continuous Infusions: . sodium chloride    . diltiazem (CARDIZEM) infusion 15 mg/hr (05/09/19 0956)  . heparin 1,600 Units/hr (05/09/19 0021)  . piperacillin-tazobactam (ZOSYN)  IV 2.25 g (05/09/19 1114)     LOS: 3 days    Time spent: 35 mins.More than 50% of that time was spent in counseling and/or  coordination of care.      Shelly Coss, MD [facility] Pager [phone]  If 7PM-7AM, please contact night-coverage [email or web address] Password [email or web address] 05/09/2019, 1:54 PM

## 2019-05-10 LAB — GLUCOSE, CAPILLARY
Glucose-Capillary: 194 mg/dL — ABNORMAL HIGH (ref 70–99)
Glucose-Capillary: 163 mg/dL — ABNORMAL HIGH (ref 70–99)
Glucose-Capillary: 166 mg/dL — ABNORMAL HIGH (ref 70–99)
Glucose-Capillary: 223 mg/dL — ABNORMAL HIGH (ref 70–99)

## 2019-05-10 LAB — CULTURE, BLOOD (ROUTINE X 2)
Culture: NO GROWTH
Culture: NO GROWTH
Special Requests: ADEQUATE

## 2019-05-10 LAB — CBC
HCT: 34 % — ABNORMAL LOW (ref 39.0–52.0)
Hemoglobin: 11 g/dL — ABNORMAL LOW (ref 13.0–17.0)
MCH: 29.5 pg (ref 26.0–34.0)
MCHC: 32.4 g/dL (ref 30.0–36.0)
MCV: 91.2 fL (ref 80.0–100.0)
Platelets: 227 10*3/uL (ref 150–400)
RBC: 3.73 MIL/uL — ABNORMAL LOW (ref 4.22–5.81)
RDW: 16 % — ABNORMAL HIGH (ref 11.5–15.5)
WBC: 9.2 10*3/uL (ref 4.0–10.5)
nRBC: 0.3 % — ABNORMAL HIGH (ref 0.0–0.2)

## 2019-05-10 LAB — RENAL FUNCTION PANEL
Albumin: 2.2 g/dL — ABNORMAL LOW (ref 3.5–5.0)
Anion gap: 16 — ABNORMAL HIGH (ref 5–15)
BUN: 48 mg/dL — ABNORMAL HIGH (ref 8–23)
CO2: 21 mmol/L — ABNORMAL LOW (ref 22–32)
Calcium: 8 mg/dL — ABNORMAL LOW (ref 8.9–10.3)
Chloride: 103 mmol/L (ref 98–111)
Creatinine, Ser: 4.66 mg/dL — ABNORMAL HIGH (ref 0.61–1.24)
GFR calc Af Amer: 14 mL/min — ABNORMAL LOW (ref >60)
GFR calc non Af Amer: 12 mL/min — ABNORMAL LOW (ref >60)
Glucose, Bld: 161 mg/dL — ABNORMAL HIGH (ref 70–99)
Phosphorus: 4.9 mg/dL — ABNORMAL HIGH (ref 2.5–4.6)
Potassium: 3.6 mmol/L (ref 3.5–5.1)
Sodium: 140 mmol/L (ref 135–145)

## 2019-05-10 LAB — MAGNESIUM: Magnesium: 1.5 mg/dL — ABNORMAL LOW (ref 1.7–2.4)

## 2019-05-10 LAB — HEPARIN LEVEL (UNFRACTIONATED): Heparin Unfractionated: 0.58 IU/mL (ref 0.30–0.70)

## 2019-05-10 MED ORDER — HYDROCORTISONE NA SUCCINATE PF 100 MG IJ SOLR
50.0000 mg | Freq: Every day | INTRAMUSCULAR | Status: AC
  Administered 2019-05-11: 50 mg via INTRAVENOUS
  Filled 2019-05-10: qty 2

## 2019-05-10 MED ORDER — MAGNESIUM SULFATE 2 GM/50ML IV SOLN
2.0000 g | Freq: Once | INTRAVENOUS | Status: AC
  Administered 2019-05-10: 2 g via INTRAVENOUS
  Filled 2019-05-10: qty 50

## 2019-05-10 NOTE — Progress Notes (Signed)
ANTICOAGULATION CONSULT NOTE   Pharmacy Consult for Heparin  Indication: atrial fibrillation   No Known Allergies  Patient Measurements: Height: 5\' 11"  (180.3 cm) Weight: 231 lb 0.7 oz (104.8 kg) IBW/kg (Calculated) : 75.3 Heparin Dosing Weight:  96.2 kg   Vital Signs: Temp: 97.6 F (36.4 C) (10/11 0732) Temp Source: Oral (10/11 0732) BP: 131/80 (10/11 0732) Pulse Rate: 88 (10/11 0732)  Labs: Recent Labs    05/07/19 1439  05/08/19 0004 05/08/19 0344 05/09/19 0510 05/10/19 0651  HGB  --    < >  --  10.0* 10.0* 11.0*  HCT  --    < >  --  33.3* 31.3* 34.0*  PLT  --   --   --  230 230 227  APTT 67*  --   --  84*  --   --   LABPROT  --   --   --  17.5*  --   --   INR  --   --   --  1.5*  --   --   HEPARINUNFRC  --   --   --  0.43 0.30 0.58  CREATININE 4.62*  --  4.99*  --  5.17*  --    < > = values in this interval not displayed.    Estimated Creatinine Clearance: 17.5 mL/min (A) (by C-G formula based on SCr of 5.17 mg/dL (H)).  Assessment: 68 year old male with history of afib presenting with cardiac arrest and seizure-like activity transfer from [facility] - recent covid-19 in sept. LE Dopplers negative for DVT on 10/8. Pt was on apixaban PTA, uncertain when last dose was given. Pharmacy was consulted to dose heparin infusion. Heparin level and aPTT correlating 10/9 >now using heparin level for monitoring  Today, heparin level remains therapeutic at 0.58 and CBC stable. Other daily labs still in process. No bleeding or infusion issues per RN.    Goal of Therapy:  Heparin level 0.3-0.7 units/ml  Monitor platelets by anticoagulation protocol: Yes   Plan:  Continue heparin at 1600 units/hr Daily CBC and heparin level while on heparin Monitor s/sx bleeding    Brendolyn Patty, PharmD PGY2 Pharmacy Resident Phone [phone]  05/10/2019   8:24 AM

## 2019-05-10 NOTE — Progress Notes (Signed)
PROGRESS NOTE    Dylan Goodman  IWP:[id number] DOB: 11-13-1950 DOA: 05/06/2019 PCP: Steele Sizer, MD   Brief Narrative:  Patient is a 68 year old male with history of atrial fibrillation, GI bleed, coronary artery disease status post CABG, nasopharyngeal cancer, cardiomyopathy, chronic depression, CKD stage 4, diabetes mellitus, GI bleed , peripheral vascular disease, previous smoker, recent Covid-19 infection about 3 weeks ago before admission who presented to [facility] with seizure.  He sustained a cardiac arrest there in the emergency department after an seizure episode.  Due to Covid positivity he was sent to [facility] and was admitted under PCCM service.  He was defibrillated after cardiac arrest.  Intubated for airway protection.  Currently extubated and maintaining his saturation on nasal cannula.  Patient transferred to [facility] service on 05/08/19.  Hospital course remarkable for development of AKI on CKD,Afib with RVR.  Assessment & Plan:   Active Problems:   Unspecified atrial fibrillation (HCC)   Acute respiratory failure with hypoxemia (HCC)   Cardiac arrest (HCC)   Prolonged QT interval   Status post cardiac arrest: Initial rhythm was reported to be VT with conversion to A. fib RVR so he was defibrillated.  Downtime estimated  at 11 minutes.  Cardiology following.  Currently hemodynamically stable. Echocardiogram done on 05/08/2019 showed ejection fraction of 40- 45% showing improvement on comparison to his last echo.  A. fib with RVR: Hospital course remarkable for Afib with RVR  .Today rate remains controlled. On Cardizem IV.  Cardiology following.  On Eliquis at home for anticoagulation which cannot be given because of acute kidney injury.  Currently on heparin drip.Will folllow up with cardiology for recommendation on anticoagulation.  AKI on CKD stage 4: Creatinine trended down today and is in range of 4 . Baseline creatinine around 2.5-3. AKI most likely secondary to ATN  due to  hypotension secondary to cardiac arrest.  Also has metabolic acidosis.  Started on bicarb and sterile water but now stopped. Patient voiding well.  Acute hypoxic respiratory failure: Intubated for airway protection.  Currently on oxygen at 2 to 3 L/min.  Not on oxygen at home.  Chest x-ray on 05/07/2019 showed stable bibasilar opacities.Continue to try weaning oxygen.  COVID-19 infection: Diagnosed with COVID about 3 weeks ago.  Covid test on 05/04/2019 was positive..  No COVID specific treatment.  Continue supportive care.  On airborne precaution.  Hypotension: Required Levophed drip for maintenance of blood pressure.  Currently off drip.  Maintaining blood pressure.  Elevated liver enzymes: Most likely secondary to shock liver.  Anticipate improvement.  Seizure: Presented initially with seizure.  Had an seizure episode in the emergency department.  Started on Keppra.  Neurology was following.  Needs follow-up with neurology on discharge.  Debility/deconditioning: Patient evaluated by physical therapy and recommended home health on discharge.   Nutrition Problem: Increased nutrient needs Etiology: acute illness      DVT prophylaxis: Heparin IV Code Status: Full Family Communication: Called wife on phone on 05/09/19 Disposition Plan:   Most likely discharge planning to home when stable after improvement in the kidney function,cardiology/nephrology clearance   Consultants: PCCM,cardiology  Procedures: Intubation/extubation  Antimicrobials:  Anti-infectives (From admission, onward)   Start     Dose/Rate Route Frequency Ordered Stop   05/09/19 1000  piperacillin-tazobactam (ZOSYN) IVPB 2.25 g     2.25 g 100 mL/hr over 30 Minutes Intravenous Every 8 hours 05/09/19 0833     05/08/19 1000  piperacillin-tazobactam (ZOSYN) IVPB 3.375 g  Status:  Discontinued  3.375 g 12.5 mL/hr over 240 Minutes Intravenous Every 12 hours 05/08/19 0930 05/09/19 0833   05/08/19 0630   piperacillin-tazobactam (ZOSYN) IVPB 3.375 g  Status:  Discontinued     3.375 g 12.5 mL/hr over 240 Minutes Intravenous Every 12 hours 05/07/19 2040 05/08/19 0930   05/07/19 2200  vancomycin (VANCOCIN) 1,750 mg in sodium chloride 0.9 % 500 mL IVPB  Status:  Discontinued     1,750 mg 250 mL/hr over 120 Minutes Intravenous Every 48 hours 05/06/19 1912 05/07/19 1039   05/07/19 2200  vancomycin (VANCOCIN) 1,500 mg in sodium chloride 0.9 % 500 mL IVPB  Status:  Discontinued     1,500 mg 250 mL/hr over 120 Minutes Intravenous Every 48 hours 05/07/19 1039 05/07/19 1228   05/07/19 0200  piperacillin-tazobactam (ZOSYN) IVPB 3.375 g  Status:  Discontinued     3.375 g 12.5 mL/hr over 240 Minutes Intravenous Every 8 hours 05/06/19 1853 05/07/19 2040   05/06/19 1930  piperacillin-tazobactam (ZOSYN) IVPB 3.375 g     3.375 g 100 mL/hr over 30 Minutes Intravenous  Once 05/06/19 1853 05/06/19 2156      Subjective:  Patient seen and examined the bedside this morning.  He is hemodynamically stable.  Comfortable.  Heart rate is well controlled.  Denies any shortness of breath or chest pain.  Voiding well.  Little bit unhappy because he is not able to go home. . Objective: Vitals:   05/10/19 0355 05/10/19 0732 05/10/19 1200 05/10/19 1244  BP: 123/70 131/80 108/64   Pulse: 81 88 71   Resp: (!) 21 (!) 22 19 19   Temp: 97.6 F (36.4 C) 97.6 F (36.4 C)    TempSrc: Oral Oral    SpO2: 100% 95% 96% 94%  Weight:      Height:        Intake/Output Summary (Last 24 hours) at 05/10/2019 1341 Last data filed at 05/10/2019 0900 Gross per 24 hour  Intake 406 ml  Output 900 ml  Net -494 ml   Filed Weights   05/08/19 0347 05/09/19 0422 05/09/19 2324  Weight: 93.7 kg 102.7 kg 104.8 kg    Examination:  General exam: Appears calm and comfortable ,Not in distress,average built, appears older than age.Very pleasant gentleman HEENT:PERRL,Oral mucosa moist, Ear/Nose normal on gross exam Respiratory system:  Bilateral equal air entry, normal vesicular breath sounds, no wheezes or crackles heard with yellow steth Cardiovascular system: Afib , No JVD, murmurs, rubs, gallops or clicks. No pedal edema. Gastrointestinal system: Abdomen is nondistended, soft and nontender. No organomegaly or masses felt. Normal bowel sounds heard. Central nervous system: Alert and oriented. No focal neurological deficits. Extremities: No edema, no clubbing ,no cyanosis, distal peripheral pulses palpable. Skin: No rashes, lesions or ulcers,no icterus ,no pallor      Data Reviewed: I have personally reviewed following labs and imaging studies  CBC: Recent Labs  Lab 05/06/19 1205  05/06/19 2052 05/07/19 0342 05/07/19 0400 05/07/19 1801 05/08/19 0344 05/09/19 0510 05/10/19 0651  WBC 4.7  --  11.1* 9.5  --   --  8.8 8.3 9.2  NEUTROABS 1.5*  --   --   --   --   --   --   --   --   HGB 11.5*   < > 12.0* 12.3* 15.0 9.9* 10.0* 10.0* 11.0*  HCT 40.4   < > 37.4* 40.3 44.0 29.0* 33.3* 31.3* 34.0*  MCV 100.2*  --  93.0 96.6  --   --  96.8 91.5  91.2  PLT 244  --  342 357  --   --  230 230 227   < > = values in this interval not displayed.   Basic Metabolic Panel: Recent Labs  Lab 05/07/19 0342  05/07/19 1439 05/07/19 1801 05/08/19 0004 05/08/19 0344 05/09/19 0510 05/10/19 0651  NA 140   < > 141 141 138  --  139 140  K 4.8   < > 4.7 4.3 4.0  --  3.5 3.6  CL 109  --  112*  --  110  --  105 103  CO2 13*  --  15*  --  13*  --  20* 21*  GLUCOSE 172*  --  141*  --  100*  --  168* 161*  BUN 42*  --  44*  --  46*  --  48* 48*  CREATININE 3.81*  --  4.62*  --  4.99*  --  5.17* 4.66*  CALCIUM 6.8*  --  6.5*  --  6.6*  --  7.3* 8.0*  MG 2.2  --  2.2  --   --  1.7 1.5* 1.5*  PHOS 5.8*  --  6.7*  --   --  5.2* 4.5 4.9*   < > = values in this interval not displayed.   GFR: Estimated Creatinine Clearance: 19.5 mL/min (A) (by C-G formula based on SCr of 4.66 mg/dL (H)). Liver Function Tests: Recent Labs  Lab  05/05/19 1832 05/06/19 1205 05/06/19 2052 05/08/19 0344 05/09/19 0510 05/10/19 0651  AST 44* 628* 1,197* 164* 52*  --   ALT 22 329* 376* 187* 116*  --   ALKPHOS 62 61 91 61 56  --   BILITOT 0.7 0.6 0.9 1.0 0.9  --   PROT 6.8 5.3* 5.5* 5.2* 5.0*  --   ALBUMIN 3.1* 2.4* 2.4* 2.2* 2.1* 2.2*   No results for input(s): LIPASE, AMYLASE in the last 168 hours. No results for input(s): AMMONIA in the last 168 hours. Coagulation Profile: Recent Labs  Lab 05/05/19 2114 05/06/19 2052 05/08/19 0344  INR 1.4* 1.5* 1.5*   Cardiac Enzymes: No results for input(s): CKTOTAL, CKMB, CKMBINDEX, TROPONINI in the last 168 hours. BNP (last 3 results) No results for input(s): PROBNP in the last 8760 hours. HbA1C: No results for input(s): HGBA1C in the last 72 hours. CBG: Recent Labs  Lab 05/09/19 1729 05/09/19 1936 05/09/19 2326 05/10/19 0353 05/10/19 0730  GLUCAP 166* 149* 207* 194* 163*   Lipid Profile: No results for input(s): CHOL, HDL, LDLCALC, TRIG, CHOLHDL, LDLDIRECT in the last 72 hours. Thyroid Function Tests: No results for input(s): TSH, T4TOTAL, FREET4, T3FREE, THYROIDAB in the last 72 hours. Anemia Panel: No results for input(s): VITAMINB12, FOLATE, FERRITIN, TIBC, IRON, RETICCTPCT in the last 72 hours. Sepsis Labs: Recent Labs  Lab 05/05/19 2114 05/06/19 2052 05/07/19 0020  PROCALCITON 0.14 14.10  --   LATICACIDVEN 1.2 1.1 0.9    Recent Results (from the past 240 hour(s))  SARS Coronavirus 2 [facility] order, Performed in [facility] hospital lab) Nasopharyngeal Nasopharyngeal Swab     Status: Abnormal   Collection Time: 05/04/19 11:25 AM   Specimen: Nasopharyngeal Swab  Result Value Ref Range Status   SARS Coronavirus 2 POSITIVE (A) NEGATIVE Final    Comment: RESULT CALLED TO, READ BACK BY AND VERIFIED WITH: ASHLEY MAY AT 1248 ON 05/04/2019 [place]. (NOTE) If result is NEGATIVE SARS-CoV-2 target nucleic acids are NOT DETECTED. The SARS-CoV-2 RNA is generally  detectable in upper and  lower  respiratory specimens during the acute phase of infection. The lowest  concentration of SARS-CoV-2 viral copies this assay can detect is 250  copies / mL. A negative result does not preclude SARS-CoV-2 infection  and should not be used as the sole basis for treatment or other  patient management decisions.  A negative result may occur with  improper specimen collection / handling, submission of specimen other  than nasopharyngeal swab, presence of viral mutation(s) within the  areas targeted by this assay, and inadequate number of viral copies  (<250 copies / mL). A negative result must be combined with clinical  observations, patient history, and epidemiological information. If result is POSITIVE SARS-CoV-2 target nucleic acids are DETECTED.  The SARS-CoV-2 RNA is generally detectable in upper and lower  respiratory specimens during the acute phase of infection.  Positive  results are indicative of active infection with SARS-CoV-2.  Clinical  correlation with patient history and other diagnostic information is  necessary to determine patient infection status.  Positive results do  not rule out bacterial infection or co-infection with other viruses. If result is PRESUMPTIVE POSTIVE SARS-CoV-2 nucleic acids MAY BE PRESENT.   A presumptive positive result was obtained on the submitted specimen  and confirmed on repeat testing.  While 2019 novel coronavirus  (SARS-CoV-2) nucleic acids may be present in the submitted sample  additional confirmatory testing may be necessary for epidemiological  and / or clinical management purposes  to differentiate between  SARS-CoV-2 and other Sarbecovirus currently known to infect humans.  If clinically indicated additional testing with an alternate test  methodology [phone]) i s advised. The SARS-CoV-2 RNA is generally  detectable in upper and lower respiratory specimens during the acute  phase of infection. The  expected result is Negative. Fact Sheet for Patients:  StrictlyIdeas.no Fact Sheet for Healthcare Providers: BankingDealers.co.za This test is not yet approved or cleared by the [place] FDA and has been authorized for detection and/or diagnosis of SARS-CoV-2 by FDA under an Emergency Use Authorization (EUA).  This EUA will remain in effect (meaning this test can be used) for the duration of the COVID-19 declaration under Section 564(b)(1) of the Act, 21 U.S.C. section 360bbb-3(b)(1), unless the authorization is terminated or revoked sooner. Performed at [facility], [address] [place], [place] [address]   Blood Culture (routine x 2)     Status: None   Collection Time: 05/05/19  9:14 PM   Specimen: BLOOD  Result Value Ref Range Status   Specimen Description BLOOD LEFT ANTECUBITAL  Final   Special Requests   Final    BOTTLES DRAWN AEROBIC AND ANAEROBIC Blood Culture results may not be optimal due to an excessive volume of blood received in culture bottles   Culture   Final    NO GROWTH 5 DAYS Performed at [facility], [address], [place], [place] [address]    Report Status 05/10/2019 FINAL  Final  Blood Culture (routine x 2)     Status: None   Collection Time: 05/05/19  9:40 PM   Specimen: BLOOD  Result Value Ref Range Status   Specimen Description BLOOD BLOOD RIGHT WRIST  Final   Special Requests   Final    BOTTLES DRAWN AEROBIC AND ANAEROBIC Blood Culture adequate volume   Culture   Final    NO GROWTH 5 DAYS Performed at [facility], [address], [place], [place] [address]    Report Status 05/10/2019 FINAL  Final  MRSA  PCR Screening     Status: None   Collection Time: 05/06/19  5:31 PM   Specimen: Nasal Mucosa; Nasopharyngeal  Result Value Ref Range Status   MRSA by PCR NEGATIVE NEGATIVE Final    Comment:        The GeneXpert MRSA Assay (FDA approved for NASAL specimens  only), is one component of a comprehensive MRSA colonization surveillance program. It is not intended to diagnose MRSA infection nor to guide or monitor treatment for MRSA infections. Performed at [facility], [place] [address], [place], [place] [address]   Culture, blood (routine x 2)     Status: None (Preliminary result)   Collection Time: 05/06/19  8:10 PM   Specimen: BLOOD RIGHT HAND  Result Value Ref Range Status   Specimen Description BLOOD RIGHT HAND  Final   Special Requests   Final    BOTTLES DRAWN AEROBIC AND ANAEROBIC Blood Culture results may not be optimal due to an inadequate volume of blood received in culture bottles   Culture   Final    NO GROWTH 3 DAYS Performed at [facility], [place] [address] [place], [place] [address]    Report Status PENDING  Incomplete  Culture, blood (routine x 2)     Status: None (Preliminary result)   Collection Time: 05/06/19  8:33 PM   Specimen: BLOOD LEFT HAND  Result Value Ref Range Status   Specimen Description BLOOD LEFT HAND  Final   Special Requests   Final    AEROBIC BOTTLE ONLY Blood Culture results may not be optimal due to an inadequate volume of blood received in culture bottles   Culture   Final    NO GROWTH 3 DAYS Performed at [facility], [place] [address], [place], [place] [address]    Report Status PENDING  Incomplete  Urine culture     Status: None   Collection Time: 05/06/19  9:17 PM   Specimen: Urine, Random  Result Value Ref Range Status   Specimen Description URINE, RANDOM  Final   Special Requests NONE  Final   Culture   Final    NO GROWTH Performed at [facility], [place] [address], [place], [place] [address]    Report Status 05/07/2019 FINAL  Final  Culture, respiratory (tracheal aspirate)     Status: None   Collection Time: 05/07/19 10:24 AM   Specimen: Tracheal Aspirate; Respiratory  Result Value Ref Range Status   Specimen Description TRACHEAL ASPIRATE  Final   Special Requests  NONE  Final   Gram Stain NO WBC SEEN RARE BUDDING YEAST SEEN   Final   Culture   Final    Consistent with normal respiratory flora. Performed at [facility], [place] [address] [place], [place] [address]    Report Status 05/09/2019 FINAL  Final         Radiology Studies: No results found.      Scheduled Meds: . budesonide  1 puff Inhalation BID  . calcitRIOL  0.25 mcg Oral Daily  . Chlorhexidine Gluconate Cloth  6 each Topical Daily  . feeding supplement (GLUCERNA SHAKE)  237 mL Oral BID BM  . hydrocortisone sodium succinate  50 mg Intravenous Q12H  . Ipratropium-Albuterol  1 puff Inhalation Q6H  . levETIRAcetam  500 mg Oral BID  . metoprolol tartrate  25 mg Oral BID  . pantoprazole  40 mg Oral Daily  . sodium chloride flush  3 mL Intravenous Q12H  Continuous Infusions: . sodium chloride    . diltiazem (CARDIZEM) infusion 15 mg/hr (05/10/19 0620)  . heparin 1,600 Units/hr (05/10/19 1232)  . magnesium sulfate bolus IVPB    . piperacillin-tazobactam (ZOSYN)  IV 2.25 g (05/10/19 1241)     LOS: 4 days    Time spent: 35 mins.More than 50% of that time was spent in counseling and/or coordination of care.      Shelly Coss, MD [facility] Pager [phone]  If 7PM-7AM, please contact night-coverage [email or web address] Password [email or web address] 05/10/2019, 1:41 PM

## 2019-05-10 NOTE — Progress Notes (Signed)
Subjective:  At least 450 of UOP, crt down !! Objective Vital signs in last 24 hours: Vitals:   05/09/19 1941 05/09/19 2324 05/10/19 0355 05/10/19 0732  BP: 134/76  123/70 131/80  Pulse: (!) 114  81 88  Resp: (!) 28  (!) 21 (!) 22  Temp: 97.6 F (36.4 C) 97.8 F (36.6 C) 97.6 F (36.4 C) 97.6 F (36.4 C)  TempSrc: Oral Oral Oral Oral  SpO2: 100%  100% 95%  Weight:  104.8 kg    Height:       Weight change: 2.1 kg  Intake/Output Summary (Last 24 hours) at 05/10/2019 0902 Last data filed at 05/10/2019 0804 Gross per 24 hour  Intake 406 ml  Output 600 ml  Net -194 ml    Assessment/Plan: 68 year old white male with diabetes mellitus and ischemic cardiomyopathy-also  baseline CKD.  He presents with a cardiac arrest and Covid diagnosis and has experienced worsening of his renal disease 1.Renal- A on CRF.   baseline CKD with creatinine of around 2.  Unclear if he is followed by [facility] as outpatient.  He has suffered acute on chronic renal failure during this hospitalization that was significant for a cardiac arrest and prolonged hypotension, suspect was hemodynamically mediated.  Urinalysis is fairly bland.  It is difficult for me to assess for uremic symptoms but hospitalist note yesterday said patient was eager to go home.  BUN of 48 is not in a place where I would expect uremic symptoms.  There are no acute indications for dialysis.  Metabolic acidosis improved with bicarb- IV bicarb stopped yesterday.  Urine output was down but has picked up and creatine finally improved today.  No new suggestions, cont supportive care 2. Hypertension/volume  -previous extreme hypotension requiring pressors.  Now blood pressure is OK-  is requiring beta-blocker and calcium channel blocker for his A. Fib.  Given that he is 8 L positive I discontinued  IV fluids yesterday 3.  Metabolic acidosis-due to CKD and acute on chronic renal failure.  Better with transient IV bicarb 4. Anemia  -has not gotten to a level  that requires treatment-continue to follow     [place]: Basic Metabolic Panel: Recent Labs  Lab 05/08/19 0004 05/08/19 0344 05/09/19 0510 05/10/19 0651  NA 138  --  139 140  K 4.0  --  3.5 3.6  CL 110  --  105 103  CO2 13*  --  20* 21*  GLUCOSE 100*  --  168* 161*  BUN 46*  --  48* 48*  CREATININE 4.99*  --  5.17* 4.66*  CALCIUM 6.6*  --  7.3* 8.0*  PHOS  --  5.2* 4.5 4.9*   Liver Function Tests: Recent Labs  Lab 05/06/19 2052 05/08/19 0344 05/09/19 0510 05/10/19 0651  AST 1,197* 164* 52*  --   ALT 376* 187* 116*  --   ALKPHOS 91 61 56  --   BILITOT 0.9 1.0 0.9  --   PROT 5.5* 5.2* 5.0*  --   ALBUMIN 2.4* 2.2* 2.1* 2.2*   No results for input(s): LIPASE, AMYLASE in the last 168 hours. No results for input(s): AMMONIA in the last 168 hours. CBC: Recent Labs  Lab 05/06/19 1205  05/06/19 2052 05/07/19 0342  05/08/19 0344 05/09/19 0510 05/10/19 0651  WBC 4.7  --  11.1* 9.5  --  8.8 8.3 9.2  NEUTROABS 1.5*  --   --   --   --   --   --   --  HGB 11.5*   < > 12.0* 12.3*   < > 10.0* 10.0* 11.0*  HCT 40.4   < > 37.4* 40.3   < > 33.3* 31.3* 34.0*  MCV 100.2*  --  93.0 96.6  --  96.8 91.5 91.2  PLT 244  --  342 357  --  230 230 227   < > = values in this interval not displayed.   Cardiac Enzymes: No results for input(s): CKTOTAL, CKMB, CKMBINDEX, TROPONINI in the last 168 hours. CBG: Recent Labs  Lab 05/09/19 1729 05/09/19 1936 05/09/19 2326 05/10/19 0353 05/10/19 0730  GLUCAP 166* 149* 207* 194* 163*    Iron Studies: No results for input(s): IRON, TIBC, TRANSFERRIN, FERRITIN in the last 72 hours. Studies/Results: No results found. Medications: Infusions: . sodium chloride    . diltiazem (CARDIZEM) infusion 15 mg/hr (05/10/19 0620)  . heparin 1,600 Units/hr (05/09/19 1727)  . piperacillin-tazobactam (ZOSYN)  IV 2.25 g (05/10/19 1610)    Scheduled Medications: . budesonide  1 puff Inhalation BID  . calcitRIOL  0.25 mcg  Oral Daily  . Chlorhexidine Gluconate Cloth  6 each Topical Daily  . feeding supplement (GLUCERNA SHAKE)  237 mL Oral BID BM  . hydrocortisone sodium succinate  50 mg Intravenous Q12H  . Ipratropium-Albuterol  1 puff Inhalation Q6H  . levETIRAcetam  500 mg Oral BID  . metoprolol tartrate  25 mg Oral BID  . pantoprazole  40 mg Oral Daily  . sodium chloride flush  3 mL Intravenous Q12H    have reviewed scheduled and prn medications.  Physical Exam: Patient is in [place] isolation.  Physical exam in person was not done in an effort to preserve PPE and to avoid exposure to other providers.  Labs, vital signs and notes from hospitalist reviewed   05/10/2019,9:02 AM  LOS: 4 days

## 2019-05-11 DIAGNOSIS — I4819 Other persistent atrial fibrillation: Secondary | ICD-10-CM | POA: Diagnosis not present

## 2019-05-11 LAB — CULTURE, BLOOD (ROUTINE X 2)
Culture: NO GROWTH
Culture: NO GROWTH

## 2019-05-11 LAB — COMPREHENSIVE METABOLIC PANEL
ALT: 80 U/L — ABNORMAL HIGH (ref 0–44)
AST: 36 U/L (ref 15–41)
Albumin: 2.3 g/dL — ABNORMAL LOW (ref 3.5–5.0)
Alkaline Phosphatase: 71 U/L (ref 38–126)
Anion gap: 12 (ref 5–15)
BUN: 49 mg/dL — ABNORMAL HIGH (ref 8–23)
CO2: 23 mmol/L (ref 22–32)
Calcium: 8.2 mg/dL — ABNORMAL LOW (ref 8.9–10.3)
Chloride: 103 mmol/L (ref 98–111)
Creatinine, Ser: 4.42 mg/dL — ABNORMAL HIGH (ref 0.61–1.24)
GFR calc Af Amer: 15 mL/min — ABNORMAL LOW (ref >60)
GFR calc non Af Amer: 13 mL/min — ABNORMAL LOW (ref >60)
Glucose, Bld: 126 mg/dL — ABNORMAL HIGH (ref 70–99)
Potassium: 3.4 mmol/L — ABNORMAL LOW (ref 3.5–5.1)
Sodium: 138 mmol/L (ref 135–145)
Total Bilirubin: 1 mg/dL (ref 0.3–1.2)
Total Protein: 5.4 g/dL — ABNORMAL LOW (ref 6.5–8.1)

## 2019-05-11 LAB — GLUCOSE, CAPILLARY
Glucose-Capillary: 100 mg/dL — ABNORMAL HIGH (ref 70–99)
Glucose-Capillary: 104 mg/dL — ABNORMAL HIGH (ref 70–99)
Glucose-Capillary: 127 mg/dL — ABNORMAL HIGH (ref 70–99)
Glucose-Capillary: 130 mg/dL — ABNORMAL HIGH (ref 70–99)
Glucose-Capillary: 150 mg/dL — ABNORMAL HIGH (ref 70–99)
Glucose-Capillary: 189 mg/dL — ABNORMAL HIGH (ref 70–99)
Glucose-Capillary: 196 mg/dL — ABNORMAL HIGH (ref 70–99)

## 2019-05-11 LAB — HEPARIN LEVEL (UNFRACTIONATED)
Heparin Unfractionated: 0.27 IU/mL — ABNORMAL LOW (ref 0.30–0.70)
Heparin Unfractionated: 0.52 IU/mL (ref 0.30–0.70)

## 2019-05-11 LAB — MAGNESIUM: Magnesium: 1.4 mg/dL — ABNORMAL LOW (ref 1.7–2.4)

## 2019-05-11 MED ORDER — MAGNESIUM SULFATE 2 GM/50ML IV SOLN
2.0000 g | Freq: Once | INTRAVENOUS | Status: DC
  Filled 2019-05-11: qty 50

## 2019-05-11 MED ORDER — ZOLPIDEM TARTRATE 5 MG PO TABS
5.0000 mg | ORAL_TABLET | Freq: Every evening | ORAL | Status: DC | PRN
  Administered 2019-05-11 – 2019-05-14 (×3): 5 mg via ORAL
  Filled 2019-05-11 (×4): qty 1

## 2019-05-11 MED ORDER — POTASSIUM CHLORIDE CRYS ER 20 MEQ PO TBCR
40.0000 meq | EXTENDED_RELEASE_TABLET | Freq: Once | ORAL | Status: AC
  Administered 2019-05-11: 40 meq via ORAL
  Filled 2019-05-11: qty 2

## 2019-05-11 MED ORDER — MAGNESIUM OXIDE 400 (241.3 MG) MG PO TABS
800.0000 mg | ORAL_TABLET | Freq: Every day | ORAL | Status: DC
  Administered 2019-05-11 – 2019-05-21 (×10): 800 mg via ORAL
  Filled 2019-05-11 (×11): qty 2

## 2019-05-11 MED ORDER — ZOLPIDEM TARTRATE 5 MG PO TABS
5.0000 mg | ORAL_TABLET | Freq: Once | ORAL | Status: AC
  Administered 2019-05-11: 5 mg via ORAL
  Filled 2019-05-11: qty 1

## 2019-05-11 MED ORDER — METOPROLOL TARTRATE 50 MG PO TABS
50.0000 mg | ORAL_TABLET | Freq: Two times a day (BID) | ORAL | Status: DC
  Administered 2019-05-11 – 2019-05-12 (×2): 50 mg via ORAL
  Filled 2019-05-11 (×2): qty 1

## 2019-05-11 MED FILL — Diltiazem HCl IV Soln 125 MG/25ML (5 MG/ML): INTRAVENOUS | Qty: 25 | Status: AC

## 2019-05-11 MED FILL — Dextrose Inj 5%: INTRAVENOUS | Qty: 100 | Status: AC

## 2019-05-11 NOTE — Progress Notes (Signed)
PROGRESS NOTE    Dylan Goodman  ZJQ:[id number] DOB: 1951-05-04 DOA: 05/06/2019 PCP: Steele Sizer, MD   Brief Narrative:  Patient is a 68 year old male with history of atrial fibrillation, GI bleed, coronary artery disease status post CABG, nasopharyngeal cancer, cardiomyopathy, chronic depression, CKD stage 4, diabetes mellitus, GI bleed , peripheral vascular disease, previous smoker, recent Covid-19 infection about 3 weeks ago before admission who presented to [facility] with seizure.  He sustained a cardiac arrest there in the emergency department after an seizure episode.  Due to Covid positivity he was sent to [facility] and was admitted under PCCM service.  He was defibrillated after cardiac arrest.  Intubated for airway protection.  Currently extubated and maintaining his saturation on nasal cannula.  Patient transferred to [facility] service on 05/08/19.  Hospital course remarkable for development of AKI on CKD,Afib with RVR.Cardiology,nephrology following.  Assessment & Plan:   Active Problems:   Unspecified atrial fibrillation (HCC)   Acute respiratory failure with hypoxemia (HCC)   Cardiac arrest (HCC)   Prolonged QT interval   Status post cardiac arrest: Initial rhythm was reported to be VT with conversion to A. fib RVR so he was defibrillated.  Downtime estimated  at 11 minutes.  Cardiology following.  Currently hemodynamically stable. Echocardiogram done on 05/08/2019 showed ejection fraction of 40- 45% showing improvement on comparison to his last echo.  A. fib with RVR: Hospital course remarkable for Afib with RVR  .  Cardiology following.  On Eliquis at home for anticoagulation which cannot be given because of acute kidney injury.  Currently on heparin drip.Will folllow up with cardiology for recommendation on anticoagulation.  Heart rate fluctuates between 80-120. Started on lopressor.  AKI on CKD stage 4: Creatinine trended down today and is in range of 4 . Baseline creatinine  around 2.5-3. AKI most likely secondary to ATN due to  hypotension secondary to cardiac arrest.  Also has metabolic acidosis.  Started on bicarb and sterile water but now stopped. Patient voiding well.Neprhology following.  Acute hypoxic respiratory failure: Intubated for airway protection.  Currently on oxygen at 2 to 3 L/min.  Not on oxygen at home.  Chest x-ray on 05/07/2019 showed stable bibasilar opacities.Continue to try weaning oxygen.Completed antibiotic course  COVID-19 infection: Diagnosed with COVID about 3 weeks ago.  Covid test on 05/04/2019 was positive..  No COVID specific treatment.  Continue supportive care.  On airborne precaution.  Hypotension: Required Levophed drip for maintenance of blood pressure.  Currently off drip.  Maintaining blood pressure.  Elevated liver enzymes: Most likely secondary to shock liver.  Significantly improved now.  Seizure: Presented initially with seizure.  Had an seizure episode in the emergency department.  Started on Keppra.  Neurology was following.  Needs follow-up with neurology on discharge.  Debility/deconditioning: Patient evaluated by physical therapy and recommended home health on discharge.  Hypokalemia/hypomagnesemia: Supplemented    Nutrition Problem: Increased nutrient needs Etiology: acute illness      DVT prophylaxis: Heparin IV Code Status: Full Family Communication: Called wife on phone on 05/09/19 Disposition Plan:   Most likely discharge planning to home when stable after further improvement in the kidney function,heart rate control and cardiology/nephrology clearance   Consultants: PCCM,cardiology  Procedures: Intubation/extubation  Antimicrobials:  Anti-infectives (From admission, onward)   Start     Dose/Rate Route Frequency Ordered Stop   05/09/19 1000  piperacillin-tazobactam (ZOSYN) IVPB 2.25 g  Status:  Discontinued     2.25 g 100 mL/hr over 30 Minutes Intravenous  Every 8 hours 05/09/19 0833 05/10/19 1346    05/08/19 1000  piperacillin-tazobactam (ZOSYN) IVPB 3.375 g  Status:  Discontinued     3.375 g 12.5 mL/hr over 240 Minutes Intravenous Every 12 hours 05/08/19 0930 05/09/19 0833   05/08/19 0630  piperacillin-tazobactam (ZOSYN) IVPB 3.375 g  Status:  Discontinued     3.375 g 12.5 mL/hr over 240 Minutes Intravenous Every 12 hours 05/07/19 2040 05/08/19 0930   05/07/19 2200  vancomycin (VANCOCIN) 1,750 mg in sodium chloride 0.9 % 500 mL IVPB  Status:  Discontinued     1,750 mg 250 mL/hr over 120 Minutes Intravenous Every 48 hours 05/06/19 1912 05/07/19 1039   05/07/19 2200  vancomycin (VANCOCIN) 1,500 mg in sodium chloride 0.9 % 500 mL IVPB  Status:  Discontinued     1,500 mg 250 mL/hr over 120 Minutes Intravenous Every 48 hours 05/07/19 1039 05/07/19 1228   05/07/19 0200  piperacillin-tazobactam (ZOSYN) IVPB 3.375 g  Status:  Discontinued     3.375 g 12.5 mL/hr over 240 Minutes Intravenous Every 8 hours 05/06/19 1853 05/07/19 2040   05/06/19 1930  piperacillin-tazobactam (ZOSYN) IVPB 3.375 g     3.375 g 100 mL/hr over 30 Minutes Intravenous  Once 05/06/19 1853 05/06/19 2156      Subjective:  Patient seen and examined at bedside this morning.  Hemodynamically stable.  He still on 2 to 3 L of oxygen per minute.  His heart rate fluctuates between 80-120/min.  Looks little sad, disappointed because he is not able to go home.  Denies any complaints. Objective: Vitals:   05/11/19 0911 05/11/19 0913 05/11/19 0945 05/11/19 1042  BP:      Pulse: (!) 59 (!) 105 79 87  Resp: (!) 29 17 (!) 23 (!) 23  Temp:      TempSrc:      SpO2: 92% 92% 94% 96%  Weight:      Height:        Intake/Output Summary (Last 24 hours) at 05/11/2019 1333 Last data filed at 05/11/2019 1106 Gross per 24 hour  Intake 795.97 ml  Output 975 ml  Net -179.03 ml   Filed Weights   05/08/19 0347 05/09/19 0422 05/09/19 2324  Weight: 93.7 kg 102.7 kg 104.8 kg    Examination:  General exam: Not in distress,average  built, appears older than age.Very pleasant gentleman HEENT:PERRL,Oral mucosa moist, Ear/Nose normal on gross exam Respiratory system: Bilateral equal air entry, normal vesicular breath sounds, no wheezes or crackles heard with yellow steth Cardiovascular system: Afib with RVR , No JVD, murmurs, rubs, gallops or clicks. No pedal edema. Gastrointestinal system: Abdomen is nondistended, soft and nontender. No organomegaly or masses felt. Normal bowel sounds heard. Central nervous system: Alert and oriented. No focal neurological deficits. Extremities: No edema, no clubbing ,no cyanosis, distal peripheral pulses palpable. Skin: No rashes, lesions or ulcers,no icterus ,no pallor      Data Reviewed: I have personally reviewed following labs and imaging studies  CBC: Recent Labs  Lab 05/06/19 1205  05/06/19 2052 05/07/19 0342 05/07/19 0400 05/07/19 1801 05/08/19 0344 05/09/19 0510 05/10/19 0651  WBC 4.7  --  11.1* 9.5  --   --  8.8 8.3 9.2  NEUTROABS 1.5*  --   --   --   --   --   --   --   --   HGB 11.5*   < > 12.0* 12.3* 15.0 9.9* 10.0* 10.0* 11.0*  HCT 40.4   < > 37.4* 40.3  44.0 29.0* 33.3* 31.3* 34.0*  MCV 100.2*  --  93.0 96.6  --   --  96.8 91.5 91.2  PLT 244  --  342 357  --   --  230 230 227   < > = values in this interval not displayed.   Basic Metabolic Panel: Recent Labs  Lab 05/07/19 0342  05/07/19 1439 05/07/19 1801 05/08/19 0004 05/08/19 0344 05/09/19 0510 05/10/19 0651 05/11/19 0602  NA 140   < > 141 141 138  --  139 140 138  K 4.8   < > 4.7 4.3 4.0  --  3.5 3.6 3.4*  CL 109  --  112*  --  110  --  105 103 103  CO2 13*  --  15*  --  13*  --  20* 21* 23  GLUCOSE 172*  --  141*  --  100*  --  168* 161* 126*  BUN 42*  --  44*  --  46*  --  48* 48* 49*  CREATININE 3.81*  --  4.62*  --  4.99*  --  5.17* 4.66* 4.42*  CALCIUM 6.8*  --  6.5*  --  6.6*  --  7.3* 8.0* 8.2*  MG 2.2  --  2.2  --   --  1.7 1.5* 1.5* 1.4*  PHOS 5.8*  --  6.7*  --   --  5.2* 4.5 4.9*   --    < > = values in this interval not displayed.   GFR: Estimated Creatinine Clearance: 20.5 mL/min (A) (by C-G formula based on SCr of 4.42 mg/dL (H)). Liver Function Tests: Recent Labs  Lab 05/06/19 1205 05/06/19 2052 05/08/19 0344 05/09/19 0510 05/10/19 0651 05/11/19 0602  AST 628* 1,197* 164* 52*  --  36  ALT 329* 376* 187* 116*  --  80*  ALKPHOS 61 91 61 56  --  71  BILITOT 0.6 0.9 1.0 0.9  --  1.0  PROT 5.3* 5.5* 5.2* 5.0*  --  5.4*  ALBUMIN 2.4* 2.4* 2.2* 2.1* 2.2* 2.3*   No results for input(s): LIPASE, AMYLASE in the last 168 hours. No results for input(s): AMMONIA in the last 168 hours. Coagulation Profile: Recent Labs  Lab 05/05/19 2114 05/06/19 2052 05/08/19 0344  INR 1.4* 1.5* 1.5*   Cardiac Enzymes: No results for input(s): CKTOTAL, CKMB, CKMBINDEX, TROPONINI in the last 168 hours. BNP (last 3 results) No results for input(s): PROBNP in the last 8760 hours. HbA1C: No results for input(s): HGBA1C in the last 72 hours. CBG: Recent Labs  Lab 05/10/19 1553 05/10/19 2106 05/11/19 0014 05/11/19 0419 05/11/19 0717  GLUCAP 166* 223* 150* 104* 100*   Lipid Profile: No results for input(s): CHOL, HDL, LDLCALC, TRIG, CHOLHDL, LDLDIRECT in the last 72 hours. Thyroid Function Tests: No results for input(s): TSH, T4TOTAL, FREET4, T3FREE, THYROIDAB in the last 72 hours. Anemia Panel: No results for input(s): VITAMINB12, FOLATE, FERRITIN, TIBC, IRON, RETICCTPCT in the last 72 hours. Sepsis Labs: Recent Labs  Lab 05/05/19 2114 05/06/19 2052 05/07/19 0020  PROCALCITON 0.14 14.10  --   LATICACIDVEN 1.2 1.1 0.9    Recent Results (from the past 240 hour(s))  SARS Coronavirus 2 [facility] order, Performed in [facility] hospital lab) Nasopharyngeal Nasopharyngeal Swab     Status: Abnormal   Collection Time: 05/04/19 11:25 AM   Specimen: Nasopharyngeal Swab  Result Value Ref Range Status   SARS Coronavirus 2 POSITIVE (A) NEGATIVE Final    Comment: RESULT  CALLED  TO, READ BACK BY AND VERIFIED WITH: ASHLEY MAY AT 1610 ON 05/04/2019 [place]. (NOTE) If result is NEGATIVE SARS-CoV-2 target nucleic acids are NOT DETECTED. The SARS-CoV-2 RNA is generally detectable in upper and lower  respiratory specimens during the acute phase of infection. The lowest  concentration of SARS-CoV-2 viral copies this assay can detect is 250  copies / mL. A negative result does not preclude SARS-CoV-2 infection  and should not be used as the sole basis for treatment or other  patient management decisions.  A negative result may occur with  improper specimen collection / handling, submission of specimen other  than nasopharyngeal swab, presence of viral mutation(s) within the  areas targeted by this assay, and inadequate number of viral copies  (<250 copies / mL). A negative result must be combined with clinical  observations, patient history, and epidemiological information. If result is POSITIVE SARS-CoV-2 target nucleic acids are DETECTED.  The SARS-CoV-2 RNA is generally detectable in upper and lower  respiratory specimens during the acute phase of infection.  Positive  results are indicative of active infection with SARS-CoV-2.  Clinical  correlation with patient history and other diagnostic information is  necessary to determine patient infection status.  Positive results do  not rule out bacterial infection or co-infection with other viruses. If result is PRESUMPTIVE POSTIVE SARS-CoV-2 nucleic acids MAY BE PRESENT.   A presumptive positive result was obtained on the submitted specimen  and confirmed on repeat testing.  While 2019 novel coronavirus  (SARS-CoV-2) nucleic acids may be present in the submitted sample  additional confirmatory testing may be necessary for epidemiological  and / or clinical management purposes  to differentiate between  SARS-CoV-2 and other Sarbecovirus currently known to infect humans.  If clinically indicated additional testing  with an alternate test  methodology [phone]) i s advised. The SARS-CoV-2 RNA is generally  detectable in upper and lower respiratory specimens during the acute  phase of infection. The expected result is Negative. Fact Sheet for Patients:  StrictlyIdeas.no Fact Sheet for Healthcare Providers: BankingDealers.co.za This test is not yet approved or cleared by the [place] FDA and has been authorized for detection and/or diagnosis of SARS-CoV-2 by FDA under an Emergency Use Authorization (EUA).  This EUA will remain in effect (meaning this test can be used) for the duration of the COVID-19 declaration under Section 564(b)(1) of the Act, 21 U.S.C. section 360bbb-3(b)(1), unless the authorization is terminated or revoked sooner. Performed at [facility], [address] [place], [place] [address]   Blood Culture (routine x 2)     Status: None   Collection Time: 05/05/19  9:14 PM   Specimen: BLOOD  Result Value Ref Range Status   Specimen Description BLOOD LEFT ANTECUBITAL  Final   Special Requests   Final    BOTTLES DRAWN AEROBIC AND ANAEROBIC Blood Culture results may not be optimal due to an excessive volume of blood received in culture bottles   Culture   Final    NO GROWTH 5 DAYS Performed at [facility], [address] [place], [place] [address]    Report Status 05/10/2019 FINAL  Final  Blood Culture (routine x 2)     Status: None   Collection Time: 05/05/19  9:40 PM   Specimen: BLOOD  Result Value Ref Range Status   Specimen Description BLOOD BLOOD RIGHT WRIST  Final   Special Requests   Final    BOTTLES DRAWN AEROBIC AND ANAEROBIC Blood Culture adequate volume  Culture   Final    NO GROWTH 5 DAYS Performed at [facility], [address] [place], [place] [address]    Report Status 05/10/2019 FINAL  Final  MRSA PCR Screening     Status: None   Collection Time: 05/06/19  5:31 PM    Specimen: Nasal Mucosa; Nasopharyngeal  Result Value Ref Range Status   MRSA by PCR NEGATIVE NEGATIVE Final    Comment:        The GeneXpert MRSA Assay (FDA approved for NASAL specimens only), is one component of a comprehensive MRSA colonization surveillance program. It is not intended to diagnose MRSA infection nor to guide or monitor treatment for MRSA infections. Performed at [facility], [place] [address], [place], [place] [address]   Culture, blood (routine x 2)     Status: None   Collection Time: 05/06/19  8:10 PM   Specimen: BLOOD RIGHT HAND  Result Value Ref Range Status   Specimen Description BLOOD RIGHT HAND  Final   Special Requests   Final    BOTTLES DRAWN AEROBIC AND ANAEROBIC Blood Culture results may not be optimal due to an inadequate volume of blood received in culture bottles   Culture   Final    NO GROWTH 5 DAYS Performed at [facility], [place] [address] [place], [place] [address]    Report Status 05/11/2019 FINAL  Final  Culture, blood (routine x 2)     Status: None   Collection Time: 05/06/19  8:33 PM   Specimen: BLOOD LEFT HAND  Result Value Ref Range Status   Specimen Description BLOOD LEFT HAND  Final   Special Requests   Final    AEROBIC BOTTLE ONLY Blood Culture results may not be optimal due to an inadequate volume of blood received in culture bottles   Culture   Final    NO GROWTH 5 DAYS Performed at [facility], [place] [address] [place], [place] [address]    Report Status 05/11/2019 FINAL  Final  Urine culture     Status: None   Collection Time: 05/06/19  9:17 PM   Specimen: Urine, Random  Result Value Ref Range Status   Specimen Description URINE, RANDOM  Final   Special Requests NONE  Final   Culture   Final    NO GROWTH Performed at [facility], [place] [address], [place], [place] [address]    Report Status 05/07/2019 FINAL  Final  Culture, respiratory (tracheal aspirate)     Status: None   Collection Time:  05/07/19 10:24 AM   Specimen: Tracheal Aspirate; Respiratory  Result Value Ref Range Status   Specimen Description TRACHEAL ASPIRATE  Final   Special Requests NONE  Final   Gram Stain NO WBC SEEN RARE BUDDING YEAST SEEN   Final   Culture   Final    Consistent with normal respiratory flora. Performed at [facility], [place] [address], [place], [place] [address]    Report Status 05/09/2019 FINAL  Final         Radiology Studies: No results found.      Scheduled Meds: . budesonide  1 puff Inhalation BID  . calcitRIOL  0.25 mcg Oral Daily  . Chlorhexidine Gluconate Cloth  6 each Topical Daily  . feeding supplement (GLUCERNA SHAKE)  237 mL Oral BID BM  . Ipratropium-Albuterol  1 puff Inhalation Q6H  . levETIRAcetam  500 mg Oral BID  . magnesium oxide  800  mg Oral Daily  . metoprolol tartrate  50 mg Oral BID  . pantoprazole  40 mg Oral Daily  . sodium chloride flush  3 mL Intravenous Q12H   Continuous Infusions: . sodium chloride    . diltiazem (CARDIZEM) infusion 15 mg/hr (05/11/19 0546)  . heparin 1,750 Units/hr (05/11/19 0855)     LOS: 5 days    Time spent: 35 mins.More than 50% of that time was spent in counseling and/or coordination of care.      Shelly Coss, MD [facility] Pager [phone]  If 7PM-7AM, please contact night-coverage [email or web address] Password [email or web address] 05/11/2019, 1:33 PM

## 2019-05-11 NOTE — Progress Notes (Signed)
PT Cancellation Note  Patient Details Name: Dylan Goodman MRN: [id number] DOB: 1950-08-03   Cancelled Treatment:    Reason Eval/Treat Not Completed: (P) Medical issues which prohibited therapy Pt with increased dizziness with movement. Pt to be seen by vestibular PT tomorrow.   Philopateer Strine B. Migdalia Dk PT, DPT Acute Rehabilitation Services Pager [phone] Office [phone]    [place] 05/11/2019, 5:43 PM

## 2019-05-11 NOTE — Progress Notes (Signed)
Patient ID: Dylan Goodman, male   DOB: 09-11-1950, 67 y.o.   MRN: [id number] S: No events overnight O:BP 134/81 (BP Location: Right Arm)   Pulse 87   Temp 98.4 F (36.9 C) (Oral)   Resp (!) 23   Ht 5\' 11"  (1.803 m)   Wt 104.8 kg   SpO2 96%   BMI 32.22 kg/m   Intake/Output Summary (Last 24 hours) at 05/11/2019 1444 Last data filed at 05/11/2019 1106 Gross per 24 hour  Intake 795.97 ml  Output 975 ml  Net -179.03 ml   Intake/Output: I/O last 3 completed shifts: In: 1202 [P.O.:240; I.V.:870.2; IV Piggyback:91.8] Out: 1375 [GBTDV:7616]  Intake/Output this shift:  Total I/O In: -  Out: 300 [Urine:300] Weight change:  Physical exam: unable to complete due to COVID + status.  In order to preserve PPE equipment and to minimize exposure to providers.  Notes from other caregivers reviewed  Recent Labs  Lab 05/05/19 1832 05/06/19 1205  05/06/19 2052 05/07/19 0342 05/07/19 0400 05/07/19 1439 05/07/19 1801 05/08/19 0004 05/08/19 0344 05/09/19 0510 05/10/19 0651 05/11/19 0602  NA 138 141   < > 139 140 135 141 141 138  --  139 140 138  K 3.8 4.1   < > 3.7 4.8 4.5 4.7 4.3 4.0  --  3.5 3.6 3.4*  CL 105 112*  --  110 109  --  112*  --  110  --  105 103 103  CO2 11* 10*  --  14* 13*  --  15*  --  13*  --  20* 21* 23  GLUCOSE 198* 246*  --  146* 172*  --  141*  --  100*  --  168* 161* 126*  BUN 44* 39*  --  42* 42*  --  44*  --  46*  --  48* 48* 49*  CREATININE 3.06* 2.83*  --  3.63* 3.81*  --  4.62*  --  4.99*  --  5.17* 4.66* 4.42*  ALBUMIN 3.1* 2.4*  --  2.4*  --   --   --   --   --  2.2* 2.1* 2.2* 2.3*  CALCIUM 7.1* 6.3*  --  6.2* 6.8*  --  6.5*  --  6.6*  --  7.3* 8.0* 8.2*  PHOS  --   --   --  5.6* 5.8*  --  6.7*  --   --  5.2* 4.5 4.9*  --   AST 44* 628*  --  1,197*  --   --   --   --   --  164* 52*  --  36  ALT 22 329*  --  376*  --   --   --   --   --  187* 116*  --  80*   < > = values in this interval not displayed.   Liver Function Tests: Recent Labs  Lab  05/08/19 0344 05/09/19 0510 05/10/19 0651 05/11/19 0602  AST 164* 52*  --  36  ALT 187* 116*  --  80*  ALKPHOS 61 56  --  71  BILITOT 1.0 0.9  --  1.0  PROT 5.2* 5.0*  --  5.4*  ALBUMIN 2.2* 2.1* 2.2* 2.3*   No results for input(s): LIPASE, AMYLASE in the last 168 hours. No results for input(s): AMMONIA in the last 168 hours. CBC: Recent Labs  Lab 05/06/19 1205  05/06/19 2052 05/07/19 0342  05/08/19 0344 05/09/19 0510 05/10/19 0651  WBC 4.7  --  11.1* 9.5  --  8.8 8.3 9.2  NEUTROABS 1.5*  --   --   --   --   --   --   --   HGB 11.5*   < > 12.0* 12.3*   < > 10.0* 10.0* 11.0*  HCT 40.4   < > 37.4* 40.3   < > 33.3* 31.3* 34.0*  MCV 100.2*  --  93.0 96.6  --  96.8 91.5 91.2  PLT 244  --  342 357  --  230 230 227   < > = values in this interval not displayed.   Cardiac Enzymes: No results for input(s): CKTOTAL, CKMB, CKMBINDEX, TROPONINI in the last 168 hours. CBG: Recent Labs  Lab 05/10/19 2106 05/11/19 0014 05/11/19 0419 05/11/19 0717 05/11/19 1337  GLUCAP 223* 150* 104* 100* 130*    Iron Studies: No results for input(s): IRON, TIBC, TRANSFERRIN, FERRITIN in the last 72 hours. Studies/Results: No results found. . budesonide  1 puff Inhalation BID  . calcitRIOL  0.25 mcg Oral Daily  . Chlorhexidine Gluconate Cloth  6 each Topical Daily  . feeding supplement (GLUCERNA SHAKE)  237 mL Oral BID BM  . Ipratropium-Albuterol  1 puff Inhalation Q6H  . levETIRAcetam  500 mg Oral BID  . magnesium oxide  800 mg Oral Daily  . metoprolol tartrate  50 mg Oral BID  . pantoprazole  40 mg Oral Daily  . sodium chloride flush  3 mL Intravenous Q12H    BMET    Component Value Date/Time   NA 138 05/11/2019 0602   NA 138 03/25/2019   K 3.4 (L) 05/11/2019 0602   CL 103 05/11/2019 0602   CO2 23 05/11/2019 0602   GLUCOSE 126 (H) 05/11/2019 0602   BUN 49 (H) 05/11/2019 0602   BUN 43 (A) 03/25/2019   CREATININE 4.42 (H) 05/11/2019 0602   CREATININE 3.36 (H) 02/12/2019 1450    CALCIUM 8.2 (L) 05/11/2019 0602   GFRNONAA 13 (L) 05/11/2019 0602   GFRNONAA 18 (L) 02/12/2019 1450   GFRAA 15 (L) 05/11/2019 0602   GFRAA 21 (L) 02/12/2019 1450   CBC    Component Value Date/Time   WBC 9.2 05/10/2019 0651   RBC 3.73 (L) 05/10/2019 0651   HGB 11.0 (L) 05/10/2019 0651   HCT 34.0 (L) 05/10/2019 0651   PLT 227 05/10/2019 0651   MCV 91.2 05/10/2019 0651   MCH 29.5 05/10/2019 0651   MCHC 32.4 05/10/2019 0651   RDW 16.0 (H) 05/10/2019 0651   LYMPHSABS 2.4 05/06/2019 1205   MONOABS 0.4 05/06/2019 1205   EOSABS 0.0 05/06/2019 1205   BASOSABS 0.0 05/06/2019 1205     Assessment/Plan:  1. AKI/CKD- presumably due to ischemic ATN in setting of cardiac arrest with prolonged hypotension.  Cr continues to slowly improve and UOP has remained stable.  No indication for dialysis at this time.  Will continue to follow.  2. Metabolic acidosis- improved with IV bicarb. 3. HTN/volume- stable  4. Anemia- Hgb improving 5. CAD s/p CABG with CMP EF 35-40%- s/p cardiac arrest (VT) 6. Atrial fib with RVR- per Cardiology  Donetta Potts, MD [facility] [phone]

## 2019-05-11 NOTE — Progress Notes (Signed)
ANTICOAGULATION CONSULT NOTE   Pharmacy Consult for Heparin  Indication: atrial fibrillation   No Known Allergies  Patient Measurements: Height: 5\' 11"  (180.3 cm) Weight: 231 lb 0.7 oz (104.8 kg) IBW/kg (Calculated) : 75.3 Heparin Dosing Weight:  96.2 kg   Vital Signs: Temp: 98.4 F (36.9 C) (10/12 0717) Temp Source: Oral (10/12 0717) BP: 134/81 (10/12 0717) Pulse Rate: 80 (10/12 0717)  Labs: Recent Labs    05/09/19 0510 05/10/19 0651 05/11/19 0602  HGB 10.0* 11.0*  --   HCT 31.3* 34.0*  --   PLT 230 227  --   HEPARINUNFRC 0.30 0.58 0.27*  CREATININE 5.17* 4.66* 4.42*    Estimated Creatinine Clearance: 20.5 mL/min (A) (by C-G formula based on SCr of 4.42 mg/dL (H)).  Assessment: 68 year old male with history of afib presenting with cardiac arrest and seizure-like activity transfer from [facility] - recent covid-19 in sept. LE Dopplers negative for DVT on 10/8. Pt was on apixaban PTA, uncertain when last dose was given, holding and using IV heparin while patient is experiencing AKI  Hep Lvl slightly low this am at 0.27  CBC stable  Goal of Therapy:  Heparin level 0.3-0.7 units/ml  Monitor platelets by anticoagulation protocol: Yes   Plan:  Increase heparin to 1750 units/hr Re-check lvls at 1630 Daily hep lvl cbc F/u plans to return to apixaban when renal function improves more  Levester Fresh, PharmD, BCPS, BCCCP Clinical Pharmacist [phone]  Please check AMION for all [place] numbers  05/11/2019 8:35 AM

## 2019-05-11 NOTE — Progress Notes (Signed)
Occupational Therapy Treatment Patient Details Name: Dylan Goodman MRN: [id number] DOB: 1950-12-08 Today's Date: 05/11/2019    History of present illness Pt is a 68 yo male s/p recent hospitalization 9/13 for COVID+ PNA. THis admission due to dizziness, witnessed 2-minute tonic/clonic type seizure. COVID Test again positive. 10/7 when he abruptly developed respiratory distress and tachycardia requiring CPR. 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   OT comments  Pt with difficulty engaging in BADL this date 2/2 to increased dizziness with all mobility. Pt completed bed mobility at min A and having increased dizziness impacting safety and increasing anxiety. Pt on 2.5 L O2 with saturations remaining 93 and above, but respirations reaching 35. Cues for deep and controlled breathing, stabilizing RR. Attempted to t/f to [facility], pt increasing in dizziness once again. Deferred to bed pan with min A bed level. D/c recommendations remain appropriate. Will continue to follow per POC.    Follow Up Recommendations  Home health OT;Supervision/Assistance - 24 hour    Equipment Recommendations  None recommended by OT    Recommendations for Other Services      Precautions / Restrictions Precautions Precautions: Fall;Other (comment) Precaution Comments: watch O2; dizziness with OOB Restrictions Weight Bearing Restrictions: No       Mobility Bed Mobility Overal bed mobility: Needs Assistance Bed Mobility: Sidelying to Sit   Sidelying to sit: Min assist       General bed mobility comments: for trunk elevation and safety  Transfers                 General transfer comment: NT- dizziness decreased ability to t/f    Balance Overall balance assessment: Needs assistance   Sitting balance-Leahy Scale: Good                                     ADL either  performed or assessed with clinical judgement   ADL Overall ADL's : Needs assistance/impaired                         Toilet Transfer: Minimal assistance Toilet Transfer Details (indicate cue type and reason): min A bed level for bed pan. Attempted BSC, pt too dizzy to t/f OOB safely         Functional mobility during ADLs: Minimal assistance(min A for EOB) General ADL Comments: limited by dizziness largely this date, could not sustain OOB activity     Vision   Vision Assessment?: No apparent visual deficits   Perception     Praxis      Cognition Arousal/Alertness: Awake/alert Behavior During Therapy: Anxious Overall Cognitive Status: Within Functional Limits for tasks assessed                                 General Comments: anxious this date, needing cues to decrease RR        Exercises     Shoulder Instructions       General Comments      Pertinent Vitals/ Pain       Pain Assessment: Faces Faces Pain Scale: Hurts little more Pain Location: chest Pain Descriptors / Indicators: Sore Pain Intervention(s): Monitored during session;Utilized relaxation techniques  Home Living  Prior Functioning/Environment              Frequency  Min 2X/week        Progress Toward Goals  OT Goals(current goals can now be found in the care plan section)  Progress towards OT goals: Progressing toward goals  Acute Rehab OT Goals Patient Stated Goal: to get back home and fish OT Goal Formulation: With patient Time For Goal Achievement: 05/22/19 Potential to Achieve Goals: Good  Plan      Co-evaluation                 AM-PAC OT "6 Clicks" Daily Activity     Outcome Measure   Help from another person eating meals?: None Help from another person taking care of personal grooming?: A Little Help from another person toileting, which includes using toliet, bedpan, or urinal?:  A Little Help from another person bathing (including washing, rinsing, drying)?: A Lot Help from another person to put on and taking off regular upper body clothing?: A Little Help from another person to put on and taking off regular lower body clothing?: A Lot 6 Click Score: 17    End of Session Equipment Utilized During Treatment: Gait belt;Oxygen  OT Visit Diagnosis: Unsteadiness on feet (R26.81);Muscle weakness (generalized) (M62.81)   Activity Tolerance Patient tolerated treatment well   Patient Left in bed;with call bell/phone within reach;with bed alarm set;with nursing/sitter in room   Nurse Communication          Time: 1527-1600 OT Time Calculation (min): 33 min  Charges: OT General Charges $OT Visit: 1 Visit OT Treatments $Self Care/Home Management : 23-37 mins  Zenovia Jarred, MSOT, OTR/L [facility] Office: [phone]  Zenovia Jarred 05/11/2019, 5:23 PM

## 2019-05-11 NOTE — Progress Notes (Addendum)
   Cardiologist: Dr. Nehemiah Massed  Past medical history significant for CAD s/p CABG, cardiomyopathy with LVEF 35 to 40% by echocardiogram in August, PAD status post previous revascularization surgery, atrial fibrillation, type II diabetes mellitus, CKD stage IV, and admission for COVID-19 pneumonia from September 14 through September 19 at [facility] who is being seen today for the evaluation of recent cardiac arrest. Pt had seizure episode on the ED then Vtach followed by rapid afib. Cardioversion done. "downtime" estimated 11 minutes. Initially treated with IV amiodarone, Levophed and heparin. Now on Keppra.   He has AKI related to cardiac arrest with prolonged hypotension. Urine output has picked up. Nephrology is seeing pt.   Lexiscan myoview in 07/2018 showed global hypokinesis, no focal ischemia.   Echo in 05/08/2019 shows EF 45-50% which is higher than previous 35-40%, dilated atria, mild MR.   Currently treated for Atrial fibrillation on IV cardizem and oral metoprolol 25 mg BID with rates controlled, 70's overnight, 80's-90's today with occ briefly up to 120's. Would try to increase beta blocker, pt was on metoprolol 100 mg BID at home in favor over cardizem due to reduced LV function.   On IV heparin for stroke risk reduction. Previously on Eliquis, now on hold due to AKI. Creatinine has been up over 5, 4.42 today. Consider pharmacy recommendations on DOAC vs coumadin in this pt, based on recent evidence.   Daune Perch, AGNP-C [facility] 05/11/2019  10:58 AM Pager: [phone]   I have reviewed the chart, notes and new data.  I agree with PA/NP's note. Telemetry shows mostly well-rate controlled atrial fibrillation.  PLAN: Try to consolidate rate control meds to beta blockers.  Sanda Klein, MD, [place] [phone] 05/11/2019, 1:17 PM

## 2019-05-11 NOTE — Progress Notes (Addendum)
Brodnax for Heparin  Indication: atrial fibrillation   No Known Allergies  Patient Measurements: Height: 5\' 11"  (180.3 cm) Weight: 231 lb 0.7 oz (104.8 kg) IBW/kg (Calculated) : 75.3 Heparin Dosing Weight:  96.2 kg   Vital Signs: Temp: 97.9 F (36.6 C) (10/12 1500) Temp Source: Oral (10/12 1500) BP: 146/86 (10/12 1500) Pulse Rate: 97 (10/12 1500)  Labs: Recent Labs    05/09/19 0510 05/10/19 0651 05/11/19 0602 05/11/19 1707  HGB 10.0* 11.0*  --   --   HCT 31.3* 34.0*  --   --   PLT 230 227  --   --   HEPARINUNFRC 0.30 0.58 0.27* 0.52  CREATININE 5.17* 4.66* 4.42*  --     Estimated Creatinine Clearance: 20.5 mL/min (A) (by C-G formula based on SCr of 4.42 mg/dL (H)).  Assessment: 68 year old male with history of afib presenting with cardiac arrest and seizure-like activity transfer from [name] - recent covid-19 in Sept. LE Dopplers negative for DVT on 10/8. Pt was on apixaban PTA; uncertain when last dose was given; holding apixiban and using IV heparin while patient is experiencing AKI  Heparin level was slightly low this AM at 0.27 units/ml. Heparin infusion was increased to 1750 units/hr; heparin level drawn ~8 hrs after infusion increased to 1750 units/hr was 0.52 units/ml, which is within the goal range for this pt. Per RN, no issues with IV or signs of bleeding observed.  CBC stable; Scr slightly improved at 4.42; CrCl 20.5 ml/min  Goal of Therapy:  Heparin level 0.3-0.7 units/ml  Monitor platelets by anticoagulation protocol: Yes   Plan:  Continue heparin infusion at 1750 units/hr Check 8-hr heparin level, then daily Monitor CBC daily Monitor for signs/symptoms of bleeding F/U plans to return to apixaban when renal function improves more  Gillermina Hu, PharmD, BCPS, [facility] Clinical Pharmacist Please check AMION for all [place] numbers 05/11/2019 6:09 PM

## 2019-05-12 ENCOUNTER — Encounter: Payer: Federal, State, Local not specified - PPO | Admitting: Physical Therapy

## 2019-05-12 DIAGNOSIS — I4819 Other persistent atrial fibrillation: Secondary | ICD-10-CM | POA: Diagnosis not present

## 2019-05-12 LAB — BASIC METABOLIC PANEL
Anion gap: 13 (ref 5–15)
BUN: 50 mg/dL — ABNORMAL HIGH (ref 8–23)
CO2: 24 mmol/L (ref 22–32)
Calcium: 8.6 mg/dL — ABNORMAL LOW (ref 8.9–10.3)
Chloride: 103 mmol/L (ref 98–111)
Creatinine, Ser: 3.99 mg/dL — ABNORMAL HIGH (ref 0.61–1.24)
GFR calc Af Amer: 17 mL/min — ABNORMAL LOW (ref >60)
GFR calc non Af Amer: 15 mL/min — ABNORMAL LOW (ref >60)
Glucose, Bld: 126 mg/dL — ABNORMAL HIGH (ref 70–99)
Potassium: 3.7 mmol/L (ref 3.5–5.1)
Sodium: 140 mmol/L (ref 135–145)

## 2019-05-12 LAB — CBC
HCT: 34.4 % — ABNORMAL LOW (ref 39.0–52.0)
Hemoglobin: 11.1 g/dL — ABNORMAL LOW (ref 13.0–17.0)
MCH: 30.2 pg (ref 26.0–34.0)
MCHC: 32.3 g/dL (ref 30.0–36.0)
MCV: 93.5 fL (ref 80.0–100.0)
Platelets: 248 10*3/uL (ref 150–400)
RBC: 3.68 MIL/uL — ABNORMAL LOW (ref 4.22–5.81)
RDW: 16.7 % — ABNORMAL HIGH (ref 11.5–15.5)
WBC: 10.7 10*3/uL — ABNORMAL HIGH (ref 4.0–10.5)
nRBC: 2.2 % — ABNORMAL HIGH (ref 0.0–0.2)

## 2019-05-12 LAB — GLUCOSE, CAPILLARY
Glucose-Capillary: 112 mg/dL — ABNORMAL HIGH (ref 70–99)
Glucose-Capillary: 113 mg/dL — ABNORMAL HIGH (ref 70–99)
Glucose-Capillary: 129 mg/dL — ABNORMAL HIGH (ref 70–99)
Glucose-Capillary: 130 mg/dL — ABNORMAL HIGH (ref 70–99)
Glucose-Capillary: 132 mg/dL — ABNORMAL HIGH (ref 70–99)
Glucose-Capillary: 142 mg/dL — ABNORMAL HIGH (ref 70–99)

## 2019-05-12 LAB — HEPARIN LEVEL (UNFRACTIONATED): Heparin Unfractionated: 0.39 IU/mL (ref 0.30–0.70)

## 2019-05-12 LAB — MAGNESIUM: Magnesium: 1.2 mg/dL — ABNORMAL LOW (ref 1.7–2.4)

## 2019-05-12 MED ORDER — METOPROLOL TARTRATE 25 MG PO TABS
100.0000 mg | ORAL_TABLET | Freq: Two times a day (BID) | ORAL | Status: DC
  Administered 2019-05-12 – 2019-05-20 (×16): 100 mg via ORAL
  Filled 2019-05-12 (×5): qty 2
  Filled 2019-05-12: qty 4
  Filled 2019-05-12 (×7): qty 2
  Filled 2019-05-12 (×2): qty 1
  Filled 2019-05-12: qty 2
  Filled 2019-05-12: qty 4
  Filled 2019-05-12 (×2): qty 2

## 2019-05-12 MED ORDER — MAGNESIUM SULFATE 2 GM/50ML IV SOLN
2.0000 g | Freq: Once | INTRAVENOUS | Status: AC
  Administered 2019-05-12: 2 g via INTRAVENOUS
  Filled 2019-05-12 (×2): qty 50

## 2019-05-12 MED ORDER — MECLIZINE HCL 12.5 MG PO TABS
12.5000 mg | ORAL_TABLET | Freq: Three times a day (TID) | ORAL | Status: DC | PRN
  Administered 2019-05-13 (×2): 12.5 mg via ORAL
  Filled 2019-05-12 (×3): qty 1

## 2019-05-12 MED ORDER — APIXABAN 5 MG PO TABS
5.0000 mg | ORAL_TABLET | Freq: Two times a day (BID) | ORAL | Status: DC
  Administered 2019-05-12 – 2019-05-21 (×18): 5 mg via ORAL
  Filled 2019-05-12 (×18): qty 1

## 2019-05-12 NOTE — Progress Notes (Signed)
Continues to improve. O2 requirement down to 0.5L/min. AFib rate control also improved. Stopped diltiazem and placed back on his home metoprolol dose of 100 mg twice daily. On apixaban. Renal function continues to improve. Please let us know when DC is planned.  Sanda Klein, MD, [facility] [phone] office [phone] pager

## 2019-05-12 NOTE — Progress Notes (Signed)
Called about pt with red MEWS. Per RN not an acute change and Md paged. Pt HR elevated and about to receive scheduled metoprolol. RN stated she did not need me to see pt at this time. Will follow up to see if MEWS and HR are improved.  Vital Signs MEWS/VS Documentation      05/12/2019 2035 05/12/2019 2056 05/12/2019 2111 05/12/2019 2130   MEWS Score:  4  5  5  4    MEWS Score Color:  Red  Red  Red  Red   Resp:  (!) 28  (!) 32  (!) 32  (!) 29   Pulse:  (!) 128  (!) 137  (!) 131  (!) 132   BP:  (!) 157/72  (!) 149/98  121/83  (!) 150/87   Temp:  98 F (36.7 C)  -  -  -   O2 Device:  -  Nasal Cannula  Nasal Cannula  Nasal Cannula   O2 Flow Rate (L/min):  -  2 L/min  2 L/min  2 L/min       MEWS Guidelines - (patients age 51 and over)  Red - At High Risk for Deterioration Yellow - At risk for Deterioration  1. Go to room and assess patient 2. Validate data. Is this patient's baseline? If data confirmed: 3. Is this an acute change? 4. Administer prn meds/treatments as ordered. 5. Note Sepsis score 6. Review goals of care 7. Sports coach, RRT nurse and Provider. 8. Ask Provider to come to bedside.  9. Document patient condition/interventions/response. 10. Increase frequency of vital signs and focused assessments to at least q15 minutes x 4, then q30 minutes x2. - If stable, then q1h x3, then q4h x3 and then q8h or dept. routine. - If unstable, contact Provider & RRT nurse. Prepare for possible transfer. 11. Add entry in progress notes using the smart phrase ".MEWS". 1. Go to room and assess patient 2. Validate data. Is this patient's baseline? If data confirmed: 3. Is this an acute change? 4. Administer prn meds/treatments as ordered? 5. Note Sepsis score 6. Review goals of care 7. Sports coach and Provider 8. Call RRT nurse as needed. 9. Document patient condition/interventions/response. 10. Increase frequency of vital signs and focused assessments to at least  q2h x2. - If stable, then q4h x2 and then q8h or dept. routine. - If unstable, contact Provider & RRT nurse. Prepare for possible transfer. 11. Add entry in progress notes using the smart phrase ".MEWS".  Green - Likely stable Lavender - Comfort Care Only  1. Continue routine/ordered monitoring.  2. Review goals of care. 1. Continue routine/ordered monitoring. 2. Review goals of care.        Sherilyn Dacosta 05/12/2019,10:04 PM

## 2019-05-12 NOTE — Progress Notes (Signed)
Patient ID: Dylan Goodman, male   DOB: 1951-07-25, 68 y.o.   MRN: [id number] S:No events overnight, still on 3 liters via [place] O:BP 138/89   Pulse 97   Temp 98.1 F (36.7 C) (Oral)   Resp (!) 21   Ht 5\' 11"  (1.803 m)   Wt 103 kg   SpO2 91%   BMI 31.67 kg/m   Intake/Output Summary (Last 24 hours) at 05/12/2019 1232 Last data filed at 05/12/2019 1212 Gross per 24 hour  Intake 904.2 ml  Output 1875 ml  Net -970.8 ml   Intake/Output: I/O last 3 completed shifts: In: 1521.9 [P.O.:720; I.V.:801.9] Out: 2100 [Urine:2100]  Intake/Output this shift:  Total I/O In: 67.5 [I.V.:67.5] Out: 750 [Urine:750] Weight change:  Physical exam: unable to complete due to COVID + status.In order to preserve PPE equipment and to minimize exposure to providers.Notes from other caregivers reviewed  Recent Labs  Lab 05/05/19 1832 05/06/19 1205  05/06/19 2052 05/07/19 0342  05/07/19 1439 05/07/19 1801 05/08/19 0004 05/08/19 0344 05/09/19 0510 05/10/19 0651 05/11/19 0602 05/12/19 0432  NA 138 141   < > 139 140   < > 141 141 138  --  139 140 138 140  K 3.8 4.1   < > 3.7 4.8   < > 4.7 4.3 4.0  --  3.5 3.6 3.4* 3.7  CL 105 112*  --  110 109  --  112*  --  110  --  105 103 103 103  CO2 11* 10*  --  14* 13*  --  15*  --  13*  --  20* 21* 23 24  GLUCOSE 198* 246*  --  146* 172*  --  141*  --  100*  --  168* 161* 126* 126*  BUN 44* 39*  --  42* 42*  --  44*  --  46*  --  48* 48* 49* 50*  CREATININE 3.06* 2.83*  --  3.63* 3.81*  --  4.62*  --  4.99*  --  5.17* 4.66* 4.42* 3.99*  ALBUMIN 3.1* 2.4*  --  2.4*  --   --   --   --   --  2.2* 2.1* 2.2* 2.3*  --   CALCIUM 7.1* 6.3*  --  6.2* 6.8*  --  6.5*  --  6.6*  --  7.3* 8.0* 8.2* 8.6*  PHOS  --   --   --  5.6* 5.8*  --  6.7*  --   --  5.2* 4.5 4.9*  --   --   AST 44* 628*  --  1,197*  --   --   --   --   --  164* 52*  --  36  --   ALT 22 329*  --  376*  --   --   --   --   --  187* 116*  --  80*  --    < > = values in this interval not  displayed.   Liver Function Tests: Recent Labs  Lab 05/08/19 0344 05/09/19 0510 05/10/19 0651 05/11/19 0602  AST 164* 52*  --  36  ALT 187* 116*  --  80*  ALKPHOS 61 56  --  71  BILITOT 1.0 0.9  --  1.0  PROT 5.2* 5.0*  --  5.4*  ALBUMIN 2.2* 2.1* 2.2* 2.3*   No results for input(s): LIPASE, AMYLASE in the last 168 hours. No results for input(s): AMMONIA in the last 168 hours.  CBC: Recent Labs  Lab 05/06/19 1205  05/07/19 0342  05/08/19 0344 05/09/19 0510 05/10/19 0651 05/12/19 0432  WBC 4.7   < > 9.5  --  8.8 8.3 9.2 10.7*  NEUTROABS 1.5*  --   --   --   --   --   --   --   HGB 11.5*   < > 12.3*   < > 10.0* 10.0* 11.0* 11.1*  HCT 40.4   < > 40.3   < > 33.3* 31.3* 34.0* 34.4*  MCV 100.2*   < > 96.6  --  96.8 91.5 91.2 93.5  PLT 244   < > 357  --  230 230 227 248   < > = values in this interval not displayed.   Cardiac Enzymes: No results for input(s): CKTOTAL, CKMB, CKMBINDEX, TROPONINI in the last 168 hours. CBG: Recent Labs  Lab 05/11/19 2046 05/11/19 2309 05/12/19 0358 05/12/19 0810 05/12/19 1120  GLUCAP 127* 196* 132* 112* 130*    Iron Studies: No results for input(s): IRON, TIBC, TRANSFERRIN, FERRITIN in the last 72 hours. Studies/Results: No results found. Marland Kitchen apixaban  5 mg Oral BID  . budesonide  1 puff Inhalation BID  . calcitRIOL  0.25 mcg Oral Daily  . Chlorhexidine Gluconate Cloth  6 each Topical Daily  . feeding supplement (GLUCERNA SHAKE)  237 mL Oral BID BM  . Ipratropium-Albuterol  1 puff Inhalation Q6H  . levETIRAcetam  500 mg Oral BID  . magnesium oxide  800 mg Oral Daily  . metoprolol tartrate  100 mg Oral BID  . pantoprazole  40 mg Oral Daily  . sodium chloride flush  3 mL Intravenous Q12H    BMET    Component Value Date/Time   NA 140 05/12/2019 0432   NA 138 03/25/2019   K 3.7 05/12/2019 0432   CL 103 05/12/2019 0432   CO2 24 05/12/2019 0432   GLUCOSE 126 (H) 05/12/2019 0432   BUN 50 (H) 05/12/2019 0432   BUN 43 (A)  03/25/2019   CREATININE 3.99 (H) 05/12/2019 0432   CREATININE 3.36 (H) 02/12/2019 1450   CALCIUM 8.6 (L) 05/12/2019 0432   GFRNONAA 15 (L) 05/12/2019 0432   GFRNONAA 18 (L) 02/12/2019 1450   GFRAA 17 (L) 05/12/2019 0432   GFRAA 21 (L) 02/12/2019 1450   CBC    Component Value Date/Time   WBC 10.7 (H) 05/12/2019 0432   RBC 3.68 (L) 05/12/2019 0432   HGB 11.1 (L) 05/12/2019 0432   HCT 34.4 (L) 05/12/2019 0432   PLT 248 05/12/2019 0432   MCV 93.5 05/12/2019 0432   MCH 30.2 05/12/2019 0432   MCHC 32.3 05/12/2019 0432   RDW 16.7 (H) 05/12/2019 0432   LYMPHSABS 2.4 05/06/2019 1205   MONOABS 0.4 05/06/2019 1205   EOSABS 0.0 05/06/2019 1205   BASOSABS 0.0 05/06/2019 1205     Assessment/Plan:  1. AKI/CKD- presumably due to ischemic ATN in setting of cardiac arrest with prolonged hypotension.  Cr continues to slowly improve and UOP has remained stable.  No indication for dialysis at this time.  Will continue to follow.  2. Metabolic acidosis- improved with IV bicarb. 3. HTN/volume- stable  4. Anemia- Hgb improving 5. CAD s/p CABG with CMP EF 35-40%- s/p cardiac arrest (VT) 6. Atrial fib with RVR- per Cardiology  Donetta Potts, MD [facility] [phone]

## 2019-05-12 NOTE — Progress Notes (Signed)
Physical Therapy Treatment/Vestibular Patient Details Name: Dylan Goodman MRN: [id number] DOB: [birth year] Today's Date: 05/12/2019    History of Present Illness Pt is a 68 yo male s/p recent hospitalization 9/13 for COVID+ PNA. THis admission due to dizziness, witnessed 2-minute tonic/clonic type seizure. COVID Test again positive. 10/7 when he abruptly developed respiratory distress and tachycardia requiring CPR. 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    PT Comments    Fairly benign vestibular assessment with only true difficulty is with gaze stability, so x1 exercises given.  Pt reports that his symptoms are most closely described as "lightheaded".  He was able to sit and stand EOB with me today, but unable to take steps away from the bed even with RW and support due to feeling weak.  He is open to the possibility of rehab at discharge.  PT will continue to follow acutely for safe mobility progression.  Review x 1 exercises next session and try finger probe on earlobe as finger probe was on forehead and we have found at [facility] that these, despite their great looking waveform tend to elevate O2 reading 5-6 points on average.  Pt does appear to have poor circulation in his fingers which will make finger probe reading difficult.     Follow Up Recommendations  CIR     Equipment Recommendations  None recommended by PT(pt reports he has a RW at home)    Recommendations for Other Services Rehab consult     Precautions / Restrictions Precautions Precautions: Fall;Other (comment) Precaution Comments: watch O2; dizziness with OOB    Mobility  Bed Mobility Overal bed mobility: Needs Assistance Bed Mobility: Supine to Sit;Sit to Supine     Supine to sit: Min assist;HOB elevated Sit to supine: Min assist;HOB elevated   General bed mobility comments: Min assist to  support trunk to come to sitting EOB.  Pt able to progress legs over the side of the bed and lift one of his two legs back into bed when returning to supine.   Transfers Overall transfer level: Needs assistance Equipment used: Rolling walker (2 wheeled) Transfers: Sit to/from Stand Sit to Stand: Min assist         General transfer comment: Min assist to stand to RW. blocked knees and elevated bed, however, pt came up without much assist.  He is fearful of dizziness and falling.  Stood EOB x2  Ambulation/Gait Ambulation/Gait assistance: Herbalist (Feet): 3 Feet Assistive device: Rolling walker (2 wheeled) Gait Pattern/deviations: Step-to pattern     General Gait Details: Pt marched in place and was able to side step to the [facility] with RW.  Reported some dizziness, but improved over last session.           Balance Overall balance assessment: Needs assistance Sitting-balance support: Feet supported;Bilateral upper extremity supported Sitting balance-Leahy Scale: Fair Sitting balance - Comments: close supervision EOB, tendancy to posterior tilt until his feet are on the ground.  Postural control: Posterior lean Standing balance support: Bilateral upper extremity supported Standing balance-Leahy Scale: Poor Standing balance comment: needs external support from RW.              05/12/19 1827  Vestibular Assessment  General Observation No reports of head injury, whiplash, fullness, tinnitus, change in meds, no double or blurry vision, wears glasses to read, has had eye surgery bil, no h/o strabismus,  no recent antibiotic use, he does currently have COVID viral infection, orthostatics were checked last session and were negative, on 0.5 L O2, HR up 110s-129 during mobility. Only symptoms were with gaze stability testing and coming to sitting, once standing.  Described as "lightheaded"  Symptom Behavior  Subjective history of current problem lightheadedness when  upright,   Type of Dizziness  Imbalance;Lightheadedness  Frequency of Dizziness intermittent  Duration of Dizziness short  Symptom Nature Positional;Motion provoked;Intermittent  Aggravating Factors Activity in general;Supine to sit;Sit to stand  Relieving Factors Head stationary;Slow movements  Progression of Symptoms Better  History of similar episodes yes, a few years ago, lasted 6 weeks had to use a rolling chair to get around his home.   Oculomotor Exam  Oculomotor Alignment Normal  Spontaneous Absent  Gaze-induced  Comment (very slight gaze induced diff to say direction)  Smooth Pursuits Intact  Vestibulo-Ocular Reflex  VOR 1 Head Only (x 1 viewing) symptomatic both horizontally and vertically  Auditory  Comments HOH at baseline, wears hearing aids (does not have them here).   Positional Testing  Horizontal Canal Testing Horizontal Canal Right;Horizontal Canal Left  Horizontal Canal Right  Horizontal Canal Right Duration 0  Horizontal Canal Right Symptoms Normal  Horizontal Canal Left  Horizontal Canal Left Duration 0  Horizontal Canal Left Symptoms Normal   05/12/2019 Pt did not report dizziness with transition back to supine (semi-Dix Hallpike like), nor did he have symptoms flat or in trendelenburg to scoot to [facility], so [employer] not preformed.  Often I will see fragments of symptoms with horizontal canal testing if they have a posterior canal BPPV.  His symptoms more closely match either a vestibular neuritis (possibly brought on by the virus) or something cardiovascular (HR, BP, O2, MI).                    Cognition Arousal/Alertness: Awake/alert Behavior During Therapy: WFL for tasks assessed/performed Overall Cognitive Status: Within Functional Limits for tasks assessed                                 General Comments: Not specifically tested      Exercises General Exercises - Upper Extremity Shoulder Flexion: AROM;Both;10 reps(to 90 degrees  due to pain above 90) Elbow Flexion: AROM;Both;10 reps General Exercises - Lower Extremity Long Arc Quad: AROM;Both;10 reps Hip Flexion/Marching: AROM;Both;10 reps Toe Raises: AROM;Both;10 reps Heel Raises: AROM;Both;10 reps Other Exercises Other Exercises: encouraged x1 vertical and horizontal exercises one each every hour after he does his incentive spirometer to help with his dizziness as this was the only thing I could tease out with vestibular testing (gaze stability issues).     General Comments General comments (skin integrity, edema, etc.): Lowest O2 sat reading was 83% on 0.5 L O2 [place].  Pt with DOE 3/4 during mobility.  question accuracy of O2 sat due to finger probe used on forehead (tends to falsely elevate O2 reading despite good waveform), however, he has poor looking circulation in his fingers.  I would try pediatric finger probe on his earlobe next session as [employer] forehead probes and corresponding monitors are not readily available here.  Highest HR was 129 during EOB activity.       Pertinent Vitals/Pain Pain Assessment: Faces Faces Pain Scale: Hurts little more Pain Location: chest Pain Descriptors / Indicators: Sore Pain Intervention(s): Limited activity within patient's tolerance;Monitored during session;Repositioned  PT Goals (current goals can now be found in the care plan section) Acute Rehab PT Goals Patient Stated Goal: to get back home and fish Progress towards PT goals: Not progressing toward goals - comment(became acutely dizzy over the past few days)    Frequency    Min 3X/week      PT Plan Discharge plan needs to be updated       AM-PAC PT "6 Clicks" Mobility   Outcome Measure  Help needed turning from your back to your side while in a flat bed without using bedrails?: A Little Help needed moving from lying on your back to sitting on the side of a flat bed without using bedrails?: A Little Help needed moving to and from a bed to a  chair (including a wheelchair)?: A Lot Help needed standing up from a chair using your arms (e.g., wheelchair or bedside chair)?: A Little Help needed to walk in hospital room?: A Lot Help needed climbing 3-5 steps with a railing? : A Lot 6 Click Score: 15    End of Session Equipment Utilized During Treatment: Oxygen(0.5 L O2 [place]) Activity Tolerance: Patient limited by fatigue Patient left: in bed;with call bell/phone within reach;with chair alarm set Nurse Communication: Mobility status PT Visit Diagnosis: Unsteadiness on feet (R26.81);Muscle weakness (generalized) (M62.81);Dizziness and giddiness (R42)     Time: 4970-2637 PT Time Calculation (min) (ACUTE ONLY): 60 min  Charges:  $Therapeutic Exercise: 23-37 mins $Therapeutic Activity: 23-37 mins             Aalaysia Liggins B. Jaselyn Nahm, PT, DPT  Acute Rehabilitation [phone] pager [phone] office  @ [facility]: [phone]            05/12/2019, 6:32 PM

## 2019-05-12 NOTE — Progress Notes (Addendum)
PROGRESS NOTE    Dylan Goodman  HBZ:[id number] DOB: [birth year] DOA: 05/06/2019 PCP: Steele Sizer, MD   Brief Narrative:  Patient is a 68 year old male with history of atrial fibrillation, GI bleed, coronary artery disease status post CABG, nasopharyngeal cancer, cardiomyopathy, chronic depression, CKD stage 4, diabetes mellitus, GI bleed , peripheral vascular disease, previous smoker, recent Covid-19 infection about 3 weeks ago before admission who presented to [facility] with seizure.  He sustained a cardiac arrest there in the emergency department after an seizure episode.  Due to Covid positivity he was sent to [facility] and was admitted under PCCM service.  He was defibrillated after cardiac arrest.  Intubated for airway protection.  Currently extubated and maintaining his saturation on nasal cannula.  Patient transferred to [facility] service on 05/08/19.  Hospital course remarkable for development of AKI on CKD,Afib with RVR.Cardiology,nephrology following. His overall status is improving.  He Might need 1 or 2 more days before discharge.  Assessment & Plan:   Active Problems:   Unspecified atrial fibrillation (HCC)   Acute respiratory failure with hypoxemia (HCC)   Cardiac arrest (HCC)   Prolonged QT interval   Status post cardiac arrest: Initial rhythm was reported to be VT with conversion to A. fib RVR so he was defibrillated.  Downtime estimated  at 11 minutes.  Cardiology following.  Currently hemodynamically stable. Echocardiogram done on 05/08/2019 showed ejection fraction of 40- 45% showing improvement on comparison to his last echo.  A. fib with RVR: Hospital course remarkable for Afib with RVR  .  Cardiology following.  On Eliquis at home for anticoagulation which was held due to acute kidney injury.  Started on lopressor with better heart rate control.  Eliquis restarted today.  AKI on CKD stage 4: Creatinine trended down today and is in range of 3 . Baseline creatinine around  2.5-3. AKI most likely secondary to ATN due to  hypotension secondary to cardiac arrest.  Also had metabolic acidosis.  Started on bicarb and sterile water but now stopped. Patient voiding well.Nephrology following.  Acute hypoxic respiratory failure: Intubated for airway protection.  Currently on oxygen at 2 to 3 L/min.  Not on oxygen at home.  Chest x-ray on 05/07/2019 showed stable bibasilar opacities.today he was on 3 L of oxygen.  Continue to try weaning oxygen.I have discussed with RN about it.  COVID-19 infection: Diagnosed with COVID about 3 weeks ago.  Covid test on 05/04/2019 was positive. No COVID specific treatment needed.  Continue supportive care.  On airborne precaution.  Hypotension: Required Levophed drip for maintenance of blood pressure.  Currently off drip.  Maintaining blood pressure.  Elevated liver enzymes: Most likely secondary to shock liver.  Significantly improved now.  Seizure: Presented initially with seizure.  Had an seizure episode in the emergency department.  Started on Keppra.  Neurology was following.  Needs follow-up with neurology on discharge.  Debility/deconditioning: Patient evaluated by physical therapy and recommended home health on discharge.PT/OT needs to follow up on him  Hypokalemia/hypomagnesemia: Supplemented    Nutrition Problem: Increased nutrient needs Etiology: acute illness      DVT prophylaxis: Eliquis  Code Status: Full Family Communication: Called wife on phone on 05/12/19 Disposition Plan: He might need 24 to 48 hours more to decide on stability to discharge to home.  His heart rate was fluctuating until yesterday.  He is still on oxygen at 3 L which we need to taper down.Also needs cardiology and nephrology clearance for discharge   Consultants:  PCCM,cardiology  Procedures: Intubation/extubation  Antimicrobials:  Anti-infectives (From admission, onward)   Start     Dose/Rate Route Frequency Ordered Stop   05/09/19 1000   piperacillin-tazobactam (ZOSYN) IVPB 2.25 g  Status:  Discontinued     2.25 g 100 mL/hr over 30 Minutes Intravenous Every 8 hours 05/09/19 0833 05/10/19 1346   05/08/19 1000  piperacillin-tazobactam (ZOSYN) IVPB 3.375 g  Status:  Discontinued     3.375 g 12.5 mL/hr over 240 Minutes Intravenous Every 12 hours 05/08/19 0930 05/09/19 0833   05/08/19 0630  piperacillin-tazobactam (ZOSYN) IVPB 3.375 g  Status:  Discontinued     3.375 g 12.5 mL/hr over 240 Minutes Intravenous Every 12 hours 05/07/19 2040 05/08/19 0930   05/07/19 2200  vancomycin (VANCOCIN) 1,750 mg in sodium chloride 0.9 % 500 mL IVPB  Status:  Discontinued     1,750 mg 250 mL/hr over 120 Minutes Intravenous Every 48 hours 05/06/19 1912 05/07/19 1039   05/07/19 2200  vancomycin (VANCOCIN) 1,500 mg in sodium chloride 0.9 % 500 mL IVPB  Status:  Discontinued     1,500 mg 250 mL/hr over 120 Minutes Intravenous Every 48 hours 05/07/19 1039 05/07/19 1228   05/07/19 0200  piperacillin-tazobactam (ZOSYN) IVPB 3.375 g  Status:  Discontinued     3.375 g 12.5 mL/hr over 240 Minutes Intravenous Every 8 hours 05/06/19 1853 05/07/19 2040   05/06/19 1930  piperacillin-tazobactam (ZOSYN) IVPB 3.375 g     3.375 g 100 mL/hr over 30 Minutes Intravenous  Once 05/06/19 1853 05/06/19 2156      Subjective:   Patient seen and examined the bedside this morning.  Currently hemodynamically stable.  Heart rate better controlled today.  Respiratory status looks stable but is still requiring 3 L of oxygen per minute.  Cough is better.  Denies any shortness of breath or chest pain.he is hopeful to go home soon.  Objective: Vitals:   05/12/19 0625 05/12/19 0630 05/12/19 0812 05/12/19 0955  BP:   (!) 144/88   Pulse:   87 75  Resp: (!) 22 20 (!) 22 13  Temp:   97.9 F (36.6 C)   TempSrc:   Oral   SpO2:   98% 92%  Weight:      Height:        Intake/Output Summary (Last 24 hours) at 05/12/2019 1151 Last data filed at 05/12/2019 1000 Gross per 24  hour  Intake 904.2 ml  Output 1625 ml  Net -720.8 ml   Filed Weights   05/09/19 0422 05/09/19 2324 05/12/19 0500  Weight: 102.7 kg 104.8 kg 103 kg    Examination:     General exam: Not in distress, pleasant gentleman, appears older than age HEENT:PERRL,Oral mucosa moist, Ear/Nose normal on gross exam Respiratory system: Bilateral diminished breath sounds on the bases Cardiovascular system: A. fib. No JVD, murmurs, rubs, gallops or clicks. Gastrointestinal system: Abdomen is nondistended, soft and nontender. No organomegaly or masses felt. Normal bowel sounds heard. Central nervous system: Alert and oriented. No focal neurological deficits. Extremities: No edema, no clubbing ,no cyanosis, distal peripheral pulses palpable. Skin: No rashes, lesions or ulcers,no icterus ,no pallor       Data Reviewed: I have personally reviewed following labs and imaging studies  CBC: Recent Labs  Lab 05/06/19 1205  05/07/19 0342  05/07/19 1801 05/08/19 0344 05/09/19 0510 05/10/19 0651 05/12/19 0432  WBC 4.7   < > 9.5  --   --  8.8 8.3 9.2 10.7*  NEUTROABS 1.5*  --   --   --   --   --   --   --   --  HGB 11.5*   < > 12.3*   < > 9.9* 10.0* 10.0* 11.0* 11.1*  HCT 40.4   < > 40.3   < > 29.0* 33.3* 31.3* 34.0* 34.4*  MCV 100.2*   < > 96.6  --   --  96.8 91.5 91.2 93.5  PLT 244   < > 357  --   --  230 230 227 248   < > = values in this interval not displayed.   Basic Metabolic Panel: Recent Labs  Lab 05/07/19 0342  05/07/19 1439  05/08/19 0004 05/08/19 0344 05/09/19 0510 05/10/19 0651 05/11/19 0602 05/12/19 0432  NA 140   < > 141   < > 138  --  139 140 138 140  K 4.8   < > 4.7   < > 4.0  --  3.5 3.6 3.4* 3.7  CL 109  --  112*  --  110  --  105 103 103 103  CO2 13*  --  15*  --  13*  --  20* 21* 23 24  GLUCOSE 172*  --  141*  --  100*  --  168* 161* 126* 126*  BUN 42*  --  44*  --  46*  --  48* 48* 49* 50*  CREATININE 3.81*  --  4.62*  --  4.99*  --  5.17* 4.66* 4.42* 3.99*   CALCIUM 6.8*  --  6.5*  --  6.6*  --  7.3* 8.0* 8.2* 8.6*  MG 2.2  --  2.2  --   --  1.7 1.5* 1.5* 1.4* 1.2*  PHOS 5.8*  --  6.7*  --   --  5.2* 4.5 4.9*  --   --    < > = values in this interval not displayed.   GFR: Estimated Creatinine Clearance: 22.6 mL/min (A) (by C-G formula based on SCr of 3.99 mg/dL (H)). Liver Function Tests: Recent Labs  Lab 05/06/19 1205 05/06/19 2052 05/08/19 0344 05/09/19 0510 05/10/19 0651 05/11/19 0602  AST 628* 1,197* 164* 52*  --  36  ALT 329* 376* 187* 116*  --  80*  ALKPHOS 61 91 61 56  --  71  BILITOT 0.6 0.9 1.0 0.9  --  1.0  PROT 5.3* 5.5* 5.2* 5.0*  --  5.4*  ALBUMIN 2.4* 2.4* 2.2* 2.1* 2.2* 2.3*   No results for input(s): LIPASE, AMYLASE in the last 168 hours. No results for input(s): AMMONIA in the last 168 hours. Coagulation Profile: Recent Labs  Lab 05/05/19 2114 05/06/19 2052 05/08/19 0344  INR 1.4* 1.5* 1.5*   Cardiac Enzymes: No results for input(s): CKTOTAL, CKMB, CKMBINDEX, TROPONINI in the last 168 hours. BNP (last 3 results) No results for input(s): PROBNP in the last 8760 hours. HbA1C: No results for input(s): HGBA1C in the last 72 hours. CBG: Recent Labs  Lab 05/11/19 2046 05/11/19 2309 05/12/19 0358 05/12/19 0810 05/12/19 1120  GLUCAP 127* 196* 132* 112* 130*   Lipid Profile: No results for input(s): CHOL, HDL, LDLCALC, TRIG, CHOLHDL, LDLDIRECT in the last 72 hours. Thyroid Function Tests: No results for input(s): TSH, T4TOTAL, FREET4, T3FREE, THYROIDAB in the last 72 hours. Anemia Panel: No results for input(s): VITAMINB12, FOLATE, FERRITIN, TIBC, IRON, RETICCTPCT in the last 72 hours. Sepsis Labs: Recent Labs  Lab 05/05/19 2114 05/06/19 2052 05/07/19 0020  PROCALCITON 0.14 14.10  --   LATICACIDVEN 1.2 1.1 0.9    Recent Results (from the past 240 hour(s))  SARS Coronavirus 2 [facility] order,  Performed in [facility] hospital lab) Nasopharyngeal Nasopharyngeal Swab     Status: Abnormal    Collection Time: 05/04/19 11:25 AM   Specimen: Nasopharyngeal Swab  Result Value Ref Range Status   SARS Coronavirus 2 POSITIVE (A) NEGATIVE Final    Comment: RESULT CALLED TO, READ BACK BY AND VERIFIED WITH: ASHLEY MAY AT 1248 ON 05/04/2019 [place]. (NOTE) If result is NEGATIVE SARS-CoV-2 target nucleic acids are NOT DETECTED. The SARS-CoV-2 RNA is generally detectable in upper and lower  respiratory specimens during the acute phase of infection. The lowest  concentration of SARS-CoV-2 viral copies this assay can detect is 250  copies / mL. A negative result does not preclude SARS-CoV-2 infection  and should not be used as the sole basis for treatment or other  patient management decisions.  A negative result may occur with  improper specimen collection / handling, submission of specimen other  than nasopharyngeal swab, presence of viral mutation(s) within the  areas targeted by this assay, and inadequate number of viral copies  (<250 copies / mL). A negative result must be combined with clinical  observations, patient history, and epidemiological information. If result is POSITIVE SARS-CoV-2 target nucleic acids are DETECTED.  The SARS-CoV-2 RNA is generally detectable in upper and lower  respiratory specimens during the acute phase of infection.  Positive  results are indicative of active infection with SARS-CoV-2.  Clinical  correlation with patient history and other diagnostic information is  necessary to determine patient infection status.  Positive results do  not rule out bacterial infection or co-infection with other viruses. If result is PRESUMPTIVE POSTIVE SARS-CoV-2 nucleic acids MAY BE PRESENT.   A presumptive positive result was obtained on the submitted specimen  and confirmed on repeat testing.  While 2019 novel coronavirus  (SARS-CoV-2) nucleic acids may be present in the submitted sample  additional confirmatory testing may be necessary for epidemiological  and / or  clinical management purposes  to differentiate between  SARS-CoV-2 and other Sarbecovirus currently known to infect humans.  If clinically indicated additional testing with an alternate test  methodology [phone]) i s advised. The SARS-CoV-2 RNA is generally  detectable in upper and lower respiratory specimens during the acute  phase of infection. The expected result is Negative. Fact Sheet for Patients:  StrictlyIdeas.no Fact Sheet for Healthcare Providers: BankingDealers.co.za This test is not yet approved or cleared by the [place] FDA and has been authorized for detection and/or diagnosis of SARS-CoV-2 by FDA under an Emergency Use Authorization (EUA).  This EUA will remain in effect (meaning this test can be used) for the duration of the COVID-19 declaration under Section 564(b)(1) of the Act, 21 U.S.C. section 360bbb-3(b)(1), unless the authorization is terminated or revoked sooner. Performed at [facility], [address] [place], [place] [address]   Blood Culture (routine x 2)     Status: None   Collection Time: 05/05/19  9:14 PM   Specimen: BLOOD  Result Value Ref Range Status   Specimen Description BLOOD LEFT ANTECUBITAL  Final   Special Requests   Final    BOTTLES DRAWN AEROBIC AND ANAEROBIC Blood Culture results may not be optimal due to an excessive volume of blood received in culture bottles   Culture   Final    NO GROWTH 5 DAYS Performed at [facility], [address], [place], [place] [address]    Report Status 05/10/2019 FINAL  Final  Blood Culture (routine x 2)     Status:  None   Collection Time: 05/05/19  9:40 PM   Specimen: BLOOD  Result Value Ref Range Status   Specimen Description BLOOD BLOOD RIGHT WRIST  Final   Special Requests   Final    BOTTLES DRAWN AEROBIC AND ANAEROBIC Blood Culture adequate volume   Culture   Final    NO GROWTH 5 DAYS Performed at [facility],  [address] [place], [place] [address]    Report Status 05/10/2019 FINAL  Final  MRSA PCR Screening     Status: None   Collection Time: 05/06/19  5:31 PM   Specimen: Nasal Mucosa; Nasopharyngeal  Result Value Ref Range Status   MRSA by PCR NEGATIVE NEGATIVE Final    Comment:        The GeneXpert MRSA Assay (FDA approved for NASAL specimens only), is one component of a comprehensive MRSA colonization surveillance program. It is not intended to diagnose MRSA infection nor to guide or monitor treatment for MRSA infections. Performed at [facility], [place] [address] [place], [place] [address]   Culture, blood (routine x 2)     Status: None   Collection Time: 05/06/19  8:10 PM   Specimen: BLOOD RIGHT HAND  Result Value Ref Range Status   Specimen Description BLOOD RIGHT HAND  Final   Special Requests   Final    BOTTLES DRAWN AEROBIC AND ANAEROBIC Blood Culture results may not be optimal due to an inadequate volume of blood received in culture bottles   Culture   Final    NO GROWTH 5 DAYS Performed at [facility], [place] [address], [place], [place] [address]    Report Status 05/11/2019 FINAL  Final  Culture, blood (routine x 2)     Status: None   Collection Time: 05/06/19  8:33 PM   Specimen: BLOOD LEFT HAND  Result Value Ref Range Status   Specimen Description BLOOD LEFT HAND  Final   Special Requests   Final    AEROBIC BOTTLE ONLY Blood Culture results may not be optimal due to an inadequate volume of blood received in culture bottles   Culture   Final    NO GROWTH 5 DAYS Performed at [facility], [place] [address], [place], [place] [address]    Report Status 05/11/2019 FINAL  Final  Urine culture     Status: None   Collection Time: 05/06/19  9:17 PM   Specimen: Urine, Random  Result Value Ref Range Status   Specimen Description URINE, RANDOM  Final   Special Requests NONE  Final   Culture   Final    NO GROWTH Performed at [facility],  [place] [address] [place], [place] [address]    Report Status 05/07/2019 FINAL  Final  Culture, respiratory (tracheal aspirate)     Status: None   Collection Time: 05/07/19 10:24 AM   Specimen: Tracheal Aspirate; Respiratory  Result Value Ref Range Status   Specimen Description TRACHEAL ASPIRATE  Final   Special Requests NONE  Final   Gram Stain NO WBC SEEN RARE BUDDING YEAST SEEN   Final   Culture   Final    Consistent with normal respiratory flora. Performed at [facility], [place] [address] [place], [place] [address]    Report Status 05/09/2019 FINAL  Final         Radiology Studies: No results found.      Scheduled Meds: . apixaban  5 mg Oral BID  .  budesonide  1 puff Inhalation BID  . calcitRIOL  0.25 mcg Oral Daily  . Chlorhexidine Gluconate Cloth  6 each Topical Daily  . feeding supplement (GLUCERNA SHAKE)  237 mL Oral BID BM  . Ipratropium-Albuterol  1 puff Inhalation Q6H  . levETIRAcetam  500 mg Oral BID  . magnesium oxide  800 mg Oral Daily  . metoprolol tartrate  100 mg Oral BID  . pantoprazole  40 mg Oral Daily  . sodium chloride flush  3 mL Intravenous Q12H   Continuous Infusions: . sodium chloride       LOS: 6 days    Time spent: 35 mins.More than 50% of that time was spent in counseling and/or coordination of care.      Shelly Coss, MD [facility] Pager [phone]  If 7PM-7AM, please contact night-coverage [email or web address] Password [email or web address] 05/12/2019, 11:51 AM

## 2019-05-12 NOTE — Progress Notes (Signed)
   Vital Signs MEWS/VS Documentation      05/12/2019 1542 05/12/2019 1815 05/12/2019 1955 05/12/2019 2035   MEWS Score:  1  2  2  4    MEWS Score Color:  Green  Yellow  Yellow  Red   Resp:  20  -  -  (!) 28   Pulse:  75  (!) 129  -  (!) 128   BP:  (!) 143/92  -  -  (!) 157/72   Temp:  98.6 F (37 C)  -  -  98 F (36.7 C)   O2 Device:  -  Nasal Cannula  -  -   O2 Flow Rate (L/min):  -  0.5 L/min  -  -   Level of Consciousness:  -  -  Alert  -       Pt is currently stable, and eating. MD and rapid response nurse notified. Pt will receive metoprolol. Will continue to monitor     [place] 05/12/2019,8:38 PM

## 2019-05-12 NOTE — Progress Notes (Signed)
Rehab Admissions Coordinator Note:  Patient was screened by Michel Santee for appropriateness for an Inpatient Acute Rehab Consult.  At this time, we are recommending Inpatient Rehab consult.  Note that pt's most recent (+) test was on 10/7, with initial positive result on 9/13.  Per CIR guidelines, pt must have 2 negative tests OR be >20 days from symptom onset with recovery/improvement in symptoms prior to admission.   Michel Santee 05/12/2019, 7:59 PM  I can be reached at [phone].

## 2019-05-13 ENCOUNTER — Inpatient Hospital Stay (HOSPITAL_COMMUNITY): Payer: Medicare Other

## 2019-05-13 DIAGNOSIS — I4819 Other persistent atrial fibrillation: Secondary | ICD-10-CM | POA: Diagnosis not present

## 2019-05-13 DIAGNOSIS — E872 Acidosis: Secondary | ICD-10-CM

## 2019-05-13 DIAGNOSIS — R569 Unspecified convulsions: Secondary | ICD-10-CM

## 2019-05-13 LAB — CBC WITH DIFFERENTIAL/PLATELET
Abs Immature Granulocytes: 0.49 10*3/uL — ABNORMAL HIGH (ref 0.00–0.07)
Basophils Absolute: 0.1 10*3/uL (ref 0.0–0.1)
Basophils Relative: 1 %
Eosinophils Absolute: 0.3 10*3/uL (ref 0.0–0.5)
Eosinophils Relative: 2 %
HCT: 40 % (ref 39.0–52.0)
Hemoglobin: 12.3 g/dL — ABNORMAL LOW (ref 13.0–17.0)
Immature Granulocytes: 4 %
Lymphocytes Relative: 12 %
Lymphs Abs: 1.7 10*3/uL (ref 0.7–4.0)
MCH: 29.6 pg (ref 26.0–34.0)
MCHC: 30.8 g/dL (ref 30.0–36.0)
MCV: 96.4 fL (ref 80.0–100.0)
Monocytes Absolute: 1.5 10*3/uL — ABNORMAL HIGH (ref 0.1–1.0)
Monocytes Relative: 10 %
Neutro Abs: 9.9 10*3/uL — ABNORMAL HIGH (ref 1.7–7.7)
Neutrophils Relative %: 71 %
Platelets: 270 10*3/uL (ref 150–400)
RBC: 4.15 MIL/uL — ABNORMAL LOW (ref 4.22–5.81)
RDW: 17.8 % — ABNORMAL HIGH (ref 11.5–15.5)
WBC: 13.9 10*3/uL — ABNORMAL HIGH (ref 4.0–10.5)
nRBC: 1.1 % — ABNORMAL HIGH (ref 0.0–0.2)

## 2019-05-13 LAB — BASIC METABOLIC PANEL
Anion gap: 15 (ref 5–15)
BUN: 53 mg/dL — ABNORMAL HIGH (ref 8–23)
CO2: 20 mmol/L — ABNORMAL LOW (ref 22–32)
Calcium: 8.9 mg/dL (ref 8.9–10.3)
Chloride: 105 mmol/L (ref 98–111)
Creatinine, Ser: 3.69 mg/dL — ABNORMAL HIGH (ref 0.61–1.24)
GFR calc Af Amer: 19 mL/min — ABNORMAL LOW (ref >60)
GFR calc non Af Amer: 16 mL/min — ABNORMAL LOW (ref >60)
Glucose, Bld: 140 mg/dL — ABNORMAL HIGH (ref 70–99)
Potassium: 4.9 mmol/L (ref 3.5–5.1)
Sodium: 140 mmol/L (ref 135–145)

## 2019-05-13 LAB — GLUCOSE, CAPILLARY
Glucose-Capillary: 130 mg/dL — ABNORMAL HIGH (ref 70–99)
Glucose-Capillary: 143 mg/dL — ABNORMAL HIGH (ref 70–99)
Glucose-Capillary: 147 mg/dL — ABNORMAL HIGH (ref 70–99)
Glucose-Capillary: 146 mg/dL — ABNORMAL HIGH (ref 70–99)
Glucose-Capillary: 138 mg/dL — ABNORMAL HIGH (ref 70–99)

## 2019-05-13 LAB — MAGNESIUM: Magnesium: 1.3 mg/dL — ABNORMAL LOW (ref 1.7–2.4)

## 2019-05-13 IMAGING — DX DG CHEST 1V PORT
1 series · 1 of 1 positions shown · non-contrast
Comparison: [DATE]

CLINICAL DATA: Hypoxia

EXAM:
PORTABLE CHEST 1 VIEW

[chest ap]
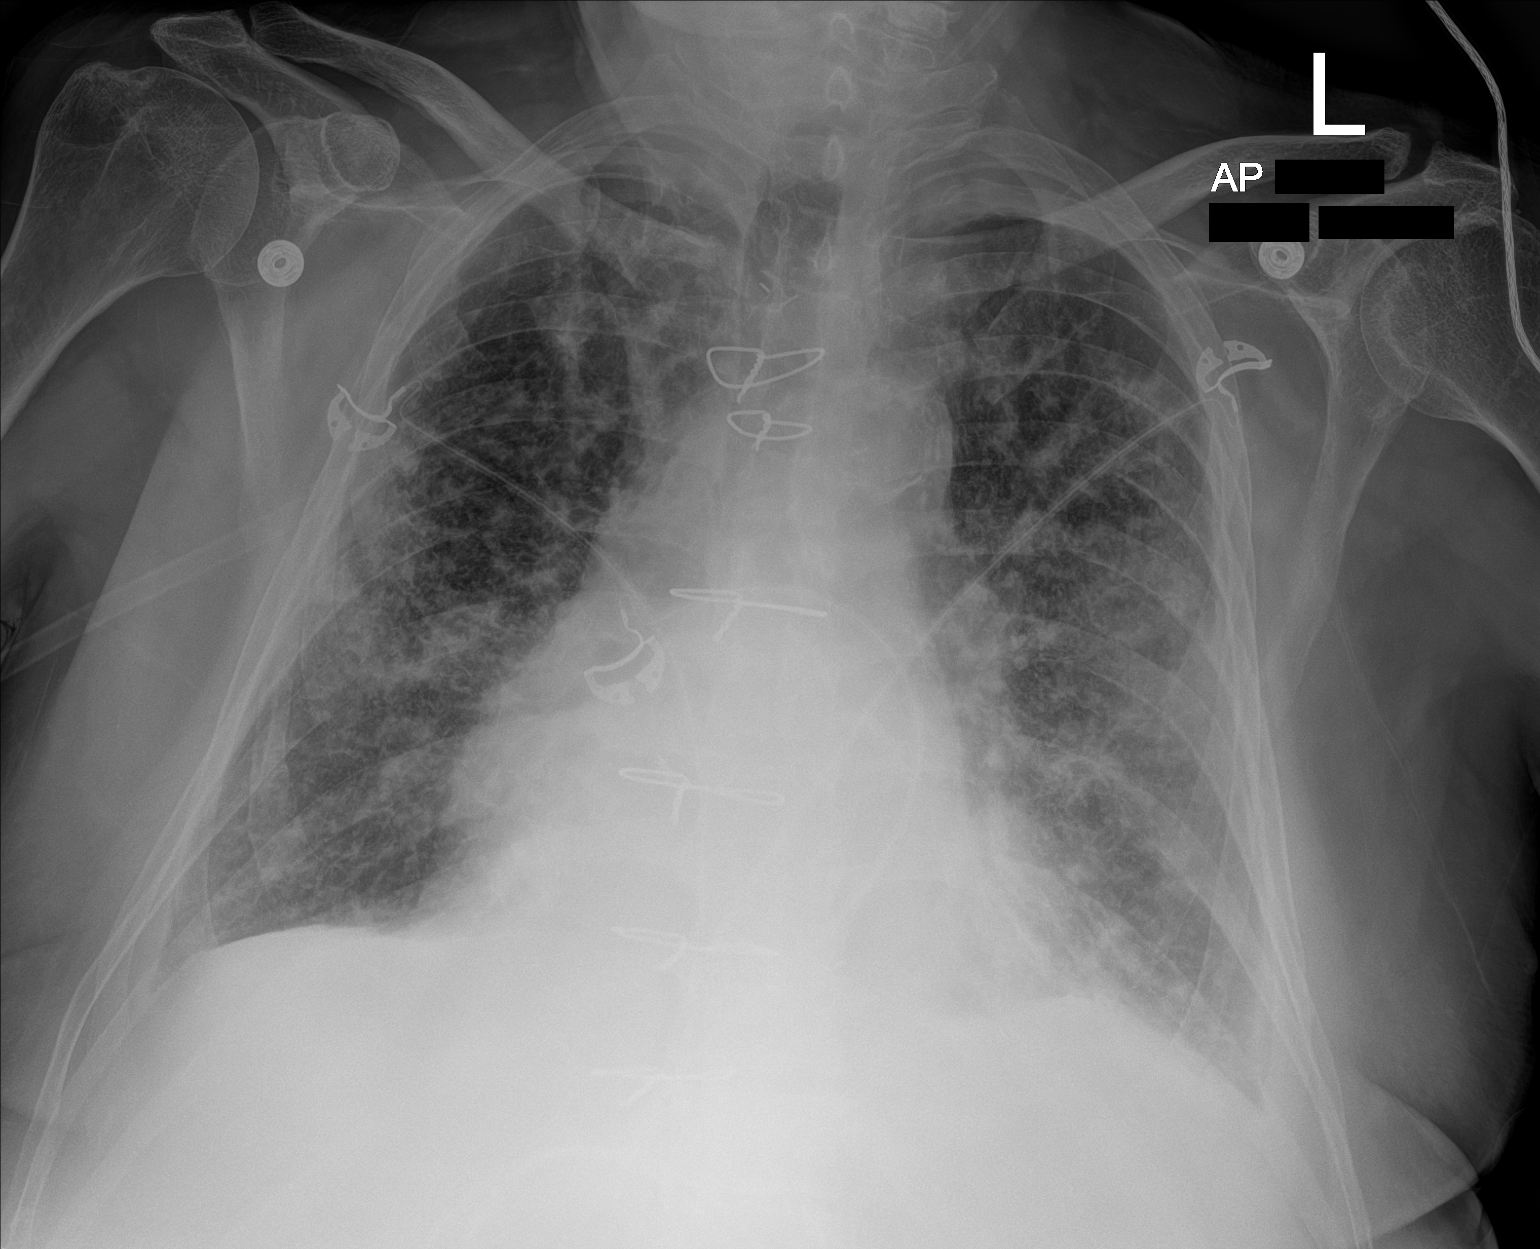

[1 of 1 positions shown; findings below may reference images not displayed]

FINDINGS: Interval extubation. Patchy bilateral airspace disease slightly
increased since prior study. Heart is borderline in size. Small
bilateral pleural effusions.
IMPRESSION: Worsening patchy bilateral airspace opacities concerning for
pneumonia. Small bilateral effusions.

## 2019-05-13 MED ORDER — LORAZEPAM 0.5 MG PO TABS
0.5000 mg | ORAL_TABLET | Freq: Two times a day (BID) | ORAL | Status: DC | PRN
  Administered 2019-05-13: 0.5 mg via ORAL
  Filled 2019-05-13: qty 1

## 2019-05-13 MED ORDER — DILTIAZEM HCL 60 MG PO TABS
60.0000 mg | ORAL_TABLET | Freq: Three times a day (TID) | ORAL | Status: DC
  Administered 2019-05-13 – 2019-05-14 (×4): 60 mg via ORAL
  Filled 2019-05-13 (×4): qty 1

## 2019-05-13 MED ORDER — MAGNESIUM SULFATE 4 GM/100ML IV SOLN
4.0000 g | Freq: Once | INTRAVENOUS | Status: AC
  Administered 2019-05-13: 4 g via INTRAVENOUS
  Filled 2019-05-13: qty 100

## 2019-05-13 NOTE — Progress Notes (Signed)
Despite increasing metoprolol to his usual dose, developed RVR after we stopped the diltiazem. Restarted diltiazem at 60 mg Q8h. So far seems to be controlling the rate without hypotension. Can switch to diltiazem 180 mg SR once daily in AM if continues to work well.  Sanda Klein, MD, [facility] [phone] office [phone] pager

## 2019-05-13 NOTE — Progress Notes (Signed)
Patient ID: Dylan Goodman, male   DOB: Dec 04, 1950, 68 y.o.   MRN: [id number] S: developed a fib with RVR after diltiazem was stopped but no hypotension O:BP (!) 168/93 (BP Location: Right Arm)   Pulse (!) 118   Temp 97.7 F (36.5 C) (Axillary)   Resp (!) 28   Ht 5\' 11"  (1.803 m)   Wt 103 kg   SpO2 92%   BMI 31.67 kg/m   Intake/Output Summary (Last 24 hours) at 05/13/2019 1402 Last data filed at 05/13/2019 1000 Gross per 24 hour  Intake 637.08 ml  Output 1370 ml  Net -732.92 ml   Intake/Output: I/O last 3 completed shifts: In: 1058.2 [P.O.:480; I.V.:559.8; IV Piggyback:18.4] Out: 2775 [Urine:2775]  Intake/Output this shift:  Total I/O In: 20.8 [IV Piggyback:20.8] Out: 470 [Urine:470] Weight change:  Physical exam: unable to complete due to COVID + status.In order to preserve PPE equipment and to minimize exposure to providers.Notes from other caregivers reviewed  Recent Labs  Lab 05/06/19 2052 05/07/19 0342  05/07/19 1439 05/07/19 1801 05/08/19 0004 05/08/19 0344 05/09/19 0510 05/10/19 3716 05/11/19 0602 05/12/19 0432 05/13/19 0558  NA 139 140   < > 141 141 138  --  139 140 138 140 140  K 3.7 4.8   < > 4.7 4.3 4.0  --  3.5 3.6 3.4* 3.7 4.9  CL 110 109  --  112*  --  110  --  105 103 103 103 105  CO2 14* 13*  --  15*  --  13*  --  20* 21* 23 24 20*  GLUCOSE 146* 172*  --  141*  --  100*  --  168* 161* 126* 126* 140*  BUN 42* 42*  --  44*  --  46*  --  48* 48* 49* 50* 53*  CREATININE 3.63* 3.81*  --  4.62*  --  4.99*  --  5.17* 4.66* 4.42* 3.99* 3.69*  ALBUMIN 2.4*  --   --   --   --   --  2.2* 2.1* 2.2* 2.3*  --   --   CALCIUM 6.2* 6.8*  --  6.5*  --  6.6*  --  7.3* 8.0* 8.2* 8.6* 8.9  PHOS 5.6* 5.8*  --  6.7*  --   --  5.2* 4.5 4.9*  --   --   --   AST 1,197*  --   --   --   --   --  164* 52*  --  36  --   --   ALT 376*  --   --   --   --   --  187* 116*  --  80*  --   --    < > = values in this interval not displayed.   Liver Function Tests: Recent  Labs  Lab 05/08/19 0344 05/09/19 0510 05/10/19 0651 05/11/19 0602  AST 164* 52*  --  36  ALT 187* 116*  --  80*  ALKPHOS 61 56  --  71  BILITOT 1.0 0.9  --  1.0  PROT 5.2* 5.0*  --  5.4*  ALBUMIN 2.2* 2.1* 2.2* 2.3*   No results for input(s): LIPASE, AMYLASE in the last 168 hours. No results for input(s): AMMONIA in the last 168 hours. CBC: Recent Labs  Lab 05/08/19 0344 05/09/19 0510 05/10/19 0651 05/12/19 0432 05/13/19 0558  WBC 8.8 8.3 9.2 10.7* 13.9*  NEUTROABS  --   --   --   --  9.9*  HGB 10.0* 10.0* 11.0* 11.1* 12.3*  HCT 33.3* 31.3* 34.0* 34.4* 40.0  MCV 96.8 91.5 91.2 93.5 96.4  PLT 230 230 227 248 270   Cardiac Enzymes: No results for input(s): CKTOTAL, CKMB, CKMBINDEX, TROPONINI in the last 168 hours. CBG: Recent Labs  Lab 05/12/19 1942 05/12/19 2353 05/13/19 0552 05/13/19 0741 05/13/19 1131  GLUCAP 113* 142* 130* 143* 147*    Iron Studies: No results for input(s): IRON, TIBC, TRANSFERRIN, FERRITIN in the last 72 hours. Studies/Results: Dg Chest Port 1 View  Result Date: 05/13/2019 CLINICAL DATA:  Hypoxia EXAM: PORTABLE CHEST 1 VIEW COMPARISON:  05/07/2019 FINDINGS: Interval extubation. Patchy bilateral airspace disease slightly increased since prior study. Heart is borderline in size. Small bilateral pleural effusions. IMPRESSION: Worsening patchy bilateral airspace opacities concerning for pneumonia. Small bilateral effusions. Electronically Signed   By: Rolm Baptise M.D.   On: 05/13/2019 11:42   . apixaban  5 mg Oral BID  . budesonide  1 puff Inhalation BID  . calcitRIOL  0.25 mcg Oral Daily  . Chlorhexidine Gluconate Cloth  6 each Topical Daily  . diltiazem  60 mg Oral Q8H  . feeding supplement (GLUCERNA SHAKE)  237 mL Oral BID BM  . Ipratropium-Albuterol  1 puff Inhalation Q6H  . levETIRAcetam  500 mg Oral BID  . magnesium oxide  800 mg Oral Daily  . metoprolol tartrate  100 mg Oral BID  . pantoprazole  40 mg Oral Daily  . sodium chloride  flush  3 mL Intravenous Q12H    BMET    Component Value Date/Time   NA 140 05/13/2019 0558   NA 138 03/25/2019   K 4.9 05/13/2019 0558   CL 105 05/13/2019 0558   CO2 20 (L) 05/13/2019 0558   GLUCOSE 140 (H) 05/13/2019 0558   BUN 53 (H) 05/13/2019 0558   BUN 43 (A) 03/25/2019   CREATININE 3.69 (H) 05/13/2019 0558   CREATININE 3.36 (H) 02/12/2019 1450   CALCIUM 8.9 05/13/2019 0558   GFRNONAA 16 (L) 05/13/2019 0558   GFRNONAA 18 (L) 02/12/2019 1450   GFRAA 19 (L) 05/13/2019 0558   GFRAA 21 (L) 02/12/2019 1450   CBC    Component Value Date/Time   WBC 13.9 (H) 05/13/2019 0558   RBC 4.15 (L) 05/13/2019 0558   HGB 12.3 (L) 05/13/2019 0558   HCT 40.0 05/13/2019 0558   PLT 270 05/13/2019 0558   MCV 96.4 05/13/2019 0558   MCH 29.6 05/13/2019 0558   MCHC 30.8 05/13/2019 0558   RDW 17.8 (H) 05/13/2019 0558   LYMPHSABS 1.7 05/13/2019 0558   MONOABS 1.5 (H) 05/13/2019 0558   EOSABS 0.3 05/13/2019 0558   BASOSABS 0.1 05/13/2019 0558   Assessment/Plan:  1. AKI/CKD- presumably due to ischemic ATN in setting of cardiac arrest with prolonged hypotension. Cr continues to slowly improve and UOP has remained stable. No indication for dialysis at this time.  1. Nothing further to add and will sign off.  Please call with questions or concerns 2. Pt should f/u with PCP after discharge and can be seen at our office in follow up in 4-6 weeks after discharge 2. Metabolic acidosis- improved with IV bicarb. 3. HTN/volume- stable  4. Anemia- Hgb improving 5. CAD s/p CABG with CMP EF 35-40%- s/p cardiac arrest (VT) 6. Atrial fib with RVR- per Cardiology  Donetta Potts, MD [facility] [phone]

## 2019-05-13 NOTE — Progress Notes (Signed)
PROGRESS NOTE    Dylan Goodman  GYJ:[id number] DOB: February 09, 1951 DOA: 05/06/2019 PCP: Steele Sizer, MD    Brief Narrative:  Patient is a 68 year old male with history of atrial fibrillation, GI bleed, coronary artery disease status post CABG, nasopharyngeal cancer, cardiomyopathy, chronic depression, CKD stage 4, diabetes mellitus, GI bleed , peripheral vascular disease, previous smoker, recent Covid-19 infection about 3 weeks ago before admission who presented to [facility] with seizure.  He sustained a cardiac arrest there in the emergency department after an seizure episode.  Due to Covid positivity he was sent to [facility] and was admitted under PCCM service.  He was defibrillated after cardiac arrest.  Intubated for airway protection.  Currently extubated and maintaining his saturation on nasal cannula.  Patient transferred to [facility] service on 05/08/19.  Hospital course remarkable for development of AKI on CKD,Afib with RVR.Cardiology,nephrology following. His overall status is improving.  He Might need 1 or 2 more days before discharge.    Assessment & Plan:   Active Problems:   Acute kidney injury superimposed on chronic kidney disease (HCC)   Atrial fibrillation with RVR (HCC)   Acute respiratory failure with hypoxemia (HCC)   Cardiac arrest (HCC)   Prolonged QT interval   Seizure (HCC)   1 status post cardiac arrest It is noted that patient's initial rhythm was reported as V. tach with conversion to A. fib with RVR and subsequently defibrillated.  Patient noted to be down for estimated 11 minutes.  Patient currently hemodynamically stable.  2D echo done 05/08/2019 with a EF of 40 to 45%.  Patient being followed by cardiology.  Continue current cardiac regimen of metoprolol.  Diltiazem added to regimen for better rate control.  Per cardiology.  2.  New onset A. fib with RVR Patient was noted to have been taken off the diltiazem drip yesterday and placed on metoprolol.  Patient  with heart rate sustained in the 120s to 130s per RN this morning.  Cardiology informed by RN and patient started on oral Cardizem for better rate control.  Eliquis for anticoagulation.  Replete magnesium and try to keep magnesium greater than 2.  Per cardiology.  3.  Acute kidney injury on chronic kidney disease stage IV Baseline creatinine noted to be approximately 2.5-3.  Acute kidney injury felt secondary to ATN due to hypotension secondary to cardiac arrest.  Patient also noted to have a metabolic acidosis.  Patient was on a bicarb drip which is subsequently been discontinued.  Patient with a urine output of 1.650 L over the past 24 hours.  Creatinine slowly trending down currently at 3.69 from a high of 5.17 on 05/09/2019.  Nephrology following and per nephrology no indication for dialysis at this time.  4.  Metabolic acidosis Likely secondary to problem #3.  Improved with bicarb drip.  Per nephrology.  5.  Acute hypoxic respiratory failure Patient was admitted and intubated on admission for airway protection.  Patient noted not to be on O2 at home.  Chest x-ray 05/07/2019 with stable bibasilar opacities.  Patient currently on 4 L nasal cannula with sats of 92%.  Continue to wean O2.  6.  Recent COVID-19 infection Patient diagnosed with COVID-19 approximately 3 weeks ago.  [place] test on 05/04/2019 was positive.  Patient has not required any call with specific treatment at this time.  Patient currently on airborne precautions.  Continue supportive care.  7.  Hypotension Patient had required a Levophed drip due to hypotension.  Blood pressure currently stable  off drip.  Follow.  8.  Transaminitis Felt secondary to shock liver.  Improved.  9.  Seizure Patient had presented with seizures.  Patient noted to have a seizure episode in the emergency room and started on Keppra.  Continue Keppra.  Neurology was following.  Will need outpatient follow-up with neurology.  10.   Deconditioning/debility Evaluated by PT OT.  Inpatient rehab assessing.  11.  Hypokalemia/hypomagnesemia Magnesium sulfate 4 g IV x1.  Keep magnesium greater than 2.  Keep potassium greater than 4 due to A. fib.   DVT prophylaxis: Eliquis Code Status: Full Family Communication: Updated patient.  No family at bedside. Disposition Plan: Home once A. fib is well controlled, clinical improvement, improvement with hypoxia and when cleared by nephrology and cardiology.   Consultants:   PCCM  Cardiology  Procedures:   Intubation/extubation  Antimicrobials:   IV Zosyn 05/06/2019>>>> 05/10/2019  IV vancomycin 05/07/2019 201 dose     Subjective: Patient noted to have heart rates consistently in the 120s to 130s per RN this morning.  Per RN patient also noted to be somewhat anxious.  Patient sleeping however easily arousable.  Patient stated had a panic attack this morning however has improved after receiving some anxiolytics.  Patient denies any chest pain.  Patient denies any significant shortness of breath.  Cough improving.  No seizures noted per RN.  Objective: Vitals:   05/13/19 1540 05/13/19 1545 05/13/19 1900 05/13/19 2000  BP:  131/88 139/88   Pulse: (!) 106 (!) 101 (!) 104 (!) (P) 106  Resp: (!) 32 (!) 34 (!) 30   Temp:  98.3 F (36.8 C)  (P) 98.5 F (36.9 C)  TempSrc:  Axillary  (P) Axillary  SpO2: 91% 91% 95%   Weight:      Height:        Intake/Output Summary (Last 24 hours) at 05/13/2019 2115 Last data filed at 05/13/2019 1500 Gross per 24 hour  Intake 500.82 ml  Output 1420 ml  Net -919.18 ml   Filed Weights   05/09/19 0422 05/09/19 2324 05/12/19 0500  Weight: 102.7 kg 104.8 kg 103 kg    Examination:  General exam: Appears calm and comfortable  Respiratory system: Clear to auscultation anterior lung fields. Respiratory effort normal. Cardiovascular system: Irregularly irregular.  No JVD, no murmurs, no rubs, no gallops.  No lower extremity edema.   Gastrointestinal system: Abdomen is nondistended, soft and nontender. No organomegaly or masses felt. Normal bowel sounds heard. Central nervous system: Alert and oriented. No focal neurological deficits. Extremities: Symmetric 5 x 5 power. Skin: No rashes, lesions or ulcers Psychiatry: Judgement and insight appear normal. Mood & affect appropriate.     Data Reviewed: I have personally reviewed following labs and imaging studies  CBC: Recent Labs  Lab 05/08/19 0344 05/09/19 0510 05/10/19 0651 05/12/19 0432 05/13/19 0558  WBC 8.8 8.3 9.2 10.7* 13.9*  NEUTROABS  --   --   --   --  9.9*  HGB 10.0* 10.0* 11.0* 11.1* 12.3*  HCT 33.3* 31.3* 34.0* 34.4* 40.0  MCV 96.8 91.5 91.2 93.5 96.4  PLT 230 230 227 248 604   Basic Metabolic Panel: Recent Labs  Lab 05/07/19 0342  05/07/19 1439  05/08/19 0344 05/09/19 0510 05/10/19 0651 05/11/19 0602 05/12/19 0432 05/13/19 0558  NA 140   < > 141   < >  --  139 140 138 140 140  K 4.8   < > 4.7   < >  --  3.5  3.6 3.4* 3.7 4.9  CL 109  --  112*   < >  --  105 103 103 103 105  CO2 13*  --  15*   < >  --  20* 21* 23 24 20*  GLUCOSE 172*  --  141*   < >  --  168* 161* 126* 126* 140*  BUN 42*  --  44*   < >  --  48* 48* 49* 50* 53*  CREATININE 3.81*  --  4.62*   < >  --  5.17* 4.66* 4.42* 3.99* 3.69*  CALCIUM 6.8*  --  6.5*   < >  --  7.3* 8.0* 8.2* 8.6* 8.9  MG 2.2  --  2.2  --  1.7 1.5* 1.5* 1.4* 1.2* 1.3*  PHOS 5.8*  --  6.7*  --  5.2* 4.5 4.9*  --   --   --    < > = values in this interval not displayed.   GFR: Estimated Creatinine Clearance: 24.4 mL/min (A) (by C-G formula based on SCr of 3.69 mg/dL (H)). Liver Function Tests: Recent Labs  Lab 05/08/19 0344 05/09/19 0510 05/10/19 0651 05/11/19 0602  AST 164* 52*  --  36  ALT 187* 116*  --  80*  ALKPHOS 61 56  --  71  BILITOT 1.0 0.9  --  1.0  PROT 5.2* 5.0*  --  5.4*  ALBUMIN 2.2* 2.1* 2.2* 2.3*   No results for input(s): LIPASE, AMYLASE in the last 168 hours. No results  for input(s): AMMONIA in the last 168 hours. Coagulation Profile: Recent Labs  Lab 05/08/19 0344  INR 1.5*   Cardiac Enzymes: No results for input(s): CKTOTAL, CKMB, CKMBINDEX, TROPONINI in the last 168 hours. BNP (last 3 results) No results for input(s): PROBNP in the last 8760 hours. HbA1C: No results for input(s): HGBA1C in the last 72 hours. CBG: Recent Labs  Lab 05/13/19 0552 05/13/19 0741 05/13/19 1131 05/13/19 1546 05/13/19 2030  GLUCAP 130* 143* 147* 146* 138*   Lipid Profile: No results for input(s): CHOL, HDL, LDLCALC, TRIG, CHOLHDL, LDLDIRECT in the last 72 hours. Thyroid Function Tests: No results for input(s): TSH, T4TOTAL, FREET4, T3FREE, THYROIDAB in the last 72 hours. Anemia Panel: No results for input(s): VITAMINB12, FOLATE, FERRITIN, TIBC, IRON, RETICCTPCT in the last 72 hours. Sepsis Labs: Recent Labs  Lab 05/07/19 0020  LATICACIDVEN 0.9    Recent Results (from the past 240 hour(s))  SARS Coronavirus 2 [facility] order, Performed in [facility] hospital lab) Nasopharyngeal Nasopharyngeal Swab     Status: Abnormal   Collection Time: 05/04/19 11:25 AM   Specimen: Nasopharyngeal Swab  Result Value Ref Range Status   SARS Coronavirus 2 POSITIVE (A) NEGATIVE Final    Comment: RESULT CALLED TO, READ BACK BY AND VERIFIED WITH: ASHLEY MAY AT 1248 ON 05/04/2019 [place]. (NOTE) If result is NEGATIVE SARS-CoV-2 target nucleic acids are NOT DETECTED. The SARS-CoV-2 RNA is generally detectable in upper and lower  respiratory specimens during the acute phase of infection. The lowest  concentration of SARS-CoV-2 viral copies this assay can detect is 250  copies / mL. A negative result does not preclude SARS-CoV-2 infection  and should not be used as the sole basis for treatment or other  patient management decisions.  A negative result may occur with  improper specimen collection / handling, submission of specimen other  than nasopharyngeal swab, presence of viral  mutation(s) within the  areas targeted by this assay, and inadequate number  of viral copies  (<250 copies / mL). A negative result must be combined with clinical  observations, patient history, and epidemiological information. If result is POSITIVE SARS-CoV-2 target nucleic acids are DETECTED.  The SARS-CoV-2 RNA is generally detectable in upper and lower  respiratory specimens during the acute phase of infection.  Positive  results are indicative of active infection with SARS-CoV-2.  Clinical  correlation with patient history and other diagnostic information is  necessary to determine patient infection status.  Positive results do  not rule out bacterial infection or co-infection with other viruses. If result is PRESUMPTIVE POSTIVE SARS-CoV-2 nucleic acids MAY BE PRESENT.   A presumptive positive result was obtained on the submitted specimen  and confirmed on repeat testing.  While 2019 novel coronavirus  (SARS-CoV-2) nucleic acids may be present in the submitted sample  additional confirmatory testing may be necessary for epidemiological  and / or clinical management purposes  to differentiate between  SARS-CoV-2 and other Sarbecovirus currently known to infect humans.  If clinically indicated additional testing with an alternate test  methodology [phone]) i s advised. The SARS-CoV-2 RNA is generally  detectable in upper and lower respiratory specimens during the acute  phase of infection. The expected result is Negative. Fact Sheet for Patients:  StrictlyIdeas.no Fact Sheet for Healthcare Providers: BankingDealers.co.za This test is not yet approved or cleared by the [place] FDA and has been authorized for detection and/or diagnosis of SARS-CoV-2 by FDA under an Emergency Use Authorization (EUA).  This EUA will remain in effect (meaning this test can be used) for the duration of the COVID-19 declaration under Section 564(b)(1)  of the Act, 21 U.S.C. section 360bbb-3(b)(1), unless the authorization is terminated or revoked sooner. Performed at [facility], [address] [place], [place] [address]   Blood Culture (routine x 2)     Status: None   Collection Time: 05/05/19  9:14 PM   Specimen: BLOOD  Result Value Ref Range Status   Specimen Description BLOOD LEFT ANTECUBITAL  Final   Special Requests   Final    BOTTLES DRAWN AEROBIC AND ANAEROBIC Blood Culture results may not be optimal due to an excessive volume of blood received in culture bottles   Culture   Final    NO GROWTH 5 DAYS Performed at [facility], [address] [place], [place] [address]    Report Status 05/10/2019 FINAL  Final  Blood Culture (routine x 2)     Status: None   Collection Time: 05/05/19  9:40 PM   Specimen: BLOOD  Result Value Ref Range Status   Specimen Description BLOOD BLOOD RIGHT WRIST  Final   Special Requests   Final    BOTTLES DRAWN AEROBIC AND ANAEROBIC Blood Culture adequate volume   Culture   Final    NO GROWTH 5 DAYS Performed at [facility], [address] [place], [place] [address]    Report Status 05/10/2019 FINAL  Final  MRSA PCR Screening     Status: None   Collection Time: 05/06/19  5:31 PM   Specimen: Nasal Mucosa; Nasopharyngeal  Result Value Ref Range Status   MRSA by PCR NEGATIVE NEGATIVE Final    Comment:        The GeneXpert MRSA Assay (FDA approved for NASAL specimens only), is one component of a comprehensive MRSA colonization surveillance program. It is not intended to diagnose MRSA infection nor to guide or monitor treatment for MRSA infections. Performed at [facility],  [address] [place], [place] [address]   Culture, blood (routine x 2)     Status: None   Collection Time: 05/06/19  8:10 PM   Specimen: BLOOD RIGHT HAND  Result Value Ref Range Status   Specimen Description BLOOD RIGHT HAND  Final   Special Requests   Final    BOTTLES  DRAWN AEROBIC AND ANAEROBIC Blood Culture results may not be optimal due to an inadequate volume of blood received in culture bottles   Culture   Final    NO GROWTH 5 DAYS Performed at [facility], [address] [place], [place] [address]    Report Status 05/11/2019 FINAL  Final  Culture, blood (routine x 2)     Status: None   Collection Time: 05/06/19  8:33 PM   Specimen: BLOOD LEFT HAND  Result Value Ref Range Status   Specimen Description BLOOD LEFT HAND  Final   Special Requests   Final    AEROBIC BOTTLE ONLY Blood Culture results may not be optimal due to an inadequate volume of blood received in culture bottles   Culture   Final    NO GROWTH 5 DAYS Performed at [facility], [place] [address], [place], [place] [address]    Report Status 05/11/2019 FINAL  Final  Urine culture     Status: None   Collection Time: 05/06/19  9:17 PM   Specimen: Urine, Random  Result Value Ref Range Status   Specimen Description URINE, RANDOM  Final   Special Requests NONE  Final   Culture   Final    NO GROWTH Performed at [facility], [place] [address] [place], [place] [address]    Report Status 05/07/2019 FINAL  Final  Culture, respiratory (tracheal aspirate)     Status: None   Collection Time: 05/07/19 10:24 AM   Specimen: Tracheal Aspirate; Respiratory  Result Value Ref Range Status   Specimen Description TRACHEAL ASPIRATE  Final   Special Requests NONE  Final   Gram Stain NO WBC SEEN RARE BUDDING YEAST SEEN   Final   Culture   Final    Consistent with normal respiratory flora. Performed at [facility], [place] [address] [place], [place] [address]    Report Status 05/09/2019 FINAL  Final         Radiology Studies: Dg Chest Port 1 View  Result Date: 05/13/2019 CLINICAL DATA:  Hypoxia EXAM: PORTABLE CHEST 1 VIEW COMPARISON:  05/07/2019 FINDINGS: Interval extubation. Patchy bilateral airspace disease slightly increased since prior study. Heart is borderline  in size. Small bilateral pleural effusions. IMPRESSION: Worsening patchy bilateral airspace opacities concerning for pneumonia. Small bilateral effusions. Electronically Signed   By: Rolm Baptise M.D.   On: 05/13/2019 11:42        Scheduled Meds: . apixaban  5 mg Oral BID  . budesonide  1 puff Inhalation BID  . calcitRIOL  0.25 mcg Oral Daily  . Chlorhexidine Gluconate Cloth  6 each Topical Daily  . diltiazem  60 mg Oral Q8H  . feeding supplement (GLUCERNA SHAKE)  237 mL Oral BID BM  . Ipratropium-Albuterol  1 puff Inhalation Q6H  . levETIRAcetam  500 mg Oral BID  . magnesium oxide  800 mg Oral Daily  . metoprolol tartrate  100 mg Oral BID  . pantoprazole  40 mg Oral Daily  . sodium chloride flush  3 mL Intravenous Q12H   Continuous Infusions: . sodium chloride  LOS: 7 days    Time spent: 40 minutes    Irine Seal, MD [facility]  If 7PM-7AM, please contact night-coverage [email or web address] 05/13/2019, 9:15 PM

## 2019-05-13 NOTE — Care Management (Addendum)
CM reached out to CIR regarding the parameter documented - pt needs to be >20 days COVID symptom free. Current admit appears to be  cardiac related, however pt was admitted 9/13-9/16 for COVID.  Per H&P 05/06/19;  pt does not have an active infection at this point.   Caitlin with CIR to follow up with CIR attending.  CM also discussed this with attending.   TOC will continue to follow

## 2019-05-13 NOTE — Progress Notes (Signed)
Daily Nursing Note  Received report from [employer], Therapist, sports. Assessed patient who was quite anxious at the time. Noted to be tachypneic, Afib w. RVR to 130's-140's, complaining of not feeling well. Spoke to Dr. [place] Silos who started PRN ativan & order magnesium sulfate IV x1.Spoke to Dr. Dani Gobble who recommended starting patient on diltiazem 52m PO Q8H. Patient provided these medications with relief of anxiety and rapid rate. Gotten OOB to chair in afternoon for 1.5 hrs which was well tolerated. (+) small bowel movement. Patients wife, PMardene Celesteupdated by phone. PMardene Celesteinitially frustrated that patient could not go to CIR though case management called her to discuss alternative placement option.  All patient needs met during the day.  MEWS consistently Yellow throughout the course of the day.  Vital Signs MEWS/VS Documentation      05/13/2019 1545   MEWS Score:  3   MEWS Score Color:  Yellow   Resp:  (!) 34   Pulse:  (!) 101   BP:  131/88   Temp:  98.3 F (36.8 C)   O2 Device:  Nasal Cannula   O2 Flow Rate (L/min):  2 L/min   Level of Consciousness:  Alert     MFreada BergeronFerolito 05/13/2019,5:47 PM

## 2019-05-13 NOTE — Plan of Care (Signed)
  Problem: Clinical Measurements: Goal: Respiratory complications will improve Outcome: Progressing   Problem: Activity: Goal: Risk for activity intolerance will decrease Outcome: Progressing   

## 2019-05-14 ENCOUNTER — Inpatient Hospital Stay (HOSPITAL_COMMUNITY): Payer: Medicare Other

## 2019-05-14 DIAGNOSIS — N179 Acute kidney failure, unspecified: Secondary | ICD-10-CM | POA: Diagnosis not present

## 2019-05-14 DIAGNOSIS — I469 Cardiac arrest, cause unspecified: Secondary | ICD-10-CM | POA: Diagnosis not present

## 2019-05-14 DIAGNOSIS — N189 Chronic kidney disease, unspecified: Secondary | ICD-10-CM | POA: Diagnosis not present

## 2019-05-14 DIAGNOSIS — J9601 Acute respiratory failure with hypoxia: Secondary | ICD-10-CM | POA: Diagnosis not present

## 2019-05-14 LAB — BLOOD GAS, ARTERIAL
Acid-base deficit: 2.9 mmol/L — ABNORMAL HIGH (ref 0.0–2.0)
Bicarbonate: 21.8 mmol/L (ref 20.0–28.0)
Drawn by: 236041
FIO2: 100
O2 Saturation: 81.9 %
Patient temperature: 98.6
pCO2 arterial: 41 mmHg (ref 32.0–48.0)
pH, Arterial: 7.346 — ABNORMAL LOW (ref 7.350–7.450)
pO2, Arterial: 55.5 mmHg — ABNORMAL LOW (ref 83.0–108.0)

## 2019-05-14 LAB — CBC WITH DIFFERENTIAL/PLATELET
Abs Immature Granulocytes: 0.33 10*3/uL — ABNORMAL HIGH (ref 0.00–0.07)
Basophils Absolute: 0.1 10*3/uL (ref 0.0–0.1)
Basophils Relative: 0 %
Eosinophils Absolute: 0 10*3/uL (ref 0.0–0.5)
Eosinophils Relative: 0 %
HCT: 39.3 % (ref 39.0–52.0)
Hemoglobin: 12.4 g/dL — ABNORMAL LOW (ref 13.0–17.0)
Immature Granulocytes: 2 %
Lymphocytes Relative: 9 %
Lymphs Abs: 1.3 10*3/uL (ref 0.7–4.0)
MCH: 30.2 pg (ref 26.0–34.0)
MCHC: 31.6 g/dL (ref 30.0–36.0)
MCV: 95.6 fL (ref 80.0–100.0)
Monocytes Absolute: 1.1 10*3/uL — ABNORMAL HIGH (ref 0.1–1.0)
Monocytes Relative: 7 %
Neutro Abs: 11.6 10*3/uL — ABNORMAL HIGH (ref 1.7–7.7)
Neutrophils Relative %: 82 %
Platelets: 258 10*3/uL (ref 150–400)
RBC: 4.11 MIL/uL — ABNORMAL LOW (ref 4.22–5.81)
RDW: 18.2 % — ABNORMAL HIGH (ref 11.5–15.5)
WBC: 14.4 10*3/uL — ABNORMAL HIGH (ref 4.0–10.5)
nRBC: 1.2 % — ABNORMAL HIGH (ref 0.0–0.2)

## 2019-05-14 LAB — BRAIN NATRIURETIC PEPTIDE: B Natriuretic Peptide: 2845.2 pg/mL — ABNORMAL HIGH (ref 0.0–100.0)

## 2019-05-14 LAB — MAGNESIUM: Magnesium: 1.9 mg/dL (ref 1.7–2.4)

## 2019-05-14 LAB — GLUCOSE, CAPILLARY
Glucose-Capillary: 136 mg/dL — ABNORMAL HIGH (ref 70–99)
Glucose-Capillary: 143 mg/dL — ABNORMAL HIGH (ref 70–99)
Glucose-Capillary: 151 mg/dL — ABNORMAL HIGH (ref 70–99)
Glucose-Capillary: 156 mg/dL — ABNORMAL HIGH (ref 70–99)
Glucose-Capillary: 177 mg/dL — ABNORMAL HIGH (ref 70–99)
Glucose-Capillary: 181 mg/dL — ABNORMAL HIGH (ref 70–99)

## 2019-05-14 LAB — RENAL FUNCTION PANEL
Albumin: 2.5 g/dL — ABNORMAL LOW (ref 3.5–5.0)
Anion gap: 15 (ref 5–15)
BUN: 64 mg/dL — ABNORMAL HIGH (ref 8–23)
CO2: 20 mmol/L — ABNORMAL LOW (ref 22–32)
Calcium: 9.1 mg/dL (ref 8.9–10.3)
Chloride: 105 mmol/L (ref 98–111)
Creatinine, Ser: 3.84 mg/dL — ABNORMAL HIGH (ref 0.61–1.24)
GFR calc Af Amer: 18 mL/min — ABNORMAL LOW (ref >60)
GFR calc non Af Amer: 15 mL/min — ABNORMAL LOW (ref >60)
Glucose, Bld: 161 mg/dL — ABNORMAL HIGH (ref 70–99)
Phosphorus: 5.8 mg/dL — ABNORMAL HIGH (ref 2.5–4.6)
Potassium: 4.9 mmol/L (ref 3.5–5.1)
Sodium: 140 mmol/L (ref 135–145)

## 2019-05-14 LAB — C-REACTIVE PROTEIN: CRP: 16.8 mg/dL — ABNORMAL HIGH (ref <1.0)

## 2019-05-14 LAB — FERRITIN: Ferritin: 637 ng/mL — ABNORMAL HIGH (ref 24–336)

## 2019-05-14 LAB — PROCALCITONIN: Procalcitonin: 1.78 ng/mL

## 2019-05-14 IMAGING — DX DG CHEST 1V PORT
1 series · 1 of 1 positions shown · non-contrast
Comparison: [DATE].  [DATE].  [DATE].

CLINICAL DATA: Hypoxia.

EXAM:
PORTABLE CHEST 1 VIEW

[chest ap]
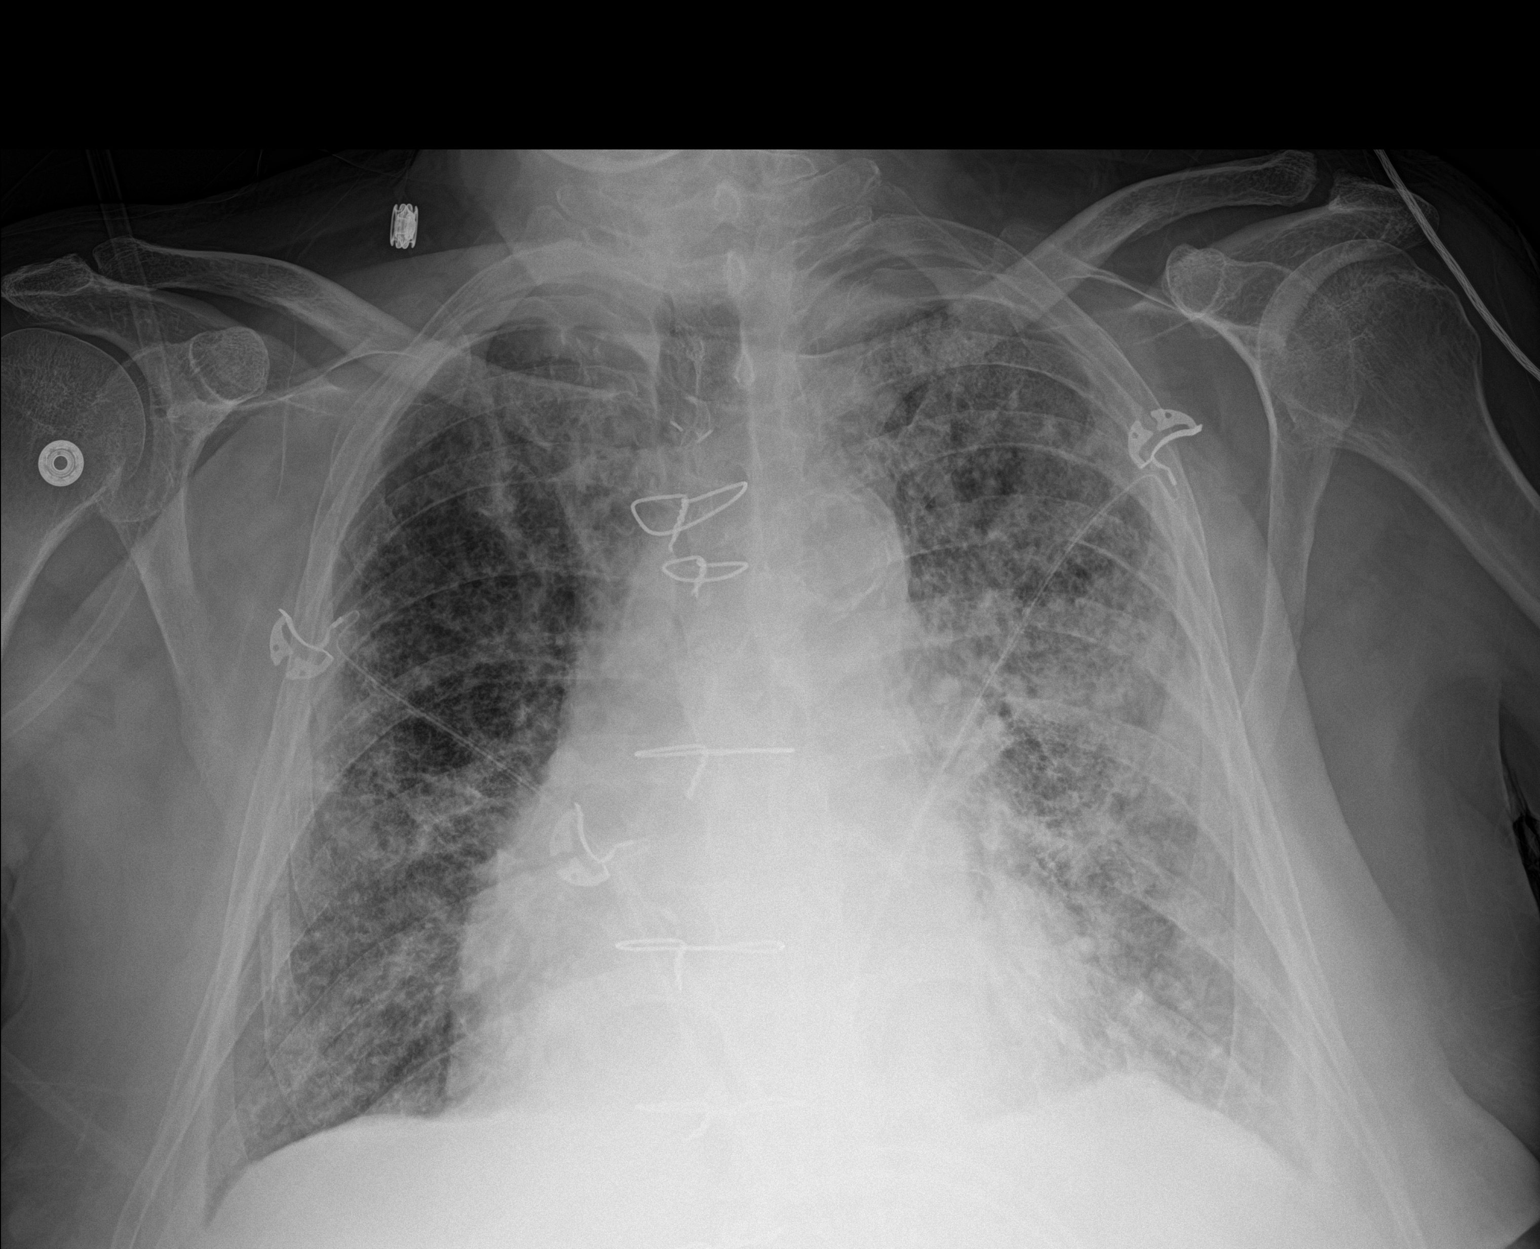

[1 of 1 positions shown; findings below may reference images not displayed]

FINDINGS: Prior median sternotomy. Stable cardiomegaly. Persistent patchy
bilateral pulmonary infiltrates. Worsening of the left upper lung
infiltrate noted on today's exam. Underlying chronic interstitial
disease most likely present. Small bilateral pleural effusions. No
pneumothorax.
IMPRESSION: 1. Persistent patchy bilateral pulmonary infiltrates. Worsening of
the left upper lung infiltrate noted on today's exam. Underlying
chronic interstitial lung disease most likely present. Bilateral
pleural effusions are unchanged.

2.  Prior median sternotomy.  Stable cardiomegaly.

## 2019-05-14 MED ORDER — DILTIAZEM HCL ER COATED BEADS 240 MG PO CP24
240.0000 mg | ORAL_CAPSULE | Freq: Every day | ORAL | Status: DC
  Administered 2019-05-14 – 2019-05-16 (×3): 240 mg via ORAL
  Filled 2019-05-14 (×3): qty 1

## 2019-05-14 MED ORDER — HYDROXYZINE HCL 25 MG PO TABS
25.0000 mg | ORAL_TABLET | Freq: Three times a day (TID) | ORAL | Status: DC | PRN
  Filled 2019-05-14: qty 1

## 2019-05-14 MED ORDER — FUROSEMIDE 10 MG/ML IJ SOLN
40.0000 mg | Freq: Once | INTRAMUSCULAR | Status: AC
  Administered 2019-05-14: 40 mg via INTRAVENOUS
  Filled 2019-05-14: qty 4

## 2019-05-14 MED ORDER — METHYLPREDNISOLONE SODIUM SUCC 40 MG IJ SOLR
40.0000 mg | Freq: Two times a day (BID) | INTRAMUSCULAR | Status: DC
  Administered 2019-05-14 – 2019-05-19 (×12): 40 mg via INTRAVENOUS
  Filled 2019-05-14 (×12): qty 1

## 2019-05-14 NOTE — Progress Notes (Signed)
Seen by respiratory stat ABG done and chest x--ray, placed in high fowler with non-rebreather, sats at present 95-100%,

## 2019-05-14 NOTE — Progress Notes (Signed)
Breathing appears progressively more labored, retracting, saturation low 90's on 4L of oxygen. Retracting- Dr.kirby/respiratory made aware-will continue to monitor.

## 2019-05-14 NOTE — Progress Notes (Signed)
PROGRESS NOTE    Dylan Goodman  WUJ:[id number] DOB: 1950/08/13 DOA: 05/06/2019 PCP: Steele Sizer, MD    Brief Narrative:  Patient is a 68 year old male with history of atrial fibrillation, GI bleed, coronary artery disease status post CABG, nasopharyngeal cancer, cardiomyopathy, chronic depression, CKD stage 4, diabetes mellitus, GI bleed , peripheral vascular disease, previous smoker, recent Covid-19 infection about 3 weeks ago before admission who presented to [facility] with seizure.  He sustained a cardiac arrest there in the emergency department after an seizure episode.  Due to Covid positivity he was sent to [facility] and was admitted under PCCM service.  He was defibrillated after cardiac arrest.  Intubated for airway protection.  Currently extubated and maintaining his saturation on nasal cannula.  Patient transferred to [facility] service on 05/08/19.  Hospital course remarkable for development of AKI on CKD,Afib with RVR.Cardiology,nephrology following. His overall status is improving.  He Might need 1 or 2 more days before discharge.    Assessment & Plan:   Active Problems:   Acute kidney injury superimposed on chronic kidney disease (HCC)   Atrial fibrillation with RVR (HCC)   Acute respiratory failure with hypoxemia (HCC)   Cardiac arrest (HCC)   Prolonged QT interval   Seizure (HCC)   1 status post cardiac arrest It is noted that patient's initial rhythm was reported as V. tach with conversion to A. fib with RVR and subsequently defibrillated.  Patient noted to be down for estimated 11 minutes.  Patient currently hemodynamically stable.  2D echo done 05/08/2019 with a EF of 40 to 45%.  Patient being followed by cardiology.  Continue current cardiac regimen of metoprolol.  Diltiazem added to regimen for better rate control.  Per cardiology.  2.  New onset A. fib with RVR Patient was noted to have been taken off the diltiazem drip on 05/12/2019, and placed on metoprolol.   Patient with heart rate sustained in the 120s to 130s per RN the morning of 05/13/2019.  Oral Cardizem added to patient's current regimen of metoprolol for better rate control.  Eliquis for anticoagulation.  Keep magnesium greater than 2.  Keep potassium greater than 4.  Appreciate cardiology input and recommendations.   3.  Acute kidney injury on chronic kidney disease stage IV Baseline creatinine noted to be approximately 2.5-3.  Acute kidney injury felt secondary to ATN due to hypotension secondary to cardiac arrest.  Patient also noted to have a metabolic acidosis.  Patient was on a bicarb drip which is subsequently been discontinued.  Patient with a urine output of 1.245 L over the past 24 hours.  Creatinine fluctuating currently at 3.84 from 3.69 from 5.17 on 05/09/2019.  We will give a dose of IV Lasix due to worsening respiratory status. Nephrology following and per nephrology no indication for dialysis at this time.  4.  Metabolic acidosis Likely secondary to problem #3.  Improved with bicarb drip.  Per nephrology.  5.  Acute hypoxic respiratory failure Patient was admitted and intubated on admission for airway protection.  Patient noted not to be on O2 at home.  Chest x-ray 05/07/2019 with stable bibasilar opacities.  Patient with worsening hypoxia and worsening shortness of breath with use of accessory muscles of respiration this morning.  Patient currently on nonrebreather at 10 L.  Patient with poor air movement on examination.  Chest x-ray with persistent patchy bilateral pulmonary infiltrates worsening of left upper lung infiltrate.  Underlying chronic interstitial lung disease most likely.  Bilateral pleural effusions  unchanged.  ABG done with a pH of 7.346, PCO2 of 41, PO2 of 55.  Patient on anticoagulation with Eliquis and as such doubt it for PE.  Patient was COVID-19 +10 days ago.  Check a CRP, ferritin.  Procalcitonin ordered per PCCM.  Will reconsult with PCCM for further evaluation and  management.  ??  If ferritin, CRP and d-dimer are elevated may need to treat with IV steroids and possible remdisivir, however will defer to critical care medicine.  Patient noted to have been on IV fluids due to worsening renal function.  Patient positive 4.7 L during this hospitalization.  Current weight of 216.93 pounds from 203.04 pounds on 05/06/2019.  We will give a dose of Lasix 40 mg IV x1.  Reconsult with PCCM.   6.  Recent COVID-19 infection Patient diagnosed with COVID-19 approximately 3 weeks ago.  COVID-19 test on 05/04/2019 was positive.  Patient has not required any COVID specific treatment at this time.  Patient now with some acute respiratory distress with increased O2 requirements now on 10 L.  Chest x-ray done early this morning with persistent patchy bilateral pulmonary infiltrates worsening of the left upper lung infiltrate.  Underlying chronic interstitial lung disease most likely present.  Bilateral pleural effusions unchanged.  ABG done with a pH of 7.346, PCO2 of 41, PO2 of 55.  Will reconsult with pulmonary and critical care for further evaluation due to respiratory distress.  May need to place patient on IV Solu-Medrol however will defer to PCCM.  Patient currently on airborne precautions.  Continue supportive care.  7.  Hypotension Patient had required a Levophed drip due to hypotension.  Blood pressure currently stable off drip.  Patient now on oral metoprolol and Cardizem.  Follow.  8.  Transaminitis Felt secondary to shock liver.  Improved.  Follow.  9.  Seizure Patient had presented with seizures.  Patient noted to have a seizure episode in the emergency room and started on Keppra.  Continue Keppra.  Neurology was following.  Will need outpatient follow-up with neurology.  10.  Deconditioning/debility Evaluated by PT OT.  Inpatient rehab assessing.  11.  Hypokalemia/hypomagnesemia Magnesium at 1.9.  Potassium at 4.9.  Follow.   DVT prophylaxis: Eliquis Code Status:  Full Family Communication: Updated patient.  No family at bedside. Disposition Plan: Home once A. fib is well controlled, clinical improvement, improvement with hypoxia and when cleared by nephrology and cardiology.   Consultants:   PCCM  Cardiology  Procedures:   Intubation/extubation  Chest x-ray 05/14/2019  Antimicrobials:   IV Zosyn 05/06/2019>>>> 05/10/2019  IV vancomycin 05/07/2019 201 dose     Subjective: Patient on NRB 10 L with sats in low 90s.  Patient early on per RN was noted to be confused however confusion improved as patient is alert oriented to self place and time.  Knows who the president is.  No seizures noted.  Patient denies any chest pain.   Objective: Vitals:   05/14/19 0417 05/14/19 0600 05/14/19 0641 05/14/19 0800  BP: (!) 146/83  (!) 137/96 (!) 133/100  Pulse: 100 (!) 104  (!) 102  Resp: (!) 27   (!) 27  Temp: 97.6 F (36.4 C)   97.7 F (36.5 C)  TempSrc: Axillary   Axillary  SpO2: (!) 88% 90%  94%  Weight:  98.4 kg    Height:        Intake/Output Summary (Last 24 hours) at 05/14/2019 0841 Last data filed at 05/14/2019 0613 Gross per 24 hour  Intake 20.82 ml  Output 775 ml  Net -754.18 ml   Filed Weights   05/09/19 2324 05/12/19 0500 05/14/19 0600  Weight: 104.8 kg 103 kg 98.4 kg    Examination:  General exam: On nonrebreather at 10 L. Respiratory system: Poor air movement.  Tight.  Minimal expiratory wheezing.  Decreased breath sounds in the bases.  Use of accessory muscles of respiration.  Cardiovascular system: Irregularly irregular.  No JVD, no murmurs, no rubs, no gallops.  No lower extremity edema.  Gastrointestinal system: Abdomen is soft, nontender, nondistended, positive bowel sounds.  No rebound.  No guarding.   Central nervous system: Alert and oriented. No focal neurological deficits. Extremities: Symmetric 5 x 5 power. Skin: No rashes, lesions or ulcers Psychiatry: Judgement and insight appear normal. Mood & affect  appropriate.     Data Reviewed: I have personally reviewed following labs and imaging studies  CBC: Recent Labs  Lab 05/09/19 0510 05/10/19 0651 05/12/19 0432 05/13/19 0558 05/14/19 0517  WBC 8.3 9.2 10.7* 13.9* 14.4*  NEUTROABS  --   --   --  9.9* 11.6*  HGB 10.0* 11.0* 11.1* 12.3* 12.4*  HCT 31.3* 34.0* 34.4* 40.0 39.3  MCV 91.5 91.2 93.5 96.4 95.6  PLT 230 227 248 270 557   Basic Metabolic Panel: Recent Labs  Lab 05/07/19 1439  05/08/19 0344 05/09/19 0510 05/10/19 0651 05/11/19 0602 05/12/19 0432 05/13/19 0558 05/14/19 0517  NA 141   < >  --  139 140 138 140 140 140  K 4.7   < >  --  3.5 3.6 3.4* 3.7 4.9 4.9  CL 112*   < >  --  105 103 103 103 105 105  CO2 15*   < >  --  20* 21* 23 24 20* 20*  GLUCOSE 141*   < >  --  168* 161* 126* 126* 140* 161*  BUN 44*   < >  --  48* 48* 49* 50* 53* 64*  CREATININE 4.62*   < >  --  5.17* 4.66* 4.42* 3.99* 3.69* 3.84*  CALCIUM 6.5*   < >  --  7.3* 8.0* 8.2* 8.6* 8.9 9.1  MG 2.2  --  1.7 1.5* 1.5* 1.4* 1.2* 1.3* 1.9  PHOS 6.7*  --  5.2* 4.5 4.9*  --   --   --  5.8*   < > = values in this interval not displayed.   GFR: Estimated Creatinine Clearance: 22.9 mL/min (A) (by C-G formula based on SCr of 3.84 mg/dL (H)). Liver Function Tests: Recent Labs  Lab 05/08/19 0344 05/09/19 0510 05/10/19 0651 05/11/19 0602 05/14/19 0517  AST 164* 52*  --  36  --   ALT 187* 116*  --  80*  --   ALKPHOS 61 56  --  71  --   BILITOT 1.0 0.9  --  1.0  --   PROT 5.2* 5.0*  --  5.4*  --   ALBUMIN 2.2* 2.1* 2.2* 2.3* 2.5*   No results for input(s): LIPASE, AMYLASE in the last 168 hours. No results for input(s): AMMONIA in the last 168 hours. Coagulation Profile: Recent Labs  Lab 05/08/19 0344  INR 1.5*   Cardiac Enzymes: No results for input(s): CKTOTAL, CKMB, CKMBINDEX, TROPONINI in the last 168 hours. BNP (last 3 results) No results for input(s): PROBNP in the last 8760 hours. HbA1C: No results for input(s): HGBA1C in the last 72  hours. CBG: Recent Labs  Lab 05/13/19 1546 05/13/19 2030 05/14/19  0011 05/14/19 0254 05/14/19 0803  GLUCAP 146* 138* 143* 181* 151*   Lipid Profile: No results for input(s): CHOL, HDL, LDLCALC, TRIG, CHOLHDL, LDLDIRECT in the last 72 hours. Thyroid Function Tests: No results for input(s): TSH, T4TOTAL, FREET4, T3FREE, THYROIDAB in the last 72 hours. Anemia Panel: No results for input(s): VITAMINB12, FOLATE, FERRITIN, TIBC, IRON, RETICCTPCT in the last 72 hours. Sepsis Labs: No results for input(s): PROCALCITON, LATICACIDVEN in the last 168 hours.  Recent Results (from the past 240 hour(s))  SARS Coronavirus 2 [facility] order, Performed in [facility] hospital lab) Nasopharyngeal Nasopharyngeal Swab     Status: Abnormal   Collection Time: 05/04/19 11:25 AM   Specimen: Nasopharyngeal Swab  Result Value Ref Range Status   SARS Coronavirus 2 POSITIVE (A) NEGATIVE Final    Comment: RESULT CALLED TO, READ BACK BY AND VERIFIED WITH: ASHLEY MAY AT 1248 ON 05/04/2019 [place]. (NOTE) If result is NEGATIVE SARS-CoV-2 target nucleic acids are NOT DETECTED. The SARS-CoV-2 RNA is generally detectable in upper and lower  respiratory specimens during the acute phase of infection. The lowest  concentration of SARS-CoV-2 viral copies this assay can detect is 250  copies / mL. A negative result does not preclude SARS-CoV-2 infection  and should not be used as the sole basis for treatment or other  patient management decisions.  A negative result may occur with  improper specimen collection / handling, submission of specimen other  than nasopharyngeal swab, presence of viral mutation(s) within the  areas targeted by this assay, and inadequate number of viral copies  (<250 copies / mL). A negative result must be combined with clinical  observations, patient history, and epidemiological information. If result is POSITIVE SARS-CoV-2 target nucleic acids are DETECTED.  The SARS-CoV-2 RNA is generally  detectable in upper and lower  respiratory specimens during the acute phase of infection.  Positive  results are indicative of active infection with SARS-CoV-2.  Clinical  correlation with patient history and other diagnostic information is  necessary to determine patient infection status.  Positive results do  not rule out bacterial infection or co-infection with other viruses. If result is PRESUMPTIVE POSTIVE SARS-CoV-2 nucleic acids MAY BE PRESENT.   A presumptive positive result was obtained on the submitted specimen  and confirmed on repeat testing.  While 2019 novel coronavirus  (SARS-CoV-2) nucleic acids may be present in the submitted sample  additional confirmatory testing may be necessary for epidemiological  and / or clinical management purposes  to differentiate between  SARS-CoV-2 and other Sarbecovirus currently known to infect humans.  If clinically indicated additional testing with an alternate test  methodology [phone]) i s advised. The SARS-CoV-2 RNA is generally  detectable in upper and lower respiratory specimens during the acute  phase of infection. The expected result is Negative. Fact Sheet for Patients:  StrictlyIdeas.no Fact Sheet for Healthcare Providers: BankingDealers.co.za This test is not yet approved or cleared by the [place] FDA and has been authorized for detection and/or diagnosis of SARS-CoV-2 by FDA under an Emergency Use Authorization (EUA).  This EUA will remain in effect (meaning this test can be used) for the duration of the COVID-19 declaration under Section 564(b)(1) of the Act, 21 U.S.C. section 360bbb-3(b)(1), unless the authorization is terminated or revoked sooner. Performed at [facility], [address] [place], [place] [address]   Blood Culture (routine x 2)     Status: None   Collection Time: 05/05/19  9:14 PM   Specimen: BLOOD  Result  Value Ref Range Status    Specimen Description BLOOD LEFT ANTECUBITAL  Final   Special Requests   Final    BOTTLES DRAWN AEROBIC AND ANAEROBIC Blood Culture results may not be optimal due to an excessive volume of blood received in culture bottles   Culture   Final    NO GROWTH 5 DAYS Performed at [facility], [address] [place], [place] [address]    Report Status 05/10/2019 FINAL  Final  Blood Culture (routine x 2)     Status: None   Collection Time: 05/05/19  9:40 PM   Specimen: BLOOD  Result Value Ref Range Status   Specimen Description BLOOD BLOOD RIGHT WRIST  Final   Special Requests   Final    BOTTLES DRAWN AEROBIC AND ANAEROBIC Blood Culture adequate volume   Culture   Final    NO GROWTH 5 DAYS Performed at [facility], [address] [place], [place] [address]    Report Status 05/10/2019 FINAL  Final  MRSA PCR Screening     Status: None   Collection Time: 05/06/19  5:31 PM   Specimen: Nasal Mucosa; Nasopharyngeal  Result Value Ref Range Status   MRSA by PCR NEGATIVE NEGATIVE Final    Comment:        The GeneXpert MRSA Assay (FDA approved for NASAL specimens only), is one component of a comprehensive MRSA colonization surveillance program. It is not intended to diagnose MRSA infection nor to guide or monitor treatment for MRSA infections. Performed at [facility], [place] [address], [place], [place] [address]   Culture, blood (routine x 2)     Status: None   Collection Time: 05/06/19  8:10 PM   Specimen: BLOOD RIGHT HAND  Result Value Ref Range Status   Specimen Description BLOOD RIGHT HAND  Final   Special Requests   Final    BOTTLES DRAWN AEROBIC AND ANAEROBIC Blood Culture results may not be optimal due to an inadequate volume of blood received in culture bottles   Culture   Final    NO GROWTH 5 DAYS Performed at [facility], [place] [address], [place], [place] [address]    Report Status 05/11/2019 FINAL  Final  Culture, blood (routine x 2)      Status: None   Collection Time: 05/06/19  8:33 PM   Specimen: BLOOD LEFT HAND  Result Value Ref Range Status   Specimen Description BLOOD LEFT HAND  Final   Special Requests   Final    AEROBIC BOTTLE ONLY Blood Culture results may not be optimal due to an inadequate volume of blood received in culture bottles   Culture   Final    NO GROWTH 5 DAYS Performed at [facility], [place] [address] [place], [place] [address]    Report Status 05/11/2019 FINAL  Final  Urine culture     Status: None   Collection Time: 05/06/19  9:17 PM   Specimen: Urine, Random  Result Value Ref Range Status   Specimen Description URINE, RANDOM  Final   Special Requests NONE  Final   Culture   Final    NO GROWTH Performed at [facility], [place] [address] [place],  [address]    Report Status 05/07/2019 FINAL  Final  Culture, respiratory (tracheal aspirate)     Status: None   Collection Time: 05/07/19 10:24 AM   Specimen: Tracheal Aspirate; Respiratory  Result Value Ref Range Status   Specimen  Description TRACHEAL ASPIRATE  Final   Special Requests NONE  Final   Gram Stain NO WBC SEEN RARE BUDDING YEAST SEEN   Final   Culture   Final    Consistent with normal respiratory flora. Performed at [facility], [place] [address], [place], [place] [address]    Report Status 05/09/2019 FINAL  Final         Radiology Studies: Dg Chest Port 1 View  Result Date: 05/14/2019 CLINICAL DATA:  Hypoxia. EXAM: PORTABLE CHEST 1 VIEW COMPARISON:  05/13/2019.  05/06/2019.  04/13/2019. FINDINGS: Prior median sternotomy. Stable cardiomegaly. Persistent patchy bilateral pulmonary infiltrates. Worsening of the left upper lung infiltrate noted on today's exam. Underlying chronic interstitial disease most likely present. Small bilateral pleural effusions. No pneumothorax. IMPRESSION: 1. Persistent patchy bilateral pulmonary infiltrates. Worsening of the left upper lung infiltrate noted on today's exam.  Underlying chronic interstitial lung disease most likely present. Bilateral pleural effusions are unchanged. 2.  Prior median sternotomy.  Stable cardiomegaly. Electronically Signed   By: Marcello Moores  Register   On: 05/14/2019 07:16   Dg Chest Port 1 View  Result Date: 05/13/2019 CLINICAL DATA:  Hypoxia EXAM: PORTABLE CHEST 1 VIEW COMPARISON:  05/07/2019 FINDINGS: Interval extubation. Patchy bilateral airspace disease slightly increased since prior study. Heart is borderline in size. Small bilateral pleural effusions. IMPRESSION: Worsening patchy bilateral airspace opacities concerning for pneumonia. Small bilateral effusions. Electronically Signed   By: Rolm Baptise M.D.   On: 05/13/2019 11:42        Scheduled Meds:  apixaban  5 mg Oral BID   budesonide  1 puff Inhalation BID   calcitRIOL  0.25 mcg Oral Daily   Chlorhexidine Gluconate Cloth  6 each Topical Daily   diltiazem  240 mg Oral Daily   feeding supplement (GLUCERNA SHAKE)  237 mL Oral BID BM   Ipratropium-Albuterol  1 puff Inhalation Q6H   levETIRAcetam  500 mg Oral BID   magnesium oxide  800 mg Oral Daily   metoprolol tartrate  100 mg Oral BID   pantoprazole  40 mg Oral Daily   sodium chloride flush  3 mL Intravenous Q12H   Continuous Infusions:  sodium chloride       LOS: 8 days    Time spent: 40 minutes    Irine Seal, MD [facility]  If 7PM-7AM, please contact night-coverage [email or web address] 05/14/2019, 8:41 AM

## 2019-05-14 NOTE — Progress Notes (Signed)
Restless, intermittent complaint of dizziness, meds as per oder-Oxygen saturation at 90's with increase O2.resting on and off, voiding freely in urinal will continue to observe.

## 2019-05-14 NOTE — Progress Notes (Signed)
   Vital Signs MEWS/VS Documentation      05/14/2019 1155 05/14/2019 1300 05/14/2019 1459 05/14/2019 1718   MEWS Score:  2  3  -  0   MEWS Score Color:  Yellow  Yellow  -  Green   Resp:  (!) 23  (!) 33  -  20   Pulse:  99  (!) 108  -  (!) 101   BP:  (!) 145/99  (!) 140/108  -  -   Temp:  97.6 F (36.4 C)  -  -  -   O2 Device:  -  -  HFNC  -   O2 Flow Rate (L/min):  -  -  30 L/min  -   FiO2 (%):  -  -  100 %  -          Pt found sitting up in bed with NRB mask removed. O2 sats 75% on RA. Had difficulty maintaining O2 above 89% once mask reapplied. O2 increased to 15 lpm. Patient not able to speak in full sentences when asked orientation questions. When asked "Where are you right now?" He responded, "At the police station". MD paged, RR called. MD and RR RN came to bedside to assess pt. RT placed pt on HFNC at 30 lpm.Will continue to monitor pt. Ercel Normoyle 05/14/2019,5:25 PM

## 2019-05-14 NOTE — Progress Notes (Signed)
Adequate rate control (90-95 bpm) on current regimen of metoprolol 100 mg BID and Diltiazem 240 mg daily. Will follow from a distance . Please let us know when he is ready for DC, to make follow up arrangements.  Sanda Klein, MD, [facility] [phone] office [phone] pager

## 2019-05-14 NOTE — Progress Notes (Signed)
Tachycardic and tachycpnic most of shift, complained of dizziness, meclizine given x1 with fair results, Ambien to help patient sleep- Oxygen increased to 4L to maintain saturation at 90's while asleep- voiding freely, Bloodsugar Q4hrs-130-180's. Repositioned for comfort , will continue to monitor.

## 2019-05-14 NOTE — Progress Notes (Signed)
Updated wife per phone

## 2019-05-14 NOTE — Progress Notes (Signed)
PT Cancellation Note  Patient Details Name: Kelten Enochs MRN: [id number] DOB: Nov 27, 1950   Cancelled Treatment:    Reason Eval/Treat Not Completed: Medical issues which prohibited therapy(Pt with respiratory distress this am.  30%heated HFNC. )Nurse asked PT to HOLD therapy.   Denice Paradise 05/14/2019, 10:43 AM Catalaya Garr,PT Acute Rehabilitation Services Pager:  [phone]  Office:  [phone]

## 2019-05-14 NOTE — Progress Notes (Signed)
NAME:  Dylan Goodman, MRN:  [id number], DOB:  [birth year], LOS: 66 ADMISSION DATE:  05-30-19, CONSULTATION DATE:  05-30-23 REFERRING MD:  Dr Jimmye Norman [facility] EDP, CHIEF COMPLAINT:  Cardiac arrest   Brief History   68 year old male recently admitted with COVID PNA 9/13 now presenting with seizure and subsequently suffered brief cardiac arrest in the [facility] ED. Intubated and transferred to [facility].   Extubated.  Reconsulted 10/15 for worsening respiratory status.  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  Significant Hospital Events   9/14-9/19 admit to [facility] for COVID pneumonia.  10/5 to ED for Chest pain. Discharged home after negative cardiac. 29-May-2023 To ED for seizure 05-30-23 coded in ED while awaiting placement  Consults:  Neurology Cardiology Nephrology  Procedures:  ETT 05/30/23 >10/8  Significant Diagnostic Tests:  Lower extremity doppler 05/30/23 > CT head May 29, 2023 neg CXR 10/15 ILD, possible some volume overload Renal US 9/14 WNL Echo 05/08/19  1. Left ventricular ejection fraction, by visual estimation, is 45 to 50%. The left ventricle has mildly decreased function. Normal left ventricular size. There is mildly increased left ventricular hypertrophy.  2. Left ventricular diastolic Doppler parameters are indeterminate pattern of LV diastolic filling.  3. Difficult to visualize LV contours.  4. Global right ventricle has normal systolic function.The right  ventricular size is normal. No increase in right ventricular wall thickness.  5. Left atrial size was severely dilated.  6. Right atrial size was moderately dilated.  7. The mitral valve is normal in structure. Mild mitral valve regurgitation. No evidence of mitral stenosis.  8. The tricuspid valve is normal in structure. Tricuspid valve regurgitation is mild.  9. The aortic valve is tricuspid Aortic valve regurgitation was not visualized by color flow Doppler. Mild aortic valve sclerosis without stenosis. 10. The pulmonic valve was normal in structure. Pulmonic valve regurgitation is not visualized by color flow Doppler. 11. Moderately elevated pulmonary artery systolic pressure. 12. The inferior vena cava is normal in size with greater than 50% respiratory variability, suggesting right atrial pressure of 3 mmHg.  Micro Data:  COVID 2023/05/29 > POSITIVE Blood 05-30-2023 > neg Urine 05/30/23 > neg Trach aspirate 30-May-2023 >  Antimicrobials:  Zosyn 2023-05-30 >10/11  Interim history/subjective:  More dyspneic this AM and increased O2 requirements as well as confusion prompting re-evaluation.  Objective   Blood pressure (!) 133/100, pulse (!) 102, temperature 97.7 F (36.5 C), temperature source Axillary, resp. rate (!) 27, height 5\' 11"  (1.803 m), weight 98.4 kg, SpO2 94 %.       FiO2 (%):  [40 %] 40 %   Intake/Output Summary (Last 24 hours) at 05/14/2019 0859 Last data filed at 05/14/2019 8469 Gross per 24 hour  Intake 20.82 ml  Output 775 ml  Net -754.18 ml   Filed Weights   05/09/19 2324 05/12/19 0500 05/14/19 0600  Weight: 104.8 kg 103 kg 98.4 kg    Examination: from door with aid of RN given COVID positive status.  GEN: ill appearing man sitting in bed HEENT: NRB in place CV: Tachycardic, ext warm PULM: Crackles at bases, +accessory  muscle use GI: Soft, +BS EXT: Trace edema NEURO: Moves all 4 ext to command PSYCH: confused SKIN: Chronic venous stasis changes  ABG 7.34/41/55 on 15L NRB  Cr stable CKD4 WBC stable Sugars look good 9/13 COVID+ Resolved Hospital Problem list   N/A  Assessment & Plan:  68 year old male with diagnosis of COVID 19 over a month ago presenting initially with cardiac arrest thought due to arrhythmia.  Weaned from vent a week ago.  Now having worsening respiratory distress.  # Acute hypoxemic respiratory failure initially after cardiac arrest now worsening with wide differential.   Prior to the COVID diagnosis he was being worked up for worsening DOE at [facility] pulmonary.  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  VQ scan neg 03/18/19.  CXR with worsened interstitial changes that could just be due to edema or the COVID caused an additional insult on top of patient's existing disease.  Also had amiodarone further complicating clinical picture.  I see no obvious evidence of infection.  Given this, would treat as inflammatory and fluid.  I would not be surprised if he has swallowing dysfunction after everything as well. 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  - Start solumedrol - Lasix challenge fine, watch BMET, nephro following - Nebs fine as ordered - Try to avoid further amiodarone for now, CCB and BB per cardiology - Switch to HFNC, his COVID at this point is likely just dead material, airborne isolation should continue for now since we really don't know how to approach these chronic positive PCR cases - SLP consult - IS/flutter - Okay to keep in PCU for now do not see any immediate need for intubation, I'll check on him a little later, call me if issues  32 minutes critical care time not including any separately billable procedures Erskine Emery MD

## 2019-05-14 NOTE — Significant Event (Addendum)
Rapid Response Event Note  Overview: Respiratory Distress - Increased Oxygen Demands  Initial Focused Assessment: Called by nurse with concerns of patient having increased WOB and increasing oxygen needs. Per nurse, patient is on 10L NRB (unsure why is wasn't at 15L) and saturations are 86-90%, RR 28-32, per nurse when she initially came in, patient had taken the NRB off and saturations had dropped. Upon arrival, patient was down in the bed, we positioned him, oxygen saturations were 93%, RR was 28, + accessory muscle use, mild WOB noticed, lung sounds very diminished throughout. Patient denies SOB but is confused per the nurse, normally he is oriented. He was able to answer most of my orientation questions, but was pulling at telemetry wires, his lines, appears to be a little delirious at times. Other VS were stable. TRH MD was paged and came to the bedside immediately.  Interventions: -- No RRT Interventions.   Plan of Care: -- [facility] MD consulted PCCM MD, PCCM MD came to bedside, evaluated patient, orders for HHFNC, Lasix IV, Labs, and Steroids.  -- Low threshold to transfer to ICU -- If patient does not improve over the course of next 1-2 hours, RN to [facility] MD or PCCM MD and will transfer patient to ICU.  -- I already contacted the MICU CRN and made her aware.  -- I spoke with nurse around 1300, patient had improved and looks better  Call RRRN if needed   Event Summary:  Call Time 0809 Arrival Time 0811 End Time 0900   Nikitas Davtyan, [place]

## 2019-05-14 NOTE — Progress Notes (Signed)
SLP Note  Patient Details Name: Dylan Goodman MRN: [id number] DOB: 1950-08-19   Cancelled treatment:        Orders received for BSE. RN reports no change in swallow since SLE consulted 05/08/2019. Consulted with MD who discontinued orders. ST available if needs arise.  [name], [facility] [place] Speech Language Pathologist Office: [phone] Pager: [phone]  [name] 05/14/2019, 3:30 PM

## 2019-05-15 DIAGNOSIS — J9601 Acute respiratory failure with hypoxia: Secondary | ICD-10-CM | POA: Diagnosis not present

## 2019-05-15 LAB — BASIC METABOLIC PANEL
Anion gap: 11 (ref 5–15)
BUN: 76 mg/dL — ABNORMAL HIGH (ref 8–23)
CO2: 23 mmol/L (ref 22–32)
Calcium: 8.4 mg/dL — ABNORMAL LOW (ref 8.9–10.3)
Chloride: 104 mmol/L (ref 98–111)
Creatinine, Ser: 3.65 mg/dL — ABNORMAL HIGH (ref 0.61–1.24)
GFR calc Af Amer: 19 mL/min — ABNORMAL LOW (ref >60)
GFR calc non Af Amer: 16 mL/min — ABNORMAL LOW (ref >60)
Glucose, Bld: 191 mg/dL — ABNORMAL HIGH (ref 70–99)
Potassium: 4.6 mmol/L (ref 3.5–5.1)
Sodium: 138 mmol/L (ref 135–145)

## 2019-05-15 LAB — CBC WITH DIFFERENTIAL/PLATELET
Abs Immature Granulocytes: 0.11 10*3/uL — ABNORMAL HIGH (ref 0.00–0.07)
Basophils Absolute: 0 10*3/uL (ref 0.0–0.1)
Basophils Relative: 0 %
Eosinophils Absolute: 0 10*3/uL (ref 0.0–0.5)
Eosinophils Relative: 0 %
HCT: 38.7 % — ABNORMAL LOW (ref 39.0–52.0)
Hemoglobin: 12.1 g/dL — ABNORMAL LOW (ref 13.0–17.0)
Immature Granulocytes: 1 %
Lymphocytes Relative: 7 %
Lymphs Abs: 0.6 10*3/uL — ABNORMAL LOW (ref 0.7–4.0)
MCH: 30.4 pg (ref 26.0–34.0)
MCHC: 31.3 g/dL (ref 30.0–36.0)
MCV: 97.2 fL (ref 80.0–100.0)
Monocytes Absolute: 0.3 10*3/uL (ref 0.1–1.0)
Monocytes Relative: 4 %
Neutro Abs: 7.8 10*3/uL — ABNORMAL HIGH (ref 1.7–7.7)
Neutrophils Relative %: 88 %
Platelets: 225 10*3/uL (ref 150–400)
RBC: 3.98 MIL/uL — ABNORMAL LOW (ref 4.22–5.81)
RDW: 18.3 % — ABNORMAL HIGH (ref 11.5–15.5)
WBC: 8.9 10*3/uL (ref 4.0–10.5)
nRBC: 0.4 % — ABNORMAL HIGH (ref 0.0–0.2)

## 2019-05-15 LAB — GLUCOSE, CAPILLARY
Glucose-Capillary: 154 mg/dL — ABNORMAL HIGH (ref 70–99)
Glucose-Capillary: 159 mg/dL — ABNORMAL HIGH (ref 70–99)
Glucose-Capillary: 170 mg/dL — ABNORMAL HIGH (ref 70–99)
Glucose-Capillary: 180 mg/dL — ABNORMAL HIGH (ref 70–99)
Glucose-Capillary: 199 mg/dL — ABNORMAL HIGH (ref 70–99)
Glucose-Capillary: 213 mg/dL — ABNORMAL HIGH (ref 70–99)
Glucose-Capillary: 218 mg/dL — ABNORMAL HIGH (ref 70–99)

## 2019-05-15 LAB — FERRITIN: Ferritin: 409 ng/mL — ABNORMAL HIGH (ref 24–336)

## 2019-05-15 LAB — C-REACTIVE PROTEIN: CRP: 14.2 mg/dL — ABNORMAL HIGH (ref <1.0)

## 2019-05-15 MED ORDER — INSULIN ASPART 100 UNIT/ML ~~LOC~~ SOLN
0.0000 [IU] | Freq: Three times a day (TID) | SUBCUTANEOUS | Status: DC
  Administered 2019-05-16 – 2019-05-17 (×5): 2 [IU] via SUBCUTANEOUS
  Administered 2019-05-17: 3 [IU] via SUBCUTANEOUS
  Administered 2019-05-18 (×3): 2 [IU] via SUBCUTANEOUS
  Administered 2019-05-19 (×2): 3 [IU] via SUBCUTANEOUS
  Administered 2019-05-20: 5 [IU] via SUBCUTANEOUS

## 2019-05-15 MED ORDER — FUROSEMIDE 10 MG/ML IJ SOLN
40.0000 mg | Freq: Once | INTRAMUSCULAR | Status: AC
  Administered 2019-05-15: 40 mg via INTRAVENOUS
  Filled 2019-05-15: qty 4

## 2019-05-15 MED ORDER — GLUCERNA SHAKE PO LIQD
237.0000 mL | Freq: Three times a day (TID) | ORAL | Status: DC
  Administered 2019-05-18: 237 mL via ORAL
  Filled 2019-05-15 (×3): qty 237

## 2019-05-15 NOTE — Progress Notes (Signed)
Occupational Therapy Treatment Patient Details Name: Najeeb Uptain MRN: [id number] DOB: 09-24-1950 Today's Date: 05/15/2019    History of present illness Pt is a 68 yo male s/p recent hospitalization 9/13 for COVID+ PNA. THis admission due to dizziness, witnessed 2-minute tonic/clonic type seizure. COVID Test again positive. 10/7 when he abruptly developed respiratory distress and tachycardia requiring CPR. 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   OT comments  Pt progressing toward stated goals. Presented to OT sitting EOB to eat lunch with PT, having DOE 3/4 with RR ~30 and desatting on 30L/min HFNC.Pt positioned upright in bed to continue meal. Education given on ECS strategies as it applies to feeding to improve independence. Pt taking appropriate rest breaks and choosing less energy consuming foods to eat next meal. Will continue to progress mobility as tolerated. Updated recommendations to CIR for continued intensive therapy prior to returning home. Will continue to follow.    Follow Up Recommendations  CIR    Equipment Recommendations  None recommended by OT    Recommendations for Other Services      Precautions / Restrictions Precautions Precautions: Fall Precaution Comments: watch O2; dizziness with OOB Restrictions Weight Bearing Restrictions: No       Mobility Bed Mobility Overal bed mobility: Needs Assistance Bed Mobility: Sit to Supine     Supine to sit: Min assist Sit to supine: Mod assist   General bed mobility comments: was sitting EOB with PT at arrival, once back in room pt back supine due to SOB  Transfers                 General transfer comment: NT due to fatigue and SOB sitting EOB to eat lunch    Balance     Sitting balance-Leahy Scale: Fair Sitting balance - Comments: can sit EOB and work on eating lunch, but the moment I put  a knee on the bed to support him due to falling sats pt with LOB to R                                   ADL either performed or assessed with clinical judgement   ADL   Eating/Feeding: Set up;Sitting Eating/Feeding Details (indicate cue type and reason): cues for pacing self during SOB episode                                         Vision Baseline Vision/History: No visual deficits Vision Assessment?: No apparent visual deficits   Perception     Praxis      Cognition Arousal/Alertness: Awake/alert Behavior During Therapy: WFL for tasks assessed/performed Overall Cognitive Status: Within Functional Limits for tasks assessed                                          Exercises     Shoulder Instructions       General Comments patient on high flow O2 warmed air 20L, initially SpO2 low 90's then down into 80's sitting EOB then as low as 67%, pt moderately dyspneic so assisted to supine and realized sensor probe out of tubing for temperature  so replaced and SpO2 back up to 90's.    Pertinent Vitals/ Pain       Pain Assessment: Faces Faces Pain Scale: No hurt  Home Living                                          Prior Functioning/Environment              Frequency  Min 2X/week        Progress Toward Goals  OT Goals(current goals can now be found in the care plan section)  Progress towards OT goals: Progressing toward goals  Acute Rehab OT Goals Patient Stated Goal: to get back home and fish OT Goal Formulation: With patient Time For Goal Achievement: 05/22/19 Potential to Achieve Goals: Good  Plan Discharge plan needs to be updated    Co-evaluation                 AM-PAC OT "6 Clicks" Daily Activity     Outcome Measure   Help from another person eating meals?: A Little Help from another person taking care of personal grooming?: A Little Help from another person toileting, which  includes using toliet, bedpan, or urinal?: A Little Help from another person bathing (including washing, rinsing, drying)?: A Lot Help from another person to put on and taking off regular upper body clothing?: A Little Help from another person to put on and taking off regular lower body clothing?: A Lot 6 Click Score: 16    End of Session Equipment Utilized During Treatment: Oxygen  OT Visit Diagnosis: Unsteadiness on feet (R26.81);Muscle weakness (generalized) (M62.81)   Activity Tolerance Patient tolerated treatment well   Patient Left in bed;with call bell/phone within reach;with bed alarm set;with nursing/sitter in room   Nurse Communication Mobility status        Time: 1400-1410 OT Time Calculation (min): 10 min  Charges: OT General Charges $OT Visit: 1 Visit OT Treatments $Therapeutic Activity: 8-22 mins  Zenovia Jarred, MSOT, OTR/L Behavioral Health OT/ Acute Relief OT [facility] Office: [place] 05/15/2019, 4:41 PM

## 2019-05-15 NOTE — Progress Notes (Signed)
   Vital Signs MEWS/VS Documentation      05/15/2019 1500 05/15/2019 1555 05/15/2019 1628 05/15/2019 1701   MEWS Score:  1  0  -  2   MEWS Score Color:  Green  Green  -  Yellow   Resp:  (!) 24  20  -  (!) 25   Pulse:  100  97  -  (!) 108   BP:  -  -  -  (!) 132/94   Temp:  -  -  -  98.5 F (36.9 C)   O2 Device:  -  -  HFNC  -   O2 Flow Rate (L/min):  -  -  30 L/min  -   FiO2 (%):  -  -  100 %  -     RR:23 and HR 107. This is not an acute change for the patient. Pt denies chest pain or SOB. Dr. Marcello Moores stated to continue to monitor the patient, and if it worsens to contact his ICU physician.       Dylan Goodman 05/15/2019,5:11 PM

## 2019-05-15 NOTE — Progress Notes (Signed)
Nutrition Follow-up  INTERVENTION:   -Glucerna Shake po TID, each supplement provides 220 kcal and 10 grams of protein  NUTRITION DIAGNOSIS:   Increased nutrient needs related to acute illness as evidenced by estimated needs.  Ongoing.  GOAL:   Patient will meet greater than or equal to 90% of their needs  Progressing.  MONITOR:   PO intake, Labs  ASSESSMENT:   68 yo male admitted with seizure and brief cardiac arrest in the [facility] ED. Required intubation and was transferred to [facility]. PMH includes recent COVID-19 PNA (at [facility] 9/14-9/19), nasopharyngeal cancer (05/2018), HF, CAD, chronic diarrhea r/t Menetrier's disease, DM-2, A fib, CKD-IV.  **RD working remotely**  Patient transferred from 36M on 10/9. Currently pt consuming most of trays per nursing notes.  Pt is drinking Glucerna shakes. Will increase to TID.  Admission weight: 203 lbs. Current weight: 211 lbs. I/Os: +3.6L since admit UOP 10/15: 1075 ml  Medications: IV Lasix, MAG-OX tablet Labs reviewed: CBGs: 159-170 GFR: 16  Diet Order:   Diet Order            Diet heart healthy/carb modified Room service appropriate? Yes with Assist; Fluid consistency: Thin  Diet effective now              EDUCATION NEEDS:   Not appropriate for education at this time  Skin:  Skin Assessment: Reviewed RN Assessment  Last BM:  10/14  Height:   Ht Readings from Last 1 Encounters:  05/06/19 5\' 11"  (1.803 m)    Weight:   Wt Readings from Last 1 Encounters:  05/15/19 96 kg    Ideal Body Weight:  78.2 kg  BMI:  Body mass index is 29.52 kg/m.  Estimated Nutritional Needs:   Kcal:  2100-2300  Protein:  110-130 gm  Fluid:  >/= 2.1 L  Clayton Bibles, MS, RD, LDN Inpatient Clinical Dietitian Pager: [phone] After Hours Pager: [phone]

## 2019-05-15 NOTE — Progress Notes (Signed)
NAME:  Dylan Goodman, MRN:  [id number], DOB:  [birth year], LOS: 9 ADMISSION DATE:  05/20/19, CONSULTATION DATE:  2023-05-20 REFERRING MD:  Dr Jimmye Norman [facility] EDP, CHIEF COMPLAINT:  Cardiac arrest   Brief History   68 year old male recently admitted with COVID PNA 9/13 now presenting with seizure and subsequently suffered brief cardiac arrest in the [facility] ED. Intubated and transferred to [facility].   Extubated.  Reconsulted 10/15 for worsening respiratory status.  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  Significant Hospital Events   9/14-9/19 admit to [facility] for COVID pneumonia.  10/5 to ED for Chest pain. Discharged home after negative cardiac. 2023/05/19 To ED for seizure May 20, 2023 coded in ED while awaiting placement  Consults:  Neurology Cardiology Nephrology  Procedures:  ETT 05-20-2023 >10/8  Significant Diagnostic Tests:  Lower extremity doppler 05/20/2023 > CT head 19-May-2023 neg CXR 10/15 ILD, possible some volume overload Renal US 9/14 WNL Echo 05/08/19  1. Left ventricular ejection fraction, by visual estimation, is 45 to 50%. The left ventricle has mildly decreased function. Normal left ventricular size. There is mildly increased left ventricular hypertrophy.  2. Left ventricular diastolic Doppler parameters are indeterminate pattern of LV diastolic filling.  3. Difficult to visualize LV contours.  4. Global right ventricle has normal systolic function.The right  ventricular size is normal. No increase in right ventricular wall thickness.  5. Left atrial size was severely dilated.  6. Right atrial size was moderately dilated.  7. The mitral valve is normal in structure. Mild mitral valve regurgitation. No evidence of mitral stenosis.  8. The tricuspid valve is normal in structure. Tricuspid valve regurgitation is mild.  9. The aortic valve is tricuspid Aortic valve regurgitation was not visualized by color flow Doppler. Mild aortic valve sclerosis without stenosis. 10. The pulmonic valve was normal in structure. Pulmonic valve regurgitation is not visualized by color flow Doppler. 11. Moderately elevated pulmonary artery systolic pressure. 12. The inferior vena cava is normal in size with greater than 50% respiratory variability, suggesting right atrial pressure of 3 mmHg.  Micro Data:  COVID 2023/05/19 > POSITIVE Blood May 20, 2023 > neg Urine 05-20-2023 > neg Trach aspirate 05/20/23 >  Antimicrobials:  Zosyn 20-May-2023 >10/11  Interim history/subjective:  No events, breathing is a bit better.  Denies pain.  States he is too dizzy to get to chair.  Objective   Blood pressure (!) 130/98, pulse (!) 104, temperature 98.7 F (37.1 C), temperature source Oral, resp. rate (!) 23, height 5\' 11"  (1.803 m), weight 96 kg, SpO2 95 %.       FiO2 (%):  [100 %] 100 %   Intake/Output Summary (Last 24 hours) at 05/15/2019 1323 Last data filed at 05/15/2019 0415 Gross per 24 hour  Intake -  Output 825 ml  Net -825 ml   Filed Weights   05/12/19 0500 05/14/19 0600 05/15/19 0413  Weight: 103 kg 98.4 kg 96 kg    Examination: from door with aid of RN given COVID positive status.  GEN: ill appearing man sitting in bed HEENT: HFNC in place CV: Tachycardic, ext warm  PULM: Crackles at bases, +accessory muscle use GI: Soft, +BS EXT: Trace edema NEURO: Moves all 4 ext to command PSYCH: confused SKIN: Chronic venous stasis changes  Cr stable  Resolved Hospital Problem list    N/A  Assessment & Plan:  68 year old male with diagnosis of COVID 19 over a month ago presenting initially with cardiac arrest thought due to arrhythmia.  Weaned from vent a week ago.  Now having worsening respiratory distress.  # Acute hypoxemic respiratory failure initially after cardiac arrest now worsening with wide differential.   Prior to the COVID diagnosis he was being worked up for worsening DOE at [facility] pulmonary.  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  VQ scan neg 03/18/19.  CXR with worsened interstitial changes that could just be due to edema or the COVID caused an additional insult on top of patient's existing disease.  Also had amiodarone further complicating clinical picture.  I see no obvious evidence of infection.  Given this, would treat as inflammatory and fluid.  Prior SLP eval WNL. 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  - Continue solumedrol - Another dose of lasix, -1L yesterday - Nebs fine as ordered - Try to avoid further amiodarone for now, CCB and BB per cardiology - Continue HFNC - Up to chair if we can - IS/flutter - Okay to keep in PCU - Will have weekend team check on patient  Erskine Emery MD

## 2019-05-15 NOTE — Progress Notes (Signed)
Physical Therapy Treatment Patient Details Name: Dylan Goodman MRN: [id number] DOB: 03-Oct-1950 Today's Date: 05/15/2019    History of Present Illness Pt is a 68 yo male s/p recent hospitalization 9/13 for COVID+ PNA. THis admission due to dizziness, witnessed 2-minute tonic/clonic type seizure. COVID Test again positive. 10/7 when he abruptly developed respiratory distress and tachycardia requiring CPR. 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    PT Comments    Patient with limited tolerance with hypoxia sitting EOB with malfunction of high flow [place] when temp sensor fell out.  Patient now with increased O2 requirement.  Feel he may progress and tolerate CIR so will continue skilled PT in the acute setting.  PT to follow.    Follow Up Recommendations  CIR     Equipment Recommendations  None recommended by PT    Recommendations for Other Services Rehab consult     Precautions / Restrictions Precautions Precautions: Fall Precaution Comments: watch O2; dizziness with OOB    Mobility  Bed Mobility Overal bed mobility: Needs Assistance Bed Mobility: Sit to Supine     Supine to sit: Min assist Sit to supine: Mod assist   General bed mobility comments: assisted for trunk upright and for bringing legs into bed  Transfers                 General transfer comment: NT due to fatigue and SOB sitting EOB to eat lunch  Ambulation/Gait                 Stairs             Wheelchair Mobility    Modified Rankin (Stroke Patients Only)       Balance     Sitting balance-Leahy Scale: Fair Sitting balance - Comments: can sit EOB and work on eating lunch, but the moment I put a knee on the bed to support him due to falling sats pt with LOB to R                                    Cognition Arousal/Alertness: Awake/alert Behavior  During Therapy: WFL for tasks assessed/performed Overall Cognitive Status: Within Functional Limits for tasks assessed                                        Exercises      General Comments General comments (skin integrity, edema, etc.): patient on high flow O2 warmed air 20L, initially SpO2 low 90's then down into 80's sitting EOB then as low as 67%, pt moderately dyspneic so assisted to supine and realized sensor probe out of tubing for temperature so replaced and SpO2 back up to 90's.      Pertinent Vitals/Pain Faces Pain Scale: No hurt    Home Living                      Prior Function            PT Goals (current goals can now be found in the care plan section) Progress towards PT goals: Not progressing toward goals - comment(hypoxic)    Frequency    Min 3X/week      PT Plan  Co-evaluation              AM-PAC PT "6 Clicks" Mobility   Outcome Measure  Help needed turning from your back to your side while in a flat bed without using bedrails?: A Little Help needed moving from lying on your back to sitting on the side of a flat bed without using bedrails?: A Little Help needed moving to and from a bed to a chair (including a wheelchair)?: A Lot Help needed standing up from a chair using your arms (e.g., wheelchair or bedside chair)?: A Lot Help needed to walk in hospital room?: Total Help needed climbing 3-5 steps with a railing? : Total 6 Click Score: 12    End of Session Equipment Utilized During Treatment: Oxygen(high flow) Activity Tolerance: Patient limited by fatigue;Other (comment)(hypoxia) Patient left: in bed;with call bell/phone within reach;Other (comment)(with OT)   PT Visit Diagnosis: Dizziness and giddiness (R42);Muscle weakness (generalized) (M62.81);Other abnormalities of gait and mobility (R26.89)     Time: 1335-1350 PT Time Calculation (min) (ACUTE ONLY): 15 min  Charges:  $Therapeutic Activity: 8-22  mins                     Magda Kiel, [place] [facility] [phone] 05/15/2019    Reginia Naas 05/15/2019, 4:17 PM

## 2019-05-15 NOTE — Discharge Instructions (Signed)

## 2019-05-15 NOTE — Progress Notes (Signed)
   Vital Signs MEWS/VS Documentation      05/15/2019 0413 05/15/2019 0500 05/15/2019 0700 05/15/2019 0945   MEWS Score:  1  1  1  2    MEWS Score Color:  Green  Green  Green  Yellow   Resp:  (!) 22  (!) 22  20  (!) 23   Pulse:  (!) 107  99  94  (!) 104   BP:  125/88  (!) 129/94  (!) 130/98  -   Temp:  98.7 F (37.1 C)  -  -  -   Level of Consciousness:  -  -  -  Alert       Pt RR went up to 27 and HR 111. Patient denies SOB and also denies chest pain. Updated Dr. Marcello Moores of his status.     Dylan Goodman 05/15/2019,12:46 PM

## 2019-05-15 NOTE — Progress Notes (Signed)
Pt alert - stated that nobody fed him today. Pt had eaten everything on dinner tray except milk carton and soup. Pt given meatloaf frozen meal tray. Pt informed per wife's request that he can call her anytime. Pt nodded in understanding. Pt able to answer all orientation questions - alert, appropriate in response, but forgetful.

## 2019-05-15 NOTE — Progress Notes (Signed)
PROGRESS NOTE    Dylan Goodman  WUJ:[id number] DOB: [birth year] DOA: 05/06/2019 PCP: Steele Sizer, MD    Brief Narrative:  Patient is a 68 year old male with history of atrial fibrillation, GI bleed, coronary artery disease status post CABG, nasopharyngeal cancer, cardiomyopathy, chronic depression, CKD stage 4, diabetes mellitus, GI bleed , peripheral vascular disease, previous smoker, recent Covid-19 infection about 3 weeks ago before admission who presented to [facility] with seizure.  He sustained a cardiac arrest there in the emergency department after an seizure episode.  Due to Covid positivity he was sent to [facility] and was admitted under PCCM service.  He was defibrillated after cardiac arrest.  Intubated for airway protection.  Currently extubated and maintaining his saturation on nasal cannula.  Patient transferred to [facility] service on 05/08/19.  Hospital course remarkable for development of AKI on CKD,Afib with RVR.Cardiology,nephrology following. His overall status is improving.  He Might need 1 or 2 more days before discharge.    Assessment & Plan:   Active Problems:   Acute kidney injury superimposed on chronic kidney disease (HCC)   Atrial fibrillation with RVR (HCC)   Acute respiratory failure with hypoxemia (HCC)   Cardiac arrest (HCC)   Prolonged QT interval   Seizure (HCC)   1 status post cardiac arrest It is noted that patient's initial rhythm was reported as V. tach with conversion to A. fib with RVR and subsequently defibrillated.  Patient noted to be down for estimated 11 minutes.  Patient currently hemodynamically stable.  2D echo done 05/08/2019 with a EF of 40 to 45%.  Patient being followed by cardiology.  Continue current cardiac regimen of metoprolol.  Diltiazem added to regimen for better rate control.  Per cardiology.  2.  New onset A. fib with RVR Patient was noted to have been taken off the diltiazem drip on 05/12/2019, and placed on metoprolol.   Patient with heart rate sustained in the 120s to 130s per RN the morning of 05/13/2019.  Oral Cardizem added to patient's current regimen of metoprolol for better rate control.  Eliquis for anticoagulation.  Keep magnesium greater than 2.  Keep potassium greater than 4.  Appreciate cardiology input and recommendations.   3.  Acute kidney injury on chronic kidney disease stage IV Baseline creatinine noted to be approximately 2.5-3.  Acute kidney injury felt secondary to ATN due to hypotension secondary to cardiac arrest.  Patient also noted to have a metabolic acidosis.  Patient was on a bicarb drip which is subsequently been discontinued.  Patient with a urine output of 1.075 L over the past 24 hours.  Creatinine fluctuating currently at 3.65 from 3.84 from 3.69 from 5.17 on 05/09/2019.  Nephrology following and per nephrology no indication for dialysis at this time.  4.  Metabolic acidosis Likely secondary to problem #3.  Improved with bicarb drip.  Per nephrology.  5.  Acute hypoxic respiratory failure/COPD exacerbation Patient was admitted and intubated on admission for airway protection.  Patient noted not to be on O2 at home.  Chest x-ray 05/07/2019 with stable bibasilar opacities.  Patient with worsening hypoxia and worsening shortness of breath with use of accessory muscles of respiration and was on 10 L nonrebreather the morning of 05/14/2019.  Patient with poor air movement on examination.  Chest x-ray with persistent patchy bilateral pulmonary infiltrates worsening of left upper lung infiltrate.  Underlying chronic interstitial lung disease most likely.  Bilateral pleural effusions unchanged.  ABG done with a pH of 7.346, PCO2  of 41, PO2 of 55.  Patient on anticoagulation with Eliquis and as such doubt it for PE.  Patient was COVID-19 +10 days ago.  Ferritin elevated but trending down.  Procalcitonin trending down.  CRP trending down.  PCCM has reassessed patient and patient started on IV steroids  due to concern for probable COPD exacerbation.  Patient given Lasix 40 mg IV x1 yesterday with urine output of 1.075 L over the past 24 hours.  PCCM following and appreciate input and recommendations.   6.  Recent COVID-19 infection Patient diagnosed with COVID-19 approximately 3 weeks ago.  COVID-19 test on 05/04/2019 was positive.  Patient has not required any COVID specific treatment at this time.  Patient now with some acute respiratory distress with increased O2 requirements now on 10 L.  Chest x-ray done the morning of 05/14/2019, with persistent patchy bilateral pulmonary infiltrates worsening of the left upper lung infiltrate.  Underlying chronic interstitial lung disease most likely present.  Bilateral pleural effusions unchanged.  ABG done with a pH of 7.346, PCO2 of 41, PO2 of 55.  Patient has been reassessed by critical care medicine and patient started on IV Solu-Medrol per PCCM for COPD exacerbation.  Continue current airborne precautions.  Continue supportive care.   7.  Hypotension Patient had required a Levophed drip due to hypotension.  Blood pressure currently stable off drip.  Patient now on oral metoprolol and Cardizem.  Follow.  8.  Transaminitis Felt secondary to shock liver.  Improved.  Follow.  9.  Seizure Patient had presented with seizures.  Patient noted to have a seizure episode in the emergency room and started on Keppra.  Continue Keppra.  Neurology was following.  Will need outpatient follow-up with neurology.  10.  Deconditioning/debility Evaluated by PT OT.  Inpatient rehab assessing.  11.  Hypokalemia/hypomagnesemia Magnesium at 1.9.  Potassium at 4.6.  Follow.   DVT prophylaxis: Eliquis Code Status: Full Family Communication: Updated patient.  No family at bedside. Disposition Plan: Home once A. fib is well controlled, clinical improvement, improvement with hypoxia and when cleared by nephrology and cardiology and critical care medicine.   Consultants:     PCCM  Cardiology  Procedures:   Intubation/extubation  Chest x-ray 05/14/2019  Antimicrobials:   IV Zosyn 05/06/2019>>>> 05/10/2019  IV vancomycin 05/07/2019 201 dose     Subjective: Patient on nasal cannula FiO2 of 30 with sats of 97%.  Patient states some improvement with shortness of breath since yesterday.  Denies any chest pain.  No seizures noted.  Objective: Vitals:   05/15/19 0200 05/15/19 0413 05/15/19 0500 05/15/19 0700  BP: (!) 131/92 125/88 (!) 129/94 (!) 130/98  Pulse: 96 (!) 107 99 94  Resp: (!) 21 (!) 22 (!) 22 20  Temp:  98.7 F (37.1 C)    TempSrc:  Oral    SpO2: 95% 91% 90% 92%  Weight:  96 kg    Height:        Intake/Output Summary (Last 24 hours) at 05/15/2019 1053 Last data filed at 05/15/2019 0415 Gross per 24 hour  Intake --  Output 825 ml  Net -825 ml   Filed Weights   05/12/19 0500 05/14/19 0600 05/15/19 0413  Weight: 103 kg 98.4 kg 96 kg    Examination:  General exam: On nonrebreather at 10 L. Respiratory system: Fair air movement.  Less tight.  No wheezing noted.  Decreased use of accessory muscles of respiration. Cardiovascular system: Irregularly irregular.  No JVD, no murmurs, no rubs,  no gallops.  No lower extremity edema.  Gastrointestinal system: Abdomen is tender, soft, positive bowel sounds.  No rebound.  No guarding.   Central nervous system: Alert and oriented. No focal neurological deficits. Extremities: Symmetric 5 x 5 power. Skin: No rashes, lesions or ulcers Psychiatry: Judgement and insight appear normal. Mood & affect appropriate.     Data Reviewed: I have personally reviewed following labs and imaging studies  CBC: Recent Labs  Lab 05/10/19 0651 05/12/19 0432 05/13/19 0558 05/14/19 0517 05/15/19 0539  WBC 9.2 10.7* 13.9* 14.4* 8.9  NEUTROABS  --   --  9.9* 11.6* 7.8*  HGB 11.0* 11.1* 12.3* 12.4* 12.1*  HCT 34.0* 34.4* 40.0 39.3 38.7*  MCV 91.2 93.5 96.4 95.6 97.2  PLT 227 248 270 258 093    Basic Metabolic Panel: Recent Labs  Lab 05/09/19 0510 05/10/19 0651 05/11/19 0602 05/12/19 0432 05/13/19 0558 05/14/19 0517 05/15/19 0539  NA 139 140 138 140 140 140 138  K 3.5 3.6 3.4* 3.7 4.9 4.9 4.6  CL 105 103 103 103 105 105 104  CO2 20* 21* 23 24 20* 20* 23  GLUCOSE 168* 161* 126* 126* 140* 161* 191*  BUN 48* 48* 49* 50* 53* 64* 76*  CREATININE 5.17* 4.66* 4.42* 3.99* 3.69* 3.84* 3.65*  CALCIUM 7.3* 8.0* 8.2* 8.6* 8.9 9.1 8.4*  MG 1.5* 1.5* 1.4* 1.2* 1.3* 1.9  --   PHOS 4.5 4.9*  --   --   --  5.8*  --    GFR: Estimated Creatinine Clearance: 23.9 mL/min (A) (by C-G formula based on SCr of 3.65 mg/dL (H)). Liver Function Tests: Recent Labs  Lab 05/09/19 0510 05/10/19 0651 05/11/19 0602 05/14/19 0517  AST 52*  --  36  --   ALT 116*  --  80*  --   ALKPHOS 56  --  71  --   BILITOT 0.9  --  1.0  --   PROT 5.0*  --  5.4*  --   ALBUMIN 2.1* 2.2* 2.3* 2.5*   No results for input(s): LIPASE, AMYLASE in the last 168 hours. No results for input(s): AMMONIA in the last 168 hours. Coagulation Profile: No results for input(s): INR, PROTIME in the last 168 hours. Cardiac Enzymes: No results for input(s): CKTOTAL, CKMB, CKMBINDEX, TROPONINI in the last 168 hours. BNP (last 3 results) No results for input(s): PROBNP in the last 8760 hours. HbA1C: No results for input(s): HGBA1C in the last 72 hours. CBG: Recent Labs  Lab 05/14/19 1707 05/14/19 1949 05/15/19 0017 05/15/19 0411 05/15/19 0836  GLUCAP 156* 177* 154* 213* 170*   Lipid Profile: No results for input(s): CHOL, HDL, LDLCALC, TRIG, CHOLHDL, LDLDIRECT in the last 72 hours. Thyroid Function Tests: No results for input(s): TSH, T4TOTAL, FREET4, T3FREE, THYROIDAB in the last 72 hours. Anemia Panel: Recent Labs    05/14/19 1516 05/15/19 0539  FERRITIN 637* 409*   Sepsis Labs: Recent Labs  Lab 05/14/19 0911  PROCALCITON 1.78    Recent Results (from the past 240 hour(s))  Blood Culture (routine x  2)     Status: None   Collection Time: 05/05/19  9:14 PM   Specimen: BLOOD  Result Value Ref Range Status   Specimen Description BLOOD LEFT ANTECUBITAL  Final   Special Requests   Final    BOTTLES DRAWN AEROBIC AND ANAEROBIC Blood Culture results may not be optimal due to an excessive volume of blood received in culture bottles   Culture   Final  NO GROWTH 5 DAYS Performed at [facility], [address] [place], [place] [address]    Report Status 05/10/2019 FINAL  Final  Blood Culture (routine x 2)     Status: None   Collection Time: 05/05/19  9:40 PM   Specimen: BLOOD  Result Value Ref Range Status   Specimen Description BLOOD BLOOD RIGHT WRIST  Final   Special Requests   Final    BOTTLES DRAWN AEROBIC AND ANAEROBIC Blood Culture adequate volume   Culture   Final    NO GROWTH 5 DAYS Performed at [facility], [address] [place], [place] [address]    Report Status 05/10/2019 FINAL  Final  MRSA PCR Screening     Status: None   Collection Time: 05/06/19  5:31 PM   Specimen: Nasal Mucosa; Nasopharyngeal  Result Value Ref Range Status   MRSA by PCR NEGATIVE NEGATIVE Final    Comment:        The GeneXpert MRSA Assay (FDA approved for NASAL specimens only), is one component of a comprehensive MRSA colonization surveillance program. It is not intended to diagnose MRSA infection nor to guide or monitor treatment for MRSA infections. Performed at [facility], [place] [address], [place], [place] [address]   Culture, blood (routine x 2)     Status: None   Collection Time: 05/06/19  8:10 PM   Specimen: BLOOD RIGHT HAND  Result Value Ref Range Status   Specimen Description BLOOD RIGHT HAND  Final   Special Requests   Final    BOTTLES DRAWN AEROBIC AND ANAEROBIC Blood Culture results may not be optimal due to an inadequate volume of blood received in culture bottles   Culture   Final    NO GROWTH 5 DAYS Performed at [facility], [place]  [address] [place], [place] [address]    Report Status 05/11/2019 FINAL  Final  Culture, blood (routine x 2)     Status: None   Collection Time: 05/06/19  8:33 PM   Specimen: BLOOD LEFT HAND  Result Value Ref Range Status   Specimen Description BLOOD LEFT HAND  Final   Special Requests   Final    AEROBIC BOTTLE ONLY Blood Culture results may not be optimal due to an inadequate volume of blood received in culture bottles   Culture   Final    NO GROWTH 5 DAYS Performed at [facility], [place] [address] [place], [place] [address]    Report Status 05/11/2019 FINAL  Final  Urine culture     Status: None   Collection Time: 05/06/19  9:17 PM   Specimen: Urine, Random  Result Value Ref Range Status   Specimen Description URINE, RANDOM  Final   Special Requests NONE  Final   Culture   Final    NO GROWTH Performed at [facility], [place] [address], [place], [place] [address]    Report Status 05/07/2019 FINAL  Final  Culture, respiratory (tracheal aspirate)     Status: None   Collection Time: 05/07/19 10:24 AM   Specimen: Tracheal Aspirate; Respiratory  Result Value Ref Range Status   Specimen Description TRACHEAL ASPIRATE  Final   Special Requests NONE  Final   Gram Stain NO WBC SEEN RARE BUDDING YEAST SEEN   Final   Culture   Final    Consistent with normal respiratory flora. Performed at [facility], [place] [address], [place], [place] [address]  Report Status 05/09/2019 FINAL  Final         Radiology Studies: Dg Chest Port 1 View  Result Date: 05/14/2019 CLINICAL DATA:  Hypoxia. EXAM: PORTABLE CHEST 1 VIEW COMPARISON:  05/13/2019.  05/06/2019.  04/13/2019. FINDINGS: Prior median sternotomy. Stable cardiomegaly. Persistent patchy bilateral pulmonary infiltrates. Worsening of the left upper lung infiltrate noted on today's exam. Underlying chronic interstitial disease most likely present. Small bilateral pleural effusions. No pneumothorax. IMPRESSION: 1. Persistent  patchy bilateral pulmonary infiltrates. Worsening of the left upper lung infiltrate noted on today's exam. Underlying chronic interstitial lung disease most likely present. Bilateral pleural effusions are unchanged. 2.  Prior median sternotomy.  Stable cardiomegaly. Electronically Signed   By: Marcello Moores  Register   On: 05/14/2019 07:16        Scheduled Meds:  apixaban  5 mg Oral BID   budesonide  1 puff Inhalation BID   calcitRIOL  0.25 mcg Oral Daily   Chlorhexidine Gluconate Cloth  6 each Topical Daily   diltiazem  240 mg Oral Daily   feeding supplement (GLUCERNA SHAKE)  237 mL Oral BID BM   Ipratropium-Albuterol  1 puff Inhalation Q6H   levETIRAcetam  500 mg Oral BID   magnesium oxide  800 mg Oral Daily   methylPREDNISolone (SOLU-MEDROL) injection  40 mg Intravenous Q12H   metoprolol tartrate  100 mg Oral BID   pantoprazole  40 mg Oral Daily   sodium chloride flush  3 mL Intravenous Q12H   Continuous Infusions:  sodium chloride       LOS: 9 days    Time spent: 40 minutes    Irine Seal, MD [facility]  If 7PM-7AM, please contact night-coverage [email or web address] 05/15/2019, 10:53 AM

## 2019-05-16 ENCOUNTER — Telehealth: Payer: Self-pay | Admitting: Pulmonary Disease

## 2019-05-16 DIAGNOSIS — J9601 Acute respiratory failure with hypoxia: Secondary | ICD-10-CM | POA: Diagnosis not present

## 2019-05-16 LAB — BASIC METABOLIC PANEL
Anion gap: 15 (ref 5–15)
BUN: 86 mg/dL — ABNORMAL HIGH (ref 8–23)
CO2: 23 mmol/L (ref 22–32)
Calcium: 8.6 mg/dL — ABNORMAL LOW (ref 8.9–10.3)
Chloride: 101 mmol/L (ref 98–111)
Creatinine, Ser: 3.44 mg/dL — ABNORMAL HIGH (ref 0.61–1.24)
GFR calc Af Amer: 20 mL/min — ABNORMAL LOW (ref >60)
GFR calc non Af Amer: 18 mL/min — ABNORMAL LOW (ref >60)
Glucose, Bld: 202 mg/dL — ABNORMAL HIGH (ref 70–99)
Potassium: 4.3 mmol/L (ref 3.5–5.1)
Sodium: 139 mmol/L (ref 135–145)

## 2019-05-16 LAB — CBC WITH DIFFERENTIAL/PLATELET
Abs Immature Granulocytes: 0.09 10*3/uL — ABNORMAL HIGH (ref 0.00–0.07)
Basophils Absolute: 0 10*3/uL (ref 0.0–0.1)
Basophils Relative: 0 %
Eosinophils Absolute: 0 10*3/uL (ref 0.0–0.5)
Eosinophils Relative: 0 %
HCT: 39.5 % (ref 39.0–52.0)
Hemoglobin: 12 g/dL — ABNORMAL LOW (ref 13.0–17.0)
Immature Granulocytes: 1 %
Lymphocytes Relative: 5 %
Lymphs Abs: 0.5 10*3/uL — ABNORMAL LOW (ref 0.7–4.0)
MCH: 29.8 pg (ref 26.0–34.0)
MCHC: 30.4 g/dL (ref 30.0–36.0)
MCV: 98 fL (ref 80.0–100.0)
Monocytes Absolute: 0.3 10*3/uL (ref 0.1–1.0)
Monocytes Relative: 3 %
Neutro Abs: 8.9 10*3/uL — ABNORMAL HIGH (ref 1.7–7.7)
Neutrophils Relative %: 91 %
Platelets: 256 10*3/uL (ref 150–400)
RBC: 4.03 MIL/uL — ABNORMAL LOW (ref 4.22–5.81)
RDW: 18.4 % — ABNORMAL HIGH (ref 11.5–15.5)
WBC: 9.8 10*3/uL (ref 4.0–10.5)
nRBC: 0.3 % — ABNORMAL HIGH (ref 0.0–0.2)

## 2019-05-16 LAB — MAGNESIUM: Magnesium: 1.8 mg/dL (ref 1.7–2.4)

## 2019-05-16 LAB — GLUCOSE, CAPILLARY
Glucose-Capillary: 185 mg/dL — ABNORMAL HIGH (ref 70–99)
Glucose-Capillary: 158 mg/dL — ABNORMAL HIGH (ref 70–99)
Glucose-Capillary: 170 mg/dL — ABNORMAL HIGH (ref 70–99)
Glucose-Capillary: 152 mg/dL — ABNORMAL HIGH (ref 70–99)

## 2019-05-16 MED ORDER — IPRATROPIUM-ALBUTEROL 20-100 MCG/ACT IN AERS
1.0000 | INHALATION_SPRAY | Freq: Four times a day (QID) | RESPIRATORY_TRACT | Status: DC
  Administered 2019-05-17 – 2019-05-20 (×13): 1 via RESPIRATORY_TRACT
  Filled 2019-05-16: qty 4

## 2019-05-16 MED ORDER — DILTIAZEM HCL ER COATED BEADS 300 MG PO CP24
300.0000 mg | ORAL_CAPSULE | Freq: Every day | ORAL | Status: DC
  Administered 2019-05-17 – 2019-05-21 (×4): 300 mg via ORAL
  Filled 2019-05-16 (×6): qty 1

## 2019-05-16 NOTE — Plan of Care (Signed)
  Problem: Education: Goal: Knowledge of General Education information will improve Description: Including pain rating scale, medication(s)/side effects and non-pharmacologic comfort measures Outcome: Progressing   Problem: Clinical Measurements: Goal: Respiratory complications will improve Outcome: Not Progressing   Problem: Nutrition: Goal: Adequate nutrition will be maintained Outcome: Progressing   Problem: Pain Managment: Goal: General experience of comfort will improve Outcome: Progressing   Problem: Safety: Goal: Ability to remain free from injury will improve Outcome: Progressing

## 2019-05-16 NOTE — Progress Notes (Signed)
   Vital Signs MEWS/VS Documentation      05/16/2019 0757 05/16/2019 0800 05/16/2019 0840 05/16/2019 0841   MEWS Score:  3  2  3  4    MEWS Score Color:  Yellow  Yellow  Yellow  Red   Resp:  (!) 22  (!) 24  -  -   Pulse:  -  (!) 111  -  -   BP:  (!) 127/91  (!) 139/113  -  -   Temp:  -  -  (!) 96.7 F (35.9 C)  (!) 94.5 F (34.7 C)      Patient temp axillary 96.61F oral 94.61F. Notified Dr. Grandville Silos via Shea Evans. Pt is resting and in no distress at this time. Will continue to monitor patient.      Drew Lips 05/16/2019,8:57 AM

## 2019-05-16 NOTE — Progress Notes (Signed)
MEWS remains yellow secondary to HR.    HR 118 BP 130/97 RR 20 Temp 97.4 sats 93%  Pt denies needs, in no acute distress.  Denies pain, SOB.  Not an acute change for Pt.  Will continue to monitor.  Charge RN aware, MD previously notified with no new orders.

## 2019-05-16 NOTE — Telephone Encounter (Signed)
Please arrange for an appointment for patient to be seen by Dr. Vaughan Browner in one month for ILD follow up.    Noe Gens, NP-C [facility] Pgr: [phone] or if no answer [phone] 05/16/2019, 10:32 AM

## 2019-05-16 NOTE — Progress Notes (Addendum)
NAME:  Dylan Goodman, MRN:  [id number], DOB:  26-Feb-1951, LOS: 61 ADMISSION DATE:  2019/05/17, CONSULTATION DATE:  05-17-23 REFERRING MD:  Dr Jimmye Norman [facility] EDP, CHIEF COMPLAINT:  Cardiac arrest   Brief History   68 year old male recently admitted with COVID PNA 9/13 now presenting with seizure and subsequently suffered brief cardiac arrest in the [facility] ED. Intubated and transferred to [facility].   Extubated.  Reconsulted 10/15 for worsening respiratory status.  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  Significant Hospital Events   9/14-9/19 admit to [facility] for COVID pneumonia.  10/05 to ED for Chest pain. Discharged home after negative cardiac. 10/06 To ED for seizure 05/17/23 Coded in ED while awaiting placement 10/15 PCCM called back  10/17 Remains on 100%, 30L flow  Consults:  Neurology Cardiology Nephrology  Procedures:  ETT 05/17/2023 >> 10/8  Significant Diagnostic Tests:  Renal US 9/14 >> WNL Lower extremity doppler May 17, 2023 >> CT head 10/6 >> neg CXR 10/15 >> ILD, possible some volume overload Echo 10/0 >> LVEF 45-50%, global normal RV systolic function, LA severely dilated, RA moderately dilated, mild MVR, mild TR, moderately elevated pulmonary artery systolic pressure    Micro Data:  COVID 10/6 > POSITIVE Blood May 17, 2023 > neg Urine 2023-05-17 > neg Trach aspirate 05/17/23 > normal flora   Antimicrobials:  Zosyn 2023-05-17 > 10/11  Interim history/subjective:   Pt reports he is angry because he has not received his breakfast this am.  Reports he is hungry. Remains on 30L/100% HFNC.  Sats 94% while in room. Reports cough but non-productive. RN reports hypothermia > temp of 94, repeat rectal with 97.5.  I/O - 2.5L UOP in 24/h, net neg balance for 24h  Objective   Blood pressure (!) 138/103, pulse (!) 104, temperature 97.6 F (36.4 C), temperature source Oral, resp. rate (!) 21, height 5\' 11"  (1.803 m), weight 95.3 kg, SpO2 (!) 89 %.       FiO2 (%):  [100 %] 100 %   Intake/Output Summary (Last 24 hours) at 05/16/2019 1017 Last data filed at 05/16/2019 6967 Gross per 24 hour  Intake -  Output 2875 ml  Net -2875 ml   Filed Weights   05/15/19 0413 05/15/19 2100 05/16/19 0712  Weight: 96 kg 96 kg 95.3 kg    Examination:  General: adult male, appears older than stated age 53: MM pink/moist, no jvd Neuro: AAOx4, speech clear, MAE CV: s1s2 irr irr, AF on monitor, no m/r/g PULM:  Non-labored, lungs bilaterally diminished  GI: soft, bsx4 active  Extremities: warm/dry, no edema  Skin: no rashes or lesions  Resolved Hospital Problem list   N/A  Assessment & Plan:  68 year old male with diagnosis of COVID 19 over a month ago presenting initially with cardiac arrest thought due to arrhythmia.  Weaned from vent a week ago.  Now having worsening respiratory distress.  Acute hypoxemic respiratory failure  Initially after cardiac arrest now worsening with wide differential.   Prior to the COVID diagnosis he was being worked up for worsening DOE at [facility] pulmonary.  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  VQ scan neg 03/18/19.  CXR with worsened interstitial changes that could just be due to edema or the COVID caused an additional insult on top of patient's existing disease.  Also had amiodarone further complicating clinical picture.  I see no obvious evidence of infection.  Given this, would treat as inflammatory and fluid.  Prior SLP  eval WNL. -continue pulmicort  -continue combivent  -solumedrol 40 mg IV Q12 -repeat lasix 40 mg IV x1  -mobilize, incentive spirometry  -wean O2 for sats > 88% -pt will need outpatient follow up with pulmonary (Dr. Vaughan Browner)  Possible seizure now on keppra  Afib  -Per Cardiology  -continue eliquis, cardizem, lopressor  -avoid further amiodarone if possible  Persistent COVID colonization  Uncertain clinical significance -isolation per infection prevention   DM2 -per primary   CHF  -per primary   Deconditioning  From recurrent admissions, critical illness .   PCCM will follow up on 10/19. Please call sooner if new needs arise.   Noe Gens, NP-C [facility] Pgr: [phone] or if no answer [phone] 05/16/2019, 10:29 AM  Attending note: I have seen and examined the patient. History, labs and imaging reviewed.  Dylan Goodman is a 68 year old with COVID-19 pneumonia in September.  Treated with room December, dexamethasone.  Readmitted on 10/7 with seizure, brief cardiac arrest, intubated and was briefly in the ICU.   PCCM called back on 10/15 for increasing O2 requirements.  Started on Solu-Medrol at that point.  Continues on high flow nasal cannula.  No acute issues.  Blood pressure (!) 133/95, pulse (!) 107, temperature (!) 97.5 F (36.4 C), temperature source Axillary, resp. rate 18, height 5\' 11"  (1.803 m), weight 95.3 kg, SpO2 95 %. Gen:      No acute distress HEENT:  EOMI, sclera anicteric Neck:     No masses; no thyromegaly Lungs:    Clear to auscultation bilaterally; normal respiratory effort CV:         Regular rate and rhythm; no murmurs Abd:      + bowel sounds; soft, non-tender; no palpable masses, no distension Ext:    No edema; adequate peripheral perfusion Skin:      Warm and dry; no rash Neuro: alert and oriented x 3 Psych: normal mood and affect   Labs/Imaging personally reviewed, significant for High-resolution CT 03/24/19-no  evidence of interstitial lung disease.  Emphysema, bronchial wall thickening.  Chest x-ray 10/15-persistent patchy bilateral pulmonary infiltrates.  Possible underlying chronic interstitial lung disease.  I have reviewed the images personally.  Assessment/plan: Acute hypoxic respiratory failure, post COVID-19 pneumonia in September Chest x-ray still shows persistent infiltrates.  Suspect post COVID 19 pneumonitis, may be developing fibrosis Prior CT high-res CT does not show any significant evidence of interstitial lung disease  Agree with Solu-Medrol, avoid amiodarone We will need follow-up as outpatient and high-res CT for better evaluation of interstitial lung disease.  We will check back on Monday.  Please call over the weekend with any questions.  Marshell Garfinkel MD [facility] 05/16/2019, 4:13 PM

## 2019-05-16 NOTE — Progress Notes (Signed)
PROGRESS NOTE  Dylan Goodman BTD:[id number] DOB: 08/20/1950 DOA: 05/06/2019 PCP: Steele Sizer, MD  Brief History   Patient is a 68 year old male with history of atrial fibrillation, GI bleed, coronary artery disease status post CABG, nasopharyngeal cancer, cardiomyopathy, chronic depression, CKD stage 4, diabetes mellitus, GI bleed , peripheral vascular disease, previous smoker, recent Covid-19 infection about 3 weeks ago before admission who presented to [facility] with seizure. He sustained a cardiac arrest there in the emergency department after an seizure episode. Due to Covid positivity he was sent to [facility] and was admitted under PCCM service. He was defibrillated after cardiac arrest. Intubated for airway protection. Currently extubated and maintaining his saturation on nasal cannula. Patient transferred to [facility] service on 05/08/19. Hospital course remarkable for development of AKI on CKD,Afib with RVR.Cardiology,nephrology following. Hisoverall status is improving.  Consultants   PCCM  Cardiology  Procedures   Intubation/extubation  Antibiotics   Anti-infectives (From admission, onward)   Start     Dose/Rate Route Frequency Ordered Stop   05/09/19 1000  piperacillin-tazobactam (ZOSYN) IVPB 2.25 g  Status:  Discontinued     2.25 g 100 mL/hr over 30 Minutes Intravenous Every 8 hours 05/09/19 0833 05/10/19 1346   05/08/19 1000  piperacillin-tazobactam (ZOSYN) IVPB 3.375 g  Status:  Discontinued     3.375 g 12.5 mL/hr over 240 Minutes Intravenous Every 12 hours 05/08/19 0930 05/09/19 0833   05/08/19 0630  piperacillin-tazobactam (ZOSYN) IVPB 3.375 g  Status:  Discontinued     3.375 g 12.5 mL/hr over 240 Minutes Intravenous Every 12 hours 05/07/19 2040 05/08/19 0930   05/07/19 2200  vancomycin (VANCOCIN) 1,750 mg in sodium chloride 0.9 % 500 mL IVPB  Status:  Discontinued     1,750 mg 250 mL/hr over 120 Minutes Intravenous Every 48 hours 05/06/19 1912 05/07/19 1039    05/07/19 2200  vancomycin (VANCOCIN) 1,500 mg in sodium chloride 0.9 % 500 mL IVPB  Status:  Discontinued     1,500 mg 250 mL/hr over 120 Minutes Intravenous Every 48 hours 05/07/19 1039 05/07/19 1228   05/07/19 0200  piperacillin-tazobactam (ZOSYN) IVPB 3.375 g  Status:  Discontinued     3.375 g 12.5 mL/hr over 240 Minutes Intravenous Every 8 hours 05/06/19 1853 05/07/19 2040   05/06/19 1930  piperacillin-tazobactam (ZOSYN) IVPB 3.375 g     3.375 g 100 mL/hr over 30 Minutes Intravenous  Once 05/06/19 1853 05/06/19 2156      Subjective  The patient is resting comfortably. No new complaints.  Objective   Vitals:  Vitals:   05/16/19 1400 05/16/19 1449  BP: 122/90   Pulse: (!) 103   Resp: 15   Temp:    SpO2: 94% 97%   Exam:  Constitutional:   The patient is awake, alert, and oriented x 3. No acute distress. Respiratory:   Mildly increased work of breathing with accessory muscle use  No wheezes, rales, or rhonchi  No tactile fremitus Cardiovascular:   Irregularly irregular, tachycardic  No murmurs, ectopy, or gallups.  No lateral PMI. No thrills. Abdomen:   Abdomen is soft, non-tender, non-distended  No hernias, masses, or organomegaly  Normoactive bowel sounds.  Musculoskeletal:   No cyanosis, clubbing, or edema Skin:   No rashes, lesions, ulcers  palpation of skin: no induration or nodules Neurologic:   CN 2-12 intact  Sensation all 4 extremities intact Psychiatric:   Mental status o Mood, affect appropriate o Orientation to person, place, time   judgment and insight appear intact  I have personally reviewed the following:   Today's Data   Vitals, BMP, CBC   Scheduled Meds:  apixaban  5 mg Oral BID   budesonide  1 puff Inhalation BID   calcitRIOL  0.25 mcg Oral Daily   Chlorhexidine Gluconate Cloth  6 each Topical Daily   diltiazem  240 mg Oral Daily   feeding supplement (GLUCERNA SHAKE)  237 mL Oral TID BM   insulin  aspart  0-9 Units Subcutaneous TID WC   Ipratropium-Albuterol  1 puff Inhalation Q6H   levETIRAcetam  500 mg Oral BID   magnesium oxide  800 mg Oral Daily   methylPREDNISolone (SOLU-MEDROL) injection  40 mg Intravenous Q12H   metoprolol tartrate  100 mg Oral BID   pantoprazole  40 mg Oral Daily   sodium chloride flush  3 mL Intravenous Q12H   Continuous Infusions:  sodium chloride      Active Problems:   Acute kidney injury superimposed on chronic kidney disease (HCC)   Atrial fibrillation with RVR (HCC)   Acute respiratory failure with hypoxemia (HCC)   Cardiac arrest (HCC)   Prolonged QT interval   Seizure (HCC)   LOS: 10 days   A & P   Status post cardiac arrest: It is noted that patient's initial rhythm was reported as V. tach with conversion to A. fib with RVR and subsequently defibrillated.  Patient noted to be down for estimated 11 minutes.  Patient currently hemodynamically stable.  2D echo done 05/08/2019 with a EF of 40 to 45%.  Patient being followed by cardiology.  Continue current cardiac regimen of metoprolol.  Diltiazem added to regimen for better rate control.  Per cardiology. I have increased dose of diltiazem to better control hypertension and tachycardia.  New onset A. fib with RVR: Patient was noted to have been taken off the diltiazem drip on 05/12/2019, and placed on metoprolol. Patient with heart rate sustained in the 120s to 130s per RN the morning of 05/13/2019. Oral Cardizem added to patient's current regimen of metoprolol for better rate control.  Eliquis for anticoagulation. Keep magnesium greater than 2. Keep potassium greater than 4. Appreciate cardiology input and recommendations. Diltiazem CD increased to 300 mg to better control rate and hypertension.   Acute kidney injury on chronic kidney disease stage IV: Baseline creatinine noted to be approximately 2.5-3.  Acute kidney injury felt secondary to ATN due to hypotension secondary to cardiac  arrest.  Patient also noted to have a metabolic acidosis.  Patient was on a bicarb drip which is subsequently been discontinued.  Patient with a urine output of 1.075 L over the past 24 hours.  Creatinine currently at 3.44. It has fluctuated from 3.84 from 3.69 from 5.17 on 05/09/2019.  Nephrology following and per nephrology no indication for dialysis at this time. Monitor creatinine, electrolytes, and volume status.  Metabolic acidosis: Resolved. Likely secondary to AKI.  Improved with bicarb drip which has now been discontinued.  Per nephrology.  Acute hypoxic respiratory failure/COPD exacerbation: Patient was admitted and intubated on admission for airway protection.  Patient noted not to be on O2 at home.  Chest x-ray 05/07/2019 with stable bibasilar opacities.  Patient with worsening hypoxia and worsening shortness of breath with use of accessory muscles of respiration and was on 10 L nonrebreather the morning of 05/14/2019.  Underlying chronic interstitial lung disease most likely.  Bilateral pleural effusions unchanged. This morning the patient is saturating in the mid-nineties on High Flow nasal cannula with 30L  O2. Patient was COVID-19 +10 days ago. Patient given Lasix 40 mg IV x1 yesterday with urine output of 1.075 L over the past 24 hours. PCCM following and appreciate input and recommendations.   Recent COVID-19 infection: Patient diagnosed with COVID-19 approximately 3 weeks ago.  COVID-19 test on 05/04/2019 was positive.  Patient has not required any COVID specific treatment at this time.  Patient now with some acute respiratory distress with increased O2 requirements now on 10 L.  Chest x-ray done the morning of 05/14/2019, with persistent patchy bilateral pulmonary infiltrates worsening of the left upper lung infiltrate.  Underlying chronic interstitial lung disease most likely present.  Bilateral pleural effusions unchanged.  ABG done with a pH of 7.346, PCO2 of 41, PO2 of 55.  Ferritin  elevated but trending down.  Procalcitonin trending down.  CRP trending down.  PCCM has reassessed patient and patient started on IV steroids due to concern for probable COPD exacerbation.Patient has been reassessed by critical care medicine and patient started on IV Solu-Medrol per PCCM for COPD exacerbation.  Continue current airborne precautions.  Continue supportive care.   Hypotension: Resolved. Patient had required a Levophed drip due to hypotension.  Blood pressure currently stable off drip.  Patient now on oral metoprolol and Cardizem.  Follow.  Transaminitis: Felt secondary to shock liver. Resolving. Follow.  Seizure: Patient had presented with seizures. Patient noted to have a seizure episode in the emergency room and started on Keppra. Continue Keppra.  Neurology was following.  Will need outpatient follow-up with neurology.  Deconditioning/debility: Evaluated by PT OT. Inpatient rehab assessing.  Hypokalemia/hypomagnesemia: Magnesium at 1.8.  Potassium at 4.3. Supplement magnesium and follow.  I have seen and examined this patient myself. I have spent 32 minutes in his evaluation and care.  DVT prophylaxis: Eliquis Code Status: Full Family Communication: Updated patient.  No family at bedside. Disposition Plan: Home once A. fib is well controlled, clinical improvement, improvement with hypoxia and when cleared by nephrology and cardiology and critical care medicine.  Dylan Dommer, DO [facility] Direct contact: see [email or web address]  7PM-7AM contact night coverage as above 05/16/2019, 2:53 PM  LOS: 10 days

## 2019-05-16 NOTE — Progress Notes (Signed)
   Vital Signs MEWS/VS Documentation      05/16/2019 0258 05/16/2019 0413 05/16/2019 0757 05/16/2019 0800   MEWS Score:  1  1  3  2    MEWS Score Color:  Green  Green  Yellow  Yellow   Resp:  16  -  (!) 22  (!) 24   Pulse:  (!) 105  -  -  (!) 111   BP:  (!) 133/97  -  (!) 127/91  (!) 139/113   Temp:  97.8 F (36.6 C)  -  -  -   O2 Device:  HFNC  -  -  -   O2 Flow Rate (L/min):  30 L/min  -  -  -   FiO2 (%):  100 %  -  -  -   Level of Consciousness:  -  Alert  -  -       This is not an acute change for this patient. Patient is A&O x 3 and denies chest pain or SOB. Resting comfortably in bed in no distress. Will continue to monitor.    Odelle Kosier 05/16/2019,8:19 AM

## 2019-05-16 NOTE — Progress Notes (Signed)
   Vital Signs MEWS/VS Documentation      05/16/2019 1020 05/16/2019 1100 05/16/2019 1129 05/16/2019 1200   MEWS Score:  1  2  2  2    MEWS Score Color:  Green  Yellow  Yellow  Yellow   Resp:  19  (!) 24  20  15    Pulse:  (!) 101  (!) 113  (!) 103  (!) 105   BP:  -  (!) 137/94  (!) 127/96  (!) 133/97   Temp:  (!) 97.4 F (36.3 C)  98.6 F (37 C)  (!) 97.5 F (36.4 C)  (!) 96.8 F (36 C)   O2 Device:  -  HFNC  HFNC  HFNC      Mews guidelines implemented, patient is not in any distress, A&Ox4. Physician is aware of vitals. No acute change in HR or RR for this patient. Will continue to monitor patient closely.      Dylan Goodman 05/16/2019,12:55 PM

## 2019-05-16 NOTE — Progress Notes (Signed)
MEWS yellow 2/2 elevate HR.  Provider notified via page.  Pt assessed, in no visible distress.  Denies SOB, pain, Face Timing with family.  Denies needs.  Will follow closely.

## 2019-05-16 NOTE — Progress Notes (Signed)
Not an acute change for this patient. Will continue to monitor.

## 2019-05-17 LAB — GLUCOSE, CAPILLARY
Glucose-Capillary: 189 mg/dL — ABNORMAL HIGH (ref 70–99)
Glucose-Capillary: 174 mg/dL — ABNORMAL HIGH (ref 70–99)
Glucose-Capillary: 203 mg/dL — ABNORMAL HIGH (ref 70–99)

## 2019-05-17 NOTE — Progress Notes (Signed)
PROGRESS NOTE  Dylan Goodman NFA:[id number] DOB: Sep 07, 1950 DOA: 05/06/2019 PCP: Steele Sizer, MD  Brief History   Patient is a 68 year old male with history of atrial fibrillation, GI bleed, coronary artery disease status post CABG, nasopharyngeal cancer, cardiomyopathy, chronic depression, CKD stage 4, diabetes mellitus, GI bleed , peripheral vascular disease, previous smoker, recent Covid-19 infection about 3 weeks ago before admission who presented to [facility] with seizure. He sustained a cardiac arrest there in the emergency department after an seizure episode. Due to Covid positivity he was sent to [facility] and was admitted under PCCM service. He was defibrillated after cardiac arrest. Intubated for airway protection. Currently extubated and maintaining his saturation on nasal cannula. Patient transferred to [facility] service on 05/08/19. Hospital course remarkable for development of AKI on CKD,Afib with RVR.Cardiology,nephrology following. Hisoverall status is improving.   Subjective    The patient was seen and examined this morning, remained stable Mildly tachycardic heart rate ranging 98-1 19 satting 90% on high flow oxygen Awake alert following some commands, mild tachypneic respiratory rate 19-28 Blood pressure remained stable currently 133/98 No issues overnight   A & P   Active Problems:   Acute kidney injury superimposed on chronic kidney disease (HCC)   Atrial fibrillation with RVR (HCC)   Acute respiratory failure with hypoxemia (HCC)   Cardiac arrest (HCC)   Prolonged QT interval   Seizure (HCC)     Status post cardiac arrest:  -Remained stable today, no acute distress It is noted that patient's initial rhythm was reported as V. tach with conversion to A. fib with RVR and subsequently defibrillated.  Patient noted to be down for estimated 11 minutes.  Patient currently hemodynamically stable.   -2D echo done 05/08/2019 with a EF of 40 to 45%.  Patient  being followed by cardiology.  Continue current cardiac regimen of metoprolol.  Diltiazem added to regimen for better rate control.  Per cardiology. I have increased dose of diltiazem to better control hypertension and tachycardia.  New onset A. fib with RVR: -Remained stable -Cardiology following Patient was noted to have been taken off the diltiazem drip on 05/12/2019, and placed on metoprolol.  Patient with heart rate sustained in the 120s to 130s per RN the morning of 05/13/2019.  -Oral Cardizem CD 300 mg p.o. daily, metoprolol and Eliquis to be continued -Monitoring magnesium and potassium Keep magnesium greater than 2. Keep potassium greater than 4.  -Appreciate cardiology input and recommendations.     Acute kidney injury on chronic kidney disease stage IV:  Baseline creatinine noted to be approximately 2.5-3.  -Acute on chronic kidney disease, likely due to ATN, hypotension, cardiac arrest, metabolic acidosis, -Status post bicarb drip,  Monitoring BUN/creatinine, I's and O's - Creatinine currently at 3.44. It has fluctuated from 3.84 from 3.69 from 5.17 on 05/09/2019.   Nephrology following and per nephrology no indication for dialysis at this time.     Metabolic acidosis:  -Resolved, stable  Likely secondary to AKI.  Improved with bicarb drip which has now been discontinued.  Per nephrology.  Acute hypoxic respiratory failure/COPD exacerbation:  -Remained stable, 90% on high flow oxygen  Patient was admitted and intubated on admission for airway protection.  Patient noted not to be on O2 at home.  Chest x-ray 05/07/2019 with stable bibasilar opacities.  Patient with worsening hypoxia and worsening shortness of breath with use of accessory muscles of respiration and was on 10 L nonrebreather the morning of 05/14/2019.   Underlying chronic interstitial  lung disease most likely.  Bilateral pleural effusions unchanged. This morning the patient is saturating in the mid-nineties  on High Flow nasal cannula with 30L O2.  Patient was COVID-19 +10 days ago. Patient given Lasix 40 mg IV x1 on 05/15/2019   PCCM following and appreciate input and recommendations.   Recent COVID-19 infection:  -Remains respiratory distress, needing high flow oxygen satting in low 90s  Patient diagnosed with COVID-19 approximately 3 weeks ago.  COVID-19 test on 05/04/2019 was positive.  Patient has not required any COVID specific treatment at this time.  Patient now with some acute respiratory distress with increased O2 requirements now on 10 L.  Chest x-ray done the morning of 05/14/2019, with persistent patchy bilateral pulmonary infiltrates worsening of the left upper lung infiltrate.  Underlying chronic interstitial lung disease most likely present.  Bilateral pleural effusions unchanged.  ABG done with a pH of 7.346, PCO2 of 41, PO2 of 55.  Ferritin elevated but trending down.  Procalcitonin trending down.  CRP trending down.  PCCM has reassessed patient and patient started on IV steroids due to concern for probable COPD exacerbation.Patient has been reassessed by critical care medicine and patient started on IV Solu-Medrol per PCCM for COPD exacerbation.  Continue current airborne precautions.  Continue supportive care.   COVID-19 Labs  Recent Labs    05/14/19 1516 05/15/19 0539  FERRITIN 637* 409*  CRP 16.8* 14.2*    Lab Results  Component Value Date   SARSCOV2NAA POSITIVE (A) 05/04/2019   SARSCOV2NAA POSITIVE (A) 04/12/2019   [place] Not Detected 04/02/2019   [employer] NEGATIVE 03/12/2019      Hypotension: -Improved, stable BP now  Resolved. Patient had required a Levophed drip due to hypotension.  Blood pressure currently stable off drip.  Patient now on oral metoprolol and Cardizem.  Follow.  Transaminitis:  Felt secondary to shock liver. Resolving. Follow.  Seizure: -Stable on Keppra   Patient had presented with seizures. Patient noted to have a seizure  episode in the emergency room and started on Keppra. Continue Keppra.  Neurology was following.  Will need outpatient follow-up with neurology.  Deconditioning/debility: Evaluated by PT OT. Inpatient rehab assessing.  Hypokalemia/hypomagnesemia:  -Monitoring and repeating accordingly   I have seen and examined this patient myself. I have spent 32 minutes in his evaluation and care.  DVT prophylaxis: Eliquis Code Status: Full Family Communication: Updated patient.  No family at bedside. Disposition Plan: Home once A. fib is well controlled, clinical improvement, improvement with hypoxia and when cleared by nephrology and cardiology and critical care medicine.        Consultants  . PCCM . Cardiology  Procedures  . Intubation/extubation  Antibiotics   Anti-infectives (From admission, onward)   Start     Dose/Rate Route Frequency Ordered Stop   05/09/19 1000  piperacillin-tazobactam (ZOSYN) IVPB 2.25 g  Status:  Discontinued     2.25 g 100 mL/hr over 30 Minutes Intravenous Every 8 hours 05/09/19 0833 05/10/19 1346   05/08/19 1000  piperacillin-tazobactam (ZOSYN) IVPB 3.375 g  Status:  Discontinued     3.375 g 12.5 mL/hr over 240 Minutes Intravenous Every 12 hours 05/08/19 0930 05/09/19 0833   05/08/19 0630  piperacillin-tazobactam (ZOSYN) IVPB 3.375 g  Status:  Discontinued     3.375 g 12.5 mL/hr over 240 Minutes Intravenous Every 12 hours 05/07/19 2040 05/08/19 0930   05/07/19 2200  vancomycin (VANCOCIN) 1,750 mg in sodium chloride 0.9 % 500 mL IVPB  Status:  Discontinued  1,750 mg 250 mL/hr over 120 Minutes Intravenous Every 48 hours 05/06/19 1912 05/07/19 1039   05/07/19 2200  vancomycin (VANCOCIN) 1,500 mg in sodium chloride 0.9 % 500 mL IVPB  Status:  Discontinued     1,500 mg 250 mL/hr over 120 Minutes Intravenous Every 48 hours 05/07/19 1039 05/07/19 1228   05/07/19 0200  piperacillin-tazobactam (ZOSYN) IVPB 3.375 g  Status:  Discontinued     3.375 g 12.5  mL/hr over 240 Minutes Intravenous Every 8 hours 05/06/19 1853 05/07/19 2040   05/06/19 1930  piperacillin-tazobactam (ZOSYN) IVPB 3.375 g     3.375 g 100 mL/hr over 30 Minutes Intravenous  Once 05/06/19 1853 05/06/19 2156     Subjective  The patient is resting comfortably. No new complaints.  Objective   Vitals:  Vitals:   05/17/19 1211 05/17/19 1335  BP: (!) 133/98   Pulse: 100 98  Resp: (!) 24 (!) 28  Temp: 98.4 F (36.9 C)   SpO2: 91% 90%   Exam: BP (!) 133/98   Pulse 98   Temp 98.4 F (36.9 C) (Oral)   Resp (!) 28   Ht 5\' 11"  (1.803 m)   Wt 95.3 kg   SpO2 90%   BMI 29.30 kg/m    Physical Exam  Constitution:  Alert, cooperative, no distress,  Psychiatric: Minimal communication, withdrawn HEENT: Normocephalic, PERRL, otherwise with in Normal limits  Chest:Chest symmetric Cardio vascular:  S1/S2, RRR, No murmure, No Rubs or Gallops  pulmonary: Clear to auscultation bilaterally, respirations unlabored, negative wheezes / crackles Abdomen: Soft, non-tender, non-distended, bowel sounds,no masses, no organomegaly Muscular skeletal: Limited exam - in bed, able to move all 4 extremities, Normal strength,  Neuro: CNII-XII intact. , normal motor and sensation, reflexes intact  Extremities: No pitting edema lower extremities, +2 pulses  Skin: Dry, warm to touch, negative for any Rashes, No open wounds Wounds: per nursing documentation     I have personally reviewed the following:   Today's Data  . Vitals, BMP, CBC   Scheduled Meds: . apixaban  5 mg Oral BID  . budesonide  1 puff Inhalation BID  . calcitRIOL  0.25 mcg Oral Daily  . Chlorhexidine Gluconate Cloth  6 each Topical Daily  . diltiazem  300 mg Oral Daily  . feeding supplement (GLUCERNA SHAKE)  237 mL Oral TID BM  . insulin aspart  0-9 Units Subcutaneous TID WC  . Ipratropium-Albuterol  1 puff Inhalation Q6H  . levETIRAcetam  500 mg Oral BID  . magnesium oxide  800 mg Oral Daily  .  methylPREDNISolone (SOLU-MEDROL) injection  40 mg Intravenous Q12H  . metoprolol tartrate  100 mg Oral BID  . pantoprazole  40 mg Oral Daily  . sodium chloride flush  3 mL Intravenous Q12H   Continuous Infusions: . sodium chloride      Active Problems:   Acute kidney injury superimposed on chronic kidney disease (HCC)   Atrial fibrillation with RVR (HCC)   Acute respiratory failure with hypoxemia (HCC)   Cardiac arrest (HCC)   Prolonged QT interval   Seizure ([place])   LOS: 11 days     SIGNED: Deatra Chaston, MD, FACP, FHM. [facility],  Pager [phone]  If 7PM-7AM, please contact night-coverage [email or web address], Password [facility] 05/17/2019, 2:18 PM

## 2019-05-18 ENCOUNTER — Inpatient Hospital Stay (HOSPITAL_COMMUNITY): Payer: Medicare Other

## 2019-05-18 DIAGNOSIS — I4891 Unspecified atrial fibrillation: Secondary | ICD-10-CM

## 2019-05-18 LAB — GLUCOSE, CAPILLARY
Glucose-Capillary: 160 mg/dL — ABNORMAL HIGH (ref 70–99)
Glucose-Capillary: 182 mg/dL — ABNORMAL HIGH (ref 70–99)
Glucose-Capillary: 185 mg/dL — ABNORMAL HIGH (ref 70–99)
Glucose-Capillary: 193 mg/dL — ABNORMAL HIGH (ref 70–99)
Glucose-Capillary: 196 mg/dL — ABNORMAL HIGH (ref 70–99)
Glucose-Capillary: 200 mg/dL — ABNORMAL HIGH (ref 70–99)
Glucose-Capillary: 216 mg/dL — ABNORMAL HIGH (ref 70–99)

## 2019-05-18 LAB — BASIC METABOLIC PANEL
Anion gap: 15 (ref 5–15)
BUN: 97 mg/dL — ABNORMAL HIGH (ref 8–23)
CO2: 24 mmol/L (ref 22–32)
Calcium: 9.1 mg/dL (ref 8.9–10.3)
Chloride: 103 mmol/L (ref 98–111)
Creatinine, Ser: 3.09 mg/dL — ABNORMAL HIGH (ref 0.61–1.24)
GFR calc Af Amer: 23 mL/min — ABNORMAL LOW (ref >60)
GFR calc non Af Amer: 20 mL/min — ABNORMAL LOW (ref >60)
Glucose, Bld: 228 mg/dL — ABNORMAL HIGH (ref 70–99)
Potassium: 5 mmol/L (ref 3.5–5.1)
Sodium: 142 mmol/L (ref 135–145)

## 2019-05-18 LAB — MAGNESIUM: Magnesium: 1.7 mg/dL (ref 1.7–2.4)

## 2019-05-18 IMAGING — CT CT CHEST HIGH RESOLUTION W/O CM
2 of 5 series · 13 of 36 positions shown, 16 images · non-contrast
Comparison: [DATE] chest radiograph. [DATE] high-resolution
chest CT.

CLINICAL DATA: Inpatient. Hospitalization for [3R] pneumonia
[DATE]. Persistent respiratory distress and readmitted with
cardiac arrest.

EXAM:
CT CHEST WITHOUT CONTRAST
TECHNIQUE: Multidetector CT imaging of the chest was performed following the
standard protocol without intravenous contrast. High resolution
imaging of the lungs, as well as inspiratory and expiratory imaging,
was performed.

[Series 8: coronal · coronal · 0.61mm/px · 3 of 149 slices shown]
[im 30/149  lung]
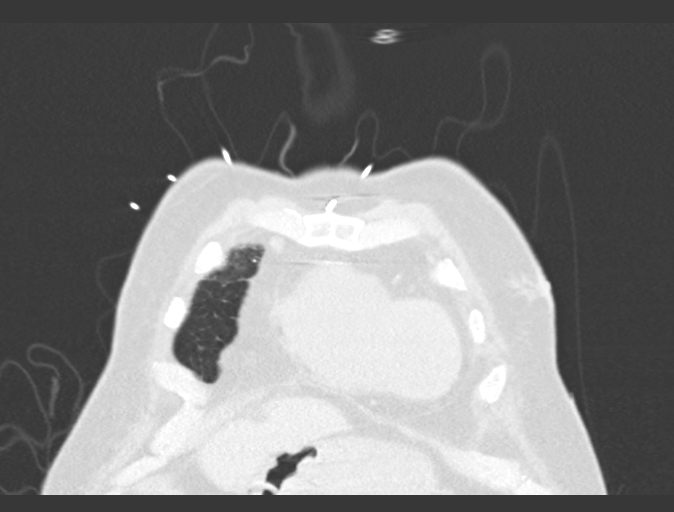
[im 60/149  lung]
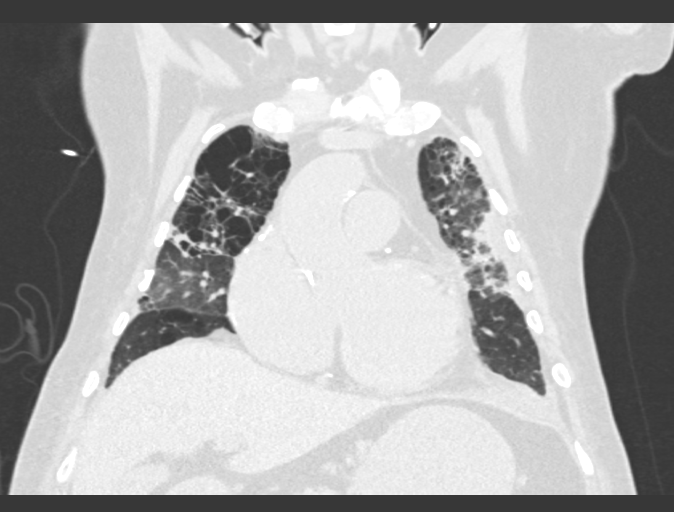
[im 89/149  lung]
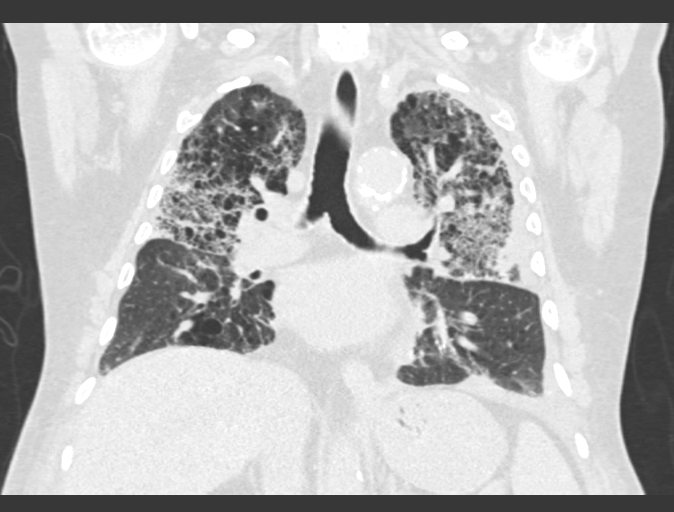

[Series 13: high resolution 2.0 b30f · axial · 0.77mm/px · z∈[+1105,+1339]mm · 10 of 143 slices shown, 13 images]
[im 13/143  mediastinal]
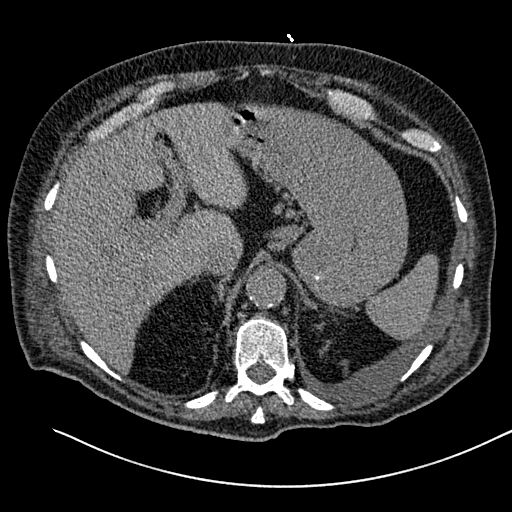
[im 13/143  lung]
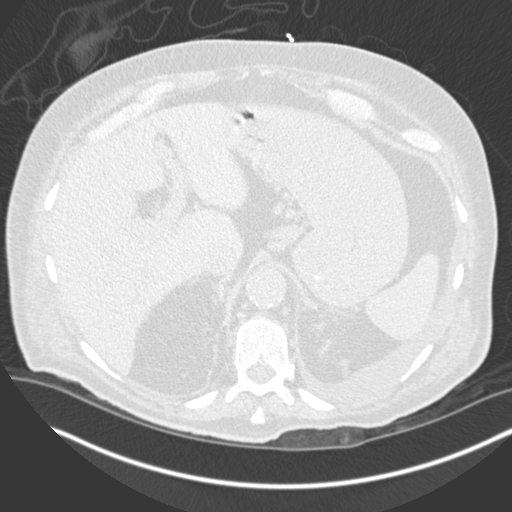
[im 25/143  lung]
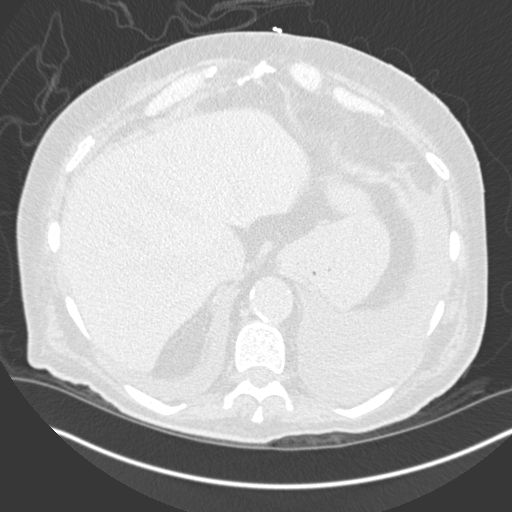
[im 38/143  lung]
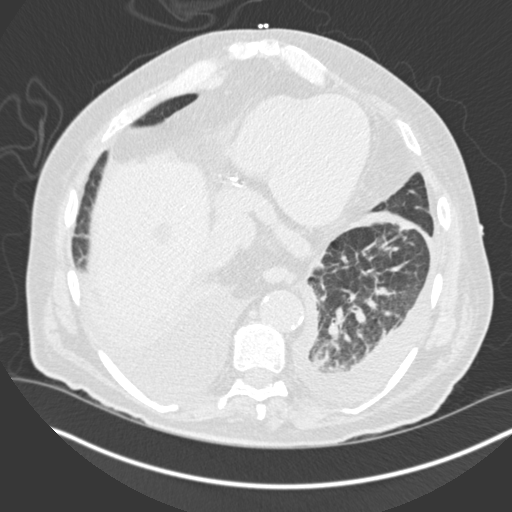
[im 50/143  lung]
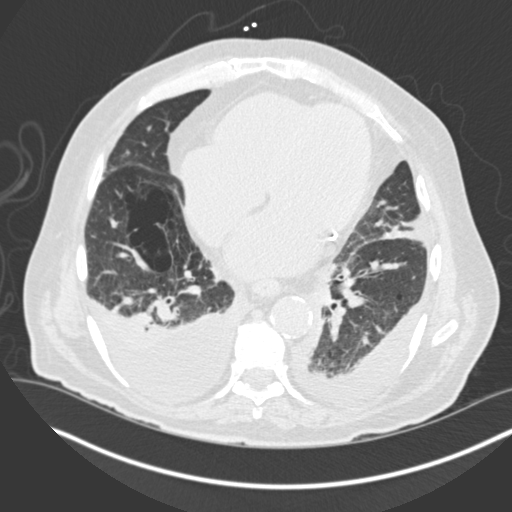
[im 62/143  mediastinal]
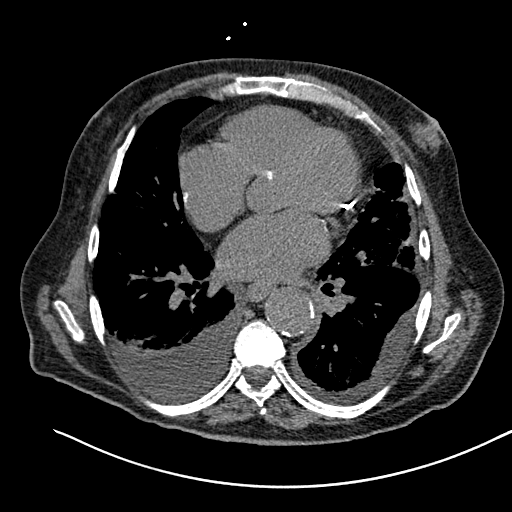
[im 62/143  lung]
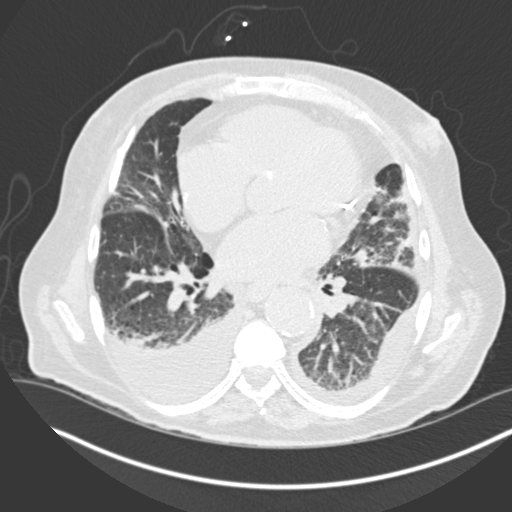
[im 81/143  lung]
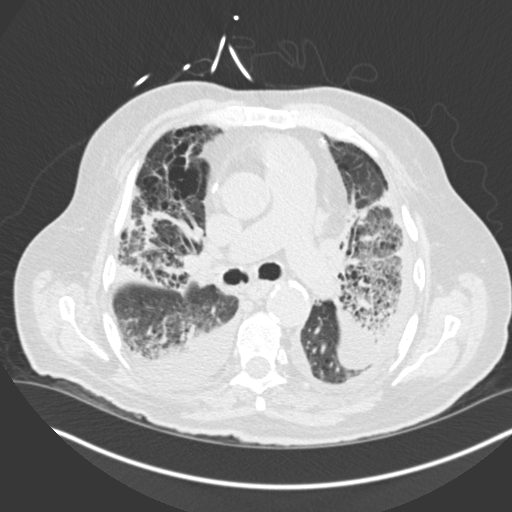
[im 93/143  lung]
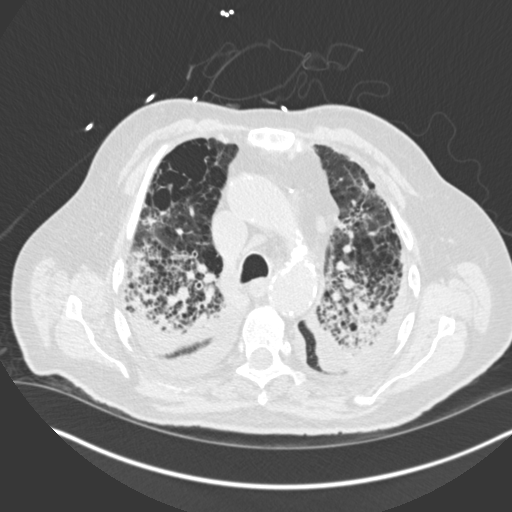
[im 105/143  lung]
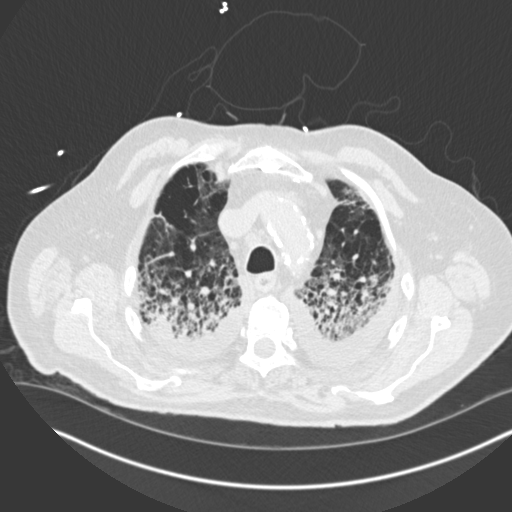
[im 118/143  mediastinal]
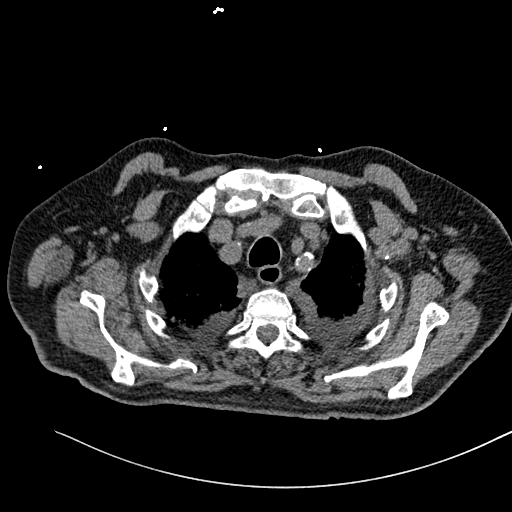
[im 118/143  lung]
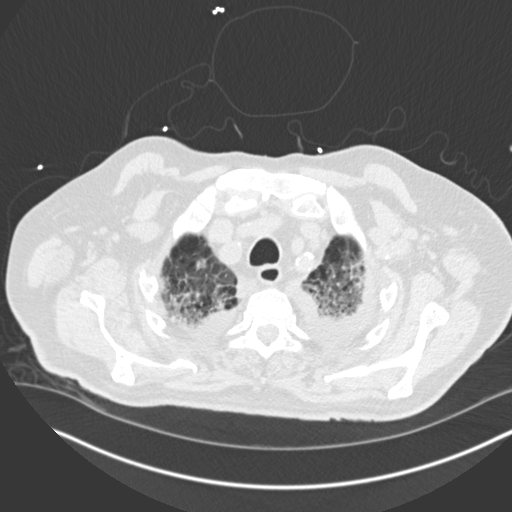
[im 130/143  lung]
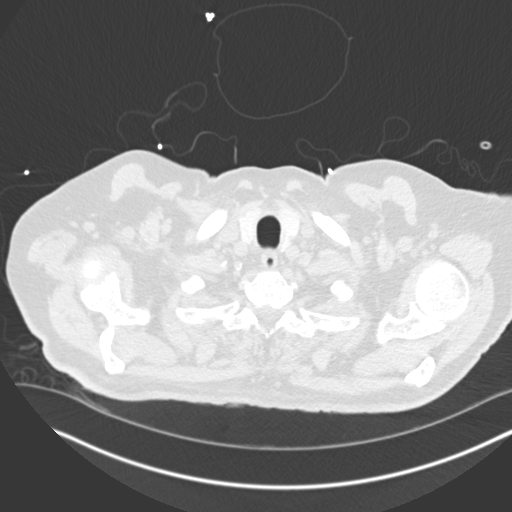

[13 of 36 positions shown; findings below may reference images not displayed]

FINDINGS: Cardiovascular: Mild cardiomegaly. No significant pericardial
effusion/thickening. Three-vessel coronary atherosclerosis status
post CABG. Atherosclerotic nonaneurysmal thoracic aorta. Dilated
main pulmonary artery (3.5 cm diameter), increased from 3.1 cm on
[DATE] chest CT.

Mediastinum/Nodes: No discrete thyroid nodules. Unremarkable
esophagus. No axillary adenopathy. Stable mildly enlarged 1.2 cm
subcarinal node (series 13/image 75). No new pathologically enlarged
mediastinal nodes. No discrete hilar adenopathy on this noncontrast
scan.

Lungs/Pleura: No pneumothorax. No pleural effusion. Small dependent
bilateral pleural effusions. Moderate to severe centrilobular and
paraseptal emphysema with diffuse bronchial wall thickening. There
is extensive patchy consolidation, reticulation and ground-glass
opacity throughout both lungs involving all lung lobes, most
prominent in the upper lobes, with a strong dependent distribution,
new since [DATE] chest CT. There is associated volume loss and
mild architectural distortion, without significant regions of
traction bronchiectasis or frank honeycombing. No significant
lobular air trapping on expiration sequence. No evidence of
tracheobronchomalacia. No lung masses or significant pulmonary
nodules.

Upper abdomen: Simple 1.8 cm superior right liver cyst. Suggestion
of diffuse gastric wall thickening in the nondistended stomach, not
appreciably changed.

Musculoskeletal: No aggressive appearing focal osseous lesions. Mild
to moderate gynecomastia, asymmetric to the left, unchanged. Mild
thoracic spondylosis. Intact sternotomy wires.
IMPRESSION: 1. Cardiomegaly. Small dependent bilateral pleural effusions.
Dilated main pulmonary artery, suggesting pulmonary arterial
hypertension, increased.
2. Extensive patchy consolidation, reticulation and ground-glass
opacity throughout both lungs, most prominent in the upper lobes,
with a strong dependent distribution, new since [DATE] chest CT.
Associated volume loss and mild architectural distortion. No
significant bronchiectasis or frank honeycombing. Findings suggest a
combination pneumonia (including possible aspiration pneumonia given
dependent distribution), atelectasis and possibly early
postinflammatory fibrosis. Suggest high-resolution chest CT
follow-up in 3-6 months.
3. Moderate to severe centrilobular and paraseptal emphysema with
diffuse bronchial wall thickening, suggesting underlying COPD.
4. Stable mild mediastinal lymphadenopathy, nonspecific, most likely
reactive.
5. Suggestion of diffuse gastric wall thickening in the nondistended
stomach, not appreciably changed since [DATE] CT and
nonspecific. Upper endoscopic correlation may be considered.

Aortic Atherosclerosis ([3R]-[3R]) and Emphysema ([3R]-[3R]).

## 2019-05-18 MED ORDER — MAGNESIUM SULFATE 4 GM/100ML IV SOLN
4.0000 g | Freq: Once | INTRAVENOUS | Status: AC
  Administered 2019-05-18: 4 g via INTRAVENOUS
  Filled 2019-05-18: qty 100

## 2019-05-18 NOTE — Progress Notes (Signed)
Physical Therapy Treatment Patient Details Name: Dylan Goodman MRN: [id number] DOB: [birth year] Today's Date: 05/18/2019    History of Present Illness Pt is a 68 yo male s/p recent hospitalization 9/13 for COVID+ PNA. THis admission due to dizziness, witnessed 2-minute tonic/clonic type seizure. COVID Test again positive. 10/7 when he abruptly developed respiratory distress and tachycardia requiring CPR. 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    PT Comments    Pt agreeable to therapy but does not want to get into recliner. Agrees to stand while NT makes his bed. Pt with no reports of dizziness today. Pt requires min A for bed mobility and able to sit EoB for 5 minutes while having back bathed. Pt then able to stand with minA for power up and steadying and stand for 40 seconds while bed linens were changed. Brief period of O2 desaturation to 86%O2 with standing but quickly rebounded in supine. D/c plans remain appropriate at this time. PT will continue to follow acutely.   Follow Up Recommendations  CIR     Equipment Recommendations  None recommended by PT    Recommendations for Other Services Rehab consult     Precautions / Restrictions Precautions Precautions: Fall Precaution Comments: watch O2; dizziness with OOB Restrictions Weight Bearing Restrictions: No    Mobility  Bed Mobility Overal bed mobility: Needs Assistance Bed Mobility: Sit to Supine     Supine to sit: Min assist Sit to supine: Min guard   General bed mobility comments: min A for trunk to upright. pt able to get LE back in bed on sit>supine  Transfers Overall transfer level: Needs assistance Equipment used: Rolling walker (2 wheeled) Transfers: Sit to/from Stand Sit to Stand: Min assist         General transfer comment: minA for power up into standing, pt able to stand for 40 sec before  sitting back down        Balance Overall balance assessment: Needs assistance   Sitting balance-Leahy Scale: Fair Sitting balance - Comments: able to sit EoB while bed stripped    Standing balance support: Bilateral upper extremity supported Standing balance-Leahy Scale: Poor Standing balance comment: requires UE support to maintain standing for 40 sec                             Cognition Arousal/Alertness: Awake/alert Behavior During Therapy: WFL for tasks assessed/performed Overall Cognitive Status: Within Functional Limits for tasks assessed                                           General Comments General comments (skin integrity, edema, etc.): Pt on 35L HFNC with SaO2 >90% except at end of standing when it dropped to 86%O2, quickly rebounded to 93%O2 once in supine      Pertinent Vitals/Pain Pain Assessment: No/denies pain Faces Pain Scale: No hurt           PT Goals (current goals can now be found in the care plan section) Acute Rehab PT Goals PT Goal Formulation: With patient Time For Goal Achievement: 05/22/19 Potential to Achieve Goals: Good Progress towards PT goals: Progressing toward goals    Frequency    Min 3X/week      PT Plan Current  plan remains appropriate       AM-PAC PT "6 Clicks" Mobility   Outcome Measure  Help needed turning from your back to your side while in a flat bed without using bedrails?: None Help needed moving from lying on your back to sitting on the side of a flat bed without using bedrails?: A Little Help needed moving to and from a bed to a chair (including a wheelchair)?: Total Help needed standing up from a chair using your arms (e.g., wheelchair or bedside chair)?: Total Help needed to walk in hospital room?: Total Help needed climbing 3-5 steps with a railing? : Total 6 Click Score: 11    End of Session Equipment Utilized During Treatment: Oxygen(HFNC 35L ) Activity Tolerance:  Patient limited by fatigue Patient left: in bed;with call bell/phone within reach;with bed alarm set;with nursing/sitter in room Nurse Communication: Mobility status PT Visit Diagnosis: Unsteadiness on feet (R26.81);Other abnormalities of gait and mobility (R26.89);Muscle weakness (generalized) (M62.81);Difficulty in walking, not elsewhere classified (R26.2)     Time: 2010-0712 PT Time Calculation (min) (ACUTE ONLY): 28 min  Charges:  $Therapeutic Activity: 8-22 mins $Self Care/Home Management: 8-22                     Darlean Warmoth B. Migdalia Dk PT, DPT Acute Rehabilitation Services Pager [phone] Office [phone]    [place] 05/18/2019, 6:31 PM

## 2019-05-18 NOTE — Progress Notes (Signed)
RT arrived to patient room. Pt took the [place] cannula out of his nose. Patient O2 sat 71%. RT put HHFNC back in patients nose and also place the NRB on patient to help his oxygen recover. Patient in no distress at this time.

## 2019-05-18 NOTE — Progress Notes (Addendum)
PROGRESS NOTE    Jerrie Gullo  SWF:[id number] DOB: 1950/08/21 DOA: 05/06/2019 PCP: Steele Sizer, MD    Brief Narrative:  Patient is a 68 year old male with history of atrial fibrillation, GI bleed, coronary artery disease status post CABG, nasopharyngeal cancer, cardiomyopathy, chronic depression, CKD stage 4, diabetes mellitus, GI bleed , peripheral vascular disease, previous smoker, recent Covid-19 infection about 3 weeks ago before admission who presented to [facility] with seizure.  He sustained a cardiac arrest there in the emergency department after an seizure episode.  Due to Covid positivity he was sent to [facility] and was admitted under PCCM service.  He was defibrillated after cardiac arrest.  Intubated for airway protection.  Currently extubated and maintaining his saturation on nasal cannula.  Patient transferred to [facility] service on 05/08/19.  Hospital course remarkable for development of AKI on CKD,Afib with RVR.Cardiology,nephrology following. His overall status is improving.  He Might need 1 or 2 more days before discharge.    Assessment & Plan:   Active Problems:   Acute kidney injury superimposed on chronic kidney disease (HCC)   Atrial fibrillation with RVR (HCC)   Acute respiratory failure with hypoxemia (HCC)   Cardiac arrest (HCC)   Prolonged QT interval   Seizure (HCC)   1 status post cardiac arrest It is noted that patient's initial rhythm was reported as V. tach with conversion to A. fib with RVR and subsequently defibrillated.  Patient noted to be down for estimated 11 minutes.  Patient currently hemodynamically stable.  2D echo done 05/08/2019 with a EF of 40 to 45%.  Patient being followed by cardiology.  Continue current cardiac regimen of metoprolol.  Diltiazem added to regimen for better rate control.  Per cardiology.  2.  New onset A. fib with RVR Patient was noted to have been taken off the diltiazem drip on 05/12/2019, and placed on metoprolol.   Patient with heart rate sustained in the 120s to 130s per RN the morning of 05/13/2019.  Oral Cardizem added to patient's current regimen of metoprolol for better rate control.  Eliquis for anticoagulation.  Keep magnesium greater than 2.  Keep potassium greater than 4.  Appreciate cardiology input and recommendations.   3.  Acute kidney injury on chronic kidney disease stage IV Baseline creatinine noted to be approximately 2.5-3.  Acute kidney injury felt secondary to ATN due to hypotension secondary to cardiac arrest.  Patient also noted to have a metabolic acidosis.  Patient was on a bicarb drip which is subsequently been discontinued.  Patient with a urine output of 1.050 L over the past 24 hours.  Creatinine fluctuating currently at 3.09 from 3.44 from 3.65 from 3.84 from 3.69 from 5.17 on 05/09/2019.  Nephrology following and per nephrology no indication for dialysis at this time.  4.  Metabolic acidosis Likely secondary to problem #3.  Improved with bicarb drip.  Per nephrology.  5.  Acute hypoxic respiratory failure/COPD exacerbation Patient was admitted and intubated on admission for airway protection.  Patient noted not to be on O2 at home.  Chest x-ray 05/07/2019 with stable bibasilar opacities.  Patient with worsening hypoxia and worsening shortness of breath with use of accessory muscles of respiration and was on 10 L nonrebreather the morning of 05/14/2019.  Patient with some improvement with air movement however still with significant hypoxia. Chest x-ray with persistent patchy bilateral pulmonary infiltrates worsening of left upper lung infiltrate.  Underlying chronic interstitial lung disease most likely.  Bilateral pleural effusions unchanged.  ABG done with a pH of 7.346, PCO2 of 41, PO2 of 55.  Patient on anticoagulation with Eliquis and as such doubt it if PE.  Patient was COVID-19 + on 05/04/2019.  Ferritin elevated but trending down.  Procalcitonin trending down.  CRP trending down.   PCCM has reassessed patient and patient started on IV steroids due to concern for probable COPD exacerbation.  Patient given Lasix 40 mg IV intermittently over the past few days.  1.050 L over the past 24 hours.  Will give another dose of Lasix 40 mg IV x1. HRCT of chest ordered per PCCM.  PCCM following and appreciate input and recommendations.   6.  Recent COVID-19 infection Patient diagnosed with COVID-19 approximately 3 weeks ago.  COVID-19 test on 05/04/2019 was positive.  Patient has not required any COVID specific treatment at this time.  Patient now with some acute respiratory distress with increased O2 requirements now on 10 L.  Chest x-ray done the morning of 05/14/2019, with persistent patchy bilateral pulmonary infiltrates worsening of the left upper lung infiltrate.  Underlying chronic interstitial lung disease most likely present.  Bilateral pleural effusions unchanged.  ABG done with a pH of 7.346, PCO2 of 41, PO2 of 55.  Patient has been reassessed by critical care medicine and patient started on IV Solu-Medrol per PCCM for COPD exacerbation versus Covid pneumonitis or developing the fibrosis from Covid..  Continue current airborne precautions.  Continue supportive care.   7.  Hypotension Patient had required a Levophed drip due to hypotension.  Blood pressure improved off the drip.  Patient currently on oral metoprolol and Cardizem.  Follow.   8.  Transaminitis Felt secondary to shock liver.  Improved.  Follow.  9.  Seizure Patient had presented with seizures.  Patient noted to have a seizure episode in the emergency room and started on Keppra.  Continue Keppra.  Neurology was following.  Will need outpatient follow-up with neurology.  10.  Deconditioning/debility Evaluated by PT/ OT.  Inpatient rehab assessing.  11.  Hypokalemia/hypomagnesemia Magnesium at 1.7.  Potassium at 5.  Magnesium sulfate 4 g IV x1.  Continue oral magnesium.  Follow.   DVT prophylaxis: Eliquis Code  Status: Full Family Communication: Updated patient.  No family at bedside. Disposition Plan: Home once A. fib is well controlled, clinical improvement, improvement with hypoxia and when cleared by nephrology and cardiology and critical care medicine.   Consultants:   PCCM  Cardiology  Procedures:   Intubation/extubation  Chest x-ray 05/14/2019  High-resolution CT chest pending 05/18/2019  Antimicrobials:   IV Zosyn 05/06/2019>>>> 05/10/2019  IV vancomycin 05/07/2019 201 dose     Subjective: Patient sleeping somewhat lethargic however opens eyes to verbal stimuli.  Denies any significant improvement with shortness of breath over the past 24 to 48 hours.  Denies any chest pain.  On high flow nasal cannula with sats of 100% FiO2 of 35.   Objective: Vitals:   05/18/19 0700 05/18/19 0815 05/18/19 0825 05/18/19 0828  BP: (!) 118/102 (!) 139/95    Pulse:      Resp: 18     Temp: 97.8 F (36.6 C)     TempSrc: Oral     SpO2: (!) 89% 90% 91% 94%  Weight:      Height:        Intake/Output Summary (Last 24 hours) at 05/18/2019 1049 Last data filed at 05/18/2019 0816 Gross per 24 hour  Intake --  Output 1450 ml  Net -1450 ml  Filed Weights   05/15/19 0413 05/15/19 2100 05/16/19 0712  Weight: 96 kg 96 kg 95.3 kg    Examination:  General exam: On nonrebreather at 10 L. Respiratory system: Fair air movement.  Less tight.  Clear to auscultation anterior lung fields.  No significant wheezing noted.  Cardiovascular system: Irregularly irregular.  No JVD.  No rubs gallops or murmurs.  No lower extremity edema.   Gastrointestinal system: Abdomen is soft, nontender, nondistended, positive bowel sounds.  No rebound.  No guarding. Central nervous system: Alert and oriented. No focal neurological deficits. Extremities: Symmetric 5 x 5 power. Skin: No rashes, lesions or ulcers Psychiatry: Judgement and insight appear normal. Mood & affect appropriate.     Data Reviewed: I  have personally reviewed following labs and imaging studies  CBC: Recent Labs  Lab 05/12/19 0432 05/13/19 0558 05/14/19 0517 05/15/19 0539 05/16/19 0723  WBC 10.7* 13.9* 14.4* 8.9 9.8  NEUTROABS  --  9.9* 11.6* 7.8* 8.9*  HGB 11.1* 12.3* 12.4* 12.1* 12.0*  HCT 34.4* 40.0 39.3 38.7* 39.5  MCV 93.5 96.4 95.6 97.2 98.0  PLT 248 270 258 225 832   Basic Metabolic Panel: Recent Labs  Lab 05/12/19 0432 05/13/19 0558 05/14/19 0517 05/15/19 0539 05/16/19 0723 05/18/19 0641  NA 140 140 140 138 139 142  K 3.7 4.9 4.9 4.6 4.3 5.0  CL 103 105 105 104 101 103  CO2 24 20* 20* 23 23 24   GLUCOSE 126* 140* 161* 191* 202* 228*  BUN 50* 53* 64* 76* 86* 97*  CREATININE 3.99* 3.69* 3.84* 3.65* 3.44* 3.09*  CALCIUM 8.6* 8.9 9.1 8.4* 8.6* 9.1  MG 1.2* 1.3* 1.9  --  1.8 1.7  PHOS  --   --  5.8*  --   --   --    GFR: Estimated Creatinine Clearance: 28.1 mL/min (A) (by C-G formula based on SCr of 3.09 mg/dL (H)). Liver Function Tests: Recent Labs  Lab 05/14/19 0517  ALBUMIN 2.5*   No results for input(s): LIPASE, AMYLASE in the last 168 hours. No results for input(s): AMMONIA in the last 168 hours. Coagulation Profile: No results for input(s): INR, PROTIME in the last 168 hours. Cardiac Enzymes: No results for input(s): CKTOTAL, CKMB, CKMBINDEX, TROPONINI in the last 168 hours. BNP (last 3 results) No results for input(s): PROBNP in the last 8760 hours. HbA1C: No results for input(s): HGBA1C in the last 72 hours. CBG: Recent Labs  Lab 05/17/19 1634 05/17/19 2045 05/18/19 0042 05/18/19 0424 05/18/19 0811  GLUCAP 216* 203* 185* 196* 200*   Lipid Profile: No results for input(s): CHOL, HDL, LDLCALC, TRIG, CHOLHDL, LDLDIRECT in the last 72 hours. Thyroid Function Tests: No results for input(s): TSH, T4TOTAL, FREET4, T3FREE, THYROIDAB in the last 72 hours. Anemia Panel: No results for input(s): VITAMINB12, FOLATE, FERRITIN, TIBC, IRON, RETICCTPCT in the last 72 hours. Sepsis  Labs: Recent Labs  Lab 05/14/19 0911  PROCALCITON 1.78    No results found for this or any previous visit (from the past 240 hour(s)).       Radiology Studies: No results found.      Scheduled Meds:  apixaban  5 mg Oral BID   budesonide  1 puff Inhalation BID   calcitRIOL  0.25 mcg Oral Daily   Chlorhexidine Gluconate Cloth  6 each Topical Daily   diltiazem  300 mg Oral Daily   feeding supplement (GLUCERNA SHAKE)  237 mL Oral TID BM   insulin aspart  0-9 Units Subcutaneous TID WC  Ipratropium-Albuterol  1 puff Inhalation Q6H   levETIRAcetam  500 mg Oral BID   magnesium oxide  800 mg Oral Daily   methylPREDNISolone (SOLU-MEDROL) injection  40 mg Intravenous Q12H   metoprolol tartrate  100 mg Oral BID   pantoprazole  40 mg Oral Daily   sodium chloride flush  3 mL Intravenous Q12H   Continuous Infusions:  sodium chloride     magnesium sulfate bolus IVPB 4 g (05/18/19 0916)     LOS: 12 days    Time spent: 40 minutes    Irine Seal, MD [facility]  If 7PM-7AM, please contact night-coverage [email or web address] 05/18/2019, 10:49 AM

## 2019-05-18 NOTE — Progress Notes (Signed)
PCCM progress note  Patient chart reviewed.  Remains on high flow nasal cannula without improvement over the weekend We will order HRCT to further evaluate the lung.  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 We will follow back after her chest CT is completed Discussed with Dr. Grandville Silos.   Marshell Garfinkel MD [facility] 05/18/2019, 10:26 AM

## 2019-05-19 ENCOUNTER — Encounter: Payer: Federal, State, Local not specified - PPO | Admitting: Physical Therapy

## 2019-05-19 DIAGNOSIS — J9601 Acute respiratory failure with hypoxia: Secondary | ICD-10-CM | POA: Diagnosis not present

## 2019-05-19 DIAGNOSIS — J069 Acute upper respiratory infection, unspecified: Secondary | ICD-10-CM | POA: Clinically undetermined

## 2019-05-19 DIAGNOSIS — U071 COVID-19: Secondary | ICD-10-CM | POA: Clinically undetermined

## 2019-05-19 LAB — BLOOD GAS, ARTERIAL
Acid-Base Excess: 1.3 mmol/L (ref 0.0–2.0)
Bicarbonate: 25.9 mmol/L (ref 20.0–28.0)
Drawn by: 511471
FIO2: 100
O2 Content: 40 L/min
O2 Saturation: 84.9 %
Patient temperature: 98
pCO2 arterial: 43.8 mmHg (ref 32.0–48.0)
pH, Arterial: 7.387 (ref 7.350–7.450)
pO2, Arterial: 55.7 mmHg — ABNORMAL LOW (ref 83.0–108.0)

## 2019-05-19 LAB — BASIC METABOLIC PANEL
Anion gap: 14 (ref 5–15)
BUN: 96 mg/dL — ABNORMAL HIGH (ref 8–23)
CO2: 23 mmol/L (ref 22–32)
Calcium: 8.8 mg/dL — ABNORMAL LOW (ref 8.9–10.3)
Chloride: 103 mmol/L (ref 98–111)
Creatinine, Ser: 2.75 mg/dL — ABNORMAL HIGH (ref 0.61–1.24)
GFR calc Af Amer: 27 mL/min — ABNORMAL LOW (ref >60)
GFR calc non Af Amer: 23 mL/min — ABNORMAL LOW (ref >60)
Glucose, Bld: 235 mg/dL — ABNORMAL HIGH (ref 70–99)
Potassium: 5.3 mmol/L — ABNORMAL HIGH (ref 3.5–5.1)
Sodium: 140 mmol/L (ref 135–145)

## 2019-05-19 LAB — GLUCOSE, CAPILLARY
Glucose-Capillary: 201 mg/dL — ABNORMAL HIGH (ref 70–99)
Glucose-Capillary: 211 mg/dL — ABNORMAL HIGH (ref 70–99)
Glucose-Capillary: 218 mg/dL — ABNORMAL HIGH (ref 70–99)
Glucose-Capillary: 220 mg/dL — ABNORMAL HIGH (ref 70–99)
Glucose-Capillary: 228 mg/dL — ABNORMAL HIGH (ref 70–99)
Glucose-Capillary: 242 mg/dL — ABNORMAL HIGH (ref 70–99)

## 2019-05-19 LAB — PROCALCITONIN: Procalcitonin: 0.32 ng/mL

## 2019-05-19 LAB — MAGNESIUM: Magnesium: 2.5 mg/dL — ABNORMAL HIGH (ref 1.7–2.4)

## 2019-05-19 MED ORDER — SODIUM ZIRCONIUM CYCLOSILICATE 10 G PO PACK
10.0000 g | PACK | Freq: Once | ORAL | Status: AC
  Administered 2019-05-19: 10 g via ORAL
  Filled 2019-05-19: qty 1

## 2019-05-19 MED ORDER — SODIUM CHLORIDE 0.9 % IV SOLN
2.0000 g | Freq: Two times a day (BID) | INTRAVENOUS | Status: DC
  Administered 2019-05-19 (×2): 2 g via INTRAVENOUS
  Filled 2019-05-19 (×3): qty 2

## 2019-05-19 MED ORDER — SODIUM CHLORIDE 0.9 % IV SOLN
100.0000 mg | INTRAVENOUS | Status: AC
  Administered 2019-05-20 – 2019-05-23 (×4): 100 mg via INTRAVENOUS
  Filled 2019-05-19 (×4): qty 20

## 2019-05-19 MED ORDER — FUROSEMIDE 10 MG/ML IJ SOLN
60.0000 mg | Freq: Once | INTRAMUSCULAR | Status: AC
  Administered 2019-05-19: 60 mg via INTRAVENOUS
  Filled 2019-05-19: qty 6

## 2019-05-19 MED ORDER — SODIUM CHLORIDE 0.9 % IV SOLN
200.0000 mg | Freq: Once | INTRAVENOUS | Status: AC
  Administered 2019-05-19: 200 mg via INTRAVENOUS
  Filled 2019-05-19: qty 40

## 2019-05-19 NOTE — Progress Notes (Signed)
On call hospitalist, Baltazar Najjar, contacted for RT. Possible Blood gas draw

## 2019-05-19 NOTE — Progress Notes (Signed)
eLink Physician-Brief Progress Note Patient Name: Dylan Goodman DOB: 12-Sep-1950 MRN: [id number]   Date of Service  05/19/2019  HPI/Events of Note  Pt with recent COVID-19 infection, now on 2W with pneumonia and acute respiratory failure, on 40 L O2 via non-rebreather, clinically he appears comfortable despite the high O2 requirements , ABG PH 7.38 Pco2 43 PO2 55. He is pending transfer to [facility].  eICU Interventions  Patient will be watched closely by Baltazar Najjar NP while awaiting transfer to [facility], no indication for emergent intubation right now, but low threshold for intubation if patient deteriorates.        [name] Ogan 05/19/2019, 9:29 PM

## 2019-05-19 NOTE — Progress Notes (Signed)
NAME:  Dylan Goodman, MRN:  [id number], DOB:  09/29/1950, LOS: 70 ADMISSION DATE:  May 14, 2019, CONSULTATION DATE:  14-May-2023 REFERRING MD:  Dr Jimmye Norman [facility] EDP, CHIEF COMPLAINT:  Cardiac arrest   Brief History   68 year old male recently admitted with COVID PNA 9/13 now presenting with seizure and subsequently suffered brief cardiac arrest in the [facility] ED. Intubated and transferred to [facility].   Extubated.  Reconsulted 10/15 for worsening respiratory status.  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  Significant Hospital Events   9/14-9/19 admit to [facility] for COVID pneumonia.  10/05 to ED for Chest pain. Discharged home after negative cardiac. 10/06 To ED for seizure 05/14/23 Coded in ED while awaiting placement.  10/8 Extubated and transferred from ICU 10/15 PCCM called back for hypoxia 10/17-10/20 Remains on 100%, 30L flow  Consults:  Neurology Cardiology Nephrology PCCM  Procedures:  ETT 2023/05/14 >> 10/8  Significant Diagnostic Tests:  Renal US 9/14 >> WNL Lower extremity doppler 2023-05-14 >> CT head 10/6 >> neg CXR 10/15 >> ILD, possible some volume overload Echo 10/0 >> LVEF 45-50%, global normal RV systolic function, LA severely dilated, RA moderately dilated, mild MVR, mild TR, moderately elevated pulmonary artery systolic pressure    High-resolution CT 05/18/2019-cardiomegaly, small pleural effusion.  Extensive patchy consolidation, with  reticulation, groundglass opacities.  Most prominent in the upper lobes with dependent distribution.  Moderate to severe centrilobular and paraseptal emphysema. I have reviewed the images personally.  Micro Data:  COVID 10/6 > POSITIVE Blood May 14, 2023 > neg Urine May 14, 2023 > neg Trach aspirate 05/14/2023 > normal flora   Antimicrobials:  Zosyn 05/14/2023 > 10/11  Interim history/subjective:  Remains on 100% FiO2 and 30 L nasal cannula with no improvement over the weekend. No new complaints today  Objective   Blood pressure (!) 144/98, pulse 97, temperature 98 F (36.7 C), temperature source Oral, resp. rate 17, height 5\' 11"  (1.803 m), weight 95.3 kg, SpO2 92 %.       FiO2 (%):  [100 %] 100 %   Intake/Output Summary (Last 24 hours) at 05/19/2019 1148 Last data filed at 05/19/2019 0700 Gross per 24 hour  Intake -  Output 1450 ml  Net -1450 ml   Filed Weights   05/15/19 0413 05/15/19 2100 05/16/19 0712  Weight: 96 kg 96 kg 95.3 kg   Examination:  Gen:      No acute distress HEENT:  EOMI, sclera anicteric Neck:     No masses; no thyromegaly Lungs:    Clear to auscultation bilaterally; normal respiratory effort CV:         Regular rate and rhythm; no murmurs Abd:      + bowel sounds; soft, non-tender; no palpable masses, no distension Ext:    No edema; adequate peripheral perfusion Skin:      Warm and dry; no rash Neuro: alert and oriented x 3 Psych: normal mood and affect  Resolved Hospital Problem list   N/A  Assessment & Plan:  68 year old male with diagnosis of COVID 19 over a month ago presenting initially with cardiac arrest  thought due to arrhythmia.  Weaned from vent but with persistent hypoxic resp failure and pulmonary infiltrates  Acute hypoxemic respiratory failure  High res CT reviewed with pneumonitis, early fibrosis Ddx is  persistent Covid 19 infection, post infectious pneumonitis, acute amiodarone toxicity (pt got amio on admission) Since he is not making much progress  it would be reasonable to re try remdesivir and antibiotics.  Continue steroids Discussed with Dr. [place] Bells and Dr. Grandville Silos.  We will transfer him to [facility] for further management.  Incentive spirometer, awake proning. Nebs Diuresis as tolerated  Possible seizure now on keppra  Afib  Per Cardiology  Continue eliquis, cardizem, lopressor  Avoid further amiodarone  DM2 Per primary   CHF  Per primary   Deconditioning  From recurrent admissions, critical illness . Physical therapy  AKI Monitor urine output and creatinine  Marshell Garfinkel MD [facility] 05/19/2019, 11:48 AM

## 2019-05-19 NOTE — Progress Notes (Addendum)
NP was paged by RN earlier because when she went in pt's room for shift assessment, pt was only satting in the 70s on 35L heated HFNC. She placed the pt on a NRB at 100% as well and O2 sats came up to 88%. RT was in the room and NP spoke to her. She stated pt was not working hard to breath, just a little tachypneic, but lungs are very congested. ABG was ordered and NP to bedside.  S: Pt states he doesn't really feel SOB. He has no pain. Has a "little bit" of a cough.  O: Poor appearing, although, non toxic appearing white male. He is in no distress and is alert. Speech is appropriate. BP 130s, HR 90s, RR 27, SaO2 on NRB with 40L heated HFNC. Card: S1S2. Lungs: congested with wheezing noted in upper lobes. Diminished lower lobes. He is tachypneic, but not working hard to breathe. No use of accessory muscles. Abd is soft, NT.  A/P: This is a pretty sick guy with COVID PNA and pneumonitis. Ct chest also shows changes of COPD/emphysema. He is being treated by [facility] and PCCM.  1. Resp failure secondary to COVID PNA and pneumonitis-He was restarted on steroids and Remdesivir by PCCM today. ABG is normal except a PO2 of 55. This NP spoke to critical care. He agrees with plan for now and checking on pt after Lasix and Solumedrol is given. Pt is due to be transferred to [facility] at some point to ICU. Pt sounds wet and only 200cc of UO is charted today. No intake is charted. It is time for his Solumedrol, and we will given 60mg  Lasix x 1. Strict intake and output.   As NP went out to the nurse's station, NP learned that [facility] had a bed and pt would be transferred soon.  Upon recheck-Pt has had 375cc urine output since Lasix. He appears more comfortable. O2 sats are 88%, but this is OK due to his hx of COPD and emphysema.  KJKG, NP [facility] Update: Ambulance came to get Mr. Muckey and they are not equipped to transfer someone on heated HF O2. When we tried just a NRB, pt desatted to 70%. NP called PCCM again. They agree to  accept pt to ICU here at [facility]. TF order placed and RN knows the plan.  [facility], NP [facility] Performed by: Gardiner Barefoot   Total critical care time: Start 1940 hrs. End 2030 total 50 mins.  Critical care time was exclusive of separately billable procedures and treating other patients.  Critical care was necessary to treat or prevent imminent or life-threatening deterioration.  Critical care was time spent personally by me on the following activities: development of treatment plan with patient and/or surrogate as well as nursing, discussions with consultants, evaluation of patient's response to treatment, examination of patient, obtaining history from patient or surrogate, ordering and performing treatments and interventions, ordering and review of laboratory studies, ordering and review of radiographic studies, pulse oximetry and re-evaluation of patient's condition.

## 2019-05-19 NOTE — Progress Notes (Signed)
NAME:  Dylan Goodman, MRN:  [id number], DOB:  1950-08-21, LOS: 6 ADMISSION DATE:  May 08, 2019, CONSULTATION DATE:  2023-05-08 REFERRING MD:  Dr Jimmye Norman [facility] EDP, CHIEF COMPLAINT:  Cardiac arrest   Brief History   68 year old male recently admitted with COVID PNA 9/13 now presenting with seizure and subsequently suffered brief cardiac arrest in the [facility] ED. Intubated and transferred to [facility].   Extubated.  Reconsulted 10/15 for worsening respiratory status.  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  Significant Hospital Events   9/14-9/19 admit to [facility] for COVID pneumonia.  10/05 to ED for Chest pain. Discharged home after negative cardiac. 10/06 To ED for seizure May 08, 2023 Coded in ED while awaiting placement 10/15 PCCM called back  10/17 Remains on 100%, 30L flow 10/20 placed back on remdesevir, when attempting to transfer to [facility] desaturated. Moved to ICU 10/21 renal fxn worse  Consults:  Neurology Cardiology Nephrology  Procedures:  ETT 2023/05/08 >> 10/8  Significant Diagnostic Tests:  Renal US 9/14 >> WNL Lower extremity doppler 2023-05-08 >> CT head 10/6 >> neg CXR 10/15 >> ILD, possible some volume overload Echo 10/9 >> LVEF 45-50%, global normal RV systolic function, LA severely dilated, RA moderately dilated, mild MVR, mild TR, moderately elevated pulmonary artery systolic pressure   CT chest 10/19:1. Cardiomegaly. Small dependent bilateral pleural  effusions.Dilated main pulmonary artery, suggesting pulmonary arterial hypertension, increased.2. Extensive patchy consolidation, reticulation and ground-glass opacity throughout both lungs, most prominent in the upper lobes, with a strong dependent distribution, new since 03/24/2019 chest CT.Associated volume loss and mild architectural distortion. No significant bronchiectasis or frank honeycombing. Findings suggest a combination pneumonia (including possible aspiration pneumonia given dependent distribution), atelectasis and possibly early postinflammatory fibrosis. Suggest high-resolution chest CT follow-up in 3-6 months. 3. Moderate to severe centrilobular and paraseptal emphysema with diffuse bronchial wall thickening, suggesting underlying COPD. 4. Stable mild mediastinal lymphadenopathy, nonspecific, most likely reactive. 5. Suggestion of diffuse gastric wall thickening in the nondistended stomach, not appreciably changed since 03/24/2019 CT and nonspecific. Upper endoscopic correlation may be considered. Ongoing hypoxic respiratory failure in setting of diffuse pulmonary infiltrates. CT of chest obtained on 10/19 demonstrated: Cardiomegaly, small 1.2 cm subcarinal node,Small right greater than left pleural effusions.  Paraseptal emphysema with bronchial wall thickening.  Extensive patchy consolidative airspace disease with groundglass changes throughout both lungs.   Micro Data:  COVID 10/6 > POSITIVE Blood May 08, 2023 > neg Urine May 08, 2023 > neg Trach aspirate 05-08-23 > normal flora   Antimicrobials:  Zosyn 2023-05-08 > 10/11 Cefepime 10/20 Remdesivir 10/20  Interim history/subjective:   Objective   Blood pressure (Abnormal) 144/98, pulse 97, temperature 98 F (36.7 C), temperature source Oral, resp. rate 17, height 5\' 11"  (1.803 m), weight 95.3 kg, SpO2 92 %.       FiO2 (%):  [100 %] 100 %   Intake/Output Summary (Last 24 hours) at 05/19/2019 1009 Last data filed at 05/19/2019 0700 Gross per 24  hour  Intake no documentation  Output 1750 ml  Net -1750 ml   Filed Weights   05/15/19 0413 05/15/19 2100 05/16/19 7096  Weight: 96 kg 96 kg 95.3 kg    Examination:   General this is a 68 year old white male who is currently on both high flow oxygen in 100% nonrebreather he is exhibiting accessory use and significant work of breathing unable to speak more than 2 word phrases HEENT normocephalic atraumatic mucous membranes are dry I see no neck vein distention Pulmonary marked accessory use as mentioned above diffuse rales and rhonchi paradoxical efforts are present Cardiac atrial fibrillation on telemetry currently with controlled ventricular rate there is no murmur rub or gallop appreciated Abdomen: Soft nontender some diaphragmatic efforts noted hypoactive GU clear yellow Neuro awake moves extremities follows commands intermittently confused however this is improved over the course of the morning Extremities cool, some peripheral edema pulses are palpable.  Resolved Hospital Problem list   N/A  Assessment & Plan:  68 year old male with diagnosis of COVID 19 over a month ago presenting initially with cardiac arrest thought due to arrhythmia.  Now having worsening respiratory distress   Assessment/plan:  Acute hypoxic respiratory failure, post COVID-19 pneumonia in September Current working diagnosis is post Covid pneumonitis versus evolving pulmonary fibrosis, cannot exclude element of volume overload, however he is 5.4 L negative making this seem less likely -His inflammatory markers have been trending down  -he has ongoing refractory hypoxia -He was started on remdesivir 10/20 -Moved urgently to the intensive care last night after desaturation episode when attempting to transition to high flow oxygen Plan Continue supplemental oxygen; will trial Prone position w/ NRB He has a bed at [facility], unfortunately he cannot transport on high flow oxygen. 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 Currently with controlled ventricular rate Plan Continuing anticoagulation Continue calcium channel blocker Continue current Lopressor dosing  Severe deconditioning Plan Will need extensive physical therapy  Diabetes type 2, exacerbated by steroids Plan Sliding scale insulin increased to resistant scale Added basal dosing w/ meals  Possible seizure disorder Plan Continuing Keppra  Best practice CODE STATUS: Full code Diet: Heart healthy Glycemic control: Sliding scale insulin Activity: Bedrest H2B: pepcid   Disposition is a 68 year old white male with progressive and refractory hypoxia and 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  He is now on salvage therapy with his second round of remdesivir.  He did not tolerate attempted transferred on nonrebreather alone.  I would like to avoid intubation at all costs as I do not think he will be extubated should we intubate him.  Because of this we are going to attempt prone positioning on nonrebreather mask prior to transport.  If he can tolerate this will transport him to [facility] for further supportive care and time I am not sure there is much more we can offer for this man other than that.  For now we will continue supportive therapy this will include supplemental oxygen, systemic steroids, he is now on day 2 of remdesivir, and attempt proning position.  In addition of asked nephrology to reevaluate him as his potassium is 6 and his BUN and creatinine are climbing yet again.  I suspect this is secondary to  overdiuresis however his hyperkalemia may force our hand for dialysis  My critical care time 63 minutes  Erick Colace ACNP-BC [place] Pager # [phone] OR # [phone] if no answer  .   Erick Colace ACNP-BC [place] Pager # [phone] OR # [phone] if no answer

## 2019-05-19 NOTE — Progress Notes (Signed)
PT Cancellation Note  Patient Details Name: Dylan Goodman MRN: [id number] DOB: [birth year]   Cancelled Treatment:    Reason Eval/Treat Not Completed: Patient declined, pt stated "I'm not in the mood." Explained we wanted to help him get stronger. Pt shook his head. Will continue attempts.   [place] 05/19/2019, 1:11 PM [place] Pager [phone] Office [phone]

## 2019-05-19 NOTE — Progress Notes (Signed)
PROGRESS NOTE    Dylan Goodman  IRJ:[id number] DOB: 02/24/1951 DOA: 05/06/2019 PCP: Steele Sizer, MD    Brief Narrative:  Patient is a 68 year old male with history of atrial fibrillation, GI bleed, coronary artery disease status post CABG, nasopharyngeal cancer, cardiomyopathy, chronic depression, CKD stage 4, diabetes mellitus, GI bleed , peripheral vascular disease, previous smoker, recent Covid-19 infection about 3 weeks ago before admission who presented to [facility] with seizure.  He sustained a cardiac arrest there in the emergency department after an seizure episode.  Due to Covid positivity he was sent to [facility] and was admitted under PCCM service.  He was defibrillated after cardiac arrest.  Intubated for airway protection.  Currently extubated and maintaining his saturation on nasal cannula.  Patient transferred to [facility] service on 05/08/19.  Hospital course remarkable for development of AKI on CKD,Afib with RVR.Cardiology,nephrology following. His overall status is improving.  He Might need 1 or 2 more days before discharge.    Assessment & Plan:   Principal Problem:   Acute respiratory failure with hypoxemia (HCC) Active Problems:   Acute respiratory disease due to COVID-19 virus   GERD (gastroesophageal reflux disease)   Hypomagnesemia   Acute kidney injury superimposed on chronic kidney disease (HCC)   Atrial fibrillation with RVR (HCC)   COVID-19 virus infection   Cardiac arrest (HCC)   Prolonged QT interval   Seizure ([place])  1.  Acute hypoxic respiratory failure/recurrent COVID-19 pneumonitis  Patient was admitted and intubated on admission for airway protection.  Patient noted not to be on O2 at home.  Chest x-ray 05/07/2019 with stable bibasilar opacities.  Patient with worsening hypoxia and worsening shortness of breath with use of accessory muscles of respiration and was on 10 L nonrebreather the morning of 05/14/2019.  Patient with no significant improvement in  back on high flow oxygen with increased O2 requirements with significant hypoxia.Chest x-ray with persistent patchy bilateral pulmonary infiltrates worsening of left upper lung infiltrate.  Underlying chronic interstitial lung disease most likely.  Bilateral pleural effusions unchanged.  ABG done with a pH of 7.346, PCO2 of 41, PO2 of 55.  Patient on anticoagulation with Eliquis and as such doubt it if PE.  Patient was COVID-19 + on 05/04/2019.  Ferritin elevated but trending down.  Procalcitonin somewhat borderline at 0.32. CRP trending down.  PCCM has reassessed patient and patient started on IV steroids due to concern for probable COPD exacerbation.  Patient given Lasix 40 mg IV intermittently over the past few days.  Patient received a dose of IV Lasix on 05/18/2019 with a urine output of 2.150 L.  High resolution chest CT ordered by PCCM on 05/18/2019 with extensive patchy consolidation, reticulation and groundglass opacity throughout both lungs most prominent in the upper lobes with a strong dependent distribution new since 03/24/2019 chest CT.  Volume loss and mild architectural distortion.  No significant bronchiectasis or frank honeycombing.  Findings suggest combination pneumonia, atelectasis and possible early postinflammatory fibrosis.  Moderate to severe centrilobular and paraseptal emphysema with diffuse bronchial wall thickening suggesting underlying COPD.  Concern for possible COVID-19 pneumonitis.  Case discussed with PCCM who are recommending retrial of IV remdesivir, continuation of IV Solu-Medrol and transferred to [facility] for further evaluation and management.  We will check daily CRP, ferritin, CBC, CMET. IV Lasix ordered per critical care medicine. PCCM following and appreciate input and recommendations.   2 status post cardiac arrest It is noted that patient's initial rhythm was reported as V. tach  with conversion to A. fib with RVR and subsequently defibrillated.  Patient noted to be down for  estimated 11 minutes.  Patient currently hemodynamically stable.  2D echo done 05/08/2019 with a EF of 40 to 45%.  Patient being followed by cardiology.  Continue current cardiac regimen of metoprolol.  Diltiazem added to regimen for better rate control.  Per cardiology.  3.  New onset A. fib with RVR Patient was noted to have been taken off the diltiazem drip on 05/12/2019, and placed on metoprolol.  Patient with heart rate sustained in the 120s to 130s per RN the morning of 05/13/2019.  Oral Cardizem added to patient's current regimen of metoprolol for better rate control.  Heart rate improved.  Continue Eliquis for anticoagulation.  Keep magnesium greater than 2.  Keep potassium greater than 4.  Appreciate cardiology input and recommendations.   4.  Acute kidney injury on chronic kidney disease stage IV Baseline creatinine noted to be approximately 2.5-3.  Acute kidney injury felt secondary to ATN due to hypotension secondary to cardiac arrest.  Patient also noted to have a metabolic acidosis.  Patient was on a bicarb drip which is subsequently been discontinued.  Patient with a urine output of 2.150 L over the past 24 hours.  Creatinine trending back down currently at 2.75 from 3.09 from 3.44 from 3.65 from 3.84 from 3.69 from 5.17 on 05/09/2019.  Nephrology following and per nephrology no indication for dialysis at this time.  5.  Metabolic acidosis Likely secondary to problem #3.  Improved with bicarb drip.  Per nephrology.   6.  Recent COVID-19 infection/recurrent COVID-19 pneumonitis Patient diagnosed with COVID-19 approximately 3 weeks ago.  COVID-19 test on 05/04/2019 was positive.  Patient has not required any COVID specific treatment at this time.  Patient now with some acute respiratory distress with increased O2 requirements now on 10 L high flow oxygen.  Chest x-ray done the morning of 05/14/2019, with persistent patchy bilateral pulmonary infiltrates worsening of the left upper lung  infiltrate.  Underlying chronic interstitial lung disease most likely present.  Bilateral pleural effusions unchanged.  ABG done with a pH of 7.346, PCO2 of 41, PO2 of 55.  Patient has been reassessed by critical care medicine and patient started on IV Solu-Medrol per PCCM for COPD exacerbation versus Covid pneumonitis or developing the fibrosis from Covid per findings on high-resolution chest CT.[place] Kitchen  Continue current airborne precautions.  Continue supportive care.   7.  Hypotension Patient had required a Levophed drip due to hypotension.  Blood pressure improved off the drip.  Patient currently on oral metoprolol and Cardizem.  Blood pressure stable.  Follow.   8.  Transaminitis Felt secondary to shock liver.  Improved.  Repeat labs in the morning.  Follow.  9.  Seizure Patient had presented with seizures.  Patient noted to have a seizure episode in the emergency room and started on Keppra.  Continue Keppra.  Neurology was following.  Will need outpatient follow-up with neurology.  10.  Deconditioning/debility Evaluated by PT/ OT.  Inpatient rehab assessing.  11.  Hypokalemia/hypomagnesemia Magnesium at 2.5.  Potassium at 5.3.  We will give a dose of oral Lokelma.  Follow.    DVT prophylaxis: Eliquis Code Status: Full Family Communication: Updated patient.  No family at bedside. Disposition Plan: Transferred to [facility] further management of possible ongoing COVID-19 pneumonitis.  Discussed with critical care medicine.   Consultants:   PCCM  Cardiology  Procedures:   Intubation/extubation  Chest  x-ray 05/14/2019  High-resolution CT chest 05/18/2019  Antimicrobials:   IV Zosyn 05/06/2019>>>> 05/10/2019  IV vancomycin 05/07/2019 201 dose  IV cefepime 05/19/2019   Subjective: Patient sleeping however arousable however drowsy.  Patient complains of just generalized fatigue and weakness and not feeling too well today.  Patient with no significant improvement with  shortness of breath still on high flow O2.   Objective: Vitals:   05/19/19 0838 05/19/19 1200 05/19/19 1435 05/19/19 1600  BP:  (!) 133/96  (!) 149/119  Pulse: 97 96 96 94  Resp: 17 (!) 22 (!) 22 (!) 25  Temp:  98.3 F (36.8 C)  97.6 F (36.4 C)  TempSrc:  Oral  Oral  SpO2: 92% 93% 93% 94%  Weight:      Height:        Intake/Output Summary (Last 24 hours) at 05/19/2019 1907 Last data filed at 05/19/2019 1900 Gross per 24 hour  Intake 250 ml  Output 1525 ml  Net -1275 ml   Filed Weights   05/15/19 0413 05/15/19 2100 05/16/19 0712  Weight: 96 kg 96 kg 95.3 kg    Examination:  General exam: On nonrebreather at 10 L. Respiratory system: Fair air movement.  Less tight.  Some coarse breath sounds anterior lung fields.  Cardiovascular system: Irregularly irregular.  No JVD.  No murmurs, no rubs gallopS.  No lower extremity edema.   Gastrointestinal system: Abdomen is nontender, nondistended, soft, positive bowel sounds.  No rebound.  No guarding.  Central nervous system: Drowsy. No focal neurological deficits. Extremities: Symmetric 5 x 5 power. Skin: No rashes, lesions or ulcers Psychiatry: Judgement and insight appear normal. Mood & affect appropriate.     Data Reviewed: I have personally reviewed following labs and imaging studies  CBC: Recent Labs  Lab 05/13/19 0558 05/14/19 0517 05/15/19 0539 05/16/19 0723  WBC 13.9* 14.4* 8.9 9.8  NEUTROABS 9.9* 11.6* 7.8* 8.9*  HGB 12.3* 12.4* 12.1* 12.0*  HCT 40.0 39.3 38.7* 39.5  MCV 96.4 95.6 97.2 98.0  PLT 270 258 225 510   Basic Metabolic Panel: Recent Labs  Lab 05/13/19 0558 05/14/19 0517 05/15/19 0539 05/16/19 0723 05/18/19 0641 05/19/19 0348  NA 140 140 138 139 142 140  K 4.9 4.9 4.6 4.3 5.0 5.3*  CL 105 105 104 101 103 103  CO2 20* 20* 23 23 24 23   GLUCOSE 140* 161* 191* 202* 228* 235*  BUN 53* 64* 76* 86* 97* 96*  CREATININE 3.69* 3.84* 3.65* 3.44* 3.09* 2.75*  CALCIUM 8.9 9.1 8.4* 8.6* 9.1 8.8*  MG  1.3* 1.9  --  1.8 1.7 2.5*  PHOS  --  5.8*  --   --   --   --    GFR: Estimated Creatinine Clearance: 31.6 mL/min (A) (by C-G formula based on SCr of 2.75 mg/dL (H)). Liver Function Tests: Recent Labs  Lab 05/14/19 0517  ALBUMIN 2.5*   No results for input(s): LIPASE, AMYLASE in the last 168 hours. No results for input(s): AMMONIA in the last 168 hours. Coagulation Profile: No results for input(s): INR, PROTIME in the last 168 hours. Cardiac Enzymes: No results for input(s): CKTOTAL, CKMB, CKMBINDEX, TROPONINI in the last 168 hours. BNP (last 3 results) No results for input(s): PROBNP in the last 8760 hours. HbA1C: No results for input(s): HGBA1C in the last 72 hours. CBG: Recent Labs  Lab 05/18/19 2133 05/19/19 0050 05/19/19 0401 05/19/19 0953 05/19/19 1623  GLUCAP 160* 211* 218* 201* 228*   Lipid Profile: No  results for input(s): CHOL, HDL, LDLCALC, TRIG, CHOLHDL, LDLDIRECT in the last 72 hours. Thyroid Function Tests: No results for input(s): TSH, T4TOTAL, FREET4, T3FREE, THYROIDAB in the last 72 hours. Anemia Panel: No results for input(s): VITAMINB12, FOLATE, FERRITIN, TIBC, IRON, RETICCTPCT in the last 72 hours. Sepsis Labs: Recent Labs  Lab 05/14/19 0911 05/19/19 0348  PROCALCITON 1.78 0.32    No results found for this or any previous visit (from the past 240 hour(s)).       Radiology Studies: Ct Chest High Resolution  Result Date: 05/18/2019 CLINICAL DATA:  Inpatient. Hospitalization for COVID-19 pneumonia 04/12/2019. Persistent respiratory distress and readmitted with cardiac arrest. EXAM: CT CHEST WITHOUT CONTRAST TECHNIQUE: Multidetector CT imaging of the chest was performed following the standard protocol without intravenous contrast. High resolution imaging of the lungs, as well as inspiratory and expiratory imaging, was performed. COMPARISON:  05/14/2019 chest radiograph. 03/24/2019 high-resolution chest CT. FINDINGS: Cardiovascular: Mild  cardiomegaly. No significant pericardial effusion/thickening. Three-vessel coronary atherosclerosis status post CABG. Atherosclerotic nonaneurysmal thoracic aorta. Dilated main pulmonary artery (3.5 cm diameter), increased from 3.1 cm on 03/24/2019 chest CT. Mediastinum/Nodes: No discrete thyroid nodules. Unremarkable esophagus. No axillary adenopathy. Stable mildly enlarged 1.2 cm subcarinal node (series 13/image 75). No new pathologically enlarged mediastinal nodes. No discrete hilar adenopathy on this noncontrast scan. Lungs/Pleura: No pneumothorax. No pleural effusion. Small dependent bilateral pleural effusions. Moderate to severe centrilobular and paraseptal emphysema with diffuse bronchial wall thickening. There is extensive patchy consolidation, reticulation and ground-glass opacity throughout both lungs involving all lung lobes, most prominent in the upper lobes, with a strong dependent distribution, new since 03/24/2019 chest CT. There is associated volume loss and mild architectural distortion, without significant regions of traction bronchiectasis or frank honeycombing. No significant lobular air trapping on expiration sequence. No evidence of tracheobronchomalacia. No lung masses or significant pulmonary nodules. Upper abdomen: Simple 1.8 cm superior right liver cyst. Suggestion of diffuse gastric wall thickening in the nondistended stomach, not appreciably changed. Musculoskeletal: No aggressive appearing focal osseous lesions. Mild to moderate gynecomastia, asymmetric to the left, unchanged. Mild thoracic spondylosis. Intact sternotomy wires. IMPRESSION: 1. Cardiomegaly. Small dependent bilateral pleural effusions. Dilated main pulmonary artery, suggesting pulmonary arterial hypertension, increased. 2. Extensive patchy consolidation, reticulation and ground-glass opacity throughout both lungs, most prominent in the upper lobes, with a strong dependent distribution, new since 03/24/2019 chest CT.  Associated volume loss and mild architectural distortion. No significant bronchiectasis or frank honeycombing. Findings suggest a combination pneumonia (including possible aspiration pneumonia given dependent distribution), atelectasis and possibly early postinflammatory fibrosis. Suggest high-resolution chest CT follow-up in 3-6 months. 3. Moderate to severe centrilobular and paraseptal emphysema with diffuse bronchial wall thickening, suggesting underlying COPD. 4. Stable mild mediastinal lymphadenopathy, nonspecific, most likely reactive. 5. Suggestion of diffuse gastric wall thickening in the nondistended stomach, not appreciably changed since 03/24/2019 CT and nonspecific. Upper endoscopic correlation may be considered. Aortic Atherosclerosis (ICD10-I70.0) and Emphysema (ICD10-J43.9). Electronically Signed   By: Ilona Sorrel M.D.   On: 05/18/2019 13:54        Scheduled Meds:  apixaban  5 mg Oral BID   budesonide  1 puff Inhalation BID   calcitRIOL  0.25 mcg Oral Daily   Chlorhexidine Gluconate Cloth  6 each Topical Daily   diltiazem  300 mg Oral Daily   feeding supplement (GLUCERNA SHAKE)  237 mL Oral TID BM   insulin aspart  0-9 Units Subcutaneous TID WC   Ipratropium-Albuterol  1 puff Inhalation Q6H   levETIRAcetam  500  mg Oral BID   magnesium oxide  800 mg Oral Daily   methylPREDNISolone (SOLU-MEDROL) injection  40 mg Intravenous Q12H   metoprolol tartrate  100 mg Oral BID   pantoprazole  40 mg Oral Daily   sodium chloride flush  3 mL Intravenous Q12H   Continuous Infusions:  sodium chloride     ceFEPime (MAXIPIME) IV 2 g (05/19/19 1002)   [START ON 05/20/2019] remdesivir 100 mg in NS 250 mL       LOS: 13 days    Time spent: 40 minutes    Irine Seal, MD [facility]  If 7PM-7AM, please contact night-coverage [email or web address] 05/19/2019, 7:07 PM

## 2019-05-19 NOTE — Progress Notes (Signed)
Pharmacy Antibiotic Note  Dylan Goodman is a 68 y.o. male admitted on 05/06/2019 with likely post-COVID pneumonitis/PNA.   Patient was admitted 9/13 initially w covid and treated with remdesivir and steroids  Plan: After discussion with TRH/CCM, will re-treat with remdesivir 200 mg x 1 then 100 mg daily x 4 days Cefepime 2 g q12h Monitor LFTs, cx  Height: 5\' 11"  (180.3 cm) Weight: 210 lb 1.6 oz (95.3 kg) IBW/kg (Calculated) : 75.3  Temp (24hrs), Avg:98.1 F (36.7 C), Min:97.6 F (36.4 C), Max:98.5 F (36.9 C)  Recent Labs  Lab 05/13/19 0558 05/14/19 0517 05/15/19 0539 05/16/19 0723 05/18/19 0641 05/19/19 0348  WBC 13.9* 14.4* 8.9 9.8  --   --   CREATININE 3.69* 3.84* 3.65* 3.44* 3.09* 2.75*    Estimated Creatinine Clearance: 31.6 mL/min (A) (by C-G formula based on SCr of 2.75 mg/dL (H)).    No Known Allergies  Levester Fresh, PharmD, BCPS, BCCCP Clinical Pharmacist [phone]  Please check AMION for all [place] numbers  05/19/2019 8:40 AM

## 2019-05-19 NOTE — Progress Notes (Signed)
Upon entering room, pt was sating in the high 70s. Day Shift RN adjusted HFNC to 35lpm. This RN placed him on a NRB at 10L for back up oxygen. RT contacted to come evaluate oxygen needs. RN will continue to monitor.

## 2019-05-20 ENCOUNTER — Inpatient Hospital Stay (HOSPITAL_COMMUNITY): Payer: Medicare Other

## 2019-05-20 DIAGNOSIS — R569 Unspecified convulsions: Secondary | ICD-10-CM

## 2019-05-20 DIAGNOSIS — I4891 Unspecified atrial fibrillation: Secondary | ICD-10-CM | POA: Diagnosis not present

## 2019-05-20 DIAGNOSIS — R0902 Hypoxemia: Secondary | ICD-10-CM | POA: Diagnosis not present

## 2019-05-20 DIAGNOSIS — N179 Acute kidney failure, unspecified: Secondary | ICD-10-CM | POA: Diagnosis not present

## 2019-05-20 DIAGNOSIS — N189 Chronic kidney disease, unspecified: Secondary | ICD-10-CM | POA: Diagnosis not present

## 2019-05-20 DIAGNOSIS — J9601 Acute respiratory failure with hypoxia: Secondary | ICD-10-CM | POA: Diagnosis not present

## 2019-05-20 LAB — C-REACTIVE PROTEIN: CRP: 1.2 mg/dL — ABNORMAL HIGH (ref <1.0)

## 2019-05-20 LAB — POCT I-STAT 7, (LYTES, BLD GAS, ICA,H+H)
Bicarbonate: 24.6 mmol/L (ref 20.0–28.0)
Calcium, Ion: 1.22 mmol/L (ref 1.15–1.40)
HCT: 39 % (ref 39.0–52.0)
Hemoglobin: 13.3 g/dL (ref 13.0–17.0)
O2 Saturation: 100 %
Patient temperature: 98
Potassium: 5.2 mmol/L — ABNORMAL HIGH (ref 3.5–5.1)
Sodium: 142 mmol/L (ref 135–145)
TCO2: 26 mmol/L (ref 22–32)
pCO2 arterial: 39.5 mmHg (ref 32.0–48.0)
pH, Arterial: 7.401 (ref 7.350–7.450)
pO2, Arterial: 374 mmHg — ABNORMAL HIGH (ref 83.0–108.0)

## 2019-05-20 LAB — BASIC METABOLIC PANEL
Anion gap: 13 (ref 5–15)
BUN: 105 mg/dL — ABNORMAL HIGH (ref 8–23)
CO2: 24 mmol/L (ref 22–32)
Calcium: 8.7 mg/dL — ABNORMAL LOW (ref 8.9–10.3)
Chloride: 108 mmol/L (ref 98–111)
Creatinine, Ser: 3.2 mg/dL — ABNORMAL HIGH (ref 0.61–1.24)
GFR calc Af Amer: 22 mL/min — ABNORMAL LOW (ref >60)
GFR calc non Af Amer: 19 mL/min — ABNORMAL LOW (ref >60)
Glucose, Bld: 246 mg/dL — ABNORMAL HIGH (ref 70–99)
Potassium: 5.3 mmol/L — ABNORMAL HIGH (ref 3.5–5.1)
Sodium: 145 mmol/L (ref 135–145)

## 2019-05-20 LAB — TSH: TSH: 2.914 u[IU]/mL (ref 0.350–4.500)

## 2019-05-20 LAB — MAGNESIUM: Magnesium: 2.4 mg/dL (ref 1.7–2.4)

## 2019-05-20 LAB — COMPREHENSIVE METABOLIC PANEL
ALT: 123 U/L — ABNORMAL HIGH (ref 0–44)
AST: 62 U/L — ABNORMAL HIGH (ref 15–41)
Albumin: 2.9 g/dL — ABNORMAL LOW (ref 3.5–5.0)
Alkaline Phosphatase: 170 U/L — ABNORMAL HIGH (ref 38–126)
Anion gap: 14 (ref 5–15)
BUN: 103 mg/dL — ABNORMAL HIGH (ref 8–23)
CO2: 25 mmol/L (ref 22–32)
Calcium: 9.1 mg/dL (ref 8.9–10.3)
Chloride: 105 mmol/L (ref 98–111)
Creatinine, Ser: 3.25 mg/dL — ABNORMAL HIGH (ref 0.61–1.24)
GFR calc Af Amer: 22 mL/min — ABNORMAL LOW (ref >60)
GFR calc non Af Amer: 19 mL/min — ABNORMAL LOW (ref >60)
Glucose, Bld: 293 mg/dL — ABNORMAL HIGH (ref 70–99)
Potassium: 6 mmol/L — ABNORMAL HIGH (ref 3.5–5.1)
Sodium: 144 mmol/L (ref 135–145)
Total Bilirubin: 2 mg/dL — ABNORMAL HIGH (ref 0.3–1.2)
Total Protein: 6 g/dL — ABNORMAL LOW (ref 6.5–8.1)

## 2019-05-20 LAB — T4, FREE: Free T4: 1.24 ng/dL — ABNORMAL HIGH (ref 0.61–1.12)

## 2019-05-20 LAB — GLUCOSE, CAPILLARY
Glucose-Capillary: 282 mg/dL — ABNORMAL HIGH (ref 70–99)
Glucose-Capillary: 203 mg/dL — ABNORMAL HIGH (ref 70–99)
Glucose-Capillary: 171 mg/dL — ABNORMAL HIGH (ref 70–99)

## 2019-05-20 LAB — FERRITIN: Ferritin: 414 ng/mL — ABNORMAL HIGH (ref 24–336)

## 2019-05-20 LAB — PHOSPHORUS: Phosphorus: 7 mg/dL — ABNORMAL HIGH (ref 2.5–4.6)

## 2019-05-20 LAB — D-DIMER, QUANTITATIVE: D-Dimer, Quant: 5.1 ug/mL-FEU — ABNORMAL HIGH (ref 0.00–0.50)

## 2019-05-20 LAB — PROCALCITONIN: Procalcitonin: 0.34 ng/mL

## 2019-05-20 IMAGING — DX DG CHEST 1V PORT
1 series · 1 of 1 positions shown · non-contrast
Comparison: [DATE]

CLINICAL DATA: Acute respiratory failure with hypoxia, [MW]

EXAM:
PORTABLE CHEST 1 VIEW

[chest ap]
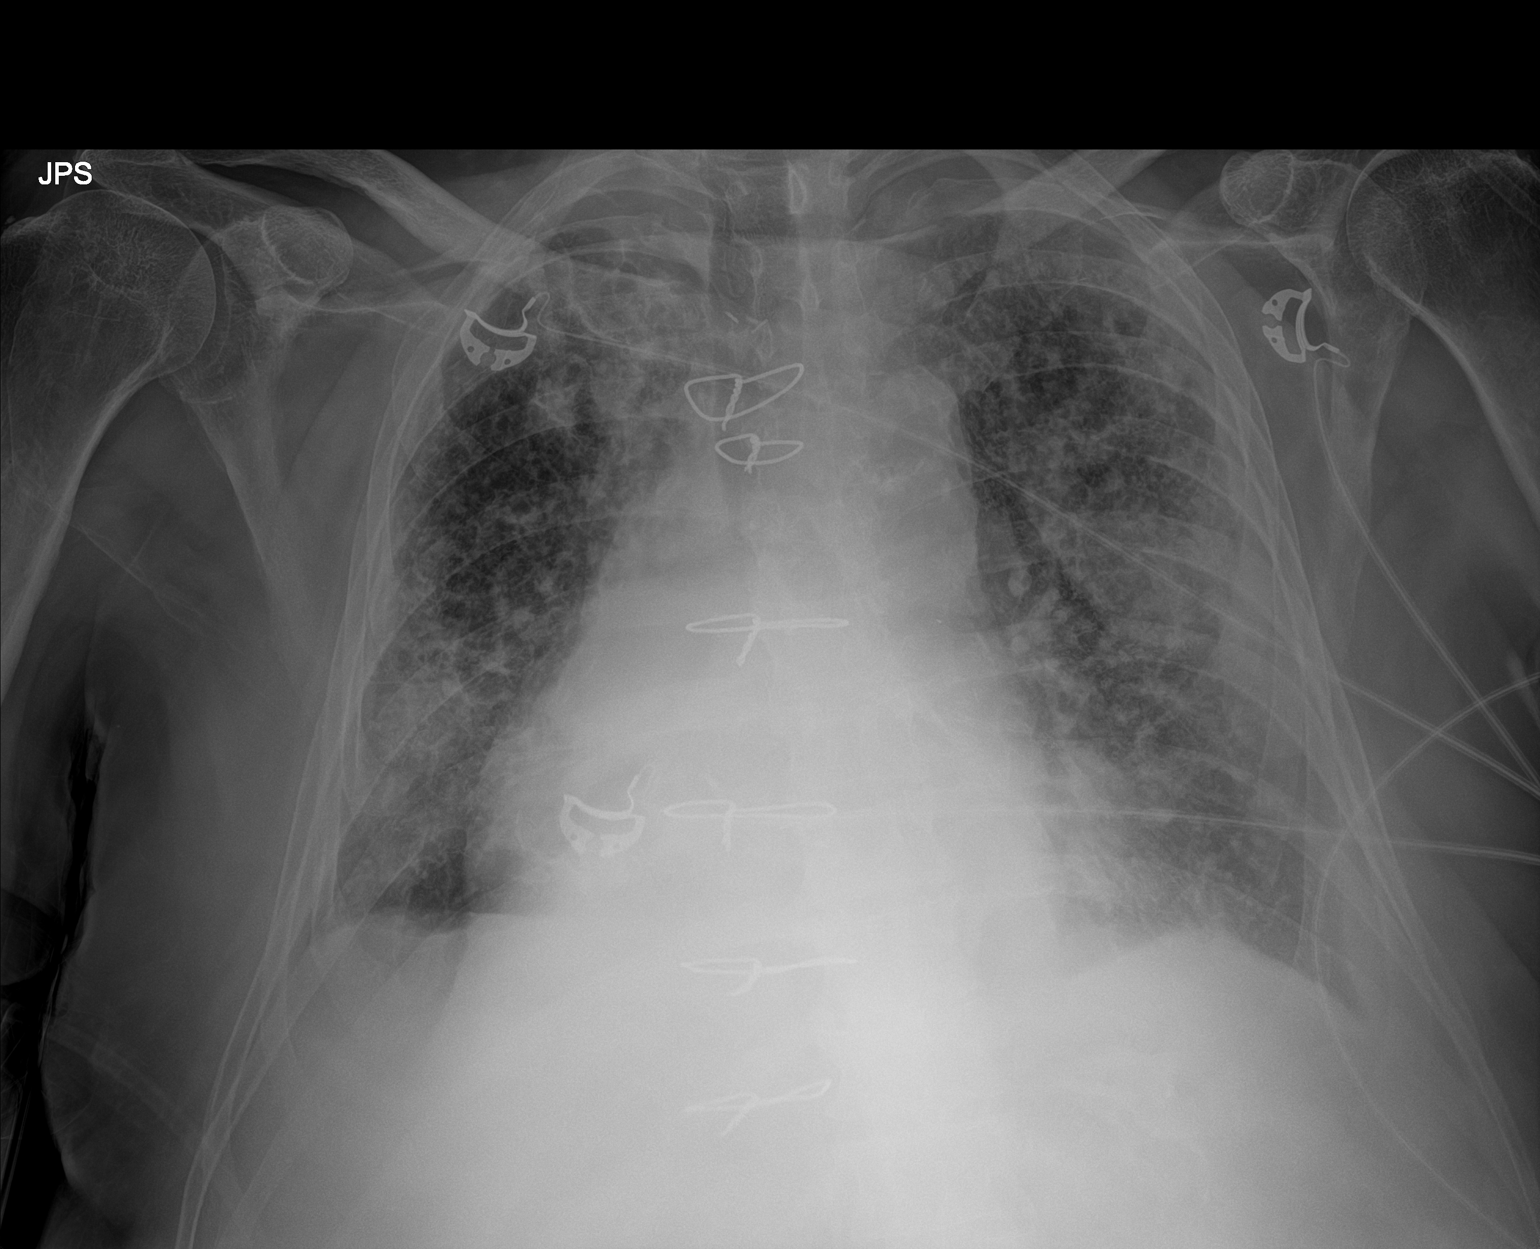

[1 of 1 positions shown; findings below may reference images not displayed]

FINDINGS: Patchy bilateral airspace disease again noted, left greater than
right. Slight increase in the right lower lung since prior study.
Cardiomegaly. Prior median sternotomy. Small bilateral effusions
again noted, unchanged.
IMPRESSION: Patchy bilateral airspace opacities, left greater than right. This
is increased in the right lower lung since prior study.

Stable small effusions.

## 2019-05-20 IMAGING — DX DG CHEST 1V PORT
1 series · 1 of 1 positions shown · non-contrast
Comparison: [DATE] at [DATE] a.m. and CT scan of the chest dated
[DATE]

CLINICAL DATA: Acute respiratory failure. [7O].

EXAM:
PORTABLE CHEST 1 VIEW [DATE] p.m.

[chest]
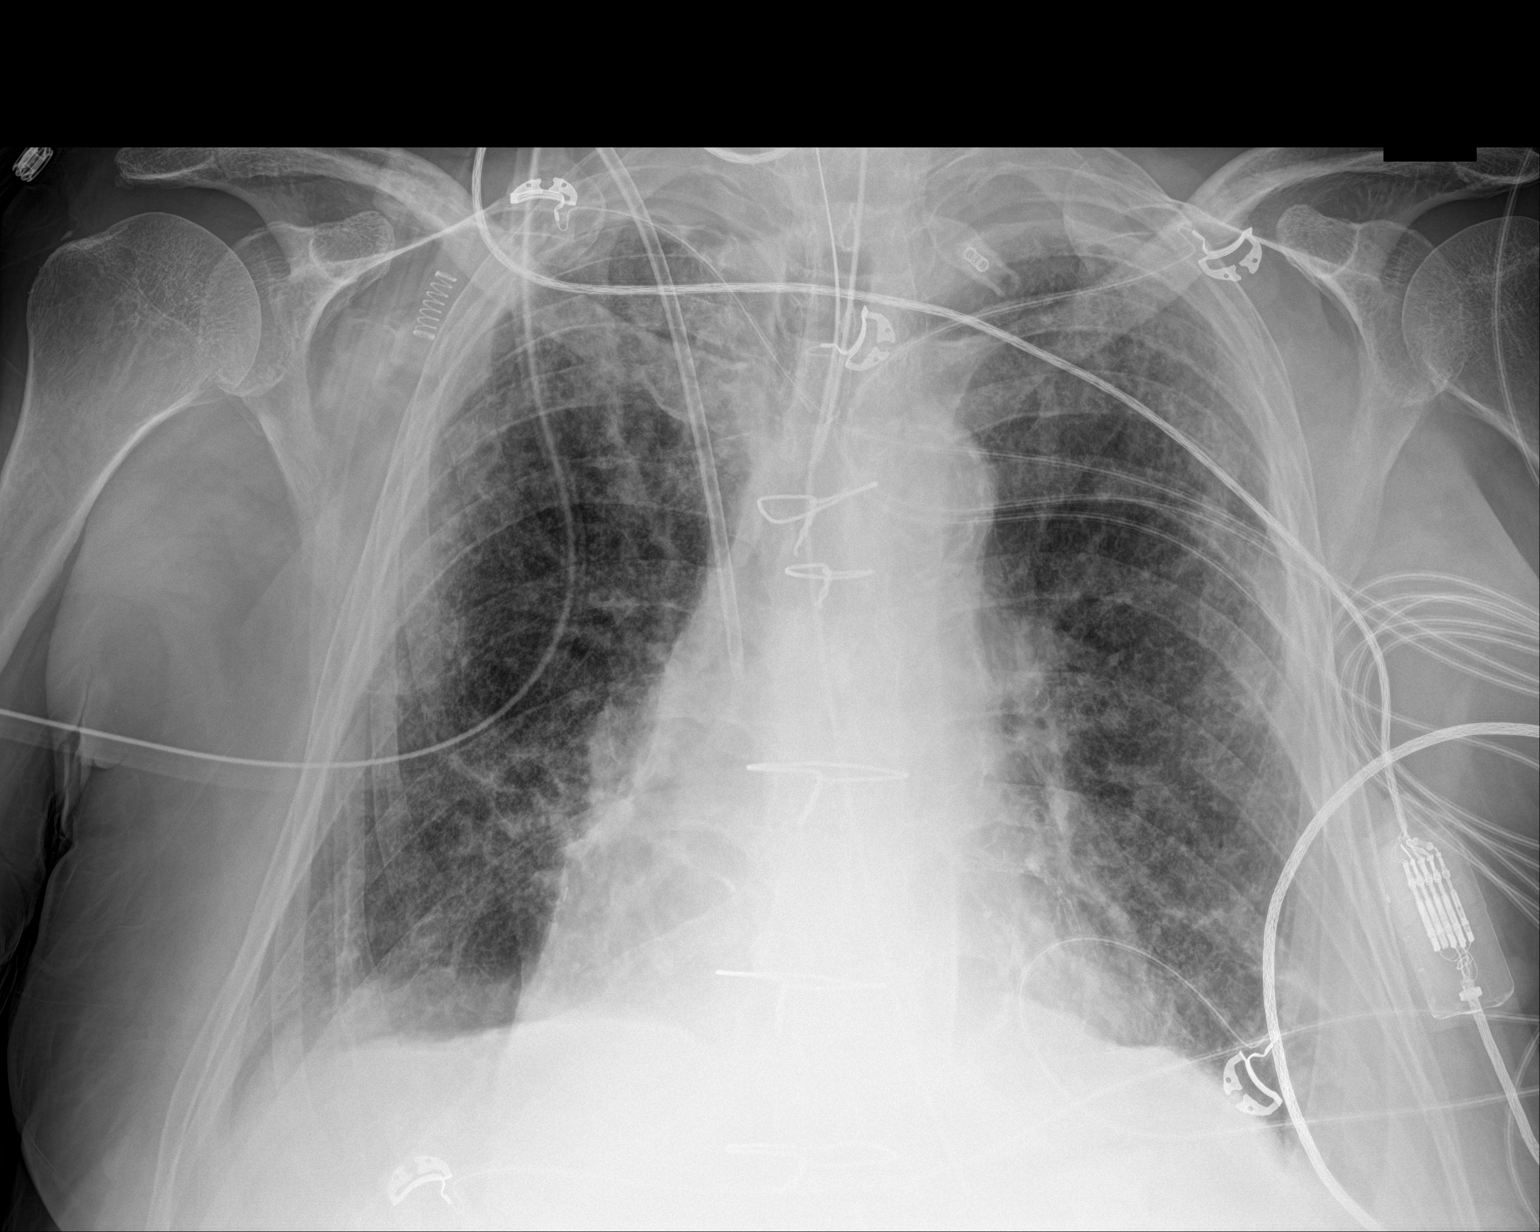

[1 of 1 positions shown; findings below may reference images not displayed]

FINDINGS: Endotracheal tube tip is in good position 4 cm above the carina.
Right-sided central line tip is in the superior vena cava just below
the carina in good position. NG tube tip is below the diaphragm.

The hazy bilateral peripheral infiltrates are improved since the
study earlier today. Small bilateral effusions as demonstrated on
the prior CT scan. Heart size is normal.

No significant bone abnormality. Aortic atherosclerosis.
IMPRESSION: 1. ET tube, NG tube, and right central line are in good position.
2. Improved bilateral pulmonary infiltrates.
3. Small bilateral pleural effusions.
4. Aortic atherosclerosis.

## 2019-05-20 IMAGING — DX DG ABD PORTABLE 1V
1 series · 1 of 1 positions shown · non-contrast
Comparison: Abdominal radiograph dated [DATE]

CLINICAL DATA: OG tube placement.  Acute respiratory failure.

EXAM:
PORTABLE ABDOMEN - 1 VIEW

[abdomen]
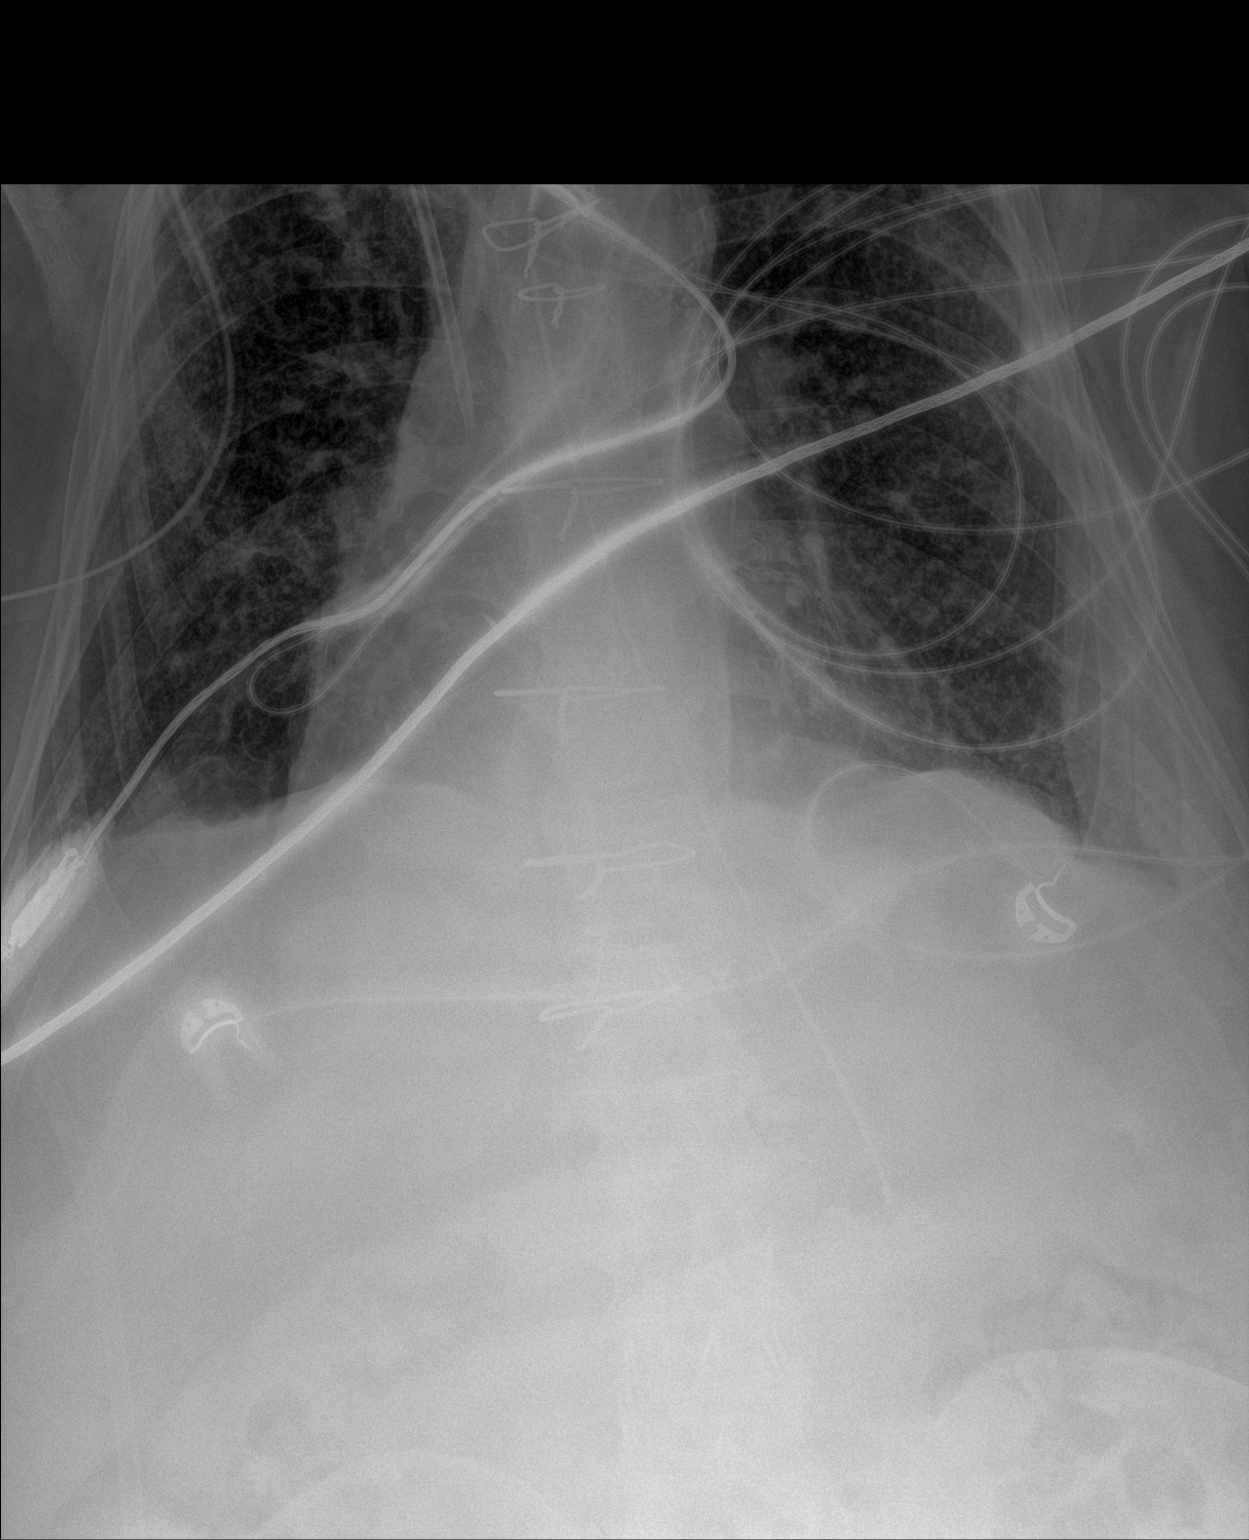

[1 of 1 positions shown; findings below may reference images not displayed]

FINDINGS: NG tube tip is in the body of the stomach. No dilated bowel. Small
bilateral pleural effusions.
IMPRESSION: NG tube tip in the body of the stomach.

## 2019-05-20 MED ORDER — DEXMEDETOMIDINE HCL IN NACL 400 MCG/100ML IV SOLN
0.4000 ug/kg/h | INTRAVENOUS | Status: DC
  Filled 2019-05-20: qty 100

## 2019-05-20 MED ORDER — DEXAMETHASONE SODIUM PHOSPHATE 10 MG/ML IJ SOLN
6.0000 mg | Freq: Every day | INTRAMUSCULAR | Status: DC
  Administered 2019-05-20 – 2019-05-21 (×2): 6 mg via INTRAVENOUS
  Filled 2019-05-20: qty 0.6
  Filled 2019-05-20 (×2): qty 1

## 2019-05-20 MED ORDER — FENTANYL CITRATE (PF) 100 MCG/2ML IJ SOLN: 25.0000 ug | INTRAMUSCULAR | Status: DC | PRN

## 2019-05-20 MED ORDER — ETOMIDATE 2 MG/ML IV SOLN
20.0000 mg | Freq: Once | INTRAVENOUS | Status: AC
  Administered 2019-05-20: 20 mg via INTRAVENOUS

## 2019-05-20 MED ORDER — SODIUM CHLORIDE 0.9 % IV SOLN
INTRAVENOUS | Status: DC
  Administered 2019-05-20 (×2): via INTRAVENOUS

## 2019-05-20 MED ORDER — BUDESONIDE 0.25 MG/2ML IN SUSP
0.2500 mg | Freq: Two times a day (BID) | RESPIRATORY_TRACT | Status: DC
  Administered 2019-05-20 – 2019-05-23 (×6): 0.25 mg via RESPIRATORY_TRACT
  Filled 2019-05-20 (×6): qty 2

## 2019-05-20 MED ORDER — MIDAZOLAM HCL 2 MG/2ML IJ SOLN
2.0000 mg | Freq: Once | INTRAMUSCULAR | Status: AC
  Administered 2019-05-20: 13:00:00 2 mg via INTRAVENOUS

## 2019-05-20 MED ORDER — HEPARIN SODIUM (PORCINE) 1000 UNIT/ML DIALYSIS
1000.0000 [IU] | INTRAMUSCULAR | Status: DC | PRN
  Administered 2019-05-28: 3600 [IU] via INTRAVENOUS_CENTRAL
  Filled 2019-05-20: qty 6

## 2019-05-20 MED ORDER — INSULIN ASPART 100 UNIT/ML ~~LOC~~ SOLN: 0.0000 [IU] | Freq: Every day | SUBCUTANEOUS | Status: DC

## 2019-05-20 MED ORDER — MELATONIN 3 MG PO TABS
5.0000 mg | ORAL_TABLET | Freq: Every day | ORAL | Status: DC
  Filled 2019-05-20: qty 1.5

## 2019-05-20 MED ORDER — INSULIN ASPART 100 UNIT/ML ~~LOC~~ SOLN
3.0000 [IU] | Freq: Three times a day (TID) | SUBCUTANEOUS | Status: DC
  Administered 2019-05-20: 3 [IU] via SUBCUTANEOUS

## 2019-05-20 MED ORDER — MIDAZOLAM HCL 2 MG/2ML IJ SOLN
INTRAMUSCULAR | Status: AC
  Administered 2019-05-20: 2 mg via INTRAVENOUS
  Filled 2019-05-20: qty 4

## 2019-05-20 MED ORDER — SODIUM ZIRCONIUM CYCLOSILICATE 10 G PO PACK
10.0000 g | PACK | Freq: Three times a day (TID) | ORAL | Status: DC
  Administered 2019-05-20 – 2019-05-22 (×6): 10 g via ORAL
  Filled 2019-05-20 (×11): qty 1

## 2019-05-20 MED ORDER — SODIUM ZIRCONIUM CYCLOSILICATE 10 G PO PACK
10.0000 g | PACK | Freq: Two times a day (BID) | ORAL | Status: DC
  Administered 2019-05-20: 10 g via ORAL
  Filled 2019-05-20 (×2): qty 1

## 2019-05-20 MED ORDER — INSULIN ASPART 100 UNIT/ML ~~LOC~~ SOLN
0.0000 [IU] | Freq: Three times a day (TID) | SUBCUTANEOUS | Status: DC
  Administered 2019-05-20: 7 [IU] via SUBCUTANEOUS
  Administered 2019-05-21: 3 [IU] via SUBCUTANEOUS

## 2019-05-20 MED ORDER — HEPARIN SODIUM (PORCINE) 1000 UNIT/ML DIALYSIS
1000.0000 [IU] | INTRAMUSCULAR | Status: DC | PRN
  Administered 2019-05-20: 2400 [IU] via INTRAVENOUS_CENTRAL
  Filled 2019-05-20: qty 6
  Filled 2019-05-20: qty 3
  Filled 2019-05-20: qty 6

## 2019-05-20 MED ORDER — SODIUM CHLORIDE 0.9 % IV SOLN
2.0000 g | INTRAVENOUS | Status: DC
  Administered 2019-05-20: 2 g via INTRAVENOUS
  Filled 2019-05-20: qty 2

## 2019-05-20 MED ORDER — IPRATROPIUM-ALBUTEROL 0.5-2.5 (3) MG/3ML IN SOLN
3.0000 mL | Freq: Four times a day (QID) | RESPIRATORY_TRACT | Status: DC
  Administered 2019-05-20 – 2019-05-21 (×4): 3 mL via RESPIRATORY_TRACT
  Filled 2019-05-20 (×4): qty 3

## 2019-05-20 MED ORDER — ROCURONIUM BROMIDE 50 MG/5ML IV SOLN
60.0000 mg | Freq: Once | INTRAVENOUS | Status: AC
  Administered 2019-05-20: 60 mg via INTRAVENOUS

## 2019-05-20 MED ORDER — PHENYLEPHRINE HCL-NACL 10-0.9 MG/250ML-% IV SOLN
INTRAVENOUS | Status: AC
  Administered 2019-05-20: 150 ug/min via INTRAVENOUS
  Filled 2019-05-20: qty 250

## 2019-05-20 MED ORDER — FENTANYL CITRATE (PF) 100 MCG/2ML IJ SOLN
100.0000 ug | Freq: Once | INTRAMUSCULAR | Status: AC
  Administered 2019-05-20: 13:00:00 100 ug via INTRAVENOUS

## 2019-05-20 MED ORDER — DEXMEDETOMIDINE HCL IN NACL 400 MCG/100ML IV SOLN
0.0000 ug/kg/h | INTRAVENOUS | Status: DC
  Administered 2019-05-20: 0.4 ug/kg/h via INTRAVENOUS
  Administered 2019-05-20: 0.8 ug/kg/h via INTRAVENOUS
  Administered 2019-05-21 (×2): 1.1 ug/kg/h via INTRAVENOUS
  Administered 2019-05-21: 0.8 ug/kg/h via INTRAVENOUS
  Administered 2019-05-21: 1 ug/kg/h via INTRAVENOUS
  Administered 2019-05-21: 0.8 ug/kg/h via INTRAVENOUS
  Administered 2019-05-22 (×2): 0.5 ug/kg/h via INTRAVENOUS
  Administered 2019-05-22: 0.8 ug/kg/h via INTRAVENOUS
  Administered 2019-05-23: 0.3 ug/kg/h via INTRAVENOUS
  Filled 2019-05-20 (×11): qty 100

## 2019-05-20 MED ORDER — PHENYLEPHRINE HCL-NACL 10-0.9 MG/250ML-% IV SOLN
0.0000 ug/min | INTRAVENOUS | Status: DC
  Administered 2019-05-20: 13:00:00 150 ug/min via INTRAVENOUS
  Administered 2019-05-21 (×2): 20 ug/min via INTRAVENOUS
  Administered 2019-05-22: 10 ug/min via INTRAVENOUS
  Administered 2019-05-22: 15 ug/min via INTRAVENOUS
  Administered 2019-05-24: 15:00:00 10 ug/min via INTRAVENOUS
  Administered 2019-05-24: 21:00:00 40 ug/min via INTRAVENOUS
  Administered 2019-05-25: 70 ug/min via INTRAVENOUS
  Filled 2019-05-20 (×8): qty 250

## 2019-05-20 MED ORDER — MELATONIN 3 MG PO TABS
6.0000 mg | ORAL_TABLET | Freq: Every day | ORAL | Status: DC
  Filled 2019-05-20 (×2): qty 2

## 2019-05-20 MED ORDER — SODIUM CHLORIDE 0.9 % IV SOLN
10.0000 mg | INTRAVENOUS | Status: DC
  Administered 2019-05-20: 10 mg via INTRAVENOUS
  Filled 2019-05-20: qty 1

## 2019-05-20 MED ORDER — FENTANYL CITRATE (PF) 100 MCG/2ML IJ SOLN
INTRAMUSCULAR | Status: AC
  Administered 2019-05-20: 100 ug via INTRAVENOUS
  Filled 2019-05-20: qty 2

## 2019-05-20 NOTE — Progress Notes (Signed)
I had a long frank discussion w/ the patients wife over the phone. We discussed current care. His refractory hypoxia and high risk for intubation. She understands that if intubated she may need to decide on long term vent vs transitioning to comfort at some point.   Erick Colace ACNP-BC [place] Pager # [phone] OR # [phone] if no answer

## 2019-05-20 NOTE — Progress Notes (Signed)
RN at bedside to assess pt. Pt was placed prone while on NRB to increase oxygenation. Pt become increasingly confused and turned over onto his side. Subsequently, his oxygen levels went from 90s to 80s. When RN tried to redirect the patient back onto his stomach to help him breathe, he started yelling at nurse, telling her to "leave me alone." RN tried to redirect him but was unable due to confusion. RN also needed patient to extend left arm so the IV medications could be administered. Pt continued to yell "leave me alone" and began swinging his arms aggressively. Pt was now supine with oxygen sats in the mid 80s. RN let CCM know the change in status. Marni Griffon, NP and Dr. Ruthann Cancer arrived at bedside to assess.

## 2019-05-20 NOTE — Progress Notes (Signed)
Per Dr Ruthann Cancer CCM, no need to adjust ETT placement s/p cxray, and no vent changes s/p ABG except weaning fio2.  Fio2 weaned to 60%, sat 100% currently.  Attempting to reach [facility] RT's for report, RN has given [facility] report.

## 2019-05-20 NOTE — Progress Notes (Signed)
Placed in prone position.  High flow oxygen removed.  Remains on 100% NRB sats now 97 to 100%  Will await nephro re-eval then can transfer to [place] ACNP-BC [place] Pager # [phone] OR # [phone] if no answer

## 2019-05-20 NOTE — Progress Notes (Signed)
[facility] PCCM Evening rounds  Arrived from [facility] Resting comfortably on vent Adequately sedated with good ventilator synchrony on vent On minimal vasopressors  On exam Vitals:   05/20/19 1400 05/20/19 1415 05/20/19 1430 05/20/19 1517  BP: 109/80 (!) 119/97 (!) 123/95 (!) 125/107  Pulse:    88  Resp: (!) 26 (!) 26 (!) 26 (!) 26  Temp:      TempSrc:      SpO2:    94%  Weight:      Height:    5\' 11"  (1.803 m)   Vent Mode: PRVC FiO2 (%):  [60 %-100 %] 60 % Set Rate:  [26 bmp] 26 bmp Vt Set:  [600 mL] 600 mL PEEP:  [10 cmH20-14 cmH20] 14 cmH20 Plateau Pressure:  [27 cmH20-30 cmH20] 27 cmH20  General:  In bed on vent HENT: NCAT ETT in place PULM: CTA B, vent supported breathing CV: RRR, no mgr GI: BS+, soft, nontender MSK: normal bulk and tone Neuro: sedated on vent  10/21 CXR images personally reviewed: scattered interstitial edema/infiltrates, ETT in place HD cath in place  HRCT from 10/19 personally reviewed: scattered interstitial infiltrates with underlying emphysema (noted when compared to 02/2019 , bibasilar opacifications/pleural effusions, interlobular septal thickening  Impression: 1) Pulmonary inflammatory change with underlying emphysema: fibrotic changes likely but difficult CT to follow 2) likely acute pulmonary edema 3) worsening AKI 4) multiple comorbidities: CAD, CKD, GERD, DM2, Atrial fibrillation  Prognosis: guarded  Plan: 1) monitor UOP, consider volume removal with CVVHD tomorrow if oxygenation worse with IVF because CT chest from 10/19 looks like volume overload to me 2) continue full vent support 3) sedation per PAD Protocol 4) continue decadron, may change to solumedrol based on Thoracic Radiology read 5) agree with HCAP coverage  Additional cc time 40 minutes  Roselie Awkward, MD [place] PCCM Pager: [phone] Cell: [phone] If no response, call [phone]

## 2019-05-20 NOTE — Progress Notes (Signed)
Inpatient Diabetes Program Recommendations  AACE/ADA: New Consensus Statement on Inpatient Glycemic Control (2015)  Target Ranges:  Prepandial:   less than 140 mg/dL      Peak postprandial:   less than 180 mg/dL (1-2 hours)      Critically ill patients:  140 - 180 mg/dL   Lab Results  Component Value Date   GLUCAP 242 (H) 05/19/2019   HGBA1C 6.7 (H) 03/12/2019    Review of Glycemic Control  DiabeResults for Dylan Goodman, Dylan Goodman (MRN [id number]) as of 05/20/2019 10:45  Ref. Range 05/19/2019 04:01 05/19/2019 09:53 05/19/2019 16:23 05/19/2019 19:39 05/19/2019 23:22  Glucose-Capillary Latest Ref Range: 70 - 99 mg/dL 218 (H) 201 (H) 228 (H) 220 (H) 242 (H)   Diabetes history: DM Outpatient Diabetes medications: Tresiba 12 units Current orders for Inpatient glycemic control: Novolog 3 units tid meal coverage + Novolog correction resistant + hs 0-5 units  Inpatient Diabetes Program Recommendations:   Will follow while in the hospital. Spoke with NP Marni Griffon regarding clinical update.  Thank you, Nani Gasser. Franchot Pollitt, RN, MSN, CDE  Diabetes Coordinator Inpatient Glycemic Control Team Team Pager [phone] (8am-5pm) 05/20/2019 10:50 AM

## 2019-05-20 NOTE — Progress Notes (Signed)
Updated wife via phone. Will intubate and place vas cath (for better access and in case he were to progress to need crrt/hd).   I did personally d/w her the very real possibility that he may never come off the vent. I explained that this procedure may only be life prolonging and not life saving in the long term. She understood his overall likely poor prognosis.   She expressed understanding and all questions answered.   Informed her that after intubation and ensuring stability for transport, pt will be transported to [facility] for continued care.

## 2019-05-20 NOTE — Progress Notes (Signed)
Subjective:  Called back to see pt.  He is a 68 year old WM with ICM- EF 40-45%, Afib, DM and also CKD (recent best crt baseline around 2).  He was hosp at [facility] from 9/14-9/19- discharged, then readmitted 10/6 to [facility]-  S/p cardiac arrest on 10/7.  He suffered A on CRF in that setting with crt peaking at 5.17 on 10/10, then improved slowly - renal service signed off - crt 2.75 yesterday but with BUN of 96.  Also on 10/20 was transferred to the ICU for hypoxia - currently on high flow O2-  There was no obvious hemodynamic comprimise but this AM crt was 3.25 with K of 6.0 (was 5.3 yest) and BUN of 103.  1725 of UOP recorded-  Had been on lasix- does not seem wet.  Unfortunately now on the verge of intubation     Objective Vital signs in last 24 hours: Vitals:   05/20/19 0810 05/20/19 0900 05/20/19 1000 05/20/19 1100  BP:  (!) 148/97 (!) 152/107 130/82  Pulse: 95 94 (!) 101 96  Resp: 16 (!) 23 (!) 24 19  Temp:      TempSrc:      SpO2: 92% 90% 100% 93%  Weight:      Height:       Weight change:   Intake/Output Summary (Last 24 hours) at 05/20/2019 1148 Last data filed at 05/20/2019 0800 Gross per 24 hour  Intake 200 ml  Output 1775 ml  Net -1575 ml    Assessment/ Plan: Pt is a 68 y.o. yo male with DM, ICM and baseline CKD- crt 2 with recent complicated course who was admitted on 05/06/2019 with cardiac arrest , A on CRF which improved but now A on CRF again in the setting of hypoxia, worsening resp status, transfer to ICU  Assessment/Plan: 1. Renal-  Baseline CKD- crt 2-  This is second event of A on CRF this hosp.  Having great UOP with lasix, not overloaded right now, possibly a little over diuresed ?  Will try back some gentle IVF even though BP is OK.  Attempt to medically treat his hyperkalemia but may get to a point where he requires RRT.  Impending transfer to [facility].  Have discussed with CCM- they will place vascath prior to transfer in case he needs CRRT over there 2.  Hyperkalemia-  Given lokelma, will schedule it- no metabolic acidosis - no resp acidosis by ABG last night but going to be intubated so may help if that is present.  Potassium would be indication for RRT if cannot get it down with medical treatment  3. Anemia- not a significant issue  4. HTN/volume- give some gentle ivf 50 per hour in case over diuresis was the issue and follow  5.  Dispo-  Unfortunately feel is poor prognosis   Louis Meckel    Labs: Basic Metabolic Panel: Recent Labs  Lab 05/14/19 0517  05/18/19 0641 05/19/19 0348 05/20/19 0452  NA 140   < > 142 140 144  K 4.9   < > 5.0 5.3* 6.0*  CL 105   < > 103 103 105  CO2 20*   < > 24 23 25   GLUCOSE 161*   < > 228* 235* 293*  BUN 64*   < > 97* 96* 103*  CREATININE 3.84*   < > 3.09* 2.75* 3.25*  CALCIUM 9.1   < > 9.1 8.8* 9.1  PHOS 5.8*  --   --   --  7.0*   < > = values in this interval not displayed.   Liver Function Tests: Recent Labs  Lab 05/14/19 0517 05/20/19 0452  AST  --  62*  ALT  --  123*  ALKPHOS  --  170*  BILITOT  --  2.0*  PROT  --  6.0*  ALBUMIN 2.5* 2.9*   No results for input(s): LIPASE, AMYLASE in the last 168 hours. No results for input(s): AMMONIA in the last 168 hours. CBC: Recent Labs  Lab 05/14/19 0517 05/15/19 0539 05/16/19 0723  WBC 14.4* 8.9 9.8  NEUTROABS 11.6* 7.8* 8.9*  HGB 12.4* 12.1* 12.0*  HCT 39.3 38.7* 39.5  MCV 95.6 97.2 98.0  PLT 258 225 256   Cardiac Enzymes: No results for input(s): CKTOTAL, CKMB, CKMBINDEX, TROPONINI in the last 168 hours. CBG: Recent Labs  Lab 05/19/19 0401 05/19/19 0953 05/19/19 1623 05/19/19 1939 05/19/19 2322  GLUCAP 218* 201* 228* 220* 242*    Iron Studies:  Recent Labs    05/20/19 0452  FERRITIN 414*   Studies/Results: Ct Chest High Resolution  Result Date: 05/18/2019 CLINICAL DATA:  Inpatient. Hospitalization for COVID-19 pneumonia 04/12/2019. Persistent respiratory distress and readmitted with cardiac arrest.  EXAM: CT CHEST WITHOUT CONTRAST TECHNIQUE: Multidetector CT imaging of the chest was performed following the standard protocol without intravenous contrast. High resolution imaging of the lungs, as well as inspiratory and expiratory imaging, was performed. COMPARISON:  05/14/2019 chest radiograph. 03/24/2019 high-resolution chest CT. FINDINGS: Cardiovascular: Mild cardiomegaly. No significant pericardial effusion/thickening. Three-vessel coronary atherosclerosis status post CABG. Atherosclerotic nonaneurysmal thoracic aorta. Dilated main pulmonary artery (3.5 cm diameter), increased from 3.1 cm on 03/24/2019 chest CT. Mediastinum/Nodes: No discrete thyroid nodules. Unremarkable esophagus. No axillary adenopathy. Stable mildly enlarged 1.2 cm subcarinal node (series 13/image 75). No new pathologically enlarged mediastinal nodes. No discrete hilar adenopathy on this noncontrast scan. Lungs/Pleura: No pneumothorax. No pleural effusion. Small dependent bilateral pleural effusions. Moderate to severe centrilobular and paraseptal emphysema with diffuse bronchial wall thickening. There is extensive patchy consolidation, reticulation and ground-glass opacity throughout both lungs involving all lung lobes, most prominent in the upper lobes, with a strong dependent distribution, new since 03/24/2019 chest CT. There is associated volume loss and mild architectural distortion, without significant regions of traction bronchiectasis or frank honeycombing. No significant lobular air trapping on expiration sequence. No evidence of tracheobronchomalacia. No lung masses or significant pulmonary nodules. Upper abdomen: Simple 1.8 cm superior right liver cyst. Suggestion of diffuse gastric wall thickening in the nondistended stomach, not appreciably changed. Musculoskeletal: No aggressive appearing focal osseous lesions. Mild to moderate gynecomastia, asymmetric to the left, unchanged. Mild thoracic spondylosis. Intact sternotomy  wires. IMPRESSION: 1. Cardiomegaly. Small dependent bilateral pleural effusions. Dilated main pulmonary artery, suggesting pulmonary arterial hypertension, increased. 2. Extensive patchy consolidation, reticulation and ground-glass opacity throughout both lungs, most prominent in the upper lobes, with a strong dependent distribution, new since 03/24/2019 chest CT. Associated volume loss and mild architectural distortion. No significant bronchiectasis or frank honeycombing. Findings suggest a combination pneumonia (including possible aspiration pneumonia given dependent distribution), atelectasis and possibly early postinflammatory fibrosis. Suggest high-resolution chest CT follow-up in 3-6 months. 3. Moderate to severe centrilobular and paraseptal emphysema with diffuse bronchial wall thickening, suggesting underlying COPD. 4. Stable mild mediastinal lymphadenopathy, nonspecific, most likely reactive. 5. Suggestion of diffuse gastric wall thickening in the nondistended stomach, not appreciably changed since 03/24/2019 CT and nonspecific. Upper endoscopic correlation may be considered. Aortic Atherosclerosis (ICD10-I70.0) and Emphysema (ICD10-J43.9). Electronically Signed   By:  Ilona Sorrel M.D.   On: 05/18/2019 13:54   Dg Chest Port 1 View  Result Date: 05/20/2019 CLINICAL DATA:  Acute respiratory failure with hypoxia, COVID-19 EXAM: PORTABLE CHEST 1 VIEW COMPARISON:  05/14/2019 FINDINGS: Patchy bilateral airspace disease again noted, left greater than right. Slight increase in the right lower lung since prior study. Cardiomegaly. Prior median sternotomy. Small bilateral effusions again noted, unchanged. IMPRESSION: Patchy bilateral airspace opacities, left greater than right. This is increased in the right lower lung since prior study. Stable small effusions. Electronically Signed   By: Rolm Baptise M.D.   On: 05/20/2019 10:20   Medications: Infusions: . sodium chloride 250 mL (05/20/19 1116)  . ceFEPime  (MAXIPIME) IV    . famotidine (PEPCID) IV 10 mg (05/20/19 1118)  . remdesivir 100 mg in NS 250 mL      Scheduled Medications: . apixaban  5 mg Oral BID  . budesonide  1 puff Inhalation BID  . calcitRIOL  0.25 mcg Oral Daily  . Chlorhexidine Gluconate Cloth  6 each Topical Daily  . dexamethasone (DECADRON) injection  6 mg Intravenous Daily  . diltiazem  300 mg Oral Daily  . feeding supplement (GLUCERNA SHAKE)  237 mL Oral TID BM  . insulin aspart  0-20 Units Subcutaneous TID WC  . insulin aspart  0-5 Units Subcutaneous QHS  . insulin aspart  3 Units Subcutaneous TID WC  . Ipratropium-Albuterol  1 puff Inhalation Q6H  . levETIRAcetam  500 mg Oral BID  . magnesium oxide  800 mg Oral Daily  . Melatonin  6 mg Oral QHS  . metoprolol tartrate  100 mg Oral BID  . sodium chloride flush  3 mL Intravenous Q12H  . sodium zirconium cyclosilicate  10 g Oral BID    have reviewed scheduled and prn medications.  Physical Exam: General: chronically ill WM-  Looks older than 49- viewed thru window    Extremities: no visible edema     05/20/2019,11:48 AM  LOS: 14 days

## 2019-05-20 NOTE — Procedures (Signed)
Intubation Procedure Note Dylan Goodman [id number] Nov 30, 1950  Procedure: Intubation Indications: Respiratory insufficiency  Procedure Details Consent: Risks of procedure as well as the alternatives and risks of each were explained to the (patient/caregiver).  Consent for procedure obtained. Time Out: Verified patient identification, verified procedure, site/side was marked, verified correct patient position, special equipment/implants available, medications/allergies/relevent history reviewed, required imaging and test results available.  Performed  Maximum sterile technique was used including antiseptics, cap, gloves, gown, hand hygiene and mask.  MAC and 4    Evaluation Hemodynamic Status: BP stable throughout; O2 sats: stable throughout Patient's Current Condition: stable Complications: No apparent complications Patient did tolerate procedure well. Chest X-ray ordered to verify placement.  CXR: pending.   Dylan Goodman 05/20/2019  Dylan Goodman ACNP-BC [place] Pager # [phone] OR # [phone] if no answer

## 2019-05-20 NOTE — Plan of Care (Signed)
  Problem: Elimination: Goal: Will not experience complications related to urinary retention Outcome: Progressing   Problem: Pain Managment: Goal: General experience of comfort will improve Outcome: Progressing   Problem: Clinical Measurements: Goal: Respiratory complications will improve Outcome: Not Progressing Note: Pt continues to need increased amounts of oxygen while being proned to maintain normal levels. CCM aware    Problem: Nutrition: Goal: Adequate nutrition will be maintained Outcome: Not Progressing Note: Pt remains NPO due to respiratory complications

## 2019-05-20 NOTE — Procedures (Signed)
Hemodialysis Catheter Insertion Procedure Note Dylan Goodman [id number] Jun 26, 1951  Procedure: Insertion of Hemodialysis Catheter Indications: Dialysis Access   Procedure Details Consent: Risks of procedure as well as the alternatives and risks of each were explained to the (patient/caregiver).  Consent for procedure obtained. Time Out: Verified patient identification, verified procedure, site/side was marked, verified correct patient position, special equipment/implants available, medications/allergies/relevent history reviewed, required imaging and test results available.  Performed Real time [place] used to ID and cannulate vessel  Maximum sterile technique was used including antiseptics, cap, gloves, gown, hand hygiene, mask and sheet. Skin prep: Chlorhexidine; local anesthetic administered Double lumen hemodialysis catheter was inserted into right internal jugular vein using the Seldinger technique.  Evaluation Blood flow good Complications: No apparent complications Patient did tolerate procedure well. Chest X-ray ordered to verify placement.  CXR: pending.   [name] 05/20/2019 Erick Colace ACNP-BC [place] Pager # [phone] OR # [phone] if no answer

## 2019-05-20 NOTE — Progress Notes (Signed)
Pt slightly disoriented and having trouble keeping his NRB mask on. Pt called his wife on his cell phone and was not clearly communicating. Wife Mardene Celeste called the unit for an update, concerned that the pt did not know it was 0200. Advised wife that pt was stable although slightly hypoxic on ABG, in ICU for overnight observation. Reoriented pt to time, pt now settled and sleeping. Will continue to monitor.

## 2019-05-20 NOTE — Progress Notes (Signed)
Pharmacy Antibiotic Note  Dylan Goodman is a 68 y.o. male admitted on 05/06/2019 with likely post-COVID pneumonitis/PNA. Patient was admitted 9/13 initially with COVID and treated with remdesivir and steroids.   Pharmacy has been consulted to dose cefepime for COVID pneumonitis. Patient has been receiving cefepime 2g IV q12h dosed for CrCl 30-17ml/min. Scr increased from 2.75 to 3.25 and current is CrCl is 24.75ml/min  Plan: Change cefepime to 2g IV q24h Monitor renal function and clinical progression  Height: 5\' 11"  (180.3 cm) Weight: 195 lb 15.8 oz (88.9 kg) IBW/kg (Calculated) : 75.3  Temp (24hrs), Avg:97.7 F (36.5 C), Min:97.3 F (36.3 C), Max:98.3 F (36.8 C)  Recent Labs  Lab 05/14/19 0517 05/15/19 0539 05/16/19 0723 05/18/19 0641 05/19/19 0348 05/20/19 0452  WBC 14.4* 8.9 9.8  --   --   --   CREATININE 3.84* 3.65* 3.44* 3.09* 2.75* 3.25*    Estimated Creatinine Clearance: 24.1 mL/min (A) (by C-G formula based on SCr of 3.25 mg/dL (H)).    No Known Allergies  Cultures this admission:  10/8 Trach aspirate: rare budding yeast  10/7 UCx negative 10/7 BTD:VVOH 10/6 BCx ngtd 10/7 MRSA PCR: negative 10/5 covid positive 9/13 covid pos  Dose adjustments this admission: 10/21: cefepime 2g IV q12h changed to cefepime 2g IV q24h  Cristela Felt, PharmD PGY1 Pharmacy Resident [employer]: [phone]  Please check AMION for all [place] numbers  05/20/2019 8:38 AM

## 2019-05-20 NOTE — Progress Notes (Signed)
NAME:  Dylan Goodman, MRN:  [id number], DOB:  1950/12/31, LOS: 74 ADMISSION DATE:  06-01-19, CONSULTATION DATE:  2023-06-01 REFERRING MD:  Dr Jimmye Norman [facility] EDP, CHIEF COMPLAINT:  Cardiac arrest   Brief History   68 year old male recently admitted with COVID PNA 9/13 now presenting with seizure and subsequently suffered brief cardiac arrest in the [facility] ED. Intubated and transferred to [facility].   Extubated.  Reconsulted 10/15 for worsening respiratory status.  10/20 PM he developed worsening hypoxia requiring escalation of supplemental O2 and was transferred to ICU.   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  Significant Hospital Events   9/14-9/19 admit to [facility] for COVID pneumonia.  10/05 to ED for Chest pain. Discharged home after negative cardiac. 10/06 To ED for seizure 06/01/23 Coded in ED while awaiting placement.  10/8 Extubated and transferred from ICU 10/15 PCCM called back for hypoxia 10/17-10/20 Remains on 100%, 30L flow  Consults:  Neurology Cardiology Nephrology PCCM  Procedures:  ETT 06/01/2023 >> 10/8  Significant Diagnostic Tests:  Renal US 9/14 >> WNL Lower extremity doppler 06-01-2023 >> CT head 10/6 >> neg CXR 10/15 >> ILD, possible some volume overload Echo 10/0 >> LVEF 45-50%, global normal RV systolic function, LA severely dilated, RA moderately dilated, mild MVR, mild TR, moderately elevated pulmonary artery systolic pressure     High-resolution CT 05/18/2019-cardiomegaly, small pleural effusion.  Extensive patchy consolidation, with reticulation, groundglass opacities.  Most prominent in the upper lobes with dependent distribution.  Moderate to severe centrilobular and paraseptal emphysema. I have reviewed the images personally.  Micro Data:  COVID 10/6 > POSITIVE Blood 06-01-23 > neg Urine 06-01-2023 > neg Trach aspirate 06/01/23 > normal flora   Antimicrobials:  Zosyn 06-01-2023 > 10/11  Interim history/subjective:  Now in ICU and on 50LPM 100% HFNC. O2 sats in the 80s.   Objective   Blood pressure (!) 151/118, pulse (!) 105, temperature (!) 97.3 F (36.3 C), temperature source Oral, resp. rate (!) 27, height 5\' 11"  (1.803 m), weight 95.3 kg, SpO2 (!) 88 %.       FiO2 (%):  [100 %] 100 %   Intake/Output Summary (Last 24 hours) at 05/20/2019 0105 Last data filed at 05/20/2019 0000 Gross per 24 hour  Intake 350 ml  Output 2025 ml  Net -1675 ml   Filed Weights   05/15/19 0413 05/15/19 2100 05/16/19 0712  Weight: 96 kg 96 kg 95.3 kg   Examination:  General:  Elderly appearing male in mild resp distress Neuro:  Alert, oriented, non-focal HEENT:  [place]/AT, No JVD noted, PERRL Cardiovascular:  RRR, no MRG Lungs:  Coarse bilateral breath sounds. Respirations even, mildly labored. Sats 93%. Abdomen:  Soft, non-distended, non-tender Musculoskeletal:  No acute deformity or ROM limitation Skin:  Intact, MMM  Resolved Hospital Problem list   N/A  Assessment & Plan:  68 year old male with diagnosis of COVID 19 over a month ago presenting initially with cardiac arrest thought due to arrhythmia.  Weaned from vent but with persistent hypoxic resp failure and pulmonary infiltrates. 10/20 with worsening hypoxia and transferred  to ICU.   Acute hypoxemic respiratory failure:  High res CT reviewed with pneumonitis, early fibrosis Ddx is  persistent Covid 19 infection, post infectious pneumonitis, acute amiodarone toxicity (pt got  amio on admission)  - Restarted on remdesivir 10/20 - Continue steroids > change to decadron - Add melatonin.  - Continue supplemental O2 HFNC (50lpm 100% now) and  - Unable to transfer to [facility] because he can not tolerate NRB alone - Incentive spirometer, awake proning.  - Nebs - Diuresis as tolerated  Possible seizure now on keppra  Afib  CHF Cardiology following Continue eliquis, cardizem, lopressor  Avoid further amiodarone  DM2 SSI  Deconditioning  From recurrent admissions, critical illness . Physical therapy  AKI Monitor urine output and creatinine   Georgann Housekeeper, AGACNP-BC [place] Pager [phone] or [phone]  05/20/2019 1:40 AM

## 2019-05-20 NOTE — Progress Notes (Addendum)
PT Cancellation Note  Patient Details Name: Dylan Goodman MRN: [id number] DOB: Jan 13, 1951   Cancelled Treatment:    Reason Eval/Treat Not Completed: (P) Medical issues which prohibited therapy Pt re-intubated this morning and is not medically ready for therapy today.   Shayaan Parke B. Migdalia Dk PT, DPT Acute Rehabilitation Services Pager [phone] Office [phone]    [place] 05/20/2019, 1:39 PM

## 2019-05-20 NOTE — Progress Notes (Signed)
eLink Physician-Brief Progress Note Patient Name: Dylan Goodman DOB: [birth year] MRN: [id number]   Date of Service  05/20/2019  HPI/Events of Note  Pt with COVID-19 pneumonia and acute respiratory failure transferred from 2 W to 25M due to increased work of breathing and impending respiratory failure that may require  Intubation.  eICU Interventions  New patient evaluation completed, I've asked PCCM on the ground to see to clarify intubation/ code status and evaluate for intubation, will check ABG now.        [name] [name] 05/20/2019, 1:07 AM

## 2019-05-20 NOTE — Progress Notes (Signed)
Nutrition Follow-up  DOCUMENTATION CODES:   Not applicable  INTERVENTION:   Recommend begin TF via OGT:   Vital 1.5 at 60 ml/h (1440 ml per day)   Pro-stat 30 ml QID   Provides 2560 kcal, 157 gm protein, 1100 ml free water daily  NUTRITION DIAGNOSIS:   Increased nutrient needs related to acute illness as evidenced by estimated needs.  Ongoing   GOAL:   Patient will meet greater than or equal to 90% of their needs  Unmet  MONITOR:   PO intake, Labs  ASSESSMENT:   68 yo male admitted with seizure and brief cardiac arrest in the [facility] ED. Required intubation and was transferred to [facility]. 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  Patient was requiring 35-40 L oxygen via NRB yesterday, desaturated to the 70's and was transferred to the ICU. Respiratory status declined further and patient required intubation today. He was then transferred to [place]. Renal function has declined. HD cath was placed in case CRRT is needed.  Prior to today, patient was consuming 90-100% of meals and received Glucerna Shakes TID between meals.   Patient is currently intubated on ventilator support MV: 15 L/min Temp (24hrs), Avg:97.6 F (36.4 C), Min:97.3 F (36.3 C), Max:98 F (36.7 C)   Labs reviewed. Potassium 5.3 (H), BUN 105 (H), creatinine 3.2 (H) CBG's: 242-282  Medications reviewed and include calcitriol, decadron, novolog, mag-ox, lokelma, precedex, neosynephrine.  Weight trending back down, now below admission weight of 92.1 kg. I/O -5.1 L   Diet Order:   Diet Order    None      EDUCATION NEEDS:   Not appropriate for education at this time  Skin:  Skin Assessment: Reviewed RN Assessment  Last BM:  10/19  Height:   Ht Readings from Last 1 Encounters:  05/20/19 5\' 11"  (1.803 m)    Weight:   Wt Readings from Last 1 Encounters:  05/20/19 88.9 kg     Ideal Body Weight:  78.2 kg  BMI:  Body mass index is 27.33 kg/m.  Estimated Nutritional Needs:   Kcal:  2580  Protein:  140-160 gm  Fluid:  >/= 2.1 L    Molli Barrows, RD, LDN, [place] Pager [phone] After Hours Pager [phone]

## 2019-05-20 NOTE — Progress Notes (Signed)
eLink Physician-Brief Progress Note Patient Name: Dylan Goodman DOB: 12/12/1950 MRN: [id number]   Date of Service  05/20/2019  HPI/Events of Note  Pt continues to be hypoxic.  eICU Interventions  RT requested to add 100 % rebreather mask to pt's high flow nasal cannula. Sats 90-94 % with non-rebreather addition but he pulled it off and sat back into the 80's. RT instructed to put the mask back on, will order soft wrist restraints as patient reportedly kept pulling his oxygen off though out last night.        [name] Ogan 05/20/2019, 1:39 AM

## 2019-05-20 NOTE — Progress Notes (Signed)
Received patient from [facility]

## 2019-05-20 NOTE — Progress Notes (Signed)
Pt progressively more confused as night goes on, SpO2 consistently in mid 70's low 80's despite both heated HFNC and NRB combo. Pt attempting to remove mask occasionally, restraints placed per Dr. Doreene Dattilo insistence. Pts SpO2 remains as stated despite addition of wrist restraints. Pt was AOx4 on arrival to unit, now DO x4. Will continue to monitor.

## 2019-05-21 DIAGNOSIS — N179 Acute kidney failure, unspecified: Secondary | ICD-10-CM | POA: Diagnosis not present

## 2019-05-21 DIAGNOSIS — U071 COVID-19: Secondary | ICD-10-CM | POA: Diagnosis not present

## 2019-05-21 DIAGNOSIS — J9601 Acute respiratory failure with hypoxia: Secondary | ICD-10-CM | POA: Diagnosis not present

## 2019-05-21 DIAGNOSIS — I4891 Unspecified atrial fibrillation: Secondary | ICD-10-CM | POA: Diagnosis not present

## 2019-05-21 LAB — CBC
HCT: 45.2 % (ref 39.0–52.0)
Hemoglobin: 13.7 g/dL (ref 13.0–17.0)
MCH: 30.1 pg (ref 26.0–34.0)
MCHC: 30.3 g/dL (ref 30.0–36.0)
MCV: 99.3 fL (ref 80.0–100.0)
Platelets: 197 10*3/uL (ref 150–400)
RBC: 4.55 MIL/uL (ref 4.22–5.81)
RDW: 20.2 % — ABNORMAL HIGH (ref 11.5–15.5)
WBC: 14.6 10*3/uL — ABNORMAL HIGH (ref 4.0–10.5)
nRBC: 0.2 % (ref 0.0–0.2)

## 2019-05-21 LAB — COMPREHENSIVE METABOLIC PANEL
ALT: 105 U/L — ABNORMAL HIGH (ref 0–44)
AST: 46 U/L — ABNORMAL HIGH (ref 15–41)
Albumin: 2.9 g/dL — ABNORMAL LOW (ref 3.5–5.0)
Alkaline Phosphatase: 149 U/L — ABNORMAL HIGH (ref 38–126)
Anion gap: 17 — ABNORMAL HIGH (ref 5–15)
BUN: 107 mg/dL — ABNORMAL HIGH (ref 8–23)
CO2: 23 mmol/L (ref 22–32)
Calcium: 9.1 mg/dL (ref 8.9–10.3)
Chloride: 107 mmol/L (ref 98–111)
Creatinine, Ser: 3.3 mg/dL — ABNORMAL HIGH (ref 0.61–1.24)
GFR calc Af Amer: 22 mL/min — ABNORMAL LOW (ref >60)
GFR calc non Af Amer: 19 mL/min — ABNORMAL LOW (ref >60)
Glucose, Bld: 135 mg/dL — ABNORMAL HIGH (ref 70–99)
Potassium: 4.9 mmol/L (ref 3.5–5.1)
Sodium: 147 mmol/L — ABNORMAL HIGH (ref 135–145)
Total Bilirubin: 1.6 mg/dL — ABNORMAL HIGH (ref 0.3–1.2)
Total Protein: 5.9 g/dL — ABNORMAL LOW (ref 6.5–8.1)

## 2019-05-21 LAB — POCT I-STAT 7, (LYTES, BLD GAS, ICA,H+H)
Acid-Base Excess: 1 mmol/L (ref 0.0–2.0)
Bicarbonate: 21.5 mmol/L (ref 20.0–28.0)
Calcium, Ion: 1.2 mmol/L (ref 1.15–1.40)
HCT: 39 % (ref 39.0–52.0)
Hemoglobin: 13.3 g/dL (ref 13.0–17.0)
O2 Saturation: 100 %
Patient temperature: 36.5
Potassium: 4.7 mmol/L (ref 3.5–5.1)
Sodium: 144 mmol/L (ref 135–145)
TCO2: 22 mmol/L (ref 22–32)
pCO2 arterial: 24 mmHg — ABNORMAL LOW (ref 32.0–48.0)
pH, Arterial: 7.559 — ABNORMAL HIGH (ref 7.350–7.450)
pO2, Arterial: 143 mmHg — ABNORMAL HIGH (ref 83.0–108.0)

## 2019-05-21 LAB — MAGNESIUM
Magnesium: 2.3 mg/dL (ref 1.7–2.4)
Magnesium: 2.1 mg/dL (ref 1.7–2.4)

## 2019-05-21 LAB — PHOSPHORUS
Phosphorus: 4.9 mg/dL — ABNORMAL HIGH (ref 2.5–4.6)
Phosphorus: 5.2 mg/dL — ABNORMAL HIGH (ref 2.5–4.6)

## 2019-05-21 LAB — GLUCOSE, CAPILLARY
Glucose-Capillary: 118 mg/dL — ABNORMAL HIGH (ref 70–99)
Glucose-Capillary: 128 mg/dL — ABNORMAL HIGH (ref 70–99)
Glucose-Capillary: 135 mg/dL — ABNORMAL HIGH (ref 70–99)
Glucose-Capillary: 294 mg/dL — ABNORMAL HIGH (ref 70–99)

## 2019-05-21 MED ORDER — CALCITRIOL 1 MCG/ML PO SOLN
0.2500 ug | Freq: Every day | ORAL | Status: DC
  Administered 2019-05-21 – 2019-05-23 (×3): 0.25 ug
  Filled 2019-05-21 (×4): qty 0.25

## 2019-05-21 MED ORDER — SODIUM CHLORIDE 0.9 % IV SOLN
10.0000 mg | Freq: Two times a day (BID) | INTRAVENOUS | Status: DC
  Filled 2019-05-21: qty 1

## 2019-05-21 MED ORDER — MAGNESIUM OXIDE 400 (241.3 MG) MG PO TABS
800.0000 mg | ORAL_TABLET | Freq: Every day | ORAL | Status: DC
  Filled 2019-05-21: qty 2

## 2019-05-21 MED ORDER — ORAL CARE MOUTH RINSE
15.0000 mL | OROMUCOSAL | Status: DC
  Administered 2019-05-21 – 2019-05-23 (×20): 15 mL via OROMUCOSAL

## 2019-05-21 MED ORDER — LEVETIRACETAM 100 MG/ML PO SOLN
500.0000 mg | Freq: Two times a day (BID) | ORAL | Status: DC
  Administered 2019-05-21 – 2019-05-23 (×6): 500 mg
  Filled 2019-05-21 (×7): qty 5

## 2019-05-21 MED ORDER — DILTIAZEM HCL 60 MG PO TABS
60.0000 mg | ORAL_TABLET | Freq: Four times a day (QID) | ORAL | Status: DC
  Administered 2019-05-22: 60 mg
  Filled 2019-05-21 (×6): qty 1

## 2019-05-21 MED ORDER — APIXABAN 5 MG PO TABS
5.0000 mg | ORAL_TABLET | Freq: Two times a day (BID) | ORAL | Status: DC
  Administered 2019-05-21 – 2019-05-23 (×4): 5 mg
  Filled 2019-05-21 (×4): qty 1

## 2019-05-21 MED ORDER — PRO-STAT SUGAR FREE PO LIQD
30.0000 mL | Freq: Four times a day (QID) | ORAL | Status: DC
  Administered 2019-05-21 – 2019-05-23 (×7): 30 mL
  Filled 2019-05-21 (×7): qty 30

## 2019-05-21 MED ORDER — CHLORHEXIDINE GLUCONATE 0.12% ORAL RINSE (MEDLINE KIT)
15.0000 mL | Freq: Two times a day (BID) | OROMUCOSAL | Status: DC
  Administered 2019-05-21 – 2019-05-23 (×5): 15 mL via OROMUCOSAL

## 2019-05-21 MED ORDER — HYDROXYZINE HCL 25 MG PO TABS
25.0000 mg | ORAL_TABLET | Freq: Three times a day (TID) | ORAL | Status: DC | PRN
  Filled 2019-05-21: qty 1

## 2019-05-21 MED ORDER — MELATONIN 3 MG PO TABS
6.0000 mg | ORAL_TABLET | Freq: Every day | ORAL | Status: DC
  Administered 2019-05-21 – 2019-05-23 (×3): 6 mg
  Filled 2019-05-21 (×3): qty 2

## 2019-05-21 MED ORDER — FAMOTIDINE 40 MG/5ML PO SUSR
20.0000 mg | Freq: Every day | ORAL | Status: DC
  Administered 2019-05-22 – 2019-05-23 (×2): 20 mg
  Filled 2019-05-21 (×3): qty 2.5

## 2019-05-21 MED ORDER — INSULIN ASPART 100 UNIT/ML ~~LOC~~ SOLN
0.0000 [IU] | SUBCUTANEOUS | Status: DC
  Administered 2019-05-21: 7 [IU] via SUBCUTANEOUS
  Administered 2019-05-21: 3 [IU] via SUBCUTANEOUS
  Administered 2019-05-22: 15 [IU] via SUBCUTANEOUS
  Administered 2019-05-22: 11 [IU] via SUBCUTANEOUS
  Administered 2019-05-22: 15 [IU] via SUBCUTANEOUS
  Administered 2019-05-22: 11 [IU] via SUBCUTANEOUS
  Administered 2019-05-22 (×2): 7 [IU] via SUBCUTANEOUS
  Administered 2019-05-23: 11 [IU] via SUBCUTANEOUS
  Administered 2019-05-23 (×4): 4 [IU] via SUBCUTANEOUS
  Administered 2019-05-23 (×2): 11 [IU] via SUBCUTANEOUS
  Administered 2019-05-24 (×2): 3 [IU] via SUBCUTANEOUS
  Administered 2019-05-25: 4 [IU] via SUBCUTANEOUS

## 2019-05-21 MED ORDER — METOPROLOL TARTRATE 25 MG PO TABS
12.5000 mg | ORAL_TABLET | Freq: Two times a day (BID) | ORAL | Status: DC
  Administered 2019-05-21 – 2019-05-22 (×4): 12.5 mg
  Filled 2019-05-21: qty 1

## 2019-05-21 MED ORDER — PRO-STAT SUGAR FREE PO LIQD
30.0000 mL | Freq: Two times a day (BID) | ORAL | Status: DC
  Administered 2019-05-21: 30 mL

## 2019-05-21 MED ORDER — FUROSEMIDE 10 MG/ML IJ SOLN
100.0000 mg | Freq: Three times a day (TID) | INTRAVENOUS | Status: DC
  Administered 2019-05-21 – 2019-05-23 (×6): 100 mg via INTRAVENOUS
  Filled 2019-05-21 (×7): qty 10

## 2019-05-21 MED ORDER — VITAL 1.5 CAL PO LIQD
1000.0000 mL | ORAL | Status: DC
  Administered 2019-05-21 – 2019-05-23 (×3): 1000 mL
  Filled 2019-05-21 (×5): qty 1000

## 2019-05-21 MED ORDER — VITAL HIGH PROTEIN PO LIQD
1000.0000 mL | ORAL | Status: DC
  Administered 2019-05-21: 1000 mL

## 2019-05-21 NOTE — Progress Notes (Signed)
NAME:  Dylan Goodman, MRN:  [id number], DOB:  06-03-1951, LOS: 5 ADMISSION DATE:  2019-05-31, CONSULTATION DATE:  05-31-2023 REFERRING MD:  Reino Kent EDP, CHIEF COMPLAINT:  Cardiac arrest   Brief History   68 y/o male treated for COVID pneumonia on September 13 returned to the ED on 05/31/2023 with seizure and cardiac arrest.  Transitioned to floor where he had AKI which resolved but then later in his admission developed worsening respiratory failure and renal failure again.  Ultimately re-intubated on 10/21 and sent back to [facility].    Past Medical History  Systolic heart failure LVEF 40-45% Atrial fib GI bleed CAD Depression CKD DM2 Gastric ulcer GERD CABG Iron def anemia Skin cancer  Significant Hospital Events   9/14-9/19 admit to [facility] for COVID pneumonia.  10/05 to ED for Chest pain. Discharged home after negative cardiac. 10/06 To ED for seizure 31-May-2023 Coded in ED while awaiting placement 10/15 PCCM called back  10/17 Remains on 100%, 30L flow 10/20 placed back on remdesevir, when attempting to transfer to [facility] desaturated. Moved to ICU 10/21 renal fxn worse   Consults:  Neurology Cardiology Nephrology PCCM  Procedures:  ETT 2023-05-31 >> 10/8 ETT 10/21 >> R IJ HD Cath 10/21 >>  Significant Diagnostic Tests:  Renal US 9/14 >> WNL Lower extremity doppler May 31, 2023 >> CT head 10/6 >> neg CXR 10/15 >> ILD, possible some volume overload Echo 10/9 >> LVEF 45-50%, global normal RV systolic function, LA severely dilated, RA moderately dilated, mild MVR, mild TR, moderately elevated pulmonary artery systolic pressure 09/38 CT chest images personally reviewed: underlying emphysema, bilateral effusion, cardiomegally, bilateral ground glass and consolidation  Micro Data:  2023/05/31 blood > neg 10/8 resp > opf 05-31-23 urine > neg  Antimicrobials/COVID RX  10/20 cefepime > 10/22 10/20 remdesivir >   Interim history/subjective:   Oxygenation improved On minimal vent settings Some urine  output overnight  Objective   Blood pressure 104/61, pulse (!) 53, temperature 97.9 F (36.6 C), temperature source Oral, resp. rate 20, height 5\' 11"  (1.803 m), weight 92.6 kg, SpO2 100 %.    Vent Mode: PRVC FiO2 (%):  [40 %-100 %] 40 % Set Rate:  [22 bmp-26 bmp] 22 bmp Vt Set:  [600 mL] 600 mL PEEP:  [10 cmH20-14 cmH20] 14 cmH20 Plateau Pressure:  [2 cmH20-32 cmH20] 29 cmH20   Intake/Output Summary (Last 24 hours) at 05/21/2019 0813 Last data filed at 05/21/2019 0700 Gross per 24 hour  Intake 1893.78 ml  Output 500 ml  Net 1393.78 ml   Filed Weights   05/16/19 0712 05/20/19 0314 05/21/19 0500  Weight: 95.3 kg 88.9 kg 92.6 kg    Examination:  General:  In bed on vent HENT: NCAT ETT in place PULM: Few crackles bases B, vent supported breathing CV: RRR, no mgr GI: BS+, soft, nontender MSK: normal bulk and tone Neuro: sedated on vent but will wake up and follow commands, nod head appropriately   10/21 CXR > ETT in place, NG in place, CVL in good position, small bilateral effusions  Resolved Hospital Problem list     Assessment & Plan:  AKI: appears dry/euvolemic on physical exam but lungs look wet on CT chest from 10/19 (pleural effusions, pulmonary hypertension, interlobular septal thickening) Will discuss lasix vs volume removal with CVVHD with renal today  Acute respiratory failure with hypoxemia: oxygenation OK, lungs aren't stiff on ventilator, I think that this may be mostly pulmonary edema and emphysema rather than ILD COVID 19 :  late phase ARDS? Continue full vent support Wean PEEP/FiO2 to maintain O2 saturation > 85% VAP prevention Daily WUA/SBT Discuss HRCT with chest radiology team today Continue methylprednisolone for now Continue remdesivir for now Stop cefepime  Need for sedation for mechanical ventilation RASS goal -1 to -2 precedex per PAD Prn fentanyl  Underlying emphysema Scheduled duoneb  Seizure: on admission, related to cardiac  arrest/hypoxemia, none since Continue keppra 500mg  bid  Systolic heart failure: lungs appear wet to me on chest imaging Need to remove volume, see above  Atrial fib Eliquis tele  Best practice:  Diet: tube feeding Pain/Anxiety/Delirium protocol (if indicated): yes, RASS target -1 to -2 VAP protocol (if indicated): yes DVT prophylaxis: eliquis GI prophylaxis: famotidine, renal dosing Glucose control: per TRH Mobility: bed rest Code Status: full Family Communication: I updated his wife Mardene Celeste by phone this afternoon Disposition: remain in ICU  Labs   CBC: Recent Labs  Lab 05/15/19 0539 05/16/19 0723 05/20/19 1340 05/21/19 0510 05/21/19 0542  WBC 8.9 9.8  --  14.6*  --   NEUTROABS 7.8* 8.9*  --   --   --   HGB 12.1* 12.0* 13.3 13.7 13.3  HCT 38.7* 39.5 39.0 45.2 39.0  MCV 97.2 98.0  --  99.3  --   PLT 225 256  --  197  --     Basic Metabolic Panel: Recent Labs  Lab 05/16/19 0723 05/18/19 0641 05/19/19 0348 05/20/19 0452 05/20/19 1340 05/20/19 1416 05/21/19 0510 05/21/19 0542  NA 139 142 140 144 142 145 147* 144  K 4.3 5.0 5.3* 6.0* 5.2* 5.3* 4.9 4.7  CL 101 103 103 105  --  108 107  --   CO2 23 24 23 25   --  24 23  --   GLUCOSE 202* 228* 235* 293*  --  246* 135*  --   BUN 86* 97* 96* 103*  --  105* 107*  --   CREATININE 3.44* 3.09* 2.75* 3.25*  --  3.20* 3.30*  --   CALCIUM 8.6* 9.1 8.8* 9.1  --  8.7* 9.1  --   MG 1.8 1.7 2.5* 2.4  --   --   --   --   PHOS  --   --   --  7.0*  --   --   --   --    GFR: Estimated Creatinine Clearance: 25.9 mL/min (A) (by C-G formula based on SCr of 3.3 mg/dL (H)). Recent Labs  Lab 05/14/19 0911 05/15/19 0539 05/16/19 0723 05/19/19 0348 05/20/19 0452 05/21/19 0510  PROCALCITON 1.78  --   --  0.32 0.34  --   WBC  --  8.9 9.8  --   --  14.6*    Liver Function Tests: Recent Labs  Lab 05/20/19 0452 05/21/19 0510  AST 62* 46*  ALT 123* 105*  ALKPHOS 170* 149*  BILITOT 2.0* 1.6*  PROT 6.0* 5.9*  ALBUMIN 2.9*  2.9*   No results for input(s): LIPASE, AMYLASE in the last 168 hours. No results for input(s): AMMONIA in the last 168 hours.  ABG    Component Value Date/Time   PHART 7.559 (H) 05/21/2019 0542   PCO2ART 24.0 (L) 05/21/2019 0542   PO2ART 143.0 (H) 05/21/2019 0542   HCO3 21.5 05/21/2019 0542   TCO2 22 05/21/2019 0542   ACIDBASEDEF 2.9 (H) 05/14/2019 0625   O2SAT 100.0 05/21/2019 0542     Coagulation Profile: No results for input(s): INR, PROTIME in the last 168 hours.  Cardiac  Enzymes: No results for input(s): CKTOTAL, CKMB, CKMBINDEX, TROPONINI in the last 168 hours.  HbA1C: Hgb A1c MFr Bld  Date/Time Value Ref Range Status  03/12/2019 07:14 AM 6.7 (H) 4.8 - 5.6 % Final    Comment:    (NOTE) Pre diabetes:          5.7%-6.4% Diabetes:              >6.4% Glycemic control for   <7.0% adults with diabetes   09/08/2018 12:03 PM 6.3 (H) <5.7 % of total Hgb Final    Comment:    For someone without known diabetes, a hemoglobin  A1c value between 5.7% and 6.4% is consistent with prediabetes and should be confirmed with a  follow-up test. . For someone with known diabetes, a value <7% indicates that their diabetes is well controlled. A1c targets should be individualized based on duration of diabetes, age, comorbid conditions, and other considerations. . This assay result is consistent with an increased risk of diabetes. . Currently, no consensus exists regarding use of hemoglobin A1c for diagnosis of diabetes for children. .     CBG: Recent Labs  Lab 05/19/19 1939 05/19/19 2322 05/20/19 0803 05/20/19 1730 05/20/19 2110  GLUCAP 220* 242* 282* 203* 171*       Critical care time: 40 minutes     Roselie Awkward, MD [place] Pager: [phone] Cell: [phone] If no response, call [phone]

## 2019-05-21 NOTE — Progress Notes (Signed)
Dylan Goodman  OFB:[id number] DOB: Dec 27, 1950 DOA: 2019-05-20 PCP: Steele Sizer, MD    Brief Narrative:  68 year old with a history of atrial fibrillation, GIB, CAD status post CABG, nasopharyngeal cancer, CKD, DM, and GERD who was previously treated for Covid pneumonia 9/17 and returned to the [facility] ED with reported seizure-like behavior, and subsequently suffered a cardiac arrest.  He was intubated and transferred to [facility] ICU.  Significant Events: 9/14 > 9/19 admitted to the [facility] for Covid pneumonia 10/5 ED evaluation with chest pain -discharged home 10/6 ED evaluation for seizure -CT head negative for acute findings 05-20-2023 coded while in ED awaiting placement -intubated 10/8 extubated -transferred out of ICU 10/15 hypoxia requiring PCCM consultation 10/19 high resolution CT -cardiomegaly, small pleural effusions, extensive patchy consolidation from last opacities -moderate to severe emphysema 10/21 intubation -vasc cath placement -transfer to [facility]  COVID-19 specific Treatment: Remdesivir 10/20 >  Subjective: Tolerating vent well.  Will follow some simple commands.  No acute distress appreciable.  Assessment & Plan:  Acute hypoxic respiratory failure unclear etiology Ventilator management per PCCM -possible fibrosis versus edema  Covid pneumonia September 2020 Now on a second course of remdesivir and Decadron  Seizure At presentation -questionably related to cardiac arrest -on Keppra for now  Chronic atrial fibrillation Rate presently controlled - chronically on Eliquis  Mild systolic CHF TTE 78/2 noted EF 45-50%  DM 2 CBG well controlled at this time  Acute kidney injury on CKD Creatinine peaked at 5.17 10/10 -baseline creatinine 2 -nephrology following -has not yet required CRRT  Hyperkalemia Being treated with Lokelma  Mild transaminitis Possible mild shock liver versus effect of remdesivir versus right heart failure/congestion - follow trend   Deconditioning  DVT prophylaxis: Eliquis Code Status: FULL CODE Family Communication: Per PCCM Disposition Plan: ICU  Consultants:  Neurology Cardiology Nephrology PCCM  Antimicrobials:  Zosyn 05/20/23 > 10/11  Objective: Blood pressure 105/65, pulse (!) 106, temperature 99.9 F (37.7 C), temperature source Oral, resp. rate (!) 22, height '5\' 11"'  (1.803 m), weight 92.6 kg, SpO2 100 %.  Intake/Output Summary (Last 24 hours) at 05/21/2019 1633 Last data filed at 05/21/2019 1600 Gross per 24 hour  Intake 2855.71 ml  Output 965 ml  Net 1890.71 ml   Filed Weights   05/16/19 0712 05/20/19 0314 05/21/19 0500  Weight: 95.3 kg 88.9 kg 92.6 kg    Examination: General: Stable on ventilator Lungs: Fine crackles scattered throughout Cardiovascular: Irregularly irregular with rate controlled without murmur Abdomen: Overweight, soft, bowel sounds present, no rebound Extremities: No significant lower extremity edema  CBC: Recent Labs  Lab 05/15/19 0539 05/16/19 0723 05/20/19 1340 05/21/19 0510 05/21/19 0542  WBC 8.9 9.8  --  14.6*  --   NEUTROABS 7.8* 8.9*  --   --   --   HGB 12.1* 12.0* 13.3 13.7 13.3  HCT 38.7* 39.5 39.0 45.2 39.0  MCV 97.2 98.0  --  99.3  --   PLT 225 256  --  197  --    Basic Metabolic Panel: Recent Labs  Lab 05/18/19 0641 05/19/19 0348 05/20/19 0452  05/20/19 1416 05/21/19 0510 05/21/19 0542  NA 142 140 144   < > 145 147* 144  K 5.0 5.3* 6.0*   < > 5.3* 4.9 4.7  CL 103 103 105  --  108 107  --   CO2 '24 23 25  ' --  24 23  --   GLUCOSE 228* 235* 293*  --  246* 135*  --  BUN 97* 96* 103*  --  105* 107*  --   CREATININE 3.09* 2.75* 3.25*  --  3.20* 3.30*  --   CALCIUM 9.1 8.8* 9.1  --  8.7* 9.1  --   MG 1.7 2.5* 2.4  --   --   --   --   PHOS  --   --  7.0*  --   --   --   --    < > = values in this interval not displayed.   GFR: Estimated Creatinine Clearance: 25.9 mL/min (A) (by C-G formula based on SCr of 3.3 mg/dL (H)).  Liver Function  Tests: Recent Labs  Lab 05/20/19 0452 05/21/19 0510  AST 62* 46*  ALT 123* 105*  ALKPHOS 170* 149*  BILITOT 2.0* 1.6*  PROT 6.0* 5.9*  ALBUMIN 2.9* 2.9*    HbA1C: Hgb A1c MFr Bld  Date/Time Value Ref Range Status  03/12/2019 07:14 AM 6.7 (H) 4.8 - 5.6 % Final    Comment:    (NOTE) Pre diabetes:          5.7%-6.4% Diabetes:              >6.4% Glycemic control for   <7.0% adults with diabetes   09/08/2018 12:03 PM 6.3 (H) <5.7 % of total Hgb Final    Comment:    For someone without known diabetes, a hemoglobin  A1c value between 5.7% and 6.4% is consistent with prediabetes and should be confirmed with a  follow-up test. . For someone with known diabetes, a value <7% indicates that their diabetes is well controlled. A1c targets should be individualized based on duration of diabetes, age, comorbid conditions, and other considerations. . This assay result is consistent with an increased risk of diabetes. . Currently, no consensus exists regarding use of hemoglobin A1c for diagnosis of diabetes for children. .     CBG: Recent Labs  Lab 05/20/19 1730 05/20/19 2110 05/21/19 0822 05/21/19 1159 05/21/19 1547  GLUCAP 203* 171* 135* 128* 118*     Scheduled Meds: . apixaban  5 mg Per Tube BID  . budesonide (PULMICORT) nebulizer solution  0.25 mg Nebulization BID  . calcitRIOL  0.25 mcg Per Tube Daily  . chlorhexidine gluconate (MEDLINE KIT)  15 mL Mouth Rinse BID  . Chlorhexidine Gluconate Cloth  6 each Topical Daily  . dexamethasone (DECADRON) injection  6 mg Intravenous Daily  . [START ON 05/22/2019] diltiazem  60 mg Per Tube Q6H  . [START ON 05/22/2019] famotidine  20 mg Per Tube Daily  . feeding supplement (PRO-STAT SUGAR FREE 64)  30 mL Per Tube QID  . insulin aspart  0-20 Units Subcutaneous Q4H  . ipratropium-albuterol  3 mL Nebulization Q6H  . levETIRAcetam  500 mg Per Tube BID  . [START ON 05/22/2019] magnesium oxide  800 mg Per Tube Daily  . mouth  rinse  15 mL Mouth Rinse 10 times per day  . Melatonin  6 mg Per Tube QHS  . metoprolol tartrate  12.5 mg Per Tube BID  . sodium chloride flush  3 mL Intravenous Q12H  . sodium zirconium cyclosilicate  10 g Oral TID   Continuous Infusions: . sodium chloride 250 mL (05/20/19 1116)  . dexmedetomidine (PRECEDEX) IV infusion 1.1 mcg/kg/hr (05/21/19 1600)  . feeding supplement (VITAL 1.5 CAL) 1,000 mL (05/21/19 1604)  . furosemide Stopped (05/21/19 1534)  . phenylephrine (NEO-SYNEPHRINE) Adult infusion Stopped (05/21/19 1235)  . remdesivir 100 mg in NS 250 mL Stopped (05/21/19  6122)     LOS: 15 days   Cherene Altes, MD [facility] Office  [phone] Pager - Text Page per Amion  If 7PM-7AM, please contact night-coverage per Amion 05/21/2019, 4:33 PM

## 2019-05-21 NOTE — Progress Notes (Signed)
OT Cancellation Note  Patient Details Name: Dylan Goodman MRN: [id number] DOB: 07-13-1951   Cancelled Treatment:    Reason Eval/Treat Not Completed: Patient not medically ready. Pt sedated on vent. Will check on pt early next week for appropriateness for therapy.   Naeemah Jasmer,HILLARY 05/21/2019, 8:40 AM  Maurie Boettcher, OT/L   Acute OT Clinical Specialist Acute Rehabilitation Services Pager [phone] Office [phone]

## 2019-05-21 NOTE — Progress Notes (Signed)
Okay to use vas cath, per Dr Lake Bells, as triple lumen for now due to poor peripheral access.  If nephro wants to start CRRT, will place consult for PICC

## 2019-05-21 NOTE — Progress Notes (Signed)
Nutrition Follow-up  DOCUMENTATION CODES:   Not applicable  INTERVENTION:   Begin TF via OGT:   Vital 1.5 at 60 ml/h (1440 ml per day)   Pro-stat 30 ml QID   Provides 2560 kcal, 157 gm protein, 1100 ml free water daily  NUTRITION DIAGNOSIS:   Increased nutrient needs related to acute illness as evidenced by estimated needs.  Ongoing   GOAL:   Patient will meet greater than or equal to 90% of their needs  Being addressed with TF initiation today.  MONITOR:   Vent status, TF tolerance, Skin, Labs, I & O's  ASSESSMENT:   68 yo male admitted with seizure and brief cardiac arrest in the [facility] ED. Required intubation and was transferred to [facility]. 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  Received MD Consult for TF initiation and management. OG tube in place.   Patient remains intubated on ventilator support, on Precedex drip. MV: 12.6 L/min Temp (24hrs), Avg:98.8 F (37.1 C), Min:97.5 F (36.4 C), Max:99.9 F (37.7 C)   Labs reviewed. Potassium 4.7 WNL, BUN 107 (H), creatinine 3.3 (H) CBG's: 135-128  Medications reviewed and include calcitriol, decadron, novolog, mag-ox, lokelma, lasix precedex, neosynephrine.  Weight up 3.7 kg since yesterday. May require CRRT for volume removal. Awaiting Nephrology recommendations.   Diet Order:   Diet Order    None      EDUCATION NEEDS:   Not appropriate for education at this time  Skin:  Skin Assessment: Reviewed RN Assessment(MASD to perineum)  Last BM:  10/19 type 6  Height:   Ht Readings from Last 1 Encounters:  05/21/19 5\' 11"  (1.803 m)    Weight:   Wt Readings from Last 1 Encounters:  05/21/19 92.6 kg    Ideal Body Weight:  78.2 kg  BMI:  Body mass index is 28.47 kg/m.  Estimated Nutritional Needs:   Kcal:  2580  Protein:  140-160 gm  Fluid:  >/= 2.1 L    Molli Barrows, RD, LDN, [place] Pager [phone] After Hours Pager [phone]

## 2019-05-21 NOTE — Progress Notes (Signed)
Spoke with pts wife Mardene Celeste.  Updated on pt status.  Pt currently resting in bed comfortably.  Sedated with precedex.  Restrained for safety.  RT is attempting to wean ventilator support today.  Waiting to hear from nephrology regarding starting dialysis.  All questions answered.

## 2019-05-21 NOTE — Progress Notes (Signed)
Multiple attempts at new peripheral access on pt without any success.  2 attempts each made by myself and another RN.

## 2019-05-21 NOTE — Progress Notes (Signed)
Summary  Called back to see pt on 05/20/19.  He is a 68 year old WM with ICM- EF 40-45%, Afib, DM and also CKD (recent best crt baseline around 2).  He was hosp at [facility] from 9/14-9/19- discharged, then readmitted 10/6 to [facility]-  S/p cardiac arrest on 10/7.  He suffered A on CRF in that setting with crt peaking at 5.17 on 10/10, then improved slowly - renal service signed off - crt 2.75 yesterday but with BUN of 96.  Also on 10/20 was transferred to the ICU for hypoxia - currently on high flow O2-  There was no obvious hemodynamic comprimise but this AM creat was 3.25 with K of 6.0 (was 5.3 yest) and BUN of 103.  1725 of UOP recorded-  Had been on lasix- does not seem wet.  Unfortunately now on the verge of intubation     Objective Vital signs in last 24 hours: Vitals:   05/21/19 1430 05/21/19 1500 05/21/19 1515 05/21/19 1540  BP: 106/86 105/65    Pulse:    (!) 106  Resp: (!) 22 (!) 22 (!) 22   Temp:      TempSrc:      SpO2: 100% 100% 100%   Weight:      Height:       Weight change: 3.7 kg  Intake/Output Summary (Last 24 hours) at 05/21/2019 1546 Last data filed at 05/21/2019 1500 Gross per 24 hour  Intake 2543.85 ml  Output 740 ml  Net 1803.85 ml    Summary Pt is a 68 y.o. yo male with DM, ICM and baseline CKD- crt 2 with recent complicated course who was admitted on 05/06/2019 with cardiac arrest , A on CRF which improved but now A on CRF again in the setting of hypoxia, worsening resp status, transfer to ICU   Assessment/Plan:  Renal-  Baseline CKD- crt 2-  This is second event of A on CRF this hosp. Creat / Bun stable, no indication RRT yet.  D/W CCM, they want to try vol reduction to try and improve oxygenation. Will try lasix, if doesn't help CRRT may be needed.   Hyperkalemia-  Better w/ lokelma   Anemia- not a significant issue   HTN/volume- BP's wnl  COVID PNA - per CCM hopefully some of CT changes are due to vol excess, if not there will be not much hope for  recover  VDRF - per [place], MD 05/21/2019, 3:52 PM   Labs: Basic Metabolic Panel: Recent Labs  Lab 05/20/19 0452  05/20/19 1416 05/21/19 0510 05/21/19 0542  NA 144   < > 145 147* 144  K 6.0*   < > 5.3* 4.9 4.7  CL 105  --  108 107  --   CO2 25  --  24 23  --   GLUCOSE 293*  --  246* 135*  --   BUN 103*  --  105* 107*  --   CREATININE 3.25*  --  3.20* 3.30*  --   CALCIUM 9.1  --  8.7* 9.1  --   PHOS 7.0*  --   --   --   --    < > = values in this interval not displayed.   Liver Function Tests: Recent Labs  Lab 05/20/19 0452 05/21/19 0510  AST 62* 46*  ALT 123* 105*  ALKPHOS 170* 149*  BILITOT 2.0* 1.6*  PROT 6.0* 5.9*  ALBUMIN 2.9* 2.9*   No results for input(s): LIPASE,  AMYLASE in the last 168 hours. No results for input(s): AMMONIA in the last 168 hours. CBC: Recent Labs  Lab 05/15/19 0539 05/16/19 0723 05/20/19 1340 05/21/19 0510 05/21/19 0542  WBC 8.9 9.8  --  14.6*  --   NEUTROABS 7.8* 8.9*  --   --   --   HGB 12.1* 12.0* 13.3 13.7 13.3  HCT 38.7* 39.5 39.0 45.2 39.0  MCV 97.2 98.0  --  99.3  --   PLT 225 256  --  197  --    Cardiac Enzymes: No results for input(s): CKTOTAL, CKMB, CKMBINDEX, TROPONINI in the last 168 hours. CBG: Recent Labs  Lab 05/20/19 0803 05/20/19 1730 05/20/19 2110 05/21/19 0822 05/21/19 1159  GLUCAP 282* 203* 171* 135* 128*    Iron Studies:  Recent Labs    05/20/19 0452  FERRITIN 414*   Studies/Results: Dg Chest Port 1 View  Result Date: 05/20/2019 CLINICAL DATA:  Acute respiratory failure. COVID-19. EXAM: PORTABLE CHEST 1 VIEW 1:23 p.m. COMPARISON:  05/20/2019 at 9:40 a.m. and CT scan of the chest dated 05/18/2019 FINDINGS: Endotracheal tube tip is in good position 4 cm above the carina. Right-sided central line tip is in the superior vena cava just below the carina in good position. NG tube tip is below the diaphragm. The hazy bilateral peripheral infiltrates are improved since the study earlier  today. Small bilateral effusions as demonstrated on the prior CT scan. Heart size is normal. No significant bone abnormality. Aortic atherosclerosis. IMPRESSION: 1. ET tube, NG tube, and right central line are in good position. 2. Improved bilateral pulmonary infiltrates. 3. Small bilateral pleural effusions. 4. Aortic atherosclerosis. Electronically Signed   By: Lorriane Shire M.D.   On: 05/20/2019 13:50   Dg Chest Port 1 View  Result Date: 05/20/2019 CLINICAL DATA:  Acute respiratory failure with hypoxia, COVID-19 EXAM: PORTABLE CHEST 1 VIEW COMPARISON:  05/14/2019 FINDINGS: Patchy bilateral airspace disease again noted, left greater than right. Slight increase in the right lower lung since prior study. Cardiomegaly. Prior median sternotomy. Small bilateral effusions again noted, unchanged. IMPRESSION: Patchy bilateral airspace opacities, left greater than right. This is increased in the right lower lung since prior study. Stable small effusions. Electronically Signed   By: Rolm Baptise M.D.   On: 05/20/2019 10:20   Dg Abd Portable 1v  Result Date: 05/20/2019 CLINICAL DATA:  OG tube placement.  Acute respiratory failure. EXAM: PORTABLE ABDOMEN - 1 VIEW COMPARISON:  Abdominal radiograph dated 05/06/2019 FINDINGS: NG tube tip is in the body of the stomach. No dilated bowel. Small bilateral pleural effusions. IMPRESSION: NG tube tip in the body of the stomach. Electronically Signed   By: Lorriane Shire M.D.   On: 05/20/2019 13:51   Medications: Infusions: . sodium chloride 250 mL (05/20/19 1116)  . dexmedetomidine (PRECEDEX) IV infusion 1.1 mcg/kg/hr (05/21/19 1500)  . feeding supplement (VITAL 1.5 CAL)    . furosemide 60 mL/hr at 05/21/19 1500  . phenylephrine (NEO-SYNEPHRINE) Adult infusion Stopped (05/21/19 1235)  . remdesivir 100 mg in NS 250 mL Stopped (05/21/19 0958)    Scheduled Medications: . apixaban  5 mg Per Tube BID  . budesonide (PULMICORT) nebulizer solution  0.25 mg Nebulization  BID  . calcitRIOL  0.25 mcg Per Tube Daily  . chlorhexidine gluconate (MEDLINE KIT)  15 mL Mouth Rinse BID  . Chlorhexidine Gluconate Cloth  6 each Topical Daily  . dexamethasone (DECADRON) injection  6 mg Intravenous Daily  . [START ON 05/22/2019] diltiazem  60  mg Per Tube Q6H  . [START ON 05/22/2019] famotidine  20 mg Per Tube Daily  . feeding supplement (PRO-STAT SUGAR FREE 64)  30 mL Per Tube QID  . insulin aspart  0-20 Units Subcutaneous Q4H  . ipratropium-albuterol  3 mL Nebulization Q6H  . levETIRAcetam  500 mg Per Tube BID  . [START ON 05/22/2019] magnesium oxide  800 mg Per Tube Daily  . mouth rinse  15 mL Mouth Rinse 10 times per day  . Melatonin  6 mg Per Tube QHS  . metoprolol tartrate  12.5 mg Per Tube BID  . sodium chloride flush  3 mL Intravenous Q12H  . sodium zirconium cyclosilicate  10 g Oral TID    have reviewed scheduled and prn medications.  Physical Exam: General: chronically ill WM-  Looks older than 48- viewed thru window    Extremities: no visible edema     05/21/2019,3:46 PM  LOS: 15 days

## 2019-05-22 DIAGNOSIS — I4891 Unspecified atrial fibrillation: Secondary | ICD-10-CM | POA: Diagnosis not present

## 2019-05-22 DIAGNOSIS — N179 Acute kidney failure, unspecified: Secondary | ICD-10-CM | POA: Diagnosis not present

## 2019-05-22 DIAGNOSIS — J9601 Acute respiratory failure with hypoxia: Secondary | ICD-10-CM | POA: Diagnosis not present

## 2019-05-22 DIAGNOSIS — U071 COVID-19: Secondary | ICD-10-CM | POA: Diagnosis not present

## 2019-05-22 LAB — CBC
HCT: 37.2 % — ABNORMAL LOW (ref 39.0–52.0)
Hemoglobin: 11.2 g/dL — ABNORMAL LOW (ref 13.0–17.0)
MCH: 29.8 pg (ref 26.0–34.0)
MCHC: 30.1 g/dL (ref 30.0–36.0)
MCV: 98.9 fL (ref 80.0–100.0)
Platelets: 146 10*3/uL — ABNORMAL LOW (ref 150–400)
RBC: 3.76 MIL/uL — ABNORMAL LOW (ref 4.22–5.81)
RDW: 20.1 % — ABNORMAL HIGH (ref 11.5–15.5)
WBC: 13.7 10*3/uL — ABNORMAL HIGH (ref 4.0–10.5)
nRBC: 0 % (ref 0.0–0.2)

## 2019-05-22 LAB — COMPREHENSIVE METABOLIC PANEL
ALT: 104 U/L — ABNORMAL HIGH (ref 0–44)
AST: 74 U/L — ABNORMAL HIGH (ref 15–41)
Albumin: 2.4 g/dL — ABNORMAL LOW (ref 3.5–5.0)
Alkaline Phosphatase: 190 U/L — ABNORMAL HIGH (ref 38–126)
Anion gap: 14 (ref 5–15)
BUN: 139 mg/dL — ABNORMAL HIGH (ref 8–23)
CO2: 23 mmol/L (ref 22–32)
Calcium: 8.3 mg/dL — ABNORMAL LOW (ref 8.9–10.3)
Chloride: 111 mmol/L (ref 98–111)
Creatinine, Ser: 3.7 mg/dL — ABNORMAL HIGH (ref 0.61–1.24)
GFR calc Af Amer: 19 mL/min — ABNORMAL LOW (ref >60)
GFR calc non Af Amer: 16 mL/min — ABNORMAL LOW (ref >60)
Glucose, Bld: 363 mg/dL — ABNORMAL HIGH (ref 70–99)
Potassium: 3.9 mmol/L (ref 3.5–5.1)
Sodium: 148 mmol/L — ABNORMAL HIGH (ref 135–145)
Total Bilirubin: 1.3 mg/dL — ABNORMAL HIGH (ref 0.3–1.2)
Total Protein: 4.8 g/dL — ABNORMAL LOW (ref 6.5–8.1)

## 2019-05-22 LAB — GLUCOSE, CAPILLARY
Glucose-Capillary: 229 mg/dL — ABNORMAL HIGH (ref 70–99)
Glucose-Capillary: 314 mg/dL — ABNORMAL HIGH (ref 70–99)
Glucose-Capillary: 308 mg/dL — ABNORMAL HIGH (ref 70–99)
Glucose-Capillary: 250 mg/dL — ABNORMAL HIGH (ref 70–99)
Glucose-Capillary: 283 mg/dL — ABNORMAL HIGH (ref 70–99)
Glucose-Capillary: 264 mg/dL — ABNORMAL HIGH (ref 70–99)

## 2019-05-22 MED ORDER — INSULIN GLARGINE 100 UNIT/ML ~~LOC~~ SOLN
12.0000 [IU] | Freq: Every day | SUBCUTANEOUS | Status: DC
  Administered 2019-05-22: 22:00:00 12 [IU] via SUBCUTANEOUS
  Filled 2019-05-22 (×2): qty 0.12

## 2019-05-22 MED ORDER — POLYETHYLENE GLYCOL 3350 17 G PO PACK
17.0000 g | PACK | Freq: Every day | ORAL | Status: DC
  Administered 2019-05-22 – 2019-05-23 (×2): 17 g via ORAL
  Filled 2019-05-22 (×2): qty 1

## 2019-05-22 NOTE — Progress Notes (Signed)
NAME:  Dylan Goodman, MRN:  [id number], DOB:  07/15/1951, LOS: 65 ADMISSION DATE:  05-30-2019, CONSULTATION DATE:  05-30-23 REFERRING MD:  Reino Kent EDP, CHIEF COMPLAINT:  Cardiac arrest   Brief History   68 y/o male treated for COVID pneumonia on September 13 returned to the ED on 30-May-2023 with seizure and cardiac arrest.  Transitioned to floor where he had AKI which resolved but then later in his admission developed worsening respiratory failure and renal failure again.  Ultimately re-intubated on 10/21 and sent back to [facility].   Past Medical History  Systolic heart failure LVEF 40-45% Atrial fib GI bleed CAD Depression CKD DM2 Gastric ulcer GERD CABG Iron def anemia Skin cancer  Significant Hospital Events   9/14-9/19 admit to [facility] for COVID pneumonia.  10/05 to ED for Chest pain. Discharged home after negative cardiac. 10/06 To ED for seizure May 30, 2023 Coded in ED while awaiting placement 10/15 PCCM called back  10/17 Remains on 100%, 30L flow 10/20 placed back on remdesevir, when attempting to transfer to [facility] desaturated. Moved to ICU 10/21 renal fxn worse   Consults:  Neurology Cardiology Nephrology PCCM  Procedures:  ETT May 30, 2023 >> 10/8 ETT 10/21 >> R IJ HD Cath 10/21 >>  Significant Diagnostic Tests:  Renal US 9/14 >> WNL Lower extremity doppler 05-30-2023 >> CT head 10/6 >> neg CXR 10/15 >> ILD, possible some volume overload Echo 10/9 >> LVEF 45-50%, global normal RV systolic function, LA severely dilated, RA moderately dilated, mild MVR, mild TR, moderately elevated pulmonary artery systolic pressure 37/85 CT chest images personally reviewed: underlying emphysema, bilateral effusion, cardiomegally, bilateral ground glass and consolidation  Micro Data:  05/30/23 blood > neg 10/8 resp > opf 05/30/2023 urine > neg  Antimicrobials/COVID RX  10/20 cefepime > 10/22 10/20 remdesivir >   Interim history/subjective:   Remains mechanically ventilated Making urine  Weaning on  pressure support today  Objective   Blood pressure 116/67, pulse 93, temperature 98.1 F (36.7 C), resp. rate (!) 22, height 5\' 11"  (1.803 m), weight 93.1 kg, SpO2 100 %. CVP:  [2 mmHg-22 mmHg] 3 mmHg  Vent Mode: CPAP;PSV FiO2 (%):  [40 %] 40 % Set Rate:  [22 bmp] 22 bmp Vt Set:  [600 mL] 600 mL PEEP:  [8 cmH20] 8 cmH20 Pressure Support:  [10 cmH20] 10 cmH20 Plateau Pressure:  [20 cmH20-24 cmH20] 24 cmH20   Intake/Output Summary (Last 24 hours) at 05/22/2019 1356 Last data filed at 05/22/2019 8850 Gross per 24 hour  Intake 1938.2 ml  Output 3025 ml  Net -1086.8 ml   Filed Weights   05/20/19 0314 05/21/19 0500 05/22/19 0500  Weight: 88.9 kg 92.6 kg 93.1 kg    Examination:  General:  In bed on vent HENT: NCAT ETT in place PULM: Crackles bases B, vent supported breathing CV: RRR, no mgr GI: BS+, soft, nontender MSK: normal bulk and tone Neuro: sedated on vent  10/21 CXR > ETT in place, NG in place, CVL in good position, small bilateral effusions  Resolved Hospital Problem list     Assessment & Plan:  AKI: appears dry/euvolemic on physical exam but lungs look wet on CT chest from 10/19 (pleural effusions, pulmonary hypertension, interlobular septal thickening) Continue lasix Appreciate renal input  Acute respiratory failure with hypoxemia: likely post inflammatory changes as well as some degree of volume overload COVID 19 : late phase ARDS? Stop systemic steroids Pressure support weaning as able today VAP prevention Daily WUA/SBT Monitor off antibiotics Continue  lasix  Need for sedation for mechanical ventilation RASS goal -1 to -2 precedex per PAD protocol Prn fentanyl  Underlying emphysema Scheduled duoneb  Seizure: on admission, related to cardiac arrest/hypoxemia, none since Continue keppra 500mg  bid  Systolic heart failure: lungs appear wet to me on chest imaging Continue diuresis as above Continue metoprolol  Atrial fib eliquis Tele  Best  practice:  Diet: tube feeding Pain/Anxiety/Delirium protocol (if indicated): yes, RASS target -1 to -2 VAP protocol (if indicated): yes DVT prophylaxis: eliquis GI prophylaxis: famotidine, renal dosing Glucose control: per TRH Mobility: bed rest Code Status: full Family Communication: I updated his wife Mardene Celeste today Disposition: remain in ICU  Labs   CBC: Recent Labs  Lab 05/16/19 0723 05/20/19 1340 05/21/19 0510 05/21/19 0542 05/22/19 0400  WBC 9.8  --  14.6*  --  13.7*  NEUTROABS 8.9*  --   --   --   --   HGB 12.0* 13.3 13.7 13.3 11.2*  HCT 39.5 39.0 45.2 39.0 37.2*  MCV 98.0  --  99.3  --  98.9  PLT 256  --  197  --  146*    Basic Metabolic Panel: Recent Labs  Lab 05/18/19 0641 05/19/19 0348 05/20/19 0452 05/20/19 1340 05/20/19 1416 05/21/19 0510 05/21/19 0542 05/21/19 1615 05/21/19 1720 05/22/19 0400  NA 142 140 144 142 145 147* 144  --   --  148*  K 5.0 5.3* 6.0* 5.2* 5.3* 4.9 4.7  --   --  3.9  CL 103 103 105  --  108 107  --   --   --  111  CO2 24 23 25   --  24 23  --   --   --  23  GLUCOSE 228* 235* 293*  --  246* 135*  --   --   --  363*  BUN 97* 96* 103*  --  105* 107*  --   --   --  139*  CREATININE 3.09* 2.75* 3.25*  --  3.20* 3.30*  --   --   --  3.70*  CALCIUM 9.1 8.8* 9.1  --  8.7* 9.1  --   --   --  8.3*  MG 1.7 2.5* 2.4  --   --   --   --  2.3 2.1  --   PHOS  --   --  7.0*  --   --   --   --  4.9* 5.2*  --    GFR: Estimated Creatinine Clearance: 23.2 mL/min (A) (by C-G formula based on SCr of 3.7 mg/dL (H)). Recent Labs  Lab 05/16/19 0723 05/19/19 0348 05/20/19 0452 05/21/19 0510 05/22/19 0400  PROCALCITON  --  0.32 0.34  --   --   WBC 9.8  --   --  14.6* 13.7*    Liver Function Tests: Recent Labs  Lab 05/20/19 0452 05/21/19 0510 05/22/19 0400  AST 62* 46* 74*  ALT 123* 105* 104*  ALKPHOS 170* 149* 190*  BILITOT 2.0* 1.6* 1.3*  PROT 6.0* 5.9* 4.8*  ALBUMIN 2.9* 2.9* 2.4*   No results for input(s): LIPASE, AMYLASE in the  last 168 hours. No results for input(s): AMMONIA in the last 168 hours.  ABG    Component Value Date/Time   PHART 7.559 (H) 05/21/2019 0542   PCO2ART 24.0 (L) 05/21/2019 0542   PO2ART 143.0 (H) 05/21/2019 0542   HCO3 21.5 05/21/2019 0542   TCO2 22 05/21/2019 0542   ACIDBASEDEF 2.9 (H) 05/14/2019 5176  O2SAT 100.0 05/21/2019 0542     Coagulation Profile: No results for input(s): INR, PROTIME in the last 168 hours.  Cardiac Enzymes: No results for input(s): CKTOTAL, CKMB, CKMBINDEX, TROPONINI in the last 168 hours.  HbA1C: Hgb A1c MFr Bld  Date/Time Value Ref Range Status  03/12/2019 07:14 AM 6.7 (H) 4.8 - 5.6 % Final    Comment:    (NOTE) Pre diabetes:          5.7%-6.4% Diabetes:              >6.4% Glycemic control for   <7.0% adults with diabetes   09/08/2018 12:03 PM 6.3 (H) <5.7 % of total Hgb Final    Comment:    For someone without known diabetes, a hemoglobin  A1c value between 5.7% and 6.4% is consistent with prediabetes and should be confirmed with a  follow-up test. . For someone with known diabetes, a value <7% indicates that their diabetes is well controlled. A1c targets should be individualized based on duration of diabetes, age, comorbid conditions, and other considerations. . This assay result is consistent with an increased risk of diabetes. . Currently, no consensus exists regarding use of hemoglobin A1c for diagnosis of diabetes for children. .     CBG: Recent Labs  Lab 05/21/19 1951 05/21/19 2340 05/22/19 0341 05/22/19 0846 05/22/19 1315  GLUCAP 229* 294* 314* 308* 250*       Critical care time: 35 minutes     Roselie Awkward, MD [place] [facility] Pager: [phone] Cell: [phone] If no response, call [phone]

## 2019-05-22 NOTE — Progress Notes (Signed)
Inpatient Diabetes Program Recommendations  AACE/ADA: New Consensus Statement on Inpatient Glycemic Control (2015)  Target Ranges:  Prepandial:   less than 140 mg/dL      Peak postprandial:   less than 180 mg/dL (1-2 hours)      Critically ill patients:  140 - 180 mg/dL   Lab Results  Component Value Date   GLUCAP 308 (H) 05/22/2019   HGBA1C 6.7 (H) 03/12/2019    Review of Glycemic Control Results for Dylan Goodman, Dylan Goodman (MRN [id number]) as of 05/22/2019 10:25  Ref. Range 05/21/2019 23:40 05/22/2019 03:41 05/22/2019 08:46  Glucose-Capillary Latest Ref Range: 70 - 99 mg/dL 294 (H) 314 (H) 308 (H)   Diabetes history: Type 2 DM Outpatient Diabetes medications: Tresiba 12 units QD Current orders for Inpatient glycemic control: Novolog 0-20 units Q4H   Inpatient Diabetes Program Recommendations:    Consider adding: -Lantus 8 units BID - Novolog 3 units Q4H for tube feed coverage (to be stopped or held in the event tube feeds are stopped).  Thanks, Bronson Curb, MSN, RNC-OB Diabetes Coordinator [phone] (8a-5p)

## 2019-05-22 NOTE — Progress Notes (Signed)
Dylan Goodman  OEU:[id number] DOB: [birth year] DOA: 2019/05/31 PCP: Steele Sizer, MD    Brief Narrative:  68 year old with a history of atrial fibrillation, GIB, CAD status post CABG, nasopharyngeal cancer, CKD, DM, and GERD who was previously treated for Covid pneumonia 9/17 and returned to the [facility] ED with reported seizure-like behavior, and subsequently suffered a cardiac arrest.  He was intubated and transferred to [facility] ICU.  Significant Events: 9/14 > 9/19 admitted to the [facility] for Covid pneumonia 10/5 ED evaluation with chest pain -discharged home 10/6 ED evaluation for seizure -CT head negative for acute findings 05/31/23 coded while in ED awaiting placement -intubated 10/8 extubated -transferred out of ICU 10/15 hypoxia requiring PCCM consultation 10/19 high resolution CT -cardiomegaly, small pleural effusions, extensive patchy consolidation from last opacities -moderate to severe emphysema 10/21 intubation -vasc cath placement -transfer to [facility]  COVID-19 specific Treatment: Remdesivir 10/20 >  Subjective: No evident acute distress at time of rounding.  Assessment & Plan:  Acute hypoxic respiratory failure unclear etiology Ventilator management per PCCM - possible fibrosis versus edema  Covid pneumonia September 2020 Now on a second course of remdesivir -stopping Decadron given impressive uremia -not clinically convinced this represents recurrent or continued viral pneumonia  Seizure At presentation -questionably related to cardiac arrest -on Keppra for now -no evidence of recurrence  Chronic atrial fibrillation Rate reasonably controlled - chronically on Eliquis  Mild systolic CHF TTE 15/4 noted EF 45-50%  DM 2 CBG climbing -adjust treatment and follow trend  Acute kidney injury on CKD Creatinine peaked at 5.17 10/10 -baseline creatinine 2 -nephrology following -has not yet required CRRT -increased urine output noted in Foley bag -continue to  follow creatinine trend  Recent Labs  Lab 05/19/19 0348 05/20/19 0452 05/20/19 1416 05/21/19 0510 05/22/19 0400  CREATININE 2.75* 3.25* 3.20* 3.30* 3.70*    Hyperkalemia Resolved with Lokelma dosing  Mild transaminitis Possible mild shock liver versus effect of remdesivir versus right heart failure/congestion - follow trend  Deconditioning  DVT prophylaxis: Eliquis Code Status: FULL CODE Family Communication: Per PCCM Disposition Plan: ICU  Consultants:  Neurology Cardiology Nephrology PCCM  Antimicrobials:  Zosyn 31-May-2023 > 10/11  Objective: Blood pressure 109/63, pulse 92, temperature 99.7 F (37.6 C), resp. rate (!) 29, height 5' 11" (1.803 m), weight 93.1 kg, SpO2 100 %.  Intake/Output Summary (Last 24 hours) at 05/22/2019 0854 Last data filed at 05/22/2019 0700 Gross per 24 hour  Intake 2724.95 ml  Output 2065 ml  Net 659.95 ml   Filed Weights   05/20/19 0314 05/21/19 0500 05/22/19 0500  Weight: 88.9 kg 92.6 kg 93.1 kg    Examination: General: Stable on ventilator -no apparent distress Lungs: Fine crackles scattered throughout without change Cardiovascular: Irregularly irregular with rate controlled  Abdomen: Overweight, soft, bowel sounds present, no rebound Extremities: No edema bilateral lower extremities  CBC: Recent Labs  Lab 05/16/19 0723  05/21/19 0510 05/21/19 0542 05/22/19 0400  WBC 9.8  --  14.6*  --  13.7*  NEUTROABS 8.9*  --   --   --   --   HGB 12.0*   < > 13.7 13.3 11.2*  HCT 39.5   < > 45.2 39.0 37.2*  MCV 98.0  --  99.3  --  98.9  PLT 256  --  197  --  146*   < > = values in this interval not displayed.   Basic Metabolic Panel: Recent Labs  Lab 05/20/19 0452  05/20/19 1416 05/21/19  0510 05/21/19 0542 05/21/19 1615 05/21/19 1720 05/22/19 0400  NA 144   < > 145 147* 144  --   --  148*  K 6.0*   < > 5.3* 4.9 4.7  --   --  3.9  CL 105  --  108 107  --   --   --  111  CO2 25  --  24 23  --   --   --  23  GLUCOSE 293*   --  246* 135*  --   --   --  363*  BUN 103*  --  105* 107*  --   --   --  139*  CREATININE 3.25*  --  3.20* 3.30*  --   --   --  3.70*  CALCIUM 9.1  --  8.7* 9.1  --   --   --  8.3*  MG 2.4  --   --   --   --  2.3 2.1  --   PHOS 7.0*  --   --   --   --  4.9* 5.2*  --    < > = values in this interval not displayed.   GFR: Estimated Creatinine Clearance: 23.2 mL/min (A) (by C-G formula based on SCr of 3.7 mg/dL (H)).  Liver Function Tests: Recent Labs  Lab 05/20/19 0452 05/21/19 0510 05/22/19 0400  AST 62* 46* 74*  ALT 123* 105* 104*  ALKPHOS 170* 149* 190*  BILITOT 2.0* 1.6* 1.3*  PROT 6.0* 5.9* 4.8*  ALBUMIN 2.9* 2.9* 2.4*    HbA1C: Hgb A1c MFr Bld  Date/Time Value Ref Range Status  03/12/2019 07:14 AM 6.7 (H) 4.8 - 5.6 % Final    Comment:    (NOTE) Pre diabetes:          5.7%-6.4% Diabetes:              >6.4% Glycemic control for   <7.0% adults with diabetes   09/08/2018 12:03 PM 6.3 (H) <5.7 % of total Hgb Final    Comment:    For someone without known diabetes, a hemoglobin  A1c value between 5.7% and 6.4% is consistent with prediabetes and should be confirmed with a  follow-up test. . For someone with known diabetes, a value <7% indicates that their diabetes is well controlled. A1c targets should be individualized based on duration of diabetes, age, comorbid conditions, and other considerations. . This assay result is consistent with an increased risk of diabetes. . Currently, no consensus exists regarding use of hemoglobin A1c for diagnosis of diabetes for children. .     CBG: Recent Labs  Lab 05/21/19 1159 05/21/19 1547 05/21/19 1951 05/21/19 2340 05/22/19 0341  GLUCAP 128* 118* 229* 294* 314*     Scheduled Meds: . apixaban  5 mg Per Tube BID  . budesonide (PULMICORT) nebulizer solution  0.25 mg Nebulization BID  . calcitRIOL  0.25 mcg Per Tube Daily  . chlorhexidine gluconate (MEDLINE KIT)  15 mL Mouth Rinse BID  . Chlorhexidine  Gluconate Cloth  6 each Topical Daily  . dexamethasone (DECADRON) injection  6 mg Intravenous Daily  . diltiazem  60 mg Per Tube Q6H  . famotidine  20 mg Per Tube Daily  . feeding supplement (PRO-STAT SUGAR FREE 64)  30 mL Per Tube QID  . insulin aspart  0-20 Units Subcutaneous Q4H  . levETIRAcetam  500 mg Per Tube BID  . mouth rinse  15 mL Mouth Rinse 10 times per day  .  Melatonin  6 mg Per Tube QHS  . metoprolol tartrate  12.5 mg Per Tube BID  . sodium chloride flush  3 mL Intravenous Q12H  . sodium zirconium cyclosilicate  10 g Oral TID     LOS: 16 days   Cherene Altes, MD [facility] Office  [phone] Pager - Text Page per Shea Evans  If 7PM-7AM, please contact night-coverage per Amion 05/22/2019, 8:54 AM

## 2019-05-22 NOTE — Progress Notes (Addendum)
Summary  Called back to see pt on 05/20/19.  He is a 68 year old WM with ICM- EF 40-45%, Afib, DM and also CKD (recent best crt baseline around 2).  He was hosp at [facility] from 9/14-9/19- discharged, then readmitted 10/6 to [facility]-  S/p cardiac arrest on 10/7.  He suffered A on CRF in that setting with crt peaking at 5.17 on 10/10, then improved slowly - renal service signed off - crt 2.75 yesterday but with BUN of 96.  Also on 10/20 was transferred to the ICU for hypoxia - currently on high flow O2-  There was no obvious hemodynamic comprimise but this AM creat was 3.25 with K of 6.0 (was 5.3 yest) and BUN of 103.  1725 of UOP recorded-  Had been on lasix- does not seem wet.  Unfortunately now on the verge of intubation     Objective Vital signs in last 24 hours: Vitals:   05/22/19 1300 05/22/19 1400 05/22/19 1500 05/22/19 1519  BP: 116/67 110/74 103/66 114/67  Pulse:    (!) 113  Resp: (!) 22 (!) 24 (!) 23 (!) 23  Temp:      TempSrc:      SpO2: 100% 100% 100% 100%  Weight:      Height:       Weight change: 0.5 kg  Intake/Output Summary (Last 24 hours) at 05/22/2019 1637 Last data filed at 05/22/2019 1600 Gross per 24 hour  Intake 2168 ml  Output 3300 ml  Net -1132 ml    Summary Pt is a 68 y.o. yo male with DM, ICM and baseline CKD- crt 2 with recent complicated course who was admitted on 05/06/2019 with cardiac arrest , A on CRF which improved but now A on CRF again in the setting of hypoxia, worsening resp status, transfer to ICU   Exam:  pt on vent, responds to voice   No jvd, NG in for tube feeds  Chest cta bilat  Cor reg no RG   Abd obese, soft nontender ? Ascites   Ext mild pretib edema bilat, no hip edema   Neuro- responsive    Assessment/Plan:  Renal-  Baseline CKD- crt 2-  This is second event of A on CRF this hosp. Creat / Bun stable, no indication RRT yet.  Per CCM feel by CT patient may be intravasc wet, IV lasix started 10/22.  2.0L UOP yest and 1.7 so far  today. Diuresing ok. BUN up 139 , creat ^ 3.70. CVP 0-5.  Will cont IV lasix for now and d/w further w/ CCM as to whether helping or not.    SP cardiac arrest - on 10/07  Atrial fib w/ RVR - on po metoprolol bid 12.5 for rate control  Hypotension - on pressors  COVID PNA/ underlying COPD - per CCM hopefully some of CT changes are due to vol excess, if not there will be not much hope for recovering  Hyperkalemia-  Better  Hypernatremia - worse w/ diuresis  Anemia- not a significant issue   HTN- BP's on the softer side today, on pressors watch closely  VDRF - per CCM  CAD h/o CABG  DM2  Kelly Splinter, MD 05/22/2019, 4:37 PM   Labs: Basic Metabolic Panel: Recent Labs  Lab 05/20/19 0452  05/20/19 1416 05/21/19 0510 05/21/19 0542 05/21/19 1615 05/21/19 1720 05/22/19 0400  NA 144   < > 145 147* 144  --   --  148*  K 6.0*   < >  5.3* 4.9 4.7  --   --  3.9  CL 105  --  108 107  --   --   --  111  CO2 25  --  24 23  --   --   --  23  GLUCOSE 293*  --  246* 135*  --   --   --  363*  BUN 103*  --  105* 107*  --   --   --  139*  CREATININE 3.25*  --  3.20* 3.30*  --   --   --  3.70*  CALCIUM 9.1  --  8.7* 9.1  --   --   --  8.3*  PHOS 7.0*  --   --   --   --  4.9* 5.2*  --    < > = values in this interval not displayed.   Liver Function Tests: Recent Labs  Lab 05/20/19 0452 05/21/19 0510 05/22/19 0400  AST 62* 46* 74*  ALT 123* 105* 104*  ALKPHOS 170* 149* 190*  BILITOT 2.0* 1.6* 1.3*  PROT 6.0* 5.9* 4.8*  ALBUMIN 2.9* 2.9* 2.4*   No results for input(s): LIPASE, AMYLASE in the last 168 hours. No results for input(s): AMMONIA in the last 168 hours. CBC: Recent Labs  Lab 05/16/19 0723  05/21/19 0510 05/21/19 0542 05/22/19 0400  WBC 9.8  --  14.6*  --  13.7*  NEUTROABS 8.9*  --   --   --   --   HGB 12.0*   < > 13.7 13.3 11.2*  HCT 39.5   < > 45.2 39.0 37.2*  MCV 98.0  --  99.3  --  98.9  PLT 256  --  197  --  146*   < > = values in this interval not  displayed.   Cardiac Enzymes: No results for input(s): CKTOTAL, CKMB, CKMBINDEX, TROPONINI in the last 168 hours. CBG: Recent Labs  Lab 05/21/19 1951 05/21/19 2340 05/22/19 0341 05/22/19 0846 05/22/19 1315  GLUCAP 229* 294* 314* 308* 250*    Iron Studies:  Recent Labs    05/20/19 0452  FERRITIN 414*   Studies/Results: No results found. Medications: Infusions: . sodium chloride 250 mL (05/21/19 1732)  . dexmedetomidine (PRECEDEX) IV infusion 0.5 mcg/kg/hr (05/22/19 1227)  . feeding supplement (VITAL 1.5 CAL) 1,000 mL (05/22/19 1225)  . furosemide 100 mg (05/22/19 1513)  . phenylephrine (NEO-SYNEPHRINE) Adult infusion 15 mcg/min (05/22/19 1236)  . remdesivir 100 mg in NS 250 mL 100 mg (05/22/19 1124)    Scheduled Medications: . apixaban  5 mg Per Tube BID  . budesonide (PULMICORT) nebulizer solution  0.25 mg Nebulization BID  . calcitRIOL  0.25 mcg Per Tube Daily  . chlorhexidine gluconate (MEDLINE KIT)  15 mL Mouth Rinse BID  . Chlorhexidine Gluconate Cloth  6 each Topical Daily  . famotidine  20 mg Per Tube Daily  . feeding supplement (PRO-STAT SUGAR FREE 64)  30 mL Per Tube QID  . insulin aspart  0-20 Units Subcutaneous Q4H  . levETIRAcetam  500 mg Per Tube BID  . mouth rinse  15 mL Mouth Rinse 10 times per day  . Melatonin  6 mg Per Tube QHS  . metoprolol tartrate  12.5 mg Per Tube BID  . polyethylene glycol  17 g Oral Daily  . sodium chloride flush  3 mL Intravenous Q12H    have reviewed scheduled and prn medications.      05/22/2019,4:37 PM  LOS: 16 days

## 2019-05-23 ENCOUNTER — Inpatient Hospital Stay (HOSPITAL_COMMUNITY): Payer: Medicare Other

## 2019-05-23 DIAGNOSIS — I4891 Unspecified atrial fibrillation: Secondary | ICD-10-CM | POA: Diagnosis not present

## 2019-05-23 DIAGNOSIS — J9601 Acute respiratory failure with hypoxia: Secondary | ICD-10-CM | POA: Diagnosis not present

## 2019-05-23 DIAGNOSIS — J069 Acute upper respiratory infection, unspecified: Secondary | ICD-10-CM | POA: Diagnosis not present

## 2019-05-23 DIAGNOSIS — U071 COVID-19: Secondary | ICD-10-CM | POA: Diagnosis not present

## 2019-05-23 LAB — CBC
HCT: 39.3 % (ref 39.0–52.0)
Hemoglobin: 12 g/dL — ABNORMAL LOW (ref 13.0–17.0)
MCH: 30.8 pg (ref 26.0–34.0)
MCHC: 30.5 g/dL (ref 30.0–36.0)
MCV: 101 fL — ABNORMAL HIGH (ref 80.0–100.0)
Platelets: 112 10*3/uL — ABNORMAL LOW (ref 150–400)
RBC: 3.89 MIL/uL — ABNORMAL LOW (ref 4.22–5.81)
RDW: 19.9 % — ABNORMAL HIGH (ref 11.5–15.5)
WBC: 14.1 10*3/uL — ABNORMAL HIGH (ref 4.0–10.5)
nRBC: 0 % (ref 0.0–0.2)

## 2019-05-23 LAB — GLUCOSE, CAPILLARY
Glucose-Capillary: 152 mg/dL — ABNORMAL HIGH (ref 70–99)
Glucose-Capillary: 161 mg/dL — ABNORMAL HIGH (ref 70–99)
Glucose-Capillary: 180 mg/dL — ABNORMAL HIGH (ref 70–99)
Glucose-Capillary: 187 mg/dL — ABNORMAL HIGH (ref 70–99)
Glucose-Capillary: 254 mg/dL — ABNORMAL HIGH (ref 70–99)
Glucose-Capillary: 268 mg/dL — ABNORMAL HIGH (ref 70–99)
Glucose-Capillary: 287 mg/dL — ABNORMAL HIGH (ref 70–99)

## 2019-05-23 LAB — POCT I-STAT 7, (LYTES, BLD GAS, ICA,H+H)
Acid-Base Excess: 2 mmol/L (ref 0.0–2.0)
Bicarbonate: 25.7 mmol/L (ref 20.0–28.0)
Calcium, Ion: 1.17 mmol/L (ref 1.15–1.40)
HCT: 37 % — ABNORMAL LOW (ref 39.0–52.0)
Hemoglobin: 12.6 g/dL — ABNORMAL LOW (ref 13.0–17.0)
O2 Saturation: 99 %
Patient temperature: 98.2
Potassium: 3.5 mmol/L (ref 3.5–5.1)
Sodium: 150 mmol/L — ABNORMAL HIGH (ref 135–145)
TCO2: 27 mmol/L (ref 22–32)
pCO2 arterial: 34.9 mmHg (ref 32.0–48.0)
pH, Arterial: 7.473 — ABNORMAL HIGH (ref 7.350–7.450)
pO2, Arterial: 106 mmHg (ref 83.0–108.0)

## 2019-05-23 LAB — COMPREHENSIVE METABOLIC PANEL
ALT: 122 U/L — ABNORMAL HIGH (ref 0–44)
AST: 75 U/L — ABNORMAL HIGH (ref 15–41)
Albumin: 2.2 g/dL — ABNORMAL LOW (ref 3.5–5.0)
Alkaline Phosphatase: 202 U/L — ABNORMAL HIGH (ref 38–126)
Anion gap: 15 (ref 5–15)
BUN: 169 mg/dL — ABNORMAL HIGH (ref 8–23)
CO2: 26 mmol/L (ref 22–32)
Calcium: 8.4 mg/dL — ABNORMAL LOW (ref 8.9–10.3)
Chloride: 110 mmol/L (ref 98–111)
Creatinine, Ser: 4.07 mg/dL — ABNORMAL HIGH (ref 0.61–1.24)
GFR calc Af Amer: 17 mL/min — ABNORMAL LOW (ref >60)
GFR calc non Af Amer: 14 mL/min — ABNORMAL LOW (ref >60)
Glucose, Bld: 287 mg/dL — ABNORMAL HIGH (ref 70–99)
Potassium: 3.4 mmol/L — ABNORMAL LOW (ref 3.5–5.1)
Sodium: 151 mmol/L — ABNORMAL HIGH (ref 135–145)
Total Bilirubin: 1.1 mg/dL (ref 0.3–1.2)
Total Protein: 4.9 g/dL — ABNORMAL LOW (ref 6.5–8.1)

## 2019-05-23 LAB — BASIC METABOLIC PANEL
Anion gap: 13 (ref 5–15)
BUN: 130 mg/dL — ABNORMAL HIGH (ref 8–23)
CO2: 28 mmol/L (ref 22–32)
Calcium: 8.1 mg/dL — ABNORMAL LOW (ref 8.9–10.3)
Chloride: 113 mmol/L — ABNORMAL HIGH (ref 98–111)
Creatinine, Ser: 3.71 mg/dL — ABNORMAL HIGH (ref 0.61–1.24)
GFR calc Af Amer: 19 mL/min — ABNORMAL LOW (ref >60)
GFR calc non Af Amer: 16 mL/min — ABNORMAL LOW (ref >60)
Glucose, Bld: 176 mg/dL — ABNORMAL HIGH (ref 70–99)
Potassium: 3.3 mmol/L — ABNORMAL LOW (ref 3.5–5.1)
Sodium: 154 mmol/L — ABNORMAL HIGH (ref 135–145)

## 2019-05-23 LAB — HEPARIN LEVEL (UNFRACTIONATED): Heparin Unfractionated: >2.2 IU/mL — ABNORMAL HIGH (ref 0.30–0.70)

## 2019-05-23 LAB — APTT: aPTT: 33 seconds (ref 24–36)

## 2019-05-23 IMAGING — DX DG CHEST 1V PORT
1 series · 1 of 1 positions shown · non-contrast
Comparison: [DATE]

CLINICAL DATA: pneunmonia due to [V7].

EXAM:
PORTABLE CHEST 1 VIEW

[chest ap]
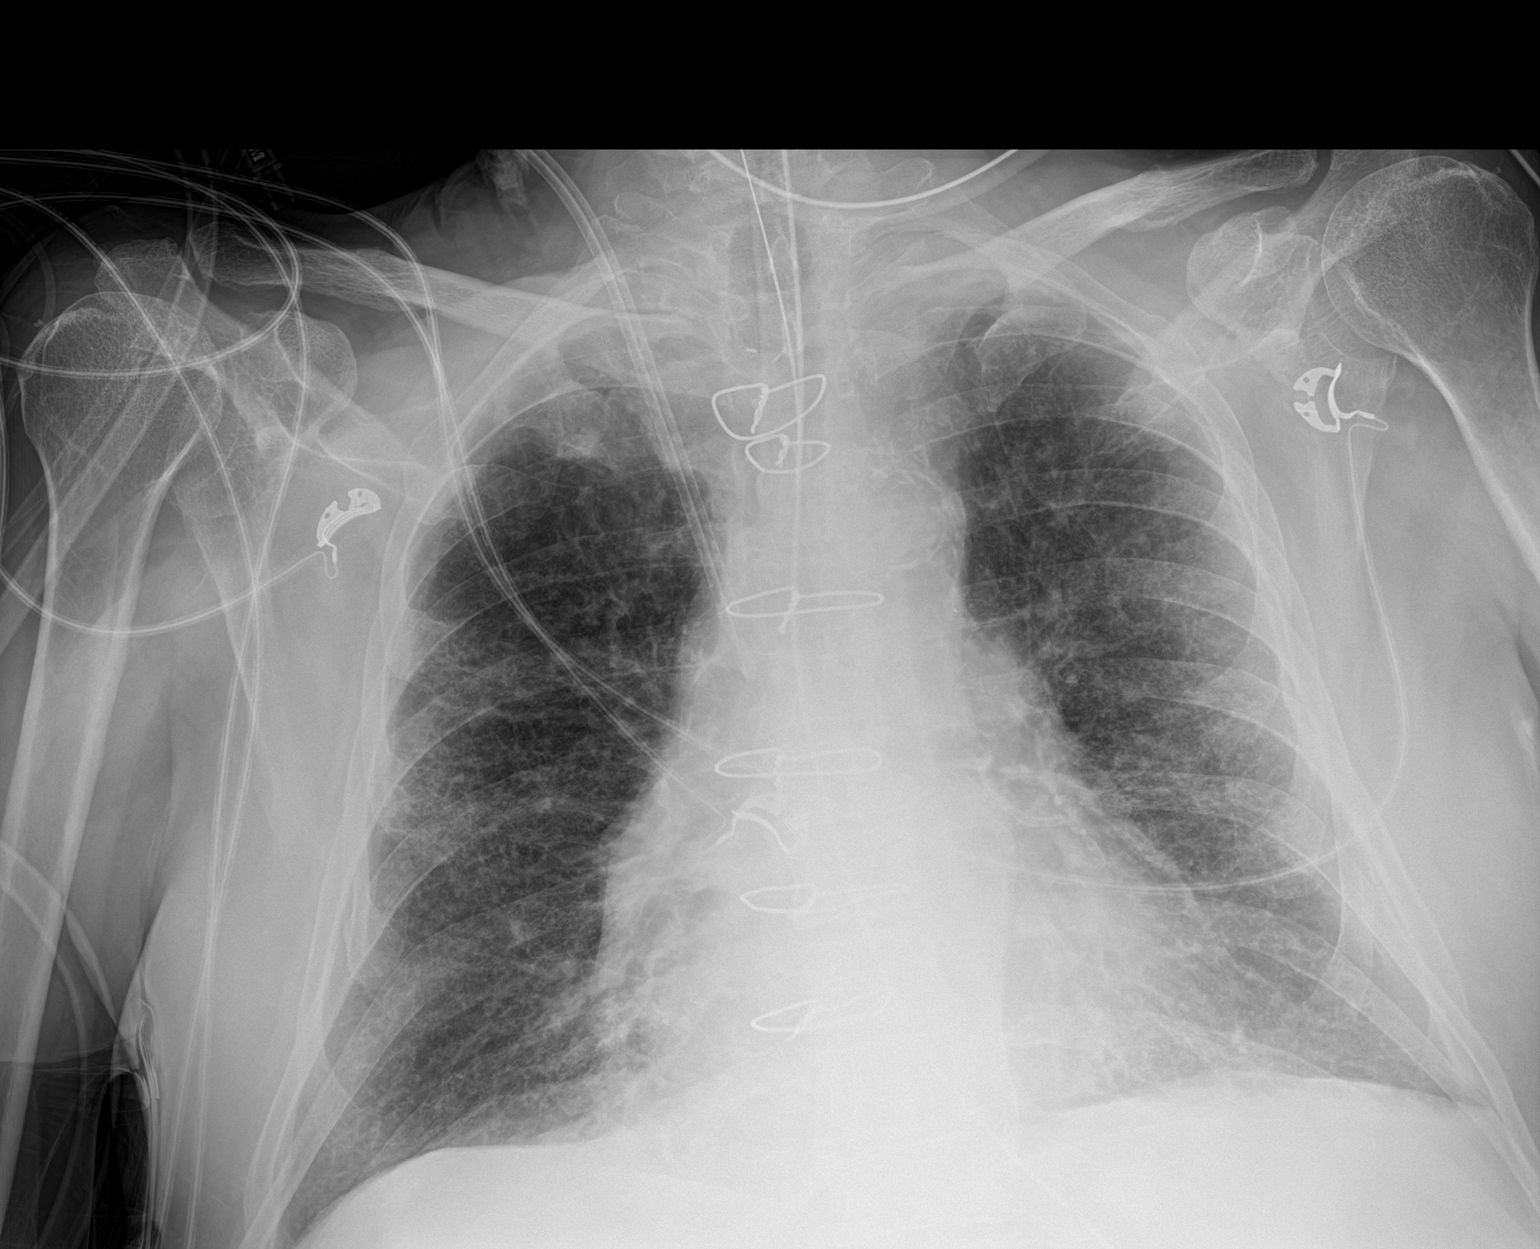

[1 of 1 positions shown; findings below may reference images not displayed]

FINDINGS: Stable position of ET tube, enteric tube, and right IJ catheter.
Previous median sternotomy and CABG. Normal heart size. Interval
improvement in bilateral interstitial and patchy airspace opacities.
No pleural effusion identified.
IMPRESSION: 1. Interval improvement in bilateral interstitial and airspace
opacities compatible with resolving pneumonia.
2. Stable support apparatus.

## 2019-05-23 MED ORDER — PRISMASOL BGK 4/2.5 32-4-2.5 MEQ/L REPLACEMENT SOLN
Status: DC
  Administered 2019-05-24 – 2019-05-28 (×7): via INTRAVENOUS_CENTRAL

## 2019-05-23 MED ORDER — PRISMASOL BGK 4/2.5 32-4-2.5 MEQ/L REPLACEMENT SOLN
Status: DC
  Administered 2019-05-24 – 2019-05-28 (×8): via INTRAVENOUS_CENTRAL

## 2019-05-23 MED ORDER — MOMETASONE FURO-FORMOTEROL FUM 200-5 MCG/ACT IN AERO
2.0000 | INHALATION_SPRAY | Freq: Two times a day (BID) | RESPIRATORY_TRACT | Status: DC
  Administered 2019-05-23 – 2019-06-05 (×25): 2 via RESPIRATORY_TRACT
  Filled 2019-05-23 (×2): qty 8.8

## 2019-05-23 MED ORDER — METOPROLOL TARTRATE 25 MG PO TABS
25.0000 mg | ORAL_TABLET | Freq: Two times a day (BID) | ORAL | Status: DC
  Administered 2019-05-23: 25 mg
  Filled 2019-05-23: qty 1

## 2019-05-23 MED ORDER — METOPROLOL TARTRATE 5 MG/5ML IV SOLN
10.0000 mg | Freq: Once | INTRAVENOUS | Status: AC
  Administered 2019-05-23: 10 mg via INTRAVENOUS
  Filled 2019-05-23: qty 10

## 2019-05-23 MED ORDER — SENNA 8.6 MG PO TABS
1.0000 | ORAL_TABLET | Freq: Two times a day (BID) | ORAL | Status: DC
  Filled 2019-05-23: qty 1

## 2019-05-23 MED ORDER — POLYETHYLENE GLYCOL 3350 17 G PO PACK
17.0000 g | PACK | Freq: Two times a day (BID) | ORAL | Status: DC
  Filled 2019-05-23: qty 1

## 2019-05-23 MED ORDER — DILTIAZEM HCL 30 MG PO TABS
30.0000 mg | ORAL_TABLET | Freq: Four times a day (QID) | ORAL | Status: DC
  Administered 2019-05-23 – 2019-06-04 (×41): 30 mg via ORAL
  Filled 2019-05-23 (×51): qty 1

## 2019-05-23 MED ORDER — PRISMASOL BGK 4/2.5 32-4-2.5 MEQ/L IV SOLN
INTRAVENOUS | Status: DC
  Administered 2019-05-23 – 2019-05-28 (×23): via INTRAVENOUS_CENTRAL

## 2019-05-23 MED ORDER — METOPROLOL TARTRATE 25 MG PO TABS
100.0000 mg | ORAL_TABLET | Freq: Two times a day (BID) | ORAL | Status: DC
  Administered 2019-05-23 – 2019-05-24 (×2): 100 mg
  Filled 2019-05-23 (×2): qty 4

## 2019-05-23 MED ORDER — METOPROLOL TARTRATE 25 MG PO TABS
75.0000 mg | ORAL_TABLET | Freq: Once | ORAL | Status: AC
  Administered 2019-05-23: 75 mg
  Filled 2019-05-23: qty 3

## 2019-05-23 MED ORDER — INSULIN GLARGINE 100 UNIT/ML ~~LOC~~ SOLN
20.0000 [IU] | Freq: Every day | SUBCUTANEOUS | Status: DC
  Administered 2019-05-23 – 2019-05-24 (×2): 20 [IU] via SUBCUTANEOUS
  Filled 2019-05-23 (×5): qty 0.2

## 2019-05-23 MED ORDER — IPRATROPIUM-ALBUTEROL 20-100 MCG/ACT IN AERS
1.0000 | INHALATION_SPRAY | Freq: Four times a day (QID) | RESPIRATORY_TRACT | Status: DC
  Administered 2019-05-23 – 2019-05-24 (×5): 1 via RESPIRATORY_TRACT
  Filled 2019-05-23: qty 4

## 2019-05-23 MED ORDER — HEPARIN (PORCINE) 25000 UT/250ML-% IV SOLN
1750.0000 [IU]/h | INTRAVENOUS | Status: DC
  Administered 2019-05-23: 1750 [IU]/h via INTRAVENOUS
  Filled 2019-05-23: qty 250

## 2019-05-23 MED ORDER — SODIUM CHLORIDE 0.9 % FOR CRRT
INTRAVENOUS_CENTRAL | Status: DC | PRN
  Filled 2019-05-23: qty 1000

## 2019-05-23 NOTE — Progress Notes (Signed)
[facility] PCCM  Case discussed with Dr. Augustin Coupe Plan to start CRRT on Dylan Goodman given rising BUN  Roselie Awkward, MD [place] PCCM Pager: [phone] Cell: [phone] If no response, call [phone]

## 2019-05-23 NOTE — Progress Notes (Signed)
ANTICOAGULATION CONSULT NOTE - Initial Consult  Pharmacy Consult for Heparin Indication: atrial fibrillation  No Known Allergies  Patient Measurements: Height: 5\' 11"  (180.3 cm) Weight: 205 lb 4 oz (93.1 kg) IBW/kg (Calculated) : 75.3 Heparin Dosing Weight: 92.1kg  Vital Signs: Temp: 98.6 F (37 C) (10/24 1500) Temp Source: Oral (10/24 1500) BP: 113/87 (10/24 1500) Pulse Rate: 117 (10/24 1500)  Labs: Recent Labs    05/21/19 0510  05/22/19 0400 05/23/19 0406 05/23/19 0550  HGB 13.7   < > 11.2* 12.6* 12.0*  HCT 45.2   < > 37.2* 37.0* 39.3  PLT 197  --  146*  --  112*  CREATININE 3.30*  --  3.70*  --  4.07*   < > = values in this interval not displayed.    Estimated Creatinine Clearance: 21.1 mL/min (A) (by C-G formula based on SCr of 4.07 mg/dL (H)).   Medical History: Past Medical History:  Diagnosis Date  . Atrial fibrillation ([place])   . Bleeding gastrointestinal   . CAD (coronary artery disease)    Status post CABG and PCI  . 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 Cardiomyopathy ([place])   . Chronic depression   . CKD (chronic kidney disease)   . Diabetes mellitus ([place])   . Gastric ulcer with hemorrhage   . Gastritis   . GERD (gastroesophageal reflux disease)   . Hx of CABG 12/1994  . IDA (iron deficiency anemia)   . 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 Myocardial infarction ([place]) 2017  . Personal history of radiation therapy 05/30/2018   39 treatments with chemotherapy nasopharyngeal cancer  . 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    Medications:  Scheduled:  . calcitRIOL  0.25 mcg Per Tube Daily  . Chlorhexidine Gluconate Cloth  6 each Topical Daily  . diltiazem  30 mg Oral Q6H  . famotidine  20 mg Per Tube Daily  . insulin aspart  0-20 Units Subcutaneous Q4H  . insulin glargine  20 Units Subcutaneous QHS  . Ipratropium-Albuterol  1 puff Inhalation Q6H  . levETIRAcetam  500 mg Per Tube BID  .  Melatonin  6 mg Per Tube QHS  . metoprolol tartrate  100 mg Per Tube BID  . mometasone-formoterol  2 puff Inhalation BID  . polyethylene glycol  17 g Oral BID  . senna  1 tablet Oral BID  . sodium chloride flush  3 mL Intravenous Q12H   Infusions:  .  prismasol BGK 4/2.5    .  prismasol BGK 4/2.5    . sodium chloride 250 mL (05/23/19 0151)  . feeding supplement (VITAL 1.5 CAL) 1,000 mL (05/23/19 0411)  . phenylephrine (NEO-SYNEPHRINE) Adult infusion 15 mcg/min (05/23/19 0600)  . prismasol BGK 4/2.5     PRN: sodium chloride, albuterol, heparin, hydrOXYzine, sodium chloride, sodium chloride flush, zolpidem  Assessment: 68 yo male previously hospitalized for COVID pneumonia 9/14-9/19 then returned for seizure-like activity and subsequent cardiac arrest 10/7.  On chronic apixaban for afib.  Plan initiation of CRRT 10/24 for AKI with worsening uremia.  Pharmacy consulted to convert to IV heparin.  Last dose of apixaban given at 09:23 today. Hgb low but stable at 12, Plts trending down 112k.  Goal of Therapy:  APTT 66-102 seconds, once effects of apixaban have diminished, will target Heparin level 0.3-0.7 units/ml  Monitor platelets by anticoagulation protocol: Yes   Plan:   Check aPTT and heparin level tonight prior to  starting heparin   Begin IV heparin 12hr after last apixaban dose (22:00) without a bolus  Check aPTT and heparin level 8hrs after starting heparin; once both begin to correlate, will adjust heparin based on heparin levels only  Daily CBC, monitor for signs/symptoms of bleeding   Peggyann Juba, PharmD, BCPS Pharmacy: [phone] 05/23/2019,4:50 PM

## 2019-05-23 NOTE — Progress Notes (Signed)
[facility] Progress Note   Called back to see pt on 05/20/19.  He is a 68 year old WM with ICM- EF 40-45%, Afib, DM and also CKD (recent best crt baseline around 2).  He was hosp at [facility] from 9/14-9/19- discharged, then readmitted 10/6 to [facility]-  S/p cardiac arrest on 10/7.  He suffered A on CRF in that setting with crt peaking at 5.17 on 10/10, then improved slowly - renal service signed off. Transferred to the ICU for hypoxia and intubated 10/21 and treated with remdesevir again -> just extubated 10/24.   Assessment/ Plan:    Renal-  Baseline CKD- crt 2-  This is second event of A on CRF this hosp. Creat / Bun stable, no indication RRT yet.  Per CCM feel by CT patient may be intravasc wet, IV lasix started 10/22.  2.0L/4.2L and  1L so far today. Diuresis held earlier today bec of rising BUN and steroids on hold as well for the same reason; no e/o bleed. - d/w Dr. [place] Bells and will initiate CRRT given he was just extubated, e/o overload on CT scan and not tolerating diuresis with BUN steadily increasing to well over 100.   SP cardiac arrest - on 10/07  Atrial fib w/ RVR - on po metoprolol bid 12.5 for rate control  Hypotension - on pressors  COVID PNA/ underlying COPD - per CCM hopefully some of CT changes are due to vol excess, if not there will be not much hope for recovering  Hyperkalemia-  Better  Hypernatremia - worse w/ diuresis  Anemia- not a significant issue   HTN- BP's on the softer side today, on pressors watch closely  VDRF - per CCM  CAD h/o CABG  DM2  Subjective:   Diarrhea, no melena noted, +cough    Objective:   BP 113/87   Pulse (!) 117   Temp 98.6 F (37 C) (Oral)   Resp 19   Ht 5\' 11"  (1.803 m)   Wt 93.1 kg   SpO2 91%   BMI 28.63 kg/m   Intake/Output Summary (Last 24 hours) at 05/23/2019 1621 Last data filed at 05/23/2019 1253 Gross per 24 hour  Intake 1911.77 ml  Output 3465 ml  Net -1553.23 ml   Weight change:    Physical Exam: Extubated with rhonchi and tachypneic   No jvd  Cor reg no RG   Abd obese, soft nontender ? Ascites   Ext no pretib edema   Neuro- responsive  Imaging: Dg Chest Port 1 View  Result Date: 05/23/2019 CLINICAL DATA:  pneunmonia due to COVID-19. EXAM: PORTABLE CHEST 1 VIEW COMPARISON:  05/20/2019 FINDINGS: Stable position of ET tube, enteric tube, and right IJ catheter. Previous median sternotomy and CABG. Normal heart size. Interval improvement in bilateral interstitial and patchy airspace opacities. No pleural effusion identified. IMPRESSION: 1. Interval improvement in bilateral interstitial and airspace opacities compatible with resolving pneumonia. 2. Stable support apparatus. Electronically Signed   By: Kerby Moors M.D.   On: 05/23/2019 06:43    Labs: BMET Recent Labs  Lab 05/18/19 4097 05/19/19 0348 05/20/19 0452 05/20/19 1340 05/20/19 1416 05/21/19 0510 05/21/19 0542 05/21/19 1615 05/21/19 1720 05/22/19 0400 05/23/19 0406 05/23/19 0550  NA 142 140 144 142 145 147* 144  --   --  148* 150* 151*  K 5.0 5.3* 6.0* 5.2* 5.3* 4.9 4.7  --   --  3.9 3.5 3.4*  CL 103 103 105  --  108 107  --   --   --  111  --  110  CO2 24 23 25   --  24 23  --   --   --  23  --  26  GLUCOSE 228* 235* 293*  --  246* 135*  --   --   --  363*  --  287*  BUN 97* 96* 103*  --  105* 107*  --   --   --  139*  --  169*  CREATININE 3.09* 2.75* 3.25*  --  3.20* 3.30*  --   --   --  3.70*  --  4.07*  CALCIUM 9.1 8.8* 9.1  --  8.7* 9.1  --   --   --  8.3*  --  8.4*  PHOS  --   --  7.0*  --   --   --   --  4.9* 5.2*  --   --   --    CBC Recent Labs  Lab 05/21/19 0510 05/21/19 0542 05/22/19 0400 05/23/19 0406 05/23/19 0550  WBC 14.6*  --  13.7*  --  14.1*  HGB 13.7 13.3 11.2* 12.6* 12.0*  HCT 45.2 39.0 37.2* 37.0* 39.3  MCV 99.3  --  98.9  --  101.0*  PLT 197  --  146*  --  112*    Medications:    . apixaban  5 mg Per Tube BID  . calcitRIOL  0.25 mcg Per Tube Daily  .  Chlorhexidine Gluconate Cloth  6 each Topical Daily  . diltiazem  30 mg Oral Q6H  . famotidine  20 mg Per Tube Daily  . insulin aspart  0-20 Units Subcutaneous Q4H  . insulin glargine  20 Units Subcutaneous QHS  . Ipratropium-Albuterol  1 puff Inhalation Q6H  . levETIRAcetam  500 mg Per Tube BID  . Melatonin  6 mg Per Tube QHS  . metoprolol tartrate  100 mg Per Tube BID  . mometasone-formoterol  2 puff Inhalation BID  . polyethylene glycol  17 g Oral BID  . senna  1 tablet Oral BID  . sodium chloride flush  3 mL Intravenous Q12H      Otelia Santee, MD 05/23/2019, 4:21 PM

## 2019-05-23 NOTE — Progress Notes (Signed)
[facility] PCCM  Patient and wife updated by me re initiation of CVVHD  Roselie Awkward, MD [place] PCCM Pager: [phone] Cell: [phone] If no response, call [phone]

## 2019-05-23 NOTE — Progress Notes (Signed)
Dylan Goodman  WCB:[id number] DOB: 03/11/1951 DOA: 2019-06-03 PCP: Steele Sizer, MD    Brief Narrative:  68 year old with a history of atrial fibrillation, GIB, CAD status post CABG, nasopharyngeal cancer, CKD, DM, and GERD who was previously treated for Covid pneumonia 9/17 and returned to the [facility] ED with reported seizure-like behavior, and subsequently suffered a cardiac arrest.  He was intubated and transferred to [facility] ICU.  Significant Events: 9/14 > 9/19 admitted to the [facility] for Covid pneumonia 10/5 ED evaluation with chest pain -discharged home 10/6 ED evaluation for seizure -CT head negative for acute findings 06/03/2023 coded while in ED awaiting placement -intubated 10/8 extubated -transferred out of ICU 10/15 hypoxia requiring PCCM consultation 10/19 high resolution CT -cardiomegaly, small pleural effusions, extensive patchy consolidation from last opacities -moderate to severe emphysema 10/21 intubation -vasc cath placement -transfer to [facility]  COVID-19 specific Treatment: Remdesivir 10/20 > 10/24  Subjective: Much more alert.  Interactive.  Conversant.  Denies shortness of breath chest pain nausea or vomiting.  Appears to be ready for extubation.  Assessment & Plan:  Acute hypoxic respiratory failure unclear etiology Ventilator management per PCCM - possible fibrosis versus edema -much improved at this time -plan for extubation today  Covid pneumonia September 2020 Now completing a second course of remdesivir - stopped Decadron given impressive uremia -not clinically convinced this represents recurrent or continued viral pneumonia  Uremia  BUN continues to climb to impressive level - pt is remarkably altert - no rub on cardiac exam - steroids stopped - lasix to stop today - follow trend   Recent Labs  Lab 05/20/19 0452 05/20/19 1416 05/21/19 0510 05/22/19 0400 05/23/19 0550  BUN 103* 105* 107* 139* 169*    Seizure At presentation -questionably  related to cardiac arrest -on Keppra for now -no evidence of recurrence  Chronic atrial fibrillation Rate climbing - resuming home dose of Lopressor - supplement with additional IV beta-blocker doses as needed - chronically on Eliquis which is being continued here -add short acting Cardizem if tachycardia persists  Mild systolic CHF TTE 61/6 noted EF 45-50%  DM 2 CBG remains elevated -adjust treatment again today and follow  Acute kidney injury on CKD Creatinine peaked at 5.17 10/10 -baseline creatinine 2 - Nephrology following -has not yet required CRRT - continue to follow creatinine trend  Recent Labs  Lab 05/20/19 0452 05/20/19 1416 05/21/19 0510 05/22/19 0400 05/23/19 0550  CREATININE 3.25* 3.20* 3.30* 3.70* 4.07*    Hyperkalemia > Hypokalemia  Resolved with Lokelma dosing -now modestly hypokalemic in setting of atrial fibrillation -recheck later today and consider low-dose supplementation  Mild transaminitis Possible mild shock liver versus effect of remdesivir versus right heart failure/congestion - follow trend  Deconditioning PT/OT - mobilize   DVT prophylaxis: Eliquis Code Status: FULL CODE Family Communication: Per PCCM Disposition Plan: ICU - possible transfer to PCU 10/25 if remains stable post-extubation   Consultants:  Neurology Cardiology Nephrology PCCM  Antimicrobials:  Zosyn Jun 03, 2023 > 10/11  Objective: Blood pressure 110/67, pulse (!) 110, temperature 98.4 F (36.9 C), temperature source Axillary, resp. rate (!) 22, height '5\' 11"'  (1.803 m), weight 93.1 kg, SpO2 100 %.  Intake/Output Summary (Last 24 hours) at 05/23/2019 0808 Last data filed at 05/23/2019 0800 Gross per 24 hour  Intake 2527.53 ml  Output 3275 ml  Net -747.47 ml   Filed Weights   05/20/19 0314 05/21/19 0500 05/22/19 0500  Weight: 88.9 kg 92.6 kg 93.1 kg    Examination:  General: Stable on ventilator -alert and conversant Lungs: Stable diffuse fine crackles with no wheezing  Cardiovascular: Irregularly irregular with tachycardia without murmur Abdomen: Overweight, soft, bowel sounds present, no rebound Extremities: No edema B LE   CBC: Recent Labs  Lab 05/21/19 0510 05/21/19 0542 05/22/19 0400 05/23/19 0406  WBC 14.6*  --  13.7*  --   HGB 13.7 13.3 11.2* 12.6*  HCT 45.2 39.0 37.2* 37.0*  MCV 99.3  --  98.9  --   PLT 197  --  146*  --    Basic Metabolic Panel: Recent Labs  Lab 05/20/19 0452  05/21/19 0510  05/21/19 1615 05/21/19 1720 05/22/19 0400 05/23/19 0406 05/23/19 0550  NA 144   < > 147*   < >  --   --  148* 150* 151*  K 6.0*   < > 4.9   < >  --   --  3.9 3.5 3.4*  CL 105   < > 107  --   --   --  111  --  110  CO2 25   < > 23  --   --   --  23  --  26  GLUCOSE 293*   < > 135*  --   --   --  363*  --  287*  BUN 103*   < > 107*  --   --   --  139*  --  169*  CREATININE 3.25*   < > 3.30*  --   --   --  3.70*  --  4.07*  CALCIUM 9.1   < > 9.1  --   --   --  8.3*  --  8.4*  MG 2.4  --   --   --  2.3 2.1  --   --   --   PHOS 7.0*  --   --   --  4.9* 5.2*  --   --   --    < > = values in this interval not displayed.   GFR: Estimated Creatinine Clearance: 21.1 mL/min (A) (by C-G formula based on SCr of 4.07 mg/dL (H)).  Liver Function Tests: Recent Labs  Lab 05/20/19 0452 05/21/19 0510 05/22/19 0400 05/23/19 0550  AST 62* 46* 74* 75*  ALT 123* 105* 104* 122*  ALKPHOS 170* 149* 190* 202*  BILITOT 2.0* 1.6* 1.3* 1.1  PROT 6.0* 5.9* 4.8* 4.9*  ALBUMIN 2.9* 2.9* 2.4* 2.2*    HbA1C: Hgb A1c MFr Bld  Date/Time Value Ref Range Status  03/12/2019 07:14 AM 6.7 (H) 4.8 - 5.6 % Final    Comment:    (NOTE) Pre diabetes:          5.7%-6.4% Diabetes:              >6.4% Glycemic control for   <7.0% adults with diabetes   09/08/2018 12:03 PM 6.3 (H) <5.7 % of total Hgb Final    Comment:    For someone without known diabetes, a hemoglobin  A1c value between 5.7% and 6.4% is consistent with prediabetes and should be confirmed with a   follow-up test. . For someone with known diabetes, a value <7% indicates that their diabetes is well controlled. A1c targets should be individualized based on duration of diabetes, age, comorbid conditions, and other considerations. . This assay result is consistent with an increased risk of diabetes. . Currently, no consensus exists regarding use of hemoglobin A1c for diagnosis of diabetes for children. Marland Kitchen  CBG: Recent Labs  Lab 05/22/19 1651 05/22/19 2042 05/23/19 0024 05/23/19 0336 05/23/19 0729  GLUCAP 283* 264* 268* 287* 254*     Scheduled Meds: . apixaban  5 mg Per Tube BID  . budesonide (PULMICORT) nebulizer solution  0.25 mg Nebulization BID  . calcitRIOL  0.25 mcg Per Tube Daily  . chlorhexidine gluconate (MEDLINE KIT)  15 mL Mouth Rinse BID  . Chlorhexidine Gluconate Cloth  6 each Topical Daily  . famotidine  20 mg Per Tube Daily  . feeding supplement (PRO-STAT SUGAR FREE 64)  30 mL Per Tube QID  . insulin aspart  0-20 Units Subcutaneous Q4H  . insulin glargine  12 Units Subcutaneous QHS  . levETIRAcetam  500 mg Per Tube BID  . mouth rinse  15 mL Mouth Rinse 10 times per day  . Melatonin  6 mg Per Tube QHS  . metoprolol tartrate  12.5 mg Per Tube BID  . polyethylene glycol  17 g Oral Daily  . sodium chloride flush  3 mL Intravenous Q12H     LOS: 17 days   Cherene Altes, MD [facility] Office  [phone] Pager - Text Page per Amion  If 7PM-7AM, please contact night-coverage per Amion 05/23/2019, 8:08 AM

## 2019-05-23 NOTE — Procedures (Signed)
Extubation Procedure Note  Patient Details:   Name: Dylan Goodman DOB: 03/28/1951 MRN: [id number]   Airway Documentation:    Vent end date: 05/23/19 Vent end time: 1100   Evaluation  O2 sats: stable throughout Complications: No apparent complications Patient did tolerate procedure well. Bilateral Breath Sounds: Diminished   Yes   Pt extubated to 3L HFNC per MD order. Positive cuff leak noted prior to extubation. Pt has a strong productive cough and is using Yankauer to clear secretions. Pt able to speak and shows no increased WOB, no SOB, VS within normal limits. RT will continue to monitor  Jesse Sans 05/23/2019, 12:29 PM

## 2019-05-23 NOTE — Progress Notes (Signed)
NAME:  Dylan Goodman, MRN:  [id number], DOB:  January 25, 1951, LOS: 62 ADMISSION DATE:  05-09-19, CONSULTATION DATE:  May 09, 2023 REFERRING MD:  Reino Kent EDP, CHIEF COMPLAINT:  Cardiac arrest   Brief History   68 y/o male treated for COVID pneumonia on September 13 returned to the ED on May 09, 2023 with seizure and cardiac arrest.  Transitioned to floor where he had AKI which resolved but then later in his admission developed worsening respiratory failure and renal failure again.  Ultimately re-intubated on 10/21 and sent back to [facility].   Past Medical History  Systolic heart failure LVEF 40-45% Atrial fib GI bleed CAD Depression CKD DM2 Gastric ulcer GERD CABG Iron def anemia Skin cancer  Significant Hospital Events   9/14-9/19 admit to [facility] for COVID pneumonia.  10/05 to ED for Chest pain. Discharged home after negative cardiac. 10/06 To ED for seizure 05/09/23 Coded in ED while awaiting placement 10/15 PCCM called back  10/17 Remains on 100%, 30L flow 10/20 placed back on remdesevir, when attempting to transfer to [facility] desaturated. Moved to ICU 10/21 renal fxn worse   Consults:  Neurology Cardiology Nephrology PCCM  Procedures:  ETT 09-May-2023 >> 10/8 ETT 10/21 >> R IJ HD Cath 10/21 >>  Significant Diagnostic Tests:  Renal US 9/14 >> WNL Lower extremity doppler 05/09/23 >> CT head 10/6 >> neg CXR 10/15 >> ILD, possible some volume overload Echo 10/9 >> LVEF 45-50%, global normal RV systolic function, LA severely dilated, RA moderately dilated, mild MVR, mild TR, moderately elevated pulmonary artery systolic pressure 44/03 CT chest images personally reviewed: underlying emphysema, bilateral effusion, cardiomegally, bilateral ground glass and consolidation  Micro Data:  05/09/2023 blood > neg 10/8 resp > opf 05/09/23 urine > neg  Antimicrobials/COVID RX  10/20 cefepime > 10/22 10/20 remdesivir >   Interim history/subjective:   Passing SBT  Objective   Blood pressure 110/67, pulse (!)  110, temperature 98.4 F (36.9 C), temperature source Axillary, resp. rate (!) 22, height 5\' 11"  (1.803 m), weight 93.1 kg, SpO2 100 %. CVP:  [0 mmHg-14 mmHg] 7 mmHg  Vent Mode: PRVC FiO2 (%):  [40 %] 40 % Set Rate:  [22 bmp] 22 bmp Vt Set:  [600 mL] 600 mL PEEP:  [8 cmH20] 8 cmH20 Pressure Support:  [10 cmH20] 10 cmH20 Plateau Pressure:  [20 cmH20-22 cmH20] 20 cmH20   Intake/Output Summary (Last 24 hours) at 05/23/2019 0756 Last data filed at 05/23/2019 4742 Gross per 24 hour  Intake 2527.53 ml  Output 2950 ml  Net -422.47 ml   Filed Weights   05/20/19 0314 05/21/19 0500 05/22/19 0500  Weight: 88.9 kg 92.6 kg 93.1 kg    Examination:  General:  In bed on vent HENT: NCAT ETT in place PULM: CTA B, vent supported breathing CV: RRR, no mgr GI: BS+, soft, nontender MSK: normal bulk and tone Neuro: awake, alert, following commands   10/21 CXR > ETT in place, NG in place, CVL in good position, small bilateral effusions  Resolved Hospital Problem list     Assessment & Plan:  AKI: appears dry/euvolemic on physical exam but lungs look wet on CT chest from 10/19 (pleural effusions, pulmonary hypertension, interlobular septal thickening) Hold lasix until further discussion with nephrology Leave HD cath in place until decision made about hemodialysis Hold steroids with high BUN  Acute respiratory failure with hypoxemia: likely post inflammatory changes as well as some degree of volume overload COVID 19 : late phase ARDS? 10/24 significantly improved Extubate  today  SLP evaluation after Administer O2 to maintain O2 saturation > 85% Lasix on hold today  Underlying emphysema Scheduled combivent (change to this) Start dulera in lieu of home symbicort  Seizure: on admission, related to cardiac arrest/hypoxemia, none since keppra Continue keppra 500mg  bid  Systolic heart failure: lungs appear wet to me on chest imaging Continue metoprolol  Atrial fib Tele Eliquis    Best practice:  Diet: tube feeding Pain/Anxiety/Delirium protocol (if indicated): d/c today  VAP protocol (if indicated): d/c DVT prophylaxis: eliquis GI prophylaxis: famotidine, renal dosing Glucose control: per TRH Mobility: bed rest Code Status: full Family Communication: I updated his wife Mardene Celeste by phone Disposition: remain in ICU  Labs   CBC: Recent Labs  Lab 05/20/19 1340 05/21/19 0510 05/21/19 0542 05/22/19 0400 05/23/19 0406  WBC  --  14.6*  --  13.7*  --   HGB 13.3 13.7 13.3 11.2* 12.6*  HCT 39.0 45.2 39.0 37.2* 37.0*  MCV  --  99.3  --  98.9  --   PLT  --  197  --  146*  --     Basic Metabolic Panel: Recent Labs  Lab 05/18/19 0641 05/19/19 0348 05/20/19 0452  05/20/19 1416 05/21/19 0510 05/21/19 0542 05/21/19 1615 05/21/19 1720 05/22/19 0400 05/23/19 0406 05/23/19 0550  NA 142 140 144   < > 145 147* 144  --   --  148* 150* 151*  K 5.0 5.3* 6.0*   < > 5.3* 4.9 4.7  --   --  3.9 3.5 3.4*  CL 103 103 105  --  108 107  --   --   --  111  --  110  CO2 24 23 25   --  24 23  --   --   --  23  --  26  GLUCOSE 228* 235* 293*  --  246* 135*  --   --   --  363*  --  287*  BUN 97* 96* 103*  --  105* 107*  --   --   --  139*  --  PENDING  CREATININE 3.09* 2.75* 3.25*  --  3.20* 3.30*  --   --   --  3.70*  --  4.07*  CALCIUM 9.1 8.8* 9.1  --  8.7* 9.1  --   --   --  8.3*  --  8.4*  MG 1.7 2.5* 2.4  --   --   --   --  2.3 2.1  --   --   --   PHOS  --   --  7.0*  --   --   --   --  4.9* 5.2*  --   --   --    < > = values in this interval not displayed.   GFR: Estimated Creatinine Clearance: 21.1 mL/min (A) (by C-G formula based on SCr of 4.07 mg/dL (H)). Recent Labs  Lab 05/19/19 0348 05/20/19 0452 05/21/19 0510 05/22/19 0400  PROCALCITON 0.32 0.34  --   --   WBC  --   --  14.6* 13.7*    Liver Function Tests: Recent Labs  Lab 05/20/19 0452 05/21/19 0510 05/22/19 0400 05/23/19 0550  AST 62* 46* 74* 75*  ALT 123* 105* 104* 122*  ALKPHOS 170* 149*  190* 202*  BILITOT 2.0* 1.6* 1.3* 1.1  PROT 6.0* 5.9* 4.8* 4.9*  ALBUMIN 2.9* 2.9* 2.4* 2.2*   No results for input(s): LIPASE, AMYLASE in the last 168 hours. No  results for input(s): AMMONIA in the last 168 hours.  ABG    Component Value Date/Time   PHART 7.473 (H) 05/23/2019 0406   PCO2ART 34.9 05/23/2019 0406   PO2ART 106.0 05/23/2019 0406   HCO3 25.7 05/23/2019 0406   TCO2 27 05/23/2019 0406   ACIDBASEDEF 2.9 (H) 05/14/2019 0625   O2SAT 99.0 05/23/2019 0406     Coagulation Profile: No results for input(s): INR, PROTIME in the last 168 hours.  Cardiac Enzymes: No results for input(s): CKTOTAL, CKMB, CKMBINDEX, TROPONINI in the last 168 hours.  HbA1C: Hgb A1c MFr Bld  Date/Time Value Ref Range Status  03/12/2019 07:14 AM 6.7 (H) 4.8 - 5.6 % Final    Comment:    (NOTE) Pre diabetes:          5.7%-6.4% Diabetes:              >6.4% Glycemic control for   <7.0% adults with diabetes   09/08/2018 12:03 PM 6.3 (H) <5.7 % of total Hgb Final    Comment:    For someone without known diabetes, a hemoglobin  A1c value between 5.7% and 6.4% is consistent with prediabetes and should be confirmed with a  follow-up test. . For someone with known diabetes, a value <7% indicates that their diabetes is well controlled. A1c targets should be individualized based on duration of diabetes, age, comorbid conditions, and other considerations. . This assay result is consistent with an increased risk of diabetes. . Currently, no consensus exists regarding use of hemoglobin A1c for diagnosis of diabetes for children. .     CBG: Recent Labs  Lab 05/22/19 1651 05/22/19 2042 05/23/19 0024 05/23/19 0336 05/23/19 0729  GLUCAP 283* 264* 268* 287* 254*       Critical care time: 35 minutes     Roselie Awkward, MD [place] [facility] Pager: [phone] Cell: [phone] If no response, call [phone]

## 2019-05-24 DIAGNOSIS — I4891 Unspecified atrial fibrillation: Secondary | ICD-10-CM | POA: Diagnosis not present

## 2019-05-24 DIAGNOSIS — U071 COVID-19: Secondary | ICD-10-CM | POA: Diagnosis not present

## 2019-05-24 DIAGNOSIS — N179 Acute kidney failure, unspecified: Secondary | ICD-10-CM | POA: Diagnosis not present

## 2019-05-24 DIAGNOSIS — J9601 Acute respiratory failure with hypoxia: Secondary | ICD-10-CM | POA: Diagnosis not present

## 2019-05-24 LAB — GLUCOSE, CAPILLARY
Glucose-Capillary: 135 mg/dL — ABNORMAL HIGH (ref 70–99)
Glucose-Capillary: 122 mg/dL — ABNORMAL HIGH (ref 70–99)
Glucose-Capillary: 105 mg/dL — ABNORMAL HIGH (ref 70–99)
Glucose-Capillary: 113 mg/dL — ABNORMAL HIGH (ref 70–99)

## 2019-05-24 LAB — RENAL FUNCTION PANEL
Albumin: 2.5 g/dL — ABNORMAL LOW (ref 3.5–5.0)
Albumin: 2.4 g/dL — ABNORMAL LOW (ref 3.5–5.0)
Anion gap: 10 (ref 5–15)
Anion gap: 9 (ref 5–15)
BUN: 95 mg/dL — ABNORMAL HIGH (ref 8–23)
BUN: 68 mg/dL — ABNORMAL HIGH (ref 8–23)
CO2: 28 mmol/L (ref 22–32)
CO2: 26 mmol/L (ref 22–32)
Calcium: 8.3 mg/dL — ABNORMAL LOW (ref 8.9–10.3)
Calcium: 7.7 mg/dL — ABNORMAL LOW (ref 8.9–10.3)
Chloride: 109 mmol/L (ref 98–111)
Chloride: 107 mmol/L (ref 98–111)
Creatinine, Ser: 2.13 mg/dL — ABNORMAL HIGH (ref 0.61–1.24)
Creatinine, Ser: 1.68 mg/dL — ABNORMAL HIGH (ref 0.61–1.24)
GFR calc Af Amer: 37 mL/min — ABNORMAL LOW (ref >60)
GFR calc Af Amer: 49 mL/min — ABNORMAL LOW (ref >60)
GFR calc non Af Amer: 32 mL/min — ABNORMAL LOW (ref >60)
GFR calc non Af Amer: 42 mL/min — ABNORMAL LOW (ref >60)
Glucose, Bld: 131 mg/dL — ABNORMAL HIGH (ref 70–99)
Glucose, Bld: 115 mg/dL — ABNORMAL HIGH (ref 70–99)
Phosphorus: 3.1 mg/dL (ref 2.5–4.6)
Phosphorus: 3.1 mg/dL (ref 2.5–4.6)
Potassium: 3.8 mmol/L (ref 3.5–5.1)
Potassium: 3.8 mmol/L (ref 3.5–5.1)
Sodium: 147 mmol/L — ABNORMAL HIGH (ref 135–145)
Sodium: 142 mmol/L (ref 135–145)

## 2019-05-24 LAB — COMPREHENSIVE METABOLIC PANEL
ALT: 121 U/L — ABNORMAL HIGH (ref 0–44)
AST: 60 U/L — ABNORMAL HIGH (ref 15–41)
Albumin: 2.6 g/dL — ABNORMAL LOW (ref 3.5–5.0)
Alkaline Phosphatase: 235 U/L — ABNORMAL HIGH (ref 38–126)
Anion gap: 11 (ref 5–15)
BUN: 97 mg/dL — ABNORMAL HIGH (ref 8–23)
CO2: 27 mmol/L (ref 22–32)
Calcium: 8.3 mg/dL — ABNORMAL LOW (ref 8.9–10.3)
Chloride: 107 mmol/L (ref 98–111)
Creatinine, Ser: 2.18 mg/dL — ABNORMAL HIGH (ref 0.61–1.24)
GFR calc Af Amer: 36 mL/min — ABNORMAL LOW (ref >60)
GFR calc non Af Amer: 31 mL/min — ABNORMAL LOW (ref >60)
Glucose, Bld: 130 mg/dL — ABNORMAL HIGH (ref 70–99)
Potassium: 3.7 mmol/L (ref 3.5–5.1)
Sodium: 145 mmol/L (ref 135–145)
Total Bilirubin: 2 mg/dL — ABNORMAL HIGH (ref 0.3–1.2)
Total Protein: 5.8 g/dL — ABNORMAL LOW (ref 6.5–8.1)

## 2019-05-24 LAB — CBC
HCT: 48.9 % (ref 39.0–52.0)
Hemoglobin: 14.4 g/dL (ref 13.0–17.0)
MCH: 30 pg (ref 26.0–34.0)
MCHC: 29.4 g/dL — ABNORMAL LOW (ref 30.0–36.0)
MCV: 101.9 fL — ABNORMAL HIGH (ref 80.0–100.0)
Platelets: 120 10*3/uL — ABNORMAL LOW (ref 150–400)
RBC: 4.8 MIL/uL (ref 4.22–5.81)
RDW: 19.7 % — ABNORMAL HIGH (ref 11.5–15.5)
WBC: 18.3 10*3/uL — ABNORMAL HIGH (ref 4.0–10.5)
nRBC: 0 % (ref 0.0–0.2)

## 2019-05-24 LAB — APTT
aPTT: 190 seconds (ref 24–36)
aPTT: 32 seconds (ref 24–36)

## 2019-05-24 LAB — HEPARIN LEVEL (UNFRACTIONATED): Heparin Unfractionated: >2.2 IU/mL — ABNORMAL HIGH (ref 0.30–0.70)

## 2019-05-24 LAB — TROPONIN I (HIGH SENSITIVITY)
Troponin I (High Sensitivity): 261 ng/L (ref <18)
Troponin I (High Sensitivity): 774 ng/L (ref <18)

## 2019-05-24 LAB — MAGNESIUM: Magnesium: 2.1 mg/dL (ref 1.7–2.4)

## 2019-05-24 MED ORDER — MELATONIN 3 MG PO TABS
6.0000 mg | ORAL_TABLET | Freq: Every day | ORAL | Status: DC
  Administered 2019-05-24 – 2019-06-04 (×12): 6 mg via ORAL
  Filled 2019-05-24 (×14): qty 2

## 2019-05-24 MED ORDER — IPRATROPIUM-ALBUTEROL 0.5-2.5 (3) MG/3ML IN SOLN: 3.0000 mL | Freq: Four times a day (QID) | RESPIRATORY_TRACT | Status: DC

## 2019-05-24 MED ORDER — HYDROXYZINE HCL 25 MG PO TABS
25.0000 mg | ORAL_TABLET | Freq: Three times a day (TID) | ORAL | Status: DC | PRN
  Administered 2019-05-27 – 2019-05-29 (×2): 25 mg via ORAL
  Filled 2019-05-24 (×5): qty 1

## 2019-05-24 MED ORDER — CALCITRIOL 0.25 MCG PO CAPS
0.2500 ug | ORAL_CAPSULE | Freq: Every day | ORAL | Status: DC
  Administered 2019-05-24 – 2019-06-05 (×13): 0.25 ug via ORAL
  Filled 2019-05-24 (×15): qty 1

## 2019-05-24 MED ORDER — FAMOTIDINE 20 MG PO TABS
20.0000 mg | ORAL_TABLET | Freq: Every day | ORAL | Status: DC
  Administered 2019-05-24: 20 mg via ORAL
  Filled 2019-05-24: qty 1

## 2019-05-24 MED ORDER — METOPROLOL TARTRATE 25 MG PO TABS
100.0000 mg | ORAL_TABLET | Freq: Two times a day (BID) | ORAL | Status: DC
  Administered 2019-05-24: 100 mg via ORAL
  Filled 2019-05-24 (×4): qty 4

## 2019-05-24 MED ORDER — HEPARIN (PORCINE) 25000 UT/250ML-% IV SOLN
1550.0000 [IU]/h | INTRAVENOUS | Status: DC
  Administered 2019-05-24: 1550 [IU]/h via INTRAVENOUS
  Filled 2019-05-24: qty 250

## 2019-05-24 MED ORDER — METOPROLOL TARTRATE 5 MG/5ML IV SOLN
5.0000 mg | Freq: Once | INTRAVENOUS | Status: AC
  Administered 2019-05-24: 5 mg via INTRAVENOUS
  Filled 2019-05-24: qty 5

## 2019-05-24 MED ORDER — IPRATROPIUM-ALBUTEROL 20-100 MCG/ACT IN AERS
1.0000 | INHALATION_SPRAY | Freq: Four times a day (QID) | RESPIRATORY_TRACT | Status: DC
  Administered 2019-05-24 – 2019-06-01 (×29): 1 via RESPIRATORY_TRACT
  Filled 2019-05-24: qty 4

## 2019-05-24 MED ORDER — PANTOPRAZOLE SODIUM 40 MG PO TBEC
40.0000 mg | DELAYED_RELEASE_TABLET | Freq: Every day | ORAL | Status: DC
  Administered 2019-05-25 – 2019-06-05 (×12): 40 mg via ORAL
  Filled 2019-05-24 (×12): qty 1

## 2019-05-24 MED ORDER — LEVETIRACETAM 500 MG PO TABS
500.0000 mg | ORAL_TABLET | Freq: Two times a day (BID) | ORAL | Status: DC
  Administered 2019-05-24 – 2019-06-05 (×25): 500 mg via ORAL
  Filled 2019-05-24 (×27): qty 1

## 2019-05-24 NOTE — Progress Notes (Addendum)
ANTICOAGULATION CONSULT NOTE - Follow Up Consult  Pharmacy Consult for Heparin Indication: atrial fibrillation  No Known Allergies  Patient Measurements: Height: 5\' 11"  (180.3 cm) Weight: 198 lb 6.6 oz (90 kg) IBW/kg (Calculated) : 75.3 Heparin Dosing Weight: 92 kg  Vital Signs: Temp: 98 F (36.7 C) (10/25 1600) Temp Source: Axillary (10/25 1600) BP: 91/68 (10/25 1615) Pulse Rate: 36 (10/25 1615)  Labs: Recent Labs    05/22/19 0400 05/23/19 0406 05/23/19 0550  05/23/19 1943 05/23/19 1949 05/24/19 0534 05/24/19 0535 05/24/19 1500 05/24/19 1630  HGB 11.2* 12.6* 12.0*  --   --   --  14.4  --   --   --   HCT 37.2* 37.0* 39.3  --   --   --  48.9  --   --   --   PLT 146*  --  112*  --   --   --  120*  --   --   --   APTT  --   --   --   --   --  33  --  190*  --  32  HEPARINUNFRC  --   --   --   --  >2.20*  --   --  >2.20*  --   --   CREATININE 3.70*  --  4.07*   < >  --   --  2.18* 2.13* 1.68*  --    < > = values in this interval not displayed.    Estimated Creatinine Clearance: 46.7 mL/min (A) (by C-G formula based on SCr of 1.68 mg/dL (H)).   Medications:  Infusions:  .  prismasol BGK 4/2.5 500 mL/hr at 05/24/19 0539  .  prismasol BGK 4/2.5 300 mL/hr at 05/24/19 1028  . sodium chloride 250 mL (05/23/19 0151)  . heparin 1,550 Units/hr (05/24/19 0904)  . phenylephrine (NEO-SYNEPHRINE) Adult infusion 10 mcg/min (05/24/19 1455)  . prismasol BGK 4/2.5 1,500 mL/hr at 05/24/19 9147    Assessment: 68 yo male previously hospitalized for COVID pneumonia 9/14-9/19 then returned for seizure-like activity and subsequent cardiac arrest 10/7.  On chronic apixaban for afib. Starting CRRT 10/24 for AKI with worsening uremia.  Pharmacy consulted to convert apixaban to IV heparin.  Last dose of apixaban given 10/24 at 09:23 AM.    First HL > 2.2 as expected, falsely elevated d/t recent apixaban APTT now supratherapeutic (>200). RN drew level from same line as heparin running  (purple lumen of the trialysis) - she paused the heparin for ~7 min then wasted 27ml then drew heparin level. There could still be some heparin contaminating level. No infusion or bleeding reported.  Goal of Therapy:  Heparin level 0.3-0.7 units/ml aPTT 66-102 seconds Monitor platelets by anticoagulation protocol: Yes   Plan:  Hold heparin x 1 hour Resume heparin at 1350 units/hr APTT 6 hours post restart (will get this on as a lab draw peripherally to make sure no heparin is contaminating) Will also draw heparin level at this time although expect to still be elevated due to apixaban  Sherlon Handing, PharmD, BCPS [facility] Clinical pharmacist phone [phone] 05/24/2019 5:30 PM

## 2019-05-24 NOTE — Progress Notes (Signed)
ANTICOAGULATION CONSULT NOTE - Follow Up Consult  Pharmacy Consult for Heparin Indication: atrial fibrillation  No Known Allergies  Patient Measurements: Height: 5\' 11"  (180.3 cm) Weight: 198 lb 6.6 oz (90 kg) IBW/kg (Calculated) : 75.3 Heparin Dosing Weight: 92 kg  Vital Signs: Temp: 97.3 F (36.3 C) (10/25 0400) Temp Source: Axillary (10/25 0400) BP: 90/60 (10/25 0819) Pulse Rate: 31 (10/25 0715)  Labs: Recent Labs    05/22/19 0400 05/23/19 0406 05/23/19 0550 05/23/19 1630 05/23/19 1943 05/23/19 1949 05/24/19 0534 05/24/19 0535  HGB 11.2* 12.6* 12.0*  --   --   --  14.4  --   HCT 37.2* 37.0* 39.3  --   --   --  48.9  --   PLT 146*  --  112*  --   --   --  120*  --   APTT  --   --   --   --   --  33  --  190*  HEPARINUNFRC  --   --   --   --  >2.20*  --   --  >2.20*  CREATININE 3.70*  --  4.07* 3.71*  --   --  2.18* 2.13*    Estimated Creatinine Clearance: 36.8 mL/min (A) (by C-G formula based on SCr of 2.13 mg/dL (H)).   Medications:  Infusions:  .  prismasol BGK 4/2.5 500 mL/hr at 05/24/19 0539  .  prismasol BGK 4/2.5    . sodium chloride 250 mL (05/23/19 0151)  . feeding supplement (VITAL 1.5 CAL) 1,000 mL (05/23/19 0411)  . heparin Stopped (05/24/19 0730)  . phenylephrine (NEO-SYNEPHRINE) Adult infusion Stopped (05/23/19 0906)  . prismasol BGK 4/2.5 1,500 mL/hr at 05/24/19 1941    Assessment: 68 yo male previously hospitalized for COVID pneumonia 9/14-9/19 then returned for seizure-like activity and subsequent cardiac arrest 10/7.  On chronic apixaban for afib. Starting CRRT 10/24 for AKI with worsening uremia.  Pharmacy consulted to convert apixaban to IV heparin.  Last dose of apixaban given 10/24 at 09:23 AM.    First HL > 2.2 as expected, falsely elevated d/t recent apixaban APTT 190 is above goal range  CBC: Hgb 14.4, Plt remain low/stable at 120k No bleeding or complications reported.   Goal of Therapy:  Heparin level 0.3-0.7 units/ml aPTT  66-102 seconds Monitor platelets by anticoagulation protocol: Yes   Plan:  HOLD heparin x1 hour (held 8-9am per RN) Reduce to heparin IV infusion at 1550 units/hr APTT 8 hours after rate change Daily heparin level and APTT -once both begin to correlate, will adjust heparin based on heparin levels only Continue to monitor H&H and platelets   Gretta Arab PharmD, BCPS Clinical pharmacist phone 7am- 5pm: [phone] 05/24/2019 8:51 AM

## 2019-05-24 NOTE — Progress Notes (Signed)
[facility] Progress Note   Called back to see pt on 05/20/19. He is a 68 year old WM with ICM- EF 40-45%, Afib, DM and also CKD (recent best crt baseline around 2). He was hosp at [facility] from 9/14-9/19- discharged, then readmitted 10/6 to [facility]- S/p cardiac arrest on 10/7. He suffered A on CRF in that setting with crt peaking at 5.17 on 10/10, then improved slowly - renal service signed off. Transferred to the ICU for hypoxia and intubated 10/21 and treated with remdesevir again -> just extubated 10/24. Assessment/ Plan:    Renal-Baseline CKD- crt 2- This is second event of A on CRF this hosp. Creat / Bun stable, no indication RRT yet. Per CCM feel by CT patient may be intravasc wet, IV lasix started 10/22. 2.0L/4.2L and  1L so far today. Diuresis held earlier today bec of rising BUN and steroids on hold as well for the same reason; no e/o bleed. CRRT started 10/24  - d/w Dr. [place] Bells and initiated CRRT given he was just extubated, e/o overload on CT scan and not tolerating diuresis with BUN steadily increasing to well over 100.  Seen on CRRT 4K baths pre/post/dialysate at 500/300/1500 Net uf 138ml/hr but not able to tolerate past few hours bec of hypotension   SP cardiac arrest - on 10/07  Atrial fib w/ RVR - on po metoprolol bid 12.5 for rate control  Hypotension - on pressors  COVID PNA/ underlying COPD- per CCM hopefully some of CT changes are due to vol excess, if not there will be not much hope for recovering  Hyperkalemia-Better  Hypernatremia - worse w/ diuresis  Anemia-not a significant issue   HTN- BP'son the softer side today, on pressors watch closely  VDRF - per CCM  CAD h/o CABG  DM2  Subjective:   Cough present but he states dyspnea improved c/w yesterday   Objective:   BP (!) 119/105   Pulse (!) 58   Temp 98.2 F (36.8 C) (Oral)   Resp (!) 21   Ht 5\' 11"  (1.803 m)   Wt 90 kg   SpO2 (!) 84%   BMI 27.67 kg/m    Intake/Output Summary (Last 24 hours) at 05/24/2019 1314 Last data filed at 05/24/2019 1200 Gross per 24 hour  Intake 202.42 ml  Output 2416 ml  Net -2213.58 ml   Weight change:   Physical Exam: Extubated with rhonchi and tachypneic No jvd Cor reg no RG Abd obese, soft nontender ? Ascites Ext no pretib edema Neuro- responsive  Imaging: Dg Chest Port 1 View  Result Date: 05/23/2019 CLINICAL DATA:  pneunmonia due to COVID-19. EXAM: PORTABLE CHEST 1 VIEW COMPARISON:  05/20/2019 FINDINGS: Stable position of ET tube, enteric tube, and right IJ catheter. Previous median sternotomy and CABG. Normal heart size. Interval improvement in bilateral interstitial and patchy airspace opacities. No pleural effusion identified. IMPRESSION: 1. Interval improvement in bilateral interstitial and airspace opacities compatible with resolving pneumonia. 2. Stable support apparatus. Electronically Signed   By: Kerby Moors M.D.   On: 05/23/2019 06:43    Labs: BMET Recent Labs  Lab 05/20/19 0452  05/20/19 1416 05/21/19 0510 05/21/19 0542 05/21/19 1615 05/21/19 1720 05/22/19 0400 05/23/19 0406 05/23/19 0550 05/23/19 1630 05/24/19 0534 05/24/19 0535  NA 144   < > 145 147* 144  --   --  148* 150* 151* 154* 145 147*  K 6.0*   < > 5.3* 4.9 4.7  --   --  3.9  3.5 3.4* 3.3* 3.7 3.8  CL 105  --  108 107  --   --   --  111  --  110 113* 107 109  CO2 25  --  24 23  --   --   --  23  --  26 28 27 28   GLUCOSE 293*  --  246* 135*  --   --   --  363*  --  287* 176* 130* 131*  BUN 103*  --  105* 107*  --   --   --  139*  --  169* 130* 97* 95*  CREATININE 3.25*  --  3.20* 3.30*  --   --   --  3.70*  --  4.07* 3.71* 2.18* 2.13*  CALCIUM 9.1  --  8.7* 9.1  --   --   --  8.3*  --  8.4* 8.1* 8.3* 8.3*  PHOS 7.0*  --   --   --   --  4.9* 5.2*  --   --   --   --   --  3.1   < > = values in this interval not displayed.   CBC Recent Labs  Lab 05/21/19 0510  05/22/19 0400 05/23/19 0406  05/23/19 0550 05/24/19 0534  WBC 14.6*  --  13.7*  --  14.1* 18.3*  HGB 13.7   < > 11.2* 12.6* 12.0* 14.4  HCT 45.2   < > 37.2* 37.0* 39.3 48.9  MCV 99.3  --  98.9  --  101.0* 101.9*  PLT 197  --  146*  --  112* 120*   < > = values in this interval not displayed.    Medications:    . calcitRIOL  0.25 mcg Oral Daily  . Chlorhexidine Gluconate Cloth  6 each Topical Daily  . diltiazem  30 mg Oral Q6H  . famotidine  20 mg Oral Daily  . insulin aspart  0-20 Units Subcutaneous Q4H  . insulin glargine  20 Units Subcutaneous QHS  . Ipratropium-Albuterol  1 puff Inhalation Q6H  . levETIRAcetam  500 mg Oral BID  . Melatonin  6 mg Oral QHS  . metoprolol tartrate  100 mg Oral BID  . mometasone-formoterol  2 puff Inhalation BID  . polyethylene glycol  17 g Oral BID  . senna  1 tablet Oral BID  . sodium chloride flush  3 mL Intravenous Q12H      Otelia Santee, MD 05/24/2019, 1:14 PM

## 2019-05-24 NOTE — Progress Notes (Signed)
Patient called spouse, Mardene Celeste, on personal cell phone while nurse was present. Patient spouse did not have any questions at present time. Will continue with POC.

## 2019-05-24 NOTE — Progress Notes (Signed)
Dylan Goodman  ACZ:[id number] DOB: 04-07-1951 DOA: May 15, 2019 PCP: Steele Sizer, MD    Brief Narrative:  68 year old with a history of atrial fibrillation, GIB, CAD status post CABG, nasopharyngeal cancer, CKD, DM, and GERD who was previously treated for Covid pneumonia 9/17 and returned to the [facility] ED with reported seizure-like behavior, and subsequently suffered a cardiac arrest.  He was intubated and transferred to [facility] ICU.  Significant Events: 9/14 > 9/19 admitted to the [facility] for Covid pneumonia 10/5 ED evaluation with chest pain -discharged home 10/6 ED evaluation for seizure -CT head negative for acute findings May 15, 2023 coded while in ED awaiting placement -intubated 10/8 extubated -transferred out of ICU 10/15 hypoxia requiring PCCM consultation 10/19 high resolution CT -cardiomegaly, small pleural effusions, extensive patchy consolidation from last opacities -moderate to severe emphysema 10/21 intubation -vasc cath placement -transfer to [facility] 10/24 extubated  COVID-19 specific Treatment: Remdesivir 10/20 > 10/24  Subjective: Significantly more alert today.  Successfully extubated 10/24.  Conversant.  Mildly confused.  Actually denies any complaints.  Assessment & Plan:  Acute hypoxic respiratory failure unclear etiology possible fibrosis versus edema -much improved at this time -successfully extubated 10/24 -presently stable on HFNC  Covid pneumonia September 2020 Has completed a second course of remdesivir - stopped Decadron given impressive uremia - not clinically convinced this represents recurrent or continued viral pneumonia  Uremia  steroids stopped -rapidly improving with utilization of CRRT  Recent Labs  Lab 05/22/19 0400 05/23/19 0550 05/23/19 1630 05/24/19 0534 05/24/19 0535  BUN 139* 169* 130* 97* 95*    Chronic atrial fibrillation with acute RVR chronically on Eliquis -transitioned to heparin drip while on CRRT -heart rate control  proving challenging and being further complicated by hypotension -if persists will consider short-term trial of amiodarone drip but given pulmonary issues do not wish to consider long-term amiodarone  Diffuse ST depression Noted on telemetry and confirmed with twelve-lead EKG -depression is deeper than time of previous EKG -likely rate related -cycle troponins  Mild systolic CHF TTE 10/9 noted EF 45-50% -volume management per CRRT -not grossly volume overloaded on physical exam  Seizure At presentation -questionably related to cardiac arrest -on Keppra for now -no evidence of recurrence  DM 2 CBG presently well controlled  Acute kidney injury on CKD Creatinine peaked at 5.17 10/10 -baseline creatinine 2 - Nephrology following -was reportedly being prepared for eventual onset of hemodialysis dependence as an outpatient -tolerating CRRT thus far  Recent Labs  Lab 05/22/19 0400 05/23/19 0550 05/23/19 1630 05/24/19 0534 05/24/19 0535  CREATININE 3.70* 4.07* 3.71* 2.18* 2.13*    Hyperkalemia > Hypokalemia  Corrected with initiation of CRRT  Mild transaminitis Possible mild shock liver versus effect of remdesivir versus right heart failure/congestion -LFTs are stable though very mildly elevated  Deconditioning Attempt to get up in the chair  DVT prophylaxis: Eliquis Code Status: FULL CODE Family Communication: Per PCCM Disposition Plan: ICU on CRRT  Consultants:  Neurology Cardiology Nephrology PCCM  Antimicrobials:  Zosyn 15-May-2023 > 10/11  Objective: Blood pressure 111/66, pulse (!) 31, temperature (!) 97.3 F (36.3 C), temperature source Axillary, resp. rate 18, height 5\' 11"  (1.803 m), weight 90 kg, SpO2 93 %.  Intake/Output Summary (Last 24 hours) at 05/24/2019 0817 Last data filed at 05/24/2019 0800 Gross per 24 hour  Intake 202.42 ml  Output 2917 ml  Net -2714.58 ml   Filed Weights   05/21/19 0500 05/22/19 0500 05/24/19 0500  Weight: 92.6 kg 93.1 kg 90  kg     Examination: General: Alert and conversant Lungs: Fine crackles diffusely Cardiovascular: Tachycardic and irregular Abdomen: soft, bowel sounds present, no rebound Extremities: No edema B LE   CBC: Recent Labs  Lab 05/22/19 0400 05/23/19 0406 05/23/19 0550 05/24/19 0534  WBC 13.7*  --  14.1* 18.3*  HGB 11.2* 12.6* 12.0* 14.4  HCT 37.2* 37.0* 39.3 48.9  MCV 98.9  --  101.0* 101.9*  PLT 146*  --  112* 154*   Basic Metabolic Panel: Recent Labs  Lab 05/21/19 1615 05/21/19 1720  05/23/19 1630 05/24/19 0534 05/24/19 0535  NA  --   --    < > 154* 145 147*  K  --   --    < > 3.3* 3.7 3.8  CL  --   --    < > 113* 107 109  CO2  --   --    < > 28 27 28   GLUCOSE  --   --    < > 176* 130* 131*  BUN  --   --    < > 130* 97* 95*  CREATININE  --   --    < > 3.71* 2.18* 2.13*  CALCIUM  --   --    < > 8.1* 8.3* 8.3*  MG 2.3 2.1  --   --  2.1  --   PHOS 4.9* 5.2*  --   --   --  3.1   < > = values in this interval not displayed.   GFR: Estimated Creatinine Clearance: 36.8 mL/min (A) (by C-G formula based on SCr of 2.13 mg/dL (H)).  Liver Function Tests: Recent Labs  Lab 05/21/19 0510 05/22/19 0400 05/23/19 0550 05/24/19 0534 05/24/19 0535  AST 46* 74* 75* 60*  --   ALT 105* 104* 122* 121*  --   ALKPHOS 149* 190* 202* 235*  --   BILITOT 1.6* 1.3* 1.1 2.0*  --   PROT 5.9* 4.8* 4.9* 5.8*  --   ALBUMIN 2.9* 2.4* 2.2* 2.6* 2.5*    HbA1C: Hgb A1c MFr Bld  Date/Time Value Ref Range Status  03/12/2019 07:14 AM 6.7 (H) 4.8 - 5.6 % Final    Comment:    (NOTE) Pre diabetes:          5.7%-6.4% Diabetes:              >6.4% Glycemic control for   <7.0% adults with diabetes   09/08/2018 12:03 PM 6.3 (H) <5.7 % of total Hgb Final    Comment:    For someone without known diabetes, a hemoglobin  A1c value between 5.7% and 6.4% is consistent with prediabetes and should be confirmed with a  follow-up test. . For someone with known diabetes, a value <7% indicates that their  diabetes is well controlled. A1c targets should be individualized based on duration of diabetes, age, comorbid conditions, and other considerations. . This assay result is consistent with an increased risk of diabetes. . Currently, no consensus exists regarding use of hemoglobin A1c for diagnosis of diabetes for children. .     CBG: Recent Labs  Lab 05/23/19 1356 05/23/19 1715 05/23/19 2012 05/23/19 2337 05/24/19 0347  GLUCAP 187* 152* 180* 161* 135*     Scheduled Meds: . calcitRIOL  0.25 mcg Per Tube Daily  . Chlorhexidine Gluconate Cloth  6 each Topical Daily  . diltiazem  30 mg Oral Q6H  . famotidine  20 mg Per Tube Daily  . insulin aspart  0-20  Units Subcutaneous Q4H  . insulin glargine  20 Units Subcutaneous QHS  . Ipratropium-Albuterol  1 puff Inhalation Q6H  . levETIRAcetam  500 mg Per Tube BID  . Melatonin  6 mg Per Tube QHS  . metoprolol tartrate  5 mg Intravenous Once  . metoprolol tartrate  100 mg Per Tube BID  . mometasone-formoterol  2 puff Inhalation BID  . polyethylene glycol  17 g Oral BID  . senna  1 tablet Oral BID  . sodium chloride flush  3 mL Intravenous Q12H     LOS: 18 days   Cherene Altes, MD [facility] Office  [phone] Pager - Text Page per Amion  If 7PM-7AM, please contact night-coverage per Amion 05/24/2019, 8:17 AM

## 2019-05-24 NOTE — Progress Notes (Signed)
CRITICAL VALUE ALERT  Critical Value:  Troponin   Date & Time Notied:  10/25 1820  Provider Notified: Burgess Estelle  Orders Received/Actions taken: none

## 2019-05-24 NOTE — Progress Notes (Signed)
NAME:  Dylan Goodman, MRN:  [id number], DOB:  Jun 28, 1951, LOS: 46 ADMISSION DATE:  May 25, 2019, CONSULTATION DATE:  May 25, 2023 REFERRING MD:  Reino Kent EDP, CHIEF COMPLAINT:  Cardiac arrest   Brief History   68 y/o male treated for COVID pneumonia on September 13 returned to the ED on 25-May-2023 with seizure and cardiac arrest.  Transitioned to floor where he had AKI which resolved but then later in his admission developed worsening respiratory failure and renal failure again.  Ultimately re-intubated on 10/21 and sent back to [facility].   Past Medical History  Systolic heart failure LVEF 40-45% Atrial fib GI bleed CAD Depression CKD DM2 Gastric ulcer GERD CABG Iron def anemia Skin cancer  Significant Hospital Events   9/14-9/19 admit to [facility] for COVID pneumonia.  10/05 to ED for Chest pain. Discharged home after negative cardiac. 10/06 To ED for seizure 05/25/2023 Coded in ED while awaiting placement 10/15 PCCM called back  10/17 Remains on 100%, 30L flow 10/20 placed back on remdesevir, when attempting to transfer to [facility] desaturated. Moved to ICU 10/21 renal fxn worse  10/24 extubated, started on CVVHD  Consults:  Neurology Cardiology Nephrology PCCM  Procedures:  ETT May 25, 2023 >> 10/8 ETT 10/21 >> 10/24 R IJ HD Cath 10/21 >>  Significant Diagnostic Tests:  Renal US 9/14 >> WNL Lower extremity doppler 2023/05/25 >> CT head 10/6 >> neg CXR 10/15 >> ILD, possible some volume overload Echo 10/9 >> LVEF 45-50%, global normal RV systolic function, LA severely dilated, RA moderately dilated, mild MVR, mild TR, moderately elevated pulmonary artery systolic pressure 84/13 CT chest images personally reviewed: underlying emphysema, bilateral effusion, cardiomegally, bilateral ground glass and consolidation  Micro Data:  05/25/23 blood > neg 10/8 resp > opf 05-25-2023 urine > neg  Antimicrobials/COVID RX  10/20 cefepime > 10/22 10/20 remdesivir >   Interim history/subjective:   Remains extubated  Objective   Blood pressure 111/66, pulse (!) 31, temperature (!) 97.3 F (36.3 C), temperature source Axillary, resp. rate 18, height 5\' 11"  (1.803 m), weight 90 kg, SpO2 93 %.        Intake/Output Summary (Last 24 hours) at 05/24/2019 2440 Last data filed at 05/24/2019 0800 Gross per 24 hour  Intake 202.42 ml  Output 2917 ml  Net -2714.58 ml   Filed Weights   05/21/19 0500 05/22/19 0500 05/24/19 0500  Weight: 92.6 kg 93.1 kg 90 kg    Examination:  General:  Resting comfortably in bed HENT: NCAT OP clear PULM: Rhonchi bilaterally B, normal effort CV: RRR, no mgr GI: BS+, soft, nontender MSK: normal bulk and tone Neuro: awake, alert, oriented to situation, MAEW   10/21 CXR > ETT in place, NG in place, CVL in good position, small bilateral effusions  Resolved Hospital Problem list     Assessment & Plan:  AKI: appears dry/euvolemic on physical exam but lungs look wet on CT chest from 10/19 (pleural effusions, pulmonary hypertension, interlobular septal thickening) Plan ultrafiltration with CVVHD through Wednesday of this week then move to [facility] for consideration of intermittent hemodialysis Appreciate renal input Remove foley cathter Monitor UOP  Acute respiratory failure with hypoxemia: significantly improved, unclear if pulmonary edema or inflammation, suspect more the former Continue to hold steroids Administer O2 to maintain O2 saturation > 85% Remove volume with CVVHD If hypoxemia worsens then restart steroids  Underlying emphysema Dulera bid combivent qid  Seizure: generalized tonic clonic seizure on admission, none since Discussed with neuro 10/25: Keppra 500mg  bid Outpatient neuro  follow up  Systolic heart failure: lungs appear wet to me on chest imaging Continue metoprolol  Atrial fib Tele Metoprolol Diltiazem Amiodarone   Best practice:  Diet: tube feeding Pain/Anxiety/Delirium protocol (if indicated): d/c today  VAP protocol (if indicated):  d/c DVT prophylaxis: eliquis GI prophylaxis: famotidine, renal dosing Glucose control: per TRH Mobility: bed rest Code Status: full Family Communication: I updated his wife Mardene Celeste by phone today Disposition: remain in ICU  Labs   CBC: Recent Labs  Lab 05/21/19 0510 05/21/19 0542 05/22/19 0400 05/23/19 0406 05/23/19 0550 05/24/19 0534  WBC 14.6*  --  13.7*  --  14.1* 18.3*  HGB 13.7 13.3 11.2* 12.6* 12.0* 14.4  HCT 45.2 39.0 37.2* 37.0* 39.3 48.9  MCV 99.3  --  98.9  --  101.0* 101.9*  PLT 197  --  146*  --  112* 120*    Basic Metabolic Panel: Recent Labs  Lab 05/19/19 0348 05/20/19 0452  05/21/19 1615 05/21/19 1720 05/22/19 0400 05/23/19 0406 05/23/19 0550 05/23/19 1630 05/24/19 0534 05/24/19 0535  NA 140 144   < >  --   --  148* 150* 151* 154* 145 147*  K 5.3* 6.0*   < >  --   --  3.9 3.5 3.4* 3.3* 3.7 3.8  CL 103 105   < >  --   --  111  --  110 113* 107 109  CO2 23 25   < >  --   --  23  --  26 28 27 28   GLUCOSE 235* 293*   < >  --   --  363*  --  287* 176* 130* 131*  BUN 96* 103*   < >  --   --  139*  --  169* 130* 97* 95*  CREATININE 2.75* 3.25*   < >  --   --  3.70*  --  4.07* 3.71* 2.18* 2.13*  CALCIUM 8.8* 9.1   < >  --   --  8.3*  --  8.4* 8.1* 8.3* 8.3*  MG 2.5* 2.4  --  2.3 2.1  --   --   --   --  2.1  --   PHOS  --  7.0*  --  4.9* 5.2*  --   --   --   --   --  3.1   < > = values in this interval not displayed.   GFR: Estimated Creatinine Clearance: 36.8 mL/min (A) (by C-G formula based on SCr of 2.13 mg/dL (H)). Recent Labs  Lab 05/19/19 0348 05/20/19 0452 05/21/19 0510 05/22/19 0400 05/23/19 0550 05/24/19 0534  PROCALCITON 0.32 0.34  --   --   --   --   WBC  --   --  14.6* 13.7* 14.1* 18.3*    Liver Function Tests: Recent Labs  Lab 05/20/19 0452 05/21/19 0510 05/22/19 0400 05/23/19 0550 05/24/19 0534 05/24/19 0535  AST 62* 46* 74* 75* 60*  --   ALT 123* 105* 104* 122* 121*  --   ALKPHOS 170* 149* 190* 202* 235*  --   BILITOT  2.0* 1.6* 1.3* 1.1 2.0*  --   PROT 6.0* 5.9* 4.8* 4.9* 5.8*  --   ALBUMIN 2.9* 2.9* 2.4* 2.2* 2.6* 2.5*   No results for input(s): LIPASE, AMYLASE in the last 168 hours. No results for input(s): AMMONIA in the last 168 hours.  ABG    Component Value Date/Time   PHART 7.473 (H) 05/23/2019 0406   PCO2ART  34.9 05/23/2019 0406   PO2ART 106.0 05/23/2019 0406   HCO3 25.7 05/23/2019 0406   TCO2 27 05/23/2019 0406   ACIDBASEDEF 2.9 (H) 05/14/2019 0625   O2SAT 99.0 05/23/2019 0406     Coagulation Profile: No results for input(s): INR, PROTIME in the last 168 hours.  Cardiac Enzymes: No results for input(s): CKTOTAL, CKMB, CKMBINDEX, TROPONINI in the last 168 hours.  HbA1C: Hgb A1c MFr Bld  Date/Time Value Ref Range Status  03/12/2019 07:14 AM 6.7 (H) 4.8 - 5.6 % Final    Comment:    (NOTE) Pre diabetes:          5.7%-6.4% Diabetes:              >6.4% Glycemic control for   <7.0% adults with diabetes   09/08/2018 12:03 PM 6.3 (H) <5.7 % of total Hgb Final    Comment:    For someone without known diabetes, a hemoglobin  A1c value between 5.7% and 6.4% is consistent with prediabetes and should be confirmed with a  follow-up test. . For someone with known diabetes, a value <7% indicates that their diabetes is well controlled. A1c targets should be individualized based on duration of diabetes, age, comorbid conditions, and other considerations. . This assay result is consistent with an increased risk of diabetes. . Currently, no consensus exists regarding use of hemoglobin A1c for diagnosis of diabetes for children. .     CBG: Recent Labs  Lab 05/23/19 1356 05/23/19 1715 05/23/19 2012 05/23/19 2337 05/24/19 0347  GLUCAP 187* 152* 180* 161* 135*       Critical care time: 35 minutes     Roselie Awkward, MD [facility] Pager: [phone] Cell: [phone] If no response, call [phone]

## 2019-05-25 DIAGNOSIS — J9601 Acute respiratory failure with hypoxia: Secondary | ICD-10-CM | POA: Diagnosis not present

## 2019-05-25 DIAGNOSIS — I4891 Unspecified atrial fibrillation: Secondary | ICD-10-CM | POA: Diagnosis not present

## 2019-05-25 DIAGNOSIS — U071 COVID-19: Secondary | ICD-10-CM | POA: Diagnosis not present

## 2019-05-25 DIAGNOSIS — N179 Acute kidney failure, unspecified: Secondary | ICD-10-CM | POA: Diagnosis not present

## 2019-05-25 LAB — APTT
aPTT: >200 seconds (ref 24–36)
aPTT: 78 seconds — ABNORMAL HIGH (ref 24–36)
aPTT: 60 seconds — ABNORMAL HIGH (ref 24–36)

## 2019-05-25 LAB — GLUCOSE, CAPILLARY
Glucose-Capillary: 120 mg/dL — ABNORMAL HIGH (ref 70–99)
Glucose-Capillary: 90 mg/dL (ref 70–99)
Glucose-Capillary: 64 mg/dL — ABNORMAL LOW (ref 70–99)
Glucose-Capillary: 96 mg/dL (ref 70–99)
Glucose-Capillary: 103 mg/dL — ABNORMAL HIGH (ref 70–99)
Glucose-Capillary: 165 mg/dL — ABNORMAL HIGH (ref 70–99)
Glucose-Capillary: 91 mg/dL (ref 70–99)

## 2019-05-25 LAB — RENAL FUNCTION PANEL
Albumin: 2.4 g/dL — ABNORMAL LOW (ref 3.5–5.0)
Albumin: 2.2 g/dL — ABNORMAL LOW (ref 3.5–5.0)
Anion gap: 10 (ref 5–15)
Anion gap: 7 (ref 5–15)
BUN: 51 mg/dL — ABNORMAL HIGH (ref 8–23)
BUN: 39 mg/dL — ABNORMAL HIGH (ref 8–23)
CO2: 25 mmol/L (ref 22–32)
CO2: 25 mmol/L (ref 22–32)
Calcium: 8.2 mg/dL — ABNORMAL LOW (ref 8.9–10.3)
Calcium: 7.7 mg/dL — ABNORMAL LOW (ref 8.9–10.3)
Chloride: 106 mmol/L (ref 98–111)
Chloride: 103 mmol/L (ref 98–111)
Creatinine, Ser: 1.63 mg/dL — ABNORMAL HIGH (ref 0.61–1.24)
Creatinine, Ser: 1.42 mg/dL — ABNORMAL HIGH (ref 0.61–1.24)
GFR calc Af Amer: 50 mL/min — ABNORMAL LOW (ref >60)
GFR calc Af Amer: 60 mL/min — ABNORMAL LOW (ref >60)
GFR calc non Af Amer: 44 mL/min — ABNORMAL LOW (ref >60)
GFR calc non Af Amer: 51 mL/min — ABNORMAL LOW (ref >60)
Glucose, Bld: 82 mg/dL (ref 70–99)
Glucose, Bld: 177 mg/dL — ABNORMAL HIGH (ref 70–99)
Phosphorus: 2.6 mg/dL (ref 2.5–4.6)
Phosphorus: 2.3 mg/dL — ABNORMAL LOW (ref 2.5–4.6)
Potassium: 4.2 mmol/L (ref 3.5–5.1)
Potassium: 4.3 mmol/L (ref 3.5–5.1)
Sodium: 141 mmol/L (ref 135–145)
Sodium: 135 mmol/L (ref 135–145)

## 2019-05-25 LAB — CBC
HCT: 42.3 % (ref 39.0–52.0)
Hemoglobin: 12.5 g/dL — ABNORMAL LOW (ref 13.0–17.0)
MCH: 30 pg (ref 26.0–34.0)
MCHC: 29.6 g/dL — ABNORMAL LOW (ref 30.0–36.0)
MCV: 101.7 fL — ABNORMAL HIGH (ref 80.0–100.0)
Platelets: 113 10*3/uL — ABNORMAL LOW (ref 150–400)
RBC: 4.16 MIL/uL — ABNORMAL LOW (ref 4.22–5.81)
RDW: 19.5 % — ABNORMAL HIGH (ref 11.5–15.5)
WBC: 12.1 10*3/uL — ABNORMAL HIGH (ref 4.0–10.5)
nRBC: 0.2 % (ref 0.0–0.2)

## 2019-05-25 LAB — HEPARIN LEVEL (UNFRACTIONATED): Heparin Unfractionated: 1.26 IU/mL — ABNORMAL HIGH (ref 0.30–0.70)

## 2019-05-25 LAB — MAGNESIUM: Magnesium: 2.2 mg/dL (ref 1.7–2.4)

## 2019-05-25 MED ORDER — INSULIN GLARGINE 100 UNIT/ML ~~LOC~~ SOLN
12.0000 [IU] | Freq: Every day | SUBCUTANEOUS | Status: DC
  Administered 2019-05-25 – 2019-06-04 (×11): 12 [IU] via SUBCUTANEOUS
  Filled 2019-05-25 (×13): qty 0.12

## 2019-05-25 MED ORDER — INSULIN ASPART 100 UNIT/ML ~~LOC~~ SOLN
0.0000 [IU] | Freq: Three times a day (TID) | SUBCUTANEOUS | Status: DC
  Administered 2019-05-26: 1 [IU] via SUBCUTANEOUS
  Administered 2019-05-27 (×2): 2 [IU] via SUBCUTANEOUS
  Administered 2019-05-27: 1 [IU] via SUBCUTANEOUS
  Administered 2019-05-28: 13:00:00 2 [IU] via SUBCUTANEOUS
  Administered 2019-05-28 – 2019-05-29 (×3): 1 [IU] via SUBCUTANEOUS
  Administered 2019-05-29 – 2019-05-30 (×3): 2 [IU] via SUBCUTANEOUS
  Administered 2019-05-30 – 2019-05-31 (×2): 1 [IU] via SUBCUTANEOUS
  Administered 2019-05-31: 3 [IU] via SUBCUTANEOUS
  Administered 2019-05-31 – 2019-06-02 (×2): 1 [IU] via SUBCUTANEOUS
  Administered 2019-06-02: 3 [IU] via SUBCUTANEOUS
  Administered 2019-06-02: 2 [IU] via SUBCUTANEOUS
  Administered 2019-06-03 – 2019-06-04 (×4): 1 [IU] via SUBCUTANEOUS

## 2019-05-25 MED ORDER — MIDODRINE HCL 5 MG PO TABS
5.0000 mg | ORAL_TABLET | Freq: Three times a day (TID) | ORAL | Status: DC
  Administered 2019-05-25 – 2019-05-26 (×4): 5 mg via ORAL
  Filled 2019-05-25 (×7): qty 1

## 2019-05-25 MED ORDER — ACETAMINOPHEN 325 MG PO TABS
650.0000 mg | ORAL_TABLET | ORAL | Status: DC | PRN
  Administered 2019-05-25 – 2019-06-05 (×6): 650 mg via ORAL
  Filled 2019-05-25 (×6): qty 2

## 2019-05-25 MED ORDER — PHENYLEPHRINE HCL-NACL 40-0.9 MG/250ML-% IV SOLN
0.0000 ug/min | INTRAVENOUS | Status: DC
  Administered 2019-05-25: 03:00:00 90 ug/min via INTRAVENOUS
  Administered 2019-05-25: 80 ug/min via INTRAVENOUS
  Administered 2019-05-25: 100 ug/min via INTRAVENOUS
  Administered 2019-05-25: 10:00:00 120 ug/min via INTRAVENOUS
  Administered 2019-05-26: 40 ug/min via INTRAVENOUS
  Administered 2019-05-27: 30 ug/min via INTRAVENOUS
  Administered 2019-05-27: 20 ug/min via INTRAVENOUS
  Filled 2019-05-25 (×7): qty 250

## 2019-05-25 MED ORDER — HEPARIN (PORCINE) 25000 UT/250ML-% IV SOLN
1700.0000 [IU]/h | INTRAVENOUS | Status: DC
  Administered 2019-05-25 (×2): 1350 [IU]/h via INTRAVENOUS
  Administered 2019-05-26: 1600 [IU]/h via INTRAVENOUS
  Administered 2019-05-27 – 2019-05-28 (×3): 1700 [IU]/h via INTRAVENOUS
  Filled 2019-05-25 (×5): qty 250

## 2019-05-25 MED ORDER — INSULIN ASPART 100 UNIT/ML ~~LOC~~ SOLN
0.0000 [IU] | Freq: Every day | SUBCUTANEOUS | Status: DC
  Administered 2019-05-29: 21:00:00 3 [IU] via SUBCUTANEOUS
  Administered 2019-06-02: 2 [IU] via SUBCUTANEOUS

## 2019-05-25 NOTE — Progress Notes (Signed)
ANTICOAGULATION CONSULT NOTE - Follow Up Consult  Pharmacy Consult for Heparin Indication: atrial fibrillation  No Known Allergies  Patient Measurements: Height: 5\' 11"  (180.3 cm) Weight: 192 lb 3.9 oz (87.2 kg) IBW/kg (Calculated) : 75.3 Heparin Dosing Weight: 92 kg  Vital Signs: Temp: 97.7 F (36.5 C) (10/26 1600) Temp Source: Axillary (10/26 1600) BP: 86/69 (10/26 1200) Pulse Rate: 94 (10/26 1000)  Labs: Recent Labs    05/23/19 0550  05/23/19 1943  05/24/19 0534 05/24/19 0535 05/24/19 1500 05/24/19 1630 05/24/19 1845 05/24/19 2220 05/25/19 0500 05/25/19 0930 05/25/19 1540  HGB 12.0*  --   --   --  14.4  --   --   --   --   --  12.5*  --   --   HCT 39.3  --   --   --  48.9  --   --   --   --   --  42.3  --   --   PLT 112*  --   --   --  120*  --   --   --   --   --  113*  --   --   APTT  --   --   --    < >  --  190*  --  32  --  >200*  --  78* 60*  HEPARINUNFRC  --   --  >2.20*  --   --  >2.20*  --   --   --   --   --  1.26*  --   CREATININE 4.07*   < >  --   --  2.18* 2.13* 1.68*  --   --   --  1.63*  --  1.42*  TROPONINIHS  --   --   --   --   --   --   --  261* 774*  --   --   --   --    < > = values in this interval not displayed.    Estimated Creatinine Clearance: 55.2 mL/min (A) (by C-G formula based on SCr of 1.42 mg/dL (H)).   Medications:  Infusions:  .  prismasol BGK 4/2.5 400 mL/hr at 05/25/19 1550  .  prismasol BGK 4/2.5 200 mL/hr at 05/25/19 1550  . sodium chloride 250 mL (05/23/19 0151)  . heparin 1,350 Units/hr (05/25/19 1700)  . phenylephrine (NEO-SYNEPHRINE) Adult infusion 100 mcg/min (05/25/19 1700)  . prismasol BGK 4/2.5 1,500 mL/hr at 05/25/19 1705    Assessment: 68 yo male previously hospitalized for COVID pneumonia 9/14-9/19 then returned for seizure-like activity and subsequent cardiac arrest 10/7.  On chronic apixaban for afib. Starting CRRT 10/24 for AKI with worsening uremia.  Pharmacy consulted to convert apixaban to IV heparin.   Last dose of apixaban given 10/24 at 09:23 AM.    Of note, patient's aPTT was elevated last night off of a central blood draw. Heparin was held and resumed at a lower rate. A repeat peripheral stick this AM resulted in a therapeutic aPTT of 78 on 1350 units/hr. Of note, patient has some tea colored urine but no overt s/s of bleeding   A repeat of the PTT this PM came back at 60, which is below goal. We will increase the rate and recheck with a peripheral stick. Heparin level is not correlating with PTT just yet.    Goal of Therapy:  Heparin level 0.3-0.7 units/ml aPTT 66-102 seconds Monitor platelets by anticoagulation protocol: Yes  Plan:  Increase heparin to 1450 units/hr F/u aPTT in 6 hours  F/u HL in AM  Onnie Boer, PharmD, [place], AAHIVP, CPP Infectious Disease Pharmacist 05/25/2019 5:16 PM

## 2019-05-25 NOTE — Progress Notes (Signed)
[facility]  I called and updated the patient's wife Dylan Goodman this evening.  Questions answered.  Roselie Awkward, MD [facility] Pager: [phone] Cell: [phone] If no response, call [phone]

## 2019-05-25 NOTE — Progress Notes (Signed)
Pt faced time wife via his cell phone. RN also spoke with wife via face time. I was able to update her and address her concerns and answered her questions.

## 2019-05-25 NOTE — Evaluation (Signed)
Clinical/Bedside Swallow Evaluation Patient Details  Name: Mervil Wacker MRN: [id number] Date of Birth: 1950/10/17  Today's Date: 05/25/2019 Time: SLP Start Time (ACUTE ONLY): 2 SLP Stop Time (ACUTE ONLY): 0954 SLP Time Calculation (min) (ACUTE ONLY): 19 min  Past Medical History:  Past Medical History:  Diagnosis Date  . Atrial fibrillation ([place])   . Bleeding gastrointestinal   . CAD (coronary artery disease)    Status post CABG and PCI  . 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 Cardiomyopathy ([place])   . Chronic depression   . CKD (chronic kidney disease)   . Diabetes mellitus ([place])   . Gastric ulcer with hemorrhage   . Gastritis   . GERD (gastroesophageal reflux disease)   . Hx of CABG 12/1994  . IDA (iron deficiency anemia)   . 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 Myocardial infarction ([place]) 2017  . Personal history of radiation therapy 05/30/2018   39 treatments with chemotherapy nasopharyngeal cancer  . 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 CAROTID ENDARTERECTOMY  2006  . COLONOSCOPY WITH PROPOFOL N/A 03/11/2019   Procedure: COLONOSCOPY WITH PROPOFOL;  Surgeon: Toledo, Benay Pike, MD;  Location: [facility] ENDOSCOPY;  Service: Gastroenterology;  Laterality: N/A;  . CORONARY ARTERY BYPASS GRAFT  1996   quadruple bypass  . ESOPHAGOGASTRODUODENOSCOPY (EGD) WITH PROPOFOL N/A 03/11/2019   Procedure: ESOPHAGOGASTRODUODENOSCOPY (EGD) WITH PROPOFOL;  Surgeon: Toledo, Benay Pike, MD;  Location: [facility] ENDOSCOPY;  Service: Gastroenterology;  Laterality: N/A;  . FEMORAL-POPLITEAL BYPASS GRAFT Right 06/1998   11/2001, 08/2003  . 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 LOWER EXTREMITY ANGIOGRAPHY Left 11/11/2018   Procedure: LOWER EXTREMITY ANGIOGRAPHY;  Surgeon: Katha Cabal, MD;  Location: [facility];  Service: Cardiovascular;   Laterality: Left;  . REVISION OF AORTA BIFEMORAL BYPASS    . Stent revascularization left leg  08/2009  . Stent revascularization right leg  07/2009   HPI:  68 year old with a history of atrial fibrillation, GIB, CAD status post CABG, nasopharyngeal cancer, CKD, DM, and GERD who was previously treated for Covid pneumonia and admitted 9/14-9/17. Returned to the [facility] ED with reported seizure-like behavior 10/6, and subsequently suffered a cardiac arrest.  He was intubated 10/7-8, transferred to [facility] ICU. 10/15 with hypoxia, respiratory failure and renal failure again requiring reintubation and transfer to [facility] 10/21, extubated 10/24. On CVVHD.    Assessment / Plan / Recommendation Clinical Impression  Pt presents with overall functional swallowing.  Oral mechanism exam is unremarkable.  Voice is strong/clear with no indication of glottal insufficiency.  Cough is strong.  RR within adequate parameters to suggest good coordination of swallow/respiration.  Pt consumed three oz water without incident.  He consumed soft solids with functional mastication, brisk swallow response, There were no s/s of aspiration throughout assessment.  Recommend initiating a regular consistency diet, thin liquids, give meds whole in liquid.  SLP will f/u x1 to ensure safety/toleration. D/W pt, RN.  SLP Visit Diagnosis: Dysphagia, unspecified (R13.10)    Aspiration Risk       Diet Recommendation   carb modified, thin liquids  Medication Administration: Whole meds with liquid    Other  Recommendations Oral Care Recommendations: Oral care BID   Follow up Recommendations None      Frequency and Duration min 1 x/week  1 week  Prognosis        Swallow Study   General HPI: 68 year old with a history of atrial fibrillation, GIB, CAD status post CABG, nasopharyngeal cancer, CKD, DM, and GERD who was previously treated for Covid pneumonia and admitted 9/14-9/17. Returned to the [facility] ED with reported  seizure-like behavior 10/6, and subsequently suffered a cardiac arrest.  He was intubated 10/7-8, transferred to [facility] ICU. 10/15 with hypoxia, respiratory failure and renal failure again requiring reintubation and transfer to [facility] 10/21, extubated 10/24. On CVVHD.  Type of Study: Bedside Swallow Evaluation Previous Swallow Assessment: no Diet Prior to this Study: Thin liquids Temperature Spikes Noted: No Respiratory Status: Nasal cannula(2 liters) History of Recent Intubation: Yes Length of Intubations (days): 3 days Date extubated: 05/23/19 Behavior/Cognition: Alert;Cooperative;Pleasant mood Oral Cavity Assessment: Within Functional Limits Oral Care Completed by SLP: Recent completion by staff Oral Cavity - Dentition: Adequate natural dentition Vision: Functional for self-feeding Self-Feeding Abilities: Able to feed self;Needs set up Patient Positioning: Upright in bed Baseline Vocal Quality: Normal Volitional Cough: Strong Volitional Swallow: Able to elicit    Oral/Motor/Sensory Function Overall Oral Motor/Sensory Function: Within functional limits   Ice Chips Ice chips: Not tested   Thin Liquid Thin Liquid: Within functional limits    Nectar Thick Nectar Thick Liquid: Not tested   Honey Thick Honey Thick Liquid: Not tested   Puree Puree: Within functional limits   Solid     Solid: Within functional limits      [name] 05/25/2019,9:55 AM Estill Bamberg L. Tivis Ringer, [place] Office number [phone]

## 2019-05-25 NOTE — Evaluation (Signed)
Physical Therapy RE-Evaluation Patient Details Name: Dylan Goodman MRN: [id number] DOB: Aug 12, 1950 Today's Date: 05/25/2019   History of Present Illness  68 y/o male treated for COVID pneumonia on September 13 returned to the ED on 10/7 with seizure and cardiac arrest.  Transitioned to floor where he had AKI which resolved but then later in his admission developed worsening respiratory failure and renal failure again.  Ultimately re-intubated on 10/21 and sent back to [facility]. Extubated 10/24. On CRRT  Clinical Impression  The patient cheerful, joking with therapists. Assisted patient to sit and stand at the bedside. Patient on CRRT. SPo2 >94%, BP sitting 93/56. HR 101. Patient  Will be a good CIR candidate eventually.  Continue PT for mobility and endurance.    Follow Up Recommendations CIR    Equipment Recommendations  None recommended by PT    Recommendations for Other Services Rehab consult     Precautions / Restrictions Precautions Precautions: Fall Precaution Comments: watch O2, CRRT patient Restrictions Weight Bearing Restrictions: No      Mobility  Bed Mobility Overal bed mobility: Needs Assistance Bed Mobility: Supine to Sit;Sit to Supine     Supine to sit: Max assist Sit to supine: Max assist   General bed mobility comments: max A guide BLEs EOB and support at trunk  Transfers Overall transfer level: Needs assistance Equipment used: 2 person hand held assist Transfers: Sit to/from Stand Sit to Stand: Max assist         General transfer comment: max A to rise and steady to change linens on bed  Ambulation/Gait                Stairs            Wheelchair Mobility    Modified Rankin (Stroke Patients Only)       Balance Overall balance assessment: Needs assistance Sitting-balance support: Feet supported;Bilateral upper extremity supported Sitting balance-Leahy Scale: Fair Sitting balance - Comments: able to sit EoB with close  monitor of lines, HHA   Standing balance support: Bilateral upper extremity supported Standing balance-Leahy Scale: Zero                               Pertinent Vitals/Pain Pain Assessment: Faces Faces Pain Scale: Hurts a little bit Pain Location: generalized with mobility Pain Descriptors / Indicators: Sore Pain Intervention(s): Limited activity within patient's tolerance;Monitored during session;Repositioned    Home Living Family/patient expects to be discharged to:: Private residence Living Arrangements: Spouse/significant other;Children Available Help at Discharge: Family;Available 24 hours/day Type of Home: House Home Access: Level entry     Home Layout: Two level;Bed/bath upstairs Home Equipment: Grab bars - toilet;Walker - 2 wheels      Prior Function Level of Independence: Independent         Comments: reports dizzy spells, only used RW to walk to ambulate, normally does not use     Hand Dominance        Extremity/Trunk Assessment                Communication      Cognition Arousal/Alertness: Awake/alert Behavior During Therapy: WFL for tasks assessed/performed Overall Cognitive Status: Within Functional Limits for tasks assessed                                 General Comments: able to recall therapists from previous sessions  General Comments      Exercises     Assessment/Plan    PT Assessment    PT Problem List         PT Treatment Interventions      PT Goals (Current goals can be found in the Care Plan section)  Acute Rehab PT Goals Patient Stated Goal: to get back home PT Goal Formulation: With patient Time For Goal Achievement: 06/08/19 Potential to Achieve Goals: Fair    Frequency Min 3X/week   Barriers to discharge        Co-evaluation PT/OT/SLP Co-Evaluation/Treatment: Yes Reason for Co-Treatment: Complexity of the patient's impairments (multi-system involvement);For  patient/therapist safety;To address functional/ADL transfers PT goals addressed during session: Mobility/safety with mobility OT goals addressed during session: ADL's and self-care       AM-PAC PT "6 Clicks" Mobility  Outcome Measure Help needed turning from your back to your side while in a flat bed without using bedrails?: A Lot Help needed moving from lying on your back to sitting on the side of a flat bed without using bedrails?: A Lot Help needed moving to and from a bed to a chair (including a wheelchair)?: Total Help needed standing up from a chair using your arms (e.g., wheelchair or bedside chair)?: Total Help needed to walk in hospital room?: Total Help needed climbing 3-5 steps with a railing? : Total 6 Click Score: 8    End of Session Equipment Utilized During Treatment: Oxygen Activity Tolerance: Patient limited by fatigue Patient left: in bed;with call bell/phone within reach;with bed alarm set;with nursing/sitter in room Nurse Communication: Mobility status PT Visit Diagnosis: Unsteadiness on feet (R26.81);Other abnormalities of gait and mobility (R26.89);Muscle weakness (generalized) (M62.81);Difficulty in walking, not elsewhere classified (R26.2)    Time: 1020-1045 PT Time Calculation (min) (ACUTE ONLY): 25 min   Charges:   PT Evaluation $PT Re-evaluation: 1 Re-eval          Tresa Endo PT Acute Rehabilitation Services Pager [phone] Office [phone]   Claretha Cooper 05/25/2019, 2:49 PM

## 2019-05-25 NOTE — Progress Notes (Signed)
NAME:  Dylan Goodman, MRN:  [id number], DOB:  12/12/1950, LOS: 78 ADMISSION DATE:  05-28-2019, CONSULTATION DATE:  05-28-23 REFERRING MD:  Reino Kent EDP, CHIEF COMPLAINT:  Cardiac arrest   Brief History   68 y/o male treated for COVID pneumonia on September 13 returned to the ED on 28-May-2023 with seizure and cardiac arrest.  Transitioned to floor where he had AKI which resolved but then later in his admission developed worsening respiratory failure and renal failure again.  Ultimately re-intubated on 10/21 and sent back to [facility].   Past Medical History  Systolic heart failure LVEF 40-45% Atrial fib GI bleed CAD Depression CKD DM2 Gastric ulcer GERD CABG Iron def anemia Skin cancer  Significant Hospital Events   9/14-9/19 admit to [facility] for COVID pneumonia.  10/05 to ED for Chest pain. Discharged home after negative cardiac. 10/06 To ED for seizure 05-28-23 Coded in ED while awaiting placement 10/15 PCCM called back  10/17 Remains on 100%, 30L flow 10/20 placed back on remdesevir, when attempting to transfer to [facility] desaturated. Moved to ICU 10/21 renal fxn worse  10/24 extubated, started on CVVHD  Consults:  Neurology Cardiology Nephrology PCCM  Procedures:  ETT 05/28/23 >> 10/8 ETT 10/21 >> 10/24 R IJ HD Cath 10/21 >>  Significant Diagnostic Tests:  Renal US 9/14 >> WNL Lower extremity doppler 2023-05-28 >> CT head 10/6 >> neg CXR 10/15 >> ILD, possible some volume overload Echo 10/9 >> LVEF 45-50%, global normal RV systolic function, LA severely dilated, RA moderately dilated, mild MVR, mild TR, moderately elevated pulmonary artery systolic pressure 72/53 CT chest images personally reviewed: underlying emphysema, bilateral effusion, cardiomegally, bilateral ground glass and consolidation  Micro Data:  May 28, 2023 blood > neg 10/8 resp > opf 28-May-2023 urine > neg  Antimicrobials/COVID RX  10/20 cefepime > 10/22 10/20 remdesivir >   Interim history/subjective:   Voice stronger  Feels better Making minimal urine On 163mcg/min neosynephrine today  Objective   Blood pressure (!) 86/69, pulse 94, temperature (!) 97.5 F (36.4 C), temperature source Axillary, resp. rate 16, height 5\' 11"  (1.803 m), weight 87.2 kg, SpO2 96 %.        Intake/Output Summary (Last 24 hours) at 05/25/2019 1356 Last data filed at 05/25/2019 1330 Gross per 24 hour  Intake 3167.47 ml  Output 2769 ml  Net 398.47 ml   Filed Weights   05/22/19 0500 05/24/19 0500 05/25/19 0500  Weight: 93.1 kg 90 kg 87.2 kg    Examination:  General:  Resting comfortably in bed HENT: NCAT OP clear PULM: CTA B, normal effort CV: RRR, no mgr GI: BS+, soft, nontender MSK: normal bulk and tone Neuro: awake, alert, no distress, MAEW  10/21 CXR > ETT in place, NG in place, CVL in good position, small bilateral effusions  Resolved Hospital Problem list     Assessment & Plan:  AKI: appears dry/euvolemic on physical exam but lungs look wet on CT chest from 10/19 (pleural effusions, pulmonary hypertension, interlobular septal thickening) Initial plan from renal yesterday was to continue ultrafiltration through Wednesday with CVVHD, then consider transfer to [facility] for intermittent HD later in the week Given hypotension in last 24 hours, would favor very gentle ultrafiltration Monitor UOP  Acute respiratory failure with hypoxemia: significantly improved, unclear if pulmonary edema or inflammation, suspect more the former Hold steroids Administer O2 to maintain O2 saturation > 85% If hypoxemia worsens restart steroids again  Underlying emphysema Dulera bid combivent qid  Seizure: generalized tonic clonic seizure  on admission, none since Discussed with neuro 10/25, they recommend continued treatment Keppra 500mg  bid Outpatient neuro follow up  Systolic heart failure: lungs appear wet to me on chest imaging Continue metoprolol  Hypotension in setting of systolic heart failure, multiple drugs for  rate control Add midodrine Continue neosynephrine titrated for MAP > 55, SBP > 90  Atrial fib Tele Metoprolol Diltiazem Amiodarone  Best practice:  Diet: tube feeding Pain/Anxiety/Delirium protocol (if indicated): d/c today  VAP protocol (if indicated): d/c DVT prophylaxis: eliquis GI prophylaxis: famotidine, renal dosing Glucose control: per TRH Mobility: bed rest Code Status: full Family Communication: will discuss who will call family with TRH Disposition: remain in ICU  Labs   CBC: Recent Labs  Lab 05/21/19 0510  05/22/19 0400 05/23/19 0406 05/23/19 0550 05/24/19 0534 05/25/19 0500  WBC 14.6*  --  13.7*  --  14.1* 18.3* 12.1*  HGB 13.7   < > 11.2* 12.6* 12.0* 14.4 12.5*  HCT 45.2   < > 37.2* 37.0* 39.3 48.9 42.3  MCV 99.3  --  98.9  --  101.0* 101.9* 101.7*  PLT 197  --  146*  --  112* 120* 113*   < > = values in this interval not displayed.    Basic Metabolic Panel: Recent Labs  Lab 05/20/19 0452  05/21/19 1615 05/21/19 1720  05/23/19 1630 05/24/19 0534 05/24/19 0535 05/24/19 1500 05/25/19 0500  NA 144   < >  --   --    < > 154* 145 147* 142 141  K 6.0*   < >  --   --    < > 3.3* 3.7 3.8 3.8 4.2  CL 105   < >  --   --    < > 113* 107 109 107 106  CO2 25   < >  --   --    < > 28 27 28 26 25   GLUCOSE 293*   < >  --   --    < > 176* 130* 131* 115* 82  BUN 103*   < >  --   --    < > 130* 97* 95* 68* 51*  CREATININE 3.25*   < >  --   --    < > 3.71* 2.18* 2.13* 1.68* 1.63*  CALCIUM 9.1   < >  --   --    < > 8.1* 8.3* 8.3* 7.7* 8.2*  MG 2.4  --  2.3 2.1  --   --  2.1  --   --  2.2  PHOS 7.0*  --  4.9* 5.2*  --   --   --  3.1 3.1 2.6   < > = values in this interval not displayed.   GFR: Estimated Creatinine Clearance: 48.1 mL/min (A) (by C-G formula based on SCr of 1.63 mg/dL (H)). Recent Labs  Lab 05/19/19 0348 05/20/19 0452  05/22/19 0400 05/23/19 0550 05/24/19 0534 05/25/19 0500  PROCALCITON 0.32 0.34  --   --   --   --   --   WBC  --   --     < > 13.7* 14.1* 18.3* 12.1*   < > = values in this interval not displayed.    Liver Function Tests: Recent Labs  Lab 05/20/19 0452 05/21/19 0510 05/22/19 0400 05/23/19 0550 05/24/19 0534 05/24/19 0535 05/24/19 1500 05/25/19 0500  AST 62* 46* 74* 75* 60*  --   --   --   ALT 123* 105* 104*  122* 121*  --   --   --   ALKPHOS 170* 149* 190* 202* 235*  --   --   --   BILITOT 2.0* 1.6* 1.3* 1.1 2.0*  --   --   --   PROT 6.0* 5.9* 4.8* 4.9* 5.8*  --   --   --   ALBUMIN 2.9* 2.9* 2.4* 2.2* 2.6* 2.5* 2.4* 2.4*   No results for input(s): LIPASE, AMYLASE in the last 168 hours. No results for input(s): AMMONIA in the last 168 hours.  ABG    Component Value Date/Time   PHART 7.473 (H) 05/23/2019 0406   PCO2ART 34.9 05/23/2019 0406   PO2ART 106.0 05/23/2019 0406   HCO3 25.7 05/23/2019 0406   TCO2 27 05/23/2019 0406   ACIDBASEDEF 2.9 (H) 05/14/2019 0625   O2SAT 99.0 05/23/2019 0406     Coagulation Profile: No results for input(s): INR, PROTIME in the last 168 hours.  Cardiac Enzymes: No results for input(s): CKTOTAL, CKMB, CKMBINDEX, TROPONINI in the last 168 hours.  HbA1C: Hgb A1c MFr Bld  Date/Time Value Ref Range Status  03/12/2019 07:14 AM 6.7 (H) 4.8 - 5.6 % Final    Comment:    (NOTE) Pre diabetes:          5.7%-6.4% Diabetes:              >6.4% Glycemic control for   <7.0% adults with diabetes   09/08/2018 12:03 PM 6.3 (H) <5.7 % of total Hgb Final    Comment:    For someone without known diabetes, a hemoglobin  A1c value between 5.7% and 6.4% is consistent with prediabetes and should be confirmed with a  follow-up test. . For someone with known diabetes, a value <7% indicates that their diabetes is well controlled. A1c targets should be individualized based on duration of diabetes, age, comorbid conditions, and other considerations. . This assay result is consistent with an increased risk of diabetes. . Currently, no consensus exists regarding use of  hemoglobin A1c for diagnosis of diabetes for children. .     CBG: Recent Labs  Lab 05/23/19 2337 05/24/19 0347 05/24/19 0830 05/24/19 1249 05/24/19 2002  GLUCAP 161* 135* 122* 105* 113*       Critical care time: 31 minutes     Roselie Awkward, MD [place] Pager: [phone] Cell: [phone] If no response, call [phone]

## 2019-05-25 NOTE — Progress Notes (Signed)
Dylan Goodman  KAJ:[id number] DOB: 10/25/1950 DOA: May 14, 2019 PCP: Steele Sizer, MD    Brief Narrative:  68 year old with a history of atrial fibrillation, GIB, CAD status post CABG, nasopharyngeal cancer, CKD, DM, and GERD who was previously treated for Covid pneumonia 9/17 and returned to the [facility] ED with reported seizure-like behavior, and subsequently suffered a cardiac arrest.  He was intubated and transferred to [facility] ICU.  Significant Events: 9/14 > 9/19 admitted to the [facility] for Covid pneumonia 10/5 ED evaluation with chest pain -discharged home 10/6 ED evaluation for seizure -CT head negative for acute findings 2023/05/14 coded while in ED awaiting placement -intubated 10/8 extubated -transferred out of ICU 10/15 hypoxia requiring PCCM consultation 10/19 high resolution CT -cardiomegaly, small pleural effusions, extensive patchy consolidation from last opacities -moderate to severe emphysema 10/21 intubation -vasc cath placement -transfer to [facility] 10/24 extubated  COVID-19 specific Treatment: Remdesivir 10/20 > 10/24  Subjective: Much more alert and now fully oriented. Improving rapidly. Denies cp, sob, n/v, or abdom pain.   Assessment & Plan:  Acute hypoxic respiratory failure - unclear etiology possible fibrosis versus edema - successfully extubated 10/24 - now stable on conventional nasal cannula O2 only   Covid pneumonia September 2020 Has completed a second course of remdesivir - stopped Decadron given impressive uremia - not clinically convinced this represents recurrent or continued viral pneumonia - stable at this time w/o specific tx otherwise   Uremia  steroids stopped - rapidly improving with utilization of CRRT, w/ concomitant improvement in clinical condition and mental status   Recent Labs  Lab 05/23/19 1630 05/24/19 0534 05/24/19 0535 05/24/19 1500 05/25/19 0500  BUN 130* 97* 95* 68* 51*    Chronic atrial fibrillation with acute RVR  chronically on Eliquis - transitioned to heparin drip while on CRRT - heart rate control proving challenging and being further complicated by hypotension - if persists will consider short-term trial of amiodarone drip but given pulmonary issues do not wish to consider long-term amiodarone - midodrine added today for BP support   Diffuse ST depression Noted on telemetry and confirmed with twelve-lead EKG -depression is deeper than time of previous EKG -likely rate related - troponins elevated, perhaps due to tachy itself - will need cards eval once stabilized, perhaps as outpt - f/u EKG in AM for comparison   Mild systolic CHF TTE 03/5 noted EF 45-50% -volume management per CRRT -not grossly volume overloaded on physical exam  Seizure At presentation -questionably related to cardiac arrest -on Keppra for now -no evidence of recurrence - cont Keppra for now as per Neuro recs   DM 2 CBG controlled  Acute kidney injury on CKD Creatinine peaked at 5.17 10/10 -baseline creatinine 2 - Nephrology following -was reportedly being prepared for eventual onset of hemodialysis dependence as an outpatient -tolerating CRRT thus far  Recent Labs  Lab 05/23/19 1630 05/24/19 0534 05/24/19 0535 05/24/19 1500 05/25/19 0500  CREATININE 3.71* 2.18* 2.13* 1.68* 1.63*    Hyperkalemia > Hypokalemia  Corrected with initiation of CRRT  Mild transaminitis Possible mild shock liver versus effect of remdesivir versus right heart failure/congestion -LFTs are stable though very mildly elevated  Deconditioning Attempt to get up in the chair  Macrocytosis Check B12 and folate  DVT prophylaxis: Eliquis Code Status: FULL CODE Family Communication: Per PCCM Disposition Plan: ICU on CRRT  Consultants:  Neurology Cardiology Nephrology PCCM  Antimicrobials:  Zosyn 05-14-2023 > 10/11  Objective: Blood pressure (!) 86/69, pulse 94, temperature (!)  97.5 F (36.4 C), temperature source Axillary, resp. rate 16,  height 5\' 11"  (1.803 m), weight 87.2 kg, SpO2 96 %.  Intake/Output Summary (Last 24 hours) at 05/25/2019 1555 Last data filed at 05/25/2019 1500 Gross per 24 hour  Intake 3284.63 ml  Output 2988 ml  Net 296.63 ml   Filed Weights   05/22/19 0500 05/24/19 0500 05/25/19 0500  Weight: 93.1 kg 90 kg 87.2 kg    Examination: General: Alert and conversant - no distress Lungs: Fine crackles diffusely w/o wheezing  Cardiovascular: Tachycardic - irregular - no M or rub  Abdomen: soft, BS+, no rebound Extremities: no edema B LE   CBC: Recent Labs  Lab 05/23/19 0550 05/24/19 0534 05/25/19 0500  WBC 14.1* 18.3* 12.1*  HGB 12.0* 14.4 12.5*  HCT 39.3 48.9 42.3  MCV 101.0* 101.9* 101.7*  PLT 112* 120* 786*   Basic Metabolic Panel: Recent Labs  Lab 05/21/19 1720  05/24/19 0534 05/24/19 0535 05/24/19 1500 05/25/19 0500  NA  --    < > 145 147* 142 141  K  --    < > 3.7 3.8 3.8 4.2  CL  --    < > 107 109 107 106  CO2  --    < > 27 28 26 25   GLUCOSE  --    < > 130* 131* 115* 82  BUN  --    < > 97* 95* 68* 51*  CREATININE  --    < > 2.18* 2.13* 1.68* 1.63*  CALCIUM  --    < > 8.3* 8.3* 7.7* 8.2*  MG 2.1  --  2.1  --   --  2.2  PHOS 5.2*  --   --  3.1 3.1 2.6   < > = values in this interval not displayed.   GFR: Estimated Creatinine Clearance: 48.1 mL/min (A) (by C-G formula based on SCr of 1.63 mg/dL (H)).  Liver Function Tests: Recent Labs  Lab 05/21/19 0510 05/22/19 0400 05/23/19 0550 05/24/19 0534 05/24/19 0535 05/24/19 1500 05/25/19 0500  AST 46* 74* 75* 60*  --   --   --   ALT 105* 104* 122* 121*  --   --   --   ALKPHOS 149* 190* 202* 235*  --   --   --   BILITOT 1.6* 1.3* 1.1 2.0*  --   --   --   PROT 5.9* 4.8* 4.9* 5.8*  --   --   --   ALBUMIN 2.9* 2.4* 2.2* 2.6* 2.5* 2.4* 2.4*    HbA1C: Hgb A1c MFr Bld  Date/Time Value Ref Range Status  03/12/2019 07:14 AM 6.7 (H) 4.8 - 5.6 % Final    Comment:    (NOTE) Pre diabetes:          5.7%-6.4% Diabetes:               >6.4% Glycemic control for   <7.0% adults with diabetes   09/08/2018 12:03 PM 6.3 (H) <5.7 % of total Hgb Final    Comment:    For someone without known diabetes, a hemoglobin  A1c value between 5.7% and 6.4% is consistent with prediabetes and should be confirmed with a  follow-up test. . For someone with known diabetes, a value <7% indicates that their diabetes is well controlled. A1c targets should be individualized based on duration of diabetes, age, comorbid conditions, and other considerations. . This assay result is consistent with an increased risk of diabetes. . Currently, no consensus  exists regarding use of hemoglobin A1c for diagnosis of diabetes for children. .     CBG: Recent Labs  Lab 05/24/19 2310 05/25/19 0502 05/25/19 0815 05/25/19 0947 05/25/19 1152  GLUCAP 120* 90 64* 96 103*     Scheduled Meds: . calcitRIOL  0.25 mcg Oral Daily  . Chlorhexidine Gluconate Cloth  6 each Topical Daily  . diltiazem  30 mg Oral Q6H  . insulin aspart  0-20 Units Subcutaneous Q4H  . insulin glargine  20 Units Subcutaneous QHS  . Ipratropium-Albuterol  1 puff Inhalation Q6H  . levETIRAcetam  500 mg Oral BID  . Melatonin  6 mg Oral QHS  . metoprolol tartrate  100 mg Oral BID  . midodrine  5 mg Oral Q8H  . mometasone-formoterol  2 puff Inhalation BID  . pantoprazole  40 mg Oral Daily  . polyethylene glycol  17 g Oral BID  . senna  1 tablet Oral BID  . sodium chloride flush  3 mL Intravenous Q12H     LOS: 19 days   Cherene Altes, MD [facility] Office  [phone] Pager - Text Page per Amion  If 7PM-7AM, please contact night-coverage per Amion 05/25/2019, 3:55 PM

## 2019-05-25 NOTE — Progress Notes (Signed)
[facility] Progress Note   Called back to see pt on 05/20/19. He is a 68 year old WM with ICM- EF 40-45%, Afib, DM and also CKD (recent best crt baseline around 2). He was hosp at [facility] from 9/14-9/19- discharged, then readmitted 10/[facility]- S/p cardiac arrest on 10/7. He suffered A on CRF in that setting with crt peaking at 5.17 on 10/10, then improved slowly - renal service signed off. Transferred to the ICU for hypoxia and intubated 10/21 and treated with remdesevir again -> just extubated 10/24. Assessment/ Plan:    Renal-Baseline CKD- crt 2- This is second event of A on CRF this hosp. Creat / Bun stable, no indication RRT yet. Per CCM feel by CT patient may be intravasc wet, IV lasix started 10/22. Then BUN/ Cr rose significantly so CRRT started 10/24. Pt was able to extubated after diuresis/ UF over last few days.   Seen on CRRT, labs reviewed and I/O's 4K baths pre/post/dialysate at 400/200/1500 Not tolerating UF now > will change to keep +50 cc/hr for now Anticipate may be able to transfer to [facility] mid week   SP cardiac arrest - on 10/07  Atrial fib w/ RVR - on po metoprolol bid 12.5 for rate control  Hypotension - on pressors  COVID PNA/ underlying COPD- per CCM hopefully some of CT changes are due to vol excess, if not there will be not much hope for recovering  Hyperkalemia-Better  Hypernatremia - beter  Anemia-not a significant issue   HTN- BP'son the softer side today, on midodrine now  VDRF - per CCM  CAD h/o CABG  DM2  Subjective:   Cough present but he states dyspnea improved c/w yesterday   Objective:   BP (!) 86/69 (BP Location: Right Arm)   Pulse 94   Temp (!) 97.5 F (36.4 C) (Axillary)   Resp 16   Ht 5\' 11"  (1.803 m)   Wt 87.2 kg   SpO2 96%   BMI 26.81 kg/m   Intake/Output Summary (Last 24 hours) at 05/25/2019 1544 Last data filed at 05/25/2019 1500 Gross per 24 hour  Intake 3284.63 ml  Output 2988 ml   Net 296.63 ml   Weight change: -2.8 kg  Physical Exam: Extubated alert No jvd Cor reg no RG   Chest occ exp wheezing, no rales Abd obese, soft nontender ? Ascites Ext no pretib edema, mild hip edema Neuro- responsive  Imaging: No results found.  Labs: BMET Recent Labs  Lab 05/20/19 0452  05/21/19 1615 05/21/19 1720 05/22/19 0400 05/23/19 0406 05/23/19 0550 05/23/19 1630 05/24/19 0534 05/24/19 0535 05/24/19 1500 05/25/19 0500  NA 144   < >  --   --  148* 150* 151* 154* 145 147* 142 141  K 6.0*   < >  --   --  3.9 3.5 3.4* 3.3* 3.7 3.8 3.8 4.2  CL 105   < >  --   --  111  --  110 113* 107 109 107 106  CO2 25   < >  --   --  23  --  26 28 27 28 26 25   GLUCOSE 293*   < >  --   --  363*  --  287* 176* 130* 131* 115* 82  BUN 103*   < >  --   --  139*  --  169* 130* 97* 95* 68* 51*  CREATININE 3.25*   < >  --   --  3.70*  --  4.07* 3.71* 2.18* 2.13* 1.68* 1.63*  CALCIUM 9.1   < >  --   --  8.3*  --  8.4* 8.1* 8.3* 8.3* 7.7* 8.2*  PHOS 7.0*  --  4.9* 5.2*  --   --   --   --   --  3.1 3.1 2.6   < > = values in this interval not displayed.   CBC Recent Labs  Lab 05/22/19 0400 05/23/19 0406 05/23/19 0550 05/24/19 0534 05/25/19 0500  WBC 13.7*  --  14.1* 18.3* 12.1*  HGB 11.2* 12.6* 12.0* 14.4 12.5*  HCT 37.2* 37.0* 39.3 48.9 42.3  MCV 98.9  --  101.0* 101.9* 101.7*  PLT 146*  --  112* 120* 113*    Medications:    . calcitRIOL  0.25 mcg Oral Daily  . Chlorhexidine Gluconate Cloth  6 each Topical Daily  . diltiazem  30 mg Oral Q6H  . insulin aspart  0-20 Units Subcutaneous Q4H  . insulin glargine  20 Units Subcutaneous QHS  . Ipratropium-Albuterol  1 puff Inhalation Q6H  . levETIRAcetam  500 mg Oral BID  . Melatonin  6 mg Oral QHS  . metoprolol tartrate  100 mg Oral BID  . midodrine  5 mg Oral Q8H  . mometasone-formoterol  2 puff Inhalation BID  . pantoprazole  40 mg Oral Daily  . polyethylene glycol  17 g Oral BID  . senna  1 tablet Oral BID  .  sodium chloride flush  3 mL Intravenous Q12H

## 2019-05-25 NOTE — Progress Notes (Signed)
Patient called wife, Mardene Celeste on his cell phone and face timed with her. Patient gave her updates on how he was feeling.  I stood by to answer any questions Mardene Celeste may have regarding patient. She did not have any questions for me. She will contact if she has any questions or concerns.

## 2019-05-25 NOTE — Progress Notes (Addendum)
Occupational Therapy Treatment Patient Details Name: Kordae Buonocore MRN: [id number] DOB: 30-Jun-1951 Today's Date: 05/25/2019    History of present illness 68 y/o male treated for COVID pneumonia on September 13 returned to the ED on 10/7 with seizure and cardiac arrest.  Transitioned to floor where he had AKI which resolved but then later in his admission developed worsening respiratory failure and renal failure again.  Ultimately re-intubated on 10/21 and sent back to [facility].    OT comments  Pt seen for OT f/u after extubation 10/24. Pt on CRRT during session, RN okay with mobility. Pt presenting with greater weakness this date, ADL function updated below. Pt presents on 3L HFNC. He is overall a max A for bed mobility and sit <> stand at this time. Pt able to sit EOB for ~8 minutes VSS. He reports mild dizziness that resolved. He stood one time with HHA for bed linens to be changed. He continues to remain a great candidate for CIR as he progresses medically. Goals updated per current progress in care plan. Will continue to follow acutely.    Follow Up Recommendations  CIR    Equipment Recommendations  None recommended by OT    Recommendations for Other Services      Precautions / Restrictions Precautions Precautions: Fall Precaution Comments: watch O2, CRRT patient Restrictions Weight Bearing Restrictions: No       Mobility Bed Mobility Overal bed mobility: Needs Assistance Bed Mobility: Supine to Sit;Sit to Supine     Supine to sit: Max assist Sit to supine: Max assist   General bed mobility comments: max A guide BLEs EOB and support at trunk  Transfers Overall transfer level: Needs assistance Equipment used: 2 person hand held assist Transfers: Sit to/from Stand Sit to Stand: Max assist         General transfer comment: max A to rise and steady to change linens on bed    Balance Overall balance assessment: Needs assistance Sitting-balance support: Feet  supported;Bilateral upper extremity supported Sitting balance-Leahy Scale: Fair     Standing balance support: Bilateral upper extremity supported Standing balance-Leahy Scale: Zero                             ADL either performed or assessed with clinical judgement   ADL Overall ADL's : Needs assistance/impaired Eating/Feeding: NPO   Grooming: Set up;Sitting   Upper Body Bathing: Minimal assistance;Sitting   Lower Body Bathing: Maximal assistance;Sitting/lateral leans;Sit to/from stand   Upper Body Dressing : Minimal assistance;Sitting   Lower Body Dressing: Maximal assistance;Sitting/lateral leans;Sit to/from stand   Toilet Transfer: Maximal assistance;+2 for safety/equipment Toilet Transfer Details (indicate cue type and reason): max A +2 to stand this date from [place] and Hygiene: Total assistance;Sit to/from stand Toileting - Clothing Manipulation Details (indicate cue type and reason): total A to clean peri area while standing     Functional mobility during ADLs: Maximal assistance;+2 for safety/equipment(sit <> stand only this date) General ADL Comments: presenting with increased weakness and tolerance for mobility     Vision Baseline Vision/History: No visual deficits Patient Visual Report: No change from baseline Vision Assessment?: No apparent visual deficits   Perception     Praxis      Cognition Arousal/Alertness: Awake/alert Behavior During Therapy: WFL for tasks assessed/performed Overall Cognitive Status: Within Functional Limits for tasks assessed  General Comments: able to recall therapists from previous sessions        Exercises     Shoulder Instructions       General Comments      Pertinent Vitals/ Pain       Pain Assessment: Faces Faces Pain Scale: Hurts a little bit Pain Location: generalized with mobility Pain Descriptors / Indicators: Sore Pain  Intervention(s): Limited activity within patient's tolerance;Monitored during session;Repositioned  Home Living                                          Prior Functioning/Environment              Frequency  Min 2X/week        Progress Toward Goals  OT Goals(current goals can now be found in the care plan section)  Progress towards OT goals: Progressing toward goals  Acute Rehab OT Goals Patient Stated Goal: to get back home and fish OT Goal Formulation: With patient Time For Goal Achievement: 06/08/19 Potential to Achieve Goals: Good  Plan Discharge plan remains appropriate    Co-evaluation    PT/OT/SLP Co-Evaluation/Treatment: Yes Reason for Co-Treatment: Complexity of the patient's impairments (multi-system involvement);For patient/therapist safety;To address functional/ADL transfers PT goals addressed during session: Mobility/safety with mobility;Balance;Strengthening/ROM OT goals addressed during session: ADL's and self-care;Strengthening/ROM      AM-PAC OT "6 Clicks" Daily Activity     Outcome Measure   Help from another person eating meals?: A Little Help from another person taking care of personal grooming?: A Little Help from another person toileting, which includes using toliet, bedpan, or urinal?: A Lot Help from another person bathing (including washing, rinsing, drying)?: A Lot Help from another person to put on and taking off regular upper body clothing?: A Little Help from another person to put on and taking off regular lower body clothing?: A Lot 6 Click Score: 15    End of Session Equipment Utilized During Treatment: Oxygen  OT Visit Diagnosis: Unsteadiness on feet (R26.81);Muscle weakness (generalized) (M62.81);Other (comment)(cardiopulmonary status limiting)   Activity Tolerance Patient tolerated treatment well   Patient Left in bed;with call bell/phone within reach;with bed alarm set;with nursing/sitter in room   Nurse  Communication Mobility status        Time: 1020-1045 OT Time Calculation (min): 25 min  Charges: OT General Charges $OT Visit: 1 Visit OT Treatments $Self Care/Home Management : 8-22 mins  Zenovia Jarred, MSOT, OTR/L [place] OT/ Acute Relief OT GVC: [place] 05/25/2019, 1:20 PM

## 2019-05-25 NOTE — Progress Notes (Signed)
ANTICOAGULATION CONSULT NOTE - Follow Up Consult  Pharmacy Consult for Heparin Indication: atrial fibrillation  No Known Allergies  Patient Measurements: Height: 5\' 11"  (180.3 cm) Weight: 192 lb 3.9 oz (87.2 kg) IBW/kg (Calculated) : 75.3 Heparin Dosing Weight: 92 kg  Vital Signs: Temp: 97.4 F (36.3 C) (10/26 0800) Temp Source: Axillary (10/26 0800) BP: 98/68 (10/26 1000) Pulse Rate: 94 (10/26 1000)  Labs: Recent Labs    05/23/19 0550  05/23/19 1943  05/24/19 0534 05/24/19 0535 05/24/19 1500 05/24/19 1630 05/24/19 1845 05/24/19 2220 05/25/19 0500 05/25/19 0930  HGB 12.0*  --   --   --  14.4  --   --   --   --   --  12.5*  --   HCT 39.3  --   --   --  48.9  --   --   --   --   --  42.3  --   PLT 112*  --   --   --  120*  --   --   --   --   --  113*  --   APTT  --   --   --    < >  --  190*  --  32  --  >200*  --  78*  HEPARINUNFRC  --   --  >2.20*  --   --  >2.20*  --   --   --   --   --  1.26*  CREATININE 4.07*   < >  --   --  2.18* 2.13* 1.68*  --   --   --  1.63*  --   TROPONINIHS  --   --   --   --   --   --   --  261* 774*  --   --   --    < > = values in this interval not displayed.    Estimated Creatinine Clearance: 48.1 mL/min (A) (by C-G formula based on SCr of 1.63 mg/dL (H)).   Medications:  Infusions:  .  prismasol BGK 4/2.5 500 mL/hr at 05/25/19 0252  .  prismasol BGK 4/2.5 300 mL/hr at 05/25/19 0634  . sodium chloride 250 mL (05/23/19 0151)  . heparin 1,350 Units/hr (05/25/19 1100)  . phenylephrine (NEO-SYNEPHRINE) Adult infusion 120 mcg/min (05/25/19 1100)  . prismasol BGK 4/2.5 1,500 mL/hr at 05/25/19 1001    Assessment: 68 yo male previously hospitalized for COVID pneumonia 9/14-9/19 then returned for seizure-like activity and subsequent cardiac arrest 10/7.  On chronic apixaban for afib. Starting CRRT 10/24 for AKI with worsening uremia.  Pharmacy consulted to convert apixaban to IV heparin.  Last dose of apixaban given 10/24 at 09:23 AM.     Of note, patient's aPTT was elevated last night off of a central blood draw. Heparin was held and resumed at a lower rate. A repeat peripheral stick this AM resulted in a therapeutic aPTT of 78 on 1350 units/hr. Of note, patient has some tea colored urine but no overt s/s of bleeding    Goal of Therapy:  Heparin level 0.3-0.7 units/ml aPTT 66-102 seconds Monitor platelets by anticoagulation protocol: Yes   Plan:  Continue heparin at 1350 units/hr F/u repeat confirmatory aPTT in 6 hours  Will also draw heparin level at this time although expect to still be elevated due to apixaban  Albertina Parr, PharmD., BCPS Clinical Pharmacist Clinical phone for 05/25/19 until 5pm: [phone]

## 2019-05-26 ENCOUNTER — Encounter: Payer: Federal, State, Local not specified - PPO | Admitting: Physical Therapy

## 2019-05-26 DIAGNOSIS — U071 COVID-19: Secondary | ICD-10-CM

## 2019-05-26 DIAGNOSIS — J069 Acute upper respiratory infection, unspecified: Secondary | ICD-10-CM

## 2019-05-26 DIAGNOSIS — J9601 Acute respiratory failure with hypoxia: Secondary | ICD-10-CM | POA: Diagnosis not present

## 2019-05-26 DIAGNOSIS — N179 Acute kidney failure, unspecified: Secondary | ICD-10-CM | POA: Diagnosis not present

## 2019-05-26 DIAGNOSIS — I4891 Unspecified atrial fibrillation: Secondary | ICD-10-CM | POA: Diagnosis not present

## 2019-05-26 LAB — IRON AND TIBC
Iron: 110 ug/dL (ref 45–182)
Saturation Ratios: 56 % — ABNORMAL HIGH (ref 17.9–39.5)
TIBC: 197 ug/dL — ABNORMAL LOW (ref 250–450)
UIBC: 87 ug/dL

## 2019-05-26 LAB — VITAMIN B12: Vitamin B-12: 1075 pg/mL — ABNORMAL HIGH (ref 180–914)

## 2019-05-26 LAB — RETICULOCYTES
Immature Retic Fract: 9.8 % (ref 2.3–15.9)
RBC.: 4.47 MIL/uL (ref 4.22–5.81)
Retic Count, Absolute: 102.4 10*3/uL (ref 19.0–186.0)
Retic Ct Pct: 2.3 % (ref 0.4–3.1)

## 2019-05-26 LAB — GLUCOSE, CAPILLARY
Glucose-Capillary: 66 mg/dL — ABNORMAL LOW (ref 70–99)
Glucose-Capillary: 72 mg/dL (ref 70–99)
Glucose-Capillary: 125 mg/dL — ABNORMAL HIGH (ref 70–99)
Glucose-Capillary: 90 mg/dL (ref 70–99)
Glucose-Capillary: 88 mg/dL (ref 70–99)
Glucose-Capillary: 157 mg/dL — ABNORMAL HIGH (ref 70–99)

## 2019-05-26 LAB — RENAL FUNCTION PANEL
Albumin: 2.8 g/dL — ABNORMAL LOW (ref 3.5–5.0)
Albumin: 2 g/dL — ABNORMAL LOW (ref 3.5–5.0)
Anion gap: 8 (ref 5–15)
Anion gap: 5 (ref 5–15)
BUN: 33 mg/dL — ABNORMAL HIGH (ref 8–23)
BUN: 22 mg/dL (ref 8–23)
CO2: 24 mmol/L (ref 22–32)
CO2: 27 mmol/L (ref 22–32)
Calcium: 8.2 mg/dL — ABNORMAL LOW (ref 8.9–10.3)
Calcium: 7.7 mg/dL — ABNORMAL LOW (ref 8.9–10.3)
Chloride: 103 mmol/L (ref 98–111)
Chloride: 105 mmol/L (ref 98–111)
Creatinine, Ser: 1.62 mg/dL — ABNORMAL HIGH (ref 0.61–1.24)
Creatinine, Ser: 1.2 mg/dL (ref 0.61–1.24)
GFR calc Af Amer: 51 mL/min — ABNORMAL LOW (ref >60)
GFR calc Af Amer: >60 mL/min (ref >60)
GFR calc non Af Amer: 44 mL/min — ABNORMAL LOW (ref >60)
GFR calc non Af Amer: >60 mL/min (ref >60)
Glucose, Bld: 94 mg/dL (ref 70–99)
Glucose, Bld: 108 mg/dL — ABNORMAL HIGH (ref 70–99)
Phosphorus: 2 mg/dL — ABNORMAL LOW (ref 2.5–4.6)
Phosphorus: 2.9 mg/dL (ref 2.5–4.6)
Potassium: 4.1 mmol/L (ref 3.5–5.1)
Potassium: 4.4 mmol/L (ref 3.5–5.1)
Sodium: 135 mmol/L (ref 135–145)
Sodium: 137 mmol/L (ref 135–145)

## 2019-05-26 LAB — CBC
HCT: 45.6 % (ref 39.0–52.0)
Hemoglobin: 13.6 g/dL (ref 13.0–17.0)
MCH: 30.4 pg (ref 26.0–34.0)
MCHC: 29.8 g/dL — ABNORMAL LOW (ref 30.0–36.0)
MCV: 102 fL — ABNORMAL HIGH (ref 80.0–100.0)
Platelets: 125 10*3/uL — ABNORMAL LOW (ref 150–400)
RBC: 4.47 MIL/uL (ref 4.22–5.81)
RDW: 19.8 % — ABNORMAL HIGH (ref 11.5–15.5)
WBC: 10.7 10*3/uL — ABNORMAL HIGH (ref 4.0–10.5)
nRBC: 0 % (ref 0.0–0.2)

## 2019-05-26 LAB — HEPARIN LEVEL (UNFRACTIONATED)
Heparin Unfractionated: 0.87 IU/mL — ABNORMAL HIGH (ref 0.30–0.70)
Heparin Unfractionated: 0.63 IU/mL (ref 0.30–0.70)

## 2019-05-26 LAB — FOLATE: Folate: 6.4 ng/mL (ref >5.9)

## 2019-05-26 LAB — TROPONIN I (HIGH SENSITIVITY): Troponin I (High Sensitivity): 2883 ng/L (ref <18)

## 2019-05-26 LAB — APTT
aPTT: 56 seconds — ABNORMAL HIGH (ref 24–36)
aPTT: 63 seconds — ABNORMAL HIGH (ref 24–36)
aPTT: 69 seconds — ABNORMAL HIGH (ref 24–36)

## 2019-05-26 LAB — MAGNESIUM: Magnesium: 2.2 mg/dL (ref 1.7–2.4)

## 2019-05-26 LAB — FERRITIN: Ferritin: 874 ng/mL — ABNORMAL HIGH (ref 24–336)

## 2019-05-26 MED ORDER — ENSURE ENLIVE PO LIQD
237.0000 mL | Freq: Three times a day (TID) | ORAL | Status: DC
  Administered 2019-05-26: 237 mL via ORAL
  Filled 2019-05-26 (×9): qty 237

## 2019-05-26 MED ORDER — MIDODRINE HCL 5 MG PO TABS
10.0000 mg | ORAL_TABLET | Freq: Three times a day (TID) | ORAL | Status: DC
  Administered 2019-05-26 – 2019-05-27 (×4): 10 mg via ORAL
  Filled 2019-05-26 (×6): qty 2

## 2019-05-26 MED ORDER — SODIUM PHOSPHATES 45 MMOLE/15ML IV SOLN
20.0000 mmol | Freq: Once | INTRAVENOUS | Status: AC
  Administered 2019-05-26: 13:00:00 20 mmol via INTRAVENOUS
  Filled 2019-05-26: qty 6.67

## 2019-05-26 MED ORDER — HYDROCORTISONE NA SUCCINATE PF 100 MG IJ SOLR
50.0000 mg | Freq: Four times a day (QID) | INTRAMUSCULAR | Status: DC
  Administered 2019-05-26 – 2019-05-30 (×17): 50 mg via INTRAVENOUS
  Filled 2019-05-26 (×17): qty 2

## 2019-05-26 NOTE — Progress Notes (Signed)
ANTICOAGULATION CONSULT NOTE - Follow Up Consult  Pharmacy Consult for Heparin Indication: atrial fibrillation  No Known Allergies  Patient Measurements: Height: 5\' 11"  (180.3 cm) Weight: 191 lb 2.2 oz (86.7 kg) IBW/kg (Calculated) : 75.3 Heparin Dosing Weight: 92 kg  Vital Signs: Temp: 97.5 F (36.4 C) (10/27 1200) Temp Source: Oral (10/27 1200) BP: 80/59 (10/27 1245) Pulse Rate: 33 (10/27 1245)  Labs: Recent Labs    05/24/19 0534  05/24/19 1630 05/24/19 1845  05/25/19 0500 05/25/19 0930 05/25/19 1540 05/26/19 0010 05/26/19 0650 05/26/19 1030  HGB 14.4  --   --   --   --  12.5*  --   --  13.6  --   --   HCT 48.9  --   --   --   --  42.3  --   --  45.6  --   --   PLT 120*  --   --   --   --  113*  --   --  125*  --   --   APTT  --    < > 32  --    < >  --  78* 60* 56*  --   --   HEPARINUNFRC  --    < >  --   --   --   --  1.26*  --  0.87*  --  0.63  CREATININE 2.18*   < >  --   --   --  1.63*  --  1.42* 1.62*  --   --   TROPONINIHS  --   --  261* 774*  --   --   --   --   --  2,883*  --    < > = values in this interval not displayed.    Estimated Creatinine Clearance: 48.4 mL/min (A) (by C-G formula based on SCr of 1.62 mg/dL (H)).   Medications:  Infusions:  .  prismasol BGK 4/2.5 400 mL/hr at 05/25/19 2159  .  prismasol BGK 4/2.5 200 mL/hr at 05/25/19 1550  . sodium chloride 10 mL/hr at 05/26/19 0310  . heparin 1,600 Units/hr (05/26/19 1200)  . phenylephrine (NEO-SYNEPHRINE) Adult infusion 40 mcg/min (05/26/19 1201)  . prismasol BGK 4/2.5 1,500 mL/hr at 05/26/19 0655  . sodium phosphate  Dextrose 5% IVPB      Assessment: 68 yo male previously hospitalized for COVID pneumonia 9/14-9/19 then returned for seizure-like activity and subsequent cardiac arrest 10/7.  On chronic apixaban for afib. Starting CRRT 10/24 for AKI with worsening uremia.  Pharmacy consulted to convert apixaban to IV heparin.  Last dose of apixaban given 10/24 at 09:23 AM.    PTT  (peripheal stick) remains subtherapeutic at 63 but approaching goal. We will increase the rate again and recheck with a peripheral stick. Heparin level remains elevated (due to apixaban) and is not correlating with PTT just yet. CBC remains stable.  Goal of Therapy:  Heparin level 0.3-0.7 units/ml aPTT 66-102 seconds Monitor platelets by anticoagulation protocol: Yes   Plan:  Increase heparin to 1700 units/hr F/u aPTT in 8 hours - peripheral stick   Albertina Parr, PharmD., BCPS Clinical Pharmacist Clinical phone for 05/26/19 until 5pm: [phone]

## 2019-05-26 NOTE — Progress Notes (Signed)
ANTICOAGULATION CONSULT NOTE - Follow Up Consult  Pharmacy Consult for Heparin Indication: atrial fibrillation  No Known Allergies  Patient Measurements: Height: 5\' 11"  (180.3 cm) Weight: 192 lb 3.9 oz (87.2 kg) IBW/kg (Calculated) : 75.3 Heparin Dosing Weight: 92 kg  Vital Signs: Temp: 97.5 F (36.4 C) (10/27 0000) Temp Source: Oral (10/27 0000) BP: 108/76 (10/27 0200) Pulse Rate: 97 (10/27 0130)  Labs: Recent Labs    05/24/19 0534 05/24/19 0535  05/24/19 1630 05/24/19 1845  05/25/19 0500 05/25/19 0930 05/25/19 1540 05/26/19 0010  HGB 14.4  --   --   --   --   --  12.5*  --   --  13.6  HCT 48.9  --   --   --   --   --  42.3  --   --  45.6  PLT 120*  --   --   --   --   --  113*  --   --  125*  APTT  --  190*  --  32  --    < >  --  78* 60* 56*  HEPARINUNFRC  --  >2.20*  --   --   --   --   --  1.26*  --  0.87*  CREATININE 2.18* 2.13*   < >  --   --   --  1.63*  --  1.42* 1.62*  TROPONINIHS  --   --   --  261* 774*  --   --   --   --   --    < > = values in this interval not displayed.    Estimated Creatinine Clearance: 48.4 mL/min (A) (by C-G formula based on SCr of 1.62 mg/dL (H)).   Medications:  Infusions:  .  prismasol BGK 4/2.5 400 mL/hr at 05/25/19 2159  .  prismasol BGK 4/2.5 200 mL/hr at 05/25/19 1550  . sodium chloride 250 mL (05/26/19 0216)  . heparin 1,450 Units/hr (05/25/19 1900)  . phenylephrine (NEO-SYNEPHRINE) Adult infusion 80 mcg/min (05/25/19 2329)  . prismasol BGK 4/2.5 1,500 mL/hr at 05/25/19 2353    Assessment: 68 yo male previously hospitalized for COVID pneumonia 9/14-9/19 then returned for seizure-like activity and subsequent cardiac arrest 10/7.  On chronic apixaban for afib. Starting CRRT 10/24 for AKI with worsening uremia.  Pharmacy consulted to convert apixaban to IV heparin.  Last dose of apixaban given 10/24 at 09:23 AM.    Of note, patient's aPTT was elevated 10/25 pm off of a central blood draw. Heparin was held and resumed at  a lower rate. A repeat peripheral stick this AM resulted in a therapeutic aPTT of 78 on 1350 units/hr. Of note, patient has some tea colored urine but no overt s/s of bleeding    PTT  (peripheal stick) remains subtherapeutic at 56, actually down further after last drip increase. We will increase the rate again and recheck with a peripheral stick. Heparin level remains elevated (due to apixaban) and is not correlating with PTT just yet. CBC remains stable.  Goal of Therapy:  Heparin level 0.3-0.7 units/ml aPTT 66-102 seconds Monitor platelets by anticoagulation protocol: Yes   Plan:  Increase heparin to 1600 units/hr F/u aPTT in 8 hours - peripheral stick   Sherlon Handing, PharmD, BCPS CGV Clinical pharmacist phone [phone] 05/26/2019 2:17 AM

## 2019-05-26 NOTE — Progress Notes (Signed)
Spoken with NOK through facetime. Updated of pt's condition.

## 2019-05-26 NOTE — Progress Notes (Signed)
Dylan Goodman  YNW:[id number] DOB: [birth year] DOA: 2019-05-17 PCP: Steele Sizer, MD    Brief Narrative:  68 year old with a history of atrial fibrillation, GIB, CAD status post CABG, nasopharyngeal cancer, CKD, DM, and GERD who was previously treated for Covid pneumonia 9/17 and returned to the [facility] ED with reported seizure-like behavior, and subsequently suffered a cardiac arrest.  He was intubated and transferred to [facility] ICU.  Significant Events: 9/14 > 9/19 admitted to the [facility] for Covid pneumonia 10/5 ED evaluation with chest pain -discharged home 10/6 ED evaluation for seizure -CT head negative for acute findings 17-May-2023 coded while in ED awaiting placement -intubated 10/8 extubated -transferred out of ICU 10/15 hypoxia requiring PCCM consultation 10/19 high resolution CT -cardiomegaly, small pleural effusions, extensive patchy consolidation from last opacities -moderate to severe emphysema 10/21 intubation -vasc cath placement -transfer to [facility] 10/24 extubated  COVID-19 specific Treatment: Remdesivir 10/20 > 10/24  Subjective: Much more sedate today but does awaken to stimulation.  No evidence of respiratory distress.  Denies chest pain or shortness of breath.  Remains on CRRT.  Assessment & Plan:  Acute hypoxic respiratory failure - unclear etiology possible fibrosis versus edema - successfully extubated 10/24 -O2 need has increased somewhat today but patient is stable on high flow nasal cannula via the Salter cannula  Covid pneumonia September 2020 Has completed a second course of remdesivir - stopped Decadron given impressive uremia - not clinically convinced this represents recurrent or continued viral pneumonia - stable at this time w/o specific tx otherwise   Uremia  steroids stopped - rapidly improving with utilization of CRRT, w/ concomitant improvement in clinical condition and mental status -ongoing CRRT per nephrology  Recent Labs  Lab 05/24/19  0535 05/24/19 1500 05/25/19 0500 05/25/19 1540 05/26/19 0010  BUN 95* 68* 51* 39* 33*    Chronic atrial fibrillation with acute RVR chronically on Eliquis - transitioned to heparin drip while on CRRT - heart rate control proving challenging and being further complicated by hypotension - if persists will consider short-term trial of amiodarone drip but given pulmonary issues do not wish to consider long-term amiodarone - midodrine increased today for blood pressure support and hydrocortisone added due to low cortisol  Diffuse ST depression Noted on telemetry and confirmed with twelve-lead EKG - depression is deeper than time of previous EKG - troponin now markedly elevated -follow to peak -is presently asymptomatic -likely rate related ischemia - will need Cards eval once stabilized -awaiting follow-up EKG that was ordered this morning and reordered later in the morning  Mild systolic CHF TTE 65/7 noted EF 45-50% -volume management per CRRT -not grossly volume overloaded on physical exam  Seizure At presentation -questionably related to cardiac arrest -on Keppra for now -no evidence of recurrence - cont Keppra for now as per Neuro recs   DM 2 CBG controlled  Acute kidney injury on CKD Creatinine peaked at 5.17 10/10 -baseline creatinine 2 - Nephrology following -was reportedly being prepared for eventual onset of hemodialysis dependence as an outpatient -tolerating CRRT thus far -when medically stable the plan is to transfer to [facility] with transition to intermittent hemodialysis per Nephrology  Recent Labs  Lab 05/24/19 0535 05/24/19 1500 05/25/19 0500 05/25/19 1540 05/26/19 0010  CREATININE 2.13* 1.68* 1.63* 1.42* 1.62*    Mild transaminitis Possible mild shock liver versus effect of remdesivir versus right heart failure/congestion -recheck in a.m.  Deconditioning Attempt to get up in chair  Macrocytosis B12 and folate not low  DVT prophylaxis: Eliquis Code Status: FULL  CODE Family Communication: Per PCCM Disposition Plan: ICU on CRRT  Consultants:  Neurology Cardiology Nephrology PCCM  Antimicrobials:  Zosyn 10/7 > 10/11  Objective: Blood pressure 104/66, pulse (!) 107, temperature (!) 97.5 F (36.4 C), temperature source Axillary, resp. rate (!) 26, height 5\' 11"  (1.803 m), weight 86.7 kg, SpO2 98 %.  Intake/Output Summary (Last 24 hours) at 05/26/2019 0841 Last data filed at 05/26/2019 0800 Gross per 24 hour  Intake 1813.3 ml  Output 2149 ml  Net -335.7 ml   Filed Weights   05/24/19 0500 05/25/19 0500 05/26/19 0500  Weight: 90 kg 87.2 kg 86.7 kg    Examination: General: More sedate today -no respiratory distress Lungs: Improved air movement throughout with no wheezing Cardiovascular: Irregularly irregular with rate 80-90 Abdomen: soft, BS+, no rebound Extremities: no edema B lower extremities  CBC: Recent Labs  Lab 05/24/19 0534 05/25/19 0500 05/26/19 0010  WBC 18.3* 12.1* 10.7*  HGB 14.4 12.5* 13.6  HCT 48.9 42.3 45.6  MCV 101.9* 101.7* 102.0*  PLT 120* 113* 644*   Basic Metabolic Panel: Recent Labs  Lab 05/24/19 0534  05/25/19 0500 05/25/19 1540 05/26/19 0010  NA 145   < > 141 135 135  K 3.7   < > 4.2 4.3 4.1  CL 107   < > 106 103 103  CO2 27   < > 25 25 24   GLUCOSE 130*   < > 82 177* 94  BUN 97*   < > 51* 39* 33*  CREATININE 2.18*   < > 1.63* 1.42* 1.62*  CALCIUM 8.3*   < > 8.2* 7.7* 8.2*  MG 2.1  --  2.2  --  2.2  PHOS  --    < > 2.6 2.3* 2.0*   < > = values in this interval not displayed.   GFR: Estimated Creatinine Clearance: 48.4 mL/min (A) (by C-G formula based on SCr of 1.62 mg/dL (H)).  Liver Function Tests: Recent Labs  Lab 05/21/19 0510 05/22/19 0400 05/23/19 0550 05/24/19 0534  05/24/19 1500 05/25/19 0500 05/25/19 1540 05/26/19 0010  AST 46* 74* 75* 60*  --   --   --   --   --   ALT 105* 104* 122* 121*  --   --   --   --   --   ALKPHOS 149* 190* 202* 235*  --   --   --   --   --    BILITOT 1.6* 1.3* 1.1 2.0*  --   --   --   --   --   PROT 5.9* 4.8* 4.9* 5.8*  --   --   --   --   --   ALBUMIN 2.9* 2.4* 2.2* 2.6*   < > 2.4* 2.4* 2.2* 2.8*   < > = values in this interval not displayed.    HbA1C: Hgb A1c MFr Bld  Date/Time Value Ref Range Status  03/12/2019 07:14 AM 6.7 (H) 4.8 - 5.6 % Final    Comment:    (NOTE) Pre diabetes:          5.7%-6.4% Diabetes:              >6.4% Glycemic control for   <7.0% adults with diabetes   09/08/2018 12:03 PM 6.3 (H) <5.7 % of total Hgb Final    Comment:    For someone without known diabetes, a hemoglobin  A1c value between 5.7% and 6.4% is consistent with  prediabetes and should be confirmed with a  follow-up test. . For someone with known diabetes, a value <7% indicates that their diabetes is well controlled. A1c targets should be individualized based on duration of diabetes, age, comorbid conditions, and other considerations. . This assay result is consistent with an increased risk of diabetes. . Currently, no consensus exists regarding use of hemoglobin A1c for diagnosis of diabetes for children. .     CBG: Recent Labs  Lab 05/25/19 0947 05/25/19 1152 05/25/19 1613 05/25/19 2100 05/26/19 0820  GLUCAP 96 103* 165* 91 66*     Scheduled Meds: . calcitRIOL  0.25 mcg Oral Daily  . Chlorhexidine Gluconate Cloth  6 each Topical Daily  . diltiazem  30 mg Oral Q6H  . insulin aspart  0-5 Units Subcutaneous QHS  . insulin aspart  0-9 Units Subcutaneous TID WC  . insulin glargine  12 Units Subcutaneous QHS  . Ipratropium-Albuterol  1 puff Inhalation Q6H  . levETIRAcetam  500 mg Oral BID  . Melatonin  6 mg Oral QHS  . metoprolol tartrate  100 mg Oral BID  . midodrine  5 mg Oral Q8H  . mometasone-formoterol  2 puff Inhalation BID  . pantoprazole  40 mg Oral Daily  . polyethylene glycol  17 g Oral BID  . senna  1 tablet Oral BID  . sodium chloride flush  3 mL Intravenous Q12H     LOS: 20 days   Cherene Altes, MD [facility] Office  [phone] Pager - Text Page per Amion  If 7PM-7AM, please contact night-coverage per Amion 05/26/2019, 8:41 AM

## 2019-05-26 NOTE — Progress Notes (Signed)
[facility] Progress Note   Called back to see pt on 05/20/19. He is a 68 year old WM with ICM- EF 40-45%, Afib, DM and also CKD (recent best crt baseline around 2). He was hosp at [facility] from 9/14-9/19- discharged, then readmitted 10/6 to [facility]- S/p cardiac arrest on 10/7. He suffered A on CRF in that setting with crt peaking at 5.17 on 10/10, then improved slowly - renal service signed off. Transferred to the ICU for hypoxia and intubated 10/21 and treated with remdesevir again -> just extubated 10/24. Assessment/ Plan:    Renal-Baseline CKD- crt 2- This is second event of A on CRF this hosp. Creat / Bun stable, no indication RRT yet. Per CCM feel by CT patient may be intravasc wet, IV lasix started 10/22. Then BUN/ Cr rose significantly so CRRT started 10/24. Pt was able to extubated after diuresis/ UF over last few days.   - labs reviewed and I/O's, UOP 700 cc yesterday - 4K baths pre/post/dialysate at 400/200/1500 - continue keep +50 cc/hr - getting IV Phos for low phos per pharm today 20 mmol  - anticipate may be able to transfer to [facility] mid week   SP cardiac arrest - on 10/07  Atrial fib w/ RVR - on po metoprolol bid 12.5 for rate control  Hypotension - on pressors  COVID PNA/ underlying COPD- per CCM hopefully some of CT changes are due to vol excess, if not there will be not much hope for recovering  Hyperkalemia-Better  Hypernatremia - beter  Anemia-not a significant issue   HTN- BP's better, low 100's and 90's, on midodrine now  VDRF - per CCM  CAD h/o CABG  DM2  Pt seen, examined and agree w A/P as above.  Kelly Splinter  MD 05/26/2019, 4:40 PM    Subjective:    Patient not examined directly given COVID-19 + status, utilizing exam of the primary team and observations of RN's.     Objective:   BP 95/65 (BP Location: Right Arm)   Pulse (!) 108   Temp 98.1 F (36.7 C) (Axillary)   Resp (!) 24   Ht 5\' 11"  (1.803 m)   Wt  86.7 kg   SpO2 93%   BMI 26.66 kg/m   Intake/Output Summary (Last 24 hours) at 05/26/2019 1637 Last data filed at 05/26/2019 1600 Gross per 24 hour  Intake 1647.64 ml  Output 1431 ml  Net 216.64 ml   Weight change: -0.5 kg  Physical Exam:  Patient not examined directly given COVID-19 + status, utilizing exam of the primary team and observations of RN's.    Imaging: No results found.  Labs: BMET Recent Labs  Lab 05/21/19 1615 05/21/19 1720  05/23/19 1630 05/24/19 0534 05/24/19 0535 05/24/19 1500 05/25/19 0500 05/25/19 1540 05/26/19 0010  NA  --   --    < > 154* 145 147* 142 141 135 135  K  --   --    < > 3.3* 3.7 3.8 3.8 4.2 4.3 4.1  CL  --   --    < > 113* 107 109 107 106 103 103  CO2  --   --    < > 28 27 28 26 25 25 24   GLUCOSE  --   --    < > 176* 130* 131* 115* 82 177* 94  BUN  --   --    < > 130* 97* 95* 68* 51* 39* 33*  CREATININE  --   --    < >  3.71* 2.18* 2.13* 1.68* 1.63* 1.42* 1.62*  CALCIUM  --   --    < > 8.1* 8.3* 8.3* 7.7* 8.2* 7.7* 8.2*  PHOS 4.9* 5.2*  --   --   --  3.1 3.1 2.6 2.3* 2.0*   < > = values in this interval not displayed.   CBC Recent Labs  Lab 05/23/19 0550 05/24/19 0534 05/25/19 0500 05/26/19 0010  WBC 14.1* 18.3* 12.1* 10.7*  HGB 12.0* 14.4 12.5* 13.6  HCT 39.3 48.9 42.3 45.6  MCV 101.0* 101.9* 101.7* 102.0*  PLT 112* 120* 113* 125*    Medications:    . calcitRIOL  0.25 mcg Oral Daily  . Chlorhexidine Gluconate Cloth  6 each Topical Daily  . diltiazem  30 mg Oral Q6H  . feeding supplement (ENSURE ENLIVE)  237 mL Oral TID BM  . hydrocortisone sodium succinate  50 mg Intravenous Q6H  . insulin aspart  0-5 Units Subcutaneous QHS  . insulin aspart  0-9 Units Subcutaneous TID WC  . insulin glargine  12 Units Subcutaneous QHS  . Ipratropium-Albuterol  1 puff Inhalation Q6H  . levETIRAcetam  500 mg Oral BID  . Melatonin  6 mg Oral QHS  . metoprolol tartrate  100 mg Oral BID  . midodrine  10 mg Oral Q8H  .  mometasone-formoterol  2 puff Inhalation BID  . pantoprazole  40 mg Oral Daily  . polyethylene glycol  17 g Oral BID  . senna  1 tablet Oral BID  . sodium chloride flush  3 mL Intravenous Q12H

## 2019-05-26 NOTE — Progress Notes (Signed)
Nutrition Follow-up  DOCUMENTATION CODES:   Not applicable  INTERVENTION:    Add Ensure Enlive po TID, each supplement provides 350 kcal and 20 grams of protein.   Monitor glucose and adjust DM medications as required for optimal glycemic control.  NUTRITION DIAGNOSIS:   Increased nutrient needs related to acute illness as evidenced by estimated needs.  Ongoing   GOAL:   Patient will meet greater than or equal to 90% of their needs  Unmet  MONITOR:   Vent status, TF tolerance, Skin, Labs, I & O's  ASSESSMENT:   68 yo male admitted with seizure and brief cardiac arrest in the [facility] ED. Required intubation and was transferred to [facility]. PMH includes recent COVID-19 PNA (at 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  Extubated 10/24. TF off since extubation. CRRT initiated 10/24, ongoing. Diet has been advanced to CHO modified. Patient consumed 15% of breakfast today. Since extubation, intake has been very poor.  Labs reviewed. Phosphorus 2 (L) Troponin 2,883 (H)  CBG's: 72-125-90-88  Medications reviewed and include calcitriol, solu-cortef, novolog, lantus.  Weight down to 86.7 kg today. I/O -7.7 L since admission.   Diet Order:   Diet Order            Diet Carb Modified Fluid consistency: Thin; Room service appropriate? Yes with Assist  Diet effective now              EDUCATION NEEDS:   Not appropriate for education at this time  Skin:  Skin Assessment: Skin Integrity Issues: Skin Integrity Issues:: Stage I Stage I: sacrum  Last BM:  10/26 Type 7  Height:   Ht Readings from Last 1 Encounters:  05/21/19 5\' 11"  (1.803 m)    Weight:   Wt Readings from Last 1 Encounters:  05/26/19 86.7 kg    Ideal Body Weight:  78.2 kg  BMI:  Body mass index is 26.66 kg/m.  Estimated Nutritional Needs:   Kcal:  2200-2400  Protein:  140-160 gm  Fluid:  >/= 2.1  L    Molli Barrows, RD, LDN, [place] Pager [phone] After Hours Pager [phone]

## 2019-05-26 NOTE — Progress Notes (Signed)
NAME:  Ollin Hochmuth, MRN:  [id number], DOB:  1951/02/23, LOS: 2 ADMISSION DATE:  2019/05/08, CONSULTATION DATE:  May 08, 2023 REFERRING MD:  Reino Kent EDP, CHIEF COMPLAINT:  Cardiac arrest   Brief History   68 y/o male treated for COVID pneumonia on September 13 returned to the ED on 05/08/2023 with seizure and cardiac arrest.  Transitioned to floor where he had AKI which resolved but then later in his admission developed worsening respiratory failure and renal failure again.  Ultimately re-intubated on 10/21 and sent back to [facility].   Past Medical History  Systolic heart failure LVEF 40-45% Atrial fib GI bleed CAD Depression CKD DM2 Gastric ulcer GERD CABG Iron def anemia Skin cancer  Significant Hospital Events   9/14-9/19 admit to [facility] for COVID pneumonia.  10/05 to ED for Chest pain. Discharged home after negative cardiac. 10/06 To ED for seizure 05-08-23 Coded in ED while awaiting placement 10/15 PCCM called back  10/17 Remains on 100%, 30L flow 10/20 placed back on remdesevir, when attempting to transfer to [facility] desaturated. Moved to ICU 10/21 renal fxn worse  10/24 extubated, started on CVVHD  Consults:  Neurology Cardiology Nephrology PCCM  Procedures:  ETT 2023/05/08 >> 10/8 ETT 10/21 >> 10/24 R IJ HD Cath 10/21 >>  Significant Diagnostic Tests:  Renal US 9/14 >> WNL Lower extremity doppler 05-08-2023 >> CT head 10/6 >> neg CXR 10/15 >> ILD, possible some volume overload Echo 10/9 >> LVEF 45-50%, global normal RV systolic function, LA severely dilated, RA moderately dilated, mild MVR, mild TR, moderately elevated pulmonary artery systolic pressure 10/17 CT chest images personally reviewed: underlying emphysema, bilateral effusion, cardiomegally, bilateral ground glass and consolidation  Micro Data:  05/08/23 blood > neg 10/8 resp > opf 2023/05/08 urine > neg  Antimicrobials/COVID RX  10/20 cefepime > 10/22 10/20 remdesivir >   Interim history/subjective:   Feels better  Remains on neo for BP support  Objective   Blood pressure 104/66, pulse (!) 107, temperature (!) 97.5 F (36.4 C), temperature source Axillary, resp. rate (!) 26, height 5\' 11"  (1.803 m), weight 86.7 kg, SpO2 98 %.  Intake/Output Summary (Last 24 hours) at 05/26/2019 5102 Last data filed at 05/26/2019 0900 Gross per 24 hour  Intake 1922.26 ml  Output 2093 ml  Net -170.74 ml   Filed Weights   05/24/19 0500 05/25/19 0500 05/26/19 0500  Weight: 90 kg 87.2 kg 86.7 kg   Examination:  General:  Chronically ill appearing male, NAD HENT: /AT, PERRL, EOM-I and MMM PULM: Bibasilar crackles CV: RRR, Nl S1/S2 and -M/R/G GI: Soft, NT, ND and +BS MSK: normal bulk and tone Neuro: awake, alert, no distress, MAEW  I reviewed CXR myself, infiltrate noted  Resolved Hospital Problem list     Assessment & Plan:  AKI: appears dry/euvolemic on physical exam but lungs look wet on CT chest from 10/19 (pleural effusions, pulmonary hypertension, interlobular septal thickening) Initial plan from renal yesterday was to continue ultrafiltration through Wednesday with CVVHD, then consider transfer to [facility] for intermittent HD later in the week Continue CRRT as ordered with volume negative Monitor UOP  Acute respiratory failure with hypoxemia: significantly improved, unclear if pulmonary edema or inflammation, suspect more the former Start stress dose steroids Administer O2 to maintain O2 saturation > 85% If hypoxemia worsens restart steroids again  Underlying emphysema Dulera bid combivent qid  Seizure: generalized tonic clonic seizure on admission, none since Discussed with neuro 10/25, they recommend continued treatment Keppra 500mg  bid Outpatient  neuro follow up  Systolic heart failure: lungs appear wet to me on chest imaging Continue metoprolol  Hypotension in setting of systolic heart failure, multiple drugs for rate control Increase midodrine to 10 q8 Stress dose steroids Decrease  lopressor to 50 BID from 100 Continue neosynephrine titrated for MAP > 55, SBP > 90  Atrial fib Tele Metoprolol Diltiazem Amiodarone  Best practice:  Diet: tube feeding Pain/Anxiety/Delirium protocol (if indicated): d/c today  VAP protocol (if indicated): d/c DVT prophylaxis: eliquis GI prophylaxis: famotidine, renal dosing Glucose control: per TRH Mobility: bed rest Code Status: full Family Communication: will discuss who will call family with [facility] Disposition: remain in ICU  Labs   CBC: Recent Labs  Lab 05/22/19 0400 05/23/19 0406 05/23/19 0550 05/24/19 0534 05/25/19 0500 05/26/19 0010  WBC 13.7*  --  14.1* 18.3* 12.1* 10.7*  HGB 11.2* 12.6* 12.0* 14.4 12.5* 13.6  HCT 37.2* 37.0* 39.3 48.9 42.3 45.6  MCV 98.9  --  101.0* 101.9* 101.7* 102.0*  PLT 146*  --  112* 120* 113* 125*    Basic Metabolic Panel: Recent Labs  Lab 05/21/19 1615 05/21/19 1720  05/24/19 0534 05/24/19 0535 05/24/19 1500 05/25/19 0500 05/25/19 1540 05/26/19 0010  NA  --   --    < > 145 147* 142 141 135 135  K  --   --    < > 3.7 3.8 3.8 4.2 4.3 4.1  CL  --   --    < > 107 109 107 106 103 103  CO2  --   --    < > 27 28 26 25 25 24   GLUCOSE  --   --    < > 130* 131* 115* 82 177* 94  BUN  --   --    < > 97* 95* 68* 51* 39* 33*  CREATININE  --   --    < > 2.18* 2.13* 1.68* 1.63* 1.42* 1.62*  CALCIUM  --   --    < > 8.3* 8.3* 7.7* 8.2* 7.7* 8.2*  MG 2.3 2.1  --  2.1  --   --  2.2  --  2.2  PHOS 4.9* 5.2*  --   --  3.1 3.1 2.6 2.3* 2.0*   < > = values in this interval not displayed.   GFR: Estimated Creatinine Clearance: 48.4 mL/min (A) (by C-G formula based on SCr of 1.62 mg/dL (H)). Recent Labs  Lab 05/20/19 0452  05/23/19 0550 05/24/19 0534 05/25/19 0500 05/26/19 0010  PROCALCITON 0.34  --   --   --   --   --   WBC  --    < > 14.1* 18.3* 12.1* 10.7*   < > = values in this interval not displayed.    Liver Function Tests: Recent Labs  Lab 05/20/19 0452 05/21/19 0510 05/22/19  0400 05/23/19 0550 05/24/19 0534 05/24/19 0535 05/24/19 1500 05/25/19 0500 05/25/19 1540 05/26/19 0010  AST 62* 46* 74* 75* 60*  --   --   --   --   --   ALT 123* 105* 104* 122* 121*  --   --   --   --   --   ALKPHOS 170* 149* 190* 202* 235*  --   --   --   --   --   BILITOT 2.0* 1.6* 1.3* 1.1 2.0*  --   --   --   --   --   PROT 6.0* 5.9* 4.8* 4.9* 5.8*  --   --   --   --   --  ALBUMIN 2.9* 2.9* 2.4* 2.2* 2.6* 2.5* 2.4* 2.4* 2.2* 2.8*   No results for input(s): LIPASE, AMYLASE in the last 168 hours. No results for input(s): AMMONIA in the last 168 hours.  ABG    Component Value Date/Time   PHART 7.473 (H) 05/23/2019 0406   PCO2ART 34.9 05/23/2019 0406   PO2ART 106.0 05/23/2019 0406   HCO3 25.7 05/23/2019 0406   TCO2 27 05/23/2019 0406   ACIDBASEDEF 2.9 (H) 05/14/2019 0625   O2SAT 99.0 05/23/2019 0406     Coagulation Profile: No results for input(s): INR, PROTIME in the last 168 hours.  Cardiac Enzymes: No results for input(s): CKTOTAL, CKMB, CKMBINDEX, TROPONINI in the last 168 hours.  HbA1C: Hgb A1c MFr Bld  Date/Time Value Ref Range Status  03/12/2019 07:14 AM 6.7 (H) 4.8 - 5.6 % Final    Comment:    (NOTE) Pre diabetes:          5.7%-6.4% Diabetes:              >6.4% Glycemic control for   <7.0% adults with diabetes   09/08/2018 12:03 PM 6.3 (H) <5.7 % of total Hgb Final    Comment:    For someone without known diabetes, a hemoglobin  A1c value between 5.7% and 6.4% is consistent with prediabetes and should be confirmed with a  follow-up test. . For someone with known diabetes, a value <7% indicates that their diabetes is well controlled. A1c targets should be individualized based on duration of diabetes, age, comorbid conditions, and other considerations. . This assay result is consistent with an increased risk of diabetes. . Currently, no consensus exists regarding use of hemoglobin A1c for diagnosis of diabetes for children. .     CBG:  Recent Labs  Lab 05/25/19 1152 05/25/19 1613 05/25/19 2100 05/26/19 0820 05/26/19 0853  GLUCAP 103* 165* 91 66* 72   The patient is critically ill with multiple organ systems failure and requires high complexity decision making for assessment and support, frequent evaluation and titration of therapies, application of advanced monitoring technologies and extensive interpretation of multiple databases.   Critical Care Time devoted to patient care services described in this note is  31  Minutes. This time reflects time of care of this signee Dr Jennet Maduro. This critical care time does not reflect procedure time, or teaching time or supervisory time of PA/NP/Med student/Med Resident etc but could involve care discussion time.  Rush Farmer, M.D. [facility] Pulmonary/Critical Care Medicine.

## 2019-05-26 NOTE — Plan of Care (Signed)
Pt has been updated on and agrees with current POC.

## 2019-05-27 DIAGNOSIS — J1289 Other viral pneumonia: Secondary | ICD-10-CM

## 2019-05-27 DIAGNOSIS — J810 Acute pulmonary edema: Secondary | ICD-10-CM

## 2019-05-27 DIAGNOSIS — N179 Acute kidney failure, unspecified: Secondary | ICD-10-CM | POA: Diagnosis not present

## 2019-05-27 DIAGNOSIS — I4891 Unspecified atrial fibrillation: Secondary | ICD-10-CM | POA: Diagnosis not present

## 2019-05-27 DIAGNOSIS — U071 COVID-19: Secondary | ICD-10-CM | POA: Diagnosis not present

## 2019-05-27 DIAGNOSIS — L8990 Pressure ulcer of unspecified site, unspecified stage: Secondary | ICD-10-CM | POA: Insufficient documentation

## 2019-05-27 LAB — APTT: aPTT: 86 seconds — ABNORMAL HIGH (ref 24–36)

## 2019-05-27 LAB — HEPATIC FUNCTION PANEL
ALT: 78 U/L — ABNORMAL HIGH (ref 0–44)
AST: 49 U/L — ABNORMAL HIGH (ref 15–41)
Albumin: 2.1 g/dL — ABNORMAL LOW (ref 3.5–5.0)
Alkaline Phosphatase: 320 U/L — ABNORMAL HIGH (ref 38–126)
Bilirubin, Direct: 0.5 mg/dL — ABNORMAL HIGH (ref 0.0–0.2)
Indirect Bilirubin: 0.8 mg/dL (ref 0.3–0.9)
Total Bilirubin: 1.3 mg/dL — ABNORMAL HIGH (ref 0.3–1.2)
Total Protein: 4.7 g/dL — ABNORMAL LOW (ref 6.5–8.1)

## 2019-05-27 LAB — RENAL FUNCTION PANEL
Albumin: 2.3 g/dL — ABNORMAL LOW (ref 3.5–5.0)
Albumin: 2.2 g/dL — ABNORMAL LOW (ref 3.5–5.0)
Anion gap: 9 (ref 5–15)
Anion gap: 7 (ref 5–15)
BUN: 21 mg/dL (ref 8–23)
BUN: 19 mg/dL (ref 8–23)
CO2: 26 mmol/L (ref 22–32)
CO2: 23 mmol/L (ref 22–32)
Calcium: 8.1 mg/dL — ABNORMAL LOW (ref 8.9–10.3)
Calcium: 7.6 mg/dL — ABNORMAL LOW (ref 8.9–10.3)
Chloride: 104 mmol/L (ref 98–111)
Chloride: 105 mmol/L (ref 98–111)
Creatinine, Ser: 1.33 mg/dL — ABNORMAL HIGH (ref 0.61–1.24)
Creatinine, Ser: 1.22 mg/dL (ref 0.61–1.24)
GFR calc Af Amer: >60 mL/min (ref >60)
GFR calc Af Amer: >60 mL/min (ref >60)
GFR calc non Af Amer: 56 mL/min — ABNORMAL LOW (ref >60)
GFR calc non Af Amer: >60 mL/min (ref >60)
Glucose, Bld: 158 mg/dL — ABNORMAL HIGH (ref 70–99)
Glucose, Bld: 215 mg/dL — ABNORMAL HIGH (ref 70–99)
Phosphorus: 3.3 mg/dL (ref 2.5–4.6)
Phosphorus: 2.2 mg/dL — ABNORMAL LOW (ref 2.5–4.6)
Potassium: 5.4 mmol/L — ABNORMAL HIGH (ref 3.5–5.1)
Potassium: 4.1 mmol/L (ref 3.5–5.1)
Sodium: 139 mmol/L (ref 135–145)
Sodium: 135 mmol/L (ref 135–145)

## 2019-05-27 LAB — CBC
HCT: 34.6 % — ABNORMAL LOW (ref 39.0–52.0)
Hemoglobin: 10.1 g/dL — ABNORMAL LOW (ref 13.0–17.0)
MCH: 29.9 pg (ref 26.0–34.0)
MCHC: 29.2 g/dL — ABNORMAL LOW (ref 30.0–36.0)
MCV: 102.4 fL — ABNORMAL HIGH (ref 80.0–100.0)
Platelets: 109 10*3/uL — ABNORMAL LOW (ref 150–400)
RBC: 3.38 MIL/uL — ABNORMAL LOW (ref 4.22–5.81)
RDW: 19.8 % — ABNORMAL HIGH (ref 11.5–15.5)
WBC: 11.9 10*3/uL — ABNORMAL HIGH (ref 4.0–10.5)
nRBC: 0 % (ref 0.0–0.2)

## 2019-05-27 LAB — HEPARIN LEVEL (UNFRACTIONATED): Heparin Unfractionated: 0.68 IU/mL (ref 0.30–0.70)

## 2019-05-27 LAB — GLUCOSE, CAPILLARY
Glucose-Capillary: 147 mg/dL — ABNORMAL HIGH (ref 70–99)
Glucose-Capillary: 140 mg/dL — ABNORMAL HIGH (ref 70–99)
Glucose-Capillary: 176 mg/dL — ABNORMAL HIGH (ref 70–99)
Glucose-Capillary: 176 mg/dL — ABNORMAL HIGH (ref 70–99)
Glucose-Capillary: 190 mg/dL — ABNORMAL HIGH (ref 70–99)

## 2019-05-27 LAB — MAGNESIUM: Magnesium: 2.1 mg/dL (ref 1.7–2.4)

## 2019-05-27 LAB — TROPONIN I (HIGH SENSITIVITY): Troponin I (High Sensitivity): 1611 ng/L (ref <18)

## 2019-05-27 MED ORDER — SODIUM ZIRCONIUM CYCLOSILICATE 10 G PO PACK
10.0000 g | PACK | Freq: Two times a day (BID) | ORAL | Status: AC
  Administered 2019-05-27 – 2019-05-28 (×4): 10 g via ORAL
  Filled 2019-05-27 (×4): qty 1

## 2019-05-27 MED ORDER — METOPROLOL TARTRATE 25 MG PO TABS
25.0000 mg | ORAL_TABLET | Freq: Two times a day (BID) | ORAL | Status: DC
  Administered 2019-05-27 – 2019-05-28 (×3): 25 mg via ORAL
  Filled 2019-05-27 (×2): qty 1

## 2019-05-27 NOTE — Progress Notes (Signed)
ANTICOAGULATION CONSULT NOTE - Follow Up Consult  Pharmacy Consult for Heparin Indication: atrial fibrillation  No Known Allergies  Patient Measurements: Height: 5\' 11"  (180.3 cm) Weight: 191 lb 2.2 oz (86.7 kg) IBW/kg (Calculated) : 75.3 Heparin Dosing Weight: 92 kg  Vital Signs: Temp: 98.1 F (36.7 C) (10/28 0000) Temp Source: Axillary (10/28 0000) BP: 93/59 (10/28 0000) Pulse Rate: 96 (10/28 0000)  Labs: Recent Labs    05/24/19 0534  05/24/19 1630 05/24/19 1845  05/25/19 0500 05/25/19 0930 05/25/19 1540 05/26/19 0010 05/26/19 0650 05/26/19 1030 05/26/19 1530 05/26/19 2230  HGB 14.4  --   --   --   --  12.5*  --   --  13.6  --   --   --   --   HCT 48.9  --   --   --   --  42.3  --   --  45.6  --   --   --   --   PLT 120*  --   --   --   --  113*  --   --  125*  --   --   --   --   APTT  --    < > 32  --    < >  --  78* 60* 56*  --  63*  --  69*  HEPARINUNFRC  --    < >  --   --   --   --  1.26*  --  0.87*  --  0.63  --   --   CREATININE 2.18*   < >  --   --   --  1.63*  --  1.42* 1.62*  --   --  1.20  --   TROPONINIHS  --   --  261* 774*  --   --   --   --   --  2,883*  --   --   --    < > = values in this interval not displayed.    Estimated Creatinine Clearance: 65.4 mL/min (by C-G formula based on SCr of 1.2 mg/dL).   Medications:  Infusions:  .  prismasol BGK 4/2.5 400 mL/hr at 05/25/19 2159  .  prismasol BGK 4/2.5 200 mL/hr at 05/26/19 2345  . sodium chloride 250 mL (05/26/19 2048)  . heparin 1,700 Units/hr (05/27/19 0000)  . phenylephrine (NEO-SYNEPHRINE) Adult infusion 50 mcg/min (05/27/19 0000)  . prismasol BGK 4/2.5 1,500 mL/hr at 05/26/19 2053    Assessment: 68 yo male previously hospitalized for COVID pneumonia 9/14-9/19 then returned for seizure-like activity and subsequent cardiac arrest 10/7.  On chronic apixaban for afib. Starting CRRT 10/24 for AKI with worsening uremia.  Pharmacy consulted to convert apixaban to IV heparin.  Last dose of  apixaban given 10/24 at 09:23 AM.    05-27-19 0000 UPDATE: aPTT (peripheal stick) is now within  therapeutic goal range at 69.  rangel. We will continue current heparin rate at 1700 unit/hr and  Obtain aPTT and heparin level in the morning.   CBC remains stable.     Goal of Therapy:  Heparin level 0.3-0.7 units/ml aPTT 66-102 seconds Monitor platelets by anticoagulation protocol: Yes   Plan:  Continue heparin infusion at  1700 units/hr for aPTT= 69 msec Obtain daily aPTT and Heparin level  Monitor for signs/symptoms of bleeding  Despina Pole, Pharm. D. Clinical Pharmacist 05/27/2019 12:05 AM

## 2019-05-27 NOTE — Progress Notes (Signed)
Notified Dr Maryland Pink of decreased UOP from catheter as well as increase in sediment.  Concerned that catheter might be clogged.  MD ordered to flush with 60 mL sterile water.

## 2019-05-27 NOTE — Progress Notes (Signed)
Dylan Goodman Progress Note   Called back to see pt on 05/20/19. He is a 68 year old WM with ICM- EF 40-45%, Afib, DM and also CKD (recent best crt baseline around 2). He was hosp at [facility] from 9/14-9/19- discharged, then readmitted 10/6 to [facility]- S/p cardiac arrest on 10/7. He suffered A on CRF in that setting with crt peaking at 5.17 on 10/10, then improved slowly - renal service signed off. Transferred to the ICU for hypoxia and intubated 10/21 and treated with remdesevir again -> just extubated 10/24.   Assessment/ Plan:    Renal-Baseline CKD- crt 2- This is second event of A on CRF this hosp. Creat / Bun stable, no indication RRT yet. Per CCM feel by CT patient may be intravasc wet, IV lasix started 10/22. Then BUN/ Cr rose significantly so CRRT started 10/24.   - spoke w/ CCM. Attempting to take pressors off and CRRT off.  If no pressors needed overnight then will transfer to [facility]W for iHD - labs reviewed and I/O's, UOP 655 cc yesterday   SP cardiac arrest - on 10/07  Atrial fib w/ RVR - on po metoprolol and cardizem for rate control  Hypotension - started on midodrine 10 tid  COVID PNA/ underlying COPD/ vol excess- responded well to UF w/ crrt and to lasix and was extubated   Hyperkalemia- renal diet, lokelma x 2 days  Anemia-not a significant issue   CAD h/o CABG  DM2   Dylan Gwenevere Goga  MD 05/27/2019, 2:49 PM    Subjective:    Patient not examined directly given COVID-19 + status, utilizing exam of the primary team and observations of RN's.     Objective:   BP (!) 96/53   Pulse 88   Temp 98.1 F (36.7 C) (Axillary)   Resp 17   Ht 5\' 11"  (1.803 m)   Wt 86.7 kg   SpO2 91%   BMI 26.66 kg/m   Intake/Output Summary (Last 24 hours) at 05/27/2019 1449 Last data filed at 05/27/2019 1430 Gross per 24 hour  Intake 2057.31 ml  Output 754 ml  Net 1303.31 ml   Weight change: 0 kg  Physical Exam:  Patient not examined directly given  COVID-19 + status, utilizing exam of the primary team and observations of RN's.    Imaging: No results found.  Labs: BMET Recent Labs  Lab 05/24/19 0535 05/24/19 1500 05/25/19 0500 05/25/19 1540 05/26/19 0010 05/26/19 1530 05/27/19 0615  NA 147* 142 141 135 135 137 139  K 3.8 3.8 4.2 4.3 4.1 4.4 5.4*  CL 109 107 106 103 103 105 104  CO2 28 26 25 25 24 27 26   GLUCOSE 131* 115* 82 177* 94 108* 158*  BUN 95* 68* 51* 39* 33* 22 21  CREATININE 2.13* 1.68* 1.63* 1.42* 1.62* 1.20 1.33*  CALCIUM 8.3* 7.7* 8.2* 7.7* 8.2* 7.7* 8.1*  PHOS 3.1 3.1 2.6 2.3* 2.0* 2.9 3.3   CBC Recent Labs  Lab 05/24/19 0534 05/25/19 0500 05/26/19 0010 05/27/19 0400  WBC 18.3* 12.1* 10.7* 11.9*  HGB 14.4 12.5* 13.6 10.1*  HCT 48.9 42.3 45.6 34.6*  MCV 101.9* 101.7* 102.0* 102.4*  PLT 120* 113* 125* 109*    Medications:    . calcitRIOL  0.25 mcg Oral Daily  . Chlorhexidine Gluconate Cloth  6 each Topical Daily  . diltiazem  30 mg Oral Q6H  . feeding supplement (ENSURE ENLIVE)  237 mL Oral TID BM  . hydrocortisone sodium succinate  50 mg Intravenous Q6H  . insulin aspart  0-5 Units Subcutaneous QHS  . insulin aspart  0-9 Units Subcutaneous TID WC  . insulin glargine  12 Units Subcutaneous QHS  . Ipratropium-Albuterol  1 puff Inhalation Q6H  . levETIRAcetam  500 mg Oral BID  . Melatonin  6 mg Oral QHS  . metoprolol tartrate  25 mg Oral BID  . midodrine  10 mg Oral Q8H  . mometasone-formoterol  2 puff Inhalation BID  . pantoprazole  40 mg Oral Daily  . polyethylene glycol  17 g Oral BID  . senna  1 tablet Oral BID  . sodium chloride flush  3 mL Intravenous Q12H  . sodium zirconium cyclosilicate  10 g Oral BID

## 2019-05-27 NOTE — Progress Notes (Signed)
Physical Therapy Treatment Patient Details Name: Dylan Goodman MRN: [id number] DOB: November 17, 1950 Today's Date: 05/27/2019    History of Present Illness 68 y/o male treated for COVID pneumonia on September 13 returned to the ED on 10/7 with seizure and cardiac arrest.  Transitioned to floor where he had AKI which resolved but then later in his admission developed worsening respiratory failure and renal failure again.  Ultimately re-intubated on 10/21 and sent back to [facility] Extubated 10/24. On CRRT    PT Comments    Patient assisted to sitting on bed edge x 5 minutes, performed U/LE exercises. Patient's VSS, On CRRT. RN present to assist with line safety. Continue PT for progressive mobility.    Follow Up Recommendations  CIR     Equipment Recommendations  None recommended by PT    Recommendations for Other Services Rehab consult     Precautions / Restrictions Precautions Precautions: Fall Precaution Comments: watch O2, CRRT patient    Mobility  Bed Mobility   Bed Mobility: Rolling Rolling: Mod assist Sidelying to sit: Mod assist       General bed mobility comments: max A guide BLEs EOB and support at trunk, watching CRRT lines, extra time due to soreness of buttocks. use of bed pad to slide patient forward. assist with legs  back onto bed. RN monitoring lines.  Transfers                 General transfer comment: pt declined to stand  Ambulation/Gait                 Stairs             Wheelchair Mobility    Modified Rankin (Stroke Patients Only)       Balance Overall balance assessment: Needs assistance Sitting-balance support: Feet supported;Bilateral upper extremity supported Sitting balance-Leahy Scale: Fair Sitting balance - Comments: initially posterior lean, uncreased control to minguard, sat x 5 minutes. BP stable, on RA >90%, HR 115. BP 96/79, no dizziness reported.                                    Cognition  Arousal/Alertness: Awake/alert Behavior During Therapy: WFL for tasks assessed/performed                                          Exercises General Exercises - Upper Extremity Shoulder Flexion: AROM;AAROM;10 reps;Seated General Exercises - Lower Extremity Long Arc Quad: AROM;Both;10 reps Hip Flexion/Marching: AROM;Both;10 reps    General Comments        Pertinent Vitals/Pain Pain Assessment: Faces Faces Pain Scale: Hurts even more Pain Location: buttocks" rash" Pain Descriptors / Indicators: Discomfort;Grimacing Pain Intervention(s): Monitored during session;Limited activity within patient's tolerance    Home Living                      Prior Function            PT Goals (current goals can now be found in the care plan section)      Frequency    Min 3X/week      PT Plan Current plan remains appropriate    Co-evaluation              AM-PAC PT "6 Clicks" Mobility   Outcome Measure  Help  needed turning from your back to your side while in a flat bed without using bedrails?: A Lot Help needed moving from lying on your back to sitting on the side of a flat bed without using bedrails?: A Lot Help needed moving to and from a bed to a chair (including a wheelchair)?: Total Help needed standing up from a chair using your arms (e.g., wheelchair or bedside chair)?: Total Help needed to walk in hospital room?: Total Help needed climbing 3-5 steps with a railing? : Total 6 Click Score: 8    End of Session   Activity Tolerance: Patient tolerated treatment well Patient left: in bed;with call bell/phone within reach;with nursing/sitter in room Nurse Communication: Mobility status PT Visit Diagnosis: Unsteadiness on feet (R26.81);Other abnormalities of gait and mobility (R26.89);Muscle weakness (generalized) (M62.81);Difficulty in walking, not elsewhere classified (R26.2)     Time: 7737-3668 PT Time Calculation (min) (ACUTE ONLY): 26  min  Charges:  $Therapeutic Activity: 23-37 mins                     Tresa Endo PT Acute Rehabilitation Services  Office [phone]    Claretha Cooper 05/27/2019, 4:41 PM

## 2019-05-27 NOTE — Progress Notes (Signed)
NAME:  Dylan Goodman, MRN:  [id number], DOB:  08-Jan-1951, LOS: 1 ADMISSION DATE:  06-02-19, CONSULTATION DATE:  Jun 02, 2023 REFERRING MD:  Reino Kent EDP, CHIEF COMPLAINT:  Cardiac arrest   Brief History   68 y/o male treated for COVID pneumonia on September 13 returned to the ED on 2023-06-02 with seizure and cardiac arrest.  Transitioned to floor where he had AKI which resolved but then later in his admission developed worsening respiratory failure and renal failure again.  Ultimately re-intubated on 10/21 and sent back to [facility]   Past Medical History  Systolic heart failure LVEF 40-45% Atrial fib GI bleed CAD Depression CKD DM2 Gastric ulcer GERD CABG Iron def anemia Skin cancer  Significant Hospital Events   9/14-9/19 admit to [facility] for COVID pneumonia.  10/05 to ED for Chest pain. Discharged home after negative cardiac. 10/06 To ED for seizure 06-02-23 Coded in ED while awaiting placement 10/15 PCCM called back  10/17 Remains on 100%, 30L flow 10/20 placed back on remdesevir, when attempting to transfer to [facility] desaturated. Moved to ICU 10/21 renal fxn worse  10/24 extubated, started on CVVHD  Consults:  Neurology Cardiology Nephrology PCCM  Procedures:  ETT 06-02-23 >> 10/8 ETT 10/21 >> 10/24 R IJ HD Cath 10/21 >>  Significant Diagnostic Tests:  Renal US 9/14 >> WNL Lower extremity doppler 02-Jun-2023 >> CT head 10/6 >> neg CXR 10/15 >> ILD, possible some volume overload Echo 10/9 >> LVEF 45-50%, global normal RV systolic function, LA severely dilated, RA moderately dilated, mild MVR, mild TR, moderately elevated pulmonary artery systolic pressure 56/21 CT chest images personally reviewed: underlying emphysema, bilateral effusion, cardiomegally, bilateral ground glass and consolidation  Micro Data:  06/02/23 blood > neg 10/8 resp > opf 06-02-2023 urine > neg  Antimicrobials/COVID RX  10/20 cefepime > 10/22 10/20 remdesivir >   Interim history/subjective:   No events  overnight, feels better Remains on neo  Objective   Blood pressure 103/84, pulse 97, temperature (!) 97.4 F (36.3 C), temperature source Oral, resp. rate (!) 21, height 5\' 11"  (1.803 m), weight 86.7 kg, SpO2 98 %.  Intake/Output Summary (Last 24 hours) at 05/27/2019 0801 Last data filed at 05/27/2019 0740 Gross per 24 hour  Intake 1837.29 ml  Output 763 ml  Net 1074.29 ml   Filed Weights   05/25/19 0500 05/26/19 0500 05/27/19 0132  Weight: 87.2 kg 86.7 kg 86.7 kg   Examination:  General:  Chronically ill appearing male, NAD HENT: [place]/AT, PERRL, EOM-I and MMM PULM: Bibasilar crackles CV: RRR, Nl S1/S2 and -M/R/G GI: Soft, NT, ND and +BS MSK: WNL Neuro: Alert and interactive, moving all ext to command  I reviewed CXR myself, infiltrate noted  Resolved Hospital Problem list     Assessment & Plan:  AKI: appears dry/euvolemic on physical exam but lungs look wet on CT chest from 10/19 (pleural effusions, pulmonary hypertension, interlobular septal thickening) CRRT through today per renal, will need transfer to [facility] for standard HD if able to come off pressors Continue CRRT as ordered with volume negative as BP allows Monitor UOP  Acute respiratory failure with hypoxemia: significantly improved, unclear if pulmonary edema or inflammation, suspect more the former Continue stress dose steroids Administer O2 to maintain O2 saturation > 85%, attempt to get off HFNC to standard [place]  Underlying emphysema Dulera bid Combivent qid  Seizure: generalized tonic clonic seizure on admission, none since Discussed with neuro 10/25, they recommend continued treatment Keppra 500mg  bid Outpatient neuro follow  up  Systolic heart failure: lungs appear wet to me on chest imaging Decrease lopressor to 25 BID metoprolol If becomes tachy again will bolus with amiodarone  Hypotension in setting of systolic heart failure, multiple drugs for rate control Continue midodrine to 10 q8 Stress dose  steroids as ordered Continue neosynephrine titrated for MAP > 55, SBP > 90  Atrial fib Tele Metoprolol Amiodarone Plan as above  Spoke with renal.  Attempting to take pressors off and CRRT off.  If no pressors needed overnight then will transfer to [facility] 2W for iHD.  This was discussed with [facility]-MD and nephrology.  PCCM will sign off, please call back if needed.  Best practice:  Diet: tube feeding Pain/Anxiety/Delirium protocol (if indicated): d/c today  VAP protocol (if indicated): d/c DVT prophylaxis: eliquis GI prophylaxis: famotidine, renal dosing Glucose control: per [facility] Mobility: bed rest Code Status: full Family Communication: will discuss who will call family with [facility] Disposition: remain in ICU  Labs   CBC: Recent Labs  Lab 05/23/19 0550 05/24/19 0534 05/25/19 0500 05/26/19 0010 05/27/19 0400  WBC 14.1* 18.3* 12.1* 10.7* 11.9*  HGB 12.0* 14.4 12.5* 13.6 10.1*  HCT 39.3 48.9 42.3 45.6 34.6*  MCV 101.0* 101.9* 101.7* 102.0* 102.4*  PLT 112* 120* 113* 125* 109*    Basic Metabolic Panel: Recent Labs  Lab 05/21/19 1615 05/21/19 1720  05/24/19 0534  05/24/19 1500 05/25/19 0500 05/25/19 1540 05/26/19 0010 05/26/19 1530  NA  --   --    < > 145   < > 142 141 135 135 137  K  --   --    < > 3.7   < > 3.8 4.2 4.3 4.1 4.4  CL  --   --    < > 107   < > 107 106 103 103 105  CO2  --   --    < > 27   < > 26 25 25 24 27   GLUCOSE  --   --    < > 130*   < > 115* 82 177* 94 108*  BUN  --   --    < > 97*   < > 68* 51* 39* 33* 22  CREATININE  --   --    < > 2.18*   < > 1.68* 1.63* 1.42* 1.62* 1.20  CALCIUM  --   --    < > 8.3*   < > 7.7* 8.2* 7.7* 8.2* 7.7*  MG 2.3 2.1  --  2.1  --   --  2.2  --  2.2  --   PHOS 4.9* 5.2*  --   --    < > 3.1 2.6 2.3* 2.0* 2.9   < > = values in this interval not displayed.   GFR: Estimated Creatinine Clearance: 65.4 mL/min (by C-G formula based on SCr of 1.2 mg/dL). Recent Labs  Lab 05/24/19 0534 05/25/19 0500 05/26/19 0010 05/27/19  0400  WBC 18.3* 12.1* 10.7* 11.9*    Liver Function Tests: Recent Labs  Lab 05/21/19 0510 05/22/19 0400 05/23/19 0550 05/24/19 0534  05/24/19 1500 05/25/19 0500 05/25/19 1540 05/26/19 0010 05/26/19 1530  AST 46* 74* 75* 60*  --   --   --   --   --   --   ALT 105* 104* 122* 121*  --   --   --   --   --   --   ALKPHOS 149* 190* 202* 235*  --   --   --   --   --   --  BILITOT 1.6* 1.3* 1.1 2.0*  --   --   --   --   --   --   PROT 5.9* 4.8* 4.9* 5.8*  --   --   --   --   --   --   ALBUMIN 2.9* 2.4* 2.2* 2.6*   < > 2.4* 2.4* 2.2* 2.8* 2.0*   < > = values in this interval not displayed.   No results for input(s): LIPASE, AMYLASE in the last 168 hours. No results for input(s): AMMONIA in the last 168 hours.  ABG    Component Value Date/Time   PHART 7.473 (H) 05/23/2019 0406   PCO2ART 34.9 05/23/2019 0406   PO2ART 106.0 05/23/2019 0406   HCO3 25.7 05/23/2019 0406   TCO2 27 05/23/2019 0406   ACIDBASEDEF 2.9 (H) 05/14/2019 0625   O2SAT 99.0 05/23/2019 0406     Coagulation Profile: No results for input(s): INR, PROTIME in the last 168 hours.  Cardiac Enzymes: No results for input(s): CKTOTAL, CKMB, CKMBINDEX, TROPONINI in the last 168 hours.  HbA1C: Hgb A1c MFr Bld  Date/Time Value Ref Range Status  03/12/2019 07:14 AM 6.7 (H) 4.8 - 5.6 % Final    Comment:    (NOTE) Pre diabetes:          5.7%-6.4% Diabetes:              >6.4% Glycemic control for   <7.0% adults with diabetes   09/08/2018 12:03 PM 6.3 (H) <5.7 % of total Hgb Final    Comment:    For someone without known diabetes, a hemoglobin  A1c value between 5.7% and 6.4% is consistent with prediabetes and should be confirmed with a  follow-up test. . For someone with known diabetes, a value <7% indicates that their diabetes is well controlled. A1c targets should be individualized based on duration of diabetes, age, comorbid conditions, and other considerations. . This assay result is consistent with an  increased risk of diabetes. . Currently, no consensus exists regarding use of hemoglobin A1c for diagnosis of diabetes for children. .     CBG: Recent Labs  Lab 05/26/19 1113 05/26/19 1300 05/26/19 1536 05/26/19 2143 05/27/19 0644  GLUCAP 125* 90 88 157* 147*   The patient is critically ill with multiple organ systems failure and requires high complexity decision making for assessment and support, frequent evaluation and titration of therapies, application of advanced monitoring technologies and extensive interpretation of multiple databases.   Critical Care Time devoted to patient care services described in this note is  31  Minutes. This time reflects time of care of this signee Dr Jennet Maduro. This critical care time does not reflect procedure time, or teaching time or supervisory time of PA/NP/Med student/Med Resident etc but could involve care discussion time.  Rush Farmer, M.D. [facility] Pulmonary/Critical Care Medicine.

## 2019-05-27 NOTE — Plan of Care (Signed)

## 2019-05-27 NOTE — Progress Notes (Signed)
Pt video chatted with wife via cell phone this am.

## 2019-05-27 NOTE — Progress Notes (Signed)
PROGRESS NOTE  Dylan Goodman YJE:[id number] DOB: 30-Dec-1950 DOA: 25-May-2019  PCP: Steele Sizer, MD  Brief History/Interval Summary: 68 year old with a history of atrial fibrillation, GIB, CAD status post CABG, nasopharyngeal cancer, CKD, DM, and GERD who was previously treated for Covid pneumonia 9/17 and returned to the [facility] ED with reported seizure-like behavior, and subsequently suffered a cardiac arrest.  He was intubated and transferred to [facility] ICU.  Reason for Visit: Pneumonia due to COVID-19.  Acute respiratory failure with hypoxia.  Consultants: Pulmonology.  Nephrology.  Significant Events: 9/14 > 9/19 admitted to the [facility] for Covid pneumonia 10/5 ED evaluation with chest pain -discharged home 10/6 ED evaluation for seizure -CT head negative for acute findings 25-May-2023 coded while in ED awaiting placement -intubated 10/8 extubated -transferred out of ICU 10/15 hypoxia requiring PCCM consultation 10/19 high resolution CT -cardiomegaly, small pleural effusions, extensive patchy consolidation from last opacities -moderate to severe emphysema 10/21 intubation -vasc cath placement -transfer to [facility] 10/24 extubated  Procedures:   Antibiotics: Anti-infectives (From admission, onward)   Start     Dose/Rate Route Frequency Ordered Stop   05/20/19 2320  ceFEPIme (MAXIPIME) 2 g in sodium chloride 0.9 % 100 mL IVPB  Status:  Discontinued     2 g 200 mL/hr over 30 Minutes Intravenous Every 24 hours 05/20/19 0840 05/21/19 1227   05/20/19 1000  remdesivir 100 mg in sodium chloride 0.9 % 250 mL IVPB     100 mg 500 mL/hr over 30 Minutes Intravenous Every 24 hours 05/19/19 0837 05/23/19 1050   05/19/19 1000  ceFEPIme (MAXIPIME) 2 g in sodium chloride 0.9 % 100 mL IVPB  Status:  Discontinued     2 g 200 mL/hr over 30 Minutes Intravenous Every 12 hours 05/19/19 0837 05/20/19 0840   05/19/19 1000  remdesivir 200 mg in sodium chloride 0.9 % 250 mL IVPB     200 mg 500  mL/hr over 30 Minutes Intravenous Once 05/19/19 0837 05/19/19 1241   05/09/19 1000  piperacillin-tazobactam (ZOSYN) IVPB 2.25 g  Status:  Discontinued     2.25 g 100 mL/hr over 30 Minutes Intravenous Every 8 hours 05/09/19 0833 05/10/19 1346   05/08/19 1000  piperacillin-tazobactam (ZOSYN) IVPB 3.375 g  Status:  Discontinued     3.375 g 12.5 mL/hr over 240 Minutes Intravenous Every 12 hours 05/08/19 0930 05/09/19 0833   05/08/19 0630  piperacillin-tazobactam (ZOSYN) IVPB 3.375 g  Status:  Discontinued     3.375 g 12.5 mL/hr over 240 Minutes Intravenous Every 12 hours 05/07/19 2040 05/08/19 0930   05/07/19 2200  vancomycin (VANCOCIN) 1,750 mg in sodium chloride 0.9 % 500 mL IVPB  Status:  Discontinued     1,750 mg 250 mL/hr over 120 Minutes Intravenous Every 48 hours 2019-05-25 1912 05/07/19 1039   05/07/19 2200  vancomycin (VANCOCIN) 1,500 mg in sodium chloride 0.9 % 500 mL IVPB  Status:  Discontinued     1,500 mg 250 mL/hr over 120 Minutes Intravenous Every 48 hours 05/07/19 1039 05/07/19 1228   05/07/19 0200  piperacillin-tazobactam (ZOSYN) IVPB 3.375 g  Status:  Discontinued     3.375 g 12.5 mL/hr over 240 Minutes Intravenous Every 8 hours 2019-05-25 1853 05/07/19 2040   2019/05/25 1930  piperacillin-tazobactam (ZOSYN) IVPB 3.375 g     3.375 g 100 mL/hr over 30 Minutes Intravenous  Once 05/25/2019 1853 May 25, 2019 2156      Subjective/Interval History: Patient not very communicative.  However he does say that he  does not have any pain anywhere.  Shortness of breath persists but has improved.    Assessment/Plan:  Acute Hypoxic Resp. Failure/Pneumonia due to COVID-19   Lab Results  Component Value Date   [place] (A) 05/04/2019   [place] (A) 04/12/2019   [place] Not Detected 04/02/2019   [place] NEGATIVE 03/12/2019    Patient remained stable from a respiratory standpoint.  He is requiring 2 L of oxygen by nasal cannula.  Saturating in the early 90s.   Patient has completed course of remdesivir.  He was extubated on 10/24.  Seems to be stable for the most part. Patient encouraged to stay in prone position as much as possible.  Incentive spirometer.  Mobilization.  PT and OT evaluation.  CIR recommended by physical therapy.  Hypotension/possible adrenal insufficiency Patient was requiring pressors.  Could have been due to CRRT.  Patient was started on Neo-Synephrine infusion.  Being weaned down.  He is on midodrine.  He was also started back on stress dose steroids.  His random cortisol level was 6.3.  Acute renal failure on chronic kidney disease stage IV/uremia/hyperkalemia Patient on CRRT.  Nephrology is following.  Plan is to transition to intermittent hemodialysis soon.  Hopefully as early as tomorrow if he remains off of pressors for the next 24 hours.  Lokelma ordered for elevated potassium level.  Chronic atrial fibrillation with acute RVR Patient is chronically on Eliquis.  Currently on heparin infusion.  Patient started back on low-dose metoprolol today.  He is also on short acting Cardizem.  Blood pressure remains an issue then may have to consider amiodarone.  Heart rate in the 100-120 range.  Diffuse ST depression noted on EKG This is most likely rate related as that EKG did show that he was tachycardic.  Patient was asymptomatic.  Benefit from repeat EKG which has been ordered but does not appear to have been done.  Not noted on epic.  Will order for tomorrow morning.  Acute on chronic systolic CHF Echocardiogram from 10/9 showed a EF of 45 to 50%.  Volume being managed by CRRT.  Does not appear to be overtly volume overloaded.  He does have some edema in his extremities though.  Single episode of seizure Likely related to cardiac arrest.  He is on Keppra.  No further occurrences.  Diabetes mellitus type 2 CBG appears to be reasonably well controlled.  HbA1c 6.7 in August.  Transaminitis Likely related to COVID-19.  Could also  be due to shock liver.  Counts are stable.  Continue to monitor periodically.  Deconditioning PT and OT is following.  CIR is recommended.  Macrocytosis Vitamin I33 and folic acid levels were checked and not found to be deficient.  Pressure injury Pressure Injury 05/25/19 Sacrum Bilateral Stage I -  Intact skin with non-blanchable redness of a localized area usually over a bony prominence. (Active)  05/25/19 2000  Location: Sacrum  Location Orientation: Bilateral  Staging: Stage I -  Intact skin with non-blanchable redness of a localized area usually over a bony prominence.  Wound Description (Comments):   Present on Admission:    DVT Prophylaxis: Heparin infusion PUD Prophylaxis: Start famotidine Code Status: Full code Family Communication: We will discuss with family Disposition Plan: Remain in ICU for today.  Possible transfer to [facility] tomorrow.   Medications:  Scheduled: . calcitRIOL  0.25 mcg Oral Daily  . Chlorhexidine Gluconate Cloth  6 each Topical Daily  . diltiazem  30 mg Oral Q6H  .  feeding supplement (ENSURE ENLIVE)  237 mL Oral TID BM  . hydrocortisone sodium succinate  50 mg Intravenous Q6H  . insulin aspart  0-5 Units Subcutaneous QHS  . insulin aspart  0-9 Units Subcutaneous TID WC  . insulin glargine  12 Units Subcutaneous QHS  . Ipratropium-Albuterol  1 puff Inhalation Q6H  . levETIRAcetam  500 mg Oral BID  . Melatonin  6 mg Oral QHS  . metoprolol tartrate  100 mg Oral BID  . midodrine  10 mg Oral Q8H  . mometasone-formoterol  2 puff Inhalation BID  . pantoprazole  40 mg Oral Daily  . polyethylene glycol  17 g Oral BID  . senna  1 tablet Oral BID  . sodium chloride flush  3 mL Intravenous Q12H   Continuous: .  prismasol BGK 4/2.5 400 mL/hr at 05/27/19 0304  .  prismasol BGK 4/2.5 200 mL/hr at 05/26/19 2345  . sodium chloride 250 mL (05/26/19 2048)  . heparin 1,700 Units/hr (05/27/19 0800)  . phenylephrine (NEO-SYNEPHRINE) Adult  infusion 10 mcg/min (05/27/19 0800)  . prismasol BGK 4/2.5 1,500 mL/hr at 05/27/19 0619   RCV:ELFYBO chloride, acetaminophen, albuterol, heparin, hydrOXYzine, sodium chloride, sodium chloride flush, zolpidem   Objective:  Vital Signs  Vitals:   05/27/19 0745 05/27/19 0800 05/27/19 0815 05/27/19 0830  BP: 103/84 101/72 120/74 94/63  Pulse: 97 (!) 109 (!) 106   Resp: (!) 21 (!) 22 (!) 24 (!) 26  Temp:  (!) 97.4 F (36.3 C)    TempSrc:  Oral    SpO2: 98% 93% 92% 93%  Weight:      Height:        Intake/Output Summary (Last 24 hours) at 05/27/2019 0856 Last data filed at 05/27/2019 0845 Gross per 24 hour  Intake 1988.64 ml  Output 770 ml  Net 1218.64 ml   Filed Weights   05/25/19 0500 05/26/19 0500 05/27/19 0132  Weight: 87.2 kg 86.7 kg 86.7 kg    General appearance: Awake alert.  In no distress.  Somewhat distracted. Resp: Effort at rest.  Coarse breath sounds with crackles at the bases.  No wheezing or rhonchi. Cardio: S1-S2 is normal regular.  No S3-S4.  No rubs murmurs or bruit GI: Abdomen is soft.  Nontender nondistended.  Bowel sounds are present normal.  No masses organomegaly Extremities: edema bilateral lower extremities Neurologic: Alert.  No obvious focal neurological deficits.   Lab Results:  Data Reviewed: I have personally reviewed following labs and imaging studies  CBC: Recent Labs  Lab 05/23/19 0550 05/24/19 0534 05/25/19 0500 05/26/19 0010 05/27/19 0400  WBC 14.1* 18.3* 12.1* 10.7* 11.9*  HGB 12.0* 14.4 12.5* 13.6 10.1*  HCT 39.3 48.9 42.3 45.6 34.6*  MCV 101.0* 101.9* 101.7* 102.0* 102.4*  PLT 112* 120* 113* 125* 109*    Basic Metabolic Panel: Recent Labs  Lab 05/21/19 1720  05/24/19 0534  05/25/19 0500 05/25/19 1540 05/26/19 0010 05/26/19 1530 05/27/19 0615  NA  --    < > 145   < > 141 135 135 137 139  K  --    < > 3.7   < > 4.2 4.3 4.1 4.4 5.4*  CL  --    < > 107   < > 106 103 103 105 104  CO2  --    < > 27   < > 25 25 24 27  26   GLUCOSE  --    < > 130*   < > 82 177* 94 108* 158*  BUN  --    < > 97*   < > 51* 39* 33* 22 21  CREATININE  --    < > 2.18*   < > 1.63* 1.42* 1.62* 1.20 1.33*  CALCIUM  --    < > 8.3*   < > 8.2* 7.7* 8.2* 7.7* 8.1*  MG 2.1  --  2.1  --  2.2  --  2.2  --  2.1  PHOS 5.2*  --   --    < > 2.6 2.3* 2.0* 2.9 3.3   < > = values in this interval not displayed.    GFR: Estimated Creatinine Clearance: 59 mL/min (A) (by C-G formula based on SCr of 1.33 mg/dL (H)).  Liver Function Tests: Recent Labs  Lab 05/21/19 0510 05/22/19 0400 05/23/19 0550 05/24/19 0534  05/25/19 1540 05/26/19 0010 05/26/19 1530 05/27/19 0400 05/27/19 0615  AST 46* 74* 75* 60*  --   --   --   --  49*  --   ALT 105* 104* 122* 121*  --   --   --   --  78*  --   ALKPHOS 149* 190* 202* 235*  --   --   --   --  320*  --   BILITOT 1.6* 1.3* 1.1 2.0*  --   --   --   --  1.3*  --   PROT 5.9* 4.8* 4.9* 5.8*  --   --   --   --  4.7*  --   ALBUMIN 2.9* 2.4* 2.2* 2.6*   < > 2.2* 2.8* 2.0* 2.1* 2.3*   < > = values in this interval not displayed.     CBG: Recent Labs  Lab 05/26/19 1300 05/26/19 1536 05/26/19 2143 05/27/19 0644 05/27/19 0752  GLUCAP 90 88 157* 147* 140*     Anemia Panel: Recent Labs    05/26/19 0010  VITAMINB12 1,075*  FOLATE 6.4  FERRITIN 874*  TIBC 197*  IRON 110  RETICCTPCT 2.3       Radiology Studies: No results found.     LOS: 21 days   Ceairra Mccarver [employer] on [email or web address]  05/27/2019, 8:56 AM

## 2019-05-27 NOTE — Progress Notes (Signed)
ANTICOAGULATION CONSULT NOTE - Follow Up Consult  Pharmacy Consult for Heparin Indication: atrial fibrillation  No Known Allergies  Patient Measurements: Height: 5\' 11"  (180.3 cm) Weight: 191 lb 2.2 oz (86.7 kg) IBW/kg (Calculated) : 75.3 Heparin Dosing Weight: 86.7 kg  Vital Signs: Temp: 97.4 F (36.3 C) (10/28 0800) Temp Source: Oral (10/28 0800) BP: 94/63 (10/28 0830) Pulse Rate: 106 (10/28 0815)  Labs: Recent Labs    05/24/19 1845  05/25/19 0500  05/26/19 0010 05/26/19 0650 05/26/19 1030 05/26/19 1530 05/26/19 2230 05/27/19 0400 05/27/19 0615  HGB  --    < > 12.5*  --  13.6  --   --   --   --  10.1*  --   HCT  --   --  42.3  --  45.6  --   --   --   --  34.6*  --   PLT  --   --  113*  --  125*  --   --   --   --  109*  --   APTT  --    < >  --    < > 56*  --  63*  --  69*  --  86*  HEPARINUNFRC  --   --   --    < > 0.87*  --  0.63  --   --   --  0.68  CREATININE  --   --  1.63*   < > 1.62*  --   --  1.20  --   --  1.33*  TROPONINIHS 774*  --   --   --   --  2,883*  --   --   --   --  1,611*   < > = values in this interval not displayed.    Estimated Creatinine Clearance: 59 mL/min (A) (by C-G formula based on SCr of 1.33 mg/dL (H)).   Medications:  Infusions:  .  prismasol BGK 4/2.5 400 mL/hr at 05/27/19 0304  .  prismasol BGK 4/2.5 200 mL/hr at 05/26/19 2345  . sodium chloride 250 mL (05/26/19 2048)  . heparin 1,700 Units/hr (05/27/19 0800)  . phenylephrine (NEO-SYNEPHRINE) Adult infusion 10 mcg/min (05/27/19 0800)  . prismasol BGK 4/2.5 1,500 mL/hr at 05/27/19 4562    Assessment: 68 yo male previously hospitalized for COVID pneumonia 9/14-9/19 then returned for seizure-like activity and subsequent cardiac arrest 10/7.  On chronic apixaban for afib. Starting CRRT 10/24 for AKI with worsening uremia.  Pharmacy consulted to convert apixaban to IV heparin.  Last dose of apixaban given 10/24 at 09:23 AM.    APTT remains therapeutic at 86 on 1700 units/hr.  Heparin level at higher end of therapeutic range at 0.68, likely still affected by recent apixaban. Hgb dropped from 13.6 to 10.1 today, platelets down to 109. No overt bleeding or infusion issues noted. Will continue current rate and check both aPTT and HL until levels fully correlating.  Goal of Therapy:  Heparin level 0.3-0.7 units/ml aPTT 66-102 seconds Monitor platelets by anticoagulation protocol: Yes   Plan:  Continue heparin infusion at 1700 units/hr Obtain daily aPTT and Heparin level  Monitor for signs/symptoms of bleeding  Richardine Service, PharmD PGY1 Pharmacy Resident 05/27/2019  8:51 AM

## 2019-05-27 NOTE — Progress Notes (Signed)
Pt on phone with wife Mardene Celeste.

## 2019-05-28 DIAGNOSIS — U071 COVID-19: Secondary | ICD-10-CM | POA: Diagnosis not present

## 2019-05-28 DIAGNOSIS — N179 Acute kidney failure, unspecified: Secondary | ICD-10-CM | POA: Diagnosis not present

## 2019-05-28 DIAGNOSIS — J810 Acute pulmonary edema: Secondary | ICD-10-CM | POA: Diagnosis not present

## 2019-05-28 DIAGNOSIS — J9601 Acute respiratory failure with hypoxia: Secondary | ICD-10-CM | POA: Diagnosis not present

## 2019-05-28 LAB — CBC
HCT: 35.1 % — ABNORMAL LOW (ref 39.0–52.0)
Hemoglobin: 10.5 g/dL — ABNORMAL LOW (ref 13.0–17.0)
MCH: 30.6 pg (ref 26.0–34.0)
MCHC: 29.9 g/dL — ABNORMAL LOW (ref 30.0–36.0)
MCV: 102.3 fL — ABNORMAL HIGH (ref 80.0–100.0)
Platelets: 103 10*3/uL — ABNORMAL LOW (ref 150–400)
RBC: 3.43 MIL/uL — ABNORMAL LOW (ref 4.22–5.81)
RDW: 19.9 % — ABNORMAL HIGH (ref 11.5–15.5)
WBC: 12.5 10*3/uL — ABNORMAL HIGH (ref 4.0–10.5)
nRBC: 0 % (ref 0.0–0.2)

## 2019-05-28 LAB — RENAL FUNCTION PANEL
Albumin: 2.4 g/dL — ABNORMAL LOW (ref 3.5–5.0)
Anion gap: 7 (ref 5–15)
BUN: 20 mg/dL (ref 8–23)
CO2: 27 mmol/L (ref 22–32)
Calcium: 8.1 mg/dL — ABNORMAL LOW (ref 8.9–10.3)
Chloride: 102 mmol/L (ref 98–111)
Creatinine, Ser: 1.26 mg/dL — ABNORMAL HIGH (ref 0.61–1.24)
GFR calc Af Amer: >60 mL/min (ref >60)
GFR calc non Af Amer: 59 mL/min — ABNORMAL LOW (ref >60)
Glucose, Bld: 150 mg/dL — ABNORMAL HIGH (ref 70–99)
Phosphorus: 2.4 mg/dL — ABNORMAL LOW (ref 2.5–4.6)
Potassium: 3.8 mmol/L (ref 3.5–5.1)
Sodium: 136 mmol/L (ref 135–145)

## 2019-05-28 LAB — PHOSPHORUS: Phosphorus: 2.4 mg/dL — ABNORMAL LOW (ref 2.5–4.6)

## 2019-05-28 LAB — GLUCOSE, CAPILLARY
Glucose-Capillary: 133 mg/dL — ABNORMAL HIGH (ref 70–99)
Glucose-Capillary: 166 mg/dL — ABNORMAL HIGH (ref 70–99)
Glucose-Capillary: 137 mg/dL — ABNORMAL HIGH (ref 70–99)
Glucose-Capillary: 187 mg/dL — ABNORMAL HIGH (ref 70–99)

## 2019-05-28 LAB — HEPARIN LEVEL (UNFRACTIONATED)
Heparin Unfractionated: 0.76 IU/mL — ABNORMAL HIGH (ref 0.30–0.70)
Heparin Unfractionated: 0.71 IU/mL — ABNORMAL HIGH (ref 0.30–0.70)

## 2019-05-28 LAB — APTT: aPTT: 123 seconds — ABNORMAL HIGH (ref 24–36)

## 2019-05-28 LAB — MAGNESIUM: Magnesium: 2.1 mg/dL (ref 1.7–2.4)

## 2019-05-28 MED ORDER — HEPARIN (PORCINE) 25000 UT/250ML-% IV SOLN
1400.0000 [IU]/h | INTRAVENOUS | Status: DC
  Administered 2019-05-28: 1500 [IU]/h via INTRAVENOUS
  Filled 2019-05-28: qty 250

## 2019-05-28 MED ORDER — SENNA 8.6 MG PO TABS
1.0000 | ORAL_TABLET | Freq: Every day | ORAL | Status: DC | PRN
  Filled 2019-05-28: qty 1

## 2019-05-28 MED ORDER — PHENYLEPHRINE HCL-NACL 40-0.9 MG/250ML-% IV SOLN: 0.0000 ug/min | INTRAVENOUS | Status: DC

## 2019-05-28 MED ORDER — SODIUM CHLORIDE 0.9 % IV BOLUS
500.0000 mL | Freq: Once | INTRAVENOUS | Status: AC
  Administered 2019-05-28: 250 mL via INTRAVENOUS

## 2019-05-28 MED ORDER — MIDODRINE HCL 5 MG PO TABS
10.0000 mg | ORAL_TABLET | Freq: Three times a day (TID) | ORAL | Status: DC
  Administered 2019-05-28 – 2019-06-01 (×13): 10 mg via ORAL
  Filled 2019-05-28 (×15): qty 2

## 2019-05-28 MED ORDER — POLYETHYLENE GLYCOL 3350 17 G PO PACK
17.0000 g | PACK | Freq: Every day | ORAL | Status: DC | PRN
  Administered 2019-06-01: 17 g via ORAL
  Filled 2019-05-28: qty 1

## 2019-05-28 MED ORDER — AMIODARONE HCL 200 MG PO TABS
200.0000 mg | ORAL_TABLET | Freq: Two times a day (BID) | ORAL | Status: DC
  Administered 2019-05-28 – 2019-06-05 (×17): 200 mg via ORAL
  Filled 2019-05-28 (×17): qty 1

## 2019-05-28 NOTE — Progress Notes (Signed)
NAME:  Dylan Goodman, MRN:  [id number], DOB:  12-27-1950, LOS: 105 ADMISSION DATE:  08-May-2019, CONSULTATION DATE:  05/08/2023 REFERRING MD:  Reino Kent EDP, CHIEF COMPLAINT:  Cardiac arrest   Brief History   68 y/o male treated for COVID pneumonia on September 13 returned to the ED on May 08, 2023 with seizure and cardiac arrest.  Transitioned to floor where he had AKI which resolved but then later in his admission developed worsening respiratory failure and renal failure again.  Ultimately re-intubated on 10/21 and sent back to [facility].   Past Medical History  Systolic heart failure LVEF 40-45% Atrial fib GI bleed CAD Depression CKD DM2 Gastric ulcer GERD CABG Iron def anemia Skin cancer  Significant Hospital Events   9/14-9/19 admit to [facility] for COVID pneumonia.  10/05 to ED for Chest pain. Discharged home after negative cardiac. 10/06 To ED for seizure 2023/05/08 Coded in ED while awaiting placement 10/15 PCCM called back  10/17 Remains on 100%, 30L flow 10/20 placed back on remdesevir, when attempting to transfer to [facility] desaturated. Moved to ICU 10/21 renal fxn worse  10/24 extubated, started on CVVHD  Consults:  Neurology Cardiology Nephrology PCCM  Procedures:  ETT 05/08/23 >> 10/8 ETT 10/21 >> 10/24 R IJ HD Cath 10/21 >>  Significant Diagnostic Tests:  Renal US 9/14 >> WNL Lower extremity doppler May 08, 2023 >> CT head 10/6 >> neg CXR 10/15 >> ILD, possible some volume overload Echo 10/9 >> LVEF 45-50%, global normal RV systolic function, LA severely dilated, RA moderately dilated, mild MVR, mild TR, moderately elevated pulmonary artery systolic pressure 62/70 CT chest images personally reviewed: underlying emphysema, bilateral effusion, cardiomegally, bilateral ground glass and consolidation  Micro Data:  2023/05/08 blood > neg 10/8 resp > opf 2023/05/08 urine > neg  Antimicrobials/COVID RX  10/20 cefepime > 10/22 10/20 remdesivir >   Interim history/subjective:   Patient became  hypotensive again and required neo again overnight, now off  Objective   Blood pressure 114/77, pulse (!) 103, temperature 98 F (36.7 C), temperature source Oral, resp. rate 20, height 5\' 11"  (1.803 m), weight 85.2 kg, SpO2 (!) 86 %.  Intake/Output Summary (Last 24 hours) at 05/28/2019 0855 Last data filed at 05/28/2019 0800 Gross per 24 hour  Intake 1401.79 ml  Output 621 ml  Net 780.79 ml   Filed Weights   05/26/19 0500 05/27/19 0132 05/28/19 0500  Weight: 86.7 kg 86.7 kg 85.2 kg   Examination:  General:  Chronically ill appearing male, NAD HENT: [place]/AT, PERRL, EOM-I and MMM PULM: Bibasilar crackles CV: IRIR, Nl S1/S2 and -M/R/G GI: Soft, NT, ND and +BS MSK: WNL Neuro: Alert and interactive, moving all ext to command  I reviewed CXR myself, infiltrate noted  Discussed with TRH-MD  Resolved Hospital Problem list     Assessment & Plan:  AKI: appears dry/euvolemic on physical exam but lungs look wet on CT chest from 10/19 (pleural effusions, pulmonary hypertension, interlobular septal thickening) iHD per renal Attempt to keep of neo  Acute respiratory failure with hypoxemia: significantly improved, unclear if pulmonary edema or inflammation, suspect more the former Maintain on stress dose Administer O2 to maintain O2 saturation > 85%, attempt to get off HFNC to standard [place]  Underlying emphysema Dulera bid Combivent qid  Seizure: generalized tonic clonic seizure on admission, none since Discussed with neuro 10/25, they recommend continued treatment Keppra 500mg  bid Outpatient neuro follow up  Systolic heart failure: lungs appear wet to me on chest imaging Maintain on lopressor  25 BID for now given HR If becomes tachy again will bolus with amiodarone  Hypotension in setting of systolic heart failure, multiple drugs for rate control Continue midodrine to 10 q8 Stress dose steroids as ordered, no taper D/C Neo and accept SBP of 80 as long as patient is mentating   Atrial fib Tele Metoprolol Amiodarone Plan as above  Discussed with renal, ok for iHD in [facility] if BP remains stable today, will re-evaluate in the afternoon for transfer  If patient moves out of [place] then PCCM will sign off and defer to [facility] in [facility]  Best practice:  Diet: tube feeding Pain/Anxiety/Delirium protocol (if indicated): d/c today  VAP protocol (if indicated): d/c DVT prophylaxis: eliquis GI prophylaxis: famotidine, renal dosing Glucose control: per TRH Mobility: bed rest Code Status: full Family Communication: will discuss who will call family with TRH Disposition: remain in ICU  Labs   CBC: Recent Labs  Lab 05/24/19 0534 05/25/19 0500 05/26/19 0010 05/27/19 0400 05/28/19 0504  WBC 18.3* 12.1* 10.7* 11.9* 12.5*  HGB 14.4 12.5* 13.6 10.1* 10.5*  HCT 48.9 42.3 45.6 34.6* 35.1*  MCV 101.9* 101.7* 102.0* 102.4* 102.3*  PLT 120* 113* 125* 109* 103*    Basic Metabolic Panel: Recent Labs  Lab 05/24/19 0534  05/25/19 0500  05/26/19 0010 05/26/19 1530 05/27/19 0615 05/27/19 1605 05/28/19 0504  NA 145   < > 141   < > 135 137 139 135 136  K 3.7   < > 4.2   < > 4.1 4.4 5.4* 4.1 3.8  CL 107   < > 106   < > 103 105 104 105 102  CO2 27   < > 25   < > 24 27 26 23 27   GLUCOSE 130*   < > 82   < > 94 108* 158* 215* 150*  BUN 97*   < > 51*   < > 33* 22 21 19 20   CREATININE 2.18*   < > 1.63*   < > 1.62* 1.20 1.33* 1.22 1.26*  CALCIUM 8.3*   < > 8.2*   < > 8.2* 7.7* 8.1* 7.6* 8.1*  MG 2.1  --  2.2  --  2.2  --  2.1  --  2.1  PHOS  --    < > 2.6   < > 2.0* 2.9 3.3 2.2* 2.4*  2.4*   < > = values in this interval not displayed.   GFR: Estimated Creatinine Clearance: 62.3 mL/min (A) (by C-G formula based on SCr of 1.26 mg/dL (H)). Recent Labs  Lab 05/25/19 0500 05/26/19 0010 05/27/19 0400 05/28/19 0504  WBC 12.1* 10.7* 11.9* 12.5*    Liver Function Tests: Recent Labs  Lab 05/22/19 0400 05/23/19 0550 05/24/19 0534  05/26/19 1530 05/27/19 0400 05/27/19  0615 05/27/19 1605 05/28/19 0504  AST 74* 75* 60*  --   --  49*  --   --   --   ALT 104* 122* 121*  --   --  78*  --   --   --   ALKPHOS 190* 202* 235*  --   --  320*  --   --   --   BILITOT 1.3* 1.1 2.0*  --   --  1.3*  --   --   --   PROT 4.8* 4.9* 5.8*  --   --  4.7*  --   --   --   ALBUMIN 2.4* 2.2* 2.6*   < > 2.0* 2.1*  2.3* 2.2* 2.4*   < > = values in this interval not displayed.   No results for input(s): LIPASE, AMYLASE in the last 168 hours. No results for input(s): AMMONIA in the last 168 hours.  ABG    Component Value Date/Time   PHART 7.473 (H) 05/23/2019 0406   PCO2ART 34.9 05/23/2019 0406   PO2ART 106.0 05/23/2019 0406   HCO3 25.7 05/23/2019 0406   TCO2 27 05/23/2019 0406   ACIDBASEDEF 2.9 (H) 05/14/2019 0625   O2SAT 99.0 05/23/2019 0406     Coagulation Profile: No results for input(s): INR, PROTIME in the last 168 hours.  Cardiac Enzymes: No results for input(s): CKTOTAL, CKMB, CKMBINDEX, TROPONINI in the last 168 hours.  HbA1C: Hgb A1c MFr Bld  Date/Time Value Ref Range Status  03/12/2019 07:14 AM 6.7 (H) 4.8 - 5.6 % Final    Comment:    (NOTE) Pre diabetes:          5.7%-6.4% Diabetes:              >6.4% Glycemic control for   <7.0% adults with diabetes   09/08/2018 12:03 PM 6.3 (H) <5.7 % of total Hgb Final    Comment:    For someone without known diabetes, a hemoglobin  A1c value between 5.7% and 6.4% is consistent with prediabetes and should be confirmed with a  follow-up test. . For someone with known diabetes, a value <7% indicates that their diabetes is well controlled. A1c targets should be individualized based on duration of diabetes, age, comorbid conditions, and other considerations. . This assay result is consistent with an increased risk of diabetes. . Currently, no consensus exists regarding use of hemoglobin A1c for diagnosis of diabetes for children. .     CBG: Recent Labs  Lab 05/27/19 0752 05/27/19 1121 05/27/19  1732 05/27/19 2131 05/28/19 0801  GLUCAP 140* 176* 176* 190* 133*   On afternoon rounds, the patient is dropping his pressure again and is now in a-fib with RVR.  I do not believe it is safe to move him out of the ICU and do not believe he will tolerate iHD.  Will run by nephrology and keep in the ICU in [facility] for now.  The patient is critically ill with multiple organ systems failure and requires high complexity decision making for assessment and support, frequent evaluation and titration of therapies, application of advanced monitoring technologies and extensive interpretation of multiple databases.   Critical Care Time devoted to patient care services described in this note is  38  Minutes. This time reflects time of care of this signee Dr Jennet Maduro. This critical care time does not reflect procedure time, or teaching time or supervisory time of PA/NP/Med student/Med Resident etc but could involve care discussion time.  Rush Farmer, M.D. [facility] Pulmonary/Critical Care Medicine.

## 2019-05-28 NOTE — Progress Notes (Signed)
[facility] Progress Note   Called back to see pt on 05/20/19. He is a 68 year old WM with ICM- EF 40-45%, Afib, DM and also CKD (recent best crt baseline around 2). He was hosp at [facility] from 9/14-9/19- discharged, then readmitted 10/6 to [facility]- S/p cardiac arrest on 10/7. He suffered A on CRF in that setting with crt peaking at 5.17 on 10/10, then improved slowly - renal service signed off. Transferred to the ICU for hypoxia and intubated 10/21 and treated with remdesevir again -> just extubated 10/24.   Assessment/ Plan:    Renal-Baseline CKD- crt 2- This is second event of A on CRF this hosp. Creat / Bun stable, no indication RRT yet. Per CCM feel by CT patient may be intravasc wet, IV lasix started 10/22. Then BUN/ Cr rose significantly so CRRT started 10/24.   - CRRT dc'd around 10 am today  - plan is to transfer to [facility] for iHD  - labs reviewed   - 1L [place], 88- 92%  - +1.0 L yest, 400 cc UOP   - off neo for now   SP cardiac arrest - on 10/07  Atrial fib w/ RVR - on po metoprolol and cardizem for rate control  Hypotension - started on midodrine 10 tid  COVID PNA/ underlying COPD/ vol excess- responded well to UF w/ crrt and to lasix and was extubated   Hyperkalemia- resolved  Anemia-not a significant issue   CAD h/o CABG  DM2   Rob Rumaldo Difatta  MD 05/28/2019, 3:52 PM    Subjective:   No new issues, CRRT stopped this am, have d/w RN about all changes     Objective:   BP 123/73   Pulse (!) 101   Temp 98.7 F (37.1 C) (Oral)   Resp (!) 23   Ht 5\' 11"  (1.803 m)   Wt 85.2 kg   SpO2 (!) 89%   BMI 26.20 kg/m   Intake/Output Summary (Last 24 hours) at 05/28/2019 1552 Last data filed at 05/28/2019 1400 Gross per 24 hour  Intake 1388.48 ml  Output 500 ml  Net 888.48 ml   Weight change: -1.5 kg  Physical Exam:  Patient not examined directly given COVID-19 + status, utilizing exam of the primary team and observations of RN's.     Imaging: No results found.  Labs: BMET Recent Labs  Lab 05/25/19 0500 05/25/19 1540 05/26/19 0010 05/26/19 1530 05/27/19 0615 05/27/19 1605 05/28/19 0504  NA 141 135 135 137 139 135 136  K 4.2 4.3 4.1 4.4 5.4* 4.1 3.8  CL 106 103 103 105 104 105 102  CO2 25 25 24 27 26 23 27   GLUCOSE 82 177* 94 108* 158* 215* 150*  BUN 51* 39* 33* 22 21 19 20   CREATININE 1.63* 1.42* 1.62* 1.20 1.33* 1.22 1.26*  CALCIUM 8.2* 7.7* 8.2* 7.7* 8.1* 7.6* 8.1*  PHOS 2.6 2.3* 2.0* 2.9 3.3 2.2* 2.4*  2.4*   CBC Recent Labs  Lab 05/25/19 0500 05/26/19 0010 05/27/19 0400 05/28/19 0504  WBC 12.1* 10.7* 11.9* 12.5*  HGB 12.5* 13.6 10.1* 10.5*  HCT 42.3 45.6 34.6* 35.1*  MCV 101.7* 102.0* 102.4* 102.3*  PLT 113* 125* 109* 103*    Medications:    . amiodarone  200 mg Oral BID  . calcitRIOL  0.25 mcg Oral Daily  . Chlorhexidine Gluconate Cloth  6 each Topical Daily  . diltiazem  30 mg Oral Q6H  . hydrocortisone sodium succinate  50  mg Intravenous Q6H  . insulin aspart  0-5 Units Subcutaneous QHS  . insulin aspart  0-9 Units Subcutaneous TID WC  . insulin glargine  12 Units Subcutaneous QHS  . Ipratropium-Albuterol  1 puff Inhalation Q6H  . levETIRAcetam  500 mg Oral BID  . Melatonin  6 mg Oral QHS  . midodrine  10 mg Oral Q8H  . mometasone-formoterol  2 puff Inhalation BID  . pantoprazole  40 mg Oral Daily  . sodium zirconium cyclosilicate  10 g Oral BID

## 2019-05-28 NOTE — Progress Notes (Signed)
Spoke with pts wife via facetime today at 1000 and updated. Pt is not being transferred today as previously thought. I will contact patients wife and update.Dylan Goodman

## 2019-05-28 NOTE — Progress Notes (Signed)
SLP Cancellation Note  Patient Details Name: Dylan Goodman MRN: [id number] DOB: Feb 11, 1951   Cancelled treatment:  Pt at procedure or test/unavailable.  CRRT being discontinued.  Pt for possible transfer to [facility] this afternoon.        [name] 05/28/2019, 11:13 AM

## 2019-05-28 NOTE — Progress Notes (Signed)
Next of kin was updated by this nurse. No further questions were asked.

## 2019-05-28 NOTE — Progress Notes (Signed)
Updated pts wife via phone about staying at [place].    Assisted patient to side of bed, attempted 3 x with 2 staff members, gait belt and stedy to stand. Pt unable to stand, but did sit on side of bed for 45 minutes listening to his music. Etta Quill, RN

## 2019-05-28 NOTE — Progress Notes (Signed)
ANTICOAGULATION CONSULT NOTE - Follow Up Consult  Pharmacy Consult for Heparin Indication: atrial fibrillation  No Known Allergies  Patient Measurements: Height: 5\' 11"  (180.3 cm) Weight: 187 lb 13.3 oz (85.2 kg) IBW/kg (Calculated) : 75.3 Heparin Dosing Weight: 86.7 kg  Vital Signs: Temp: 97.5 F (36.4 C) (10/29 0400) Temp Source: Oral (10/29 0400) BP: 127/89 (10/29 0600) Pulse Rate: 114 (10/29 0600)  Labs: Recent Labs    05/26/19 0010 05/26/19 0650 05/26/19 1030  05/26/19 2230 05/27/19 0400 05/27/19 0615 05/27/19 1605 05/28/19 0504 05/28/19 0508  HGB 13.6  --   --   --   --  10.1*  --   --  10.5*  --   HCT 45.6  --   --   --   --  34.6*  --   --  35.1*  --   PLT 125*  --   --   --   --  109*  --   --  103*  --   APTT 56*  --  63*  --  69*  --  86*  --   --   --   HEPARINUNFRC 0.87*  --  0.63  --   --   --  0.68  --   --  0.76*  CREATININE 1.62*  --   --    < >  --   --  1.33* 1.22 1.26*  --   TROPONINIHS  --  2,883*  --   --   --   --  1,611*  --   --   --    < > = values in this interval not displayed.    Estimated Creatinine Clearance: 62.3 mL/min (A) (by C-G formula based on SCr of 1.26 mg/dL (H)).   Medications:  Infusions:  .  prismasol BGK 4/2.5 400 mL/hr at 05/28/19 0437  .  prismasol BGK 4/2.5 200 mL/hr at 05/28/19 0146  . sodium chloride 10 mL/hr at 05/28/19 0600  . heparin 1,700 Units/hr (05/28/19 0600)  . phenylephrine (NEO-SYNEPHRINE) Adult infusion Stopped (05/27/19 2347)  . prismasol BGK 4/2.5 1,500 mL/hr at 05/28/19 1601    Assessment: 68 yo male previously hospitalized for COVID pneumonia 9/14-9/19 then returned for seizure-like activity and subsequent cardiac arrest 10/7.  On chronic apixaban for afib. Starting CRRT 10/24 for AKI with worsening uremia.  Pharmacy consulted to convert apixaban to IV heparin.  Last dose of apixaban given 10/24 at 09:23 AM.    Today, HL 0.76. Plt low at 103, Hgb stable but low   Goal of Therapy:  Heparin  level 0.3-0.7 units/ml aPTT 66-102 seconds Monitor platelets by anticoagulation protocol: Yes   Plan:  Decrease heparin infusion at 1550 units/hr Obtain HL in 8 hours after rate change  Obtain daily aPTT and Heparin level  Monitor for signs/symptoms of bleeding   Royetta Asal, PharmD, BCPS 05/28/2019 6:57 AM

## 2019-05-28 NOTE — Progress Notes (Addendum)
PROGRESS NOTE  Dylan Goodman BPZ:[id number] DOB: 1951/03/11 DOA: 05-17-19  PCP: Steele Sizer, MD  Brief History/Interval Summary: 68 year old with a history of atrial fibrillation, GIB, CAD status post CABG, nasopharyngeal cancer, CKD, DM, and GERD who was previously treated for Covid pneumonia 9/17 and returned to the [facility] ED with reported seizure-like behavior, and subsequently suffered a cardiac arrest.  He was intubated and transferred to [facility] ICU.  Reason for Visit: Pneumonia due to COVID-19.  Acute respiratory failure with hypoxia.  Consultants: Pulmonology.  Nephrology.  Significant Events: 9/14 > 9/19 admitted to the [facility] for Covid pneumonia 10/5 ED evaluation with chest pain -discharged home 10/6 ED evaluation for seizure -CT head negative for acute findings 05/17/2023 coded while in ED awaiting placement -intubated 10/8 extubated -transferred out of ICU 10/15 hypoxia requiring PCCM consultation 10/19 high resolution CT -cardiomegaly, small pleural effusions, extensive patchy consolidation from last opacities -moderate to severe emphysema 10/21 intubation -vasc cath placement -transfer to [facility] 10/24 extubated  Procedures:   Antibiotics: Anti-infectives (From admission, onward)   Start     Dose/Rate Route Frequency Ordered Stop   05/20/19 2320  ceFEPIme (MAXIPIME) 2 g in sodium chloride 0.9 % 100 mL IVPB  Status:  Discontinued     2 g 200 mL/hr over 30 Minutes Intravenous Every 24 hours 05/20/19 0840 05/21/19 1227   05/20/19 1000  remdesivir 100 mg in sodium chloride 0.9 % 250 mL IVPB     100 mg 500 mL/hr over 30 Minutes Intravenous Every 24 hours 05/19/19 0837 05/23/19 1050   05/19/19 1000  ceFEPIme (MAXIPIME) 2 g in sodium chloride 0.9 % 100 mL IVPB  Status:  Discontinued     2 g 200 mL/hr over 30 Minutes Intravenous Every 12 hours 05/19/19 0837 05/20/19 0840   05/19/19 1000  remdesivir 200 mg in sodium chloride 0.9 % 250 mL IVPB     200 mg 500  mL/hr over 30 Minutes Intravenous Once 05/19/19 0837 05/19/19 1241   05/09/19 1000  piperacillin-tazobactam (ZOSYN) IVPB 2.25 g  Status:  Discontinued     2.25 g 100 mL/hr over 30 Minutes Intravenous Every 8 hours 05/09/19 0833 05/10/19 1346   05/08/19 1000  piperacillin-tazobactam (ZOSYN) IVPB 3.375 g  Status:  Discontinued     3.375 g 12.5 mL/hr over 240 Minutes Intravenous Every 12 hours 05/08/19 0930 05/09/19 0833   05/08/19 0630  piperacillin-tazobactam (ZOSYN) IVPB 3.375 g  Status:  Discontinued     3.375 g 12.5 mL/hr over 240 Minutes Intravenous Every 12 hours 05/07/19 2040 05/08/19 0930   05/07/19 2200  vancomycin (VANCOCIN) 1,750 mg in sodium chloride 0.9 % 500 mL IVPB  Status:  Discontinued     1,750 mg 250 mL/hr over 120 Minutes Intravenous Every 48 hours 05/17/2019 1912 05/07/19 1039   05/07/19 2200  vancomycin (VANCOCIN) 1,500 mg in sodium chloride 0.9 % 500 mL IVPB  Status:  Discontinued     1,500 mg 250 mL/hr over 120 Minutes Intravenous Every 48 hours 05/07/19 1039 05/07/19 1228   05/07/19 0200  piperacillin-tazobactam (ZOSYN) IVPB 3.375 g  Status:  Discontinued     3.375 g 12.5 mL/hr over 240 Minutes Intravenous Every 8 hours 2019-05-17 1853 05/07/19 2040   05/17/2019 1930  piperacillin-tazobactam (ZOSYN) IVPB 3.375 g     3.375 g 100 mL/hr over 30 Minutes Intravenous  Once 2019/05/17 1853 05-17-19 2156      Subjective/Interval History: Patient mildly distracted.  States that he is feeling well.  Denies any chest pain or shortness of breath.    Assessment/Plan:  Acute Hypoxic Resp. Failure/Pneumonia due to COVID-19   Lab Results  Component Value Date   [employer] (A) 05/04/2019   [place] (A) 04/12/2019   [place] Not Detected 04/02/2019   [place] NEGATIVE 03/12/2019    Patient noted to be stable from a respiratory standpoint for the most part.  He is requiring 1 to 2 L of oxygen by nasal cannula.  Patient has completed course of  Remdesivir.  He had also completed course of steroids but due to concern for adrenal insufficiency he is now on stress dose steroids.  Continue mobilization.  Incentive spirometry.  PT and OT recommends CIR.    Hypotension/possible adrenal insufficiency Reason for low blood pressure not entirely clear but possibility of adrenal insufficiency exists.  Patient placed on stress dose steroid.  CRRT could have also contributed.  Patient was requiring phenylephrine.  Next leg was weaned to off last night.  Remains on midodrine as well.  Random cortisol level was 6.3.    Acute renal failure on chronic kidney disease stage IV/uremia/hyperkalemia Patient has been on CRRT.  Nephrology has been following and managing.  Plan is to transition to intermittent hemodialysis.  Patient has been off of pressors since last night.  If he remains stable by this afternoon he could be transferred to [facility].  Chronic atrial fibrillation with acute RVR Patient is chronically on Eliquis.  Currently on heparin infusion.  Patient was on short acting Cardizem.  He was also placed on low-dose metoprolol on 10/27.  Heart rate still running between 100-120.  Not well controlled.  Blood pressure remains borderline low.  I think we may have to use amiodarone.  We will start him on oral amiodarone.  Due to renal failure not a good candidate for digoxin.  We will stop his metoprolol.  Diffuse ST depression noted on EKG This is most likely rate related as that EKG did show that he was tachycardic.  Patient was asymptomatic.  EKG was repeated yesterday and continues to show diffuse ST changes.  Patient is asymptomatic.  Will benefit from cardiology evaluation when he is at [facility].  He was seen by cardiology when he was at [facility] during the earlier part of this hospitalization.  Acute on chronic systolic CHF Echocardiogram from 10/9 showed a EF of 45 to 50%.  Volume being managed by CRRT.    Single episode of  seizure Likely related to cardiac arrest.  He is on Keppra.  No further occurrences.  Diabetes mellitus type 2 CBG appears to be reasonably well controlled.  HbA1c 6.7 in August.  Transaminitis Likely related to COVID-19.  Could also be due to shock liver.  Counts are stable.  Continue to monitor periodically.  Deconditioning PT and OT is following.  CIR is recommended.  Macrocytosis Vitamin O75 and folic acid levels were checked and not found to be deficient.  Pressure injury Pressure Injury 05/25/19 Sacrum Bilateral Stage I -  Intact skin with non-blanchable redness of a localized area usually over a bony prominence. (Active)  05/25/19 2000  Location: Sacrum  Location Orientation: Bilateral  Staging: Stage I -  Intact skin with non-blanchable redness of a localized area usually over a bony prominence.  Wound Description (Comments):   Present on Admission:    DVT Prophylaxis: Heparin infusion PUD Prophylaxis: On Protonix Code Status: Full code Family Communication: Discussed with family members yesterday. Disposition Plan: Due to  borderline low blood pressures as well as elevated heart rate will hold off on transferring the patient to [facility].   Medications:  Scheduled:  calcitRIOL  0.25 mcg Oral Daily   Chlorhexidine Gluconate Cloth  6 each Topical Daily   diltiazem  30 mg Oral Q6H   feeding supplement (ENSURE ENLIVE)  237 mL Oral TID BM   hydrocortisone sodium succinate  50 mg Intravenous Q6H   insulin aspart  0-5 Units Subcutaneous QHS   insulin aspart  0-9 Units Subcutaneous TID WC   insulin glargine  12 Units Subcutaneous QHS   Ipratropium-Albuterol  1 puff Inhalation Q6H   levETIRAcetam  500 mg Oral BID   Melatonin  6 mg Oral QHS   metoprolol tartrate  25 mg Oral BID   midodrine  10 mg Oral Q8H   mometasone-formoterol  2 puff Inhalation BID   pantoprazole  40 mg Oral Daily   polyethylene glycol  17 g Oral BID   senna  1 tablet Oral BID    sodium chloride flush  3 mL Intravenous Q12H   sodium zirconium cyclosilicate  10 g Oral BID   Continuous:   prismasol BGK 4/2.5 400 mL/hr at 05/28/19 0437    prismasol BGK 4/2.5 200 mL/hr at 05/28/19 0146   sodium chloride Stopped (05/28/19 1050)   heparin 1,550 Units/hr (05/28/19 1100)   phenylephrine (NEO-SYNEPHRINE) Adult infusion     prismasol BGK 4/2.5 1,500 mL/hr at 05/28/19 0943   JKD:TOIZTI chloride, acetaminophen, albuterol, heparin, hydrOXYzine, sodium chloride, sodium chloride flush, zolpidem   Objective:  Vital Signs  Vitals:   05/28/19 0800 05/28/19 0900 05/28/19 1000 05/28/19 1100  BP: 114/77 115/66 113/70 94/68  Pulse: (!) 103 (!) 117 (!) 121 (!) 121  Resp: 20 (!) 25 (!) 22 (!) 27  Temp: 98 F (36.7 C)     TempSrc: Oral     SpO2: (!) 86% (!) 83% (!) 88% 94%  Weight:      Height:        Intake/Output Summary (Last 24 hours) at 05/28/2019 1236 Last data filed at 05/28/2019 1100 Gross per 24 hour  Intake 1382.42 ml  Output 505 ml  Net 877.42 ml   Filed Weights   05/26/19 0500 05/27/19 0132 05/28/19 0500  Weight: 86.7 kg 86.7 kg 85.2 kg    General appearance: Awake alert.  In no distress.  Mildly distracted Resp: Normal effort at rest.  Coarse breath sounds bilaterally with crackles at the bases.  No wheezing or rhonchi. Cardio: S1-S2 is normal regular.  No S3-S4.  No rubs murmurs or bruit GI: Abdomen is soft.  Nontender nondistended.  Bowel sounds are present normal.  No masses organomegaly Extremities: Mild edema bilateral lower extremities Neurologic: No obvious focal neurological deficits.   Lab Results:  Data Reviewed: I have personally reviewed following labs and imaging studies  CBC: Recent Labs  Lab 05/24/19 0534 05/25/19 0500 05/26/19 0010 05/27/19 0400 05/28/19 0504  WBC 18.3* 12.1* 10.7* 11.9* 12.5*  HGB 14.4 12.5* 13.6 10.1* 10.5*  HCT 48.9 42.3 45.6 34.6* 35.1*  MCV 101.9* 101.7* 102.0* 102.4* 102.3*  PLT 120* 113*  125* 109* 103*    Basic Metabolic Panel: Recent Labs  Lab 05/24/19 0534  05/25/19 0500  05/26/19 0010 05/26/19 1530 05/27/19 0615 05/27/19 1605 05/28/19 0504  NA 145   < > 141   < > 135 137 139 135 136  K 3.7   < > 4.2   < > 4.1 4.4 5.4*  4.1 3.8  CL 107   < > 106   < > 103 105 104 105 102  CO2 27   < > 25   < > 24 27 26 23 27   GLUCOSE 130*   < > 82   < > 94 108* 158* 215* 150*  BUN 97*   < > 51*   < > 33* 22 21 19 20   CREATININE 2.18*   < > 1.63*   < > 1.62* 1.20 1.33* 1.22 1.26*  CALCIUM 8.3*   < > 8.2*   < > 8.2* 7.7* 8.1* 7.6* 8.1*  MG 2.1  --  2.2  --  2.2  --  2.1  --  2.1  PHOS  --    < > 2.6   < > 2.0* 2.9 3.3 2.2* 2.4*   2.4*   < > = values in this interval not displayed.    GFR: Estimated Creatinine Clearance: 62.3 mL/min (A) (by C-G formula based on SCr of 1.26 mg/dL (H)).  Liver Function Tests: Recent Labs  Lab 05/22/19 0400 05/23/19 0550 05/24/19 0534  05/26/19 1530 05/27/19 0400 05/27/19 0615 05/27/19 1605 05/28/19 0504  AST 74* 75* 60*  --   --  49*  --   --   --   ALT 104* 122* 121*  --   --  78*  --   --   --   ALKPHOS 190* 202* 235*  --   --  320*  --   --   --   BILITOT 1.3* 1.1 2.0*  --   --  1.3*  --   --   --   PROT 4.8* 4.9* 5.8*  --   --  4.7*  --   --   --   ALBUMIN 2.4* 2.2* 2.6*   < > 2.0* 2.1* 2.3* 2.2* 2.4*   < > = values in this interval not displayed.     CBG: Recent Labs  Lab 05/27/19 1121 05/27/19 1732 05/27/19 2131 05/28/19 0801 05/28/19 1217  GLUCAP 176* 176* 190* 133* 166*     Anemia Panel: Recent Labs    05/26/19 0010  VITAMINB12 1,075*  FOLATE 6.4  FERRITIN 874*  TIBC 197*  IRON 110  RETICCTPCT 2.3       Radiology Studies: No results found.     LOS: 22 days   Hearl Heikes [employer] on [email or web address]  05/28/2019, 12:36 PM

## 2019-05-28 NOTE — Progress Notes (Signed)
ANTICOAGULATION CONSULT NOTE - Follow Up Consult  Pharmacy Consult for Heparin Indication: atrial fibrillation  No Known Allergies  Patient Measurements: Height: 5\' 11"  (180.3 cm) Weight: 187 lb 13.3 oz (85.2 kg) IBW/kg (Calculated) : 75.3 Heparin Dosing Weight: 86.7 kg  Vital Signs: Temp: 98 F (36.7 C) (10/29 1600) Temp Source: Oral (10/29 1600) BP: 111/76 (10/29 1600) Pulse Rate: 101 (10/29 1600)  Labs: Recent Labs    05/26/19 0010 05/26/19 0650  05/26/19 2230 05/27/19 0400 05/27/19 0615 05/27/19 1605 05/28/19 0504 05/28/19 0508 05/28/19 1651  HGB 13.6  --   --   --  10.1*  --   --  10.5*  --   --   HCT 45.6  --   --   --  34.6*  --   --  35.1*  --   --   PLT 125*  --   --   --  109*  --   --  103*  --   --   APTT 56*  --    < > 69*  --  86*  --  123*  --   --   HEPARINUNFRC 0.87*  --    < >  --   --  0.68  --   --  0.76* 0.71*  CREATININE 1.62*  --    < >  --   --  1.33* 1.22 1.26*  --   --   TROPONINIHS  --  2,883*  --   --   --  1,611*  --   --   --   --    < > = values in this interval not displayed.    Estimated Creatinine Clearance: 62.3 mL/min (A) (by C-G formula based on SCr of 1.26 mg/dL (H)).   Medications:  Infusions:  . heparin 1,550 Units/hr (05/28/19 1700)    Assessment: 68 yo male previously hospitalized for COVID pneumonia 9/14-9/19 then returned for seizure-like activity and subsequent cardiac arrest 10/7.  On chronic apixaban for afib. Starting CRRT 10/24 for AKI with worsening uremia.  Pharmacy consulted to convert apixaban to IV heparin.  Last dose of apixaban given 10/24 at 09:23 AM.    Heparin level came back at 0.71 after the rate decrease. We will decrease is slightly again and recheck level in AM.   Goal of Therapy:  Heparin level 0.3-0.7 units/ml aPTT 66-102 seconds Monitor platelets by anticoagulation protocol: Yes   Plan:  Decrease heparin infusion at 1500 units/hr F/u with heparin level in AM Monitor for signs/symptoms of  bleeding  Onnie Boer, PharmD, BCIDP, AAHIVP, CPP Infectious Disease Pharmacist 05/28/2019 6:05 PM

## 2019-05-28 NOTE — Plan of Care (Signed)
Patient Summary: No adverse events throughout the night. SOB or acute distress noted. No c/o pain reported. Patient remained on CRRT throughout the night without event. All lines and tubes remain intact. Patient remains on supplemental O2 to keep sat >85. Patient neo gtt stopped 2347. BP stable. See MAR re: medications administered and titration of gtt. See flowsheet re: assessment. Plan of care dicussed with patient. Addressed all questions and concerns. Plan of care continued.  Problem: Education: Goal: Knowledge of General Education information will improve Description: Including pain rating scale, medication(s)/side effects and non-pharmacologic comfort measures Outcome: Progressing   Problem: Health Behavior/Discharge Planning: Goal: Ability to manage health-related needs will improve Outcome: Progressing   Problem: Clinical Measurements: Goal: Ability to maintain clinical measurements within normal limits will improve Outcome: Progressing Goal: Will remain free from infection Outcome: Progressing Goal: Diagnostic test results will improve Outcome: Progressing Goal: Respiratory complications will improve Outcome: Progressing Goal: Cardiovascular complication will be avoided Outcome: Progressing   Problem: Activity: Goal: Risk for activity intolerance will decrease Outcome: Progressing   Problem: Nutrition: Goal: Adequate nutrition will be maintained Outcome: Progressing   Problem: Coping: Goal: Level of anxiety will decrease Outcome: Progressing   Problem: Elimination: Goal: Will not experience complications related to bowel motility Outcome: Progressing Goal: Will not experience complications related to urinary retention Outcome: Progressing   Problem: Pain Managment: Goal: General experience of comfort will improve Outcome: Progressing   Problem: Safety: Goal: Ability to remain free from injury will improve Outcome: Progressing   Problem: Skin Integrity: Goal:  Risk for impaired skin integrity will decrease Outcome: Progressing

## 2019-05-28 NOTE — Plan of Care (Signed)

## 2019-05-28 NOTE — Progress Notes (Addendum)
Assisted patient to use the urinal. Rosy to dark red colored urine was noticed by this nurse  amounting to 120. Pharmacist and [facility] from [employer] ([place] was informed. Awaiting new orders at the moment. Call received from [facility], advised to keep an eye for now and call them if blood on the urine is worsening. Pharmacist was informed about this.

## 2019-05-28 NOTE — Progress Notes (Signed)
OT Cancellation Note  Patient Details Name: Dylan Goodman MRN: [id number] DOB: [birth year]   Cancelled Treatment:    Reason Eval/Treat Not Completed: Patient at procedure or test/ unavailable. CRRT being discontinued. Will return later this pm/PT to see pt as schedule allows. Goal is to mobilize OOB to chair.   Zacory Fiola,HILLARY 05/28/2019, 10:32 AM  Dylan Goodman, OT/L   Acute OT Clinical Specialist Acute Rehabilitation Services Pager [phone] Office [phone]

## 2019-05-29 DIAGNOSIS — R319 Hematuria, unspecified: Secondary | ICD-10-CM

## 2019-05-29 DIAGNOSIS — N390 Urinary tract infection, site not specified: Secondary | ICD-10-CM

## 2019-05-29 LAB — URINALYSIS, ROUTINE W REFLEX MICROSCOPIC
Bilirubin Urine: NEGATIVE
Glucose, UA: NEGATIVE mg/dL
Ketones, ur: NEGATIVE mg/dL
Nitrite: NEGATIVE
Protein, ur: NEGATIVE mg/dL
RBC / HPF: >50 RBC/hpf — ABNORMAL HIGH (ref 0–5)
Specific Gravity, Urine: 1.005 (ref 1.005–1.030)
pH: 5 (ref 5.0–8.0)

## 2019-05-29 LAB — BASIC METABOLIC PANEL
Anion gap: 9 (ref 5–15)
BUN: 38 mg/dL — ABNORMAL HIGH (ref 8–23)
CO2: 24 mmol/L (ref 22–32)
Calcium: 8.5 mg/dL — ABNORMAL LOW (ref 8.9–10.3)
Chloride: 105 mmol/L (ref 98–111)
Creatinine, Ser: 2.04 mg/dL — ABNORMAL HIGH (ref 0.61–1.24)
GFR calc Af Amer: 38 mL/min — ABNORMAL LOW (ref >60)
GFR calc non Af Amer: 33 mL/min — ABNORMAL LOW (ref >60)
Glucose, Bld: 175 mg/dL — ABNORMAL HIGH (ref 70–99)
Potassium: 3.8 mmol/L (ref 3.5–5.1)
Sodium: 138 mmol/L (ref 135–145)

## 2019-05-29 LAB — GLUCOSE, CAPILLARY
Glucose-Capillary: 164 mg/dL — ABNORMAL HIGH (ref 70–99)
Glucose-Capillary: 135 mg/dL — ABNORMAL HIGH (ref 70–99)
Glucose-Capillary: 142 mg/dL — ABNORMAL HIGH (ref 70–99)

## 2019-05-29 LAB — CBC
HCT: 32.8 % — ABNORMAL LOW (ref 39.0–52.0)
Hemoglobin: 10.2 g/dL — ABNORMAL LOW (ref 13.0–17.0)
MCH: 31.6 pg (ref 26.0–34.0)
MCHC: 31.1 g/dL (ref 30.0–36.0)
MCV: 101.5 fL — ABNORMAL HIGH (ref 80.0–100.0)
Platelets: 108 10*3/uL — ABNORMAL LOW (ref 150–400)
RBC: 3.23 MIL/uL — ABNORMAL LOW (ref 4.22–5.81)
RDW: 19.9 % — ABNORMAL HIGH (ref 11.5–15.5)
WBC: 10.7 10*3/uL — ABNORMAL HIGH (ref 4.0–10.5)
nRBC: 0 % (ref 0.0–0.2)

## 2019-05-29 LAB — HEPARIN LEVEL (UNFRACTIONATED): Heparin Unfractionated: 0.66 IU/mL (ref 0.30–0.70)

## 2019-05-29 MED ORDER — NEPRO/CARBSTEADY PO LIQD
237.0000 mL | Freq: Two times a day (BID) | ORAL | Status: DC
  Administered 2019-05-29 – 2019-05-31 (×3): 237 mL via ORAL
  Filled 2019-05-29 (×3): qty 237

## 2019-05-29 MED ORDER — SODIUM CHLORIDE 0.9 % IV SOLN
2.0000 g | INTRAVENOUS | Status: DC
  Administered 2019-05-29 – 2019-05-31 (×3): 2 g via INTRAVENOUS
  Filled 2019-05-29: qty 20
  Filled 2019-05-29 (×2): qty 2

## 2019-05-29 NOTE — Progress Notes (Signed)
[facility] Progress Note   Called back to see pt on 05/20/19. He is a 68 year old WM with ICM- EF 40-45%, Afib, DM and also CKD (recent best crt baseline around 2). He was hosp at [facility] from 9/14-9/19- discharged, then readmitted 10/6 to [facility]- S/p cardiac arrest on 10/7. He suffered A on CRF in that setting with crt peaking at 5.17 on 10/10, then improved slowly - renal service signed off. Transferred to the ICU for hypoxia and intubated 10/21 and treated with remdesevir again -> just extubated 10/24.   Assessment/ Plan:    Renal-Baseline CKD- crt 2- This is second event of A on CRF this hosp. Creat / Bun stable, no indication RRT yet. Per CCM feel by CT patient may be intravasc wet, IV lasix started 10/22. Then BUN/ Cr rose significantly so CRRT started 10/24.   - CRRT dc'd yest am 10/29  - plan is to transfer to [facility] today  - labs reviewed, creat up 2.2   - 1L [place], 88- 92%  - + 700 cc yest, 1100 cc UOP, wt's are stable   - off pressors  - possibly he is recovering function, will hold off on HD for 1-2 days   SP cardiac arrest - on 10/07  Atrial fib w/ RVR - on po metoprolol and cardizem for rate control  Hypotension - started on midodrine 10 tid  COVID PNA/ underlying COPD/ vol excess- responded well to UF w/ crrt and to lasix and was extubated   Hyperkalemia- resolved  Anemia-not a significant issue   CAD h/o CABG  DM2   Rob Maudene Stotler  MD 05/29/2019, 3:58 PM    Subjective:    Patient not examined directly given COVID-19 + status, utilizing exam of the primary team and observations of RN's.       Objective:   BP 114/86   Pulse 90   Temp 98.2 F (36.8 C) (Oral)   Resp 19   Ht 5\' 11"  (1.803 m)   Wt 85.3 kg   SpO2 94%   BMI 26.23 kg/m   Intake/Output Summary (Last 24 hours) at 05/29/2019 1558 Last data filed at 05/29/2019 1500 Gross per 24 hour  Intake 1393.49 ml  Output 1310 ml  Net 83.49 ml   Weight change: 0.1  kg  Physical Exam:  Patient not examined directly given COVID-19 + status, utilizing exam of the primary team and observations of RN's.    Imaging: No results found.  Labs: BMET Recent Labs  Lab 05/25/19 0500 05/25/19 1540 05/26/19 0010 05/26/19 1530 05/27/19 0615 05/27/19 1605 05/28/19 0504 05/29/19 0605  NA 141 135 135 137 139 135 136 138  K 4.2 4.3 4.1 4.4 5.4* 4.1 3.8 3.8  CL 106 103 103 105 104 105 102 105  CO2 25 25 24 27 26 23 27 24   GLUCOSE 82 177* 94 108* 158* 215* 150* 175*  BUN 51* 39* 33* 22 21 19 20  38*  CREATININE 1.63* 1.42* 1.62* 1.20 1.33* 1.22 1.26* 2.04*  CALCIUM 8.2* 7.7* 8.2* 7.7* 8.1* 7.6* 8.1* 8.5*  PHOS 2.6 2.3* 2.0* 2.9 3.3 2.2* 2.4*  2.4*  --    CBC Recent Labs  Lab 05/26/19 0010 05/27/19 0400 05/28/19 0504 05/29/19 0605  WBC 10.7* 11.9* 12.5* 10.7*  HGB 13.6 10.1* 10.5* 10.2*  HCT 45.6 34.6* 35.1* 32.8*  MCV 102.0* 102.4* 102.3* 101.5*  PLT 125* 109* 103* 108*    Medications:    . amiodarone  200 mg  Oral BID  . calcitRIOL  0.25 mcg Oral Daily  . Chlorhexidine Gluconate Cloth  6 each Topical Daily  . diltiazem  30 mg Oral Q6H  . feeding supplement (NEPRO CARB STEADY)  237 mL Oral BID BM  . hydrocortisone sodium succinate  50 mg Intravenous Q6H  . insulin aspart  0-5 Units Subcutaneous QHS  . insulin aspart  0-9 Units Subcutaneous TID WC  . insulin glargine  12 Units Subcutaneous QHS  . Ipratropium-Albuterol  1 puff Inhalation Q6H  . levETIRAcetam  500 mg Oral BID  . Melatonin  6 mg Oral QHS  . midodrine  10 mg Oral Q8H  . mometasone-formoterol  2 puff Inhalation BID  . pantoprazole  40 mg Oral Daily

## 2019-05-29 NOTE — Progress Notes (Addendum)
(  0558)CCMD made aware of patient's urine still dark red in color and vascath site oozing. Also made them aware of patient having pain when urinating. No clots seen on the urine. CCMD called back at [phone] advising to defer issues on morning ward rounds.

## 2019-05-29 NOTE — Progress Notes (Signed)
  Speech Language Pathology Treatment: Dysphagia  Patient Details Name: Dylan Goodman MRN: [id number] DOB: 05/25/1951 Today's Date: 05/29/2019 Time: 1550-1600 SLP Time Calculation (min) (ACUTE ONLY): 10 min  Assessment / Plan / Recommendation Clinical Impression  F/u for swallowing - pt is doing very well with the mechanics of swallowing, though he has minimal appetite.  There are no concerns for aspiration, even when taxed with successive boluses of thin liquid.  RR is well within the parameters for adequate respiratory/swallowing synchrony.  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  Dysphagia has resolved.  No SLP f/u needed - our service will sign off.    HPI HPI: 68 year old with a history of atrial fibrillation, GIB, CAD status post CABG, nasopharyngeal cancer, CKD, DM, and GERD who was previously treated for Covid pneumonia and admitted 9/14-9/17. Returned to the [facility] ED with reported seizure-like behavior 10/6, and subsequently suffered a cardiac arrest.  He was intubated 10/7-8, transferred to [facility] ICU. 10/15 with hypoxia, respiratory failure and renal failure again requiring reintubation and transfer to [facility] 10/21, extubated 10/24. On CVVHD.       SLP Plan  All goals met       Recommendations  Diet recommendations: Regular;Thin liquid Liquids provided via: Straw;Cup Medication Administration: Whole meds with liquid Supervision: Patient able to self feed                Oral Care Recommendations: Oral care BID Follow up Recommendations: None SLP Visit Diagnosis: Dysphagia, unspecified (R13.10) Plan: All goals met       GO                Dylan Goodman 05/29/2019, 4:13 PM Dylan Goodman L. Dylan Goodman, Dylan Goodman Office number [phone]

## 2019-05-29 NOTE — Progress Notes (Signed)
Pt had frank hematuria.  Turned Heparin gtt off until further discussion with MD.

## 2019-05-29 NOTE — Progress Notes (Signed)
ANTICOAGULATION CONSULT NOTE - Follow Up Consult  Pharmacy Consult for Heparin Indication: atrial fibrillation  No Known Allergies  Patient Measurements: Height: 5\' 11"  (180.3 cm) Weight: 188 lb 0.8 oz (85.3 kg) IBW/kg (Calculated) : 75.3 Heparin Dosing Weight: 86.7 kg  Vital Signs: Temp: 98 F (36.7 C) (10/30 0800) Temp Source: Oral (10/30 0800) BP: 127/76 (10/30 0800) Pulse Rate: 91 (10/30 0800)  Labs: Recent Labs    05/26/19 2230  05/27/19 0400 05/27/19 0615 05/27/19 1605 05/28/19 0504 05/28/19 0508 05/28/19 1651 05/29/19 0605  HGB  --    < > 10.1*  --   --  10.5*  --   --  10.2*  HCT  --   --  34.6*  --   --  35.1*  --   --  32.8*  PLT  --   --  109*  --   --  103*  --   --  108*  APTT 69*  --   --  86*  --  123*  --   --   --   HEPARINUNFRC  --   --   --  0.68  --   --  0.76* 0.71* 0.66  CREATININE  --   --   --  1.33* 1.22 1.26*  --   --   --   TROPONINIHS  --   --   --  1,611*  --   --   --   --   --    < > = values in this interval not displayed.    Estimated Creatinine Clearance: 62.3 mL/min (A) (by C-G formula based on SCr of 1.26 mg/dL (H)).   Medications:  Infusions:  . heparin 1,500 Units/hr (05/29/19 0800)    Assessment: 68 yo male previously hospitalized for COVID pneumonia 9/14-9/19 then returned for seizure-like activity and subsequent cardiac arrest 10/7.  On chronic apixaban for afib. Starting CRRT 10/24 for AKI with worsening uremia.  Pharmacy consulted to convert apixaban to IV heparin.  Last dose of apixaban given 10/24 at 09:23 AM.    Today, HL 0.66. Plt low at 108, Hgb stable but low. Of note, night-shift RN reported pink blood tinged urine output. Now with frank blood in urine. MD agrees with plan to hold heparin for now   Goal of Therapy:  Heparin level 0.3-0.7 units/ml aPTT 66-102 seconds Monitor platelets by anticoagulation protocol: Yes   Plan:  Hold heparin drip. F/u with MD on when to restart  Monitor for resolution of  hematuria    Albertina Parr, PharmD., BCPS Clinical Pharmacist Clinical phone for 05/29/19 until 3:30pm: [phone]

## 2019-05-29 NOTE — Plan of Care (Signed)
Pt no longer on O2.  Transfer orders to [facility] pending.  Stopped Hepain gtt d/t frank hematuria.  VSS, NAD, No new complaints.    Problem: Education: Goal: Knowledge of General Education information will improve Description: Including pain rating scale, medication(s)/side effects and non-pharmacologic comfort measures Outcome: Progressing   Problem: Health Behavior/Discharge Planning: Goal: Ability to manage health-related needs will improve Outcome: Progressing   Problem: Clinical Measurements: Goal: Ability to maintain clinical measurements within normal limits will improve Outcome: Progressing Goal: Will remain free from infection Outcome: Progressing Goal: Diagnostic test results will improve Outcome: Progressing Goal: Respiratory complications will improve Outcome: Progressing Goal: Cardiovascular complication will be avoided Outcome: Progressing   Problem: Activity: Goal: Risk for activity intolerance will decrease Outcome: Progressing   Problem: Nutrition: Goal: Adequate nutrition will be maintained Outcome: Progressing   Problem: Coping: Goal: Level of anxiety will decrease Outcome: Progressing   Problem: Elimination: Goal: Will not experience complications related to bowel motility Outcome: Progressing Goal: Will not experience complications related to urinary retention Outcome: Progressing   Problem: Pain Managment: Goal: General experience of comfort will improve Outcome: Progressing   Problem: Safety: Goal: Ability to remain free from injury will improve Outcome: Progressing   Problem: Skin Integrity: Goal: Risk for impaired skin integrity will decrease Outcome: Progressing

## 2019-05-29 NOTE — Progress Notes (Signed)
Occupational Therapy Treatment Patient Details Name: Shaheen Mende MRN: [id number] DOB: Aug 30, 1950 Today's Date: 05/29/2019    History of present illness 68 y/o male treated for COVID pneumonia on September 13 returned to the ED on 10/7 with seizure and cardiac arrest.  Transitioned to floor where he had AKI which resolved but then later in his admission developed worsening respiratory failure and renal failure again.  Ultimately re-intubated on 10/21 and sent back to [facility]. Extubated 10/24. On CRRT   OT comments  Pt progressing well toward stated goals, focused session on BADL mobility this session. He is able to complete bed mobility at min A this date. Pt sat EOB ~8 minutes while therapist assisted in cutting hair per pt request. He is on 2L [place] throughout session with VSS. After sitting EOB pt able to stand with min A +2 for ~ 3 minutes before fatiguing with VSS and cues for pursed lip breathing. He continues to remain an excellent CIR candidate. Will continue to follow and progress per POC.   Follow Up Recommendations  CIR    Equipment Recommendations  None recommended by OT    Recommendations for Other Services      Precautions / Restrictions Precautions Precautions: Fall Restrictions Weight Bearing Restrictions: No       Mobility Bed Mobility Overal bed mobility: Needs Assistance Bed Mobility: Sidelying to Sit   Sidelying to sit: Min assist   Sit to supine: Min assist   General bed mobility comments: min A for bed mobility this date for safety. Increased time and use of rails with VC's from therapist for sequencing of t/f  Transfers Overall transfer level: Needs assistance Equipment used: 2 person hand held assist Transfers: Sit to/from Stand Sit to Stand: Min assist;+2 safety/equipment         General transfer comment: for safety, pt able to stand ~ 3 minutes to take side steps up to [place] Overall balance assessment: Needs  assistance Sitting-balance support: Feet supported;Bilateral upper extremity supported Sitting balance-Leahy Scale: Fair Sitting balance - Comments: sitting EOB For duration of grooming tasks   Standing balance support: Bilateral upper extremity supported Standing balance-Leahy Scale: Poor Standing balance comment: reliant on external support                           ADL either performed or assessed with clinical judgement   ADL Overall ADL's : Needs assistance/impaired Eating/Feeding: Set up;Sitting   Grooming: Set up;Sitting                   Toilet Transfer: Moderate assistance;+2 for physical assistance;+2 for safety/equipment           Functional mobility during ADLs: Moderate assistance;+2 for physical assistance;+2 for safety/equipment(sit <> stand only) General ADL Comments: continuing to present with weakness and decreased tolerance for BADL mobility     Vision Baseline Vision/History: No visual deficits Patient Visual Report: No change from baseline     Perception     Praxis      Cognition Arousal/Alertness: Awake/alert Behavior During Therapy: WFL for tasks assessed/performed Overall Cognitive Status: Within Functional Limits for tasks assessed                                          Exercises     Shoulder Instructions  General Comments      Pertinent Vitals/ Pain       Pain Assessment: No/denies pain  Home Living                                          Prior Functioning/Environment              Frequency  Min 2X/week        Progress Toward Goals  OT Goals(current goals can now be found in the care plan section)  Progress towards OT goals: Progressing toward goals  Acute Rehab OT Goals Patient Stated Goal: to get back home OT Goal Formulation: With patient Time For Goal Achievement: 06/08/19 Potential to Achieve Goals: Good  Plan Discharge plan remains appropriate     Co-evaluation                 AM-PAC OT "6 Clicks" Daily Activity     Outcome Measure   Help from another person eating meals?: A Little Help from another person taking care of personal grooming?: A Little Help from another person toileting, which includes using toliet, bedpan, or urinal?: A Lot Help from another person bathing (including washing, rinsing, drying)?: A Little Help from another person to put on and taking off regular upper body clothing?: A Little Help from another person to put on and taking off regular lower body clothing?: A Lot 6 Click Score: 16    End of Session Equipment Utilized During Treatment: Oxygen  OT Visit Diagnosis: Unsteadiness on feet (R26.81);Muscle weakness (generalized) (M62.81);Other (comment)(cardiopulmonary status)   Activity Tolerance Patient tolerated treatment well   Patient Left in bed;with call bell/phone within reach;with bed alarm set;with nursing/sitter in room   Nurse Communication Mobility status        Time: 4481-8563 OT Time Calculation (min): 26 min  Charges: OT General Charges $OT Visit: 1 Visit OT Treatments $Self Care/Home Management : 23-37 mins  Zenovia Jarred, MSOT, OTR/L [place] OT/ Acute Relief OT GVC: [place] 05/29/2019, 5:50 PM

## 2019-05-29 NOTE — Progress Notes (Addendum)
PROGRESS NOTE  Dylan Goodman TZG:[id number] DOB: July 15, 1951 DOA: May 12, 2019  PCP: Steele Sizer, MD  Brief History/Interval Summary: 68 year old with a history of atrial fibrillation, GIB, CAD status post CABG, nasopharyngeal cancer, CKD, DM, and GERD who was previously treated for Covid pneumonia 9/17 and returned to the [facility] ED with reported seizure-like behavior, and subsequently suffered a cardiac arrest.  He was intubated and transferred to [facility] ICU.  Reason for Visit: Pneumonia due to COVID-19.  Acute respiratory failure with hypoxia.  Consultants: Pulmonology.  Nephrology.  Urology  Significant Events: 9/14 > 9/19 admitted to the [facility] for Covid pneumonia 10/5 ED evaluation with chest pain -discharged home 10/6 ED evaluation for seizure -CT head negative for acute findings 05-12-2023 coded while in ED awaiting placement -intubated 10/8 extubated -transferred out of ICU 10/15 hypoxia requiring PCCM consultation 10/19 high resolution CT -cardiomegaly, small pleural effusions, extensive patchy consolidation from last opacities -moderate to severe emphysema 10/21 intubation -vasc cath placement -transfer to [facility] 10/24 extubated  Procedures:   Transthoracic echocardiogram 10/9 1. Left ventricular ejection fraction, by visual estimation, is 45 to 50%. The left ventricle has mildly decreased function. Normal left ventricular size. There is mildly increased left ventricular hypertrophy.  2. Left ventricular diastolic Doppler parameters are indeterminate pattern of LV diastolic filling.  3. Difficult to visualize LV contours.  4. Global right ventricle has normal systolic function.The right ventricular size is normal. No increase in right ventricular wall thickness.  5. Left atrial size was severely dilated.  6. Right atrial size was moderately dilated.  7. The mitral valve is normal in structure. Mild mitral valve regurgitation. No evidence of mitral stenosis.  8. The  tricuspid valve is normal in structure. Tricuspid valve regurgitation is mild.  9. The aortic valve is tricuspid Aortic valve regurgitation was not visualized by color flow Doppler. Mild aortic valve sclerosis without stenosis. 10. The pulmonic valve was normal in structure. Pulmonic valve regurgitation is not visualized by color flow Doppler. 11. Moderately elevated pulmonary artery systolic pressure. 12. The inferior vena cava is normal in size with greater than 50% respiratory variability, suggesting right atrial pressure of 3 mmHg.  Antibiotics: Anti-infectives (From admission, onward)   Start     Dose/Rate Route Frequency Ordered Stop   05/29/19 1400  cefTRIAXone (ROCEPHIN) 2 g in sodium chloride 0.9 % 100 mL IVPB     2 g 200 mL/hr over 30 Minutes Intravenous Every 24 hours 05/29/19 1255     05/20/19 2320  ceFEPIme (MAXIPIME) 2 g in sodium chloride 0.9 % 100 mL IVPB  Status:  Discontinued     2 g 200 mL/hr over 30 Minutes Intravenous Every 24 hours 05/20/19 0840 05/21/19 1227   05/20/19 1000  remdesivir 100 mg in sodium chloride 0.9 % 250 mL IVPB     100 mg 500 mL/hr over 30 Minutes Intravenous Every 24 hours 05/19/19 0837 05/23/19 1050   05/19/19 1000  ceFEPIme (MAXIPIME) 2 g in sodium chloride 0.9 % 100 mL IVPB  Status:  Discontinued     2 g 200 mL/hr over 30 Minutes Intravenous Every 12 hours 05/19/19 0837 05/20/19 0840   05/19/19 1000  remdesivir 200 mg in sodium chloride 0.9 % 250 mL IVPB     200 mg 500 mL/hr over 30 Minutes Intravenous Once 05/19/19 0837 05/19/19 1241   05/09/19 1000  piperacillin-tazobactam (ZOSYN) IVPB 2.25 g  Status:  Discontinued     2.25 g 100 mL/hr over 30 Minutes Intravenous  Every 8 hours 05/09/19 0833 05/10/19 1346   05/08/19 1000  piperacillin-tazobactam (ZOSYN) IVPB 3.375 g  Status:  Discontinued     3.375 g 12.5 mL/hr over 240 Minutes Intravenous Every 12 hours 05/08/19 0930 05/09/19 0833   05/08/19 0630  piperacillin-tazobactam (ZOSYN) IVPB 3.375  g  Status:  Discontinued     3.375 g 12.5 mL/hr over 240 Minutes Intravenous Every 12 hours 05/07/19 2040 05/08/19 0930   05/07/19 2200  vancomycin (VANCOCIN) 1,750 mg in sodium chloride 0.9 % 500 mL IVPB  Status:  Discontinued     1,750 mg 250 mL/hr over 120 Minutes Intravenous Every 48 hours 05/06/19 1912 05/07/19 1039   05/07/19 2200  vancomycin (VANCOCIN) 1,500 mg in sodium chloride 0.9 % 500 mL IVPB  Status:  Discontinued     1,500 mg 250 mL/hr over 120 Minutes Intravenous Every 48 hours 05/07/19 1039 05/07/19 1228   05/07/19 0200  piperacillin-tazobactam (ZOSYN) IVPB 3.375 g  Status:  Discontinued     3.375 g 12.5 mL/hr over 240 Minutes Intravenous Every 8 hours 05/06/19 1853 05/07/19 2040   05/06/19 1930  piperacillin-tazobactam (ZOSYN) IVPB 3.375 g     3.375 g 100 mL/hr over 30 Minutes Intravenous  Once 05/06/19 1853 05/06/19 2156      Subjective/Interval History: Patient denies any complaints this morning.  Not very communicative.  Denies any shortness of breath or chest pain.  No nausea vomiting.    Assessment/Plan:  Acute Hypoxic Resp. Failure/Pneumonia due to COVID-19   Lab Results  Component Value Date   [place] (A) 05/04/2019   [place] (A) 04/12/2019   [place] Not Detected 04/02/2019   [place] NEGATIVE 03/12/2019   Patient remained stable from a respiratory standpoint.  At most he is requiring 1 L of oxygen by nasal cannula.  It appears that some of the time he has been on room air.  Patient has completed course of remdesivir and steroids.  However he is on stress dose steroids due to concern for adrenal insufficiency.  See below.  Continue mobilization.  Incentive spirometry.  PT recommends CIR.  He will need repeat testing when closer to medical stability.  Hypotension/possible adrenal insufficiency Reason for low blood pressure not entirely clear but possibility of adrenal insufficiency exists.  Stress dose steroids.  CRRT could  have also contributed.  Patient was requiring phenylephrine but not in the last 36 hours.  Remains on midodrine.  Blood pressure has improved.  Random cortisol level was 6.3.  Slowly wean down stress dose steroids.    Acute renal failure on chronic kidney disease stage IV/uremia/hyperkalemia Patient had been on CRRT.  Nephrology has been following and managing.  Plan is to transition to intermittent hemodialysis.  Will transfer him to [facility] today for this purpose.  However it appears that his urine output has picked up some.  Nephrology to decide further course of action.  Hematuria/acute cystitis UA shows large hemoglobin, many bacteria, more than 50 RBCs and 6-10 WBC.  Heparin infusion placed on hold.  Discussed with Dr. Alyson Ingles with urology who recommends initiating antibiotics.  We will start him on ceftriaxone.  Urine culture also ordered.  He does not recommend reinsertion of Foley catheter.  Patient did have a Foley few days ago.  Obviously if the patient is unable to urinate then one may have to be placed urgently.  Anticipate his hematuria will improve with these measures.  If it does not then Dr. Alyson Ingles will be happy to see him  in person.  Chronic atrial fibrillation with acute RVR Patient is chronically on Eliquis.  Currently on heparin infusion.  Patient was on short acting Cardizem.  He was also placed on low-dose metoprolol on 10/27.  Heart rate remained poorly controlled.  Yesterday patient was given a small fluid bolus and was started on amiodarone.  Heart rate appears to have improved.  Metoprolol was discontinued.  Continue Cardizem.  He will need to be seen by cardiology.  Could defer to the outpatient setting.  Diffuse ST depression noted on EKG This is most likely rate related as that EKG did show that he was tachycardic.  Patient was asymptomatic.  EKG was repeated and continues to show diffuse ST changes.  Patient is asymptomatic.  Transthoracic echocardiogram done on 10/9  showed diminished ejection fraction.  See above.  Patient will benefit from cardiology evaluation.  This could be pursued in the outpatient setting.  He was seen by [facility] heart care during the earlier part of this hospitalization.  The last note is from Dr. Sallyanne Kuster on 10/15.    Acute on chronic systolic CHF Echocardiogram from 10/9 showed a EF of 45 to 50%.  Volume is being managed by CRRT.  Single episode of seizure Likely related to cardiac arrest.  He is on Keppra.  No further occurrences.  Diabetes mellitus type 2 CBG appears to be reasonably well controlled.  HbA1c 6.7 in August.  Transaminitis Likely related to COVID-19.  Could also be due to shock liver.  Counts are stable.  Continue to monitor periodically.  Deconditioning PT and OT is following.  CIR is recommended.  Macrocytosis Vitamin W54 and folic acid levels were checked and not found to be deficient.  History of carotid artery disease and questionable peripheral artery disease Home medication list show that patient was on Pletal as well as Brilinta.  He was also on Eliquis.  He is on anticoagulants as well as antiplatelet agents increasing risk of bleed.  Consider reinitiation of antiplatelet agents depending on his clinical course over the next few days.  Pressure injury Pressure Injury 05/25/19 Sacrum Bilateral Stage I -  Intact skin with non-blanchable redness of a localized area usually over a bony prominence. (Active)  05/25/19 2000  Location: Sacrum  Location Orientation: Bilateral  Staging: Stage I -  Intact skin with non-blanchable redness of a localized area usually over a bony prominence.  Wound Description (Comments):   Present on Admission:    DVT Prophylaxis: Heparin infusion PUD Prophylaxis: On Protonix Code Status: Full code Family Communication: Voicemail left on patient's wife's phone yesterday.  We will try to call her again today. Disposition Plan: Hopefully transfer to [facility] today.    Medications:  Scheduled: . amiodarone  200 mg Oral BID  . calcitRIOL  0.25 mcg Oral Daily  . Chlorhexidine Gluconate Cloth  6 each Topical Daily  . diltiazem  30 mg Oral Q6H  . feeding supplement (NEPRO CARB STEADY)  237 mL Oral BID BM  . hydrocortisone sodium succinate  50 mg Intravenous Q6H  . insulin aspart  0-5 Units Subcutaneous QHS  . insulin aspart  0-9 Units Subcutaneous TID WC  . insulin glargine  12 Units Subcutaneous QHS  . Ipratropium-Albuterol  1 puff Inhalation Q6H  . levETIRAcetam  500 mg Oral BID  . Melatonin  6 mg Oral QHS  . midodrine  10 mg Oral Q8H  . mometasone-formoterol  2 puff Inhalation BID  . pantoprazole  40 mg Oral Daily  Continuous: . cefTRIAXone (ROCEPHIN)  IV     ALP:[id number], albuterol, hydrOXYzine, polyethylene glycol, senna, zolpidem   Objective:  Vital Signs  Vitals:   05/29/19 0900 05/29/19 1000 05/29/19 1100 05/29/19 1200  BP: (!) 128/112 98/73 111/71 100/65  Pulse: 97 86 88 84  Resp: 16 (!) 22 19 (!) 21  Temp:    98.2 F (36.8 C)  TempSrc:    Oral  SpO2: 91% 95% 97% (!) 89%  Weight:      Height:        Intake/Output Summary (Last 24 hours) at 05/29/2019 1348 Last data filed at 05/29/2019 1000 Gross per 24 hour  Intake 1536.45 ml  Output 1060 ml  Net 476.45 ml   Filed Weights   05/27/19 0132 05/28/19 0500 05/29/19 0451  Weight: 86.7 kg 85.2 kg 85.3 kg     General appearance: Awake alert.  In no distress.  Mildly distracted Resp: Normal effort at rest.  Coarse breath sound bilaterally with crackles at the bases.  No wheezing or rhonchi Cardio: S1-S2 is irregularly irregular.  No S3 or S4. GI: Abdomen is soft.  Nontender nondistended.  Bowel sounds are present normal.  No masses organomegaly Extremities: No edema.  Full range of motion of lower extremities. Neurologic:  No focal neurological deficits.     Lab Results:  Data Reviewed: I have personally reviewed following labs and imaging studies  CBC: Recent  Labs  Lab 05/25/19 0500 05/26/19 0010 05/27/19 0400 05/28/19 0504 05/29/19 0605  WBC 12.1* 10.7* 11.9* 12.5* 10.7*  HGB 12.5* 13.6 10.1* 10.5* 10.2*  HCT 42.3 45.6 34.6* 35.1* 32.8*  MCV 101.7* 102.0* 102.4* 102.3* 101.5*  PLT 113* 125* 109* 103* 108*    Basic Metabolic Panel: Recent Labs  Lab 05/24/19 0534  05/25/19 0500  05/26/19 0010 05/26/19 1530 05/27/19 0615 05/27/19 1605 05/28/19 0504 05/29/19 0605  NA 145   < > 141   < > 135 137 139 135 136 138  K 3.7   < > 4.2   < > 4.1 4.4 5.4* 4.1 3.8 3.8  CL 107   < > 106   < > 103 105 104 105 102 105  CO2 27   < > 25   < > 24 27 26 23 27 24   GLUCOSE 130*   < > 82   < > 94 108* 158* 215* 150* 175*  BUN 97*   < > 51*   < > 33* 22 21 19 20  38*  CREATININE 2.18*   < > 1.63*   < > 1.62* 1.20 1.33* 1.22 1.26* 2.04*  CALCIUM 8.3*   < > 8.2*   < > 8.2* 7.7* 8.1* 7.6* 8.1* 8.5*  MG 2.1  --  2.2  --  2.2  --  2.1  --  2.1  --   PHOS  --    < > 2.6   < > 2.0* 2.9 3.3 2.2* 2.4*  2.4*  --    < > = values in this interval not displayed.    GFR: Estimated Creatinine Clearance: 38.4 mL/min (A) (by C-G formula based on SCr of 2.04 mg/dL (H)).  Liver Function Tests: Recent Labs  Lab 05/23/19 0550 05/24/19 0534  05/26/19 1530 05/27/19 0400 05/27/19 0615 05/27/19 1605 05/28/19 0504  AST 75* 60*  --   --  49*  --   --   --   ALT 122* 121*  --   --  78*  --   --   --  ALKPHOS 202* 235*  --   --  320*  --   --   --   BILITOT 1.1 2.0*  --   --  1.3*  --   --   --   PROT 4.9* 5.8*  --   --  4.7*  --   --   --   ALBUMIN 2.2* 2.6*   < > 2.0* 2.1* 2.3* 2.2* 2.4*   < > = values in this interval not displayed.     CBG: Recent Labs  Lab 05/28/19 1217 05/28/19 1543 05/28/19 2138 05/29/19 0746 05/29/19 1110  GLUCAP 166* 137* 187* 164* 135*     Anemia Panel: No results for input(s): VITAMINB12, FOLATE, FERRITIN, TIBC, IRON, RETICCTPCT in the last 72 hours.     Radiology Studies: No results found.     LOS: 23 days    Chelsie Burel [employer] on [email or web address]  05/29/2019, 1:48 PM

## 2019-05-30 DIAGNOSIS — K219 Gastro-esophageal reflux disease without esophagitis: Secondary | ICD-10-CM

## 2019-05-30 LAB — CBC
HCT: 34.7 % — ABNORMAL LOW (ref 39.0–52.0)
Hemoglobin: 10.8 g/dL — ABNORMAL LOW (ref 13.0–17.0)
MCH: 30.6 pg (ref 26.0–34.0)
MCHC: 31.1 g/dL (ref 30.0–36.0)
MCV: 98.3 fL (ref 80.0–100.0)
Platelets: 122 10*3/uL — ABNORMAL LOW (ref 150–400)
RBC: 3.53 MIL/uL — ABNORMAL LOW (ref 4.22–5.81)
RDW: 19.9 % — ABNORMAL HIGH (ref 11.5–15.5)
WBC: 11.4 10*3/uL — ABNORMAL HIGH (ref 4.0–10.5)
nRBC: 0.3 % — ABNORMAL HIGH (ref 0.0–0.2)

## 2019-05-30 LAB — BASIC METABOLIC PANEL
Anion gap: 12 (ref 5–15)
BUN: 52 mg/dL — ABNORMAL HIGH (ref 8–23)
CO2: 20 mmol/L — ABNORMAL LOW (ref 22–32)
Calcium: 8.5 mg/dL — ABNORMAL LOW (ref 8.9–10.3)
Chloride: 105 mmol/L (ref 98–111)
Creatinine, Ser: 2.67 mg/dL — ABNORMAL HIGH (ref 0.61–1.24)
GFR calc Af Amer: 28 mL/min — ABNORMAL LOW (ref >60)
GFR calc non Af Amer: 24 mL/min — ABNORMAL LOW (ref >60)
Glucose, Bld: 195 mg/dL — ABNORMAL HIGH (ref 70–99)
Potassium: 3.8 mmol/L (ref 3.5–5.1)
Sodium: 137 mmol/L (ref 135–145)

## 2019-05-30 LAB — GLUCOSE, CAPILLARY
Glucose-Capillary: 135 mg/dL — ABNORMAL HIGH (ref 70–99)
Glucose-Capillary: 169 mg/dL — ABNORMAL HIGH (ref 70–99)
Glucose-Capillary: 181 mg/dL — ABNORMAL HIGH (ref 70–99)
Glucose-Capillary: 142 mg/dL — ABNORMAL HIGH (ref 70–99)

## 2019-05-30 MED ORDER — HYDROCORTISONE NA SUCCINATE PF 100 MG IJ SOLR
50.0000 mg | Freq: Three times a day (TID) | INTRAMUSCULAR | Status: DC
  Administered 2019-05-30 – 2019-06-02 (×9): 50 mg via INTRAVENOUS
  Filled 2019-05-30 (×9): qty 2

## 2019-05-30 NOTE — Progress Notes (Signed)
[facility] Progress Note   Called back to see pt on 05/20/19. He is a 68 year old WM with ICM- EF 40-45%, Afib, DM and also CKD (recent best crt baseline around 2). He was hosp at [facility] from 9/14-9/19- discharged, then readmitted 10/6 to [facility]- S/p cardiac arrest on 10/7. He suffered A on CRF in that setting with crt peaking at 5.17 on 10/10, then improved slowly - renal service signed off. Transferred to the ICU for hypoxia and intubated 10/21 and treated with remdesevir again -> just extubated 10/24.   Assessment/ Plan:    Renal-Baseline CKD- crt 2- This is second event of A on CRF this hosp. Creat / Bun stable, no indication RRT yet. Per CCM feel by CT patient may be intravasc wet, IV lasix started 10/22. Then BUN/ Cr rose significantly so CRRT started 10/24.   - CRRT dc'd 10/29 and now at [facility]  - labs reviewed, creat up 2.6, BUN 52 and K 3.8   - 0- 1L [place], 91- 95%  - I/O even yest, 650 cc UOP, wt's are stable  - possibly recovering function, cont to hold HD for now   SP cardiac arrest - on 10/07  Atrial fib w/ RVR - on po cardizem for rate control  Hypotension - started on midodrine 10 tid  COVID PNA/ underlying COPD/ vol excess- responded well to UF w/ crrt and to lasix and was extubated   Hyperkalemia- resolved  Anemia-not a significant issue   CAD h/o CABG  DM2   Rob Anshi Jalloh  MD 05/30/2019, 3:35 PM    Subjective:    Patient not examined directly given COVID-19 + status, utilizing exam of the primary team and observations of RN's.       Objective:   BP 121/84   Pulse (!) 59   Temp 98.3 F (36.8 C) (Oral)   Resp 20   Ht 5\' 11"  (1.803 m)   Wt 85.3 kg   SpO2 91%   BMI 26.23 kg/m   Intake/Output Summary (Last 24 hours) at 05/30/2019 1535 Last data filed at 05/30/2019 1043 Gross per 24 hour  Intake 996 ml  Output 200 ml  Net 796 ml   Weight change:   Physical Exam:  Patient not examined directly given COVID-19 +  status, utilizing exam of the primary team and observations of RN's.    Imaging: No results found.  Labs: BMET Recent Labs  Lab 05/25/19 0500 05/25/19 1540 05/26/19 0010 05/26/19 1530 05/27/19 0615 05/27/19 1605 05/28/19 0504 05/29/19 0605 05/30/19 1112  NA 141 135 135 137 139 135 136 138 137  K 4.2 4.3 4.1 4.4 5.4* 4.1 3.8 3.8 3.8  CL 106 103 103 105 104 105 102 105 105  CO2 25 25 24 27 26 23 27 24  20*  GLUCOSE 82 177* 94 108* 158* 215* 150* 175* 195*  BUN 51* 39* 33* 22 21 19 20  38* 52*  CREATININE 1.63* 1.42* 1.62* 1.20 1.33* 1.22 1.26* 2.04* 2.67*  CALCIUM 8.2* 7.7* 8.2* 7.7* 8.1* 7.6* 8.1* 8.5* 8.5*  PHOS 2.6 2.3* 2.0* 2.9 3.3 2.2* 2.4*  2.4*  --   --    CBC Recent Labs  Lab 05/27/19 0400 05/28/19 0504 05/29/19 0605 05/30/19 1112  WBC 11.9* 12.5* 10.7* 11.4*  HGB 10.1* 10.5* 10.2* 10.8*  HCT 34.6* 35.1* 32.8* 34.7*  MCV 102.4* 102.3* 101.5* 98.3  PLT 109* 103* 108* 122*    Medications:    . amiodarone  200 mg  Oral BID  . calcitRIOL  0.25 mcg Oral Daily  . Chlorhexidine Gluconate Cloth  6 each Topical Daily  . diltiazem  30 mg Oral Q6H  . feeding supplement (NEPRO CARB STEADY)  237 mL Oral BID BM  . hydrocortisone sodium succinate  50 mg Intravenous Q6H  . insulin aspart  0-5 Units Subcutaneous QHS  . insulin aspart  0-9 Units Subcutaneous TID WC  . insulin glargine  12 Units Subcutaneous QHS  . Ipratropium-Albuterol  1 puff Inhalation Q6H  . levETIRAcetam  500 mg Oral BID  . Melatonin  6 mg Oral QHS  . midodrine  10 mg Oral Q8H  . mometasone-formoterol  2 puff Inhalation BID  . pantoprazole  40 mg Oral Daily

## 2019-05-30 NOTE — Progress Notes (Signed)
Spoke with pharmacist re: restarting heparin. Aware urine is bloody. Will hold heparin and pharmacist will contact physician.

## 2019-05-30 NOTE — Progress Notes (Signed)
2115 Report called to receiving nurse at [facility].   2200 Transport via [place] requested  0030 [facility] at bedside, report given.  0045 Roll call given to receiving RN  Phone, tablet, chargers placed in patient belongings bag and sent with patients.  Dylan Goodman did not have anything else with him.

## 2019-05-30 NOTE — Progress Notes (Signed)
PROGRESS NOTE  Dylan Goodman RJJ:[id number] DOB: 1951-06-24 DOA: 05/06/2019 PCP: Steele Sizer, MD  HPI/Recap of past 19 hours: 68 year old with a history of atrial fibrillation, GIB, CAD status post CABG, nasopharyngeal cancer, CKD, DM, and GERD who was previously treated for Covid pneumonia at [facility] and discharged on 9/19.  On 10/6, returned to the [facility] ED with reported seizure-like behavior, and subsequently suffered a cardiac arrest. He was intubated and transferred to [facility] ICU.  Patient was extubated and subsequently intubated again on 10/21 and transferred to [facility], extubated on 10/24.  Patient was [facility] for possible intermittent hemodialysis and CIR evaluation   Today, patient denies any new complaints, still having hematuria, denies any worsening shortness of breath or cough, abdominal pain, nausea/vomiting, fever/chills.  Assessment/Plan: Principal Problem:   Acute respiratory failure with hypoxemia (HCC) Active Problems:   GERD (gastroesophageal reflux disease)   Hypomagnesemia   Acute kidney injury superimposed on chronic kidney disease (HCC)   Atrial fibrillation with RVR ([place])   COVID-19 virus infection   Respiratory failure, acute (HCC)   Cardiac arrest (HCC)   Prolonged QT interval   Seizure (HCC)   Acute respiratory disease due to COVID-19 virus   Hypoxia   Pressure injury of skin   Acute pulmonary edema (HCC)  Acute hypoxic respiratory failure/pneumonia due to COVID-19 virus Currently afebrile, with leukocytosis Patient has 2+ Covid test on 04/12/2019, and 05/04/2019, repeat is pending Currently saturating above 90% on room air, supplemental oxygen as needed Patient has completed course of remdesivir and steroids, however on stress dose steroids due to concern for adrenal insufficiency Continue Solu-Cortef, start weaning off slowly Continue inhalers Monitor very closely  Hypotension/possible adrenal  insufficiency BP improving Unclear etiology for hypotension, ??  Worsened by CRRT Currently off pressors Continue midodrine, continue Solu-Cortef with slow taper  Chronic A. fib with RVR Asymptomatic Heart rate somewhat controlled, in the early 100s Echo done on 10/9 showed EF of 45 to 50%, improved from previous on 03/27/2019 which was 35 to 40% Patient had been followed by cardiology, but currently signed off May consult cardiology if anything acute changes from a cardiology standpoint Continue Cardizem, amiodarone, hold off on [facility] with heparin drip due to significant hematuria  Hematuria/acute cystitis Currently afebrile with leukocytosis UA showed large hemoglobin, many bacteria, 6-10 WBC UC pending Started on ceftriaxone empirically Discussed with urology Dr. Tresa Moore, may resume heparin if no significant amount of bleeding noted or if no significant drop in hemoglobin We will continue to hold off heparin drip as patient still has persistent hematuria  Diffuse ST depression noted on EKG Asymptomatic As stated, may consult cardiology if anything acute changes, otherwise can be followed up as an outpatient  Acute on chronic systolic HF Appears euvolemic Echo showed EF of 45 to 50% on 10/9 Volume was managed by CRRT, plans to start intermittent HD  AKI Was on CRRT, nephrology on board Plan for intermittent HD  History of carotid artery disease/peripheral vascular disease Was on Pletal, Brilinta as well as Eliquis Currently all on hold due to increased risk of bleeding We will plan to reinitiate once okay by cardiology/urology  Single episode of seizure No further episodes Continue Keppra  Diabetes mellitus type 2 Last A1c 6.7 SSI, Lantus, Accu-Cheks, hypoglycemic protocol  GERD Continue PPI  Deconditioning PT/OT CIR recommended         Malnutrition Type:  Nutrition Problem: Increased nutrient needs Etiology: acute illness   Malnutrition  Characteristics:   Signs/Symptoms: estimated needs   Nutrition Interventions:  Interventions: Refer to RD note for recommendations    Estimated body mass index is 26.23 kg/m as calculated from the following:   Height as of this encounter: 5\' 11"  (1.803 m).   Weight as of this encounter: 85.3 kg.     Code Status: Full  Family Communication: None at bedside  Disposition Plan: CIR   Consultants:  PCCM  Nephrology  Urology  Procedures:  Mechanical ventilation as mentioned above  Antimicrobials:  None  DVT prophylaxis: SCD   Objective: Vitals:   05/29/19 2300 05/30/19 0000 05/30/19 0031 05/30/19 0118  BP: 110/81 121/84 121/84   Pulse: 93 (!) 59 (!) 59   Resp: 20 20 20    Temp:   98.1 F (36.7 C) 98.3 F (36.8 C)  TempSrc:    Oral  SpO2: 96% 91% 91%   Weight:      Height:        Intake/Output Summary (Last 24 hours) at 05/30/2019 1707 Last data filed at 05/30/2019 1043 Gross per 24 hour  Intake 996 ml  Output 200 ml  Net 796 ml   Filed Weights   05/27/19 0132 05/28/19 0500 05/29/19 0451  Weight: 86.7 kg 85.2 kg 85.3 kg    Exam:  General: NAD   Cardiovascular: S1, S2 present  Respiratory:  Coarse breath sounds bilaterally with bibasilar crackles  Abdomen: Soft, nontender, nondistended, bowel sounds present  Musculoskeletal: No bilateral pedal edema noted  Skin: Normal  Psychiatry: Normal mood   Data Reviewed: CBC: Recent Labs  Lab 05/26/19 0010 05/27/19 0400 05/28/19 0504 05/29/19 0605 05/30/19 1112  WBC 10.7* 11.9* 12.5* 10.7* 11.4*  HGB 13.6 10.1* 10.5* 10.2* 10.8*  HCT 45.6 34.6* 35.1* 32.8* 34.7*  MCV 102.0* 102.4* 102.3* 101.5* 98.3  PLT 125* 109* 103* 108* 161*   Basic Metabolic Panel: Recent Labs  Lab 05/24/19 0534  05/25/19 0500  05/26/19 0010 05/26/19 1530 05/27/19 0615 05/27/19 1605 05/28/19 0504 05/29/19 0605 05/30/19 1112  NA 145   < > 141   < > 135 137 139 135 136 138 137  K 3.7   < > 4.2   < > 4.1 4.4 5.4* 4.1 3.8  3.8 3.8  CL 107   < > 106   < > 103 105 104 105 102 105 105  CO2 27   < > 25   < > 24 27 26 23 27 24  20*  GLUCOSE 130*   < > 82   < > 94 108* 158* 215* 150* 175* 195*  BUN 97*   < > 51*   < > 33* 22 21 19 20  38* 52*  CREATININE 2.18*   < > 1.63*   < > 1.62* 1.20 1.33* 1.22 1.26* 2.04* 2.67*  CALCIUM 8.3*   < > 8.2*   < > 8.2* 7.7* 8.1* 7.6* 8.1* 8.5* 8.5*  MG 2.1  --  2.2  --  2.2  --  2.1  --  2.1  --   --   PHOS  --    < > 2.6   < > 2.0* 2.9 3.3 2.2* 2.4*  2.4*  --   --    < > = values in this interval not displayed.   GFR: Estimated Creatinine Clearance: 29.4 mL/min (A) (by C-G formula based on SCr of 2.67 mg/dL (H)). Liver Function Tests: Recent Labs  Lab 05/24/19 0534  05/26/19 1530 05/27/19 0400 05/27/19 0615 05/27/19 1605 05/28/19  0504  AST 60*  --   --  49*  --   --   --   ALT 121*  --   --  78*  --   --   --   ALKPHOS 235*  --   --  320*  --   --   --   BILITOT 2.0*  --   --  1.3*  --   --   --   PROT 5.8*  --   --  4.7*  --   --   --   ALBUMIN 2.6*   < > 2.0* 2.1* 2.3* 2.2* 2.4*   < > = values in this interval not displayed.   No results for input(s): LIPASE, AMYLASE in the last 168 hours. No results for input(s): AMMONIA in the last 168 hours. Coagulation Profile: No results for input(s): INR, PROTIME in the last 168 hours. Cardiac Enzymes: No results for input(s): CKTOTAL, CKMB, CKMBINDEX, TROPONINI in the last 168 hours. BNP (last 3 results) No results for input(s): PROBNP in the last 8760 hours. HbA1C: No results for input(s): HGBA1C in the last 72 hours. CBG: Recent Labs  Lab 05/29/19 0746 05/29/19 1110 05/29/19 1606 05/30/19 0913 05/30/19 1234  GLUCAP 164* 135* 142* 135* 169*   Lipid Profile: No results for input(s): CHOL, HDL, LDLCALC, TRIG, CHOLHDL, LDLDIRECT in the last 72 hours. Thyroid Function Tests: No results for input(s): TSH, T4TOTAL, FREET4, T3FREE, THYROIDAB in the last 72 hours. Anemia Panel: No results for input(s): VITAMINB12,  FOLATE, FERRITIN, TIBC, IRON, RETICCTPCT in the last 72 hours. Urine analysis:    Component Value Date/Time   COLORURINE YELLOW 05/29/2019 1215   APPEARANCEUR CLEAR 05/29/2019 1215   LABSPEC 1.005 05/29/2019 1215   PHURINE 5.0 05/29/2019 1215   GLUCOSEU NEGATIVE 05/29/2019 1215   HGBUR LARGE (A) 05/29/2019 1215   [place] 05/29/2019 1215   [place] 05/29/2019 1215   PROTEINUR NEGATIVE 05/29/2019 1215   NITRITE NEGATIVE 05/29/2019 1215   LEUKOCYTESUR TRACE (A) 05/29/2019 1215   Sepsis Labs: @LABRCNTIP (procalcitonin:4,lacticidven:4)  ) Recent Results (from the past 240 hour(s))  Culture, Urine     Status: Abnormal (Preliminary result)   Collection Time: 05/29/19  3:14 PM   Specimen: Urine, Random  Result Value Ref Range Status   Specimen Description   Final    URINE, RANDOM Performed at [facility], [place] [address] [place], [place] [address]    Special Requests   Final    NONE Performed at [facility], [place] [address] [place],  [address]    Culture >=100,000 COLONIES/mL GRAM NEGATIVE RODS (A)  Final   Report Status PENDING  Incomplete      Studies: No results found.  Scheduled Meds: . amiodarone  200 mg Oral BID  . calcitRIOL  0.25 mcg Oral Daily  . Chlorhexidine Gluconate Cloth  6 each Topical Daily  . diltiazem  30 mg Oral Q6H  . feeding supplement (NEPRO CARB STEADY)  237 mL Oral BID BM  . hydrocortisone sodium succinate  50 mg Intravenous Q6H  . insulin aspart  0-5 Units Subcutaneous QHS  . insulin aspart  0-9 Units Subcutaneous TID WC  . insulin glargine  12 Units Subcutaneous QHS  . Ipratropium-Albuterol  1 puff Inhalation Q6H  . levETIRAcetam  500 mg Oral BID  . Melatonin  6 mg Oral QHS  . midodrine  10 mg Oral Q8H  . mometasone-formoterol  2 puff Inhalation BID  . pantoprazole  40 mg Oral Daily    Continuous Infusions: . cefTRIAXone (ROCEPHIN)  IV 2 g (05/30/19 1355)      LOS: 24 days     Alma Friendly, MD [facility]  If 7PM-7AM, please contact night-coverage [email or web address] 05/30/2019, 5:07 PM

## 2019-05-31 LAB — GLUCOSE, CAPILLARY
Glucose-Capillary: 150 mg/dL — ABNORMAL HIGH (ref 70–99)
Glucose-Capillary: 136 mg/dL — ABNORMAL HIGH (ref 70–99)
Glucose-Capillary: 211 mg/dL — ABNORMAL HIGH (ref 70–99)
Glucose-Capillary: 175 mg/dL — ABNORMAL HIGH (ref 70–99)

## 2019-05-31 LAB — NOVEL CORONAVIRUS, NAA (HOSP ORDER, SEND-OUT TO REF LAB; TAT 18-24 HRS): SARS-CoV-2, NAA: NOT DETECTED

## 2019-05-31 LAB — CBC
HCT: 33.7 % — ABNORMAL LOW (ref 39.0–52.0)
Hemoglobin: 10.3 g/dL — ABNORMAL LOW (ref 13.0–17.0)
MCH: 30.5 pg (ref 26.0–34.0)
MCHC: 30.6 g/dL (ref 30.0–36.0)
MCV: 99.7 fL (ref 80.0–100.0)
Platelets: 109 10*3/uL — ABNORMAL LOW (ref 150–400)
RBC: 3.38 MIL/uL — ABNORMAL LOW (ref 4.22–5.81)
RDW: 20 % — ABNORMAL HIGH (ref 11.5–15.5)
WBC: 9.2 10*3/uL (ref 4.0–10.5)
nRBC: 0 % (ref 0.0–0.2)

## 2019-05-31 LAB — BASIC METABOLIC PANEL
Anion gap: 11 (ref 5–15)
BUN: 58 mg/dL — ABNORMAL HIGH (ref 8–23)
CO2: 22 mmol/L (ref 22–32)
Calcium: 8.4 mg/dL — ABNORMAL LOW (ref 8.9–10.3)
Chloride: 107 mmol/L (ref 98–111)
Creatinine, Ser: 2.7 mg/dL — ABNORMAL HIGH (ref 0.61–1.24)
GFR calc Af Amer: 27 mL/min — ABNORMAL LOW (ref >60)
GFR calc non Af Amer: 24 mL/min — ABNORMAL LOW (ref >60)
Glucose, Bld: 150 mg/dL — ABNORMAL HIGH (ref 70–99)
Potassium: 3.6 mmol/L (ref 3.5–5.1)
Sodium: 140 mmol/L (ref 135–145)

## 2019-05-31 MED ORDER — SODIUM CHLORIDE 0.9 % IV SOLN
INTRAVENOUS | Status: DC
  Administered 2019-05-31 – 2019-06-01 (×3): via INTRAVENOUS

## 2019-05-31 MED ORDER — SODIUM CHLORIDE 0.9 % IV SOLN
1.0000 g | Freq: Two times a day (BID) | INTRAVENOUS | Status: AC
  Administered 2019-05-31 – 2019-06-04 (×10): 1 g via INTRAVENOUS
  Filled 2019-05-31 (×10): qty 1

## 2019-05-31 NOTE — Progress Notes (Signed)
Pharmacy Antibiotic Note  Dylan Goodman is a 68 y.o. male COVID positive patient admitted on 05/06/2019 with cardiac arrest and seizure-like activity, later transferred from [place] to [facility]. Patient had significant hematuria 10/30-10/31 (also was on heparin infusion at the time) and ceftriaxone started per urology for suspected UTI. Now urine culture showing ESBL klebsiella pneumoniae and enterococcus faecium. Pharmacy has been consulted for meropenem dosing.   Today, WBC wnl and patient afebrile but complaining of dysuria. RN is unable to assess hematuria as patient has not voided today. SCr stable from yesterday and roughly at baseline (SCr 2-3) off CRRT/HD.   Plan: Stop ceftriaxone Start meropenem 1g IV q12h x 5 days  Monitor clinical picture, renal function F/U C&S, abx de-escalation, LOT   Height: 5\' 11"  (180.3 cm) Weight: 192 lb 8 oz (87.3 kg) IBW/kg (Calculated) : 75.3  Temp (24hrs), Avg:98.3 F (36.8 C), Min:97.7 F (36.5 C), Max:98.8 F (37.1 C)  Recent Labs  Lab 05/27/19 0400  05/27/19 1605 05/28/19 0504 05/29/19 0605 05/30/19 1112 05/31/19 0534  WBC 11.9*  --   --  12.5* 10.7* 11.4* 9.2  CREATININE  --    < > 1.22 1.26* 2.04* 2.67* 2.70*   < > = values in this interval not displayed.    Estimated Creatinine Clearance: 29.1 mL/min (A) (by C-G formula based on SCr of 2.7 mg/dL (H)).    No Known Allergies  Antimicrobials this admission: 9/14 Remdesivir >> 9/18 10/7 Zosyn >> 10/11 10/6 Vanc >>10/8 10/6 Cefepime >>10/7; 10/20 >> 10/22 10/20 Remdesivir >> 10/24 10/30 Ceftriaxone > 11/1 11/1 meropenem> (11/5)  Microbiology results: 10/31 COVID: sent 10/30 UCx ESBL Kleb pneumo, E Faecium (no sens yet) 10/8 Trach aspirate> rare budding yeast, normal flora 10/7 UCx neg 10/7 Bcx: neg 10/6 BCx neg 10/7 MRSA PCR: neg 10/5 covid positive 9/13 covid positive  Thank you for allowing pharmacy to be a part of this patient's care.   Brendolyn Patty, PharmD PGY2  Pharmacy Resident Phone [phone]  05/31/2019   1:58 PM

## 2019-05-31 NOTE — Progress Notes (Signed)
[facility] Progress Note   Called back to see pt on 05/20/19. He is a 68 year old WM with ICM- EF 40-45%, Afib, DM and also CKD (recent best crt baseline around 2). He was hosp at [facility] from 9/14-9/19- discharged, then readmitted 10/6 to [facility]- S/p cardiac arrest on 10/7. He suffered A on CRF in that setting with crt peaking at 5.17 on 10/10, then improved slowly - renal service signed off. Transferred to the ICU for hypoxia and intubated 10/21 and treated with remdesevir again -> just extubated 10/24.   Assessment/ Plan:    Renal-Baseline CKD- crt 2- This is second event of A on CRF this hosp. Creat / Bun stable, no indication RRT yet. Per CCM feel by CT patient may be intravasc wet, IV lasix started 10/22. Then BUN/ Cr rose significantly so CRRT started 10/24.   - CRRT dc'd 10/29 and now at [facility]  - creat up minimally 2.70, BUN 58, making urine  - doubt collecting all urine   - on room air, 91- 95%  - cont to hold iHD for now, no indication for RRT today  - euvolemic on exam, will give gentle IVF"s 65/ hr NS for 1-2 d   Klebsiella UTI +ESBL - symptomatic, starting on maxipime IV today  SP cardiac arrest - on 10/07  Atrial fib w/ RVR - on po cardizem 30 qid + po amio  Seizure d/o -  On keppra  Hypotension - started on midodrine 10 tid,  taper if BP's go up  COVID PNA/ underlying COPD - stable now, on 0-1 L [place]  Hyperkalemia- resolved  Anemia-not a significant issue   CAD h/o CABG  DM2   [name]  MD 05/31/2019, 2:14 PM    Subjective:    seen in room, pt in good spirits, no sob or cough, eating full meals and drinking,  C/o marked dysuria "with every drop I pee", had UTI 25 yrs ago and feels the same.  Voided 270 cc light brownish urine while I was in the room.       Objective:   BP 140/86 (BP Location: Left Arm)   Pulse 96   Temp 97.7 F (36.5 C) (Oral)   Resp 12   Ht 5\' 11"  (1.803 m)   Wt 87.3 kg   SpO2 90%   BMI 26.85  kg/m   Intake/Output Summary (Last 24 hours) at 05/31/2019 1414 Last data filed at 05/31/2019 1400 Gross per 24 hour  Intake 490 ml  Output 270 ml  Net 220 ml   Weight change:   Physical Exam:  Patient not examined directly given COVID-19 + status, utilizing exam of the primary team and observations of RN's.    Imaging: No results found.  Labs: BMET Recent Labs  Lab 05/25/19 0500 05/25/19 1540 05/26/19 0010 05/26/19 1530 05/27/19 0615 05/27/19 1605 05/28/19 0504 05/29/19 0605 05/30/19 1112 05/31/19 0534  NA 141 135 135 137 139 135 136 138 137 140   K 4.2 4.3 4.1 4.4 5.4* 4.1 3.8 3.8 3.8 3.6  CL 106 103 103 105 104 105 102 105 105 107   CO2 25 25 24 27 26 23 27 24  20* 22  GLUCOSE 82 177* 94 108* 158* 215* 150* 175* 195* 150*  BUN 51* 39* 33* 22 21 19 20  38* 52* 58*  CREATININE 1.63* 1.42* 1.62* 1.20 1.33* 1.22 1.26* 2.04* 2.67* 2.70*  CALCIUM 8.2* 7.7* 8.2* 7.7* 8.1* 7.6* 8.1* 8.5* 8.5* 8.4*  PHOS 2.6 2.3*  2.0* 2.9 3.3 2.2* 2.4*  2.4*  --   --   --    CBC Recent Labs  Lab 05/28/19 0504 05/29/19 0605 05/30/19 1112 05/31/19 0534  WBC 12.5* 10.7* 11.4* 9.2  HGB 10.5* 10.2* 10.8* 10.3*  HCT 35.1* 32.8* 34.7* 33.7*  MCV 102.3* 101.5* 98.3 99.7  PLT 103* 108* 122* 109*    Medications:    . amiodarone  200 mg Oral BID  . calcitRIOL  0.25 mcg Oral Daily  . Chlorhexidine Gluconate Cloth  6 each Topical Daily  . diltiazem  30 mg Oral Q6H  . feeding supplement (NEPRO CARB STEADY)  237 mL Oral BID BM  . hydrocortisone sodium succinate  50 mg Intravenous Q8H  . insulin aspart  0-5 Units Subcutaneous QHS  . insulin aspart  0-9 Units Subcutaneous TID WC  . insulin glargine  12 Units Subcutaneous QHS  . Ipratropium-Albuterol  1 puff Inhalation Q6H  . levETIRAcetam  500 mg Oral BID  . Melatonin  6 mg Oral QHS  . midodrine  10 mg Oral Q8H  . mometasone-formoterol  2 puff Inhalation BID  . pantoprazole  40 mg Oral Daily

## 2019-05-31 NOTE — Progress Notes (Signed)
PROGRESS NOTE  Dylan Goodman FIE:[id number] DOB: Feb 09, 1951 DOA: 05/06/2019 PCP: Steele Sizer, MD  HPI/Recap of past 65 hours: 68 year old with a history of atrial fibrillation, GIB, CAD status post CABG, nasopharyngeal cancer, CKD, DM, and GERD who was previously treated for Covid pneumonia at [facility] and discharged on 9/19.  On 10/6, returned to the [facility] ED with reported seizure-like behavior, and subsequently suffered a cardiac arrest. He was intubated and transferred to [facility] ICU.  Patient was extubated and subsequently intubated again on 10/21 and transferred to [facility], extubated on 10/24.  Patient was [facility] for possible intermittent hemodialysis and CIR evaluation   Today, patient denies any new complaints, denies any worsening shortness of breath or cough, no fever or chills noted.  Assessment/Plan: Principal Problem:   Acute respiratory failure with hypoxemia (HCC) Active Problems:   GERD (gastroesophageal reflux disease)   Hypomagnesemia   Acute kidney injury superimposed on chronic kidney disease (HCC)   Atrial fibrillation with RVR ([place])   COVID-19 virus infection   Respiratory failure, acute (HCC)   Cardiac arrest (HCC)   Prolonged QT interval   Seizure (HCC)   Acute respiratory disease due to COVID-19 virus   Hypoxia   Pressure injury of skin   Acute pulmonary edema (HCC)  Acute hypoxic respiratory failure/pneumonia due to COVID-19 virus Currently afebrile, with no leukocytosis Patient has 2+ Covid test on 04/12/2019, and 05/04/2019, repeat is pending Currently saturating above 90% on room air, supplemental oxygen as needed Patient has completed course of remdesivir and steroids, however on stress dose steroids due to concern for adrenal insufficiency Continue Solu-Cortef, start weaning off slowly Continue inhalers Monitor very closely  Hypotension/possible adrenal insufficiency BP stable Unclear etiology for  hypotension, ??  Worsened by CRRT Currently off pressors Continue midodrine, continue Solu-Cortef with slow taper  Chronic A. fib with RVR Asymptomatic Heart rate somewhat controlled Echo done on 10/9 showed EF of 45 to 50%, improved from previous on 03/27/2019 which was 35 to 40% Patient had been followed by cardiology, but currently signed off May consult cardiology if anything acute changes from a cardiology standpoint Continue Cardizem, amiodarone, hold off on [facility] with heparin drip due to significant hematuria  ESBL UTI Currently afebrile with leukocytosis UA showed large hemoglobin, many bacteria, 6-10 WBC UC grew ESBL Klebsiella Was on ceftriaxone empirically, will switch to meropenem Discussed with urology Dr. Tresa Moore, may resume heparin if no significant amount of bleeding noted or if no significant drop in hemoglobin We will continue to hold off heparin drip as patient still has persistent hematuria  Diffuse ST depression noted on EKG Asymptomatic As stated, may consult cardiology if anything acute changes, otherwise can be followed up as an outpatient  Acute on chronic systolic HF Appears euvolemic Echo showed EF of 45 to 50% on 10/9 Volume was managed by CRRT, plans to start intermittent HD  AKI Was on CRRT, nephrology on board Plan for intermittent HD  History of carotid artery disease/peripheral vascular disease Was on Pletal, Brilinta as well as Eliquis Currently all on hold due to increased risk of bleeding We will plan to reinitiate once okay by cardiology/urology  Single episode of seizure No further episodes Continue Keppra  Diabetes mellitus type 2 Last A1c 6.7 SSI, Lantus, Accu-Cheks, hypoglycemic protocol  GERD Continue PPI  Deconditioning PT/OT CIR recommended         Malnutrition Type:  Nutrition Problem: Increased nutrient needs Etiology: acute illness  Malnutrition Characteristics:  Signs/Symptoms: estimated needs   Nutrition  Interventions:  Interventions: Refer to RD note for recommendations    Estimated body mass index is 26.85 kg/m as calculated from the following:   Height as of this encounter: 5\' 11"  (1.803 m).   Weight as of this encounter: 87.3 kg.     Code Status: Full  Family Communication: None at bedside  Disposition Plan: CIR   Consultants:  PCCM  Nephrology  Urology  Procedures:  Mechanical ventilation as mentioned above  Antimicrobials:  Starting meropenem on 11/1  DVT prophylaxis: SCD   Objective: Vitals:   05/30/19 1735 05/30/19 2304 05/31/19 0500 05/31/19 0823  BP: 125/80 (!) 133/92  140/86  Pulse: (!) 105 86  96  Resp: 17 20  12   Temp:  98.8 F (37.1 C)  97.7 F (36.5 C)  TempSrc:    Oral  SpO2: 92% 92%  90%  Weight:   87.3 kg   Height:        Intake/Output Summary (Last 24 hours) at 05/31/2019 1321 Last data filed at 05/31/2019 1000 Gross per 24 hour  Intake 740 ml  Output 200 ml  Net 540 ml   Filed Weights   05/28/19 0500 05/29/19 0451 05/31/19 0500  Weight: 85.2 kg 85.3 kg 87.3 kg    Exam:  General: NAD   Cardiovascular: S1, S2 present  Respiratory:  Coarse breath sounds bilateral  Abdomen: Soft, nontender, nondistended, bowel sounds present  Musculoskeletal: No bilateral pedal edema noted  Skin: Normal  Psychiatry: Normal mood   Data Reviewed: CBC: Recent Labs  Lab 05/27/19 0400 05/28/19 0504 05/29/19 0605 05/30/19 1112 05/31/19 0534  WBC 11.9* 12.5* 10.7* 11.4* 9.2  HGB 10.1* 10.5* 10.2* 10.8* 10.3*  HCT 34.6* 35.1* 32.8* 34.7* 33.7*  MCV 102.4* 102.3* 101.5* 98.3 99.7  PLT 109* 103* 108* 122* 242*   Basic Metabolic Panel: Recent Labs  Lab 05/25/19 0500  05/26/19 0010 05/26/19 1530 05/27/19 0615 05/27/19 1605 05/28/19 0504 05/29/19 0605 05/30/19 1112 05/31/19 0534  NA 141   < > 135 137 139 135 136 138 137 140  K 4.2   < > 4.1 4.4 5.4* 4.1 3.8 3.8 3.8 3.6  CL 106   < > 103 105 104 105 102 105 105 107  CO2 25    < > 24 27 26 23 27 24  20* 22  GLUCOSE 82   < > 94 108* 158* 215* 150* 175* 195* 150*  BUN 51*   < > 33* 22 21 19 20  38* 52* 58*  CREATININE 1.63*   < > 1.62* 1.20 1.33* 1.22 1.26* 2.04* 2.67* 2.70*  CALCIUM 8.2*   < > 8.2* 7.7* 8.1* 7.6* 8.1* 8.5* 8.5* 8.4*  MG 2.2  --  2.2  --  2.1  --  2.1  --   --   --   PHOS 2.6   < > 2.0* 2.9 3.3 2.2* 2.4*  2.4*  --   --   --    < > = values in this interval not displayed.   GFR: Estimated Creatinine Clearance: 29.1 mL/min (A) (by C-G formula based on SCr of 2.7 mg/dL (H)). Liver Function Tests: Recent Labs  Lab 05/26/19 1530 05/27/19 0400 05/27/19 0615 05/27/19 1605 05/28/19 0504  AST  --  49*  --   --   --   ALT  --  78*  --   --   --   ALKPHOS  --  320*  --   --   --  BILITOT  --  1.3*  --   --   --   PROT  --  4.7*  --   --   --   ALBUMIN 2.0* 2.1* 2.3* 2.2* 2.4*   No results for input(s): LIPASE, AMYLASE in the last 168 hours. No results for input(s): AMMONIA in the last 168 hours. Coagulation Profile: No results for input(s): INR, PROTIME in the last 168 hours. Cardiac Enzymes: No results for input(s): CKTOTAL, CKMB, CKMBINDEX, TROPONINI in the last 168 hours. BNP (last 3 results) No results for input(s): PROBNP in the last 8760 hours. HbA1C: No results for input(s): HGBA1C in the last 72 hours. CBG: Recent Labs  Lab 05/30/19 1234 05/30/19 1733 05/30/19 2044 05/31/19 0820 05/31/19 1155  GLUCAP 169* 181* 142* 150* 136*   Lipid Profile: No results for input(s): CHOL, HDL, LDLCALC, TRIG, CHOLHDL, LDLDIRECT in the last 72 hours. Thyroid Function Tests: No results for input(s): TSH, T4TOTAL, FREET4, T3FREE, THYROIDAB in the last 72 hours. Anemia Panel: No results for input(s): VITAMINB12, FOLATE, FERRITIN, TIBC, IRON, RETICCTPCT in the last 72 hours. Urine analysis:    Component Value Date/Time   COLORURINE YELLOW 05/29/2019 1215   APPEARANCEUR CLEAR 05/29/2019 1215   LABSPEC 1.005 05/29/2019 1215   PHURINE 5.0  05/29/2019 1215   GLUCOSEU NEGATIVE 05/29/2019 1215   HGBUR LARGE (A) 05/29/2019 1215   [place] 05/29/2019 1215   [place] 05/29/2019 1215   PROTEINUR NEGATIVE 05/29/2019 1215   NITRITE NEGATIVE 05/29/2019 1215   LEUKOCYTESUR TRACE (A) 05/29/2019 1215   Sepsis Labs: @LABRCNTIP (procalcitonin:4,lacticidven:4)  ) Recent Results (from the past 240 hour(s))  Culture, Urine     Status: Abnormal (Preliminary result)   Collection Time: 05/29/19  3:14 PM   Specimen: Urine, Random  Result Value Ref Range Status   Specimen Description   Final    URINE, RANDOM Performed at [facility], [place] [address], [place], [place] [address]    Special Requests   Final    NONE Performed at [facility], [place] [address], [place], [place] [address]    Culture (A)  Final    >=100,000 COLONIES/mL KLEBSIELLA PNEUMONIAE Confirmed Extended Spectrum Beta-Lactamase Producer (ESBL).  In bloodstream infections from ESBL organisms, carbapenems are preferred over piperacillin/tazobactam. They are shown to have a lower risk of mortality. >=100,000 COLONIES/mL ENTEROCOCCUS FAECIUM SUSCEPTIBILITIES TO FOLLOW Performed at [facility], [place] [address], [place], [place] [address]    Report Status PENDING  Incomplete   Organism ID, Bacteria KLEBSIELLA PNEUMONIAE (A)  Final      Susceptibility   Klebsiella pneumoniae - MIC*    AMPICILLIN >=32 RESISTANT Resistant     CEFAZOLIN >=64 RESISTANT Resistant     CEFTRIAXONE >=64 RESISTANT Resistant     CIPROFLOXACIN <=0.25 SENSITIVE Sensitive     GENTAMICIN <=1 SENSITIVE Sensitive     IMIPENEM <=0.25 SENSITIVE Sensitive     NITROFURANTOIN 64 INTERMEDIATE Intermediate     TRIMETH/SULFA >=320 RESISTANT Resistant     AMPICILLIN/SULBACTAM >=32 RESISTANT Resistant     PIP/TAZO 8 SENSITIVE Sensitive     Extended ESBL POSITIVE Resistant     * >=100,000 COLONIES/mL KLEBSIELLA PNEUMONIAE      Studies: No  results found.  Scheduled Meds: . amiodarone  200 mg Oral BID  . calcitRIOL  0.25 mcg Oral Daily  . Chlorhexidine Gluconate Cloth  6 each Topical Daily  . diltiazem  30 mg Oral Q6H  . feeding supplement (NEPRO CARB STEADY)  237 mL Oral BID BM  . hydrocortisone sodium succinate  50 mg Intravenous Q8H  . insulin aspart  0-5 Units Subcutaneous QHS  . insulin aspart  0-9 Units Subcutaneous TID WC  . insulin glargine  12 Units Subcutaneous QHS  . Ipratropium-Albuterol  1 puff Inhalation Q6H  . levETIRAcetam  500 mg Oral BID  . Melatonin  6 mg Oral QHS  . midodrine  10 mg Oral Q8H  . mometasone-formoterol  2 puff Inhalation BID  . pantoprazole  40 mg Oral Daily    Continuous Infusions: . cefTRIAXone (ROCEPHIN)  IV 2 g (05/31/19 1225)     LOS: 25 days     Alma Friendly, MD [facility]  If 7PM-7AM, please contact night-coverage [email or web address] 05/31/2019, 1:21 PM

## 2019-06-01 ENCOUNTER — Inpatient Hospital Stay (HOSPITAL_COMMUNITY): Payer: Medicare Other

## 2019-06-01 LAB — BASIC METABOLIC PANEL
Anion gap: 14 (ref 5–15)
BUN: 61 mg/dL — ABNORMAL HIGH (ref 8–23)
CO2: 21 mmol/L — ABNORMAL LOW (ref 22–32)
Calcium: 8.2 mg/dL — ABNORMAL LOW (ref 8.9–10.3)
Chloride: 105 mmol/L (ref 98–111)
Creatinine, Ser: 2.67 mg/dL — ABNORMAL HIGH (ref 0.61–1.24)
GFR calc Af Amer: 28 mL/min — ABNORMAL LOW (ref >60)
GFR calc non Af Amer: 24 mL/min — ABNORMAL LOW (ref >60)
Glucose, Bld: 128 mg/dL — ABNORMAL HIGH (ref 70–99)
Potassium: 3.5 mmol/L (ref 3.5–5.1)
Sodium: 140 mmol/L (ref 135–145)

## 2019-06-01 LAB — GLUCOSE, CAPILLARY
Glucose-Capillary: 270 mg/dL — ABNORMAL HIGH (ref 70–99)
Glucose-Capillary: 86 mg/dL (ref 70–99)
Glucose-Capillary: 79 mg/dL (ref 70–99)
Glucose-Capillary: 104 mg/dL — ABNORMAL HIGH (ref 70–99)
Glucose-Capillary: 113 mg/dL — ABNORMAL HIGH (ref 70–99)

## 2019-06-01 LAB — CBC
HCT: 38.5 % — ABNORMAL LOW (ref 39.0–52.0)
Hemoglobin: 11.7 g/dL — ABNORMAL LOW (ref 13.0–17.0)
MCH: 30.4 pg (ref 26.0–34.0)
MCHC: 30.4 g/dL (ref 30.0–36.0)
MCV: 100 fL (ref 80.0–100.0)
Platelets: 126 10*3/uL — ABNORMAL LOW (ref 150–400)
RBC: 3.85 MIL/uL — ABNORMAL LOW (ref 4.22–5.81)
RDW: 20.4 % — ABNORMAL HIGH (ref 11.5–15.5)
WBC: 10.4 10*3/uL (ref 4.0–10.5)
nRBC: 0.2 % (ref 0.0–0.2)

## 2019-06-01 LAB — URINE CULTURE: Culture: >=100000 — AB

## 2019-06-01 IMAGING — US US RENAL
1 series · 14 of 25 positions shown · non-contrast
Comparison: Renal ultrasound dated [DATE].

CLINICAL DATA: Hematuria.

EXAM:
RENAL / URINARY TRACT ULTRASOUND COMPLETE

[Series 1: us renal · 43 acquisitions, 14 frames shown]
[im 1/43]
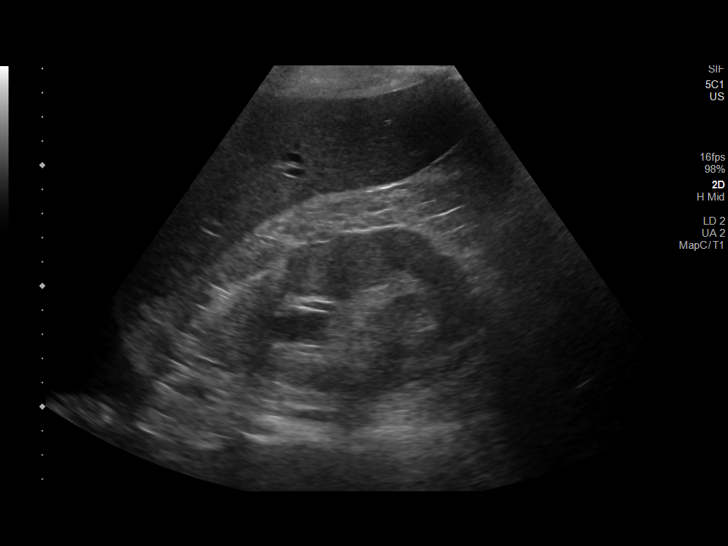
[im 4/43]
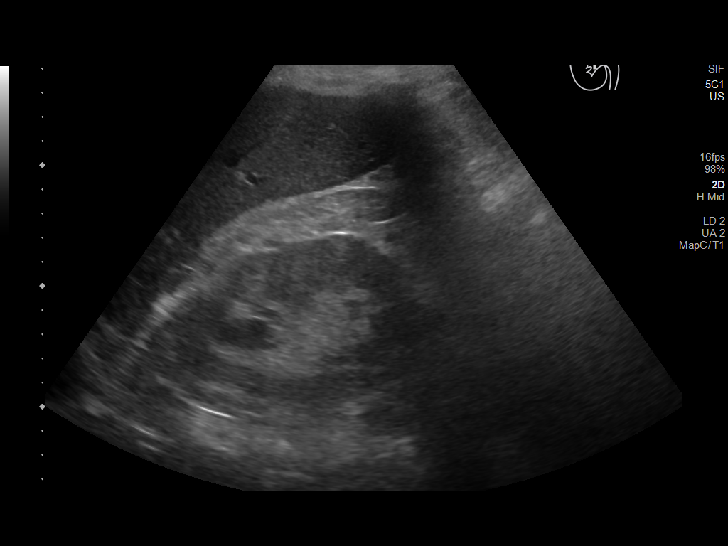
[im 8/43]
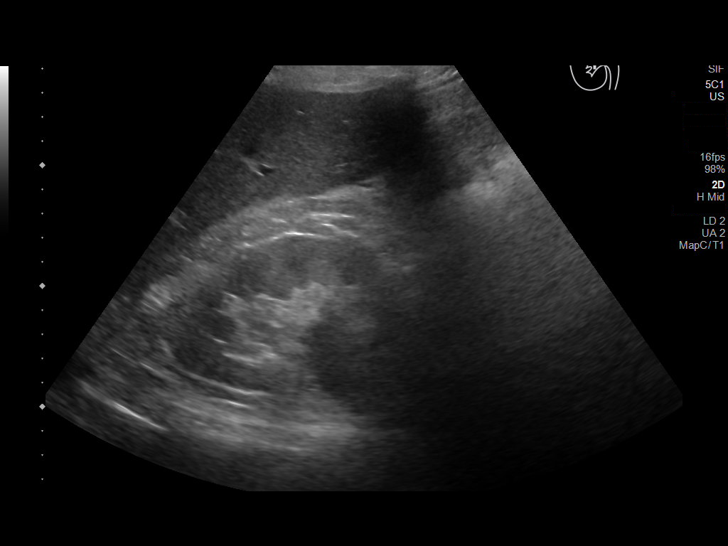
[im 11/43]
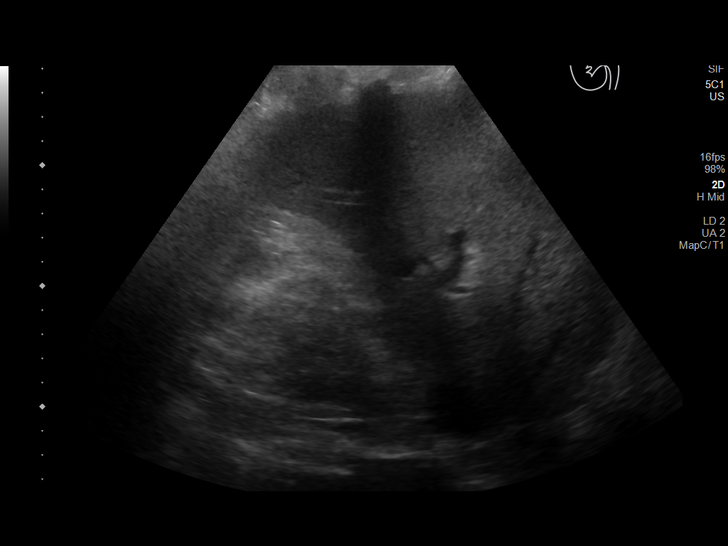
[im 15/43]
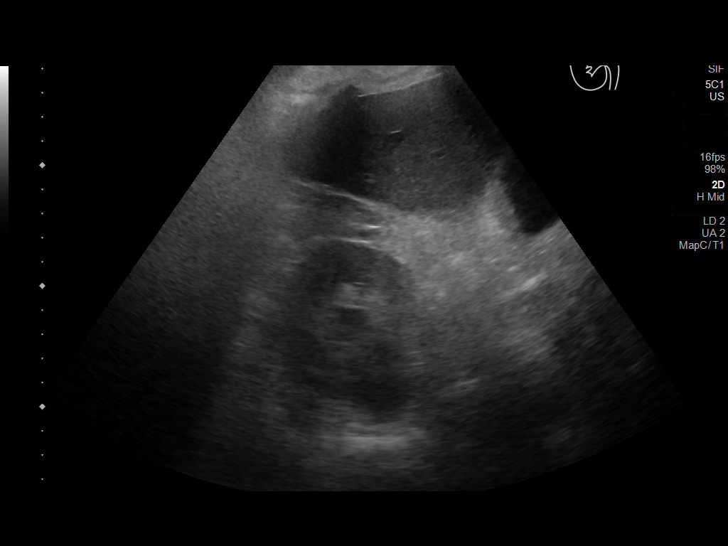
[im 16/43]
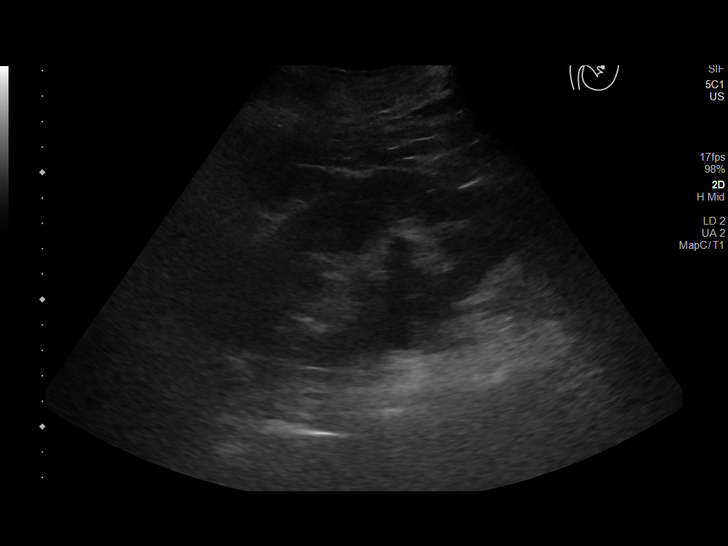
[im 20/43]
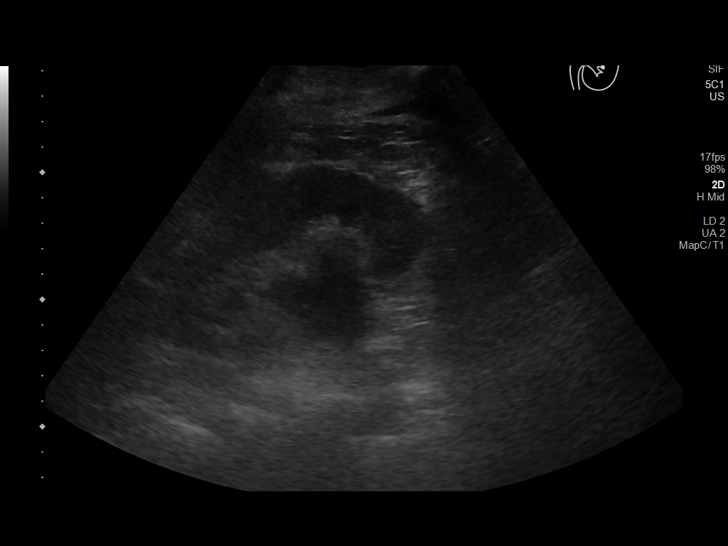
[im 23/43]
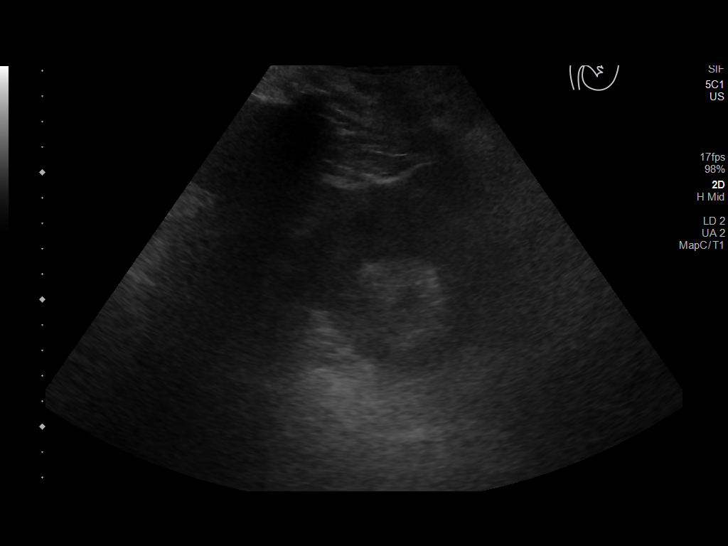
[im 27/43]
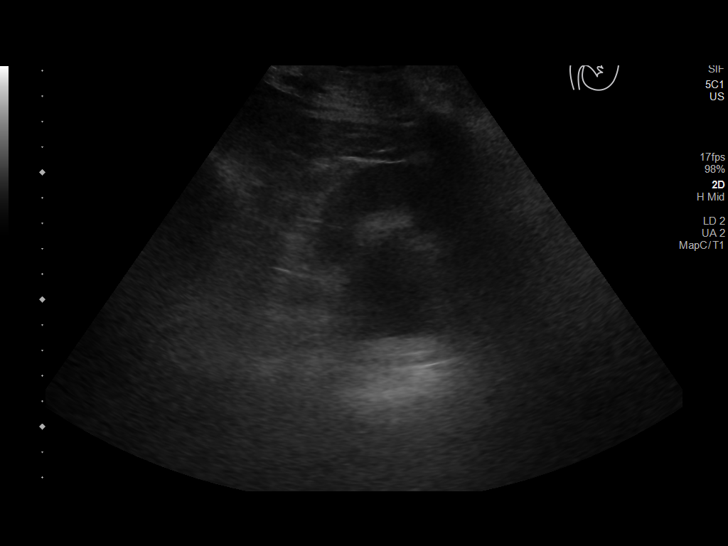
[im 29/43]
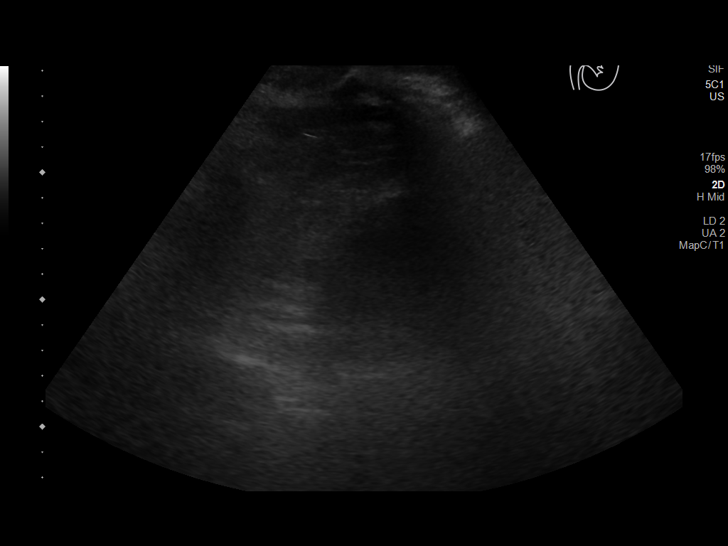
[im 32/43]
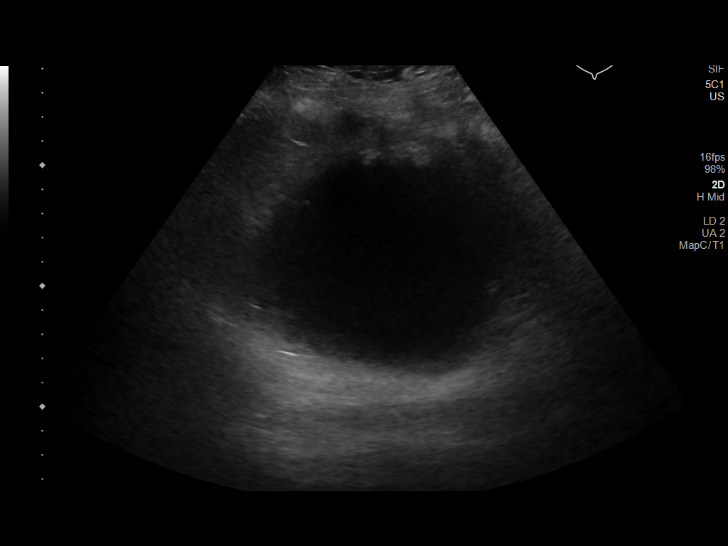
[im 36/43]
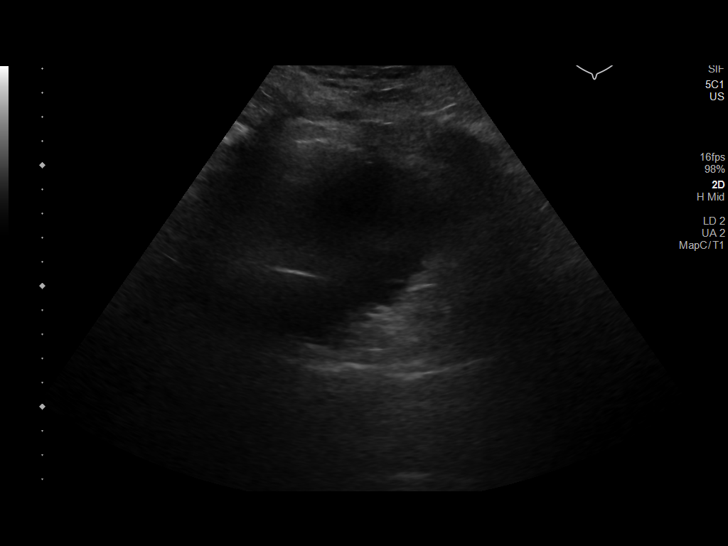
[im 39/43]
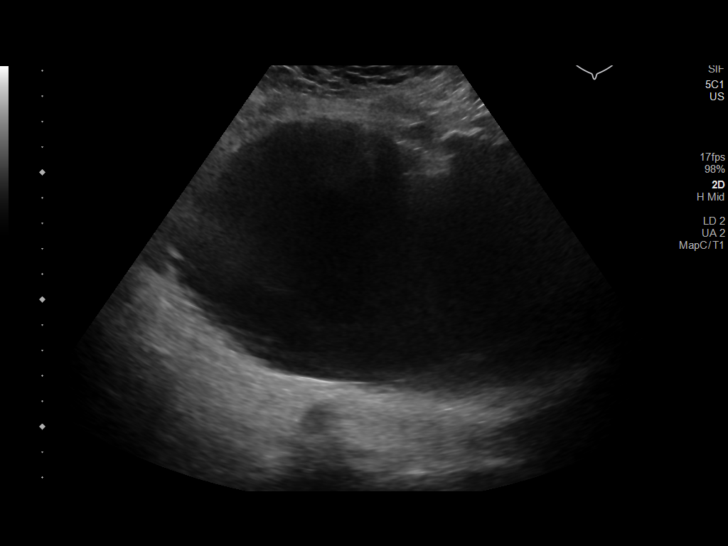
[im 43/43]
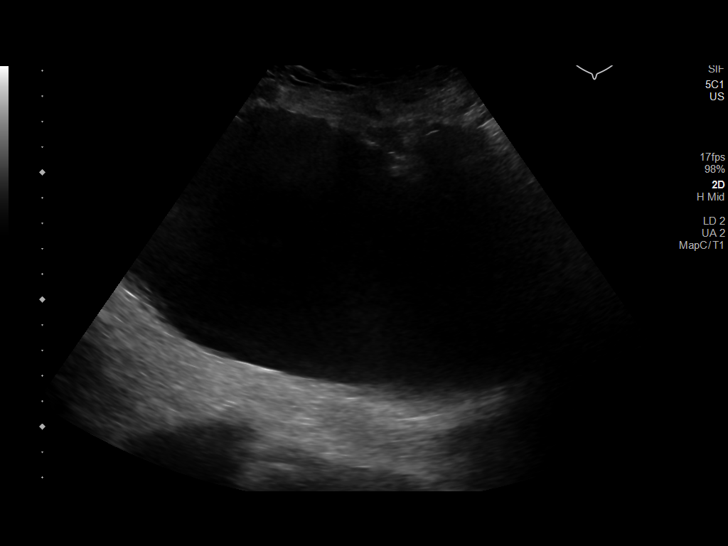

[14 of 25 positions shown; findings below may reference images not displayed]

FINDINGS: Right Kidney:

Renal measurements: 11.8 x 6.8 x 5.8 cm = volume: 243 mL. Mildly
increased echogenicity. Mild hydronephrosis. No mass visualized.

Left Kidney:

Renal measurements: 12.8 x 6.9 x 6.2 cm = volume: 287 mL. Mildly
increased echogenicity. Mild hydronephrosis. No mass visualized.

Bladder:

Appears normal for degree of bladder distention.

Other:

None.
IMPRESSION: 1. Mild left greater than right hydronephrosis.
2. Mildly increased bilateral renal echogenicity, consistent with
underlying medical renal disease.

## 2019-06-01 MED ORDER — IPRATROPIUM-ALBUTEROL 0.5-2.5 (3) MG/3ML IN SOLN
3.0000 mL | Freq: Three times a day (TID) | RESPIRATORY_TRACT | Status: DC
  Administered 2019-06-02 (×2): 3 mL via RESPIRATORY_TRACT
  Filled 2019-06-01 (×2): qty 3

## 2019-06-01 MED ORDER — HYDROCODONE-ACETAMINOPHEN 5-325 MG PO TABS
1.0000 | ORAL_TABLET | ORAL | Status: DC | PRN
  Administered 2019-06-01 – 2019-06-03 (×3): 2 via ORAL
  Administered 2019-06-04: 1 via ORAL
  Administered 2019-06-04 – 2019-06-05 (×2): 2 via ORAL
  Filled 2019-06-01 (×5): qty 2
  Filled 2019-06-01: qty 1

## 2019-06-01 MED ORDER — LIDOCAINE HCL URETHRAL/MUCOSAL 2 % EX GEL
1.0000 "application " | Freq: Once | CUTANEOUS | Status: AC
  Filled 2019-06-01: qty 20

## 2019-06-01 MED ORDER — MIDODRINE HCL 5 MG PO TABS
5.0000 mg | ORAL_TABLET | Freq: Three times a day (TID) | ORAL | Status: DC
  Administered 2019-06-01 – 2019-06-05 (×13): 5 mg via ORAL
  Filled 2019-06-01 (×13): qty 1

## 2019-06-01 MED ORDER — LINEZOLID 600 MG/300ML IV SOLN
600.0000 mg | Freq: Two times a day (BID) | INTRAVENOUS | Status: DC
  Administered 2019-06-01 – 2019-06-02 (×3): 600 mg via INTRAVENOUS
  Filled 2019-06-01 (×4): qty 300

## 2019-06-01 NOTE — Progress Notes (Signed)
[facility]PHROLOGY PROGRESS NOTE  Assessment/ Plan: Pt is a 68 y.o. yo male male with CHF, A. fib, DM, CKD with baseline creatinine around 2 admitted to [facility] from 9/14-9/19, readmitted on 10/6 status post cardiac arrest and suffered AKI on CKD required CRRT till 10/29.  #AKI on CKD, baseline creatinine around 2.  AKI likely ATN after cardiac arrest and concomitant with COVID-19 infection.  Initially treated with Lasix and then started CRRT from 10/24-10/29. -Urine output is marginal, doubt there is strict ins and out.  Started gentle IV hydration yesterday because of intravascular depletion.  Creatinine level trended down to 2.67.  His breathing remains a stable.  No plan for dialysis today.  Keep dialysis catheter.  #Acute hypoxic respiratory failure/pneumonia due to COVID-19 virus: Completed course of remdesivir and steroid.  Clinically improving.  In room air.  #Hypotension/possible adrenal insufficiency: On Solu-Cortef tapering and midodrine.  Lower midodrine dose to 5 mg 3 times daily.  #ESBL UTI: Meropenem since 11/1.  Urology following for some blood clot during urination.  #Acute on chronic systolic CHF: LVEF 40 to 43%.  Subjective:  Face-to-face physical examination was not performed d/t COVID-19 pandemic in effort to preserve PPE and avoid infecting other providers. Detailed review of the medical record, direct communication with primary team, and thorough accounting of tests and studies has been performed. Objective Vital signs in last 24 hours: Vitals:   05/31/19 2202 06/01/19 0500 06/01/19 0735 06/01/19 0905  BP: (!) 105/55   (!) 148/91  Pulse: 82   (!) 104  Resp: 20  20 (!) 24  Temp: 97.6 F (36.4 C)   98.3 F (36.8 C)  TempSrc: Oral   Oral  SpO2: 97%   94%  Weight:  87.7 kg    Height:       Weight change: 0.408 kg  Intake/Output Summary (Last 24 hours) at 06/01/2019 1020 Last data filed at 05/31/2019 1639 Gross per 24 hour   Intake -  Output 320 ml  Net -320 ml       Labs: Basic Metabolic Panel: Recent Labs  Lab 05/27/19 0615 05/27/19 1605 05/28/19 0504  05/30/19 1112 05/31/19 0534 06/01/19 0637  NA 139 135 136   < > 137 140 140  K 5.4* 4.1 3.8   < > 3.8 3.6 3.5  CL 104 105 102   < > 105 107 105  CO2 26 23 27    < > 20* 22 21*  GLUCOSE 158* 215* 150*   < > 195* 150* 128*  BUN 21 19 20    < > 52* 58* 61*  CREATININE 1.33* 1.22 1.26*   < > 2.67* 2.70* 2.67*  CALCIUM 8.1* 7.6* 8.1*   < > 8.5* 8.4* 8.2*  PHOS 3.3 2.2* 2.4*  2.4*  --   --   --   --    < > = values in this interval not displayed.   Liver Function Tests: Recent Labs  Lab 05/27/19 0400 05/27/19 0615 05/27/19 1605 05/28/19 0504  AST 49*  --   --   --   ALT 78*  --   --   --   ALKPHOS 320*  --   --   --   BILITOT 1.3*  --   --   --   PROT 4.7*  --   --   --   ALBUMIN 2.1* 2.3* 2.2* 2.4*   No results for input(s): LIPASE, AMYLASE in the last 168 hours. No results  for input(s): AMMONIA in the last 168 hours. CBC: Recent Labs  Lab 05/28/19 0504 05/29/19 0605 05/30/19 1112 05/31/19 0534 06/01/19 0637  WBC 12.5* 10.7* 11.4* 9.2 10.4  HGB 10.5* 10.2* 10.8* 10.3* 11.7*  HCT 35.1* 32.8* 34.7* 33.7* 38.5*  MCV 102.3* 101.5* 98.3 99.7 100.0  PLT 103* 108* 122* 109* 126*   Cardiac Enzymes: No results for input(s): CKTOTAL, CKMB, CKMBINDEX, TROPONINI in the last 168 hours. CBG: Recent Labs  Lab 05/31/19 0820 05/31/19 1155 05/31/19 1613 05/31/19 2057 06/01/19 0849  GLUCAP 150* 136* 211* 175* 86    Iron Studies: No results for input(s): IRON, TIBC, TRANSFERRIN, FERRITIN in the last 72 hours. Studies/Results: No results found.  Medications: Infusions: . sodium chloride 65 mL/hr at 06/01/19 0905  . meropenem (MERREM) IV 1 g (05/31/19 2107)    Scheduled Medications: . amiodarone  200 mg Oral BID  . calcitRIOL  0.25 mcg Oral Daily  . Chlorhexidine Gluconate Cloth  6 each Topical Daily  . diltiazem  30 mg Oral  Q6H  . feeding supplement (NEPRO CARB STEADY)  237 mL Oral BID BM  . hydrocortisone sodium succinate  50 mg Intravenous Q8H  . insulin aspart  0-5 Units Subcutaneous QHS  . insulin aspart  0-9 Units Subcutaneous TID WC  . insulin glargine  12 Units Subcutaneous QHS  . Ipratropium-Albuterol  1 puff Inhalation Q6H  . levETIRAcetam  500 mg Oral BID  . Melatonin  6 mg Oral QHS  . midodrine  10 mg Oral Q8H  . mometasone-formoterol  2 puff Inhalation BID  . pantoprazole  40 mg Oral Daily    have reviewed scheduled and prn medications.  Dron Prasad Bhandari 06/01/2019,10:20 AM  LOS: 26 days  Pager: [phone]

## 2019-06-01 NOTE — Progress Notes (Signed)
Physical Therapy Treatment Patient Details Name: Dylan Goodman MRN: [id number] DOB: 1951/05/16 Today's Date: 06/01/2019    History of Present Illness 68 y/o male treated for COVID pneumonia on September 13 returned to the ED on 10/7 with seizure and cardiac arrest.  Transitioned to floor where he had AKI which resolved but then later in his admission developed worsening respiratory failure and renal failure again.  Ultimately re-intubated on 10/21 and sent back to [facility]. Extubated 10/24. Off CRRT. Now back to [facility] with UTI and hematuria and distended abdomen with pain    PT Comments    Pt is very excited to be out of COVID isolation and is looking forward to seeing his wife tomorrow. Pt eager to work with therapy but was unable to progress his mobility due to increased abdominal pain, due to UTI and difficulty/pain with urination. Pt requires min A for getting to EoB where he was able to sit for about 7 minutes before requiring modAx2 to return to bed. RN notified of increase in pain and administered pain medication at end of session. Despite being limited by pain today pt continues to be an excellent candidate for CIR given his motivation to return to PLOF and family support.    Follow Up Recommendations  CIR     Equipment Recommendations  Other (comment)(TBD at next venue)    Recommendations for Other Services Rehab consult     Precautions / Restrictions Precautions Precautions: Fall Precaution Comments: abdomen pain from UTI Restrictions Weight Bearing Restrictions: No    Mobility  Bed Mobility Overal bed mobility: Needs Assistance Bed Mobility: Sidelying to Sit;Sit to Sidelying   Sidelying to sit: Min assist     Sit to sidelying: Mod assist;+2 for physical assistance;+2 for safety/equipment General bed mobility comments: initially with min A to sit EOB from sidelying, support needed at trunk. Pt limited from abdomen pain this date that was very sharp and sudden,  requiring mod A +2 for assist back to bed at trunk and BLEs     Balance Overall balance assessment: Needs assistance Sitting-balance support: Feet supported;Bilateral upper extremity supported Sitting balance-Leahy Scale: Fair Sitting balance - Comments: heavy use of BUEs 2/2 pain                                    Cognition Arousal/Alertness: Awake/alert Behavior During Therapy: WFL for tasks assessed/performed Overall Cognitive Status: Within Functional Limits for tasks assessed                                           General Comments General comments (skin integrity, edema, etc.): Pt limited by increased abdominal pain from UTI      Pertinent Vitals/Pain Pain Assessment: Faces Faces Pain Scale: Hurts whole lot Pain Location: abdomen from UTI, burning with urination; back at start of session that improved with repositioning Pain Descriptors / Indicators: Discomfort;Grimacing;Moaning;Sharp Pain Intervention(s): Limited activity within patient's tolerance;Monitored during session;Repositioned;Patient requesting pain meds-RN notified;RN gave pain meds during session           PT Goals (current goals can now be found in the care plan section) Acute Rehab PT Goals Patient Stated Goal: to see wife tomorrow PT Goal Formulation: With patient Time For Goal Achievement: 06/08/19 Potential to Achieve Goals: Fair Progress towards PT goals: Not progressing toward  goals - comment(limited by pain)    Frequency    Min 4X/week      PT Plan Current plan remains appropriate;Frequency needs to be updated    Co-evaluation   Reason for Co-Treatment: For patient/therapist safety;To address functional/ADL transfers PT goals addressed during session: Mobility/safety with mobility OT goals addressed during session: ADL's and self-care      AM-PAC PT "6 Clicks" Mobility   Outcome Measure  Help needed turning from your back to your side while in a  flat bed without using bedrails?: A Little Help needed moving from lying on your back to sitting on the side of a flat bed without using bedrails?: A Little Help needed moving to and from a bed to a chair (including a wheelchair)?: Total Help needed standing up from a chair using your arms (e.g., wheelchair or bedside chair)?: Total Help needed to walk in hospital room?: Total Help needed climbing 3-5 steps with a railing? : Total 6 Click Score: 10    End of Session   Activity Tolerance: Patient limited by pain Patient left: in bed;with call bell/phone within reach;with bed alarm set Nurse Communication: Mobility status PT Visit Diagnosis: Unsteadiness on feet (R26.81);Other abnormalities of gait and mobility (R26.89);Muscle weakness (generalized) (M62.81);Difficulty in walking, not elsewhere classified (R26.2);Pain Pain - part of body: (abdomen)     Time: 2080-2233 PT Time Calculation (min) (ACUTE ONLY): 28 min  Charges:  $Therapeutic Activity: 8-22 mins                     Elizardo Chilson B. Migdalia Dk PT, DPT Acute Rehabilitation Services Pager [phone] Office [phone]    [place] 06/01/2019, 5:03 PM

## 2019-06-01 NOTE — Progress Notes (Signed)
Patients test result is negative for Covid. Pt to transfer from Covid unit.

## 2019-06-01 NOTE — Progress Notes (Signed)
PROGRESS NOTE  Dylan Goodman CZY:[id number] DOB: 09/28/1950 DOA: 05/06/2019 PCP: Steele Sizer, MD  HPI/Recap of past 79 hours: 68 year old with a history of atrial fibrillation, GIB, CAD status post CABG, nasopharyngeal cancer, CKD, DM, and GERD who was previously treated for Covid pneumonia at [facility] and discharged on 9/19.  On 10/6, returned to the [facility] ED with reported seizure-like behavior, and subsequently suffered a cardiac arrest. He was intubated and transferred to [facility] ICU.  Patient was extubated and subsequently intubated again on 10/21 and transferred to [facility], extubated on 10/24.  Patient was [facility] to [facility] for possible intermittent hemodialysis and CIR evaluation    Today, patient reported significant dysuria, with noted some little blood clots in urinal.  Patient also reports a sensation of bladder fullness, but unable to empty.  Denies any worsening shortness of breath, fever/chills, chest pain, worsening cough.  Assessment/Plan: Principal Problem:   Acute respiratory failure with hypoxemia (HCC) Active Problems:   GERD (gastroesophageal reflux disease)   Hypomagnesemia   Acute kidney injury superimposed on chronic kidney disease (HCC)   Atrial fibrillation with RVR ([place])   COVID-19 virus infection   Respiratory failure, acute (HCC)   Cardiac arrest (HCC)   Prolonged QT interval   Seizure (HCC)   Acute respiratory disease due to COVID-19 virus   Hypoxia   Pressure injury of skin   Acute pulmonary edema (HCC)  Acute hypoxic respiratory failure/pneumonia due to COVID-19 virus Currently afebrile, with no leukocytosis Patient has 2+ Covid test on 04/12/2019, and 05/04/2019, repeat test done on 05/30/2019 is negative Currently saturating above 90% on room air Patient has completed course of remdesivir and steroids, however on stress dose steroids due to concern for adrenal insufficiency Continue Solu-Cortef, continue weaning  off slowly Continue inhalers Transfer out of isolation  Hypotension/possible adrenal insufficiency BP stable Unclear etiology for hypotension, ??  Worsened by CRRT Currently off pressors Continue midodrine at a lower dose, continue Solu-Cortef with slow taper  Chronic A. fib with RVR Asymptomatic Heart rate somewhat controlled Echo done on 10/9 showed EF of 45 to 50%, improved from previous on 03/27/2019 which was 35 to 40% Patient had been followed by cardiology, but currently signed off May consult cardiology if anything acute changes from a cardiology standpoint Continue Cardizem, amiodarone, hold off on [facility] with heparin drip due to significant hematuria  ESBL/VRE UTI/possible acute urinary retention Patient with significant hematuria, dysuria, bladder distention Currently afebrile with leukocytosis UA showed large hemoglobin, many bacteria, 6-10 WBC UC grew ESBL Klebsiella, VRE Ultrasound done on 06/01/2019 showed mild left greater than right hydronephrosis, medical renal disease Was on ceftriaxone empirically, will switch to meropenem, add linezolid Discussed with urology Dr. Claudia Desanctis on 06/01/2019, recommend continuing treatment for UTI, placing Foley catheter, no further interventions right now due to UTI We will continue to hold off heparin drip as patient still has persistent hematuria  Diffuse ST depression noted on EKG Asymptomatic As stated, may consult cardiology if anything acute changes, otherwise can be followed up as an outpatient  Acute on chronic systolic HF Appears euvolemic Echo showed EF of 45 to 50% on 10/9 Volume was managed by CRRT, plans to start intermittent HD  AKI Was on CRRT, nephrology on board Plan for intermittent HD on hold for now as creatinine is stable Started on gentle IV hydration due to possible intravascular volume depletion Daily BMP  History of carotid artery disease/peripheral vascular disease Was on Pletal, Brilinta as well  as Eliquis  Currently all on hold due to increased risk of bleeding We will plan to reinitiate once okay by cardiology/urology  Single episode of seizure No further episodes Continue Keppra  Diabetes mellitus type 2 Last A1c 6.7 SSI, Lantus, Accu-Cheks, hypoglycemic protocol  GERD Continue PPI  Deconditioning PT/OT CIR recommended         Malnutrition Type:  Nutrition Problem: Increased nutrient needs Etiology: acute illness   Malnutrition Characteristics:  Signs/Symptoms: estimated needs   Nutrition Interventions:  Interventions: Refer to RD note for recommendations    Estimated body mass index is 26.97 kg/m as calculated from the following:   Height as of this encounter: 5\' 11"  (1.803 m).   Weight as of this encounter: 87.7 kg.     Code Status: Full  Family Communication: None at bedside  Disposition Plan: CIR   Consultants:  PCCM  Nephrology  Urology  Procedures:  Mechanical ventilation as mentioned above  Antimicrobials:  Started meropenem on 11/1  Started linezolid on 11/2  DVT prophylaxis: SCD   Objective: Vitals:   05/31/19 2202 06/01/19 0500 06/01/19 0735 06/01/19 0905  BP: (!) 105/55   (!) 148/91  Pulse: 82   (!) 104  Resp: 20  20 (!) 24  Temp: 97.6 F (36.4 C)   98.3 F (36.8 C)  TempSrc: Oral   Oral  SpO2: 97%   94%  Weight:  87.7 kg    Height:        Intake/Output Summary (Last 24 hours) at 06/01/2019 1431 Last data filed at 05/31/2019 1639 Gross per 24 hour  Intake -  Output 50 ml  Net -50 ml   Filed Weights   05/29/19 0451 05/31/19 0500 06/01/19 0500  Weight: 85.3 kg 87.3 kg 87.7 kg    Exam:  General:  Mild distress due to significant dysuria, chronically ill-appearing  Cardiovascular: S1, S2 present  Respiratory:  Diminished breath sounds bilaterally  Abdomen: Soft, nontender, nondistended, bowel sounds present  Musculoskeletal: No bilateral pedal edema noted  Skin: Normal  Psychiatry: Normal mood    Data Reviewed: CBC: Recent Labs  Lab 05/28/19 0504 05/29/19 0605 05/30/19 1112 05/31/19 0534 06/01/19 0637  WBC 12.5* 10.7* 11.4* 9.2 10.4  HGB 10.5* 10.2* 10.8* 10.3* 11.7*  HCT 35.1* 32.8* 34.7* 33.7* 38.5*  MCV 102.3* 101.5* 98.3 99.7 100.0  PLT 103* 108* 122* 109* 741*   Basic Metabolic Panel: Recent Labs  Lab 05/26/19 0010 05/26/19 1530 05/27/19 0615 05/27/19 1605 05/28/19 0504 05/29/19 0605 05/30/19 1112 05/31/19 0534 06/01/19 0637  NA 135 137 139 135 136 138 137 140 140  K 4.1 4.4 5.4* 4.1 3.8 3.8 3.8 3.6 3.5  CL 103 105 104 105 102 105 105 107 105  CO2 24 27 26 23 27 24  20* 22 21*  GLUCOSE 94 108* 158* 215* 150* 175* 195* 150* 128*  BUN 33* 22 21 19 20  38* 52* 58* 61*  CREATININE 1.62* 1.20 1.33* 1.22 1.26* 2.04* 2.67* 2.70* 2.67*  CALCIUM 8.2* 7.7* 8.1* 7.6* 8.1* 8.5* 8.5* 8.4* 8.2*  MG 2.2  --  2.1  --  2.1  --   --   --   --   PHOS 2.0* 2.9 3.3 2.2* 2.4*  2.4*  --   --   --   --    GFR: Estimated Creatinine Clearance: 29.4 mL/min (A) (by C-G formula based on SCr of 2.67 mg/dL (H)). Liver Function Tests: Recent Labs  Lab 05/26/19 1530 05/27/19 0400 05/27/19 0615 05/27/19 1605  05/28/19 0504  AST  --  49*  --   --   --   ALT  --  78*  --   --   --   ALKPHOS  --  320*  --   --   --   BILITOT  --  1.3*  --   --   --   PROT  --  4.7*  --   --   --   ALBUMIN 2.0* 2.1* 2.3* 2.2* 2.4*   No results for input(s): LIPASE, AMYLASE in the last 168 hours. No results for input(s): AMMONIA in the last 168 hours. Coagulation Profile: No results for input(s): INR, PROTIME in the last 168 hours. Cardiac Enzymes: No results for input(s): CKTOTAL, CKMB, CKMBINDEX, TROPONINI in the last 168 hours. BNP (last 3 results) No results for input(s): PROBNP in the last 8760 hours. HbA1C: No results for input(s): HGBA1C in the last 72 hours. CBG: Recent Labs  Lab 05/31/19 1155 05/31/19 1613 05/31/19 2057 06/01/19 0849 06/01/19 1256  GLUCAP 136* 211* 175* 86 79    Lipid Profile: No results for input(s): CHOL, HDL, LDLCALC, TRIG, CHOLHDL, LDLDIRECT in the last 72 hours. Thyroid Function Tests: No results for input(s): TSH, T4TOTAL, FREET4, T3FREE, THYROIDAB in the last 72 hours. Anemia Panel: No results for input(s): VITAMINB12, FOLATE, FERRITIN, TIBC, IRON, RETICCTPCT in the last 72 hours. Urine analysis:    Component Value Date/Time   COLORURINE YELLOW 05/29/2019 1215   APPEARANCEUR CLEAR 05/29/2019 1215   LABSPEC 1.005 05/29/2019 1215   PHURINE 5.0 05/29/2019 1215   GLUCOSEU NEGATIVE 05/29/2019 1215   HGBUR LARGE (A) 05/29/2019 1215   [place] 05/29/2019 1215   [place] 05/29/2019 1215   PROTEINUR NEGATIVE 05/29/2019 1215   NITRITE NEGATIVE 05/29/2019 1215   LEUKOCYTESUR TRACE (A) 05/29/2019 1215   Sepsis Labs: @LABRCNTIP (procalcitonin:4,lacticidven:4)  ) Recent Results (from the past 240 hour(s))  Culture, Urine     Status: Abnormal   Collection Time: 05/29/19  3:14 PM   Specimen: Urine, Random  Result Value Ref Range Status   Specimen Description   Final    URINE, RANDOM Performed at [facility], [place] [address], [place], [place] [address]    Special Requests   Final    NONE Performed at [facility], [place] [address], [place],  [address]    Culture (A)  Final    >=100,000 COLONIES/mL KLEBSIELLA PNEUMONIAE Confirmed Extended Spectrum Beta-Lactamase Producer (ESBL).  In bloodstream infections from ESBL organisms, carbapenems are preferred over piperacillin/tazobactam. They are shown to have a lower risk of mortality. >=100,000 COLONIES/mL VANCOMYCIN RESISTANT ENTEROCOCCUS ISOLATED    Report Status 06/01/2019 FINAL  Final   Organism ID, Bacteria KLEBSIELLA PNEUMONIAE (A)  Final   Organism ID, Bacteria VANCOMYCIN RESISTANT ENTEROCOCCUS ISOLATED (A)  Final      Susceptibility   Klebsiella pneumoniae - MIC*    AMPICILLIN >=32 RESISTANT Resistant      CEFAZOLIN >=64 RESISTANT Resistant     CEFTRIAXONE >=64 RESISTANT Resistant     CIPROFLOXACIN <=0.25 SENSITIVE Sensitive     GENTAMICIN <=1 SENSITIVE Sensitive     IMIPENEM <=0.25 SENSITIVE Sensitive     NITROFURANTOIN 64 INTERMEDIATE Intermediate     TRIMETH/SULFA >=320 RESISTANT Resistant     AMPICILLIN/SULBACTAM >=32 RESISTANT Resistant     PIP/TAZO 8 SENSITIVE Sensitive     Extended ESBL POSITIVE Resistant     * >=100,000 COLONIES/mL KLEBSIELLA PNEUMONIAE   Vancomycin resistant enterococcus isolated -  MIC*    AMPICILLIN >=32 RESISTANT Resistant     LEVOFLOXACIN >=8 RESISTANT Resistant     NITROFURANTOIN 64 INTERMEDIATE Intermediate     VANCOMYCIN >=32 RESISTANT Resistant     LINEZOLID 2 SENSITIVE Sensitive     * >=100,000 COLONIES/mL VANCOMYCIN RESISTANT ENTEROCOCCUS ISOLATED  Novel Coronavirus, NAA (Hosp order, Send-out to Ref Lab; TAT 18-24 hrs     Status: None   Collection Time: 05/30/19  6:41 AM   Specimen: Nasopharyngeal Swab; Respiratory  Result Value Ref Range Status   SARS-CoV-2, NAA NOT DETECTED NOT DETECTED Final    Comment: (NOTE) This nucleic acid amplification test was developed and its performance characteristics determined by [employer]. Nucleic acid amplification tests include PCR and TMA. This test has not been FDA cleared or approved. This test has been authorized by FDA under an Emergency Use Authorization (EUA). This test is only authorized for the duration of time the declaration that circumstances exist justifying the authorization of the emergency use of in vitro diagnostic tests for detection of SARS-CoV-2 virus and/or diagnosis of COVID-19 infection under section 564(b)(1) of the Act, 21 U.S.C. 160VPX-1(G) (1), unless the authorization is terminated or revoked sooner. When diagnostic testing is negative, the possibility of a false negative result should be considered in the context of a patient's recent exposures and the presence of clinical  signs and symptoms consistent with COVID-19. An individual without symptoms of COVID- 19 and who is not shedding SARS-CoV-2 vi rus would expect to have a negative (not detected) result in this assay. Performed At: [facility] [address] [place], [place] [id number] Rush Farmer MD EV:[id number]    [place]  Final    Comment: Performed at [facility], [place] [address] [place], [place] [address]      Studies: US Renal  Result Date: 06/01/2019 CLINICAL DATA:  Hematuria. EXAM: RENAL / URINARY TRACT ULTRASOUND COMPLETE COMPARISON:  Renal ultrasound dated April 13, 2019. FINDINGS: Right Kidney: Renal measurements: 11.8 x 6.8 x 5.8 cm = volume: 243 mL. Mildly increased echogenicity. Mild hydronephrosis. No mass visualized. Left Kidney: Renal measurements: 12.8 x 6.9 x 6.2 cm = volume: 287 mL. Mildly increased echogenicity. Mild hydronephrosis. No mass visualized. Bladder: Appears normal for degree of bladder distention. Other: None. IMPRESSION: 1. Mild left greater than right hydronephrosis. 2. Mildly increased bilateral renal echogenicity, consistent with underlying medical renal disease. Electronically Signed   By: Titus Dubin M.D.   On: 06/01/2019 11:52    Scheduled Meds: . amiodarone  200 mg Oral BID  . calcitRIOL  0.25 mcg Oral Daily  . Chlorhexidine Gluconate Cloth  6 each Topical Daily  . diltiazem  30 mg Oral Q6H  . feeding supplement (NEPRO CARB STEADY)  237 mL Oral BID BM  . hydrocortisone sodium succinate  50 mg Intravenous Q8H  . insulin aspart  0-5 Units Subcutaneous QHS  . insulin aspart  0-9 Units Subcutaneous TID WC  . insulin glargine  12 Units Subcutaneous QHS  . Ipratropium-Albuterol  1 puff Inhalation Q6H  . levETIRAcetam  500 mg Oral BID  . Melatonin  6 mg Oral QHS  . midodrine  5 mg Oral TID WC  . mometasone-formoterol  2 puff Inhalation BID  . pantoprazole  40 mg Oral Daily    Continuous Infusions: . sodium  chloride 65 mL/hr at 06/01/19 0905  . linezolid (ZYVOX) IV    . meropenem (MERREM) IV 1 g (06/01/19 1157)     LOS: 26  days     Alma Friendly, MD [facility]  If 7PM-7AM, please contact night-coverage [email or web address] 06/01/2019, 2:31 PM

## 2019-06-01 NOTE — Progress Notes (Signed)
Inpatient Rehab Admissions:  Inpatient Rehab Consult received. Noted there are plans to transfer out of isolation with last covid test done 10/31 and negative. Will meet with patient once isolation has been discontinued to begin discussion and assessment regarding CIR and rehab options.   Will follow up tomorrow.   Jhonnie Garner, OTR/L  Rehab Admissions Coordinator  [phone] 06/01/2019 3:55 PM

## 2019-06-01 NOTE — Progress Notes (Signed)
Occupational Therapy Treatment Patient Details Name: Dylan Goodman MRN: [id number] DOB: 02/10/1951 Today's Date: 06/01/2019    History of present illness 68 y/o male treated for COVID pneumonia on September 13 returned to the ED on 10/7 with seizure and cardiac arrest.  Transitioned to floor where he had AKI which resolved but then later in his admission developed worsening respiratory failure and renal failure again.  Ultimately re-intubated on 10/21 and sent back to [facility]. Extubated 10/24. Off CRRT. Now back to [facility] with UTI and hematuria and distended abdomen with pain   OT comments  Pt seen for OT follow up, limited by UTI pain this date. He remains eager and focused on therapy. Pt completed bed mobility at min A, able to sit ~3 minutes. Once sitting EOB, pt c/o of sharp intense pain abdomen with strong urge to urinate. Pt assisted back to bed with mod A +2. Once able to urinate pt crying out in pain, RN delivered meds. He continues to remain an excellent CIR candidate, especially as UTI symptoms resolve. Will continue to follow.    Follow Up Recommendations  CIR    Equipment Recommendations  None recommended by OT    Recommendations for Other Services      Precautions / Restrictions Precautions Precautions: Fall Precaution Comments: abdomen pain from UTI Restrictions Weight Bearing Restrictions: No       Mobility Bed Mobility Overal bed mobility: Needs Assistance Bed Mobility: Sidelying to Sit;Sit to Sidelying   Sidelying to sit: Min assist     Sit to sidelying: Mod assist;+2 for physical assistance;+2 for safety/equipment General bed mobility comments: initially with min A to sit EOB from sidelying, support needed at trunk. Pt limited from abdomen pain this date that was very sharp and sudden, requiring mod A +2 for assist back to bed at trunk and BLEs  Transfers                      Balance Overall balance assessment: Needs assistance Sitting-balance  support: Feet supported;Bilateral upper extremity supported Sitting balance-Leahy Scale: Fair Sitting balance - Comments: heavy use of BUEs 2/2 pain                                   ADL either performed or assessed with clinical judgement   ADL Overall ADL's : Needs assistance/impaired                                     Functional mobility during ADLs: Minimal assistance(sitting EOB) General ADL Comments: session limited from abdomen pain 2/2 UTI symptoms     Vision Baseline Vision/History: No visual deficits Patient Visual Report: No change from baseline     Perception     Praxis      Cognition Arousal/Alertness: Awake/alert Behavior During Therapy: WFL for tasks assessed/performed Overall Cognitive Status: Within Functional Limits for tasks assessed                                          Exercises     Shoulder Instructions       General Comments      Pertinent Vitals/ Pain       Pain Assessment: Faces Faces Pain Scale: Hurts whole  lot Pain Location: abdomen from UTI, burning with urination; back at start of session that improved with repositioning Pain Descriptors / Indicators: Discomfort;Grimacing;Moaning;Sharp Pain Intervention(s): Limited activity within patient's tolerance;Repositioned;Monitored during session  Home Living                                          Prior Functioning/Environment              Frequency  Min 2X/week        Progress Toward Goals  OT Goals(current goals can now be found in the care plan section)  Progress towards OT goals: Progressing toward goals  Acute Rehab OT Goals Patient Stated Goal: to see wife tomorrow OT Goal Formulation: With patient Time For Goal Achievement: 06/08/19 Potential to Achieve Goals: Good  Plan Discharge plan remains appropriate;Frequency remains appropriate    Co-evaluation    PT/OT/SLP Co-Evaluation/Treatment:  Yes Reason for Co-Treatment: For patient/therapist safety;To address functional/ADL transfers PT goals addressed during session: Mobility/safety with mobility OT goals addressed during session: ADL's and self-care      AM-PAC OT "6 Clicks" Daily Activity     Outcome Measure   Help from another person eating meals?: A Little Help from another person taking care of personal grooming?: A Little Help from another person toileting, which includes using toliet, bedpan, or urinal?: A Lot Help from another person bathing (including washing, rinsing, drying)?: A Lot Help from another person to put on and taking off regular upper body clothing?: A Little Help from another person to put on and taking off regular lower body clothing?: A Lot 6 Click Score: 15    End of Session    OT Visit Diagnosis: Unsteadiness on feet (R26.81);Muscle weakness (generalized) (M62.81);Pain Pain - part of body: (abdomen)   Activity Tolerance Patient tolerated treatment well   Patient Left in bed;with call bell/phone within reach;with bed alarm set   Nurse Communication Mobility status;Patient requests pain meds        Time: 0263-7858 OT Time Calculation (min): 28 min  Charges: OT General Charges $OT Visit: 1 Visit OT Treatments $Self Care/Home Management : 8-22 mins  Zenovia Jarred, MSOT, OTR/L Behavioral Health OT/ Acute Relief OT [facility] Office: [phone]    Zenovia Jarred 06/01/2019, 4:51 PM

## 2019-06-02 LAB — CBC
HCT: 33.6 % — ABNORMAL LOW (ref 39.0–52.0)
Hemoglobin: 10.2 g/dL — ABNORMAL LOW (ref 13.0–17.0)
MCH: 30.5 pg (ref 26.0–34.0)
MCHC: 30.4 g/dL (ref 30.0–36.0)
MCV: 100.6 fL — ABNORMAL HIGH (ref 80.0–100.0)
Platelets: 96 10*3/uL — ABNORMAL LOW (ref 150–400)
RBC: 3.34 MIL/uL — ABNORMAL LOW (ref 4.22–5.81)
RDW: 19.9 % — ABNORMAL HIGH (ref 11.5–15.5)
WBC: 7.8 10*3/uL (ref 4.0–10.5)
nRBC: 0 % (ref 0.0–0.2)

## 2019-06-02 LAB — BASIC METABOLIC PANEL
Anion gap: 14 (ref 5–15)
BUN: 57 mg/dL — ABNORMAL HIGH (ref 8–23)
CO2: 21 mmol/L — ABNORMAL LOW (ref 22–32)
Calcium: 7.6 mg/dL — ABNORMAL LOW (ref 8.9–10.3)
Chloride: 105 mmol/L (ref 98–111)
Creatinine, Ser: 2.65 mg/dL — ABNORMAL HIGH (ref 0.61–1.24)
GFR calc Af Amer: 28 mL/min — ABNORMAL LOW (ref >60)
GFR calc non Af Amer: 24 mL/min — ABNORMAL LOW (ref >60)
Glucose, Bld: 169 mg/dL — ABNORMAL HIGH (ref 70–99)
Potassium: 3.8 mmol/L (ref 3.5–5.1)
Sodium: 140 mmol/L (ref 135–145)

## 2019-06-02 LAB — GLUCOSE, CAPILLARY
Glucose-Capillary: 139 mg/dL — ABNORMAL HIGH (ref 70–99)
Glucose-Capillary: 217 mg/dL — ABNORMAL HIGH (ref 70–99)
Glucose-Capillary: 153 mg/dL — ABNORMAL HIGH (ref 70–99)
Glucose-Capillary: 194 mg/dL — ABNORMAL HIGH (ref 70–99)

## 2019-06-02 MED ORDER — HEPARIN (PORCINE) 25000 UT/250ML-% IV SOLN
1450.0000 [IU]/h | INTRAVENOUS | Status: DC
  Administered 2019-06-02 – 2019-06-03 (×3): 1700 [IU]/h via INTRAVENOUS
  Filled 2019-06-02 (×3): qty 250

## 2019-06-02 MED ORDER — PRO-STAT SUGAR FREE PO LIQD
30.0000 mL | Freq: Two times a day (BID) | ORAL | Status: DC
  Administered 2019-06-02 – 2019-06-05 (×7): 30 mL via ORAL
  Filled 2019-06-02 (×7): qty 30

## 2019-06-02 MED ORDER — IPRATROPIUM-ALBUTEROL 0.5-2.5 (3) MG/3ML IN SOLN
3.0000 mL | Freq: Two times a day (BID) | RESPIRATORY_TRACT | Status: DC
  Administered 2019-06-02 – 2019-06-05 (×6): 3 mL via RESPIRATORY_TRACT
  Filled 2019-06-02 (×6): qty 3

## 2019-06-02 MED ORDER — LINEZOLID 600 MG PO TABS
600.0000 mg | ORAL_TABLET | Freq: Two times a day (BID) | ORAL | Status: DC
  Administered 2019-06-02 – 2019-06-05 (×6): 600 mg via ORAL
  Filled 2019-06-02 (×7): qty 1

## 2019-06-02 MED ORDER — HYDROCORTISONE NA SUCCINATE PF 100 MG IJ SOLR
50.0000 mg | Freq: Two times a day (BID) | INTRAMUSCULAR | Status: DC
  Administered 2019-06-03 – 2019-06-05 (×5): 50 mg via INTRAVENOUS
  Filled 2019-06-02 (×5): qty 2

## 2019-06-02 NOTE — Progress Notes (Signed)
PROGRESS NOTE  Dylan Goodman RAQ:[id number] DOB: 06-Aug-1950 DOA: 05/06/2019 PCP: Steele Sizer, MD  HPI/Recap of past 43 hours: 68 year old with a history of atrial fibrillation, GIB, CAD status post CABG, nasopharyngeal cancer, CKD, DM, and GERD who was previously treated for Covid pneumonia at [facility] and discharged on 9/19.  On 10/6, returned to the [facility] ED with reported seizure-like behavior, and subsequently suffered a cardiac arrest. He was intubated and transferred to [facility] ICU.  Patient was extubated and subsequently intubated again on 10/21 and transferred to [facility], extubated on 10/24.  [facility] was [facility] for possible intermittent hemodialysis and CIR evaluation   Today, patient reports feeling much better after Foley insertion, denies any shortness of breath, cough, fever/chills, abdominal pain, chest pain, nausea/vomiting.  Excited to see his wife today after such a long time.   Assessment/Plan: Principal Problem:   Acute respiratory failure with hypoxemia (HCC) Active Problems:   GERD (gastroesophageal reflux disease)   Hypomagnesemia   Acute kidney injury superimposed on chronic kidney disease (HCC)   Atrial fibrillation with RVR ([place])   COVID-19 virus infection   Respiratory failure, acute (HCC)   Cardiac arrest (HCC)   Prolonged QT interval   Seizure (HCC)   Acute respiratory disease due to COVID-19 virus   Hypoxia   Pressure injury of skin   Acute pulmonary edema (HCC)  Acute hypoxic respiratory failure/pneumonia due to COVID-19 virus Currently afebrile, with no leukocytosis Patient has 2+ Covid test on 04/12/2019, and 05/04/2019, repeat test done on 05/30/2019 is negative Currently saturating above 90% on room air Patient has completed course of remdesivir and steroids, however on stress dose steroids due to concern for adrenal insufficiency Continue Solu-Cortef, continue weaning off slowly Continue inhalers  Monitor closely  Hypotension/possible adrenal insufficiency BP stable Unclear etiology for hypotension, ??  Worsened by CRRT Currently off pressors Continue midodrine at a lower dose, continue Solu-Cortef with slow taper  Chronic A. fib with RVR Asymptomatic Heart rate somewhat controlled Echo done on 10/9 showed EF of 45 to 50%, improved from previous on 03/27/2019 which was 35 to 40% Patient had been followed by cardiology, but currently signed off May consult cardiology if anything acute changes from a cardiology standpoint Continue Cardizem, amiodarone Restart AC on 06/02/19 with heparin drip, if significant hematuria develops, may hold  ESBL/VRE UTI/possible acute urinary retention Patient with significant hematuria, dysuria, bladder distention Currently afebrile with no leukocytosis UA showed large hemoglobin, many bacteria, 6-10 WBC UC grew ESBL Klebsiella, VRE Ultrasound done on 06/01/2019 showed mild left greater than right hydronephrosis, medical renal disease Was on ceftriaxone empirically, will switch to meropenem, add linezolid Discussed with urology Dr. Claudia Desanctis on 06/01/2019, recommend continuing treatment for UTI, placing Foley catheter, no further interventions right now due to UTI  Diffuse ST depression noted on EKG Asymptomatic As stated, may consult cardiology if anything acute changes, otherwise can be followed up as an outpatient  Acute on chronic systolic HF Appears euvolemic Echo showed EF of 45 to 50% on 10/9 Volume was managed by CRRT, plans to start intermittent HD  AKI Was on CRRT, nephrology on board Plan for intermittent HD on hold for now as creatinine is stable S/P gentle IV hydration due to possible intravascular volume depletion Daily BMP  History of carotid artery disease/peripheral vascular disease Was on Pletal, Brilinta as well as Eliquis Currently all on hold due to increased risk of bleeding We will plan to reinitiate once okay  by  cardiology/urology  Single episode of seizure No further episodes Continue Keppra  Diabetes mellitus type 2 Last A1c 6.7 SSI, Lantus, Accu-Cheks, hypoglycemic protocol  GERD Continue PPI  Deconditioning PT/OT CIR recommended         Malnutrition Type:  Nutrition Problem: Increased nutrient needs Etiology: acute illness   Malnutrition Characteristics:  Signs/Symptoms: estimated needs   Nutrition Interventions:  Interventions: Prostat    Estimated body mass index is 26.97 kg/m as calculated from the following:   Height as of this encounter: 5\' 11"  (1.803 m).   Weight as of this encounter: 87.7 kg.     Code Status: Full  Family Communication: None at bedside  Disposition Plan: CIR   Consultants:  PCCM  Nephrology  Urology  Procedures:  Mechanical ventilation as mentioned above  Antimicrobials:  Started meropenem on 11/1  Started linezolid on 11/2  DVT prophylaxis: SCD, heparin drip   Objective: Vitals:   06/01/19 1954 06/01/19 2356 06/02/19 0726 06/02/19 1608  BP:  (!) 103/53 129/90 102/65  Pulse:  98 (!) 101 (!) 109  Resp:  20    Temp:  98.7 F (37.1 C) (!) 97.4 F (36.3 C) 97.8 F (36.6 C)  TempSrc:   Oral Oral  SpO2: 96% 91% 90% 100%  Weight:      Height:        Intake/Output Summary (Last 24 hours) at 06/02/2019 1612 Last data filed at 06/02/2019 1030 Gross per 24 hour  Intake 4593.98 ml  Output 3025 ml  Net 1568.98 ml   Filed Weights   05/29/19 0451 05/31/19 0500 06/01/19 0500  Weight: 85.3 kg 87.3 kg 87.7 kg    Exam:  General: NAD, chronically ill-appearing  Cardiovascular: S1, S2 present  Respiratory:  Diminished breath sounds bilaterally  Abdomen: Soft, nontender, nondistended, bowel sounds present  Musculoskeletal: No bilateral pedal edema noted  Skin: Normal  Psychiatry: Normal mood   Data Reviewed: CBC: Recent Labs  Lab 05/29/19 0605 05/30/19 1112 05/31/19 0534 06/01/19 0637 06/02/19  0406  WBC 10.7* 11.4* 9.2 10.4 7.8  HGB 10.2* 10.8* 10.3* 11.7* 10.2*  HCT 32.8* 34.7* 33.7* 38.5* 33.6*  MCV 101.5* 98.3 99.7 100.0 100.6*  PLT 108* 122* 109* 126* 96*   Basic Metabolic Panel: Recent Labs  Lab 05/27/19 0615 05/27/19 1605 05/28/19 0504 05/29/19 0605 05/30/19 1112 05/31/19 0534 06/01/19 0637 06/02/19 0406  NA 139 135 136 138 137 140 140 140  K 5.4* 4.1 3.8 3.8 3.8 3.6 3.5 3.8  CL 104 105 102 105 105 107 105 105  CO2 26 23 27 24  20* 22 21* 21*  GLUCOSE 158* 215* 150* 175* 195* 150* 128* 169*  BUN 21 19 20  38* 52* 58* 61* 57*  CREATININE 1.33* 1.22 1.26* 2.04* 2.67* 2.70* 2.67* 2.65*  CALCIUM 8.1* 7.6* 8.1* 8.5* 8.5* 8.4* 8.2* 7.6*  MG 2.1  --  2.1  --   --   --   --   --   PHOS 3.3 2.2* 2.4*  2.4*  --   --   --   --   --    GFR: Estimated Creatinine Clearance: 29.6 mL/min (A) (by C-G formula based on SCr of 2.65 mg/dL (H)). Liver Function Tests: Recent Labs  Lab 05/27/19 0400 05/27/19 0615 05/27/19 1605 05/28/19 0504  AST 49*  --   --   --   ALT 78*  --   --   --   ALKPHOS 320*  --   --   --  BILITOT 1.3*  --   --   --   PROT 4.7*  --   --   --   ALBUMIN 2.1* 2.3* 2.2* 2.4*   No results for input(s): LIPASE, AMYLASE in the last 168 hours. No results for input(s): AMMONIA in the last 168 hours. Coagulation Profile: No results for input(s): INR, PROTIME in the last 168 hours. Cardiac Enzymes: No results for input(s): CKTOTAL, CKMB, CKMBINDEX, TROPONINI in the last 168 hours. BNP (last 3 results) No results for input(s): PROBNP in the last 8760 hours. HbA1C: No results for input(s): HGBA1C in the last 72 hours. CBG: Recent Labs  Lab 06/01/19 1648 06/01/19 2114 06/02/19 0723 06/02/19 1136 06/02/19 1606  GLUCAP 104* 113* 139* 217* 153*   Lipid Profile: No results for input(s): CHOL, HDL, LDLCALC, TRIG, CHOLHDL, LDLDIRECT in the last 72 hours. Thyroid Function Tests: No results for input(s): TSH, T4TOTAL, FREET4, T3FREE, THYROIDAB in the  last 72 hours. Anemia Panel: No results for input(s): VITAMINB12, FOLATE, FERRITIN, TIBC, IRON, RETICCTPCT in the last 72 hours. Urine analysis:    Component Value Date/Time   COLORURINE YELLOW 05/29/2019 1215   APPEARANCEUR CLEAR 05/29/2019 1215   LABSPEC 1.005 05/29/2019 1215   PHURINE 5.0 05/29/2019 1215   GLUCOSEU NEGATIVE 05/29/2019 1215   HGBUR LARGE (A) 05/29/2019 1215   [place] 05/29/2019 1215   [place] 05/29/2019 1215   PROTEINUR NEGATIVE 05/29/2019 1215   NITRITE NEGATIVE 05/29/2019 1215   LEUKOCYTESUR TRACE (A) 05/29/2019 1215   Sepsis Labs: @LABRCNTIP (procalcitonin:4,lacticidven:4)  ) Recent Results (from the past 240 hour(s))  Culture, Urine     Status: Abnormal   Collection Time: 05/29/19  3:14 PM   Specimen: Urine, Random  Result Value Ref Range Status   Specimen Description   Final    URINE, RANDOM Performed at [facility], [place] [address] [place], [place] [address]    Special Requests   Final    NONE Performed at [facility], [place] [address] [place], [place] [address]    Culture (A)  Final    >=100,000 COLONIES/mL KLEBSIELLA PNEUMONIAE Confirmed Extended Spectrum Beta-Lactamase Producer (ESBL).  In bloodstream infections from ESBL organisms, carbapenems are preferred over piperacillin/tazobactam. They are shown to have a lower risk of mortality. >=100,000 COLONIES/mL VANCOMYCIN RESISTANT ENTEROCOCCUS ISOLATED    Report Status 06/01/2019 FINAL  Final   Organism ID, Bacteria KLEBSIELLA PNEUMONIAE (A)  Final   Organism ID, Bacteria VANCOMYCIN RESISTANT ENTEROCOCCUS ISOLATED (A)  Final      Susceptibility   Klebsiella pneumoniae - MIC*    AMPICILLIN >=32 RESISTANT Resistant     CEFAZOLIN >=64 RESISTANT Resistant     CEFTRIAXONE >=64 RESISTANT Resistant     CIPROFLOXACIN <=0.25 SENSITIVE Sensitive     GENTAMICIN <=1 SENSITIVE Sensitive     IMIPENEM <=0.25 SENSITIVE Sensitive      NITROFURANTOIN 64 INTERMEDIATE Intermediate     TRIMETH/SULFA >=320 RESISTANT Resistant     AMPICILLIN/SULBACTAM >=32 RESISTANT Resistant     PIP/TAZO 8 SENSITIVE Sensitive     Extended ESBL POSITIVE Resistant     * >=100,000 COLONIES/mL KLEBSIELLA PNEUMONIAE   Vancomycin resistant enterococcus isolated - MIC*    AMPICILLIN >=32 RESISTANT Resistant     LEVOFLOXACIN >=8 RESISTANT Resistant     NITROFURANTOIN 64 INTERMEDIATE Intermediate     VANCOMYCIN >=32 RESISTANT Resistant     LINEZOLID 2 SENSITIVE Sensitive     * >=100,000 COLONIES/mL VANCOMYCIN RESISTANT ENTEROCOCCUS ISOLATED  Novel Coronavirus, NAA (  Hosp order, Send-out to [facility]; TAT 18-24 hrs     Status: None   Collection Time: 05/30/19  6:41 AM   Specimen: Nasopharyngeal Swab; Respiratory  Result Value Ref Range Status   SARS-CoV-2, NAA NOT DETECTED NOT DETECTED Final    Comment: (NOTE) This nucleic acid amplification test was developed and its performance characteristics determined by [employer]. Nucleic acid amplification tests include PCR and TMA. This test has not been FDA cleared or approved. This test has been authorized by FDA under an Emergency Use Authorization (EUA). This test is only authorized for the duration of time the declaration that circumstances exist justifying the authorization of the emergency use of in vitro diagnostic tests for detection of SARS-CoV-2 virus and/or diagnosis of COVID-19 infection under section 564(b)(1) of the Act, 21 U.S.C. 290SXJ-1(B) (1), unless the authorization is terminated or revoked sooner. When diagnostic testing is negative, the possibility of a false negative result should be considered in the context of a patient's recent exposures and the presence of clinical signs and symptoms consistent with COVID-19. An individual without symptoms of COVID- 19 and who is not shedding SARS-CoV-2 vi rus would expect to have a negative (not detected) result in this assay.  Performed At: [facility] [address] [place], [place] [id number] Rush Farmer MD KP:[id number]    Dante  Final    Comment: Performed at [facility], [place] [address] [place], [place] [address]      Studies: No results found.  Scheduled Meds: . amiodarone  200 mg Oral BID  . calcitRIOL  0.25 mcg Oral Daily  . Chlorhexidine Gluconate Cloth  6 each Topical Daily  . diltiazem  30 mg Oral Q6H  . feeding supplement (PRO-STAT SUGAR FREE 64)  30 mL Oral BID  . hydrocortisone sodium succinate  50 mg Intravenous Q8H  . insulin aspart  0-5 Units Subcutaneous QHS  . insulin aspart  0-9 Units Subcutaneous TID WC  . insulin glargine  12 Units Subcutaneous QHS  . ipratropium-albuterol  3 mL Inhalation BID  . levETIRAcetam  500 mg Oral BID  . linezolid  600 mg Oral Q12H  . Melatonin  6 mg Oral QHS  . midodrine  5 mg Oral TID WC  . mometasone-formoterol  2 puff Inhalation BID  . pantoprazole  40 mg Oral Daily    Continuous Infusions: . sodium chloride 65 mL/hr at 06/01/19 2202  . meropenem (MERREM) IV 1 g (06/02/19 0850)     LOS: 27 days     Alma Friendly, MD [facility]  If 7PM-7AM, please contact night-coverage [email or web address] 06/02/2019, 4:12 PM

## 2019-06-02 NOTE — Consult Note (Signed)
Inpatient Rehab Admissions:  Inpatient Rehab Consult received.  I met with pt and his wife at the bedside for rehabilitation assessment and to discuss goals and expectations of an inpatient rehab admission. Feel pt is a great candidate for CIR. Pt was Independent PTA and has great social support at DC. Pt would like to pursue CIR once medically ready.   Will need to follow for resolution of AKI-whether resolution with renal recovery or declaration of ESRD with HD needs. Note there are possible plans to discontinued dialysis catheter if renal function remains stable by tomorrow? Will need clarification regarding plans for HD.   Please call if questions.   Jhonnie Garner, OTR/L  Rehab Admissions Coordinator  [phone] 06/02/2019 5:45 PM

## 2019-06-02 NOTE — Progress Notes (Signed)
ANTICOAGULATION CONSULT NOTE - Follow Up Consult  Pharmacy Consult for Heparin Indication: atrial fibrillation  No Known Allergies  Patient Measurements: Height: 5\' 11"  (180.3 cm) Weight: 193 lb 6.4 oz (87.7 kg) IBW/kg (Calculated) : 75.3 Heparin Dosing Weight: 86.7 kg  Vital Signs: Temp: 97.8 F (36.6 C) (11/03 1608) Temp Source: Oral (11/03 1608) BP: 102/65 (11/03 1608) Pulse Rate: 109 (11/03 1608)  Labs: Recent Labs    05/31/19 0534 06/01/19 0637 06/02/19 0406  HGB 10.3* 11.7* 10.2*  HCT 33.7* 38.5* 33.6*  PLT 109* 126* 96*  CREATININE 2.70* 2.67* 2.65*    Estimated Creatinine Clearance: 29.6 mL/min (A) (by C-G formula based on SCr of 2.65 mg/dL (H)).  Assessment: 68 yo male previously hospitalized for COVID pneumonia 9/14-9/19 then returned for seizure-like activity and subsequent cardiac arrest 10/7.  On chronic apixaban for afib.  Patient had some hematuria - resulting in hold of heparin Now to resume per MD  Was previously within goal on 1700 units/hr  Goal of Therapy:  Heparin level 0.3-0.7 units/ml aPTT 66-102 seconds Monitor platelets by anticoagulation protocol: Yes   Plan:  Resume heparin gtt 1700 units/hr Initial hep lvl 0000 Daily hep lvl cbc Watch for return of hematuria  Barth Kirks, PharmD, BCPS, BCCCP Clinical Pharmacist [phone]  Please check AMION for all [place] numbers  06/02/2019 4:23 PM

## 2019-06-02 NOTE — Progress Notes (Signed)
Nutrition Follow-up  INTERVENTION:   -D/c Nepro -Prostat liquid protein PO 30 ml BID with meals, each supplement provides 100 kcal, 15 grams protein.  NUTRITION DIAGNOSIS:   Increased nutrient needs related to acute illness as evidenced by estimated needs.  Ongoing.  GOAL:   Patient will meet greater than or equal to 90% of their needs  Progressing.  MONITOR:   PO intake, Supplement acceptance, Labs, Weight trends, I & O's, Skin  ASSESSMENT:   68 yo male admitted with seizure and brief cardiac arrest in the [facility] ED. Required intubation and was transferred to [facility]. 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  10/7: admitted d/t seizure, cardiac arrest, intubated 10/8: extubated 10/21-10/24: re-intubated, transferred to [facility] 10/24: CRRT initiated 10/29: CRRT d/c 10/31: Transferred to 2W for possible need of iHD  **RD working remotely**  Per nephrology note, dialysis not indicated given stable creatinine.   Pt has been on renal diet since 10/28. Supplement was changed from Ensure to Nepro, per documentation pt did not drink Ensure and is not currently drinking Nepro either. Will d/c.  Pt currently consuming 25-95% of meals today. Will add Prostat supplements for additional protein.  Admission weight: 203 lbs. Current weight: 193 lbs. I/Os: +995 ml since 10/20 UOP: 275 ml so far today  Medications reviewed. Labs reviewed: CBGs: 113-139  Diet Order:   Diet Order            Diet renal/carb modified with fluid restriction Diet-HS Snack? Nothing; Fluid restriction: 1200 mL Fluid; Room service appropriate? Yes; Fluid consistency: Thin  Diet effective now              EDUCATION NEEDS:   No education needs have been identified at this time  Skin:  Skin Assessment: Skin Integrity Issues: Skin Integrity Issues:: Stage I, Other (Comment) Stage I: bilateral  sacrum, right buttocks Other: non-pressure wound to left buttocks  Last BM:  10/29  Height:   Ht Readings from Last 1 Encounters:  05/21/19 5\' 11"  (1.803 m)    Weight:   Wt Readings from Last 1 Encounters:  06/01/19 87.7 kg    Ideal Body Weight:  78.2 kg  BMI:  Body mass index is 26.97 kg/m.  Estimated Nutritional Needs:   Kcal:  2150-2350  Protein:  120-130g  Fluid:  >/= 2.1 L  Clayton Bibles, MS, RD, LDN Inpatient Clinical Dietitian Pager: [phone] After Hours Pager: [phone]

## 2019-06-02 NOTE — Progress Notes (Signed)
[facility] NEPHROLOGY PROGRESS NOTE  Assessment/ Plan: Pt is a 68 y.o. yo male male with CHF, A. fib, DM, CKD with baseline creatinine around 2 admitted to [facility] from 9/14-9/19, readmitted on 10/6 status post cardiac arrest and suffered AKI on CKD required CRRT till 10/29.  #AKI on CKD, baseline creatinine around 2.  AKI likely ATN after cardiac arrest and concomitant with COVID-19 infection.  Initially treated with Lasix and then started CRRT from 10/24-10/29. -Increasing urine output to 2.7 L in 24 hours and serum creatinine level remains stable at 2.65.  Volume status is acceptable.  No need for dialysis today.  Plan to discontinue dialysis catheter if renal function remains stable by tomorrow.  Strict ins and out and avoid nephrotoxins.  #Acute hypoxic respiratory failure/pneumonia due to COVID-19 virus: Completed course of remdesivir and steroid.  Clinically improving.  In room air.  #Hypotension/possible adrenal insufficiency: On Solu-Cortef tapering and midodrine.  Lower midodrine dose to 5 mg 3 times daily.  #ESBL UTI possible acute urinary retention: Meropenem since 11/1.  Urology following for some blood clot during urination.  #Acute on chronic systolic CHF: LVEF 40 to 92%.  Subjective: Seen and examined at bedside.  Able to lie flat comfortable.  Denied nausea vomiting chest pain shortness of breath.  Has good urine output.  Objective Vital signs in last 24 hours: Vitals:   06/01/19 1653 06/01/19 1954 06/01/19 2356 06/02/19 0726  BP: 125/78  (!) 103/53 129/90  Pulse: (!) 56  98 (!) 101  Resp:   20   Temp: 97.8 F (36.6 C)  98.7 F (37.1 C) (!) 97.4 F (36.3 C)  TempSrc: Oral   Oral  SpO2: 98% 96% 91% 90%  Weight:      Height:       Weight change:   Intake/Output Summary (Last 24 hours) at 06/02/2019 0829 Last data filed at 06/02/2019 0600 Gross per 24 hour  Intake 4203.98 ml  Output 2750 ml  Net 1453.98 ml       Labs: Basic  Metabolic Panel: Recent Labs  Lab 05/27/19 0615 05/27/19 1605 05/28/19 0504  05/31/19 0534 06/01/19 0637 06/02/19 0406  NA 139 135 136   < > 140 140 140  K 5.4* 4.1 3.8   < > 3.6 3.5 3.8  CL 104 105 102   < > 107 105 105  CO2 26 23 27    < > 22 21* 21*  GLUCOSE 158* 215* 150*   < > 150* 128* 169*  BUN 21 19 20    < > 58* 61* 57*  CREATININE 1.33* 1.22 1.26*   < > 2.70* 2.67* 2.65*  CALCIUM 8.1* 7.6* 8.1*   < > 8.4* 8.2* 7.6*  PHOS 3.3 2.2* 2.4*  2.4*  --   --   --   --    < > = values in this interval not displayed.   Liver Function Tests: Recent Labs  Lab 05/27/19 0400 05/27/19 0615 05/27/19 1605 05/28/19 0504  AST 49*  --   --   --   ALT 78*  --   --   --   ALKPHOS 320*  --   --   --   BILITOT 1.3*  --   --   --   PROT 4.7*  --   --   --   ALBUMIN 2.1* 2.3* 2.2* 2.4*   No results for input(s): LIPASE, AMYLASE in the last 168 hours. No results for input(s): AMMONIA in  the last 168 hours. CBC: Recent Labs  Lab 05/29/19 0605 05/30/19 1112 05/31/19 0534 06/01/19 0637 06/02/19 0406  WBC 10.7* 11.4* 9.2 10.4 7.8  HGB 10.2* 10.8* 10.3* 11.7* 10.2*  HCT 32.8* 34.7* 33.7* 38.5* 33.6*  MCV 101.5* 98.3 99.7 100.0 100.6*  PLT 108* 122* 109* 126* 96*   Cardiac Enzymes: No results for input(s): CKTOTAL, CKMB, CKMBINDEX, TROPONINI in the last 168 hours. CBG: Recent Labs  Lab 06/01/19 0849 06/01/19 1256 06/01/19 1648 06/01/19 2114 06/02/19 0723  GLUCAP 86 79 104* 113* 139*    Iron Studies: No results for input(s): IRON, TIBC, TRANSFERRIN, FERRITIN in the last 72 hours. Studies/Results: US Renal  Result Date: 06/01/2019 CLINICAL DATA:  Hematuria. EXAM: RENAL / URINARY TRACT ULTRASOUND COMPLETE COMPARISON:  Renal ultrasound dated April 13, 2019. FINDINGS: Right Kidney: Renal measurements: 11.8 x 6.8 x 5.8 cm = volume: 243 mL. Mildly increased echogenicity. Mild hydronephrosis. No mass visualized. Left Kidney: Renal measurements: 12.8 x 6.9 x 6.2 cm = volume: 287  mL. Mildly increased echogenicity. Mild hydronephrosis. No mass visualized. Bladder: Appears normal for degree of bladder distention. Other: None. IMPRESSION: 1. Mild left greater than right hydronephrosis. 2. Mildly increased bilateral renal echogenicity, consistent with underlying medical renal disease. Electronically Signed   By: Titus Dubin M.D.   On: 06/01/2019 11:52    Medications: Infusions: . sodium chloride 65 mL/hr at 06/01/19 2202  . linezolid (ZYVOX) IV 600 mg (06/01/19 2305)  . meropenem (MERREM) IV 1 g (06/01/19 2205)    Scheduled Medications: . amiodarone  200 mg Oral BID  . calcitRIOL  0.25 mcg Oral Daily  . Chlorhexidine Gluconate Cloth  6 each Topical Daily  . diltiazem  30 mg Oral Q6H  . feeding supplement (NEPRO CARB STEADY)  237 mL Oral BID BM  . hydrocortisone sodium succinate  50 mg Intravenous Q8H  . insulin aspart  0-5 Units Subcutaneous QHS  . insulin aspart  0-9 Units Subcutaneous TID WC  . insulin glargine  12 Units Subcutaneous QHS  . ipratropium-albuterol  3 mL Inhalation TID  . levETIRAcetam  500 mg Oral BID  . Melatonin  6 mg Oral QHS  . midodrine  5 mg Oral TID WC  . mometasone-formoterol  2 puff Inhalation BID  . pantoprazole  40 mg Oral Daily    have reviewed scheduled and prn medications.  Physical Exam: General:NAD, comfortable Heart:RRR, s1s2 nl Lungs:clear b/l, no crackle Abdomen:soft, Non-tender, non-distended Extremities:No edema Dialysis Access: Temporary IJ site clean.  Dylan Goodman 06/02/2019,8:29 AM  LOS: 27 days  Pager: [phone]

## 2019-06-02 NOTE — Progress Notes (Signed)
PHARMACIST - PHYSICIAN COMMUNICATION DR:   Horris Latino CONCERNING: Antibiotic IV to Oral Route Change Policy  RECOMMENDATION: This patient is receiving linezolid by the intravenous route.  Based on criteria approved by the Pharmacy and Therapeutics Committee, the antibiotic(s) is/are being converted to the equivalent oral dose form(s).   DESCRIPTION: These criteria include:  Patient being treated for a respiratory tract infection, urinary tract infection, cellulitis or clostridium difficile associated diarrhea if on metronidazole  The patient is not neutropenic and does not exhibit a GI malabsorption state  The patient is eating (either orally or via tube) and/or has been taking other orally administered medications for a least 24 hours  The patient is improving clinically and has a Tmax < 100.5  If you have questions about this conversion, please contact the Pharmacy Department  []   [phone] )  [facility] [x]   [phone] )  [facility]  []   [phone] )  [facility] []   [phone] )  [place], PharmD PGY1 Ambulatory Care Pharmacy Resident [employer] Phone: [phone]

## 2019-06-02 NOTE — Progress Notes (Signed)
Physical Therapy Treatment Patient Details Name: Dylan Goodman MRN: [id number] DOB: Jun 01, 1951 Today's Date: 06/02/2019    History of Present Illness 68 y/o male treated for COVID pneumonia on September 13 returned to the ED on 10/7 with seizure and cardiac arrest.  Transitioned to floor where he had AKI which resolved but then later in his admission developed worsening respiratory failure and renal failure again.  Ultimately re-intubated on 10/21 and sent back to [facility]. Extubated 10/24. Off CRRT. Now back to [facility] with UTI and hematuria and distended abdomen with pain    PT Comments    Pt has wife as visitor for first time about a month and he is very happy and eager to show her his progress. Pt had foley placed yesterday which has resolved his abdominal pain. Pt is currently limited by decreased strength, balance and endurance. Pt requires min-modA for bed mobility, modAx2 for sit>stand and minAx2 for 2 feet of lateral stepping along side of bed. Pt able to stand for 5 minutes progressing to being able to stand with single UE support. D/c plans continue to be appropriate at this time. PT will continue to follow acutely.   Follow Up Recommendations  CIR     Equipment Recommendations  Other (comment)(TBD at next venue)    Recommendations for Other Services Rehab consult     Precautions / Restrictions Precautions Precautions: Fall Precaution Comments: abdomen pain from UTI Restrictions Weight Bearing Restrictions: No    Mobility  Bed Mobility Overal bed mobility: Needs Assistance Bed Mobility: Sidelying to Sit;Sit to Sidelying   Sidelying to sit: Min assist     Sit to sidelying: Mod assist;+2 for physical assistance;+2 for safety/equipment General bed mobility comments: initially with min A to sit EOB from sidelying, support needed at trunk. modA for managment of LE back into bed, pt able to bridge hips and push himself up in flattened bed without assist  Transfers Overall  transfer level: Needs assistance Equipment used: 2 person hand held assist Transfers: Sit to/from Stand Sit to Stand: Mod assist;+2 physical assistance         General transfer comment: initially minA to raise hips off bed, but requires blocking of bilateral knees and physical assist to posteriorly rotate hips to come into fully upright, pt with increased UE support initially   Ambulation/Gait Ambulation/Gait assistance: Min assist;+2 physical assistance Gait Distance (Feet): 2 Feet Assistive device: Rolling walker (2 wheeled) Gait Pattern/deviations: Step-to pattern Gait velocity: slowed Gait velocity interpretation: <1.31 ft/sec, indicative of household ambulator General Gait Details: minA for steadying with lateral steps towards HoB          Balance Overall balance assessment: Needs assistance Sitting-balance support: Feet supported;Bilateral upper extremity supported Sitting balance-Leahy Scale: Fair Sitting balance - Comments: with initial sitting EoB dizziness but able to progress from min A for maintainence of balance to min guard with no UE support as dizziness subsided, sat EoB for 5 minutes    Standing balance support: Bilateral upper extremity supported;Single extremity supported Standing balance-Leahy Scale: Fair Standing balance comment: able to progress from minA for steadying to single UE support on RW, stood for 5 minutes                            Cognition Arousal/Alertness: Awake/alert Behavior During Therapy: WFL for tasks assessed/performed Overall Cognitive Status: Within Functional Limits for tasks assessed  General Comments General comments (skin integrity, edema, etc.): Pt with foley in place with much less pain. Pt's wife in room and he is obviously happy to be with her. Pt with max HR of 144 bpm with stepping to HoB, returned to 100 bpm with back in supine.       Pertinent  Vitals/Pain Pain Assessment: Faces Faces Pain Scale: Hurts a little bit Pain Location: buttock with movement Pain Descriptors / Indicators: Discomfort;Grimacing;Moaning;Sharp           PT Goals (current goals can now be found in the care plan section) Acute Rehab PT Goals Patient Stated Goal: to see wife tomorrow PT Goal Formulation: With patient Time For Goal Achievement: 06/08/19 Potential to Achieve Goals: Fair Progress towards PT goals: Progressing toward goals    Frequency    Min 4X/week      PT Plan Current plan remains appropriate;Frequency needs to be updated       AM-PAC PT "6 Clicks" Mobility   Outcome Measure  Help needed turning from your back to your side while in a flat bed without using bedrails?: A Little Help needed moving from lying on your back to sitting on the side of a flat bed without using bedrails?: A Little Help needed moving to and from a bed to a chair (including a wheelchair)?: Total Help needed standing up from a chair using your arms (e.g., wheelchair or bedside chair)?: Total Help needed to walk in hospital room?: Total Help needed climbing 3-5 steps with a railing? : Total 6 Click Score: 10    End of Session Equipment Utilized During Treatment: Gait belt Activity Tolerance: Patient tolerated treatment well;No increased pain Patient left: in bed;with call bell/phone within reach;with bed alarm set;with family/visitor present Nurse Communication: Mobility status PT Visit Diagnosis: Unsteadiness on feet (R26.81);Other abnormalities of gait and mobility (R26.89);Muscle weakness (generalized) (M62.81);Difficulty in walking, not elsewhere classified (R26.2);Pain Pain - part of body: (abdomen)     Time: 1610-9604 PT Time Calculation (min) (ACUTE ONLY): 30 min  Charges:  $Therapeutic Activity: 23-37 mins                     Haruki Arnold B. Migdalia Dk PT, DPT Acute Rehabilitation Services Pager [phone] Office [phone]    [place] 06/02/2019, 4:15 PM

## 2019-06-03 LAB — CBC
HCT: 33.5 % — ABNORMAL LOW (ref 39.0–52.0)
Hemoglobin: 10.3 g/dL — ABNORMAL LOW (ref 13.0–17.0)
MCH: 30.7 pg (ref 26.0–34.0)
MCHC: 30.7 g/dL (ref 30.0–36.0)
MCV: 99.7 fL (ref 80.0–100.0)
Platelets: 89 10*3/uL — ABNORMAL LOW (ref 150–400)
RBC: 3.36 MIL/uL — ABNORMAL LOW (ref 4.22–5.81)
RDW: 19.8 % — ABNORMAL HIGH (ref 11.5–15.5)
WBC: 8 10*3/uL (ref 4.0–10.5)
nRBC: 0 % (ref 0.0–0.2)

## 2019-06-03 LAB — BASIC METABOLIC PANEL
Anion gap: 10 (ref 5–15)
BUN: 57 mg/dL — ABNORMAL HIGH (ref 8–23)
CO2: 21 mmol/L — ABNORMAL LOW (ref 22–32)
Calcium: 7 mg/dL — ABNORMAL LOW (ref 8.9–10.3)
Chloride: 106 mmol/L (ref 98–111)
Creatinine, Ser: 2.42 mg/dL — ABNORMAL HIGH (ref 0.61–1.24)
GFR calc Af Amer: 31 mL/min — ABNORMAL LOW (ref >60)
GFR calc non Af Amer: 27 mL/min — ABNORMAL LOW (ref >60)
Glucose, Bld: 192 mg/dL — ABNORMAL HIGH (ref 70–99)
Potassium: 2.8 mmol/L — ABNORMAL LOW (ref 3.5–5.1)
Sodium: 137 mmol/L (ref 135–145)

## 2019-06-03 LAB — GLUCOSE, CAPILLARY
Glucose-Capillary: 105 mg/dL — ABNORMAL HIGH (ref 70–99)
Glucose-Capillary: 142 mg/dL — ABNORMAL HIGH (ref 70–99)
Glucose-Capillary: 139 mg/dL — ABNORMAL HIGH (ref 70–99)
Glucose-Capillary: 136 mg/dL — ABNORMAL HIGH (ref 70–99)

## 2019-06-03 LAB — HEPARIN LEVEL (UNFRACTIONATED)
Heparin Unfractionated: 0.32 IU/mL (ref 0.30–0.70)
Heparin Unfractionated: 0.59 IU/mL (ref 0.30–0.70)

## 2019-06-03 MED ORDER — POTASSIUM CHLORIDE CRYS ER 20 MEQ PO TBCR
40.0000 meq | EXTENDED_RELEASE_TABLET | Freq: Three times a day (TID) | ORAL | Status: AC
  Administered 2019-06-03 – 2019-06-04 (×4): 40 meq via ORAL
  Filled 2019-06-03 (×4): qty 2

## 2019-06-03 NOTE — Progress Notes (Signed)
VAST consulted for assessment of temporary [place] in pt's right neck. Pt's nurse reported issues with line dressing throughout night. VAST RN assessed dressing and site; dressing with a lot of tape reinforcement with dried blood noted where dressing coming loose. PT working with pt and getting him out of bed at this time. Will return after noon to change dressing completely and clean site.

## 2019-06-03 NOTE — TOC Progression Note (Signed)
Transition of Care [facility]) - Progression Note    Patient Details  Name: Dylan Goodman MRN: [id number] Date of Birth: 03-18-1951  Transition of Care [facility]) CM/SW Contact  Maryclare Labrador, RN Phone Number: 06/03/2019, 9:39 AM  Clinical Narrative:   Pt now transferred back to [facility] for IHD however currently only hold secondary to creatinine stabilizing (daily assessments).  Pt is now testing negative for COVID.  CIR continuing to follow for possible admission however need for perm HD will have to be finalized prior to facility accepting pt.  Pt currently on IV heparin    Expected Discharge Plan: [place] Barriers to Discharge: (coVID)  Expected Discharge Plan and Services Expected Discharge Plan: [place] arrangements for the past 2 months: Single Family Home                 DME Arranged: Walker rolling DME Agency: [employer] Date DME Agency Contacted: 05/08/19 Time DME Agency Contacted: [phone] Representative spoke with at DME Agency: [place] ([place]) Interventions    Readmission Risk Interventions Readmission Risk Prevention Plan 05/08/2019  Transportation Screening Complete  Medication Review [employer]) Complete  HRI or [place] (No Data)

## 2019-06-03 NOTE — Progress Notes (Signed)
Assisted pt with removal of bed pan, and foam drsg. Cleaned patient sacral area noticed some excoriation of the buttocks. Pt offered choice between foam dressing or barrier cream due to patient stating the tape bothered skin. Patient choose barrier cream.

## 2019-06-03 NOTE — Progress Notes (Signed)
ANTICOAGULATION CONSULT NOTE  Pharmacy Consult for Heparin Indication: atrial fibrillation   Patient Measurements: Height: 5\' 11"  (180.3 cm) Weight: 193 lb 6.4 oz (87.7 kg) IBW/kg (Calculated) : 75.3 Heparin Dosing Weight: 86.7 kg  Vital Signs: Temp: 97.6 F (36.4 C) (11/04 0727) Temp Source: Oral (11/04 0727) BP: 140/80 (11/04 0727) Pulse Rate: 110 (11/04 0727)  Labs: Recent Labs    06/01/19 0637 06/02/19 0406 06/03/19 0016 06/03/19 0758  HGB 11.7* 10.2* 10.3*  --   HCT 38.5* 33.6* 33.5*  --   PLT 126* 96* 89*  --   HEPARINUNFRC  --   --  0.32 0.59  CREATININE 2.67* 2.65* 2.42*  --      Assessment: 68 yo male previously hospitalized for COVID pneumonia 9/14-9/19 then returned for seizure-like activity and subsequent cardiac arrest 10/7.  On chronic apixaban for afib.  Patient had some hematuria - now resolved. Heparin level is now therapeutic x2. hgb 10.3, plts 89.   Goal of Therapy:  Heparin level 0.3-0.7 units/ml aPTT 66-102 seconds Monitor platelets by anticoagulation protocol: Yes    Plan:  Continue heparin gtt at 1700 units/hr Daily HL, CBC Watch for return of hematuria Monitor thrombocytopenia   Harvel Quale 06/03/2019 9:06 AM

## 2019-06-03 NOTE — Progress Notes (Signed)
[facility] NEPHROLOGY PROGRESS NOTE  Assessment/ Plan: Pt is a 68 y.o. yo male male with CHF, A. fib, DM, CKD with baseline creatinine around 2 admitted to [facility] from 9/14-9/19, readmitted on 10/6 status post cardiac arrest and suffered AKI on CKD required CRRT till 10/29.  #AKI on CKD, baseline creatinine around 2.  AKI likely ATN after cardiac arrest and concomitant with COVID-19 infection.  Initially treated with Lasix and then started CRRT from 10/24-10/29. -Urine output is marginal however serum creatinine level is trending down to 2.4 today.  Volume status looks acceptable and he has no uremic feature.  No need for dialysis.  Needed strict ins and out.  Hopefully we can avoid dialysis.  For now we will keep HD catheter.  #Hypokalemia: Repleted potassium chloride.  Monitor lab.  Encourage oral intake.  #Acute hypoxic respiratory failure/pneumonia due to COVID-19 virus: Completed course of remdesivir and steroid.  Clinically improving.  In room air.  #Hypotension/possible adrenal insufficiency: On Solu-Cortef tapering and midodrine.  Lower midodrine dose to 5 mg 3 times daily.  Blood pressure is better controlled.  #ESBL UTI possible acute urinary retention: Meropenem since 11/1.  Urology following for some blood clot during urination.  #Acute on chronic systolic CHF: LVEF 40 to 25%.  Subjective: Seen and examined at bedside.  Able to lie flat comfortable.  Denied nausea vomiting chest pain shortness of breath.  No new event.  Doing well. Objective Vital signs in last 24 hours: Vitals:   06/02/19 2309 06/03/19 0602 06/03/19 0718 06/03/19 0727  BP: 127/74 125/82  140/80  Pulse: (!) 101   (!) 110  Resp: 20     Temp: 98.7 F (37.1 C)   97.6 F (36.4 C)  TempSrc:    Oral  SpO2: 95%  95% 91%  Weight:      Height:       Weight change:   Intake/Output Summary (Last 24 hours) at 06/03/2019 1110 Last data filed at 06/03/2019 8527 Gross per 24 hour   Intake 647.98 ml  Output 175 ml  Net 472.98 ml       Labs: Basic Metabolic Panel: Recent Labs  Lab 05/27/19 1605 05/28/19 0504  06/01/19 0637 06/02/19 0406 06/03/19 0016  NA 135 136   < > 140 140 137  K 4.1 3.8   < > 3.5 3.8 2.8*  CL 105 102   < > 105 105 106  CO2 23 27   < > 21* 21* 21*  GLUCOSE 215* 150*   < > 128* 169* 192*  BUN 19 20   < > 61* 57* 57*  CREATININE 1.22 1.26*   < > 2.67* 2.65* 2.42*  CALCIUM 7.6* 8.1*   < > 8.2* 7.6* 7.0*  PHOS 2.2* 2.4*  2.4*  --   --   --   --    < > = values in this interval not displayed.   Liver Function Tests: Recent Labs  Lab 05/27/19 1605 05/28/19 0504  ALBUMIN 2.2* 2.4*   No results for input(s): LIPASE, AMYLASE in the last 168 hours. No results for input(s): AMMONIA in the last 168 hours. CBC: Recent Labs  Lab 05/30/19 1112 05/31/19 0534 06/01/19 0637 06/02/19 0406 06/03/19 0016  WBC 11.4* 9.2 10.4 7.8 8.0  HGB 10.8* 10.3* 11.7* 10.2* 10.3*  HCT 34.7* 33.7* 38.5* 33.6* 33.5*  MCV 98.3 99.7 100.0 100.6* 99.7  PLT 122* 109* 126* 96* 89*   Cardiac Enzymes: No results for input(s):  CKTOTAL, CKMB, CKMBINDEX, TROPONINI in the last 168 hours. CBG: Recent Labs  Lab 06/02/19 0723 06/02/19 1136 06/02/19 1606 06/02/19 2045 06/03/19 0727  GLUCAP 139* 217* 153* 194* 105*    Iron Studies: No results for input(s): IRON, TIBC, TRANSFERRIN, FERRITIN in the last 72 hours. Studies/Results: US Renal  Result Date: 06/01/2019 CLINICAL DATA:  Hematuria. EXAM: RENAL / URINARY TRACT ULTRASOUND COMPLETE COMPARISON:  Renal ultrasound dated April 13, 2019. FINDINGS: Right Kidney: Renal measurements: 11.8 x 6.8 x 5.8 cm = volume: 243 mL. Mildly increased echogenicity. Mild hydronephrosis. No mass visualized. Left Kidney: Renal measurements: 12.8 x 6.9 x 6.2 cm = volume: 287 mL. Mildly increased echogenicity. Mild hydronephrosis. No mass visualized. Bladder: Appears normal for degree of bladder distention. Other: None.  IMPRESSION: 1. Mild left greater than right hydronephrosis. 2. Mildly increased bilateral renal echogenicity, consistent with underlying medical renal disease. Electronically Signed   By: Titus Dubin M.D.   On: 06/01/2019 11:52    Medications: Infusions: . heparin 1,700 Units/hr (06/03/19 0627)  . meropenem (MERREM) IV 1 g (06/03/19 0950)    Scheduled Medications: . amiodarone  200 mg Oral BID  . calcitRIOL  0.25 mcg Oral Daily  . Chlorhexidine Gluconate Cloth  6 each Topical Daily  . diltiazem  30 mg Oral Q6H  . feeding supplement (PRO-STAT SUGAR FREE 64)  30 mL Oral BID  . hydrocortisone sodium succinate  50 mg Intravenous Q12H  . insulin aspart  0-5 Units Subcutaneous QHS  . insulin aspart  0-9 Units Subcutaneous TID WC  . insulin glargine  12 Units Subcutaneous QHS  . ipratropium-albuterol  3 mL Inhalation BID  . levETIRAcetam  500 mg Oral BID  . linezolid  600 mg Oral Q12H  . Melatonin  6 mg Oral QHS  . midodrine  5 mg Oral TID WC  . mometasone-formoterol  2 puff Inhalation BID  . pantoprazole  40 mg Oral Daily  . potassium chloride  40 mEq Oral TID    have reviewed scheduled and prn medications.  Physical Exam: General:NAD, comfortable Heart:RRR, s1s2 nl Lungs:clear b/l, no crackle Abdomen:soft, Non-tender, non-distended Extremities:No edema Dialysis Access: Temporary IJ site clean.  Site looks clean  [employer] 06/03/2019,11:10 AM  LOS: 28 days  Pager: [phone]

## 2019-06-03 NOTE — Progress Notes (Signed)
Physical Therapy Treatment Patient Details Name: Dylan Goodman MRN: [id number] DOB: 08-21-1950 Today's Date: 06/03/2019    History of Present Illness 68 y/o male treated for COVID pneumonia on September 13 returned to the ED on 10/7 with seizure and cardiac arrest.  Transitioned to floor where he had AKI which resolved but then later in his admission developed worsening respiratory failure and renal failure again.  Ultimately re-intubated on 10/21 and sent back to [facility]. Extubated 10/24. Off CRRT. Now back to [facility] with UTI and hematuria and distended abdomen with pain    PT Comments    Pt agreeable to therapy this morning and continues to make progress towards his goals. Pt continues to be limited in safe mobility by decreased strength and endurance. Pt requires minA for bed mobility, modAx2 for transfer and minA for stepping 2 feet to recliner.  CIR admissions in room at end of session. Pt continues to be motivated to progress his mobility at CIR.    Follow Up Recommendations  CIR     Equipment Recommendations  Other (comment)(TBD at next venue)    Recommendations for Other Services Rehab consult     Precautions / Restrictions Precautions Precautions: Fall Precaution Comments: abdomen pain from UTI Restrictions Weight Bearing Restrictions: No    Mobility  Bed Mobility Overal bed mobility: Needs Assistance Bed Mobility: Sidelying to Sit;Sit to Sidelying   Sidelying to sit: Min assist       General bed mobility comments: minA with trunk support for coming to EoB  Transfers Overall transfer level: Needs assistance Equipment used: 2 person hand held assist Transfers: Sit to/from Stand Sit to Stand: Mod assist;+2 physical assistance         General transfer comment: pt continues to be modAx2 for powerup to RW however was able to achieve more quickly with increased stability. pt requires modAx2 for sitting due to decreased eccentric control to lower into lower  recliner  Ambulation/Gait Ambulation/Gait assistance: Min assist;+2 physical assistance Gait Distance (Feet): 2 Feet Assistive device: Rolling walker (2 wheeled) Gait Pattern/deviations: Step-to pattern Gait velocity: slowed Gait velocity interpretation: <1.31 ft/sec, indicative of household ambulator General Gait Details: minA for steadying with stepping to recliner         Balance Overall balance assessment: Needs assistance Sitting-balance support: Feet supported;Bilateral upper extremity supported Sitting balance-Leahy Scale: Fair Sitting balance - Comments: with initial sitting EoB dizziness but able to progress from min A for maintainence of balance to min guard with no UE support as dizziness subsided, sat EoB for 5 minutes    Standing balance support: Bilateral upper extremity supported;Single extremity supported Standing balance-Leahy Scale: Fair Standing balance comment: able to progress from minA for steadying to single UE support on RW, stood for 5 minutes                            Cognition Arousal/Alertness: Awake/alert Behavior During Therapy: WFL for tasks assessed/performed Overall Cognitive Status: Within Functional Limits for tasks assessed                                           General Comments General comments (skin integrity, edema, etc.): VSS, pt IV draining during session, IV nurse in room at end of session to assess      Pertinent Vitals/Pain Pain Assessment: Faces Faces Pain Scale: Hurts little  more Pain Location: buttock with movement Pain Descriptors / Indicators: Discomfort;Grimacing;Moaning;Sharp Pain Intervention(s): Limited activity within patient's tolerance;Monitored during session;Repositioned;Other (comment)(use of bed pad to reduce friction on sacrum)     PT Goals (current goals can now be found in the care plan section) Acute Rehab PT Goals Patient Stated Goal: to see wife tomorrow PT Goal  Formulation: With patient Time For Goal Achievement: 06/08/19 Potential to Achieve Goals: Fair Progress towards PT goals: Progressing toward goals    Frequency    Min 4X/week      PT Plan Current plan remains appropriate;Frequency needs to be updated       AM-PAC PT "6 Clicks" Mobility   Outcome Measure  Help needed turning from your back to your side while in a flat bed without using bedrails?: A Little Help needed moving from lying on your back to sitting on the side of a flat bed without using bedrails?: A Little Help needed moving to and from a bed to a chair (including a wheelchair)?: Total Help needed standing up from a chair using your arms (e.g., wheelchair or bedside chair)?: Total Help needed to walk in hospital room?: Total Help needed climbing 3-5 steps with a railing? : Total 6 Click Score: 10    End of Session Equipment Utilized During Treatment: Gait belt Activity Tolerance: Patient tolerated treatment well;No increased pain Patient left: in bed;with call bell/phone within reach;with bed alarm set;with family/visitor present Nurse Communication: Mobility status PT Visit Diagnosis: Unsteadiness on feet (R26.81);Other abnormalities of gait and mobility (R26.89);Muscle weakness (generalized) (M62.81);Difficulty in walking, not elsewhere classified (R26.2);Pain Pain - part of body: (abdomen)     Time: 6060-0459 PT Time Calculation (min) (ACUTE ONLY): 24 min  Charges:  $Gait Training: 8-22 mins $Therapeutic Activity: 8-22 mins                     Sakara Lehtinen B. Migdalia Dk PT, DPT Acute Rehabilitation Services Pager [phone] Office [phone]    [place] 06/03/2019, 1:24 PM

## 2019-06-03 NOTE — Progress Notes (Signed)
VAST to pt's bedside to change dialysis catheter dressing. Catheter insertion site with large blood clot and continuous bleeding. Old dressing removed and site cleaned profusely; sterile gauze placed over insertion site and pressure held X 25 mins. New Biopatch and dressing placed. Educated pt's nurse to contact IVT with any new drainage or peeling of dressing.

## 2019-06-03 NOTE — PMR Pre-admission (Signed)
PMR Admission Coordinator Pre-Admission Assessment  Patient: Dylan Goodman is an 68 y.o., male MRN: [id number] DOB: [birth year] Height: '5\' 11"'  (180.3 cm) Weight: 87.7 kg  Insurance Information HMO:     PPO:      PCP:      IPA:      80/20: yes     OTHER:  PRIMARY: Medicare Part A and B      Policy#: [id number]      Subscriber: Patient CM Name:       Phone#:      Fax#:  Pre-Cert#:       Employer:  Benefits:  Phone #: online     Name: verified eligibility via [place] on 06/04/2019 Eff. Date: Part A and B effective 06/29/2018     Deduct: $1,408      Out of Pocket Max: NA      Life Max: NA CIR: Covered per Medicare guidelines once yearly deductible has been met.      SNF: days 1-20, 100%; days 21-100, 80%.  Outpatient: 80%     Co-Pay: 20% Home Health: 100%      Co-Pay: 0% DME: 80%     Co-Pay: 20% Providers: Pt's choice SECONDARY: BCBS      Policy#: [id number]      Subscriber: Patient CM Name:       Phone#:      Fax#:  Pre-Cert#:       Employer:  Benefits:  Phone #: [phone]     Name:  Eff. Date:      Deduct:       Out of Pocket Max:       Life Max:  CIR:       SNF:  Outpatient:      Co-Pay:  Home Health:       Co-Pay:  DME:      Co-Pay:   Medicaid Application Date      Case Manager:  Disability Application Date:       Case Worker:   The "Data Collection Information Summary" for patients in Inpatient Rehabilitation Facilities with attached "Privacy Act [place] Records" was provided and verbally reviewed with: Patient  Emergency Contact Information Contact Information    Name Relation Home Work Brewer Spouse [phone]  [phone]      Current Medical History  Patient Admitting Diagnosis: Debility  History of Present Illness: Dylan Goodman is a 68 year old male with history of A fib, T2DM, CKD, nasopharyngeal cancer, Menetrier disease,  PUD/GIB, CAD s/p CABG, COVID 19 (admitted [facility] 9/14-9/19) who has had issues with dizziness/CP  treated with IVF in ED on 10/5 and recurrent episode on 10/6. Pt had a 2 minutes witnessed tonic clonic seizure that responded to versed. He was confused at admission to [facility] and COVID test positive. CT head negative for acute changes. Plans were to transfer patient to [facility] but he developed VT v/s Afib with RVR leading to respiratory arrest treated with defibrillation and CPR --down time 11 minutes and he was intubated and transferred to [facility] for management. He was started on broad spectrum antibiotics, amiodarone and BLE dopplers negative for DVT. He was loaded with Keppra and EEG done showing evidence of moderate diffuse encephalopathy with excessive beta activity likely due to benzo's. He tolerated extubation to BIPAP on 10/08.  Dr. Moshe Cipro consulted for acute on chronic renal failure with rise in SCr 5.17 and felt that AKI likely due to ATN in setting of current illness and cardiac  arrest. Metabolic acidosis improved with IV bicarb. On 10/15, he developed hypoxia with increased WOB and was started on 30L/100%HFNC and treated with IV lasix as well as steroids due to concerns of COPD exacerbation.   Dr. Vaughan Browner expressed suspicion of Covid pneumonitis due to continued high flow needs.  CT chest done 10/19 revealing extensive patchy consolidation with ground glass opacity prominent in BUL suggestive of PNA, atelectasis or possible early post-inflammatory fibrosis. Incidental findings of moderate to severe centrilobar and paraseptal emphysema with suggestion of diffuse gastric wall thickening--unchanged.  Due to lack of improvement in symptoms, decision was made to treat him with Remdesivir and antibiotics.  He developed progressive confusion with agitation and was intubated from 10/21 and transferred to [facility].   He developed recurrent acute on CRF with uremia and started on CRRT 10/25.  He did develop hematuria due to acute cystitis--heparin d/c and started on Rocephin per Dr. Noland Fordyce input--> changed to  meropenum and Linezolid for ESBL Klebsiella/VRE UTI.   He was transferred back to [facility] on 10/30. He has required midodrine as well as Solu Cortef for hypotension and possible adrenal insufficiency.  CRRT d/c 10/29 and volume status/renal status stable without uremic features. Patient had bladder distension with clots in urine and pelvic ultrasound 11/02 revealing mild left > right hydronephrosis therefore foley placed after discussion with GU due to concerns of urinary  retention. Urine output decreasing He is now off IV heparin and transitioned to Eliquis. He will not require dialysis. Pt has been evaluated by therapies with recommendation for CIR.   Complete NIHSS TOTAL: 0  Patient's medical record from [facility] has been reviewed by the rehabilitation admission coordinator and physician.  Past Medical History  Past Medical History:  Diagnosis Date  . Abdominal aneurysm ([place]) 02/2019   4 cm infrarenal/3.8 cm saccular distal abdominal aortic aneurysm.   Marland Kitchen Atrial fibrillation ([place])   . Bleeding gastrointestinal   . BPH (benign prostatic hyperplasia)   . CAD (coronary artery disease)    Status post CABG and PCI  . 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 Cardiomyopathy ([place])   . Chronic depression   . CKD (chronic kidney disease)   . Diabetes mellitus ([place])   . Gastric ulcer with hemorrhage   . Gastritis   . GERD (gastroesophageal reflux disease)   . Hx of CABG 12/1994  . IDA (iron deficiency anemia)   . 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 Myocardial infarction ([place]) 2017  . Personal history of radiation therapy 05/30/2018   39 treatments with chemotherapy nasopharyngeal cancer  . 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  Prior Rehab/Hospitalizations Has the patient had prior rehab or hospitalizations prior to admission? Yes  Has the patient had major surgery during 100 days prior to admission? Yes   Current Medications  Current Facility-Administered Medications:  .  acetaminophen (TYLENOL) tablet 650 mg, 650 mg, Oral, Q4H PRN, Bonnielee Haff, MD, 650 mg at 06/01/19 0856 .  amiodarone (PACERONE) tablet 200 mg, 200 mg, Oral, BID, Bonnielee Haff, MD, 200 mg at 06/05/19 0906 .  apixaban (ELIQUIS) tablet 5 mg, 5 mg, Oral, BID, Shahmehdi, Seyed A, MD, 5 mg at 06/05/19 0906 .  calcitRIOL (ROCALTROL) capsule 0.25  mcg, 0.25 mcg, Oral, Daily, Bonnielee Haff, MD, 0.25 mcg at 06/05/19 0906 .  Chlorhexidine Gluconate Cloth 2 % PADS 6 each, 6 each, Topical, Daily, Bonnielee Haff, MD, 6 each at 06/05/19 [phone] .  diltiazem (CARDIZEM CD) 24 hr capsule 240 mg, 240 mg, Oral, Daily, Shahmehdi, Seyed A, MD, 240 mg at 06/05/19 0906 .  feeding supplement (PRO-STAT SUGAR FREE 64) liquid 30 mL, 30 mL, Oral, BID, Alma Friendly, MD, 30 mL at 06/05/19 0906 .  HYDROcodone-acetaminophen (NORCO/VICODIN) 5-325 MG per tablet 1-2 tablet, 1-2 tablet, Oral, Q4H PRN, Alma Friendly, MD, 2 tablet at 06/05/19 0338 .  hydrOXYzine (ATARAX/VISTARIL) tablet 25 mg, 25 mg, Oral, TID PRN, Bonnielee Haff, MD, 25 mg at 05/29/19 0006 .  insulin aspart (novoLOG) injection 0-5 Units, 0-5 Units, Subcutaneous, QHS, Bonnielee Haff, MD, 2 Units at 06/02/19 2116 .  insulin aspart (novoLOG) injection 0-9 Units, 0-9 Units, Subcutaneous, TID WC, Bonnielee Haff, MD, 1 Units at 06/04/19 1249 .  insulin glargine (LANTUS) injection 12 Units, 12 Units, Subcutaneous, QHS, Bonnielee Haff, MD, 12 Units at 06/04/19 2254 .  ipratropium-albuterol (DUONEB) 0.5-2.5 (3) MG/3ML nebulizer solution 3 mL, 3 mL, Inhalation, BID, 3 mL at 06/05/19 0754 **OR** [DISCONTINUED] ipratropium-albuterol (DUONEB) 0.5-2.5 (3) MG/3ML nebulizer solution 3 mL, 3 mL,  Nebulization, Q6H, McClung, Kimberlee Nearing, MD .  lactobacillus acidophilus (BACID) tablet 2 tablet, 2 tablet, Oral, TID, Shahmehdi, Seyed A, MD, 2 tablet at 06/05/19 0910 .  levETIRAcetam (KEPPRA) tablet 500 mg, 500 mg, Oral, BID, Bonnielee Haff, MD, 500 mg at 06/05/19 0906 .  linezolid (ZYVOX) tablet 600 mg, 600 mg, Oral, Q12H, Donne Hazel, MD, 600 mg at 06/05/19 0910 .  Melatonin TABS 6 mg, 6 mg, Oral, QHS, Bonnielee Haff, MD, 6 mg at 06/04/19 2255 .  midodrine (PROAMATINE) tablet 5 mg, 5 mg, Oral, TID WC, Rosita Fire, MD, 5 mg at 06/05/19 0906 .  mometasone-formoterol (DULERA) 200-5 MCG/ACT inhaler 2 puff, 2 puff, Inhalation, BID, Bonnielee Haff, MD, 2 puff at 06/05/19 0754 .  pantoprazole (PROTONIX) EC tablet 40 mg, 40 mg, Oral, Daily, Bonnielee Haff, MD, 40 mg at 06/05/19 0906 .  polyethylene glycol (MIRALAX / GLYCOLAX) packet 17 g, 17 g, Oral, Daily PRN, Bonnielee Haff, MD, 17 g at 06/01/19 0858 .  senna (SENOKOT) tablet 8.6 mg, 1 tablet, Oral, Daily PRN, Bonnielee Haff, MD .  sodium bicarbonate tablet 650 mg, 650 mg, Oral, BID, Rosita Fire, MD  Patients Current Diet:  Diet Order            Diet - low sodium heart healthy        Diet Carb Modified Fluid consistency: Thin; Room service appropriate? Yes  Diet effective now              Precautions / Restrictions Precautions Precautions: Fall Precaution Comments: abdomen pain from UTI Restrictions Weight Bearing Restrictions: No   Has the patient had 2 or more falls or a fall with injury in the past year? No  Prior Activity Level Community (5-7x/wk): pt is a retired Public house manager. Drives PTA, Independent without AD PTA  Prior Functional Level Self Care: Did the patient need help bathing, dressing, using the toilet or eating? Independent  Indoor Mobility: Did the patient need assistance with walking from room to room (with or without device)? Independent  Stairs: Did  the patient need assistance with internal or external stairs (with or without device)? Independent  Functional Cognition: Did the patient  need help planning regular tasks such as shopping or remembering to take medications? Independent  Home Assistive Devices / Equipment Home Assistive Devices/Equipment: Hearing aid Home Equipment: Grab bars - toilet, Walker - 2 wheels  Prior Device Use: Indicate devices/aids used by the patient prior to current illness, exacerbation or injury? None of the above  Current Functional Level Cognition  Overall Cognitive Status: Within Functional Limits for tasks assessed Orientation Level: Oriented X4 General Comments: able to recall therapists from previous sessions    Extremity Assessment (includes Sensation/Coordination)  Upper Extremity Assessment: Defer to OT evaluation  Lower Extremity Assessment: Generalized weakness    ADLs  Overall ADL's : Needs assistance/impaired Eating/Feeding: Set up, Sitting Eating/Feeding Details (indicate cue type and reason): cues for pacing self during SOB episode Grooming: Set up, Sitting Upper Body Bathing: Minimal assistance, Sitting Lower Body Bathing: Maximal assistance, Sitting/lateral leans, Sit to/from stand Upper Body Dressing : Minimal assistance, Sitting Lower Body Dressing: Maximal assistance, Sitting/lateral leans, Sit to/from stand Toilet Transfer: Moderate assistance, +2 for physical assistance, +2 for safety/equipment Toilet Transfer Details (indicate cue type and reason): max A +2 to stand this date from [place] and Hygiene: Total assistance, Sit to/from stand Toileting - Clothing Manipulation Details (indicate cue type and reason): total A to clean peri area while standing Functional mobility during ADLs: Minimal assistance(sitting EOB) General ADL Comments: mostly focused on exercise to improve BADL tolerance, cont to be limited by UTI symptoms    Mobility  Overal bed  mobility: Needs Assistance Bed Mobility: Sidelying to Sit, Sit to Sidelying Rolling: Min assist Sidelying to sit: Min assist Supine to sit: Max assist Sit to supine: Min assist Sit to sidelying: Mod assist, +2 for physical assistance, +2 for safety/equipment General bed mobility comments: pt in bed/chair position for exercise 2/2 abdomen pain    Transfers  Overall transfer level: Needs assistance Equipment used: 2 person hand held assist Transfers: Sit to/from Stand Sit to Stand: Mod assist, +2 physical assistance General transfer comment: pt continues to be modAx2 for powerup to RW however was able to achieve more quickly with increased stability. pt requires modAx2 for sitting due to decreased eccentric control to lower into lower recliner    Ambulation / Gait / Stairs / Wheelchair Mobility  Ambulation/Gait Ambulation/Gait assistance: Min assist, +2 physical assistance Gait Distance (Feet): 2 Feet Assistive device: Rolling walker (2 wheeled) Gait Pattern/deviations: Step-to pattern General Gait Details: minA for steadying with stepping to recliner Gait velocity: slowed Gait velocity interpretation: <1.31 ft/sec, indicative of household ambulator    Posture / Balance Dynamic Sitting Balance Sitting balance - Comments: with initial sitting EoB dizziness but able to progress from min A for maintainence of balance to min guard with no UE support as dizziness subsided, sat EoB for 5 minutes  Balance Overall balance assessment: Needs assistance Sitting-balance support: Feet supported, Bilateral upper extremity supported Sitting balance-Leahy Scale: Fair Sitting balance - Comments: with initial sitting EoB dizziness but able to progress from min A for maintainence of balance to min guard with no UE support as dizziness subsided, sat EoB for 5 minutes  Postural control: Posterior lean Standing balance support: Bilateral upper extremity supported, Single extremity supported Standing  balance-Leahy Scale: Fair Standing balance comment: able to progress from minA for steadying to single UE support on RW, stood for 5 minutes    Special needs/care consideration BiPAP/CPAP : no CPM : no Continuous Drip IV : Meropenem Dialysis : no  Days: no Life Vest : no Oxygen : no Special Bed : no Trach Size : no Wound Vac (area) : no      Location: no Skin : abrasion to right arm, ecchymosis to bilateral arm/hand, MASD to right buttocks, rash to sacrum, buttocks, skin tear to right forearm; Pressure injury noted on 10/26 to bilateral sacrum stage II injury; pressure injury on 10/31 to right buttocks stage 1; on 10/09, closed right neck incision; on 10/29, open or dehisced non-pressure wound to left buttocks.        Bowel mgmt: continent, last BM: 11/5 Bladder mgmt: continent, catheter in place Diabetic mgmt: yes Behavioral consideration : no Chemo/radiation : no   Previous Home Environment (from acute therapy documentation) Living Arrangements: Spouse/significant other, Children Available Help at Discharge: Family, Available 24 hours/day Type of Home: House Home Layout: Two level, Bed/bath upstairs Alternate Level Stairs-Rails: Right Alternate Level Stairs-Number of Steps: flight, 16 steps Home Access: Level entry Bathroom Shower/Tub: Tub/shower unit, Industrial/product designer: Yes Home Care Services: No  Discharge Living Setting Plans for Discharge Living Setting: Patient's home, Lives with (comment)(lives with wife and 3 adult children) Type of Home at Discharge: House Discharge Home Layout: Two level, 1/2 bath on main level, Bed/bath upstairs Alternate Level Stairs-Rails: Right Alternate Level Stairs-Number of Steps: 16 Discharge Home Access: Level entry Discharge Bathroom Shower/Tub: Tub/shower unit Discharge Bathroom Toilet: Standard Discharge Bathroom Accessibility: Yes How Accessible: Accessible via walker Does the patient have  any problems obtaining your medications?: No  Social/Family/Support Systems Patient Roles: Spouse, Parent Contact Information: wife Mardene Celeste): [phone] Anticipated Caregiver: wife + 2/3 grown kids work from home and can be present Anticipated Ambulance person Information: see above Ability/Limitations of Caregiver: Min A Caregiver Availability: 24/7 Discharge Plan Discussed with Primary Caregiver: Yes Is Caregiver In Agreement with Plan?: Yes Does Caregiver/Family have Issues with Lodging/Transportation while Pt is in Rehab?: No  Goals/Additional Needs Patient/Family Goal for Rehab: PT/OT: Supervision; SLP: NA Expected length of stay: 7-10 days Cultural Considerations: no Dietary Needs: carb modified, thin liquids; calorie level medium: 1600-2000 Equipment Needs: TBD Pt/Family Agrees to Admission and willing to participate: Yes Program Orientation Provided & Reviewed with Pt/Caregiver Including Roles  & Responsibilities: Yes(reviewed with pt and his wife)  Barriers to Discharge: Home environment access/layout  Barriers to Discharge Comments: bedroom/bathroom on 2nd level (16 steps to enter)  Decrease burden of Care through IP rehab admission: NA  Possible need for SNF placement upon discharge: Not anticipated; pt has great social support at DC and is motivated to return home after CIR stay. Pt has good prognosis for further progress through IP Rehab.   Patient Condition: I have reviewed medical records from [facility], spoken with MD, and patient and spouse. I met with patient at the bedside for inpatient rehabilitation assessment.  Patient will benefit from ongoing PT and OT, can actively participate in 3 hours of therapy a day 5 days of the week, and can make measurable gains during the admission.  Patient will also benefit from the coordinated team approach during an Inpatient Acute Rehabilitation admission.  The patient will receive intensive therapy as well  as Rehabilitation physician, nursing, social worker, and care management interventions.  Due to bladder management, bowel management, safety, skin/wound care, disease management, medication administration, pain management and patient education the patient requires 24 hour a day rehabilitation nursing.  The patient is currently Min A +2 for 2 feet of ambulation (mod A +2 for transfers);  has been limited in most recent therapy session due to abdominal pain. The patient is currently set up to [place] [facility] for basic ADLs.  Discharge setting and therapy post discharge at home with home health is anticipated.  Patient has agreed to participate in the Acute Inpatient Rehabilitation Program and will admit 06/05/2019.  Preadmission Screen Completed By:  Raechel Ache, 06/05/2019 12:53 PM ______________________________________________________________________   Discussed status with Dr. Ranell Patrick on 06/05/2019 at 12:53PM and received approval for admission today.  Admission Coordinator:  Raechel Ache, OT, time 12:53PM/Date 06/05/2019   Assessment/Plan: Diagnosis: 1. Does the need for close, 24 hr/day Medical supervision in concert with the patient's rehab needs make it unreasonable for this patient to be served in a less intensive setting? Yes 2. Co-Morbidities requiring supervision/potential complications: Afib, Type 2 DM, nasopharyngeal cancer, Menetrier disease, PUD/GIB, CAD s/p CABG 3. Due to bladder management, bowel management, safety, skin/wound care, disease management, medication administration, pain management and patient education, does the patient require 24 hr/day rehab nursing? Yes 4. Does the patient require coordinated care of a physician, rehab nurse, PT, OT, and SLP to address physical and functional deficits in the context of the above medical diagnosis(es)? Yes Addressing deficits in the following areas: balance, endurance, locomotion, strength, transferring, bowel/bladder control, bathing, dressing,  feeding, grooming, toileting, cognition, speech, language, swallowing and psychosocial support 5. Can the patient actively participate in an intensive therapy program of at least 3 hrs of therapy 5 days a week? Yes 6. The potential for patient to make measurable gains while on inpatient rehab is fair.  7. Anticipated functional outcomes upon discharge from inpatient rehab: MinA PT, modified independent OT, independent SLP 8. Estimated rehab length of stay to reach the above functional goals is: 2 weeks 9. Anticipated discharge destination: Home 10. Overall Rehab/Functional Prognosis: fair   MD Signature: Leeroy Cha, MD

## 2019-06-03 NOTE — Progress Notes (Signed)
ANTICOAGULATION CONSULT NOTE - Follow Up Consult  Pharmacy Consult for heparin Indication: atrial fibrillation  Labs: Recent Labs    05/31/19 0534 06/01/19 0637 06/02/19 0406 06/03/19 0016  HGB 10.3* 11.7* 10.2*  --   HCT 33.7* 38.5* 33.6*  --   PLT 109* 126* 96*  --   HEPARINUNFRC  --   --   --  0.32  CREATININE 2.70* 2.67* 2.65*  --     Assessment/Plan:  68yo male therapeutic on heparin after resumed. Will continue gtt at current rate and confirm stable with am labs.   Wynona Neat, PharmD, BCPS  06/03/2019,1:06 AM

## 2019-06-03 NOTE — Progress Notes (Signed)
Inpatient Rehabilitation-Admissions Coordinator   Followed up with pt this AM. He is still interested in CIR program. [facility] will continue to follow for medical readiness.   Jhonnie Garner, OTR/L  Rehab Admissions Coordinator  [phone] 06/03/2019 11:35 AM

## 2019-06-03 NOTE — Progress Notes (Signed)
PROGRESS NOTE    Dylan Goodman  MOQ:[id number] DOB: [birth year] DOA: 05/06/2019 PCP: Steele Sizer, MD    Brief Narrative:  68 year old with a history of atrial fibrillation, GIB, CAD status post CABG, nasopharyngeal cancer, CKD, DM, and GERD who was previously treated for Covid pneumonia at [facility] and discharged on 9/19.  On 10/6, returned to the [facility] ED with reported seizure-like behavior, and subsequently suffered a cardiac arrest. He was intubated and transferred to [facility] ICU.  Patient was extubated and subsequently intubated again on 10/21 and transferred to [facility], extubated on 10/24.  Patient was [facility] to [facility] for possible intermittent hemodialysis and CIR evaluation  Assessment & Plan:   Principal Problem:   Acute respiratory failure with hypoxemia ([place]) Active Problems:   GERD (gastroesophageal reflux disease)   Hypomagnesemia   Acute kidney injury superimposed on chronic kidney disease (HCC)   Atrial fibrillation with RVR ([place])   COVID-19 virus infection   Respiratory failure, acute (HCC)   Cardiac arrest (HCC)   Prolonged QT interval   Seizure (HCC)   Acute respiratory disease due to COVID-19 virus   Hypoxia   Pressure injury of skin   Acute pulmonary edema (HCC)  Acute hypoxic respiratory failure/pneumonia due to COVID-19 virus -Patient currently afebrile, with no leukocytosis -Patient has 2+ Covid test on 04/12/2019, and 05/04/2019, repeat test done on 05/30/2019 is negative -Currently saturating above 90% on room air -Patient has completed course of remdesivir and steroids, however on stress dose steroids due to concern for adrenal insufficiency -Continue Solu-Cortef, continue to wean as tolerated Continue inhalers  Hypotension/possible adrenal insufficiency -BP remains stable at this time -Unclear etiology for hypotension, ??  Worsened by CRRT -Remains off pressors -Continued on midodrine per Nephrology and continue  Solu-Cortef with slow taper  Chronic A. fib with RVR -Remains asymptomatic -Overall stable at this time -Echo done on 10/9 showed EF of 45 to 50%, improved from previous on 03/27/2019 which was 35 to 40% -Patient had been followed by cardiology, Cardiology has since signed off -Patient started on heparin gtt. Monitor for bleeding. If stable, then consider resuming therapeutic anticoagulation  ESBL/VRE UTI/possible acute urinary retention -Patient with significant hematuria, dysuria, bladder distention -Currently afebrile with no leukocytosis -UA showed large hemoglobin, many bacteria, 6-10 WBC -UC grew ESBL Klebsiella, VRE -Ultrasound done on 06/01/2019 showed mild left greater than right hydronephrosis, medical renal disease -Was on ceftriaxone empirically, will switch to meropenem, add linezolid -Discussed with urology Dr. Claudia Desanctis on 06/01/2019, recommend continuing treatment for UTI, placing Foley catheter, no further interventions right now due to UTI -Seems stable  Diffuse ST depression noted on EKG -Remains asymptomatic -Cardiology had signed off per above  Acute on chronic systolic HF -Appears euvolemic at this time -Echo showed EF of 45 to 50% on 10/9 -Volume was managed by CRRT, on intermittent HD per Nephrology  AKI -Was on CRRT, nephrology on board -Plan for intermittent HD on hold for now as creatinine is stable, holding off on HD for now per Nephrology  History of carotid artery disease/peripheral vascular disease -Was on Pletal, Brilinta as well as Eliquis -Currently all on hold due to increased risk of bleeding -Now on heparin gtt. Monitor carefully for signs of bleeding  Single episode of seizure -No further episodes -Continue Keppra  Diabetes mellitus type 2 -Last A1c 6.7 -SSI, Lantus, Accu-Cheks, hypoglycemic protocol  GERD -Continue PPI  Deconditioning -PT/OT -CIR recommended  DVT prophylaxis: Heparin gtt Code Status: Full Family  Communication: Pt in room, family at bedside Disposition Plan: Uncertain at this time  Consultants:   PCCM  Nephrology  Urology  Procedures:   Mechanical ventilation as mentioned above  Antimicrobials: Anti-infectives (From admission, onward)   Start     Dose/Rate Route Frequency Ordered Stop   06/02/19 2200  linezolid (ZYVOX) tablet 600 mg     600 mg Oral Every 12 hours 06/02/19 1219 06/05/19 2359   06/01/19 1200  linezolid (ZYVOX) IVPB 600 mg  Status:  Discontinued     600 mg 300 mL/hr over 60 Minutes Intravenous Every 12 hours 06/01/19 1150 06/02/19 1219   05/31/19 1400  meropenem (MERREM) 1 g in sodium chloride 0.9 % 100 mL IVPB     1 g 200 mL/hr over 30 Minutes Intravenous Every 12 hours 05/31/19 1354 06/05/19 0959   05/29/19 1400  cefTRIAXone (ROCEPHIN) 2 g in sodium chloride 0.9 % 100 mL IVPB  Status:  Discontinued     2 g 200 mL/hr over 30 Minutes Intravenous Every 24 hours 05/29/19 1255 05/31/19 1329   05/20/19 2320  ceFEPIme (MAXIPIME) 2 g in sodium chloride 0.9 % 100 mL IVPB  Status:  Discontinued     2 g 200 mL/hr over 30 Minutes Intravenous Every 24 hours 05/20/19 0840 05/21/19 1227   05/20/19 1000  remdesivir 100 mg in sodium chloride 0.9 % 250 mL IVPB     100 mg 500 mL/hr over 30 Minutes Intravenous Every 24 hours 05/19/19 0837 05/23/19 1050   05/19/19 1000  ceFEPIme (MAXIPIME) 2 g in sodium chloride 0.9 % 100 mL IVPB  Status:  Discontinued     2 g 200 mL/hr over 30 Minutes Intravenous Every 12 hours 05/19/19 0837 05/20/19 0840   05/19/19 1000  remdesivir 200 mg in sodium chloride 0.9 % 250 mL IVPB     200 mg 500 mL/hr over 30 Minutes Intravenous Once 05/19/19 0837 05/19/19 1241   05/09/19 1000  piperacillin-tazobactam (ZOSYN) IVPB 2.25 g  Status:  Discontinued     2.25 g 100 mL/hr over 30 Minutes Intravenous Every 8 hours 05/09/19 0833 05/10/19 1346   05/08/19 1000  piperacillin-tazobactam (ZOSYN) IVPB 3.375 g  Status:  Discontinued     3.375 g 12.5  mL/hr over 240 Minutes Intravenous Every 12 hours 05/08/19 0930 05/09/19 0833   05/08/19 0630  piperacillin-tazobactam (ZOSYN) IVPB 3.375 g  Status:  Discontinued     3.375 g 12.5 mL/hr over 240 Minutes Intravenous Every 12 hours 05/07/19 2040 05/08/19 0930   05/07/19 2200  vancomycin (VANCOCIN) 1,750 mg in sodium chloride 0.9 % 500 mL IVPB  Status:  Discontinued     1,750 mg 250 mL/hr over 120 Minutes Intravenous Every 48 hours 05/06/19 1912 05/07/19 1039   05/07/19 2200  vancomycin (VANCOCIN) 1,500 mg in sodium chloride 0.9 % 500 mL IVPB  Status:  Discontinued     1,500 mg 250 mL/hr over 120 Minutes Intravenous Every 48 hours 05/07/19 1039 05/07/19 1228   05/07/19 0200  piperacillin-tazobactam (ZOSYN) IVPB 3.375 g  Status:  Discontinued     3.375 g 12.5 mL/hr over 240 Minutes Intravenous Every 8 hours 05/06/19 1853 05/07/19 2040   05/06/19 1930  piperacillin-tazobactam (ZOSYN) IVPB 3.375 g     3.375 g 100 mL/hr over 30 Minutes Intravenous  Once 05/06/19 1853 05/06/19 2156       Subjective: Complaining of wanting advanced diet with relaxed fluid restriction  Objective: Vitals:   06/03/19 0727 06/03/19 1200 06/03/19 1357 06/03/19 1646  BP: 140/80  133/78 113/73  Pulse: (!) 110  89 (!) 103  Resp:  (!) 28    Temp: 97.6 F (36.4 C)   (!) 97.5 F (36.4 C)  TempSrc: Oral   Oral  SpO2: 91%  92% 90%  Weight:      Height:        Intake/Output Summary (Last 24 hours) at 06/03/2019 1810 Last data filed at 06/03/2019 5102 Gross per 24 hour  Intake 467.98 ml  Output 175 ml  Net 292.98 ml   Filed Weights   05/29/19 0451 05/31/19 0500 06/01/19 0500  Weight: 85.3 kg 87.3 kg 87.7 kg    Examination:  General exam: Appears calm and comfortable  Respiratory system: Clear to auscultation. Respiratory effort normal. Cardiovascular system: S1 & S2 heard, RRR. Gastrointestinal system: Abdomen is nondistended, soft and nontender. No organomegaly or masses felt. Normal bowel sounds heard.  Central nervous system: Alert and oriented. No focal neurological deficits. Extremities: Symmetric 5 x 5 power. Skin: No rashes, lesions  Psychiatry: Judgement and insight appear normal. Mood & affect appropriate.   Data Reviewed: I have personally reviewed following labs and imaging studies  CBC: Recent Labs  Lab 05/30/19 1112 05/31/19 0534 06/01/19 0637 06/02/19 0406 06/03/19 0016  WBC 11.4* 9.2 10.4 7.8 8.0  HGB 10.8* 10.3* 11.7* 10.2* 10.3*  HCT 34.7* 33.7* 38.5* 33.6* 33.5*  MCV 98.3 99.7 100.0 100.6* 99.7  PLT 122* 109* 126* 96* 89*   Basic Metabolic Panel: Recent Labs  Lab 05/28/19 0504  05/30/19 1112 05/31/19 0534 06/01/19 0637 06/02/19 0406 06/03/19 0016  NA 136   < > 137 140 140 140 137  K 3.8   < > 3.8 3.6 3.5 3.8 2.8*  CL 102   < > 105 107 105 105 106  CO2 27   < > 20* 22 21* 21* 21*  GLUCOSE 150*   < > 195* 150* 128* 169* 192*  BUN 20   < > 52* 58* 61* 57* 57*  CREATININE 1.26*   < > 2.67* 2.70* 2.67* 2.65* 2.42*  CALCIUM 8.1*   < > 8.5* 8.4* 8.2* 7.6* 7.0*  MG 2.1  --   --   --   --   --   --   PHOS 2.4*  2.4*  --   --   --   --   --   --    < > = values in this interval not displayed.   GFR: Estimated Creatinine Clearance: 32.4 mL/min (A) (by C-G formula based on SCr of 2.42 mg/dL (H)). Liver Function Tests: Recent Labs  Lab 05/28/19 0504  ALBUMIN 2.4*   No results for input(s): LIPASE, AMYLASE in the last 168 hours. No results for input(s): AMMONIA in the last 168 hours. Coagulation Profile: No results for input(s): INR, PROTIME in the last 168 hours. Cardiac Enzymes: No results for input(s): CKTOTAL, CKMB, CKMBINDEX, TROPONINI in the last 168 hours. BNP (last 3 results) No results for input(s): PROBNP in the last 8760 hours. HbA1C: No results for input(s): HGBA1C in the last 72 hours. CBG: Recent Labs  Lab 06/02/19 1606 06/02/19 2045 06/03/19 0727 06/03/19 1240 06/03/19 1647  GLUCAP 153* 194* 105* 142* 139*   Lipid Profile: No  results for input(s): CHOL, HDL, LDLCALC, TRIG, CHOLHDL, LDLDIRECT in the last 72 hours. Thyroid Function Tests: No results for input(s): TSH, T4TOTAL, FREET4, T3FREE, THYROIDAB in the last 72 hours. Anemia Panel: No results for input(s): VITAMINB12, FOLATE, FERRITIN, TIBC, IRON,  RETICCTPCT in the last 72 hours. Sepsis Labs: No results for input(s): PROCALCITON, LATICACIDVEN in the last 168 hours.  Recent Results (from the past 240 hour(s))  Culture, Urine     Status: Abnormal   Collection Time: 05/29/19  3:14 PM   Specimen: Urine, Random  Result Value Ref Range Status   Specimen Description   Final    URINE, RANDOM Performed at [facility], [address], [place], [place] [address]    Special Requests   Final    NONE Performed at [facility], [place] [address], [place], [place] [address]    Culture (A)  Final    >=100,000 COLONIES/mL KLEBSIELLA PNEUMONIAE Confirmed Extended Spectrum Beta-Lactamase Producer (ESBL).  In bloodstream infections from ESBL organisms, carbapenems are preferred over piperacillin/tazobactam. They are shown to have a lower risk of mortality. >=100,000 COLONIES/mL VANCOMYCIN RESISTANT ENTEROCOCCUS ISOLATED    Report Status 06/01/2019 FINAL  Final   Organism ID, Bacteria KLEBSIELLA PNEUMONIAE (A)  Final   Organism ID, Bacteria VANCOMYCIN RESISTANT ENTEROCOCCUS ISOLATED (A)  Final      Susceptibility   Klebsiella pneumoniae - MIC*    AMPICILLIN >=32 RESISTANT Resistant     CEFAZOLIN >=64 RESISTANT Resistant     CEFTRIAXONE >=64 RESISTANT Resistant     CIPROFLOXACIN <=0.25 SENSITIVE Sensitive     GENTAMICIN <=1 SENSITIVE Sensitive     IMIPENEM <=0.25 SENSITIVE Sensitive     NITROFURANTOIN 64 INTERMEDIATE Intermediate     TRIMETH/SULFA >=320 RESISTANT Resistant     AMPICILLIN/SULBACTAM >=32 RESISTANT Resistant     PIP/TAZO 8 SENSITIVE Sensitive     Extended ESBL POSITIVE Resistant     * >=100,000 COLONIES/mL  KLEBSIELLA PNEUMONIAE   Vancomycin resistant enterococcus isolated - MIC*    AMPICILLIN >=32 RESISTANT Resistant     LEVOFLOXACIN >=8 RESISTANT Resistant     NITROFURANTOIN 64 INTERMEDIATE Intermediate     VANCOMYCIN >=32 RESISTANT Resistant     LINEZOLID 2 SENSITIVE Sensitive     * >=100,000 COLONIES/mL VANCOMYCIN RESISTANT ENTEROCOCCUS ISOLATED  Novel Coronavirus, NAA (Hosp order, Send-out to Ref Lab; TAT 18-24 hrs     Status: None   Collection Time: 05/30/19  6:41 AM   Specimen: Nasopharyngeal Swab; Respiratory  Result Value Ref Range Status   SARS-CoV-2, NAA NOT DETECTED NOT DETECTED Final    Comment: (NOTE) This nucleic acid amplification test was developed and its performance characteristics determined by [employer]. Nucleic acid amplification tests include PCR and TMA. This test has not been FDA cleared or approved. This test has been authorized by FDA under an Emergency Use Authorization (EUA). This test is only authorized for the duration of time the declaration that circumstances exist justifying the authorization of the emergency use of in vitro diagnostic tests for detection of SARS-CoV-2 virus and/or diagnosis of COVID-19 infection under section 564(b)(1) of the Act, 21 U.S.C. 829FAO-1(H) (1), unless the authorization is terminated or revoked sooner. When diagnostic testing is negative, the possibility of a false negative result should be considered in the context of a patient's recent exposures and the presence of clinical signs and symptoms consistent with COVID-19. An individual without symptoms of COVID- 19 and who is not shedding SARS-CoV-2 vi rus would expect to have a negative (not detected) result in this assay. Performed At: [facility] [address] [place], [place] [id number] Rush Farmer MD GE:[id number]    [place]  Final    Comment: Performed at [facility], [address] [place], [place] [address]      Radiology Studies: No results found.  Scheduled Meds: . amiodarone  200 mg Oral BID  . calcitRIOL  0.25 mcg Oral Daily  . Chlorhexidine Gluconate Cloth  6 each Topical Daily  . diltiazem  30 mg Oral Q6H  . feeding supplement (PRO-STAT SUGAR FREE 64)  30 mL Oral BID  . hydrocortisone sodium succinate  50 mg Intravenous Q12H  . insulin aspart  0-5 Units Subcutaneous QHS  . insulin aspart  0-9 Units Subcutaneous TID WC  . insulin glargine  12 Units Subcutaneous QHS  . ipratropium-albuterol  3 mL Inhalation BID  . levETIRAcetam  500 mg Oral BID  . linezolid  600 mg Oral Q12H  . Melatonin  6 mg Oral QHS  . midodrine  5 mg Oral TID WC  . mometasone-formoterol  2 puff Inhalation BID  . pantoprazole  40 mg Oral Daily  . potassium chloride  40 mEq Oral TID   Continuous Infusions: . heparin 1,700 Units/hr (06/03/19 0627)  . meropenem (MERREM) IV 1 g (06/03/19 0950)     LOS: 28 days   Marylu Lund, MD [facility] Pager On Amion  If 7PM-7AM, please contact night-coverage 06/03/2019, 6:10 PM

## 2019-06-04 LAB — HEPARIN LEVEL (UNFRACTIONATED): Heparin Unfractionated: 1.1 IU/mL — ABNORMAL HIGH (ref 0.30–0.70)

## 2019-06-04 LAB — CBC
HCT: 35.1 % — ABNORMAL LOW (ref 39.0–52.0)
Hemoglobin: 10.6 g/dL — ABNORMAL LOW (ref 13.0–17.0)
MCH: 30.5 pg (ref 26.0–34.0)
MCHC: 30.2 g/dL (ref 30.0–36.0)
MCV: 100.9 fL — ABNORMAL HIGH (ref 80.0–100.0)
Platelets: 108 10*3/uL — ABNORMAL LOW (ref 150–400)
RBC: 3.48 MIL/uL — ABNORMAL LOW (ref 4.22–5.81)
RDW: 19.9 % — ABNORMAL HIGH (ref 11.5–15.5)
WBC: 8.1 10*3/uL (ref 4.0–10.5)
nRBC: 0 % (ref 0.0–0.2)

## 2019-06-04 LAB — BASIC METABOLIC PANEL
Anion gap: 11 (ref 5–15)
BUN: 53 mg/dL — ABNORMAL HIGH (ref 8–23)
CO2: 21 mmol/L — ABNORMAL LOW (ref 22–32)
Calcium: 7 mg/dL — ABNORMAL LOW (ref 8.9–10.3)
Chloride: 110 mmol/L (ref 98–111)
Creatinine, Ser: 2.28 mg/dL — ABNORMAL HIGH (ref 0.61–1.24)
GFR calc Af Amer: 34 mL/min — ABNORMAL LOW (ref >60)
GFR calc non Af Amer: 29 mL/min — ABNORMAL LOW (ref >60)
Glucose, Bld: 134 mg/dL — ABNORMAL HIGH (ref 70–99)
Potassium: 3.9 mmol/L (ref 3.5–5.1)
Sodium: 142 mmol/L (ref 135–145)

## 2019-06-04 LAB — URINALYSIS, ROUTINE W REFLEX MICROSCOPIC
Bilirubin Urine: NEGATIVE
Glucose, UA: 50 mg/dL — AB
Ketones, ur: NEGATIVE mg/dL
Nitrite: NEGATIVE
Protein, ur: 30 mg/dL — AB
Specific Gravity, Urine: 1.013 (ref 1.005–1.030)
WBC, UA: >50 WBC/hpf — ABNORMAL HIGH (ref 0–5)
pH: 5 (ref 5.0–8.0)

## 2019-06-04 LAB — GLUCOSE, CAPILLARY
Glucose-Capillary: 124 mg/dL — ABNORMAL HIGH (ref 70–99)
Glucose-Capillary: 145 mg/dL — ABNORMAL HIGH (ref 70–99)
Glucose-Capillary: 95 mg/dL (ref 70–99)
Glucose-Capillary: 147 mg/dL — ABNORMAL HIGH (ref 70–99)

## 2019-06-04 LAB — PHOSPHORUS: Phosphorus: 3.3 mg/dL (ref 2.5–4.6)

## 2019-06-04 MED ORDER — DILTIAZEM HCL ER COATED BEADS 240 MG PO CP24
240.0000 mg | ORAL_CAPSULE | Freq: Every day | ORAL | Status: DC
  Administered 2019-06-04 – 2019-06-05 (×2): 240 mg via ORAL
  Filled 2019-06-04 (×2): qty 1

## 2019-06-04 MED ORDER — SODIUM CHLORIDE 0.9 % IV SOLN
INTRAVENOUS | Status: AC
  Administered 2019-06-04: 11:00:00 via INTRAVENOUS

## 2019-06-04 MED ORDER — BACID PO TABS
2.0000 | ORAL_TABLET | Freq: Three times a day (TID) | ORAL | Status: DC
  Administered 2019-06-04 – 2019-06-05 (×4): 2 via ORAL
  Filled 2019-06-04 (×6): qty 2

## 2019-06-04 MED ORDER — APIXABAN 5 MG PO TABS
5.0000 mg | ORAL_TABLET | Freq: Two times a day (BID) | ORAL | Status: DC
  Administered 2019-06-04 – 2019-06-05 (×3): 5 mg via ORAL
  Filled 2019-06-04 (×3): qty 1

## 2019-06-04 NOTE — Progress Notes (Signed)
ANTICOAGULATION CONSULT NOTE Pharmacy Consult for Heparin Indication: atrial fibrillation   Patient Measurements: Height: 5\' 11"  (180.3 cm) Weight: 193 lb 6.4 oz (87.7 kg) IBW/kg (Calculated) : 75.3 Heparin Dosing Weight: 86.7 kg  Vital Signs: Temp: 98.4 F (36.9 C) (11/04 2255) BP: 102/60 (11/05 0541) Pulse Rate: 98 (11/04 2255)  Labs: Recent Labs    06/02/19 0406 06/03/19 0016 06/03/19 0758 06/04/19 0337  HGB 10.2* 10.3*  --  10.6*  HCT 33.6* 33.5*  --  35.1*  PLT 96* 89*  --  108*  HEPARINUNFRC  --  0.32 0.59 1.10*  CREATININE 2.65* 2.42*  --  2.28*   Assessment: 68 y.o. male with Afib for heparin  Goal of Therapy:  Heparin level 0.3-0.7 units/ml aPTT 66-102 seconds Monitor platelets by anticoagulation protocol: Yes    Plan:  Decrease Heparin 1450 units/hr Check heparin level in 8 hours.  Caryl Pina 06/04/2019 6:11 AM

## 2019-06-04 NOTE — Progress Notes (Addendum)
Occupational Therapy Treatment Patient Details Name: Dylan Goodman MRN: [id number] DOB: 12/28/1950 Today's Date: 06/04/2019    History of present illness 68 y/o male treated for COVID pneumonia on September 13 returned to the ED on 10/7 with seizure and cardiac arrest.  Transitioned to floor where he had AKI which resolved but then later in his admission developed worsening respiratory failure and renal failure again.  Ultimately re-intubated on 10/21 and sent back to [facility]. Extubated 10/24. Off CRRT. Now back to [facility] with UTI and hematuria and distended abdomen with pain   OT comments  Pt seen for f/u OT session, focus on therapeutic exercise and self care. Pt continues to have extreme pain associated with UTI symptoms despite foley being placed. RN called in room for bladder scan and to flush foley, also paging MD for f/u. Pt was able to tolerate bed/chair position for UE/LE exercise. Truncal exercises facilitated with self care. Goals updated to reflect progress. Pt continues to be motivated to engage in therapy. Remains excellent CIR candidate. Will continue to follow while acute.   Follow Up Recommendations  CIR    Equipment Recommendations  None recommended by OT    Recommendations for Other Services      Precautions / Restrictions Precautions Precautions: Fall Precaution Comments: abdomen pain from UTI Restrictions Weight Bearing Restrictions: No       Mobility Bed Mobility Overal bed mobility: Needs Assistance   Rolling: Min assist         General bed mobility comments: pt in bed/chair position for exercise 2/2 abdomen pain  Transfers                      Balance                                           ADL either performed or assessed with clinical judgement   ADL Overall ADL's : Needs assistance/impaired Eating/Feeding: Set up;Sitting   Grooming: Set up;Sitting                                 General ADL  Comments: mostly focused on exercise to improve BADL tolerance, cont to be limited by UTI symptoms     Vision Patient Visual Report: No change from baseline     Perception     Praxis      Cognition Arousal/Alertness: Awake/alert Behavior During Therapy: WFL for tasks assessed/performed Overall Cognitive Status: Within Functional Limits for tasks assessed                                          Exercises     Shoulder Instructions       General Comments      Pertinent Vitals/ Pain       Pain Assessment: Faces Faces Pain Scale: Hurts whole lot Pain Location: penis/bladder Pain Descriptors / Indicators: Discomfort;Grimacing;Moaning;Sharp Pain Intervention(s): Limited activity within patient's tolerance;Monitored during session;Other (comment)(RN called in to bladder scan)  Home Living  Prior Functioning/Environment              Frequency  Min 2X/week        Progress Toward Goals  OT Goals(current goals can now be found in the care plan section)  Progress towards OT goals: Progressing toward goals  Acute Rehab OT Goals Patient Stated Goal: get stronger, go to rehab OT Goal Formulation: With patient Time For Goal Achievement: 06/18/19 Potential to Achieve Goals: Good  Plan Discharge plan remains appropriate;Frequency remains appropriate    Co-evaluation    PT/OT/SLP Co-Evaluation/Treatment: Yes Reason for Co-Treatment: For patient/therapist safety;To address functional/ADL transfers PT goals addressed during session: Mobility/safety with mobility OT goals addressed during session: ADL's and self-care      AM-PAC OT "6 Clicks" Daily Activity     Outcome Measure   Help from another person eating meals?: A Little Help from another person taking care of personal grooming?: A Little Help from another person toileting, which includes using toliet, bedpan, or urinal?: A Lot Help  from another person bathing (including washing, rinsing, drying)?: A Lot Help from another person to put on and taking off regular upper body clothing?: A Little Help from another person to put on and taking off regular lower body clothing?: A Lot 6 Click Score: 15    End of Session    OT Visit Diagnosis: Unsteadiness on feet (R26.81);Muscle weakness (generalized) (M62.81);Pain Pain - part of body: (penis/abdomen)   Activity Tolerance Patient tolerated treatment well   Patient Left in bed;with call bell/phone within reach;with bed alarm set   Nurse Communication Mobility status;Patient requests pain meds        Time: 1410-1448 OT Time Calculation (min): 38 min  Charges: OT General Charges $OT Visit: 1 Visit OT Treatments $Self Care/Home Management : 8-22 mins  Zenovia Jarred, MSOT, OTR/L Behavioral Health OT/ Acute Relief OT [facility] Office: [phone]    Zenovia Jarred 06/04/2019, 3:51 PM

## 2019-06-04 NOTE — Progress Notes (Signed)
[place] for Apixaban Indication: atrial fibrillation   Patient Measurements: Height: 5\' 11"  (180.3 cm) Weight: 193 lb 6.4 oz (87.7 kg) IBW/kg (Calculated) : 75.3  Vital Signs: Temp: 97.5 F (36.4 C) (11/05 0751) Temp Source: Oral (11/05 0751) BP: 123/75 (11/05 0751) Pulse Rate: 98 (11/05 0751)  Labs: Recent Labs    06/02/19 0406 06/03/19 0016 06/03/19 0758 06/04/19 0337  HGB 10.2* 10.3*  --  10.6*  HCT 33.6* 33.5*  --  35.1*  PLT 96* 89*  --  108*  HEPARINUNFRC  --  0.32 0.59 1.10*  CREATININE 2.65* 2.42*  --  2.28*     Assessment: 68 yo male previously hospitalized for COVID pneumonia 9/14-9/19 then returned for seizure-like activity and subsequent cardiac arrest 10/7.  On chronic apixaban prior to admission for atrial fibrillation. Transitioned to IV Heparin at admission. On 10/30 heparin was held due to hematuria. Hematuria resolved and heparin was resumed 11/3 PM. Pharmacy has been consulted to now transition back to Apixaban.  Hemoglobin is low but stable.  Hematuria has resolved.  Platelets are improving at 108.  SCr is trending down at 2.28. CrCl ~ 34 mL/min.    Goal of Therapy:  Monitor platelets by anticoagulation protocol: Yes    Plan:  Resume Apixaban 5 mg po BID - dose appropriate as age < 47 years AND wt > 60 kg, therefore no adjustment needed despite SCr >1.5.   Discontinue Heparin after 1st dose of Apixaban has been given.  Monitor CBC and renal function.  Watch for return of hematuria. Monitor thrombocytopenia.  Sloan Leiter, PharmD, BCPS, BCCCP Clinical Pharmacist Please refer to [facility] for [facility] numbers 06/04/2019 12:57 PM

## 2019-06-04 NOTE — Progress Notes (Signed)
PROGRESS NOTE    Dylan Goodman  BWG:[id number] DOB: [birth year] DOA: 05/06/2019 PCP: Steele Sizer, MD    Brief Narrative:  68 year old with a history of atrial fibrillation, GIB, CAD status post CABG, nasopharyngeal cancer, CKD, DM, and GERD who was previously treated for Covid pneumonia at [facility] and discharged on 9/19.  On 10/6, returned to the [facility] ED with reported seizure-like behavior, and subsequently suffered a cardiac arrest. He was intubated and transferred to [facility] ICU.  Patient was extubated and subsequently intubated again on 10/21 and transferred to [facility], extubated on 10/24.  Patient was transferred to [facility] to [facility] for possible intermittent hemodialysis and CIR evaluation.  Since admission patient has been initiated on intermittent hemodialysis.  CIR has accepted the patient. Patient remains on heparin drip anticipating will be switched   ------------------------------------------------------------------------------------------------------------------------------------------- Assessment & Plan:   Principal Problem:   Acute respiratory failure with hypoxemia (HCC) Active Problems:   GERD (gastroesophageal reflux disease)   Hypomagnesemia   Acute kidney injury superimposed on chronic kidney disease (HCC)   Atrial fibrillation with RVR ([place])   COVID-19 virus infection   Respiratory failure, acute (HCC)   Cardiac arrest (HCC)   Prolonged QT interval   Seizure (HCC)   Acute respiratory disease due to COVID-19 virus   Hypoxia   Pressure injury of skin   Acute pulmonary edema (HCC)  Acute hypoxic respiratory failure/pneumonia due to COVID-19 virus -Patient remained stable, currently afebrile, normotensive on room air -Much improved from respiratory stress  -Patient has 2+ Covid test on 04/12/2019, and 05/04/2019, repeat test done on 05/30/2019 is negative -Currently saturating above 90% on room air -Patient has completed course of  remdesivir and steroids, however on stress dose steroids due to concern for adrenal insufficiency -Continue Solu-Cortef, continue to wean as tolerated Continue inhalers  Hypotension/possible adrenal insufficiency -Blood pressure continue to stabilize -Unclear etiology for hypotension, ??  Worsened by CRRT -Remains off pressors -Continued on midodrine per Nephrology and continue Solu-Cortef with slow taper  Chronic A. fib with RVR -Remains asymptomatic -Overall stable at this time -Echo done on 10/9 showed EF of 45 to 50%, improved from previous on 03/27/2019 which was 35 to 40% -Patient had been followed by cardiology, Cardiology has since signed off -Patient started on heparin gtt. we have monitor for bleeding, hematuria has improved he has been now stabilized, will resume back his oral anticoagulation   ESBL/VRE UTI/possible acute urinary retention -Hematuria has improved -On admission he has had significant hematuria, dysuria, bladder distention -Currently afebrile with no leukocytosis -UA showed large hemoglobin, many bacteria, 6-10 WBC -UC grew ESBL Klebsiella, VRE -Ultrasound done on 06/01/2019 showed mild left greater than right hydronephrosis, medical renal disease -Was on ceftriaxone empirically, will switch to meropenem, add linezolid -Discussed with urology Dr. Claudia Desanctis on 06/01/2019, recommend continuing treatment for UTI, placing Foley catheter, no further interventions right now due to UTI -Seems stable  Diffuse ST depression noted on EKG -Remains asymptomatic -Cardiology had signed off per above  Acute on chronic systolic HF -Appears euvolemic at this time -Echo showed EF of 45 to 50% on 10/9 -Volume was managed by CRRT, on intermittent HD per Nephrology  AKI -Was on CRRT, nephrology on board -Plan for intermittent HD on hold for now as creatinine is stable, holding off on HD for now per Nephrology  History of carotid artery disease/peripheral vascular disease  -Was on Pletal, Brilinta as well as Eliquis -All on hold due to increased risk of bleeding --  no further bleeding or complication -currently on heparin drip which will be DC'd and switched to Eliquis and aspirin  Single episode of seizure -No further episodes -Continue Keppra  Diabetes mellitus type 2 -Last A1c 6.7 -SSI, Lantus, Accu-Cheks, hypoglycemic protocol  GERD -Continue PPI  Deconditioning -PT/OT -CIR recommended  DVT prophylaxis: Heparin gtt Code Status: Full Family Communication: Pt in room, family at bedside Disposition Plan: Uncertain at this time  Consultants:   PCCM  Nephrology  Urology  Procedures:   Mechanical ventilation as mentioned above  Antimicrobials: Anti-infectives (From admission, onward)   Start     Dose/Rate Route Frequency Ordered Stop   06/02/19 2200  linezolid (ZYVOX) tablet 600 mg     600 mg Oral Every 12 hours 06/02/19 1219 06/05/19 2359   06/01/19 1200  linezolid (ZYVOX) IVPB 600 mg  Status:  Discontinued     600 mg 300 mL/hr over 60 Minutes Intravenous Every 12 hours 06/01/19 1150 06/02/19 1219   05/31/19 1400  meropenem (MERREM) 1 g in sodium chloride 0.9 % 100 mL IVPB     1 g 200 mL/hr over 30 Minutes Intravenous Every 12 hours 05/31/19 1354 06/05/19 0959   05/29/19 1400  cefTRIAXone (ROCEPHIN) 2 g in sodium chloride 0.9 % 100 mL IVPB  Status:  Discontinued     2 g 200 mL/hr over 30 Minutes Intravenous Every 24 hours 05/29/19 1255 05/31/19 1329   05/20/19 2320  ceFEPIme (MAXIPIME) 2 g in sodium chloride 0.9 % 100 mL IVPB  Status:  Discontinued     2 g 200 mL/hr over 30 Minutes Intravenous Every 24 hours 05/20/19 0840 05/21/19 1227   05/20/19 1000  remdesivir 100 mg in sodium chloride 0.9 % 250 mL IVPB     100 mg 500 mL/hr over 30 Minutes Intravenous Every 24 hours 05/19/19 0837 05/23/19 1050   05/19/19 1000  ceFEPIme (MAXIPIME) 2 g in sodium chloride 0.9 % 100 mL IVPB  Status:  Discontinued     2 g 200 mL/hr over 30  Minutes Intravenous Every 12 hours 05/19/19 0837 05/20/19 0840   05/19/19 1000  remdesivir 200 mg in sodium chloride 0.9 % 250 mL IVPB     200 mg 500 mL/hr over 30 Minutes Intravenous Once 05/19/19 0837 05/19/19 1241   05/09/19 1000  piperacillin-tazobactam (ZOSYN) IVPB 2.25 g  Status:  Discontinued     2.25 g 100 mL/hr over 30 Minutes Intravenous Every 8 hours 05/09/19 0833 05/10/19 1346   05/08/19 1000  piperacillin-tazobactam (ZOSYN) IVPB 3.375 g  Status:  Discontinued     3.375 g 12.5 mL/hr over 240 Minutes Intravenous Every 12 hours 05/08/19 0930 05/09/19 0833   05/08/19 0630  piperacillin-tazobactam (ZOSYN) IVPB 3.375 g  Status:  Discontinued     3.375 g 12.5 mL/hr over 240 Minutes Intravenous Every 12 hours 05/07/19 2040 05/08/19 0930   05/07/19 2200  vancomycin (VANCOCIN) 1,750 mg in sodium chloride 0.9 % 500 mL IVPB  Status:  Discontinued     1,750 mg 250 mL/hr over 120 Minutes Intravenous Every 48 hours 05/06/19 1912 05/07/19 1039   05/07/19 2200  vancomycin (VANCOCIN) 1,500 mg in sodium chloride 0.9 % 500 mL IVPB  Status:  Discontinued     1,500 mg 250 mL/hr over 120 Minutes Intravenous Every 48 hours 05/07/19 1039 05/07/19 1228   05/07/19 0200  piperacillin-tazobactam (ZOSYN) IVPB 3.375 g  Status:  Discontinued     3.375 g 12.5 mL/hr over 240 Minutes Intravenous Every 8 hours 05/06/19  1853 05/07/19 2040   05/06/19 1930  piperacillin-tazobactam (ZOSYN) IVPB 3.375 g     3.375 g 100 mL/hr over 30 Minutes Intravenous  Once 05/06/19 1853 05/06/19 2156      Subjective: Complaining of wanting advanced diet with relaxed fluid restriction  Objective: Vitals:   06/04/19 0541 06/04/19 0744 06/04/19 0745 06/04/19 0751  BP: 102/60   123/75  Pulse:   92 98  Resp:   (!) 22 16  Temp:    (!) 97.5 F (36.4 C)  TempSrc:    Oral  SpO2:  94%  91%  Weight:      Height:        Intake/Output Summary (Last 24 hours) at 06/04/2019 1227 Last data filed at 06/04/2019 1117 Gross per 24  hour  Intake 1359.23 ml  Output 400 ml  Net 959.23 ml   Filed Weights   05/29/19 0451 05/31/19 0500 06/01/19 0500  Weight: 85.3 kg 87.3 kg 87.7 kg    Examination:    Physical Exam  Constitution:  Alert, cooperative, no distress,  Psychiatric: Normal and stable mood and affect, cognition intact,   HEENT: Normocephalic, PERRL, otherwise with in Normal limits  Chest:Chest symmetric Cardio vascular:  S1/S2, RRR, No murmure, No Rubs or Gallops  pulmonary: Clear to auscultation bilaterally, respirations unlabored, negative wheezes / crackles Abdomen: Soft, non-tender, non-distended, bowel sounds,no masses, no organomegaly Muscular skeletal: Limited exam - in bed, able to move all 4 extremities, Normal strength,  Neuro: CNII-XII intact. , normal motor and sensation, reflexes intact  Extremities: No pitting edema lower extremities, +2 pulses  Skin: Dry, warm to touch, negative for any Rashes, No open wounds Wounds: per nursing documentation IJ in the neck noted      Data Reviewed: I have personally reviewed following labs and imaging studies  CBC: Recent Labs  Lab 05/31/19 0534 06/01/19 0637 06/02/19 0406 06/03/19 0016 06/04/19 0337  WBC 9.2 10.4 7.8 8.0 8.1  HGB 10.3* 11.7* 10.2* 10.3* 10.6*  HCT 33.7* 38.5* 33.6* 33.5* 35.1*  MCV 99.7 100.0 100.6* 99.7 100.9*  PLT 109* 126* 96* 89* 132*   Basic Metabolic Panel: Recent Labs  Lab 05/31/19 0534 06/01/19 0637 06/02/19 0406 06/03/19 0016 06/04/19 0337  NA 140 140 140 137 142  K 3.6 3.5 3.8 2.8* 3.9  CL 107 105 105 106 110  CO2 22 21* 21* 21* 21*  GLUCOSE 150* 128* 169* 192* 134*  BUN 58* 61* 57* 57* 53*  CREATININE 2.70* 2.67* 2.65* 2.42* 2.28*  CALCIUM 8.4* 8.2* 7.6* 7.0* 7.0*  PHOS  --   --   --   --  3.3  CBG: Recent Labs  Lab 06/03/19 1240 06/03/19 1647 06/03/19 2101 06/04/19 0745 06/04/19 1113  GLUCAP 142* 139* 136* 124* 145*     Recent Results (from the past 240 hour(s))  Culture, Urine      Status: Abnormal   Collection Time: 05/29/19  3:14 PM   Specimen: Urine, Random  Result Value Ref Range Status   Specimen Description   Final    URINE, RANDOM Performed at [facility], [address], [place], [place] [address]    Special Requests   Final    NONE Performed at [facility], [place] [address] [place], [place] [address]    Culture (A)  Final    >=100,000 COLONIES/mL KLEBSIELLA PNEUMONIAE Confirmed Extended Spectrum Beta-Lactamase Producer (ESBL).  In bloodstream infections from ESBL organisms, carbapenems are preferred over piperacillin/tazobactam. They are shown  to have a lower risk of mortality. >=100,000 COLONIES/mL VANCOMYCIN RESISTANT ENTEROCOCCUS ISOLATED    Report Status 06/01/2019 FINAL  Final   Organism ID, Bacteria KLEBSIELLA PNEUMONIAE (A)  Final   Organism ID, Bacteria VANCOMYCIN RESISTANT ENTEROCOCCUS ISOLATED (A)  Final      Susceptibility   Klebsiella pneumoniae - MIC*    AMPICILLIN >=32 RESISTANT Resistant     CEFAZOLIN >=64 RESISTANT Resistant     CEFTRIAXONE >=64 RESISTANT Resistant     CIPROFLOXACIN <=0.25 SENSITIVE Sensitive     GENTAMICIN <=1 SENSITIVE Sensitive     IMIPENEM <=0.25 SENSITIVE Sensitive     NITROFURANTOIN 64 INTERMEDIATE Intermediate     TRIMETH/SULFA >=320 RESISTANT Resistant     AMPICILLIN/SULBACTAM >=32 RESISTANT Resistant     PIP/TAZO 8 SENSITIVE Sensitive     Extended ESBL POSITIVE Resistant     * >=100,000 COLONIES/mL KLEBSIELLA PNEUMONIAE   Vancomycin resistant enterococcus isolated - MIC*    AMPICILLIN >=32 RESISTANT Resistant     LEVOFLOXACIN >=8 RESISTANT Resistant     NITROFURANTOIN 64 INTERMEDIATE Intermediate     VANCOMYCIN >=32 RESISTANT Resistant     LINEZOLID 2 SENSITIVE Sensitive     * >=100,000 COLONIES/mL VANCOMYCIN RESISTANT ENTEROCOCCUS ISOLATED  Novel Coronavirus, NAA (Hosp order, Send-out to Ref Lab; TAT 18-24 hrs     Status: None   Collection Time: 05/30/19   6:41 AM   Specimen: Nasopharyngeal Swab; Respiratory  Result Value Ref Range Status   SARS-CoV-2, NAA NOT DETECTED NOT DETECTED Final    Comment: (NOTE) This nucleic acid amplification test was developed and its performance characteristics determined by [employer]. Nucleic acid amplification tests include PCR and TMA. This test has not been FDA cleared or approved. This test has been authorized by FDA under an Emergency Use Authorization (EUA). This test is only authorized for the duration of time the declaration that circumstances exist justifying the authorization of the emergency use of in vitro diagnostic tests for detection of SARS-CoV-2 virus and/or diagnosis of COVID-19 infection under section 564(b)(1) of the Act, 21 U.S.C. 009FGH-8(E) (1), unless the authorization is terminated or revoked sooner. When diagnostic testing is negative, the possibility of a false negative result should be considered in the context of a patient's recent exposures and the presence of clinical signs and symptoms consistent with COVID-19. An individual without symptoms of COVID- 19 and who is not shedding SARS-CoV-2 vi rus would expect to have a negative (not detected) result in this assay. Performed At: [facility] [address] [place], [place] [id number] Rush Farmer MD EL:[id number]    [place]  Final    Comment: Performed at [facility], [place] [address] [place], [place] [address]     Radiology Studies: No results found.  Scheduled Meds: . amiodarone  200 mg Oral BID  . calcitRIOL  0.25 mcg Oral Daily  . Chlorhexidine Gluconate Cloth  6 each Topical Daily  . diltiazem  30 mg Oral Q6H  . feeding supplement (PRO-STAT SUGAR FREE 64)  30 mL Oral BID  . hydrocortisone sodium succinate  50 mg Intravenous Q12H  . insulin aspart  0-5 Units Subcutaneous QHS  . insulin aspart  0-9 Units Subcutaneous TID WC  . insulin glargine  12 Units  Subcutaneous QHS  . ipratropium-albuterol  3 mL Inhalation BID  . lactobacillus acidophilus  2 tablet Oral TID  . levETIRAcetam  500 mg Oral BID  . linezolid  600 mg Oral Q12H  . Melatonin  6  mg Oral QHS  . midodrine  5 mg Oral TID WC  . mometasone-formoterol  2 puff Inhalation BID  . pantoprazole  40 mg Oral Daily   Continuous Infusions: . sodium chloride 75 mL/hr at 06/04/19 1103  . heparin 1,450 Units/hr (06/04/19 2800)  . meropenem (MERREM) IV 1 g (06/03/19 2132)     LOS: 29 days   SIGNED: Deatra Steffen, MD, FACP, FHM. [facility],  Pager [phone]  If 7PM-7AM, please contact night-coverage [email or web address] 06/04/2019, 12:27 PM

## 2019-06-04 NOTE — Progress Notes (Signed)
Physical Therapy Treatment Patient Details Name: Dylan Goodman MRN: [id number] DOB: 1951-02-22 Today's Date: 06/04/2019    History of Present Illness 68 y/o male treated for COVID pneumonia on September 13 returned to the ED on 10/7 with seizure and cardiac arrest.  Transitioned to floor where he had AKI which resolved but then later in his admission developed worsening respiratory failure and renal failure again.  Ultimately re-intubated on 10/21 and sent back to [facility]. Extubated 10/24. Off CRRT. Now back to [facility] with UTI and hematuria and distended abdomen with pain    PT Comments    On entry pt reports increased pain in penis with bouts of cramping pain. While preparing for mobility pt experienced cramping pain, with pt calling out. RN notified and she scanned bladder and flushed foley however pt with continued pain. RN to contact MD. Due to increased pain with mobility, placed bed in chair position for therapeutic exercise and self care. Pt continues to be motivate to participate despite increased pain and is looking forward to CIR admission. PT will continue to follow acutely until d/c.   Follow Up Recommendations  CIR     Equipment Recommendations  Other (comment)(TBD at next venue)    Recommendations for Other Services Rehab consult     Precautions / Restrictions Precautions Precautions: Fall Precaution Comments: abdomen pain from UTI Restrictions Weight Bearing Restrictions: No    Mobility  Bed Mobility Overal bed mobility: Needs Assistance   Rolling: Min assist         General bed mobility comments: pt in bed/chair position for exercise 2/2 abdomen pain           Cognition Arousal/Alertness: Awake/alert Behavior During Therapy: WFL for tasks assessed/performed Overall Cognitive Status: Within Functional Limits for tasks assessed                                        Exercises General Exercises - Lower Extremity Ankle Circles/Pumps:  AROM;Both;10 reps;Seated Long Arc Quad: AROM;Both;10 reps;Seated Hip Flexion/Marching: AROM;Both;10 reps;Seated Other Exercises Other Exercises: truncal rotation x5 each side Other Exercises: pulling into sitting without back support x5, once able to maintain upright sitting while simulateously combing hair    General Comments General comments (skin integrity, edema, etc.): HR in 130s with pain during cramping episodes       Pertinent Vitals/Pain Pain Assessment: Faces Faces Pain Scale: Hurts whole lot Pain Location: penis/bladder Pain Descriptors / Indicators: Discomfort;Grimacing;Moaning;Sharp;Contraction;Cramping Pain Intervention(s): Limited activity within patient's tolerance;Monitored during session;Repositioned;Patient requesting pain meds-RN notified;Other (comment)(RN called in to bladder scan)           PT Goals (current goals can now be found in the care plan section) Acute Rehab PT Goals Patient Stated Goal: get stronger, go to rehab PT Goal Formulation: With patient Time For Goal Achievement: 06/08/19 Potential to Achieve Goals: Fair Progress towards PT goals: Progressing toward goals    Frequency    Min 4X/week      PT Plan Current plan remains appropriate;Frequency needs to be updated    Co-evaluation PT/OT/SLP Co-Evaluation/Treatment: Yes Reason for Co-Treatment: For patient/therapist safety;Complexity of the patient's impairments (multi-system involvement) PT goals addressed during session: Mobility/safety with mobility OT goals addressed during session: ADL's and self-care      AM-PAC PT "6 Clicks" Mobility   Outcome Measure  Help needed turning from your back to your side while in a flat bed without  using bedrails?: A Little Help needed moving from lying on your back to sitting on the side of a flat bed without using bedrails?: A Little Help needed moving to and from a bed to a chair (including a wheelchair)?: Total Help needed standing up from  a chair using your arms (e.g., wheelchair or bedside chair)?: Total Help needed to walk in hospital room?: Total Help needed climbing 3-5 steps with a railing? : Total 6 Click Score: 10    End of Session Equipment Utilized During Treatment: Gait belt Activity Tolerance: Patient tolerated treatment well;No increased pain Patient left: in bed;with call bell/phone within reach;with bed alarm set;with family/visitor present Nurse Communication: Mobility status PT Visit Diagnosis: Unsteadiness on feet (R26.81);Other abnormalities of gait and mobility (R26.89);Muscle weakness (generalized) (M62.81);Difficulty in walking, not elsewhere classified (R26.2);Pain Pain - part of body: (bladder/penis)     Time: 0315-9458 PT Time Calculation (min) (ACUTE ONLY): 38 min  Charges:  $Therapeutic Exercise: 8-22 mins $Therapeutic Activity: 8-22 mins                     Jerlisa Diliberto B. Migdalia Dk PT, DPT Acute Rehabilitation Services Pager [phone] Office [phone]    [place] 06/04/2019, 5:26 PM

## 2019-06-04 NOTE — Progress Notes (Signed)
[facility] NEPHROLOGY PROGRESS NOTE  Assessment/ Plan: Pt is a 68 y.o. yo male male with CHF, A. fib, DM, CKD with baseline creatinine around 2 admitted to [facility] from 9/14-9/19, readmitted on 10/6 status post cardiac arrest and suffered AKI on CKD required CRRT till 10/29.  #AKI on CKD, baseline creatinine around 2.  AKI likely ATN after cardiac arrest and concomitant with COVID-19 infection.  Initially treated with Lasix and then started CRRT from 10/24-10/29. -Urine output is marginal however serum creatinine level is trending down to 2.2 today.  Volume status looks acceptable and he has no uremic feature.  No need for dialysis.  I will do trial of IV fluid for 12 hours.  Needed strict ins and out.  Hopefully we can avoid dialysis.  For now we will keep HD catheter.  #Hypokalemia: Repleted potassium chloride.  Monitor lab.  Encourage oral intake.  #Acute hypoxic respiratory failure/pneumonia due to COVID-19 virus: Completed course of remdesivir and steroid.  Clinically improving.  In room air.  #Hypotension/possible adrenal insufficiency: On Solu-Cortef tapering and midodrine.  Lower midodrine dose to 5 mg 3 times daily.  Blood pressure is better controlled.  #ESBL UTI possible acute urinary retention: Meropenem since 11/1.  Urology following for some blood clot during urination.  #Acute on chronic systolic CHF: LVEF 40 to 26%.  Subjective: Seen and examined at bedside.  Able to lie flat comfortable.  No shortness of breath, cough, nausea or vomiting.  Urine output is only 400 cc.    Objective Vital signs in last 24 hours: Vitals:   06/04/19 0541 06/04/19 0744 06/04/19 0745 06/04/19 0751  BP: 102/60   123/75  Pulse:   92 98  Resp:   (!) 22 16  Temp:    (!) 97.5 F (36.4 C)  TempSrc:    Oral  SpO2:  94%  91%  Weight:      Height:       Weight change:   Intake/Output Summary (Last 24 hours) at 06/04/2019 0912 Last data filed at 06/04/2019  0651 Gross per 24 hour  Intake 1141.86 ml  Output 400 ml  Net 741.86 ml       Labs: Basic Metabolic Panel: Recent Labs  Lab 06/02/19 0406 06/03/19 0016 06/04/19 0337  NA 140 137 142  K 3.8 2.8* 3.9  CL 105 106 110  CO2 21* 21* 21*  GLUCOSE 169* 192* 134*  BUN 57* 57* 53*  CREATININE 2.65* 2.42* 2.28*  CALCIUM 7.6* 7.0* 7.0*  PHOS  --   --  3.3   Liver Function Tests: No results for input(s): AST, ALT, ALKPHOS, BILITOT, PROT, ALBUMIN in the last 168 hours. No results for input(s): LIPASE, AMYLASE in the last 168 hours. No results for input(s): AMMONIA in the last 168 hours. CBC: Recent Labs  Lab 05/31/19 0534 06/01/19 0637 06/02/19 0406 06/03/19 0016 06/04/19 0337  WBC 9.2 10.4 7.8 8.0 8.1  HGB 10.3* 11.7* 10.2* 10.3* 10.6*  HCT 33.7* 38.5* 33.6* 33.5* 35.1*  MCV 99.7 100.0 100.6* 99.7 100.9*  PLT 109* 126* 96* 89* 108*   Cardiac Enzymes: No results for input(s): CKTOTAL, CKMB, CKMBINDEX, TROPONINI in the last 168 hours. CBG: Recent Labs  Lab 06/03/19 0727 06/03/19 1240 06/03/19 1647 06/03/19 2101 06/04/19 0745  GLUCAP 105* 142* 139* 136* 124*    Iron Studies: No results for input(s): IRON, TIBC, TRANSFERRIN, FERRITIN in the last 72 hours. Studies/Results: No results found.  Medications: Infusions: . heparin 1,450 Units/hr (06/04/19  6004)  . meropenem (MERREM) IV 1 g (06/03/19 2132)    Scheduled Medications: . amiodarone  200 mg Oral BID  . calcitRIOL  0.25 mcg Oral Daily  . Chlorhexidine Gluconate Cloth  6 each Topical Daily  . diltiazem  30 mg Oral Q6H  . feeding supplement (PRO-STAT SUGAR FREE 64)  30 mL Oral BID  . hydrocortisone sodium succinate  50 mg Intravenous Q12H  . insulin aspart  0-5 Units Subcutaneous QHS  . insulin aspart  0-9 Units Subcutaneous TID WC  . insulin glargine  12 Units Subcutaneous QHS  . ipratropium-albuterol  3 mL Inhalation BID  . lactobacillus acidophilus  2 tablet Oral TID  . levETIRAcetam  500 mg Oral BID   . linezolid  600 mg Oral Q12H  . Melatonin  6 mg Oral QHS  . midodrine  5 mg Oral TID WC  . mometasone-formoterol  2 puff Inhalation BID  . pantoprazole  40 mg Oral Daily  . potassium chloride  40 mEq Oral TID    have reviewed scheduled and prn medications.  Physical Exam: General:NAD,  able to lie flat on bed comfortably Heart:RRR, s1s2 nl Lungs: Clear b/l, no crackle Abdomen:soft, Non-tender, non-distended Extremities:No edema Dialysis Access: Temporary IJ site clean.  Site looks clean  [employer] 06/04/2019,9:12 AM  LOS: 29 days  Pager: [phone]

## 2019-06-04 NOTE — H&P (Signed)
Physical Medicine and Rehabilitation Admission H&P  CC: Debility due to multiple medical issues.    HPI: Dylan Goodman is a 68 year old male with history of A fib, T2DM, CKD, nasopharyngeal cancer, Menetrier disease,  PUD/GIB, CAD s/p CABG, COVID 28 (admitted [facility] 9/14-9/19) who has had issues with dizziness/CP treated with IVF in ED on 10/5 and recurrent episode on 10/6. EMS activated and he had 2 minutes witnessed tonic clonic seizure that responded to versed. He was confused at admission to [facility] and COVID test positive. CT head negative for acute changes. Plans were to transfer patient to [facility] but he developed VT v/s Afib with RVR leading to respiratory arrest treated with defibrillation and CPR --down time 11 minutes and he was intubated and transferred to [facility] for management. He was started on broad spectrum antibiotics, amiodarone and BLE dopplers negative for DVT. He was loaded with Keppra and EEG done showing evidence of moderate diffuse encephalopathy with excessive beta activity likely due to benzo's. He tolerated extubation to BIPAP on 10/08.  Dr. Moshe Cipro consulted for acute on chronic renal failure with rise in SCr 5.17 and felt that AKI likely due to ATN in setting of current illness and cardiac arrest. Metabolic acidosis improved with IV bicarb. On 10/15, he developed hypoxia with increased WOB and was started on 30L/100%HFNC and treated with IV lasix as well as steroids due to concerns of COPD exacerbation.   Dr. Vaughan Browner expressed suspicion of Covid pneumonitis due to continued high flow needs.  CT chest done 10/19 revealing extensive patchy consolidation with ground glass opacity prominent in BUL suggestive of PNA, atelectasis or possible early post-inflammatory fibrosis. Incidental findings of moderate to severe centrilobar and paraseptal emphysema with suggestion of diffuse gastric wall thickening--unchanged.  Due to lack of improvement in symptoms, decision was made to treat him with  Remdesivir and antibiotics.  He developed progressive confusion with agitation and was intubated from 10/21 and transferred to [facility].   He developed recurrent acute on CRF with uremia and started on CRRT 10/25.  He did develop hematuria due to acute cystitis--heparin d/c and started on Rocephin per Dr. Noland Fordyce input--> changed to meropenum and Linezolid for ESBL Klebsiella/VRE UTI.   He was transferred back to [facility] on 10/30. He has required midodrine as well as Solu Cortef for hypotension and possible adrenal insufficiency.  CRRT d/c 10/29 and volume status/renal status stable without uremic features. Patient had bladder distension with clots in urine and pelvic ultrasound 11/02 revealing mild left > right hydronephrosis therefore foley placed after discussion with GU due to concerns of urinary  retention. Urine output decreasing    Dylan Goodman says he is doing well today and he is eager to come to rehab. He denies pain, constipation, N/V, and reports that he is sleeping well. He is now off IV heparin and transitioned to Eliquis. He will not require dialysis.   ROS    Past Medical History:  Diagnosis Date  . Atrial fibrillation ([place])   . Bleeding gastrointestinal   . CAD (coronary artery disease)    Status post CABG and PCI  . 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 Cardiomyopathy ([place])   . Chronic depression   . CKD (chronic kidney disease)   . Diabetes mellitus ([place])   . Gastric ulcer with hemorrhage   . Gastritis   . GERD (gastroesophageal reflux disease)   . Hx of CABG 12/1994  . IDA (iron deficiency  anemia)   . 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 Myocardial infarction ([place]) 2017  . Personal history of radiation therapy 05/30/2018   39 treatments with chemotherapy nasopharyngeal cancer  . 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 CAROTID ENDARTERECTOMY  2006  . COLONOSCOPY WITH  PROPOFOL N/A 03/11/2019   Procedure: COLONOSCOPY WITH PROPOFOL;  Surgeon: Toledo, Benay Pike, MD;  Location: [facility] ENDOSCOPY;  Service: Gastroenterology;  Laterality: N/A;  . CORONARY ARTERY BYPASS GRAFT  1996   quadruple bypass  . ESOPHAGOGASTRODUODENOSCOPY (EGD) WITH PROPOFOL N/A 03/11/2019   Procedure: ESOPHAGOGASTRODUODENOSCOPY (EGD) WITH PROPOFOL;  Surgeon: Toledo, Benay Pike, MD;  Location: [facility] ENDOSCOPY;  Service: Gastroenterology;  Laterality: N/A;  . FEMORAL-POPLITEAL BYPASS GRAFT Right 06/1998   11/2001, 08/2003  . 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 LOWER EXTREMITY ANGIOGRAPHY Left 11/11/2018   Procedure: LOWER EXTREMITY ANGIOGRAPHY;  Surgeon: Katha Cabal, MD;  Location: [facility];  Service: Cardiovascular;  Laterality: Left;  . REVISION OF AORTA BIFEMORAL BYPASS    . Stent revascularization left leg  08/2009  . Stent revascularization right leg  07/2009   Family History  Problem Relation Age of Onset  . Coronary artery disease Mother   . Alcohol abuse Father   . Colon cancer Father   . Coronary artery disease Father   . Kidney cancer Father   . Breast cancer Sister    Social History:  reports that he quit smoking about 2 years ago. His smoking use included cigarettes. He has a 75.00 pack-year smoking history. He has never used smokeless tobacco. He reports previous alcohol use. He reports that he does not use drugs. Allergies: No Known Allergies Medications Prior to Admission  Medication Sig Dispense Refill  . apixaban (ELIQUIS) 5 MG TABS tablet Take 1 tablet (5 mg total) by mouth 2 (two) times daily. 60 tablet 0  . budesonide-formoterol (SYMBICORT) 160-4.5 MCG/ACT inhaler Inhale 2 puffs into the lungs 2 (two) times daily. Rinse mouth after use 1 Inhaler 12  . metoprolol tartrate (LOPRESSOR) 100 MG tablet Take 1 tablet (100 mg total) by mouth 2 (two) times daily. 60 tablet 0  . Calcium Carbonate-Vitamin D3 (CALCIUM 600-D) 600-400 MG-UNIT TABS Take 1  tablet by mouth daily.    . Cholecalciferol (VITAMIN D3) 25 MCG (1000 UT) CAPS Take by mouth.    . cilostazol (PLETAL) 100 MG tablet Take 100 mg by mouth 2 (two) times daily.     . colchicine 0.6 MG tablet Take 0.6 mg by mouth once.    . diphenoxylate-atropine (LOMOTIL) 2.5-0.025 MG tablet Take 1 tablet by mouth 4 (four) times daily as needed for diarrhea or loose stools.    . ezetimibe (ZETIA) 10 MG tablet Take 10 mg by mouth daily.     . fenofibrate 160 MG tablet Take 160 mg by mouth daily.     . Insulin Degludec (TRESIBA FLEXTOUCH) 200 UNIT/ML SOPN Inject 12 Units into the skin at bedtime.     . isosorbide mononitrate (IMDUR) 120 MG 24 hr tablet Take 120 mg by mouth daily.    . lansoprazole (PREVACID) 30 MG capsule Take 30 mg by mouth every morning.     Marland Kitchen losartan (COZAAR) 25 MG tablet Take 25 mg by mouth daily.     Marland Kitchen lovastatin (MEVACOR) 40 MG tablet Take 40 mg by mouth 2 (two) times daily.     . meclizine (ANTIVERT)  12.5 MG tablet Take 1 tablet (12.5 mg total) by mouth 3 (three) times daily as needed for dizziness. 30 tablet 0  . nitroGLYCERIN (NITROSTAT) 0.4 MG SL tablet Place 0.4 mg under the tongue every 5 (five) minutes as needed for chest pain.     . sodium bicarbonate 650 MG tablet Take 1 tablet by mouth daily.     . ticagrelor (BRILINTA) 60 MG TABS tablet Take 60 mg by mouth 2 (two) times daily.       Drug Regimen Review  Drug regimen was reviewed and remains appropriate with no significant issues identified  Home: Home Living Family/patient expects to be discharged to:: Private residence Living Arrangements: Spouse/significant other, Children Available Help at Discharge: Family, Available 24 hours/day Type of Home: House Home Access: Level entry Home Layout: Two level, Bed/bath upstairs Alternate Level Stairs-Number of Steps: flight, 16 steps Alternate Level Stairs-Rails: Right Bathroom Shower/Tub: Tub/shower unit, Architectural technologist: Standard Bathroom  Accessibility: Yes Home Equipment: Grab bars - toilet, Walker - 2 wheels   Functional History: Prior Function Level of Independence: Independent Comments: reports dizzy spells, only used RW to walk to ambulate, normally does not use  Functional Status:  Mobility: Bed Mobility Overal bed mobility: Needs Assistance Bed Mobility: Sidelying to Sit, Sit to Sidelying Rolling: Mod assist Sidelying to sit: Min assist Supine to sit: Max assist Sit to supine: Min assist Sit to sidelying: Mod assist, +2 for physical assistance, +2 for safety/equipment General bed mobility comments: minA with trunk support for coming to EoB Transfers Overall transfer level: Needs assistance Equipment used: 2 person hand held assist Transfers: Sit to/from Stand Sit to Stand: Mod assist, +2 physical assistance General transfer comment: pt continues to be modAx2 for powerup to RW however was able to achieve more quickly with increased stability. pt requires modAx2 for sitting due to decreased eccentric control to lower into lower recliner Ambulation/Gait Ambulation/Gait assistance: Min assist, +2 physical assistance Gait Distance (Feet): 2 Feet Assistive device: Rolling walker (2 wheeled) Gait Pattern/deviations: Step-to pattern General Gait Details: minA for steadying with stepping to recliner Gait velocity: slowed Gait velocity interpretation: <1.31 ft/sec, indicative of household ambulator    ADL: ADL Overall ADL's : Needs assistance/impaired Eating/Feeding: Set up, Sitting Eating/Feeding Details (indicate cue type and reason): cues for pacing self during SOB episode Grooming: Set up, Sitting Upper Body Bathing: Minimal assistance, Sitting Lower Body Bathing: Maximal assistance, Sitting/lateral leans, Sit to/from stand Upper Body Dressing : Minimal assistance, Sitting Lower Body Dressing: Maximal assistance, Sitting/lateral leans, Sit to/from stand Toilet Transfer: Moderate assistance, +2 for  physical assistance, +2 for safety/equipment Toilet Transfer Details (indicate cue type and reason): max A +2 to stand this date from [place] and Hygiene: Total assistance, Sit to/from stand Toileting - Clothing Manipulation Details (indicate cue type and reason): total A to clean peri area while standing Functional mobility during ADLs: Minimal assistance(sitting EOB) General ADL Comments: session limited from abdomen pain 2/2 UTI symptoms  Cognition: Cognition Overall Cognitive Status: Within Functional Limits for tasks assessed Orientation Level: Oriented X4 Cognition Arousal/Alertness: Awake/alert Behavior During Therapy: WFL for tasks assessed/performed Overall Cognitive Status: Within Functional Limits for tasks assessed General Comments: able to recall therapists from previous sessions  Physical Exam: Blood pressure 123/75, pulse 98, temperature (!) 97.5 F (36.4 C), temperature source Oral, resp. rate 16, height 5\' 11"  (1.803 m), weight 87.7 kg, SpO2 91 %. General: Alert and oriented x 3, No apparent distress, lying down in bed sleeping, easily  arousable.  HEENT: Head is normocephalic, atraumatic, PERRLA, EOMI, sclera anicteric, oral mucosa pink and moist, dentition intact, ext ear canals clear,  Neck: Supple without JVD or lymphadenopathy Heart: Reg rate and rhythm. No murmurs rubs or gallops Chest: CTA bilaterally without wheezes, rales, or rhonchi; no distress, but increased work of breathing from yesterday. [place] in place.  Abdomen: Soft, non-tender, non-distended, bowel sounds positive. Extremities: No clubbing, cyanosis, or edema. Pulses are 2+ Skin: Clean and intact without signs of breakdown Neuro/Musculoskeletal: Pt is cognitively appropriate with normal insight, memory, and awareness. Cranial nerves 2-12 are intact. Sensory exam is normal. Reflexes are 2+ in all 4's. Fine motor coordination is intact. Tremor in bilateral hands, most pronounced  during muscle testing. Motor function: RUE: 5/5 EF, EE, WE, hand grip LUE: 5/5 EF, 4/5 EE, 5/5 WE and hand grip RLE: 3/5 HF, 3/5 KE, 5/5 DF/PF LLE: 3/5 HF, 2/5 KE, 5/5 DF/PF Psych: Pt's affect is appropriate. Pt is cooperative  Results for orders placed or performed during the hospital encounter of 05/06/19 (from the past 48 hour(s))  Glucose, capillary     Status: Abnormal   Collection Time: 06/02/19  4:06 PM  Result Value Ref Range   Glucose-Capillary 153 (H) 70 - 99 mg/dL  Glucose, capillary     Status: Abnormal   Collection Time: 06/02/19  8:45 PM  Result Value Ref Range   Glucose-Capillary 194 (H) 70 - 99 mg/dL  Heparin level (unfractionated)     Status: None   Collection Time: 06/03/19 12:16 AM  Result Value Ref Range   Heparin Unfractionated 0.32 0.30 - 0.70 IU/mL    Comment: (NOTE) If heparin results are below expected values, and patient dosage has  been confirmed, suggest follow up testing of antithrombin III levels. Performed at [facility], [place] [address] [place], [place] [address]   Basic metabolic panel     Status: Abnormal   Collection Time: 06/03/19 12:16 AM  Result Value Ref Range   Sodium 137 135 - 145 mmol/L   Potassium 2.8 (L) 3.5 - 5.1 mmol/L   Chloride 106 98 - 111 mmol/L   CO2 21 (L) 22 - 32 mmol/L   Glucose, Bld 192 (H) 70 - 99 mg/dL   BUN 57 (H) 8 - 23 mg/dL   Creatinine, Ser 2.42 (H) 0.61 - 1.24 mg/dL   Calcium 7.0 (L) 8.9 - 10.3 mg/dL   GFR calc non Af Amer 27 (L) >60 mL/min   GFR calc Af Amer 31 (L) >60 mL/min   Anion gap 10 5 - 15    Comment: Performed at [facility] [address], [place], [place] [address]  CBC     Status: Abnormal   Collection Time: 06/03/19 12:16 AM  Result Value Ref Range   WBC 8.0 4.0 - 10.5 K/uL   RBC 3.36 (L) 4.22 - 5.81 MIL/uL   Hemoglobin 10.3 (L) 13.0 - 17.0 g/dL   HCT 33.5 (L) 39.0 - 52.0 %   MCV 99.7 80.0 - 100.0 fL   MCH 30.7 26.0 - 34.0 pg   MCHC 30.7 30.0 - 36.0 g/dL   RDW 19.8 (H) 11.5  - 15.5 %   Platelets 89 (L) 150 - 400 K/uL    Comment: REPEATED TO VERIFY SPECIMEN CHECKED FOR CLOTS Immature Platelet Fraction may be clinically indicated, consider ordering this additional test [id number] CONSISTENT WITH PREVIOUS RESULT    nRBC 0.0 0.0 - 0.2 %    Comment: Performed at [facility], [place] [address], [place], [place] [address]  Glucose, capillary     Status: Abnormal   Collection Time: 06/03/19  7:27 AM  Result Value Ref Range   Glucose-Capillary 105 (H) 70 - 99 mg/dL  Heparin level (unfractionated)     Status: None   Collection Time: 06/03/19  7:58 AM  Result Value Ref Range   Heparin Unfractionated 0.59 0.30 - 0.70 IU/mL    Comment: (NOTE) If heparin results are below expected values, and patient dosage has  been confirmed, suggest follow up testing of antithrombin III levels. Performed at [facility], [place] [address], [place], [place] [address]   Glucose, capillary     Status: Abnormal   Collection Time: 06/03/19 12:40 PM  Result Value Ref Range   Glucose-Capillary 142 (H) 70 - 99 mg/dL  Glucose, capillary     Status: Abnormal   Collection Time: 06/03/19  4:47 PM  Result Value Ref Range   Glucose-Capillary 139 (H) 70 - 99 mg/dL  Glucose, capillary     Status: Abnormal   Collection Time: 06/03/19  9:01 PM  Result Value Ref Range   Glucose-Capillary 136 (H) 70 - 99 mg/dL  Basic metabolic panel     Status: Abnormal   Collection Time: 06/04/19  3:37 AM  Result Value Ref Range   Sodium 142 135 - 145 mmol/L   Potassium 3.9 3.5 - 5.1 mmol/L   Chloride 110 98 - 111 mmol/L   CO2 21 (L) 22 - 32 mmol/L   Glucose, Bld 134 (H) 70 - 99 mg/dL   BUN 53 (H) 8 - 23 mg/dL   Creatinine, Ser 2.28 (H) 0.61 - 1.24 mg/dL   Calcium 7.0 (L) 8.9 - 10.3 mg/dL   GFR calc non Af Amer 29 (L) >60 mL/min   GFR calc Af Amer 34 (L) >60 mL/min   Anion gap 11 5 - 15    Comment: Performed at [facility] [address], [place], [place] [address]  CBC     Status:  Abnormal   Collection Time: 06/04/19  3:37 AM  Result Value Ref Range   WBC 8.1 4.0 - 10.5 K/uL   RBC 3.48 (L) 4.22 - 5.81 MIL/uL   Hemoglobin 10.6 (L) 13.0 - 17.0 g/dL   HCT 35.1 (L) 39.0 - 52.0 %   MCV 100.9 (H) 80.0 - 100.0 fL   MCH 30.5 26.0 - 34.0 pg   MCHC 30.2 30.0 - 36.0 g/dL   RDW 19.9 (H) 11.5 - 15.5 %   Platelets 108 (L) 150 - 400 K/uL    Comment: Immature Platelet Fraction may be clinically indicated, consider ordering this additional test [id number] CONSISTENT WITH PREVIOUS RESULT    nRBC 0.0 0.0 - 0.2 %    Comment: Performed at [facility], [place] [address] [place], [place] [address]  Heparin level (unfractionated)     Status: Abnormal   Collection Time: 06/04/19  3:37 AM  Result Value Ref Range   Heparin Unfractionated 1.10 (H) 0.30 - 0.70 IU/mL    Comment: (NOTE) If heparin results are below expected values, and patient dosage has  been confirmed, suggest follow up testing of antithrombin III levels. Performed at [facility], [place] [address] [place], [place] [address]   Phosphorus     Status: None   Collection Time: 06/04/19  3:37 AM  Result Value Ref Range   Phosphorus 3.3 2.5 - 4.6 mg/dL    Comment:  Performed at [facility], [place] [address] [place], [place] [address]  Urinalysis, Routine w reflex microscopic     Status: Abnormal   Collection Time: 06/04/19  7:21 AM  Result Value Ref Range   Color, Urine YELLOW YELLOW   APPearance HAZY (A) CLEAR   Specific Gravity, Urine 1.013 1.005 - 1.030   pH 5.0 5.0 - 8.0   Glucose, UA 50 (A) NEGATIVE mg/dL   Hgb urine dipstick LARGE (A) NEGATIVE   Bilirubin Urine NEGATIVE NEGATIVE   Ketones, ur NEGATIVE NEGATIVE mg/dL   Protein, ur 30 (A) NEGATIVE mg/dL   Nitrite NEGATIVE NEGATIVE   Leukocytes,Ua LARGE (A) NEGATIVE   RBC / HPF 21-50 0 - 5 RBC/hpf   WBC, UA >50 (H) 0 - 5 WBC/hpf   Bacteria, UA FEW (A) NONE SEEN   Squamous Epithelial / LPF 0-5 0 - 5   WBC Clumps PRESENT    Mucus PRESENT     Budding Yeast PRESENT    Hyphae Yeast PRESENT     Comment: Performed at [facility], [place] [address] [place], [place] [address]  Glucose, capillary     Status: Abnormal   Collection Time: 06/04/19  7:45 AM  Result Value Ref Range   Glucose-Capillary 124 (H) 70 - 99 mg/dL  Glucose, capillary     Status: Abnormal   Collection Time: 06/04/19 11:13 AM  Result Value Ref Range   Glucose-Capillary 145 (H) 70 - 99 mg/dL   No results found.     Medical Problem List and Plan: 1.  Impaired gait and mobility secondary to acute respiratory failure with hypoxemia.   -CIR 3 hours per day for at least 5 days per week.   -WBAT 2.  Antithrombotics: -DVT/anticoagulation:  Pharmaceutical: Other (comment)--Eliquis resumed 11/5  -antiplatelet therapy: N/A 3. Pain Management: well controlled 4. Mood: pleasant, cooperative  -antipsychotic agents: none 5. Neuropsych: This patient is capable of making decisions on his own behalf. 6. Skin/Wound Care: Monitor for skin tears 7. Fluids/Electrolytes/Nutrition: Labs on 11/7 8.  Klebsiella ESBL/VRE UTI: Treated with Meropenum 1/1-11/6 and Linzeloid  Added 11/02-11/6. Repeat UA with continued bacteruria.  10. New onset seizures: On Keppra bid. : 11. Hypotension/Adrenal insufficiency: Midodrine being weaned off and continue slow Solu-cortef taper.   -Unclear etiology, worsened by CRRT 12. Acute on chronic Renal failure:  Cr now stable, nephrology recommends holding off HD 13... Acute hypoxic respiratory failure/pneumonia due to COVID-19 virus.   -saturating well on room air.  -s/p course of remdesivir and steroids, stress dose steroids due to concern for adrenal insufficiency.  -continue Solu-Cortef, wean as tolerated  -continue inhalers 14. Chronic A fib with RVR.   -asymptomatic, stable  -transitioned from Heparin gtt to Eliquis on 11/5.  -Resume metoprolol once BP has stabilized  -Continue Cardizem. 15. ESBL/VRE/UTI/possibel acute urinary  retention: completed Meropenem on 11/6, on linezolid for total of 7 days.    Bary Leriche, PA-C 06/04/2019   I have personally performed a face to face diagnostic evaluation, including, but not limited to relevant history and physical exam findings, of this patient and developed relevant assessment and plan.  Additionally, I have reviewed and concur with the physician assistant's documentation above.  Leeroy Cha, MD

## 2019-06-05 ENCOUNTER — Encounter (HOSPITAL_COMMUNITY): Payer: Self-pay | Admitting: Physical Medicine and Rehabilitation

## 2019-06-05 ENCOUNTER — Inpatient Hospital Stay (HOSPITAL_COMMUNITY)
Admission: RE | Admit: 2019-06-05 | Discharge: 2019-06-06 | DRG: 092 | Disposition: A | Discharge status: Other Acute Inpatient Hospital | Payer: Medicare Other | Source: Intra-hospital | Attending: Family Medicine | Admitting: Family Medicine

## 2019-06-05 ENCOUNTER — Other Ambulatory Visit: Payer: Self-pay

## 2019-06-05 ENCOUNTER — Encounter (HOSPITAL_COMMUNITY): Payer: Self-pay

## 2019-06-05 DIAGNOSIS — I482 Chronic atrial fibrillation, unspecified: Secondary | ICD-10-CM | POA: Diagnosis present

## 2019-06-05 DIAGNOSIS — Z951 Presence of aortocoronary bypass graft: Secondary | ICD-10-CM | POA: Diagnosis not present

## 2019-06-05 DIAGNOSIS — Z79899 Other long term (current) drug therapy: Secondary | ICD-10-CM

## 2019-06-05 DIAGNOSIS — K219 Gastro-esophageal reflux disease without esophagitis: Secondary | ICD-10-CM | POA: Diagnosis present

## 2019-06-05 DIAGNOSIS — Z85828 Personal history of other malignant neoplasm of skin: Secondary | ICD-10-CM

## 2019-06-05 DIAGNOSIS — G7281 Critical illness myopathy: Secondary | ICD-10-CM | POA: Diagnosis present

## 2019-06-05 DIAGNOSIS — I10 Essential (primary) hypertension: Secondary | ICD-10-CM | POA: Diagnosis present

## 2019-06-05 DIAGNOSIS — E1122 Type 2 diabetes mellitus with diabetic chronic kidney disease: Secondary | ICD-10-CM | POA: Diagnosis present

## 2019-06-05 DIAGNOSIS — N184 Chronic kidney disease, stage 4 (severe): Secondary | ICD-10-CM | POA: Diagnosis present

## 2019-06-05 DIAGNOSIS — I5022 Chronic systolic (congestive) heart failure: Secondary | ICD-10-CM | POA: Diagnosis present

## 2019-06-05 DIAGNOSIS — Z8619 Personal history of other infectious and parasitic diseases: Secondary | ICD-10-CM

## 2019-06-05 DIAGNOSIS — I959 Hypotension, unspecified: Secondary | ICD-10-CM | POA: Diagnosis not present

## 2019-06-05 DIAGNOSIS — Z794 Long term (current) use of insulin: Secondary | ICD-10-CM

## 2019-06-05 DIAGNOSIS — F329 Major depressive disorder, single episode, unspecified: Secondary | ICD-10-CM | POA: Diagnosis present

## 2019-06-05 DIAGNOSIS — I428 Other cardiomyopathies: Secondary | ICD-10-CM | POA: Diagnosis present

## 2019-06-05 DIAGNOSIS — I5023 Acute on chronic systolic (congestive) heart failure: Secondary | ICD-10-CM | POA: Diagnosis present

## 2019-06-05 DIAGNOSIS — E2740 Unspecified adrenocortical insufficiency: Secondary | ICD-10-CM | POA: Diagnosis present

## 2019-06-05 DIAGNOSIS — Z8249 Family history of ischemic heart disease and other diseases of the circulatory system: Secondary | ICD-10-CM

## 2019-06-05 DIAGNOSIS — Z9221 Personal history of antineoplastic chemotherapy: Secondary | ICD-10-CM

## 2019-06-05 DIAGNOSIS — I714 Abdominal aortic aneurysm, without rupture: Secondary | ICD-10-CM | POA: Diagnosis present

## 2019-06-05 DIAGNOSIS — I2510 Atherosclerotic heart disease of native coronary artery without angina pectoris: Secondary | ICD-10-CM | POA: Diagnosis present

## 2019-06-05 DIAGNOSIS — Z7901 Long term (current) use of anticoagulants: Secondary | ICD-10-CM

## 2019-06-05 DIAGNOSIS — I252 Old myocardial infarction: Secondary | ICD-10-CM

## 2019-06-05 DIAGNOSIS — R0603 Acute respiratory distress: Secondary | ICD-10-CM | POA: Diagnosis not present

## 2019-06-05 DIAGNOSIS — N179 Acute kidney failure, unspecified: Secondary | ICD-10-CM | POA: Diagnosis present

## 2019-06-05 DIAGNOSIS — Z923 Personal history of irradiation: Secondary | ICD-10-CM | POA: Diagnosis not present

## 2019-06-05 DIAGNOSIS — Z85818 Personal history of malignant neoplasm of other sites of lip, oral cavity, and pharynx: Secondary | ICD-10-CM | POA: Diagnosis not present

## 2019-06-05 DIAGNOSIS — R0602 Shortness of breath: Secondary | ICD-10-CM

## 2019-06-05 DIAGNOSIS — N400 Enlarged prostate without lower urinary tract symptoms: Secondary | ICD-10-CM | POA: Diagnosis present

## 2019-06-05 DIAGNOSIS — N136 Pyonephrosis: Secondary | ICD-10-CM | POA: Diagnosis present

## 2019-06-05 DIAGNOSIS — K2960 Other gastritis without bleeding: Secondary | ICD-10-CM | POA: Diagnosis present

## 2019-06-05 DIAGNOSIS — Z87891 Personal history of nicotine dependence: Secondary | ICD-10-CM

## 2019-06-05 DIAGNOSIS — B952 Enterococcus as the cause of diseases classified elsewhere: Secondary | ICD-10-CM | POA: Diagnosis present

## 2019-06-05 DIAGNOSIS — K529 Noninfective gastroenteritis and colitis, unspecified: Secondary | ICD-10-CM | POA: Diagnosis present

## 2019-06-05 DIAGNOSIS — J9601 Acute respiratory failure with hypoxia: Secondary | ICD-10-CM | POA: Diagnosis present

## 2019-06-05 DIAGNOSIS — Z1621 Resistance to vancomycin: Secondary | ICD-10-CM | POA: Diagnosis present

## 2019-06-05 LAB — CBC
HCT: 34.6 % — ABNORMAL LOW (ref 39.0–52.0)
Hemoglobin: 10.5 g/dL — ABNORMAL LOW (ref 13.0–17.0)
MCH: 31.3 pg (ref 26.0–34.0)
MCHC: 30.3 g/dL (ref 30.0–36.0)
MCV: 103 fL — ABNORMAL HIGH (ref 80.0–100.0)
Platelets: 127 10*3/uL — ABNORMAL LOW (ref 150–400)
RBC: 3.36 MIL/uL — ABNORMAL LOW (ref 4.22–5.81)
RDW: 20.3 % — ABNORMAL HIGH (ref 11.5–15.5)
WBC: 8 10*3/uL (ref 4.0–10.5)
nRBC: 0 % (ref 0.0–0.2)

## 2019-06-05 LAB — BASIC METABOLIC PANEL
Anion gap: 10 (ref 5–15)
BUN: 49 mg/dL — ABNORMAL HIGH (ref 8–23)
CO2: 19 mmol/L — ABNORMAL LOW (ref 22–32)
Calcium: 6.8 mg/dL — ABNORMAL LOW (ref 8.9–10.3)
Chloride: 115 mmol/L — ABNORMAL HIGH (ref 98–111)
Creatinine, Ser: 2.26 mg/dL — ABNORMAL HIGH (ref 0.61–1.24)
GFR calc Af Amer: 34 mL/min — ABNORMAL LOW (ref >60)
GFR calc non Af Amer: 29 mL/min — ABNORMAL LOW (ref >60)
Glucose, Bld: 140 mg/dL — ABNORMAL HIGH (ref 70–99)
Potassium: 4.2 mmol/L (ref 3.5–5.1)
Sodium: 144 mmol/L (ref 135–145)

## 2019-06-05 LAB — GLUCOSE, CAPILLARY
Glucose-Capillary: 106 mg/dL — ABNORMAL HIGH (ref 70–99)
Glucose-Capillary: 108 mg/dL — ABNORMAL HIGH (ref 70–99)
Glucose-Capillary: 53 mg/dL — ABNORMAL LOW (ref 70–99)
Glucose-Capillary: 61 mg/dL — ABNORMAL LOW (ref 70–99)
Glucose-Capillary: 101 mg/dL — ABNORMAL HIGH (ref 70–99)

## 2019-06-05 MED ORDER — INSULIN ASPART 100 UNIT/ML ~~LOC~~ SOLN: 0.0000 [IU] | Freq: Every day | SUBCUTANEOUS | Status: DC

## 2019-06-05 MED ORDER — FLAVOXATE HCL 100 MG PO TABS
100.0000 mg | ORAL_TABLET | Freq: Three times a day (TID) | ORAL | Status: DC
  Filled 2019-06-05 (×3): qty 1

## 2019-06-05 MED ORDER — HYDROCODONE-ACETAMINOPHEN 5-325 MG PO TABS: 1.0000 | ORAL_TABLET | ORAL | Status: DC | PRN

## 2019-06-05 MED ORDER — PREDNISONE 10 MG (21) PO TBPK: 10.0000 mg | ORAL_TABLET | ORAL | Status: DC

## 2019-06-05 MED ORDER — APIXABAN 5 MG PO TABS: 2.5000 mg | ORAL_TABLET | Freq: Two times a day (BID) | ORAL | 0 refills | Status: AC

## 2019-06-05 MED ORDER — BACID PO TABS: 2.0000 | ORAL_TABLET | Freq: Three times a day (TID) | ORAL | 0 refills | Status: DC

## 2019-06-05 MED ORDER — LOVASTATIN 40 MG PO TABS: 40.0000 mg | ORAL_TABLET | Freq: Every day | ORAL | 0 refills | Status: AC

## 2019-06-05 MED ORDER — POLYETHYLENE GLYCOL 3350 17 G PO PACK: 17.0000 g | PACK | Freq: Every day | ORAL | 0 refills | Status: AC | PRN

## 2019-06-05 MED ORDER — PREDNISONE 10 MG (21) PO TBPK: 10.0000 mg | ORAL_TABLET | Freq: Three times a day (TID) | ORAL | Status: DC

## 2019-06-05 MED ORDER — PREDNISONE 10 MG (21) PO TBPK: 20.0000 mg | ORAL_TABLET | Freq: Every evening | ORAL | Status: DC

## 2019-06-05 MED ORDER — AMIODARONE HCL 200 MG PO TABS: 200.0000 mg | ORAL_TABLET | Freq: Two times a day (BID) | ORAL | 0 refills | Status: AC

## 2019-06-05 MED ORDER — MOMETASONE FURO-FORMOTEROL FUM 200-5 MCG/ACT IN AERO: 2.0000 | INHALATION_SPRAY | Freq: Two times a day (BID) | RESPIRATORY_TRACT | 0 refills | Status: AC

## 2019-06-05 MED ORDER — PREDNISONE 10 MG (21) PO TBPK: 10.0000 mg | ORAL_TABLET | Freq: Four times a day (QID) | ORAL | Status: DC

## 2019-06-05 MED ORDER — INSULIN ASPART 100 UNIT/ML ~~LOC~~ SOLN: 0.0000 [IU] | Freq: Three times a day (TID) | SUBCUTANEOUS | 11 refills | Status: AC

## 2019-06-05 MED ORDER — INFLUENZA VAC A&B SA ADJ QUAD 0.5 ML IM PRSY
0.5000 mL | PREFILLED_SYRINGE | INTRAMUSCULAR | Status: DC
  Filled 2019-06-05: qty 0.5

## 2019-06-05 MED ORDER — CHLORHEXIDINE GLUCONATE CLOTH 2 % EX PADS: 6.0000 | MEDICATED_PAD | Freq: Every day | CUTANEOUS | 0 refills | Status: DC

## 2019-06-05 MED ORDER — GUAIFENESIN-DM 100-10 MG/5ML PO SYRP: 5.0000 mL | ORAL_SOLUTION | Freq: Four times a day (QID) | ORAL | Status: DC | PRN

## 2019-06-05 MED ORDER — PANTOPRAZOLE SODIUM 40 MG PO TBEC: 40.0000 mg | DELAYED_RELEASE_TABLET | Freq: Every day | ORAL | Status: DC

## 2019-06-05 MED ORDER — PREDNISONE 10 MG (21) PO TBPK
20.0000 mg | ORAL_TABLET | Freq: Every morning | ORAL | Status: DC
  Filled 2019-06-05: qty 21

## 2019-06-05 MED ORDER — MIDODRINE HCL 5 MG PO TABS: 5.0000 mg | ORAL_TABLET | Freq: Three times a day (TID) | ORAL | 0 refills | Status: AC

## 2019-06-05 MED ORDER — PREDNISONE 20 MG PO TABS: 20.0000 mg | ORAL_TABLET | Freq: Every day | ORAL | Status: DC

## 2019-06-05 MED ORDER — MIDODRINE HCL 5 MG PO TABS
5.0000 mg | ORAL_TABLET | Freq: Three times a day (TID) | ORAL | Status: DC
  Administered 2019-06-05: 5 mg via ORAL
  Filled 2019-06-05: qty 1

## 2019-06-05 MED ORDER — PROCHLORPERAZINE EDISYLATE 10 MG/2ML IJ SOLN: 5.0000 mg | Freq: Four times a day (QID) | INTRAMUSCULAR | Status: DC | PRN

## 2019-06-05 MED ORDER — AMIODARONE HCL 200 MG PO TABS
200.0000 mg | ORAL_TABLET | Freq: Two times a day (BID) | ORAL | Status: DC
  Administered 2019-06-05: 200 mg via ORAL
  Filled 2019-06-05: qty 1

## 2019-06-05 MED ORDER — DIPHENHYDRAMINE HCL 12.5 MG/5ML PO ELIX
12.5000 mg | ORAL_SOLUTION | Freq: Four times a day (QID) | ORAL | Status: DC | PRN
  Administered 2019-06-06: 25 mg via ORAL
  Filled 2019-06-05: qty 10

## 2019-06-05 MED ORDER — PREDNISONE 10 MG (21) PO TBPK: ORAL_TABLET | ORAL | 0 refills | Status: DC

## 2019-06-05 MED ORDER — APIXABAN 5 MG PO TABS
5.0000 mg | ORAL_TABLET | Freq: Two times a day (BID) | ORAL | Status: DC
  Administered 2019-06-05: 5 mg via ORAL
  Filled 2019-06-05: qty 1

## 2019-06-05 MED ORDER — INSULIN ASPART 100 UNIT/ML ~~LOC~~ SOLN: 0.0000 [IU] | Freq: Every day | SUBCUTANEOUS | 11 refills | Status: AC

## 2019-06-05 MED ORDER — SODIUM BICARBONATE 650 MG PO TABS
650.0000 mg | ORAL_TABLET | Freq: Two times a day (BID) | ORAL | Status: DC
  Administered 2019-06-05: 650 mg via ORAL
  Filled 2019-06-05: qty 1

## 2019-06-05 MED ORDER — MOMETASONE FURO-FORMOTEROL FUM 200-5 MCG/ACT IN AERO
2.0000 | INHALATION_SPRAY | Freq: Two times a day (BID) | RESPIRATORY_TRACT | Status: DC
  Administered 2019-06-05: 2 via RESPIRATORY_TRACT
  Filled 2019-06-05: qty 8.8

## 2019-06-05 MED ORDER — DILTIAZEM HCL ER COATED BEADS 240 MG PO CP24: 240.0000 mg | ORAL_CAPSULE | Freq: Every day | ORAL | 0 refills | Status: AC

## 2019-06-05 MED ORDER — PREDNISONE 20 MG PO TABS: 50.0000 mg | ORAL_TABLET | Freq: Every day | ORAL | Status: DC

## 2019-06-05 MED ORDER — SODIUM BICARBONATE 650 MG PO TABS: 650.0000 mg | ORAL_TABLET | Freq: Two times a day (BID) | ORAL | 1 refills | Status: AC

## 2019-06-05 MED ORDER — PREDNISONE 10 MG PO TABS: ORAL_TABLET | ORAL | 0 refills | Status: DC

## 2019-06-05 MED ORDER — PREDNISONE 20 MG PO TABS: 30.0000 mg | ORAL_TABLET | Freq: Every day | ORAL | Status: DC

## 2019-06-05 MED ORDER — FLEET ENEMA 7-19 GM/118ML RE ENEM: 1.0000 | ENEMA | Freq: Once | RECTAL | Status: DC | PRN

## 2019-06-05 MED ORDER — INSULIN ASPART 100 UNIT/ML ~~LOC~~ SOLN: 0.0000 [IU] | Freq: Three times a day (TID) | SUBCUTANEOUS | Status: DC

## 2019-06-05 MED ORDER — IPRATROPIUM-ALBUTEROL 0.5-2.5 (3) MG/3ML IN SOLN: 3.0000 mL | Freq: Two times a day (BID) | RESPIRATORY_TRACT | 1 refills | Status: AC

## 2019-06-05 MED ORDER — PANTOPRAZOLE SODIUM 40 MG PO TBEC: 40.0000 mg | DELAYED_RELEASE_TABLET | Freq: Every day | ORAL | 0 refills | Status: AC

## 2019-06-05 MED ORDER — MELATONIN 3 MG PO TABS
6.0000 mg | ORAL_TABLET | Freq: Every day | ORAL | Status: DC
  Administered 2019-06-05: 6 mg via ORAL
  Filled 2019-06-05: qty 2

## 2019-06-05 MED ORDER — CALCITRIOL 0.25 MCG PO CAPS: 0.2500 ug | ORAL_CAPSULE | Freq: Every day | ORAL | Status: DC

## 2019-06-05 MED ORDER — INSULIN GLARGINE 100 UNIT/ML ~~LOC~~ SOLN: 12.0000 [IU] | Freq: Every day | SUBCUTANEOUS | 11 refills | Status: AC

## 2019-06-05 MED ORDER — INSULIN GLARGINE 100 UNIT/ML ~~LOC~~ SOLN
12.0000 [IU] | Freq: Every day | SUBCUTANEOUS | Status: DC
  Administered 2019-06-05: 12 [IU] via SUBCUTANEOUS
  Filled 2019-06-05 (×2): qty 0.12

## 2019-06-05 MED ORDER — PRO-STAT SUGAR FREE PO LIQD
30.0000 mL | Freq: Two times a day (BID) | ORAL | Status: DC
  Filled 2019-06-05: qty 30

## 2019-06-05 MED ORDER — ACETAMINOPHEN 325 MG PO TABS: 325.0000 mg | ORAL_TABLET | ORAL | Status: DC | PRN

## 2019-06-05 MED ORDER — LINEZOLID 600 MG PO TABS
600.0000 mg | ORAL_TABLET | Freq: Two times a day (BID) | ORAL | Status: AC
  Administered 2019-06-05: 600 mg via ORAL
  Filled 2019-06-05: qty 1

## 2019-06-05 MED ORDER — PNEUMOCOCCAL VAC POLYVALENT 25 MCG/0.5ML IJ INJ
0.5000 mL | INJECTION | INTRAMUSCULAR | Status: DC
  Filled 2019-06-05: qty 0.5

## 2019-06-05 MED ORDER — PROCHLORPERAZINE 25 MG RE SUPP: 12.5000 mg | Freq: Four times a day (QID) | RECTAL | Status: DC | PRN

## 2019-06-05 MED ORDER — ALUM & MAG HYDROXIDE-SIMETH 200-200-20 MG/5ML PO SUSP: 30.0000 mL | ORAL | Status: DC | PRN

## 2019-06-05 MED ORDER — POLYETHYLENE GLYCOL 3350 17 G PO PACK: 17.0000 g | PACK | Freq: Every day | ORAL | Status: DC | PRN

## 2019-06-05 MED ORDER — GERHARDT'S BUTT CREAM
1.0000 "application " | TOPICAL_CREAM | Freq: Four times a day (QID) | CUTANEOUS | Status: DC
  Administered 2019-06-05: 1 via TOPICAL
  Filled 2019-06-05: qty 1

## 2019-06-05 MED ORDER — CHLORHEXIDINE GLUCONATE CLOTH 2 % EX PADS: 6.0000 | MEDICATED_PAD | Freq: Every day | CUTANEOUS | Status: DC

## 2019-06-05 MED ORDER — DILTIAZEM HCL ER COATED BEADS 120 MG PO CP24: 240.0000 mg | ORAL_CAPSULE | Freq: Every day | ORAL | Status: DC

## 2019-06-05 MED ORDER — PREDNISONE 10 MG (21) PO TBPK: 20.0000 mg | ORAL_TABLET | Freq: Every morning | ORAL | Status: DC

## 2019-06-05 MED ORDER — PREDNISONE 20 MG PO TABS: 40.0000 mg | ORAL_TABLET | Freq: Every day | ORAL | Status: DC

## 2019-06-05 MED ORDER — LEVETIRACETAM 500 MG PO TABS
500.0000 mg | ORAL_TABLET | Freq: Two times a day (BID) | ORAL | Status: DC
  Administered 2019-06-05: 500 mg via ORAL
  Filled 2019-06-05: qty 1

## 2019-06-05 MED ORDER — CALCITRIOL 0.25 MCG PO CAPS: 0.2500 ug | ORAL_CAPSULE | Freq: Every day | ORAL | 0 refills | Status: AC

## 2019-06-05 MED ORDER — BACID PO TABS
2.0000 | ORAL_TABLET | Freq: Three times a day (TID) | ORAL | Status: DC
  Administered 2019-06-05: 2 via ORAL
  Filled 2019-06-05 (×3): qty 2

## 2019-06-05 MED ORDER — IPRATROPIUM-ALBUTEROL 0.5-2.5 (3) MG/3ML IN SOLN
3.0000 mL | Freq: Two times a day (BID) | RESPIRATORY_TRACT | Status: DC
  Administered 2019-06-05: 3 mL via RESPIRATORY_TRACT
  Filled 2019-06-05: qty 3

## 2019-06-05 MED ORDER — LEVETIRACETAM 500 MG PO TABS: 500.0000 mg | ORAL_TABLET | Freq: Two times a day (BID) | ORAL | 0 refills | Status: AC

## 2019-06-05 MED ORDER — MELATONIN 3 MG PO TABS: 6.0000 mg | ORAL_TABLET | Freq: Every day | ORAL | 0 refills | Status: AC

## 2019-06-05 MED ORDER — PREDNISONE 10 MG PO TABS: 5.0000 mg | ORAL_TABLET | Freq: Every day | ORAL | Status: DC

## 2019-06-05 MED ORDER — BISACODYL 10 MG RE SUPP: 10.0000 mg | Freq: Every day | RECTAL | Status: DC | PRN

## 2019-06-05 MED ORDER — PROCHLORPERAZINE MALEATE 5 MG PO TABS: 5.0000 mg | ORAL_TABLET | Freq: Four times a day (QID) | ORAL | Status: DC | PRN

## 2019-06-05 MED ORDER — PRO-STAT SUGAR FREE PO LIQD: 30.0000 mL | Freq: Two times a day (BID) | ORAL | 0 refills | Status: AC

## 2019-06-05 MED ORDER — PREDNISONE 10 MG PO TABS: 10.0000 mg | ORAL_TABLET | Freq: Every day | ORAL | Status: DC

## 2019-06-05 NOTE — Progress Notes (Signed)
Dylan Ribas, MD  Physician  Physical Medicine and Rehabilitation  PMR Pre-admission  Signed  Date of Service:  06/03/2019 1:42 PM      Related encounter: Admission (Current) from 05/06/2019 in [place] [facility]      Signed         PMR Admission [place] Assessment  Patient: Dylan Goodman is an 68 y.o., male MRN: [id number] DOB: [birth year] Height: 5' 11" (180.3 cm) Weight: 87.7 kg  Insurance Information HMO:     PPO:      PCP:      IPA:      80/20: yes     OTHER:  PRIMARY: Medicare Part A and B      Policy#: [id number]      Subscriber: Patient CM Name:       Phone#:      Fax#:  Pre-Cert#:       Employer:  Benefits:  Phone #: online     Name: verified eligibility via [place] on 06/04/2019 Eff. Date: Part A and B effective 06/29/2018     Deduct: $1,408      Out of Pocket Max: NA      Life Max: NA CIR: Covered per Medicare guidelines once yearly deductible has been met.      SNF: days 1-20, 100%; days 21-100, 80%.  Outpatient: 80%     Co-Pay: 20% Home Health: 100%      Co-Pay: 0% DME: 80%     Co-Pay: 20% Providers: Pt's choice SECONDARY: BCBS      Policy#: [id number]      Subscriber: Patient CM Name:       Phone#:      Fax#:  Pre-Cert#:       Employer:  Benefits:  Phone #: [phone]     Name:  Eff. Date:      Deduct:       Out of Pocket Max:       Life Max:  CIR:       SNF:  Outpatient:      Co-Pay:  Home Health:       Co-Pay:  DME:      Co-Pay:   Medicaid Application Date      Case Manager:  Disability Application Date:       Case Worker:   The Data Collection Information Summary for patients in Inpatient Rehabilitation Facilities with attached Privacy Act [place] Records was provided and verbally reviewed with: Patient  Emergency Contact Information         Contact Information    Name Relation Home Work Mobile   Dylan Goodman Spouse [phone]  [phone]      Current Medical  History  Patient Admitting Diagnosis: Debility  History of Present Illness: Dylan Goodman is a 68 year old male with history of A fib, T2DM, CKD, nasopharyngeal cancer, Menetrier disease, PUD/GIB, CAD s/p CABG, COVID 23 (admitted [facility]14[facility]) who has had issues with dizziness/CP treated with IVF in ED on 10/5 and recurrent episode on 10/6. Pt had a 2 minutes witnessed tonic clonic seizure that responded to versed. He was confused at admission to [facility] and COVID test positive. CT head negative for acute changes. Plans were to transfer patient to [facility] but he developed VT v/s Afib with RVR leading to respiratory arrest treated with defibrillation and CPR --down time 11 minutes and he was intubated andtransferred to [facility] for management. He was started on broad spectrum antibiotics, amiodarone and BLE  dopplers negative for DVT. He was loaded with Keppra and EEG done showing evidence of moderate diffuse encephalopathy with excessive beta activity likely due to benzo's. He tolerated extubation to BIPAP on 10/08.Dr. Moshe Cipro consulted for acute on chronic renal failure with rise in SCr 5.17and felt that AKI likely due to ATN in setting of current illness and cardiac arrest. Metabolic acidosis improved with IV bicarb. On 10/15, he developed hypoxia with increased WOB and was started on 30L/100%HFNC and treated with IV lasix as well as steroids due to concerns of COPD exacerbation.   Dr. Vaughan Browner expressed suspicion of Covid pneumonitis due to continued high flow needs.CT chest done 10/19 revealing extensive patchy consolidation with ground glass opacity prominent in BUL suggestive of PNA, atelectasis or possible early post-inflammatory fibrosis. Incidental findings of moderate to severe centrilobar and paraseptal emphysema with suggestion of diffuse gastric wall thickening--unchanged.Due to lack of improvement in symptoms, decision was made to treat him with Remdesivir and antibiotics. He developed  progressive confusion with agitation and was intubated from 10/21 and transferred to [facility]. He developed recurrent acute on CRF with uremia and started on CRRT 10/25. He did develop hematuria due to acute cystitis--heparin d/c and started on Rocephin per Dr. Noland Fordyce input-->changed to meropenum and Linezolid for ESBL Klebsiella/VRE UTI. He was transferred back to [name] on 10/30. He has required midodrine as well as Solu Cortef for hypotension and possible adrenal insufficiency. CRRT d/c 10/29 and volume status/renal status stable without uremic features. Patient had bladder distension with clots in urine and pelvic ultrasound 11/02 revealing mild left >right hydronephrosis therefore foley placed after discussion with GU due to concerns of urinary retention. Urine output decreasing Heis now off IV heparin and transitioned to Eliquis. He will not require dialysis.Pt has been evaluated by therapies with recommendation for CIR.   Complete NIHSS TOTAL: 0  Patient's medical record from [facility] has been reviewed by the rehabilitation admission coordinator and physician.  Past Medical History      Past Medical History:  Diagnosis Date   Abdominal aneurysm ([place]) 02/2019   4 cm infrarenal/3.8 cm saccular distal abdominal aortic aneurysm.    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  Prior Rehab/Hospitalizations Has the patient had prior rehab or hospitalizations prior to admission? Yes  Has the patient had major surgery during 100 days prior to admission? Yes             Current Medications  Current Facility-Administered Medications:    acetaminophen (TYLENOL) tablet 650  mg, 650 mg, Oral, Q4H PRN, Bonnielee Haff, MD, 650 mg at 06/01/19 0856   amiodarone (PACERONE) tablet 200 mg, 200 mg, Oral, BID, Bonnielee Haff, MD, 200 mg at 06/05/19 3149   apixaban (ELIQUIS) tablet 5 mg, 5 mg, Oral, BID, Shahmehdi, Seyed A, MD, 5 mg at 06/05/19 7026   calcitRIOL (ROCALTROL) capsule 0.25 mcg, 0.25 mcg, Oral, Daily, Bonnielee Haff, MD, 0.25 mcg at 06/05/19 3785   Chlorhexidine Gluconate Cloth 2 % PADS 6 each, 6 each, Topical, Daily, Bonnielee Haff, MD, 6 each at 06/05/19 0907   diltiazem (CARDIZEM CD) 24 hr capsule 240 mg, 240 mg, Oral, Daily, Shahmehdi, Seyed A, MD, 240 mg at 06/05/19 0906   feeding supplement (PRO-STAT SUGAR FREE 64) liquid 30 mL, 30 mL, Oral, BID, Alma Friendly, MD, 30 mL at 06/05/19 0906   HYDROcodone-acetaminophen (NORCO/VICODIN) 5-325 MG per tablet 1-2 tablet, 1-2 tablet, Oral, Q4H PRN, Alma Friendly, MD, 2 tablet at 06/05/19 8850   hydrOXYzine (ATARAX/VISTARIL) tablet 25 mg, 25 mg, Oral, TID PRN, Bonnielee Haff, MD, 25 mg at 05/29/19 0006   insulin aspart (novoLOG) injection 0-5 Units, 0-5 Units, Subcutaneous, QHS, Bonnielee Haff, MD, 2 Units at 06/02/19 2116   insulin aspart (novoLOG) injection 0-9 Units, 0-9 Units, Subcutaneous, TID WC, Bonnielee Haff, MD, 1 Units at 06/04/19 1249   insulin glargine  (LANTUS) injection 12 Units, 12 Units, Subcutaneous, QHS, Bonnielee Haff, MD, 12 Units at 06/04/19 2254   ipratropium-albuterol (DUONEB) 0.5-2.5 (3) MG/3ML nebulizer solution 3 mL, 3 mL, Inhalation, BID, 3 mL at 06/05/19 0754 **OR** [DISCONTINUED] ipratropium-albuterol (DUONEB) 0.5-2.5 (3) MG/3ML nebulizer solution 3 mL, 3 mL, Nebulization, Q6H, McClung, Kimberlee Nearing, MD   lactobacillus acidophilus (BACID) tablet 2 tablet, 2 tablet, Oral, TID, Shahmehdi, Seyed A, MD, 2 tablet at 06/05/19 0910   levETIRAcetam (KEPPRA) tablet 500 mg, 500 mg, Oral, BID, Bonnielee Haff, MD, 500 mg at 06/05/19 2774   linezolid (ZYVOX) tablet 600 mg, 600 mg, Oral, Q12H, Donne Hazel, MD, 600 mg at 06/05/19 0910   Melatonin TABS 6 mg, 6 mg, Oral, QHS, Bonnielee Haff, MD, 6 mg at 06/04/19 2255   midodrine (PROAMATINE) tablet 5 mg, 5 mg, Oral, TID WC, Rosita Fire, MD, 5 mg at 06/05/19 0906   mometasone-formoterol (DULERA) 200-5 MCG/ACT inhaler 2 puff, 2 puff, Inhalation, BID, Bonnielee Haff, MD, 2 puff at 06/05/19 0754   pantoprazole (PROTONIX) EC tablet 40 mg, 40 mg, Oral, Daily, Bonnielee Haff, MD, 40 mg at 06/05/19 0906   polyethylene glycol (MIRALAX / GLYCOLAX) packet 17 g, 17 g, Oral, Daily PRN, Bonnielee Haff, MD, 17 g at 06/01/19 0858   senna (SENOKOT) tablet 8.6 mg, 1 tablet, Oral, Daily PRN, Bonnielee Haff, MD   sodium bicarbonate tablet 650 mg, 650 mg, Oral, BID, Rosita Fire, MD  Patients Current Diet:     Diet Order                  Diet - low sodium heart healthy         Diet Carb Modified Fluid consistency: Thin; Room service appropriate? Yes  Diet effective now               Precautions / Restrictions Precautions Precautions: Fall Precaution Comments: abdomen pain from UTI Restrictions Weight Bearing Restrictions: No   Has the patient had 2 or more falls or a fall with injury in the past year? 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 Drives PTA, Independent without AD PTA  Prior Functional Level Self Care: Did the patient need help bathing, dressing, using the toilet or eating? Independent  Indoor Mobility: Did the patient need assistance with walking from room to room (with or without device)? Independent  Stairs: Did the patient need assistance with internal or external stairs (with or without device)? Independent  Functional Cognition: Did the patient need help planning regular tasks such as shopping or remembering to take medications? Independent  Home Assistive Devices / Equipment Home Assistive Devices/Equipment: Hearing aid Home Equipment: Grab bars - toilet, Walker - 2 wheels  Prior Device Use: Indicate devices/aids used by the patient prior to current illness, exacerbation or injury? None of the above  Current Functional Level Cognition  Overall Cognitive Status: Within Functional Limits for tasks assessed Orientation Level: Oriented X4 General Comments: able to recall therapists from previous sessions    Extremity Assessment (includes Sensation/Coordination)  Upper Extremity Assessment: Defer to OT evaluation  Lower Extremity Assessment: Generalized weakness    ADLs  Overall ADL's : Needs assistance/impaired Eating/Feeding: Set up, Sitting Eating/Feeding Details (indicate cue type and reason): cues for pacing self during SOB episode Grooming: Set up, Sitting Upper Body Bathing: Minimal assistance, Sitting Lower Body Bathing: Maximal assistance, Sitting/lateral leans, Sit to/from stand Upper Body Dressing : Minimal assistance, Sitting Lower Body Dressing: Maximal assistance, Sitting/lateral leans, Sit to/from stand Toilet Transfer: Moderate assistance, +2 for physical assistance, +2 for safety/equipment Toilet Transfer Details (indicate cue type and reason): max A +2 to stand this date from [place]  and Hygiene: Total assistance, Sit to/from stand Toileting - Clothing Manipulation Details (indicate cue type and reason): total A to clean peri area while standing Functional mobility during ADLs: Minimal assistance(sitting EOB) General ADL Comments: mostly focused on exercise to improve BADL tolerance, cont to be limited by UTI symptoms    Mobility  Overal bed mobility: Needs Assistance Bed Mobility: Sidelying to Sit, Sit to Sidelying Rolling: Min assist Sidelying to sit: Min assist Supine to sit: Max assist Sit to supine: Min assist Sit to sidelying: Mod assist, +2 for physical assistance, +2 for safety/equipment General bed mobility comments: pt in bed/chair position for exercise 2/2 abdomen pain    Transfers  Overall transfer level: Needs assistance Equipment used: 2 person hand held assist Transfers: Sit to/from Stand Sit to Stand: Mod assist, +2 physical assistance General transfer comment: pt continues to be modAx2 for powerup to RW however was able to achieve more quickly with increased stability. pt requires modAx2 for sitting due to decreased eccentric control to lower into lower recliner    Ambulation / Gait / Stairs / Wheelchair Mobility  Ambulation/Gait Ambulation/Gait assistance: Min assist, +2 physical assistance Gait Distance (Feet): 2 Feet Assistive device: Rolling walker (2 wheeled) Gait Pattern/deviations: Step-to pattern General Gait Details: minA for steadying with stepping to recliner Gait velocity: slowed Gait velocity interpretation: <1.31 ft/sec, indicative of household ambulator    Posture / Balance Dynamic Sitting Balance Sitting balance - Comments: with initial sitting EoB dizziness but able to progress from min A for maintainence of balance to min guard with no UE support as dizziness subsided, sat EoB for 5 minutes  Balance Overall balance assessment: Needs assistance Sitting-balance support: Feet supported, Bilateral upper extremity  supported Sitting balance-Leahy Scale: Fair Sitting balance - Comments: with initial sitting EoB dizziness but able to progress from min A for maintainence of balance to min guard with no UE support as dizziness subsided,  sat EoB for 5 minutes  Postural control: Posterior lean Standing balance support: Bilateral upper extremity supported, Single extremity supported Standing balance-Leahy Scale: Fair Standing balance comment: able to progress from minA for steadying to single UE support on RW, stood for 5 minutes    Special needs/care consideration BiPAP/CPAP : no CPM : no Continuous Drip IV : Meropenem Dialysis : no        Days: no Life Vest : no Oxygen : no Special Bed : no Trach Size : no Wound Vac (area) : no      Location: no Skin : abrasion to right arm, ecchymosis to bilateral arm/hand, MASD to right buttocks, rash to sacrum, buttocks, skin tear to right forearm; Pressure injury noted on 10/26 to bilateral sacrum stage II injury; pressure injury on 10/31 to right buttocks stage 1; on 10/09, closed right neck incision; on 10/29, open or dehisced non-pressure wound to left buttocks.        Bowel mgmt: continent, last BM: 11/5 Bladder mgmt: continent, catheter in place Diabetic mgmt: yes Behavioral consideration : no Chemo/radiation : no   Previous Home Environment (from acute therapy documentation) Living Arrangements: Spouse/significant other, Children Available Help at Discharge: Family, Available 24 hours/day Type of Home: House Home Layout: Two level, Bed/bath upstairs Alternate Level Stairs-Rails: Right Alternate Level Stairs-Number of Steps: flight, 16 steps Home Access: Level entry Bathroom Shower/Tub: Tub/shower unit, Industrial/product designer: Yes Home Care Services: No  Discharge Living Setting Plans for Discharge Living Setting: Patient's home, Lives with (comment)(lives with wife and 3 adult children) Type of Home at Discharge:  House Discharge Home Layout: Two level, 1/2 bath on main level, Bed/bath upstairs Alternate Level Stairs-Rails: Right Alternate Level Stairs-Number of Steps: 16 Discharge Home Access: Level entry Discharge Bathroom Shower/Tub: Tub/shower unit Discharge Bathroom Toilet: Standard Discharge Bathroom Accessibility: Yes How Accessible: Accessible via walker Does the patient have any problems obtaining your medications?: No  Social/Family/Support Systems Patient Roles: Spouse, Parent Contact Information: wife Mardene Celeste): [phone] Anticipated Caregiver: wife + 2/3 grown kids work from home and can be present Anticipated Ambulance person Information: see above Ability/Limitations of Caregiver: Min A Caregiver Availability: 24/7 Discharge Plan Discussed with Primary Caregiver: Yes Is Caregiver In Agreement with Plan?: Yes Does Caregiver/Family have Issues with Lodging/Transportation while Pt is in Rehab?: No  Goals/Additional Needs Patient/Family Goal for Rehab: PT/OT: Supervision; SLP: NA Expected length of stay: 7-10 days Cultural Considerations: no Dietary Needs: carb modified, thin liquids; calorie level medium: 1600-2000 Equipment Needs: TBD Pt/Family Agrees to Admission and willing to participate: Yes Program Orientation Provided & Reviewed with Pt/Caregiver Including Roles  & Responsibilities: Yes(reviewed with pt and his wife)  Barriers to Discharge: Home environment access/layout  Barriers to Discharge Comments: bedroom/bathroom on 2nd level (16 steps to enter)  Decrease burden of Care through IP rehab admission: NA  Possible need for SNF placement upon discharge: Not anticipated; pt has great social support at DC and is motivated to return home after CIR stay. Pt has good prognosis for further progress through IP Rehab.   Patient Condition: I have reviewed medical records from [facility], spoken with MD, and patient and spouse. I met with patient at  the bedside for inpatient rehabilitation assessment.  Patient will benefit from ongoing PT and OT, can actively participate in 3 hours of therapy a day 5 days of the week, and can make measurable gains during the admission.  Patient will also benefit from the coordinated team approach  during an Inpatient Acute Rehabilitation admission.  The patient will receive intensive therapy as well as Rehabilitation physician, nursing, social worker, and care management interventions.  Due to bladder management, bowel management, safety, skin/wound care, disease management, medication administration, pain management and patient education the patient requires 24 hour a day rehabilitation nursing.  The patient is currently Min A +2 for 2 feet of ambulation (mod A +2 for transfers); has been limited in most recent therapy session due to abdominal pain. The patient is currently set up to [facility] for basic ADLs.  Discharge setting and therapy post discharge at home with home health is anticipated.  Patient has agreed to participate in the Acute Inpatient Rehabilitation Program and will admit 06/05/2019.  Preadmission Screen Completed By:  Raechel Ache, 06/05/2019 12:53 PM ______________________________________________________________________   Discussed status with Dr. Ranell Patrick on 06/05/2019 at 12:53PM and received approval for admission today.  Admission Coordinator:  Raechel Ache, OT, time 12:53PM/Date 06/05/2019   Assessment/Plan: Diagnosis: 1. Does the need for close, 24 hr/day Medical supervision in concert with the patient's rehab needs make it unreasonable for this patient to be served in a less intensive setting? Yes 2. Co-Morbidities requiring supervision/potential complications: Afib, Type 2 DM, nasopharyngeal cancer, Menetrier disease, PUD/GIB, CAD s/p CABG 3. Due to bladder management, bowel management, safety, skin/wound care, disease management, medication administration, pain management and patient education,  does the patient require 24 hr/day rehab nursing? Yes 4. Does the patient require coordinated care of a physician, rehab nurse, PT, OT, and SLP to address physical and functional deficits in the context of the above medical diagnosis(es)? Yes Addressing deficits in the following areas: balance, endurance, locomotion, strength, transferring, bowel/bladder control, bathing, dressing, feeding, grooming, toileting, cognition, speech, language, swallowing and psychosocial support 5. Can the patient actively participate in an intensive therapy program of at least 3 hrs of therapy 5 days a week? Yes 6. The potential for patient to make measurable gains while on inpatient rehab is fair.  7. Anticipated functional outcomes upon discharge from inpatient rehab: MinA PT, modified independent OT, independent SLP 8. Estimated rehab length of stay to reach the above functional goals is: 2 weeks 9. Anticipated discharge destination: Home 10. Overall Rehab/Functional Prognosis: fair   MD Signature: Leeroy Cha, MD        Revision History Date/Time User Provider Type Action  06/05/2019 2:19 PM Raulkar, Clide Deutscher, MD Physician Sign  06/05/2019 2:08 PM Raechel Ache, OT Rehab Admission Coordinator Share  06/05/2019 1:12 PM Ranell Patrick Clide Deutscher, MD Physician Share  06/05/2019 12:53 PM Raechel Ache, OT Rehab Admission Coordinator [facility] Details Report

## 2019-06-05 NOTE — Progress Notes (Signed)
[facility] NEPHROLOGY PROGRESS NOTE  Assessment/ Plan: Pt is a 68 y.o. yo male male with CHF, A. fib, DM, CKD with baseline creatinine around 2 admitted to [facility] from 9/14-9/19, readmitted on 10/6 status post cardiac arrest and suffered AKI on CKD required CRRT till 10/29.  #AKI on CKD, baseline creatinine around 2.  AKI likely ATN after cardiac arrest and concomitant with COVID-19 infection.  Initially treated with Lasix and then started CRRT from 10/24-10/29. -Increasing urine output with stable serum creatinine level of 2.2.  His volume status acceptable.  No uremic feature.  He does not need dialysis.  We will discontinue HD catheter.  He will need to follow outpatient with Nephrology.  #Hypokalemia: Improved.   #Metabolic acidosis: Start sodium bicarbonate.  Monitor lab.  #Acute hypoxic respiratory failure/pneumonia due to COVID-19 virus: Completed course of remdesivir and steroid.  Clinically improved.  In room air.  #Hypotension/possible adrenal insufficiency: Received Solu-Cortef tapering and midodrine.  Blood pressure is improved.  #ESBL UTI possible acute urinary retention: Meropenem since 11/1.  Urology following for some blood clot during urination.  Need outpatient follow-up with urology per intraurethral catheter.  #Acute on chronic systolic CHF: LVEF 40 to 18%.  Discussed with the primary team.  Subjective: Seen and examined at bedside.  He is feeling good.  No new event.  Urine output of 1350 cc.  Denies nausea vomiting chest pain shortness of breath.  Objective Vital signs in last 24 hours: Vitals:   06/04/19 2100 06/04/19 2300 06/05/19 0756 06/05/19 0819  BP: 123/84 123/75  126/79  Pulse: (!) 106 (!) 106 99 (!) 107  Resp:   (!) 22   Temp: 97.6 F (36.4 C) 97.8 F (36.6 C)  97.6 F (36.4 C)  TempSrc: Oral Axillary  Oral  SpO2: 93% 92% 96% (!) 89%  Weight:      Height:       Weight change:   Intake/Output Summary (Last 24  hours) at 06/05/2019 0947 Last data filed at 06/05/2019 0700 Gross per 24 hour  Intake 800.44 ml  Output 1350 ml  Net -549.56 ml       Labs: Basic Metabolic Panel: Recent Labs  Lab 06/03/19 0016 06/04/19 0337 06/05/19 0349  NA 137 142 144  K 2.8* 3.9 4.2  CL 106 110 115*  CO2 21* 21* 19*  GLUCOSE 192* 134* 140*  BUN 57* 53* 49*  CREATININE 2.42* 2.28* 2.26*  CALCIUM 7.0* 7.0* 6.8*  PHOS  --  3.3  --    Liver Function Tests: No results for input(s): AST, ALT, ALKPHOS, BILITOT, PROT, ALBUMIN in the last 168 hours. No results for input(s): LIPASE, AMYLASE in the last 168 hours. No results for input(s): AMMONIA in the last 168 hours. CBC: Recent Labs  Lab 06/01/19 0637 06/02/19 0406 06/03/19 0016 06/04/19 0337 06/05/19 0349  WBC 10.4 7.8 8.0 8.1 8.0  HGB 11.7* 10.2* 10.3* 10.6* 10.5*  HCT 38.5* 33.6* 33.5* 35.1* 34.6*  MCV 100.0 100.6* 99.7 100.9* 103.0*  PLT 126* 96* 89* 108* 127*   Cardiac Enzymes: No results for input(s): CKTOTAL, CKMB, CKMBINDEX, TROPONINI in the last 168 hours. CBG: Recent Labs  Lab 06/04/19 0745 06/04/19 1113 06/04/19 1621 06/04/19 2107 06/05/19 0816  GLUCAP 124* 145* 95 147* 106*    Iron Studies: No results for input(s): IRON, TIBC, TRANSFERRIN, FERRITIN in the last 72 hours. Studies/Results: No results found.  Medications: Infusions:   Scheduled Medications: . amiodarone  200 mg Oral BID  .  apixaban  5 mg Oral BID  . calcitRIOL  0.25 mcg Oral Daily  . Chlorhexidine Gluconate Cloth  6 each Topical Daily  . diltiazem  240 mg Oral Daily  . feeding supplement (PRO-STAT SUGAR FREE 64)  30 mL Oral BID  . insulin aspart  0-5 Units Subcutaneous QHS  . insulin aspart  0-9 Units Subcutaneous TID WC  . insulin glargine  12 Units Subcutaneous QHS  . ipratropium-albuterol  3 mL Inhalation BID  . lactobacillus acidophilus  2 tablet Oral TID  . levETIRAcetam  500 mg Oral BID  . linezolid  600 mg Oral Q12H  . Melatonin  6 mg Oral QHS   . midodrine  5 mg Oral TID WC  . mometasone-formoterol  2 puff Inhalation BID  . pantoprazole  40 mg Oral Daily  . sodium bicarbonate  650 mg Oral BID    have reviewed scheduled and prn medications.  Physical Exam: General: Not in distress, able to lie flat Heart:RRR, s1s2 nl Lungs: Clear b/l, no crackle Abdomen:soft, Non-tender, non-distended Extremities:No edema Dialysis Access: Dialysis site looks clean, we will discontinue today.  Mathis Cashman Prasad Ersel Wadleigh 06/05/2019,9:47 AM  LOS: 30 days  Pager: [phone]

## 2019-06-05 NOTE — Plan of Care (Signed)

## 2019-06-05 NOTE — H&P (Signed)
Physical Medicine and Rehabilitation Admission H&P  CC: Debility due to multiple medical issues.    HPI: Dylan Goodman is a 68 year old male with history of A fib, T2DM, CKD, nasopharyngeal cancer, Menetrier disease,  PUD/GIB, CAD s/p CABG, COVID 58 (admitted [facility] 9/14-9/19) who has had issues with dizziness/CP treated with IVF in ED on 10/5 and recurrent episode on 10/6. EMS activated and he had 2 minutes witnessed tonic clonic seizure that responded to versed. He was confused at admission to [facility] and COVID test positive. CT head negative for acute changes. Plans were to transfer patient to [facility] but he developed VT v/s Afib with RVR leading to respiratory arrest treated with defibrillation and CPR --down time 11 minutes and he was intubated and transferred to [facility] for management. He was started on broad spectrum antibiotics, amiodarone and BLE dopplers negative for DVT. He was loaded with Keppra and EEG done showing evidence of moderate diffuse encephalopathy with excessive beta activity likely due to benzo's. He tolerated extubation to BIPAP on 10/08.  Dr. Moshe Cipro consulted for acute on chronic renal failure with rise in SCr 5.17 and felt that AKI likely due to ATN in setting of current illness and cardiac arrest. Metabolic acidosis improved with IV bicarb. On 10/15, he developed hypoxia with increased WOB and was started on 30L/100%HFNC and treated with IV lasix as well as steroids due to concerns of COPD exacerbation.   Dr. Vaughan Browner expressed suspicion of Covid pneumonitis due to continued high flow needs.  CT chest done 10/19 revealing extensive patchy consolidation with ground glass opacity prominent in BUL suggestive of PNA, atelectasis or possible early post-inflammatory fibrosis. Incidental findings of moderate to severe centrilobar and paraseptal emphysema with suggestion of diffuse gastric wall thickening--unchanged.  Due to lack of improvement in symptoms, decision was made to treat him with  Remdesivir and antibiotics.  He developed progressive confusion with agitation and was intubated from 10/21 and transferred to [facility].   He developed recurrent acute on CRF with uremia and started on CRRT 10/25.  He did develop hematuria due to acute cystitis--heparin d/c and started on Rocephin per Dr. Noland Fordyce input--> changed to meropenum and Linezolid for ESBL Klebsiella/VRE UTI.   He was transferred back to [facility] on 10/30. He has required midodrine as well as Solu Cortef for hypotension and possible adrenal insufficiency.  CRRT d/c 10/29 and volume status/renal status stable without uremic features. Patient had bladder distension with clots in urine and pelvic ultrasound 11/02 revealing mild left > right hydronephrosis therefore foley placed after discussion with GU due to concerns of urinary  retention. Urine output decreasing    [name] says he is doing well today and he is eager to come to rehab. He denies pain, constipation, N/V, and reports that he is sleeping well. He is now off IV heparin and transitioned to Eliquis. He will not require dialysis.   ROS    Past Medical History:  Diagnosis Date   Abdominal aneurysm ([place]) 02/2019   4 cm infrarenal/3.8 cm saccular distal abdominal aortic aneurysm.    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   Myocardial infarction [facility]) 2017   Personal history of radiation therapy 05/30/2018   39 treatments with chemotherapy nasopharyngeal cancer   Skin cancer      basal cell/ nasal pharyngeal ca   Past Surgical History:  Procedure Laterality Date   CAROTID ENDARTERECTOMY  2006   COLONOSCOPY WITH PROPOFOL N/A 03/11/2019   Procedure: COLONOSCOPY WITH PROPOFOL;  Surgeon: Toledo, Benay Pike, MD;  Location: [facility] ENDOSCOPY;  Service: Gastroenterology;  Laterality: N/A;   CORONARY ARTERY BYPASS GRAFT  1996   quadruple bypass   ESOPHAGOGASTRODUODENOSCOPY (EGD) WITH PROPOFOL N/A 03/11/2019   Procedure: ESOPHAGOGASTRODUODENOSCOPY (EGD) WITH PROPOFOL;  Surgeon: Toledo, Benay Pike, MD;  Location: [facility] ENDOSCOPY;  Service: Gastroenterology;  Laterality: N/A;   FEMORAL-POPLITEAL BYPASS GRAFT Right 06/1998   11/2001, 08/2003   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   11 coils   LOWER EXTREMITY ANGIOGRAPHY Left 11/11/2018   Procedure: LOWER EXTREMITY ANGIOGRAPHY;  Surgeon: Katha Cabal, MD;  Location: [facility];  Service: Cardiovascular;  Laterality: Left;   REVISION OF AORTA BIFEMORAL BYPASS     Stent revascularization left leg  08/2009   Stent revascularization right leg  07/2009   Family History  Problem Relation Age of Onset   Coronary artery disease Mother    Alcohol abuse Father    Colon cancer Father    Coronary artery disease Father    Kidney cancer Father    Breast cancer Sister    Social History:  reports that he quit smoking about 2 years ago. His smoking use included cigarettes. He has a 75.00 pack-year smoking history. He has never used smokeless tobacco. He reports previous alcohol use. He reports that he does not use drugs. Allergies: No Known Allergies Medications Prior to Admission  Medication Sig Dispense Refill   acetaminophen (TYLENOL) 325 MG tablet Take 2 tablets (650 mg total) by mouth every 4 (four) hours as needed for mild pain.     Amino Acids-Protein Hydrolys (FEEDING SUPPLEMENT, PRO-STAT SUGAR FREE 64,) LIQD Take 30 mLs by mouth 2 (two) times daily. 887 mL 0   amiodarone (PACERONE) 200 MG tablet Take 1  tablet (200 mg total) by mouth 2 (two) times daily. 60 tablet 0   apixaban (ELIQUIS) 5 MG TABS tablet Take 1 tablet (5 mg total) by mouth 2 (two) times daily. 60 tablet 0   calcitRIOL (ROCALTROL) 0.25 MCG capsule Take 1 capsule (0.25 mcg total) by mouth daily. 30 capsule 0   Calcium Carbonate-Vitamin D3 (CALCIUM 600-D) 600-400 MG-UNIT TABS Take 1 tablet by mouth daily.     Chlorhexidine Gluconate Cloth 2 % PADS Apply 6 each topically daily for 10 days. 60 each 0   Cholecalciferol (VITAMIN D3) 25 MCG (1000 UT) CAPS Take by mouth.     cilostazol (PLETAL) 100 MG tablet Take 100 mg by mouth 2 (two) times daily.      diltiazem (CARDIZEM CD) 240 MG 24 hr capsule Take 1 capsule (240 mg total) by mouth daily. 30 capsule 0   HYDROcodone-acetaminophen (NORCO/VICODIN) 5-325 MG tablet Take 1-2 tablets by mouth every 4 (four) hours as needed for moderate pain. 30 tablet 0   insulin aspart (NOVOLOG) 100 UNIT/ML injection Inject 0-5 Units into the skin at bedtime. 10 mL 11   insulin aspart (NOVOLOG) 100 UNIT/ML injection Inject 0-9  Units into the skin 3 (three) times daily with meals. 10 mL 11   insulin glargine (LANTUS) 100 UNIT/ML injection Inject 0.12 mLs (12 Units total) into the skin at bedtime. 10 mL 11   ipratropium-albuterol (DUONEB) 0.5-2.5 (3) MG/3ML SOLN Inhale 3 mLs into the lungs 2 (two) times daily. 360 mL 1   lactobacillus acidophilus (BACID) TABS tablet Take 2 tablets by mouth 3 (three) times daily for 15 days. 90 tablet 0   levETIRAcetam (KEPPRA) 500 MG tablet Take 1 tablet (500 mg total) by mouth 2 (two) times daily. 60 tablet 0   linezolid (ZYVOX) 600 MG tablet Take 1 tablet (600 mg total) by mouth every 12 (twelve) hours. 10 tablet 0   lovastatin (MEVACOR) 40 MG tablet Take 1 tablet (40 mg total) by mouth at bedtime. 30 tablet 0   Melatonin 3 MG TABS Take 2 tablets (6 mg total) by mouth at bedtime. 30 tablet 0   midodrine (PROAMATINE) 5 MG tablet Take 1 tablet (5 mg total) by  mouth 3 (three) times daily with meals. 90 tablet 0   mometasone-formoterol (DULERA) 200-5 MCG/ACT AERO Inhale 2 puffs into the lungs 2 (two) times daily. 0.1 g 0   nitroGLYCERIN (NITROSTAT) 0.4 MG SL tablet Place 0.4 mg under the tongue every 5 (five) minutes as needed for chest pain.      pantoprazole (PROTONIX) 40 MG tablet Take 1 tablet (40 mg total) by mouth daily for 15 days. 15 tablet 0   polyethylene glycol (MIRALAX / GLYCOLAX) 17 g packet Take 17 g by mouth daily as needed for moderate constipation. 14 each 0   predniSONE (DELTASONE) 10 MG tablet Take 1 tablet (10 mg total) by mouth daily for 3 days, THEN 0.5 tablets (5 mg total) daily for 3 days. 3 tablet 0   sodium bicarbonate 650 MG tablet Take 1 tablet (650 mg total) by mouth 2 (two) times daily. 60 tablet 1    Drug Regimen Review  Drug regimen was reviewed and remains appropriate with no significant issues identified  Home: Home Living Family/patient expects to be discharged to:: Private residence Living Arrangements: Spouse/significant other   Functional History:    Functional Status:  Mobility:          ADL:    Cognition: Cognition Orientation Level: Oriented X4    Physical Exam: Blood pressure (!) 143/77, pulse (!) 110, temperature 97.7 F (36.5 C), resp. rate 20, height 5\' 11"  (1.803 m), weight 93 kg, SpO2 93 %. General: Alert and oriented x 3, No apparent distress, lying down in bed sleeping, easily arousable.  HEENT: Head is normocephalic, atraumatic, PERRLA, EOMI, sclera anicteric, oral mucosa pink and moist, dentition intact, ext ear canals clear,  Neck: Supple without JVD or lymphadenopathy Heart: Reg rate and rhythm. No murmurs rubs or gallops Chest: CTA bilaterally without wheezes, rales, or rhonchi; no distress, but increased work of breathing from yesterday. [place] in place.  Abdomen: Soft, non-tender, non-distended, bowel sounds positive. Extremities: No clubbing, cyanosis, or edema. Pulses  are 2+ Skin: Clean and intact without signs of breakdown Neuro/Musculoskeletal: Pt is cognitively appropriate with normal insight, memory, and awareness. Cranial nerves 2-12 are intact. Sensory exam is normal. Reflexes are 2+ in all 4's. Fine motor coordination is intact. Tremor in bilateral hands, most pronounced during muscle testing. Motor function: RUE: 5/5 EF, EE, WE, hand grip LUE: 5/5 EF, 4/5 EE, 5/5 WE and hand grip RLE: 3/5 HF, 3/5 KE, 5/5 DF/PF LLE: 3/5 HF, 2/5 KE, 5/5  DF/PF Psych: Pt's affect is appropriate. Pt is cooperative  Results for orders placed or performed during the hospital encounter of 06/05/19 (from the past 48 hour(s))  Glucose, capillary     Status: Abnormal   Collection Time: 06/05/19  5:13 PM  Result Value Ref Range   Glucose-Capillary 61 (L) 70 - 99 mg/dL  Glucose, capillary     Status: Abnormal   Collection Time: 06/05/19  5:28 PM  Result Value Ref Range   Glucose-Capillary 53 (L) 70 - 99 mg/dL  Urine culture     Status: None (Preliminary result)   Collection Time: 06/05/19  5:45 PM   Specimen: Urine, Catheterized  Result Value Ref Range   Specimen Description URINE, CATHETERIZED    Special Requests      PATIENT ON FOLLOWING ZYVOX Performed at [facility], [address] [place], [place] [address]    Culture PENDING    Report Status PENDING   Glucose, capillary     Status: Abnormal   Collection Time: 06/05/19  5:55 PM  Result Value Ref Range   Glucose-Capillary 101 (H) 70 - 99 mg/dL   No results found.     Medical Problem List and Plan: 1.  Impaired gait and mobility secondary to acute respiratory failure with hypoxemia.   -CIR 3 hours per day for at least 5 days per week.   -WBAT 2.  Antithrombotics: -DVT/anticoagulation:  Pharmaceutical: Other (comment)--Eliquis resumed 11/5  -antiplatelet therapy: N/A 3. Pain Management: well controlled 4. Mood: pleasant, cooperative  -antipsychotic agents: none 5. Neuropsych: This patient is  capable of making decisions on his own behalf. 6. Skin/Wound Care: Monitor for skin tears 7. Fluids/Electrolytes/Nutrition: Labs on 11/7 8.  Klebsiella ESBL/VRE UTI: Treated with Meropenum 1/1-11/6 and Linzeloid  Added 11/02-11/6. Repeat UA with continued bacteruria.  10. New onset seizures: On Keppra bid. : 11. Hypotension/Adrenal insufficiency: Midodrine being weaned off and continue slow Solu-cortef taper.   -Unclear etiology, worsened by CRRT 12. Acute on chronic Renal failure:  Cr now stable, nephrology recommends holding off HD 13... Acute hypoxic respiratory failure/pneumonia due to COVID-19 virus.   -saturating well on room air.  -s/p course of remdesivir and steroids, stress dose steroids due to concern for adrenal insufficiency.  -continue Solu-Cortef, wean as tolerated  -continue inhalers 14. Chronic A fib with RVR.   -asymptomatic, stable  -transitioned from Heparin gtt to Eliquis on 11/5.  -Resume metoprolol once BP has stabilized  -Continue Cardizem. 15. ESBL/VRE/UTI/possibel acute urinary retention: completed Meropenem on 11/6, on linezolid for total of 7 days.    Vicente Masson, MD 06/05/2019   I have personally performed a face to face diagnostic evaluation, including, but not limited to relevant history and physical exam findings, of this patient and developed relevant assessment and plan.  Additionally, I have reviewed and concur with the physician assistant's documentation above.  The patient's status has not changed. The original post admission physician evaluation remains appropriate, and any changes from the pre-admission screening or documentation from the acute chart are noted above.   Leeroy Cha, MD

## 2019-06-05 NOTE — Consult Note (Signed)
Physical Medicine and Rehabilitation Consult Reason for Consult: Debility due to multiple medical issues.  Referring Physician: Dr. Skipper Cliche   HPI: Dylan Goodman is a 68 year old male with history of A fib, T2DM, CKD, nasopharyngeal cancer, Menetrier disease,  PUD/GIB, CAD s/p CABG, COVID 19 (admitted [facility] 9/14-9/19) who has had issues with dizziness/CP treated with IVF in ED on 10/5 and recurrent episode on 10/6. EMS activated and he had 2 minutes witnessed tonic clonic seizure that responded to versed. He was confused at admission to [facility] and COVID test positive. CT head negative for acute changes. Plans were to transfer patient to [facility] but he developed VT v/s Afib with RVR leading to respiratory arrest treated with defibrillation and CPR --down time 11 minutes and he was intubated and transferred to [facility] for management. He was started on broad spectrum antibiotics, amiodarone and BLE dopplers negative for DVT. He was loaded with Keppra and EEG done showing evidence of moderate diffuse encephalopathy with excessive beta activity likely due to benzo's. He tolerated extubation to BIPAP on 10/08.  Dr. Moshe Cipro consulted for acute on chronic renal failure with rise in SCr 5.17 and felt that AKI likely due to ATN in setting of current illness and cardiac arrest. Metabolic acidosis improved with IV bicarb. On 10/15, he developed hypoxia with increased WOB and was started on 30L/100%HFNC and treated with IV lasix as well as steroids due to concerns of COPD exacerbation.   Dr. Vaughan Browner expressed suspicion of Covid pneumonitis due to continued high flow needs.  CT chest done 10/19 revealing extensive patchy consolidation with ground glass opacity prominent in BUL suggestive of PNA, atelectasis or possible early post-inflammatory fibrosis. Incidental findings of moderate to severe centrilobar and paraseptal emphysema with suggestion of diffuse gastric wall thickening--unchanged.  Due to lack of  improvement in symptoms, decision was made to treat him with Remdesivir and antibiotics.  He developed progressive confusion with agitation and was intubated from 10/21 and transferred to [facility].   He developed recurrent acute on CRF with uremia and started on CRRT 10/25.  He did develop hematuria due to acute cystitis--heparin d/c and started on Rocephin per Dr. Noland Fordyce input--> changed to meropenum and Linezolid for ESBL Klebsiella/VRE UTI.   He was transferred back to [facility] on 10/30. He has required midodrine as well as Solu Cortef for hypotension and possible adrenal insufficiency.  CRRT d/c 10/29 and volume status/renal status stable without uremic features. Patient had bladder distension with clots in urine and pelvic ultrasound 11/02 revealing mild left > right hydronephrosis therefore foley placed after discussion with GU due to concerns of urinary  retention. Urine output decreasing   Dylan Goodman says he is doing well today and he is eager to come to rehab. He denies pain, constipation, N/V, and reports that he is sleeping well. He continues on IV heparin.    ROS Past Medical History:  Diagnosis Date  . Abdominal aneurysm ([place]) 02/2019   4 cm infrarenal/3.8 cm saccular distal abdominal aortic aneurysm.   Marland Kitchen Atrial fibrillation ([employer])   . Bleeding gastrointestinal   . BPH (benign prostatic hyperplasia)   . CAD (coronary artery disease)    Status post CABG and PCI  . 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 Cardiomyopathy ([place])   . Chronic depression   . CKD (chronic kidney disease)   . Diabetes mellitus ([place])   . Gastric ulcer with hemorrhage   . Gastritis   .  GERD (gastroesophageal reflux disease)   . Hx of CABG 12/1994  . IDA (iron deficiency anemia)   . 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 Myocardial infarction ([place]) 2017  . Personal history of radiation therapy 05/30/2018   39 treatments with chemotherapy nasopharyngeal cancer  . Skin cancer     basal cell/ nasal pharyngeal ca   Past Surgical History:  Procedure Laterality Date  . CAROTID ENDARTERECTOMY  2006  . COLONOSCOPY WITH PROPOFOL N/A 03/11/2019   Procedure: COLONOSCOPY WITH PROPOFOL;  Surgeon: Toledo, Benay Pike, MD;  Location: [facility] ENDOSCOPY;  Service: Gastroenterology;  Laterality: N/A;  . CORONARY ARTERY BYPASS GRAFT  1996   quadruple bypass  . ESOPHAGOGASTRODUODENOSCOPY (EGD) WITH PROPOFOL N/A 03/11/2019   Procedure: ESOPHAGOGASTRODUODENOSCOPY (EGD) WITH PROPOFOL;  Surgeon: Toledo, Benay Pike, MD;  Location: [facility] ENDOSCOPY;  Service: Gastroenterology;  Laterality: N/A;  . FEMORAL-POPLITEAL BYPASS GRAFT Right 06/1998   11/2001, 08/2003  . 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 LOWER EXTREMITY ANGIOGRAPHY Left 11/11/2018   Procedure: LOWER EXTREMITY ANGIOGRAPHY;  Surgeon: Katha Cabal, MD;  Location: [place] [facility];  Service: Cardiovascular;  Laterality: Left;  . REVISION OF AORTA BIFEMORAL BYPASS    . Stent revascularization left leg  08/2009  . Stent revascularization right leg  07/2009   Family History  Problem Relation Age of Onset  . Coronary artery disease Mother   . Alcohol abuse Father   . Colon cancer Father   . Coronary artery disease Father   . Kidney cancer Father   . Breast cancer Sister    Social History:  reports that he quit smoking about 2 years ago. His smoking use included cigarettes. He has a 75.00 pack-year smoking history. He has never used smokeless tobacco. He reports previous alcohol use. He reports that he does not use drugs. Allergies: No Known Allergies Medications Prior to Admission  Medication Sig Dispense Refill  . apixaban (ELIQUIS) 5 MG TABS tablet Take 1 tablet (5 mg total) by mouth 2 (two) times daily. 60 tablet 0  . budesonide-formoterol (SYMBICORT) 160-4.5 MCG/ACT inhaler Inhale 2 puffs into the lungs 2 (two) times daily. Rinse mouth after use 1 Inhaler 12  . metoprolol tartrate (LOPRESSOR) 100 MG tablet Take 1  tablet (100 mg total) by mouth 2 (two) times daily. 60 tablet 0  . Calcium Carbonate-Vitamin D3 (CALCIUM 600-D) 600-400 MG-UNIT TABS Take 1 tablet by mouth daily.    . Cholecalciferol (VITAMIN D3) 25 MCG (1000 UT) CAPS Take by mouth.    . cilostazol (PLETAL) 100 MG tablet Take 100 mg by mouth 2 (two) times daily.     . colchicine 0.6 MG tablet Take 0.6 mg by mouth once.    . diphenoxylate-atropine (LOMOTIL) 2.5-0.025 MG tablet Take 1 tablet by mouth 4 (four) times daily as needed for diarrhea or loose stools.    . ezetimibe (ZETIA) 10 MG tablet Take 10 mg by mouth daily.     . fenofibrate 160 MG tablet Take 160 mg by mouth daily.     . Insulin Degludec (TRESIBA FLEXTOUCH) 200 UNIT/ML SOPN Inject 12 Units into the skin at bedtime.     . isosorbide mononitrate (IMDUR) 120 MG 24 hr tablet Take 120 mg by mouth daily.    . lansoprazole (PREVACID) 30 MG capsule Take 30 mg by mouth every morning.     Marland Kitchen losartan (COZAAR) 25 MG tablet Take 25 mg by mouth daily.     . meclizine (ANTIVERT) 12.5 MG tablet  Take 1 tablet (12.5 mg total) by mouth 3 (three) times daily as needed for dizziness. 30 tablet 0  . nitroGLYCERIN (NITROSTAT) 0.4 MG SL tablet Place 0.4 mg under the tongue every 5 (five) minutes as needed for chest pain.     . sodium bicarbonate 650 MG tablet Take 1 tablet by mouth daily.     . ticagrelor (BRILINTA) 60 MG TABS tablet Take 60 mg by mouth 2 (two) times daily.     . [DISCONTINUED] lovastatin (MEVACOR) 40 MG tablet Take 40 mg by mouth 2 (two) times daily.       Home: Home Living Family/patient expects to be discharged to:: Private residence Living Arrangements: Spouse/significant other, Children Available Help at Discharge: Family, Available 24 hours/day Type of Home: House Home Access: Level entry Home Layout: Two level, Bed/bath upstairs Alternate Level Stairs-Number of Steps: flight, 16 steps Alternate Level Stairs-Rails: Right Bathroom Shower/Tub: Tub/shower unit, Artist: Standard Bathroom Accessibility: Yes Home Equipment: Grab bars - toilet, Walker - 2 wheels  Functional History: Prior Function Level of Independence: Independent Comments: reports dizzy spells, only used RW to walk to ambulate, normally does not use Functional Status:  Mobility: Bed Mobility Overal bed mobility: Needs Assistance Bed Mobility: Sidelying to Sit, Sit to Sidelying Rolling: Min assist Sidelying to sit: Min assist Supine to sit: Max assist Sit to supine: Min assist Sit to sidelying: Mod assist, +2 for physical assistance, +2 for safety/equipment General bed mobility comments: pt in bed/chair position for exercise 2/2 abdomen pain Transfers Overall transfer level: Needs assistance Equipment used: 2 person hand held assist Transfers: Sit to/from Stand Sit to Stand: Mod assist, +2 physical assistance General transfer comment: pt continues to be modAx2 for powerup to RW however was able to achieve more quickly with increased stability. pt requires modAx2 for sitting due to decreased eccentric control to lower into lower recliner Ambulation/Gait Ambulation/Gait assistance: Min assist, +2 physical assistance Gait Distance (Feet): 2 Feet Assistive device: Rolling walker (2 wheeled) Gait Pattern/deviations: Step-to pattern General Gait Details: minA for steadying with stepping to recliner Gait velocity: slowed Gait velocity interpretation: <1.31 ft/sec, indicative of household ambulator    ADL: ADL Overall ADL's : Needs assistance/impaired Eating/Feeding: Set up, Sitting Eating/Feeding Details (indicate cue type and reason): cues for pacing self during SOB episode Grooming: Set up, Sitting Upper Body Bathing: Minimal assistance, Sitting Lower Body Bathing: Maximal assistance, Sitting/lateral leans, Sit to/from stand Upper Body Dressing : Minimal assistance, Sitting Lower Body Dressing: Maximal assistance, Sitting/lateral leans, Sit to/from stand Toilet  Transfer: Moderate assistance, +2 for physical assistance, +2 for safety/equipment Toilet Transfer Details (indicate cue type and reason): max A +2 to stand this date from [place] and Hygiene: Total assistance, Sit to/from stand Toileting - Clothing Manipulation Details (indicate cue type and reason): total A to clean peri area while standing Functional mobility during ADLs: Minimal assistance(sitting EOB) General ADL Comments: mostly focused on exercise to improve BADL tolerance, cont to be limited by UTI symptoms  Cognition: Cognition Overall Cognitive Status: Within Functional Limits for tasks assessed Orientation Level: Oriented X4 Cognition Arousal/Alertness: Awake/alert Behavior During Therapy: WFL for tasks assessed/performed Overall Cognitive Status: Within Functional Limits for tasks assessed General Comments: able to recall therapists from previous sessions  Blood pressure 126/79, pulse (!) 107, temperature 97.6 F (36.4 C), temperature source Oral, resp. rate (!) 22, height 5\' 11"  (1.803 m), weight 87.7 kg, SpO2 (!) 89 %. Physical Exam  Results for orders placed or performed  during the hospital encounter of 05/06/19 (from the past 24 hour(s))  Glucose, capillary     Status: Abnormal   Collection Time: 06/04/19 11:13 AM  Result Value Ref Range   Glucose-Capillary 145 (H) 70 - 99 mg/dL  Glucose, capillary     Status: None   Collection Time: 06/04/19  4:21 PM  Result Value Ref Range   Glucose-Capillary 95 70 - 99 mg/dL  Glucose, capillary     Status: Abnormal   Collection Time: 06/04/19  9:07 PM  Result Value Ref Range   Glucose-Capillary 147 (H) 70 - 99 mg/dL  CBC     Status: Abnormal   Collection Time: 06/05/19  3:49 AM  Result Value Ref Range   WBC 8.0 4.0 - 10.5 K/uL   RBC 3.36 (L) 4.22 - 5.81 MIL/uL   Hemoglobin 10.5 (L) 13.0 - 17.0 g/dL   HCT 34.6 (L) 39.0 - 52.0 %   MCV 103.0 (H) 80.0 - 100.0 fL   MCH 31.3 26.0 - 34.0 pg   MCHC  30.3 30.0 - 36.0 g/dL   RDW 20.3 (H) 11.5 - 15.5 %   Platelets 127 (L) 150 - 400 K/uL   nRBC 0.0 0.0 - 0.2 %  Basic metabolic panel     Status: Abnormal   Collection Time: 06/05/19  3:49 AM  Result Value Ref Range   Sodium 144 135 - 145 mmol/L   Potassium 4.2 3.5 - 5.1 mmol/L   Chloride 115 (H) 98 - 111 mmol/L   CO2 19 (L) 22 - 32 mmol/L   Glucose, Bld 140 (H) 70 - 99 mg/dL   BUN 49 (H) 8 - 23 mg/dL   Creatinine, Ser 2.26 (H) 0.61 - 1.24 mg/dL   Calcium 6.8 (L) 8.9 - 10.3 mg/dL   GFR calc non Af Amer 29 (L) >60 mL/min   GFR calc Af Amer 34 (L) >60 mL/min   Anion gap 10 5 - 15  Glucose, capillary     Status: Abnormal   Collection Time: 06/05/19  8:16 AM  Result Value Ref Range   Glucose-Capillary 106 (H) 70 - 99 mg/dL   No results found.  Physical Exam:  General: Alert and oriented x 3, No apparent distress HEENT: Head is normocephalic, atraumatic, PERRLA, EOMI, sclera anicteric, oral mucosa pink and moist, dentition intact, ext ear canals clear,  Neck: Supple without JVD or lymphadenopathy Heart: Reg rate and rhythm. No murmurs rubs or gallops Chest: CTA bilaterally without wheezes, rales, or rhonchi; no distress Abdomen: Soft, non-tender, non-distended, bowel sounds positive. Extremities: No clubbing, cyanosis, or edema. Pulses are 2+ Skin: Clean and intact without signs of breakdown Neuro/Musculoskeletal: Pt is cognitively appropriate with normal insight, memory, and awareness. Cranial nerves 2-12 are intact. Sensory exam is normal. Reflexes are 2+ in all 4's. Fine motor coordination is intact. Tremor in bilateral hands, most pronounced during muscle testing. Motor function: RUE: 5/5 EF, EE, WE, hand grip LUE: 5/5 EF, 4/5 EE, 5/5 WE and hand grip RLE: 3/5 HF, 3/5 KE, 5/5 DF/PF LLE: 3/5 HF, 2/5 KE, 5/5 DF/PF Psych: Pt's affect is appropriate. Pt is cooperative  Assessment/Plan: Dylan Goodman is a 68 year old male with history of A fib, T2DM, CKD, nasopharyngeal cancer,  Menetrier disease,  PUD/GIB, CAD s/p CABG, COVID 61 (admitted [facility] 9/14-9/19) who has had issues with dizziness/CP treated with IVF in ED on 10/5 and recurrent episode on 10/6, currently with acute respiratory failure with hypoxemia and impaired mobility and ADLs.  --Dylan Goodman would be an excellent candidate for acute rehabilitation. He will be able to tolerate 3H of PT/OT per day. We are awaiting him to be off his heparin drip and transitioned to oral anticoagulation and for him to have a dialysis plan. --Denies pain, constipation, insomnia.   Vicente Masson, MD 06/05/2019

## 2019-06-05 NOTE — Progress Notes (Signed)
Inpatient Rehabilitation-Admissions Coordinator   [facility] has received medical clearance for admit to CIR today. Pt is aware and wants to proceed with IP Rehab. I have reviewed insurance letter and he signed consent forms. AC to update RN, CM/SW regarding plan to admit today.   Please call if questions.   Jhonnie Garner, OTR/L  Rehab Admissions Coordinator  [phone] 06/05/2019 2:07 PM

## 2019-06-05 NOTE — Discharge Summary (Addendum)
Physician Discharge Summary [facility]    Patient: Dylan Goodman                   Admit date: 05/06/2019   DOB: 03/23/1951             Discharge date:06/05/2019/10:56 AM YBO:[id number]                          PCP: Steele Sizer, MD  Disposition: CIR  Recommendations for Outpatient Follow-up:    Follow up: in 1 week  Discharge Condition: Stable   Code Status:   Code Status: Full Code  Diet recommendation: Diabetic diet   Discharge Diagnoses:    Principal Problem:   Acute respiratory failure with hypoxemia ([place]) Active Problems:   GERD (gastroesophageal reflux disease)   Hypomagnesemia   Acute kidney injury superimposed on chronic kidney disease ([place])   Atrial fibrillation with RVR ([place])   COVID-19 virus infection   Respiratory failure, acute ([place])   Cardiac arrest ([place])   Prolonged QT interval   Seizure ([place])   Acute respiratory disease due to COVID-19 virus   Hypoxia   Pressure injury of skin   Acute pulmonary edema ([place])   History of Present Illness/ Hospital Course Kathleen Argue Summary:    Brief Narrative:  68 year old with a history of atrial fibrillation, GIB, CAD status post CABG, nasopharyngeal cancer, CKD, DM, and GERD who was previously treated for Covid pneumonia at [facility] and discharged on 9/19. On 10/6, returned to the [facility] ED with reported seizure-like behavior, and subsequently suffered a cardiac arrest. He was intubated and transferred to [facility] ICU. Patient was extubated and subsequently intubated again on 10/21 and transferred to [facility], extubated on 10/24.  Patient was transferred to [facility] to [facility] for possible intermittent hemodialysis and CIR evaluation.  Since admission patient has been initiated on intermittent hemodialysis.  CIR has accepted the patient. Patient remains on heparin drip anticipating will be switched    ------------------------------------------------------------------------------------------------------------------------------------------- Discharge summary/detail Assessment & Plan during hospitalization   Principal Problem:   Acute respiratory failure with hypoxemia (HCC) Active Problems:   GERD (gastroesophageal reflux disease)   Hypomagnesemia   Acute kidney injury superimposed on chronic kidney disease (HCC)   Atrial fibrillation with RVR ([place])   COVID-19 virus infection   Respiratory failure, acute (HCC)   Cardiac arrest (HCC)   Prolonged QT interval   Seizure (HCC)   Acute respiratory disease due to COVID-19 virus   Hypoxia   Pressure injury of skin   Acute pulmonary edema (HCC)  Acute hypoxic respiratory failure/pneumonia due to COVID-19 virus -Patient remained stable, currently afebrile, normotensive on room air -Much improved from respiratory stress  -Patient has 2+ Covid test on 04/12/2019, and 05/04/2019, repeat test done on 05/30/2019 is negative -Currently saturating above 90% on room air -Patient has completed course of remdesivir and steroids, however on stress dose steroids due to concern for adrenal insufficiency -Continue Solu-Cortef, continue to wean as tolerated Continue inhalers  Hypotension/possible adrenal insufficiency -Blood pressure continue to stabilize -Unclear etiology for hypotension, ?? Worsened by CRRT -Remains off pressors -Continued on midodrine per Nephrology and continue Solu-Cortef with slow taper  Chronic A. fib with RVR -Remains asymptomatic -Overall stable at this time -Echo done on 10/9 showed EF of 45 to 50%, improved from previous on 03/27/2019 which was 35 to 40% -Patient had been followed by cardiology, Cardiology has since signed off -Patient started on heparin  gtt. we have monitor for bleeding, hematuria has improved he has been now stabilized, will resume back his oral anticoagulation  -Eliquis.  -Please resume  metoprolol soon as blood pressure is stabilized -Continue Cardizem as prescribed   ESBL/VRE UTI/possible acute urinary retention -Hematuria has improved -On admission he has had significant hematuria, dysuria, bladder distention -Currently afebrile withnoleukocytosis -UA showed large hemoglobin, many bacteria, 6-10 WBC -UC grew ESBL Klebsiella, VRE -Ultrasound done on 06/01/2019 showed mild left greater than right hydronephrosis, medical renal disease  -Was on ceftriaxone empirically, will switch to Meropenem completed a course 06/05/2019.  On  linezolid--for total of 7 days -Discussed with ID curbside consult recommended if remains asymptomatic current course of antibiotics will be sufficient. -Discussed with urology Dr. Claudia Desanctis on 06/01/2019, recommend continuing treatment for UTI, placing Foley catheter, no further interventions right now due to UTI -Seems stable  Diffuse ST depression noted on EKG -Remains asymptomatic -Cardiology had signed off per above  Acute on chronic systolic HF -Appears euvolemic at this time -Echo showed EF of 45 to 50% on 10/9 -Volume was managed by CRRT, on intermittent HD per Nephrology  AKI -Was on CRRT, nephrology on board -Plan for intermittent HD on hold for now as creatinine is stable, holding off on HD for now per Nephrology  History of carotid artery disease/peripheral vascular disease -Was on hold: Pletal, Brilinta as well as Eliquis   (resumed Eliquis and Pletal,--monitor for bleeding, progressive hematuria) Was  on heparin drip >>> DC'd and switched  Back to  to Eliquis   Single episode of seizure -No further episodes -Continue Keppra  Diabetes mellitus type 2 -Last A1c 6.7 -SSI, Lantus, Accu-Cheks, hypoglycemic protocol  GERD -Continue PPI  Deconditioning -PT/OT -CIR recommended   Code Status: Full Family Communication: Pt in room, family at bedside Disposition Plan: CIR  Consultants:    PCCM  Nephrology  Urology  Procedures:   Mechanical ventilation as mentioned above   Discharge Instructions:   Discharge Instructions    Activity as tolerated - No restrictions   Complete by: As directed    Call MD for:  temperature >100.4   Complete by: As directed    Diet - low sodium heart healthy   Complete by: As directed    Discharge instructions   Complete by: As directed    Resuming apixaban and antiplatelet therapy-monitor for bleeding, hematuria Holding metoprolol for now due to hypotension once improved need to be resumed Current medication needs to be monitored closely and modified as he improves  Due to complicated UTI, ID recommended short-term of Zyvox as prescribed if he remains asymptomatic needs to be DC'd after course of antibiotic completed.   Increase activity slowly   Complete by: As directed        Medication List    STOP taking these medications   budesonide-formoterol 160-4.5 MCG/ACT inhaler Commonly known as: Symbicort   colchicine 0.6 MG tablet   diphenoxylate-atropine 2.5-0.025 MG tablet Commonly known as: LOMOTIL   ezetimibe 10 MG tablet Commonly known as: ZETIA   fenofibrate 160 MG tablet   isosorbide mononitrate 120 MG 24 hr tablet Commonly known as: IMDUR   lansoprazole 30 MG capsule Commonly known as: PREVACID   losartan 25 MG tablet Commonly known as: COZAAR   meclizine 12.5 MG tablet Commonly known as: ANTIVERT   metoprolol tartrate 100 MG tablet Commonly known as: LOPRESSOR   ticagrelor 60 MG Tabs tablet Commonly known as: [place] FlexTouch 200 UNIT/ML Sopn Generic drug:  Insulin Degludec     TAKE these medications   acetaminophen 325 MG tablet Commonly known as: TYLENOL Take 2 tablets (650 mg total) by mouth every 4 (four) hours as needed for mild pain.   amiodarone 200 MG tablet Commonly known as: PACERONE Take 1 tablet (200 mg total) by mouth 2 (two) times daily.   apixaban 5 MG Tabs  tablet Commonly known as: ELIQUIS Take 1 tablet (5 mg total) by mouth 2 (two) times daily.   calcitRIOL 0.25 MCG capsule Commonly known as: ROCALTROL Take 1 capsule (0.25 mcg total) by mouth daily.   Calcium 600-D 600-400 MG-UNIT Tabs Generic drug: Calcium Carbonate-Vitamin D3 Take 1 tablet by mouth daily.   Chlorhexidine Gluconate Cloth 2 % Pads Apply 6 each topically daily for 10 days.   cilostazol 100 MG tablet Commonly known as: PLETAL Take 100 mg by mouth 2 (two) times daily.   diltiazem 240 MG 24 hr capsule Commonly known as: CARDIZEM CD Take 1 capsule (240 mg total) by mouth daily.   feeding supplement (PRO-STAT SUGAR FREE 64) Liqd Take 30 mLs by mouth 2 (two) times daily.   HYDROcodone-acetaminophen 5-325 MG tablet Commonly known as: NORCO/VICODIN Take 1-2 tablets by mouth every 4 (four) hours as needed for moderate pain.   insulin aspart 100 UNIT/ML injection Commonly known as: novoLOG Inject 0-5 Units into the skin at bedtime.   insulin aspart 100 UNIT/ML injection Commonly known as: novoLOG Inject 0-9 Units into the skin 3 (three) times daily with meals.   insulin glargine 100 UNIT/ML injection Commonly known as: LANTUS Inject 0.12 mLs (12 Units total) into the skin at bedtime.   ipratropium-albuterol 0.5-2.5 (3) MG/3ML Soln Commonly known as: DUONEB Inhale 3 mLs into the lungs 2 (two) times daily.   lactobacillus acidophilus Tabs tablet Take 2 tablets by mouth 3 (three) times daily for 15 days.   levETIRAcetam 500 MG tablet Commonly known as: KEPPRA Take 1 tablet (500 mg total) by mouth 2 (two) times daily.   linezolid 600 MG tablet Commonly known as: ZYVOX Take 1 tablet (600 mg total) by mouth every 12 (twelve) hours.   lovastatin 40 MG tablet Commonly known as: MEVACOR Take 1 tablet (40 mg total) by mouth at bedtime. What changed: when to take this   Melatonin 3 MG Tabs Take 2 tablets (6 mg total) by mouth at bedtime.   midodrine 5 MG  tablet Commonly known as: PROAMATINE Take 1 tablet (5 mg total) by mouth 3 (three) times daily with meals.   mometasone-formoterol 200-5 MCG/ACT Aero Commonly known as: DULERA Inhale 2 puffs into the lungs 2 (two) times daily.   nitroGLYCERIN 0.4 MG SL tablet Commonly known as: NITROSTAT Place 0.4 mg under the tongue every 5 (five) minutes as needed for chest pain.   pantoprazole 40 MG tablet Commonly known as: PROTONIX Take 1 tablet (40 mg total) by mouth daily for 15 days.   polyethylene glycol 17 g packet Commonly known as: MIRALAX / GLYCOLAX Take 17 g by mouth daily as needed for moderate constipation.   predniSONE 10 MG tablet Commonly known as: DELTASONE Take 1 tablet (10 mg total) by mouth daily for 3 days, THEN 0.5 tablets (5 mg total) daily for 3 days. Start taking on: June 05, 2019   sodium bicarbonate 650 MG tablet Take 1 tablet (650 mg total) by mouth 2 (two) times daily. What changed: when to take this   Vitamin D3 25 MCG (1000 UT) Caps Take by mouth.  No Known Allergies   Procedures /Studies:   Dg Abdomen 1 View  Result Date: 05/06/2019 CLINICAL DATA:  Nasogastric tube placement. EXAM: ABDOMEN - 1 VIEW COMPARISON:  None. FINDINGS: Nasogastric tube terminates just beyond the gastroesophageal junction with the side port in the lower esophagus. IMPRESSION: Nasogastric tube terminates just beyond the gastroesophageal junction, with the side port in the distal esophagus. Advancing approximately 10-15 cm would better position the tip within the stomach. Electronically Signed   By: Lorin Picket M.D.   On: 05/06/2019 13:41   US Renal  Result Date: 06/01/2019 CLINICAL DATA:  Hematuria. EXAM: RENAL / URINARY TRACT ULTRASOUND COMPLETE COMPARISON:  Renal ultrasound dated April 13, 2019. FINDINGS: Right Kidney: Renal measurements: 11.8 x 6.8 x 5.8 cm = volume: 243 mL. Mildly increased echogenicity. Mild hydronephrosis. No mass visualized. Left Kidney:  Renal measurements: 12.8 x 6.9 x 6.2 cm = volume: 287 mL. Mildly increased echogenicity. Mild hydronephrosis. No mass visualized. Bladder: Appears normal for degree of bladder distention. Other: None. IMPRESSION: 1. Mild left greater than right hydronephrosis. 2. Mildly increased bilateral renal echogenicity, consistent with underlying medical renal disease. Electronically Signed   By: Titus Dubin M.D.   On: 06/01/2019 11:52   Ct Chest High Resolution  Result Date: 05/18/2019 CLINICAL DATA:  Inpatient. Hospitalization for COVID-19 pneumonia 04/12/2019. Persistent respiratory distress and readmitted with cardiac arrest. EXAM: CT CHEST WITHOUT CONTRAST TECHNIQUE: Multidetector CT imaging of the chest was performed following the standard protocol without intravenous contrast. High resolution imaging of the lungs, as well as inspiratory and expiratory imaging, was performed. COMPARISON:  05/14/2019 chest radiograph. 03/24/2019 high-resolution chest CT. FINDINGS: Cardiovascular: Mild cardiomegaly. No significant pericardial effusion/thickening. Three-vessel coronary atherosclerosis status post CABG. Atherosclerotic nonaneurysmal thoracic aorta. Dilated main pulmonary artery (3.5 cm diameter), increased from 3.1 cm on 03/24/2019 chest CT. Mediastinum/Nodes: No discrete thyroid nodules. Unremarkable esophagus. No axillary adenopathy. Stable mildly enlarged 1.2 cm subcarinal node (series 13/image 75). No new pathologically enlarged mediastinal nodes. No discrete hilar adenopathy on this noncontrast scan. Lungs/Pleura: No pneumothorax. No pleural effusion. Small dependent bilateral pleural effusions. Moderate to severe centrilobular and paraseptal emphysema with diffuse bronchial wall thickening. There is extensive patchy consolidation, reticulation and ground-glass opacity throughout both lungs involving all lung lobes, most prominent in the upper lobes, with a strong dependent distribution, new since 03/24/2019  chest CT. There is associated volume loss and mild architectural distortion, without significant regions of traction bronchiectasis or frank honeycombing. No significant lobular air trapping on expiration sequence. No evidence of tracheobronchomalacia. No lung masses or significant pulmonary nodules. Upper abdomen: Simple 1.8 cm superior right liver cyst. Suggestion of diffuse gastric wall thickening in the nondistended stomach, not appreciably changed. Musculoskeletal: No aggressive appearing focal osseous lesions. Mild to moderate gynecomastia, asymmetric to the left, unchanged. Mild thoracic spondylosis. Intact sternotomy wires. IMPRESSION: 1. Cardiomegaly. Small dependent bilateral pleural effusions. Dilated main pulmonary artery, suggesting pulmonary arterial hypertension, increased. 2. Extensive patchy consolidation, reticulation and ground-glass opacity throughout both lungs, most prominent in the upper lobes, with a strong dependent distribution, new since 03/24/2019 chest CT. Associated volume loss and mild architectural distortion. No significant bronchiectasis or frank honeycombing. Findings suggest a combination pneumonia (including possible aspiration pneumonia given dependent distribution), atelectasis and possibly early postinflammatory fibrosis. Suggest high-resolution chest CT follow-up in 3-6 months. 3. Moderate to severe centrilobular and paraseptal emphysema with diffuse bronchial wall thickening, suggesting underlying COPD. 4. Stable mild mediastinal lymphadenopathy, nonspecific, most likely reactive. 5. Suggestion of diffuse gastric wall thickening in  the nondistended stomach, not appreciably changed since 03/24/2019 CT and nonspecific. Upper endoscopic correlation may be considered. Aortic Atherosclerosis (ICD10-I70.0) and Emphysema (ICD10-J43.9). Electronically Signed   By: Ilona Sorrel M.D.   On: 05/18/2019 13:54   Dg Chest Port 1 View  Result Date: 05/23/2019 CLINICAL DATA:  pneunmonia  due to COVID-19. EXAM: PORTABLE CHEST 1 VIEW COMPARISON:  05/20/2019 FINDINGS: Stable position of ET tube, enteric tube, and right IJ catheter. Previous median sternotomy and CABG. Normal heart size. Interval improvement in bilateral interstitial and patchy airspace opacities. No pleural effusion identified. IMPRESSION: 1. Interval improvement in bilateral interstitial and airspace opacities compatible with resolving pneumonia. 2. Stable support apparatus. Electronically Signed   By: Kerby Moors M.D.   On: 05/23/2019 06:43   Dg Chest Port 1 View  Result Date: 05/20/2019 CLINICAL DATA:  Acute respiratory failure. COVID-19. EXAM: PORTABLE CHEST 1 VIEW 1:23 p.m. COMPARISON:  05/20/2019 at 9:40 a.m. and CT scan of the chest dated 05/18/2019 FINDINGS: Endotracheal tube tip is in good position 4 cm above the carina. Right-sided central line tip is in the superior vena cava just below the carina in good position. NG tube tip is below the diaphragm. The hazy bilateral peripheral infiltrates are improved since the study earlier today. Small bilateral effusions as demonstrated on the prior CT scan. Heart size is normal. No significant bone abnormality. Aortic atherosclerosis. IMPRESSION: 1. ET tube, NG tube, and right central line are in good position. 2. Improved bilateral pulmonary infiltrates. 3. Small bilateral pleural effusions. 4. Aortic atherosclerosis. Electronically Signed   By: Lorriane Shire M.D.   On: 05/20/2019 13:50   Dg Chest Port 1 View  Result Date: 05/20/2019 CLINICAL DATA:  Acute respiratory failure with hypoxia, COVID-19 EXAM: PORTABLE CHEST 1 VIEW COMPARISON:  05/14/2019 FINDINGS: Patchy bilateral airspace disease again noted, left greater than right. Slight increase in the right lower lung since prior study. Cardiomegaly. Prior median sternotomy. Small bilateral effusions again noted, unchanged. IMPRESSION: Patchy bilateral airspace opacities, left greater than right. This is increased in  the right lower lung since prior study. Stable small effusions. Electronically Signed   By: Rolm Baptise M.D.   On: 05/20/2019 10:20   Dg Chest Port 1 View  Result Date: 05/14/2019 CLINICAL DATA:  Hypoxia. EXAM: PORTABLE CHEST 1 VIEW COMPARISON:  05/13/2019.  05/06/2019.  04/13/2019. FINDINGS: Prior median sternotomy. Stable cardiomegaly. Persistent patchy bilateral pulmonary infiltrates. Worsening of the left upper lung infiltrate noted on today's exam. Underlying chronic interstitial disease most likely present. Small bilateral pleural effusions. No pneumothorax. IMPRESSION: 1. Persistent patchy bilateral pulmonary infiltrates. Worsening of the left upper lung infiltrate noted on today's exam. Underlying chronic interstitial lung disease most likely present. Bilateral pleural effusions are unchanged. 2.  Prior median sternotomy.  Stable cardiomegaly. Electronically Signed   By: Marcello Moores  Register   On: 05/14/2019 07:16   Dg Chest Port 1 View  Result Date: 05/13/2019 CLINICAL DATA:  Hypoxia EXAM: PORTABLE CHEST 1 VIEW COMPARISON:  05/07/2019 FINDINGS: Interval extubation. Patchy bilateral airspace disease slightly increased since prior study. Heart is borderline in size. Small bilateral pleural effusions. IMPRESSION: Worsening patchy bilateral airspace opacities concerning for pneumonia. Small bilateral effusions. Electronically Signed   By: Rolm Baptise M.D.   On: 05/13/2019 11:42   Dg Chest Port 1 View  Result Date: 05/07/2019 CLINICAL DATA:  Endotracheal tube and central line placement. EXAM: PORTABLE CHEST 1 VIEW COMPARISON:  Radiograph of May 06, 2019. FINDINGS: Stable cardiomediastinal silhouette. Endotracheal and nasogastric tubes are  unchanged in position. Right internal jugular catheter is unchanged with distal tip in expected position of the SVC. No pneumothorax is noted. Stable bibasilar opacities are noted. No pleural effusion is noted. Bony thorax unremarkable. IMPRESSION: Grossly stable  support apparatus. Stable bibasilar opacities as described above. Electronically Signed   By: Marijo Conception M.D.   On: 05/07/2019 07:49   Dg Chest Port 1 View  Result Date: 05/06/2019 CLINICAL DATA:  Check endotracheal tube placement EXAM: PORTABLE CHEST 1 VIEW COMPARISON:  05/06/2019 FINDINGS: Endotracheal tube, gastric catheter and right jugular central line are noted in satisfactory position. No pneumothorax is noted. Patchy bilateral infiltrates are again seen but slightly improved when compared with the prior study. No sizable effusion is noted. No bony abnormality is seen. IMPRESSION: No pneumothorax following central line placement. Tubes and lines as described above. Bilateral patchy infiltrates slightly improved from the prior study. Electronically Signed   By: Inez Catalina M.D.   On: 05/06/2019 19:46   Dg Chest Portable 1 View  Result Date: 05/06/2019 CLINICAL DATA:  Endotracheal tube placement. EXAM: PORTABLE CHEST 1 VIEW COMPARISON:  05/05/2019 and 05/04/2019. FINDINGS: Endotracheal tube terminates 5.3 cm above the carina. Nasogastric tube is followed into the distal esophagus with the tip projecting beyond the inferior margin of the image. Heart is enlarged, stable. Thoracic aorta is calcified. Apparent worsening in mixed interstitial and airspace opacification may be due in part to lower lung volumes on the current study. Apparent lucency in the periphery of the upper right hemithorax is unchanged from 05/04/2019 and may be due to bullous emphysema. IMPRESSION: 1. Endotracheal tube is in satisfactory position. 2. Apparent worsening in mixed interstitial and airspace opacification may be due to expiratory phase imaging versus worsening pulmonary edema. Electronically Signed   By: Lorin Picket M.D.   On: 05/06/2019 13:39   Dg Abd Portable 1v  Result Date: 05/20/2019 CLINICAL DATA:  OG tube placement.  Acute respiratory failure. EXAM: PORTABLE ABDOMEN - 1 VIEW COMPARISON:  Abdominal  radiograph dated 05/06/2019 FINDINGS: NG tube tip is in the body of the stomach. No dilated bowel. Small bilateral pleural effusions. IMPRESSION: NG tube tip in the body of the stomach. Electronically Signed   By: Lorriane Shire M.D.   On: 05/20/2019 13:51   Vas [place] Lower Extremity Venous (dvt)  Result Date: 05/07/2019  Lower Venous Study Indications: COVID positive.  Limitations: Posterior acoustic shadowing from arterial calcification. Comparison Study: No prior study. Performing Technologist: Maudry Mayhew MHA, RDMS, RVT, RDCS  Examination Guidelines: A complete evaluation includes B-mode imaging, spectral Doppler, color Doppler, and power Doppler as needed of all accessible portions of each vessel. Bilateral testing is considered an integral part of a complete examination. Limited examinations for reoccurring indications may be performed as noted.  +---------+ evidence of deep vein thrombosis in the lower extremity. However, portions of this examination were limited- see technologist comments above. No cystic structure found in the popliteal fossa. Left: There is no evidence of deep vein thrombosis in the lower extremity. However, portions of this examination were limited- see technologist comments above. No cystic structure found in the popliteal fossa.  *See table(s) above for measurements and observations. Electronically signed by Monica Martinez MD on 05/07/2019 at 5:07:11 PM.    Final      Subjective:   Patient was seen and examined 06/05/2019, 10:56 AM Patient stable today. No acute distress.  No issues overnight Stable for discharge.  Discharge Exam:    Vitals:   06/04/19  2100 06/04/19 2300 06/05/19 0756 06/05/19 0819  BP: 123/84 123/75  126/79  Pulse: (!) 106 (!) 106 99 (!) 107  Resp:   (!) 22   Temp: 97.6 F (36.4 C) 97.8 F (36.6 C)  97.6 F (36.4 C)  TempSrc: Oral Axillary  Oral  SpO2: 93% 92% 96% (!) 89%  Weight:      Height:        General: Pt lying comfortably in  bed & appears in no obvious distress. Cardiovascular: S1 & S2 heard, RRR, S1/S2 +. No murmurs, rubs, gallops or clicks. No JVD or pedal edema. Respiratory: Clear to auscultation without wheezing, rhonchi or crackles. No increased work of breathing. Abdominal:  Non-distended, non-tender & soft. No organomegaly or masses appreciated. Normal bowel sounds heard. CNS: Alert and oriented. No focal deficits. Extremities: no edema, no cyanosis    The results of significant diagnostics from this hospitalization (including imaging, microbiology, ancillary and laboratory) are listed below for reference.      Microbiology:   Recent Results (from the past 240 hour(s))  Culture, Urine     Status: Abnormal   Collection Time: 05/29/19  3:14 PM   Specimen: Urine, Random  Result Value Ref Range Status   Specimen Description   Final    URINE, RANDOM Performed at [facility], [address] [place], [place] [address]    Special Requests   Final    NONE Performed at [facility], [place] [address], [place], [place] [address]    Culture (A)  Final    >=100,000 COLONIES/mL KLEBSIELLA PNEUMONIAE Confirmed Extended Spectrum Beta-Lactamase Producer (ESBL).  In bloodstream infections from ESBL organisms, carbapenems are preferred over piperacillin/tazobactam. They are shown to have a lower risk of mortality. >=100,000 COLONIES/mL VANCOMYCIN RESISTANT ENTEROCOCCUS ISOLATED    Report Status 06/01/2019 FINAL  Final   Organism ID, Bacteria KLEBSIELLA PNEUMONIAE (A)  Final   Organism ID, Bacteria VANCOMYCIN RESISTANT ENTEROCOCCUS ISOLATED (A)  Final      Susceptibility   Klebsiella pneumoniae - MIC*    AMPICILLIN >=32 RESISTANT Resistant     CEFAZOLIN >=64 RESISTANT Resistant     CEFTRIAXONE >=64 RESISTANT Resistant     CIPROFLOXACIN <=0.25 SENSITIVE Sensitive     GENTAMICIN <=1 SENSITIVE Sensitive     IMIPENEM <=0.25 SENSITIVE Sensitive     NITROFURANTOIN 64  INTERMEDIATE Intermediate     TRIMETH/SULFA >=320 RESISTANT Resistant     AMPICILLIN/SULBACTAM >=32 RESISTANT Resistant     PIP/TAZO 8 SENSITIVE Sensitive     Extended ESBL POSITIVE Resistant     * >=100,000 COLONIES/mL KLEBSIELLA PNEUMONIAE   Vancomycin resistant enterococcus isolated - MIC*    AMPICILLIN >=32 RESISTANT Resistant     LEVOFLOXACIN >=8 RESISTANT Resistant     NITROFURANTOIN 64 INTERMEDIATE Intermediate     VANCOMYCIN >=32 RESISTANT Resistant     LINEZOLID 2 SENSITIVE Sensitive     * >=100,000 COLONIES/mL VANCOMYCIN RESISTANT ENTEROCOCCUS ISOLATED  Novel Coronavirus, NAA (Hosp order, Send-out to Ref Lab; TAT 18-24 hrs     Status: None   Collection Time: 05/30/19  6:41 AM   Specimen: Nasopharyngeal Swab; Respiratory  Result Value Ref Range Status   SARS-CoV-2, NAA NOT DETECTED NOT DETECTED Final    Comment: (NOTE) This nucleic acid amplification test was developed and its performance characteristics determined by [employer]. Nucleic acid amplification tests include PCR and TMA. This test has not been FDA cleared or approved. This test has been authorized by FDA under  an Emergency Use Authorization (EUA). This test is only authorized for the duration of time the declaration that circumstances exist justifying the authorization of the emergency use of in vitro diagnostic tests for detection of SARS-CoV-2 virus and/or diagnosis of COVID-19 infection under section 564(b)(1) of the Act, 21 U.S.C. 638GTX-6(I) (1), unless the authorization is terminated or revoked sooner. When diagnostic testing is negative, the possibility of a false negative result should be considered in the context of a patient's recent exposures and the presence of clinical signs and symptoms consistent with COVID-19. An individual without symptoms of COVID- 19 and who is not shedding SARS-CoV-2 vi rus would expect to have a negative (not detected) result in this assay. Performed At: [facility] [address] [place], [place] [id number] Rush Farmer MD MG:[id number]    [place]  Final    Comment: Performed at [facility], [place] [address], [place], [place] [address]     Labs:   CBC: Recent Labs  Lab 06/01/19 [phone] 06/02/19 0406 06/03/19 0016 06/04/19 0337 06/05/19 0349  WBC 10.4 7.8 8.0 8.1 8.0  HGB 11.7* 10.2* 10.3* 10.6* 10.5*  HCT 38.5* 33.6* 33.5* 35.1* 34.6*  MCV 100.0 100.6* 99.7 100.9* 103.0*  PLT 126* 96* 89* 108* 882*   Basic Metabolic Panel: Recent Labs  Lab 06/01/19 0637 06/02/19 0406 06/03/19 0016 06/04/19 0337 06/05/19 0349  NA 140 140 137 142 144  K 3.5 3.8 2.8* 3.9 4.2  CL 105 105 106 110 115*  CO2 21* 21* 21* 21* 19*  GLUCOSE 128* 169* 192* 134* 140*  BUN 61* 57* 57* 53* 49*  CREATININE 2.67* 2.65* 2.42* 2.28* 2.26*  CALCIUM 8.2* 7.6* 7.0* 7.0* 6.8*  PHOS  --   --   --  3.3  --    Liver Function Tests: No results for input(s): AST, ALT, ALKPHOS, BILITOT, PROT, ALBUMIN in the last 168 hours. BNP (last 3 results) Recent Labs    05/04/19 1002 05/06/19 2052 05/14/19 0921  BNP 82.0 379.2* 2,845.2*   Cardiac Enzymes: No results for input(s): CKTOTAL, CKMB, CKMBINDEX, TROPONINI in the last 168 hours. CBG: Recent Labs  Lab 06/04/19 0745 06/04/19 1113 06/04/19 1621 06/04/19 2107 06/05/19 0816  GLUCAP 124* 145* 95 147* 106*  Urinalysis    Component Value Date/Time   COLORURINE YELLOW 06/04/2019 0721   APPEARANCEUR HAZY (A) 06/04/2019 0721   LABSPEC 1.013 06/04/2019 0721   PHURINE 5.0 06/04/2019 0721   GLUCOSEU 50 (A) 06/04/2019 0721   HGBUR LARGE (A) 06/04/2019 0721   BILIRUBINUR NEGATIVE 06/04/2019 0721   KETONESUR NEGATIVE 06/04/2019 0721   PROTEINUR 30 (A) 06/04/2019 0721   NITRITE NEGATIVE 06/04/2019 0721   LEUKOCYTESUR LARGE (A) 06/04/2019 0721    Time coordinating discharge: Over 55  minutes  SIGNED: Deatra Kedron, MD, FACP, FHM. [facility],  Pager  [phone]  If 7PM-7AM, please contact night-coverage [email or web address] Ota [facility] 06/05/2019, 10:56 AM

## 2019-06-05 NOTE — Progress Notes (Signed)
Hypoglycemic Event  CBG: 61  Treatment: 8 oz juice/soda  Symptoms: None  Follow-up CBG: Time:1728 CBG Result: 53  Possible Reasons for Event: Inadequate meal intake  Comments/MD notified:pt didn't eat lunch, lunch tray was brought to new unit with pt. PA notified    Lynnea Maizes Telly Jawad

## 2019-06-05 NOTE — Progress Notes (Signed)
Pt admitted to 4M01. Transferred via bed then transferred to air mattress bed on unit. Belongings at bedside. O2 4L/[place]. Foley patent and intact. Oriented to unit and safety precautions. Call bell placed in reach. Will cont to monitor.  Erie Noe, RN

## 2019-06-05 NOTE — Progress Notes (Signed)
HD catheter removed per order and line is intact. Vaseline gauze and gauze dressing applied and pressure held for 20 minutes. Site clean, dry, and intact. Patient and RN aware that patient is on bedrest for 30 minutes. Patient aware to leave dressing on and dry for 24 hours.

## 2019-06-06 ENCOUNTER — Inpatient Hospital Stay (HOSPITAL_COMMUNITY): Payer: Federal, State, Local not specified - PPO | Admitting: Occupational Therapy

## 2019-06-06 ENCOUNTER — Inpatient Hospital Stay (HOSPITAL_COMMUNITY): Payer: Medicare Other

## 2019-06-06 ENCOUNTER — Encounter (HOSPITAL_COMMUNITY): Payer: Self-pay | Admitting: Family Medicine

## 2019-06-06 ENCOUNTER — Inpatient Hospital Stay (HOSPITAL_COMMUNITY): Payer: Federal, State, Local not specified - PPO | Admitting: Physical Therapy

## 2019-06-06 ENCOUNTER — Inpatient Hospital Stay (HOSPITAL_COMMUNITY)
Admission: AD | Admit: 2019-06-06 | Discharge: 2019-06-30 | DRG: 208 | Disposition: E | Payer: Medicare Other | Source: Other Acute Inpatient Hospital | Attending: Internal Medicine | Admitting: Internal Medicine

## 2019-06-06 DIAGNOSIS — E8809 Other disorders of plasma-protein metabolism, not elsewhere classified: Secondary | ICD-10-CM | POA: Diagnosis present

## 2019-06-06 DIAGNOSIS — I4819 Other persistent atrial fibrillation: Secondary | ICD-10-CM | POA: Diagnosis present

## 2019-06-06 DIAGNOSIS — G40909 Epilepsy, unspecified, not intractable, without status epilepticus: Secondary | ICD-10-CM | POA: Diagnosis present

## 2019-06-06 DIAGNOSIS — I2510 Atherosclerotic heart disease of native coronary artery without angina pectoris: Secondary | ICD-10-CM | POA: Diagnosis present

## 2019-06-06 DIAGNOSIS — J810 Acute pulmonary edema: Secondary | ICD-10-CM | POA: Diagnosis not present

## 2019-06-06 DIAGNOSIS — Z87891 Personal history of nicotine dependence: Secondary | ICD-10-CM

## 2019-06-06 DIAGNOSIS — I5023 Acute on chronic systolic (congestive) heart failure: Secondary | ICD-10-CM | POA: Diagnosis present

## 2019-06-06 DIAGNOSIS — R6521 Severe sepsis with septic shock: Secondary | ICD-10-CM | POA: Diagnosis not present

## 2019-06-06 DIAGNOSIS — J90 Pleural effusion, not elsewhere classified: Secondary | ICD-10-CM

## 2019-06-06 DIAGNOSIS — E878 Other disorders of electrolyte and fluid balance, not elsewhere classified: Secondary | ICD-10-CM | POA: Diagnosis not present

## 2019-06-06 DIAGNOSIS — E1122 Type 2 diabetes mellitus with diabetic chronic kidney disease: Secondary | ICD-10-CM | POA: Diagnosis present

## 2019-06-06 DIAGNOSIS — R569 Unspecified convulsions: Secondary | ICD-10-CM | POA: Diagnosis not present

## 2019-06-06 DIAGNOSIS — E440 Moderate protein-calorie malnutrition: Secondary | ICD-10-CM | POA: Diagnosis present

## 2019-06-06 DIAGNOSIS — D509 Iron deficiency anemia, unspecified: Secondary | ICD-10-CM | POA: Diagnosis present

## 2019-06-06 DIAGNOSIS — Z808 Family history of malignant neoplasm of other organs or systems: Secondary | ICD-10-CM

## 2019-06-06 DIAGNOSIS — I429 Cardiomyopathy, unspecified: Secondary | ICD-10-CM | POA: Diagnosis present

## 2019-06-06 DIAGNOSIS — I361 Nonrheumatic tricuspid (valve) insufficiency: Secondary | ICD-10-CM

## 2019-06-06 DIAGNOSIS — E2740 Unspecified adrenocortical insufficiency: Secondary | ICD-10-CM | POA: Diagnosis present

## 2019-06-06 DIAGNOSIS — I4891 Unspecified atrial fibrillation: Secondary | ICD-10-CM | POA: Diagnosis present

## 2019-06-06 DIAGNOSIS — I252 Old myocardial infarction: Secondary | ICD-10-CM

## 2019-06-06 DIAGNOSIS — J439 Emphysema, unspecified: Secondary | ICD-10-CM | POA: Diagnosis present

## 2019-06-06 DIAGNOSIS — E1151 Type 2 diabetes mellitus with diabetic peripheral angiopathy without gangrene: Secondary | ICD-10-CM | POA: Diagnosis present

## 2019-06-06 DIAGNOSIS — R0902 Hypoxemia: Secondary | ICD-10-CM

## 2019-06-06 DIAGNOSIS — R339 Retention of urine, unspecified: Secondary | ICD-10-CM | POA: Diagnosis not present

## 2019-06-06 DIAGNOSIS — G9341 Metabolic encephalopathy: Secondary | ICD-10-CM | POA: Diagnosis not present

## 2019-06-06 DIAGNOSIS — J918 Pleural effusion in other conditions classified elsewhere: Secondary | ICD-10-CM | POA: Diagnosis present

## 2019-06-06 DIAGNOSIS — I5043 Acute on chronic combined systolic (congestive) and diastolic (congestive) heart failure: Secondary | ICD-10-CM | POA: Diagnosis present

## 2019-06-06 DIAGNOSIS — N401 Enlarged prostate with lower urinary tract symptoms: Secondary | ICD-10-CM | POA: Diagnosis not present

## 2019-06-06 DIAGNOSIS — Z800 Family history of malignant neoplasm of digestive organs: Secondary | ICD-10-CM

## 2019-06-06 DIAGNOSIS — J9621 Acute and chronic respiratory failure with hypoxia: Secondary | ICD-10-CM | POA: Diagnosis present

## 2019-06-06 DIAGNOSIS — Z66 Do not resuscitate: Secondary | ICD-10-CM | POA: Diagnosis not present

## 2019-06-06 DIAGNOSIS — E11649 Type 2 diabetes mellitus with hypoglycemia without coma: Secondary | ICD-10-CM | POA: Diagnosis not present

## 2019-06-06 DIAGNOSIS — T502X5A Adverse effect of carbonic-anhydrase inhibitors, benzothiadiazides and other diuretics, initial encounter: Secondary | ICD-10-CM | POA: Diagnosis not present

## 2019-06-06 DIAGNOSIS — Z4659 Encounter for fitting and adjustment of other gastrointestinal appliance and device: Secondary | ICD-10-CM

## 2019-06-06 DIAGNOSIS — Z9861 Coronary angioplasty status: Secondary | ICD-10-CM

## 2019-06-06 DIAGNOSIS — R159 Full incontinence of feces: Secondary | ICD-10-CM | POA: Diagnosis present

## 2019-06-06 DIAGNOSIS — J9601 Acute respiratory failure with hypoxia: Secondary | ICD-10-CM | POA: Diagnosis not present

## 2019-06-06 DIAGNOSIS — E876 Hypokalemia: Secondary | ICD-10-CM | POA: Diagnosis present

## 2019-06-06 DIAGNOSIS — E872 Acidosis, unspecified: Secondary | ICD-10-CM | POA: Diagnosis present

## 2019-06-06 DIAGNOSIS — I482 Chronic atrial fibrillation, unspecified: Secondary | ICD-10-CM | POA: Diagnosis not present

## 2019-06-06 DIAGNOSIS — R54 Age-related physical debility: Secondary | ICD-10-CM | POA: Diagnosis present

## 2019-06-06 DIAGNOSIS — N170 Acute kidney failure with tubular necrosis: Secondary | ICD-10-CM | POA: Diagnosis present

## 2019-06-06 DIAGNOSIS — B948 Sequelae of other specified infectious and parasitic diseases: Secondary | ICD-10-CM

## 2019-06-06 DIAGNOSIS — Z923 Personal history of irradiation: Secondary | ICD-10-CM

## 2019-06-06 DIAGNOSIS — I25810 Atherosclerosis of coronary artery bypass graft(s) without angina pectoris: Secondary | ICD-10-CM | POA: Diagnosis not present

## 2019-06-06 DIAGNOSIS — I130 Hypertensive heart and chronic kidney disease with heart failure and stage 1 through stage 4 chronic kidney disease, or unspecified chronic kidney disease: Secondary | ICD-10-CM | POA: Diagnosis present

## 2019-06-06 DIAGNOSIS — I340 Nonrheumatic mitral (valve) insufficiency: Secondary | ICD-10-CM | POA: Diagnosis not present

## 2019-06-06 DIAGNOSIS — J8489 Other specified interstitial pulmonary diseases: Secondary | ICD-10-CM | POA: Diagnosis present

## 2019-06-06 DIAGNOSIS — N184 Chronic kidney disease, stage 4 (severe): Secondary | ICD-10-CM | POA: Diagnosis present

## 2019-06-06 DIAGNOSIS — J189 Pneumonia, unspecified organism: Secondary | ICD-10-CM | POA: Diagnosis present

## 2019-06-06 DIAGNOSIS — J80 Acute respiratory distress syndrome: Secondary | ICD-10-CM

## 2019-06-06 DIAGNOSIS — G7281 Critical illness myopathy: Secondary | ICD-10-CM | POA: Diagnosis not present

## 2019-06-06 DIAGNOSIS — Z6827 Body mass index (BMI) 27.0-27.9, adult: Secondary | ICD-10-CM

## 2019-06-06 DIAGNOSIS — Z951 Presence of aortocoronary bypass graft: Secondary | ICD-10-CM

## 2019-06-06 DIAGNOSIS — Z794 Long term (current) use of insulin: Secondary | ICD-10-CM

## 2019-06-06 DIAGNOSIS — Z79899 Other long term (current) drug therapy: Secondary | ICD-10-CM

## 2019-06-06 DIAGNOSIS — N136 Pyonephrosis: Secondary | ICD-10-CM | POA: Diagnosis not present

## 2019-06-06 DIAGNOSIS — L98419 Non-pressure chronic ulcer of buttock with unspecified severity: Secondary | ICD-10-CM | POA: Diagnosis not present

## 2019-06-06 DIAGNOSIS — R945 Abnormal results of liver function studies: Secondary | ICD-10-CM | POA: Diagnosis present

## 2019-06-06 DIAGNOSIS — Z7901 Long term (current) use of anticoagulants: Secondary | ICD-10-CM

## 2019-06-06 DIAGNOSIS — Z7902 Long term (current) use of antithrombotics/antiplatelets: Secondary | ICD-10-CM

## 2019-06-06 DIAGNOSIS — Y95 Nosocomial condition: Secondary | ICD-10-CM | POA: Diagnosis present

## 2019-06-06 DIAGNOSIS — Z8249 Family history of ischemic heart disease and other diseases of the circulatory system: Secondary | ICD-10-CM

## 2019-06-06 DIAGNOSIS — J9600 Acute respiratory failure, unspecified whether with hypoxia or hypercapnia: Secondary | ICD-10-CM | POA: Diagnosis present

## 2019-06-06 DIAGNOSIS — K219 Gastro-esophageal reflux disease without esophagitis: Secondary | ICD-10-CM | POA: Diagnosis present

## 2019-06-06 DIAGNOSIS — Z8051 Family history of malignant neoplasm of kidney: Secondary | ICD-10-CM

## 2019-06-06 DIAGNOSIS — U071 COVID-19: Secondary | ICD-10-CM | POA: Diagnosis not present

## 2019-06-06 DIAGNOSIS — Z8589 Personal history of malignant neoplasm of other organs and systems: Secondary | ICD-10-CM

## 2019-06-06 DIAGNOSIS — Z87440 Personal history of urinary (tract) infections: Secondary | ICD-10-CM | POA: Diagnosis not present

## 2019-06-06 DIAGNOSIS — Z8674 Personal history of sudden cardiac arrest: Secondary | ICD-10-CM

## 2019-06-06 DIAGNOSIS — Z95820 Peripheral vascular angioplasty status with implants and grafts: Secondary | ICD-10-CM

## 2019-06-06 DIAGNOSIS — A419 Sepsis, unspecified organism: Secondary | ICD-10-CM | POA: Diagnosis not present

## 2019-06-06 DIAGNOSIS — Z85828 Personal history of other malignant neoplasm of skin: Secondary | ICD-10-CM

## 2019-06-06 HISTORY — DX: Hematuria, unspecified: R31.9

## 2019-06-06 HISTORY — DX: Unspecified adrenocortical insufficiency: E27.40

## 2019-06-06 HISTORY — DX: Unspecified convulsions: R56.9

## 2019-06-06 HISTORY — DX: Cardiac arrest, cause unspecified: I46.9

## 2019-06-06 HISTORY — DX: Anemia, unspecified: D64.9

## 2019-06-06 HISTORY — DX: COVID-19: U07.1

## 2019-06-06 HISTORY — DX: Resistance to vancomycin: A49.1

## 2019-06-06 HISTORY — DX: Chronic systolic (congestive) heart failure: I50.22

## 2019-06-06 HISTORY — DX: Acidosis: E87.2

## 2019-06-06 HISTORY — DX: Chronic kidney disease, stage 4 (severe): N18.4

## 2019-06-06 HISTORY — DX: Disorder of arteries and arterioles, unspecified: I77.9

## 2019-06-06 HISTORY — DX: Peripheral vascular disease, unspecified: I73.9

## 2019-06-06 HISTORY — DX: Other persistent atrial fibrillation: I48.19

## 2019-06-06 HISTORY — DX: Acidosis, unspecified: E87.20

## 2019-06-06 HISTORY — DX: Essential (primary) hypertension: I10

## 2019-06-06 LAB — CBC WITH DIFFERENTIAL/PLATELET
Abs Immature Granulocytes: 0.15 10*3/uL — ABNORMAL HIGH (ref 0.00–0.07)
Basophils Absolute: 0 10*3/uL (ref 0.0–0.1)
Basophils Relative: 0 %
Eosinophils Absolute: 0.4 10*3/uL (ref 0.0–0.5)
Eosinophils Relative: 3 %
HCT: 40.8 % (ref 39.0–52.0)
Hemoglobin: 11.7 g/dL — ABNORMAL LOW (ref 13.0–17.0)
Immature Granulocytes: 1 %
Lymphocytes Relative: 15 %
Lymphs Abs: 2.1 10*3/uL (ref 0.7–4.0)
MCH: 30.7 pg (ref 26.0–34.0)
MCHC: 28.7 g/dL — ABNORMAL LOW (ref 30.0–36.0)
MCV: 107.1 fL — ABNORMAL HIGH (ref 80.0–100.0)
Monocytes Absolute: 0.8 10*3/uL (ref 0.1–1.0)
Monocytes Relative: 6 %
Neutro Abs: 10 10*3/uL — ABNORMAL HIGH (ref 1.7–7.7)
Neutrophils Relative %: 75 %
Platelets: 185 10*3/uL (ref 150–400)
RBC: 3.81 MIL/uL — ABNORMAL LOW (ref 4.22–5.81)
RDW: 20.2 % — ABNORMAL HIGH (ref 11.5–15.5)
WBC: 13.4 10*3/uL — ABNORMAL HIGH (ref 4.0–10.5)
nRBC: 0.4 % — ABNORMAL HIGH (ref 0.0–0.2)

## 2019-06-06 LAB — COMPREHENSIVE METABOLIC PANEL
ALT: 43 U/L (ref 0–44)
AST: 47 U/L — ABNORMAL HIGH (ref 15–41)
Albumin: 2.5 g/dL — ABNORMAL LOW (ref 3.5–5.0)
Alkaline Phosphatase: 269 U/L — ABNORMAL HIGH (ref 38–126)
Anion gap: 11 (ref 5–15)
BUN: 47 mg/dL — ABNORMAL HIGH (ref 8–23)
CO2: 18 mmol/L — ABNORMAL LOW (ref 22–32)
Calcium: 6.9 mg/dL — ABNORMAL LOW (ref 8.9–10.3)
Chloride: 114 mmol/L — ABNORMAL HIGH (ref 98–111)
Creatinine, Ser: 2.51 mg/dL — ABNORMAL HIGH (ref 0.61–1.24)
GFR calc Af Amer: 30 mL/min — ABNORMAL LOW (ref >60)
GFR calc non Af Amer: 26 mL/min — ABNORMAL LOW (ref >60)
Glucose, Bld: 196 mg/dL — ABNORMAL HIGH (ref 70–99)
Potassium: 4.8 mmol/L (ref 3.5–5.1)
Sodium: 143 mmol/L (ref 135–145)
Total Bilirubin: 1.3 mg/dL — ABNORMAL HIGH (ref 0.3–1.2)
Total Protein: 4.9 g/dL — ABNORMAL LOW (ref 6.5–8.1)

## 2019-06-06 LAB — GLUCOSE, CAPILLARY
Glucose-Capillary: 135 mg/dL — ABNORMAL HIGH (ref 70–99)
Glucose-Capillary: 63 mg/dL — ABNORMAL LOW (ref 70–99)
Glucose-Capillary: 63 mg/dL — ABNORMAL LOW (ref 70–99)
Glucose-Capillary: 72 mg/dL (ref 70–99)
Glucose-Capillary: 76 mg/dL (ref 70–99)

## 2019-06-06 LAB — LACTIC ACID, PLASMA
Lactic Acid, Venous: 2.9 mmol/L (ref 0.5–1.9)
Lactic Acid, Venous: 1.8 mmol/L (ref 0.5–1.9)

## 2019-06-06 LAB — URINE CULTURE: Culture: 80000 — AB

## 2019-06-06 LAB — ECHOCARDIOGRAM COMPLETE

## 2019-06-06 LAB — PROCALCITONIN: Procalcitonin: 0.17 ng/mL

## 2019-06-06 IMAGING — DX DG CHEST 1V PORT
1 series · 1 of 1 positions shown · non-contrast
Comparison: [DATE]

CLINICAL DATA: Respiratory distress.

EXAM:
PORTABLE CHEST 1 VIEW

[chest]
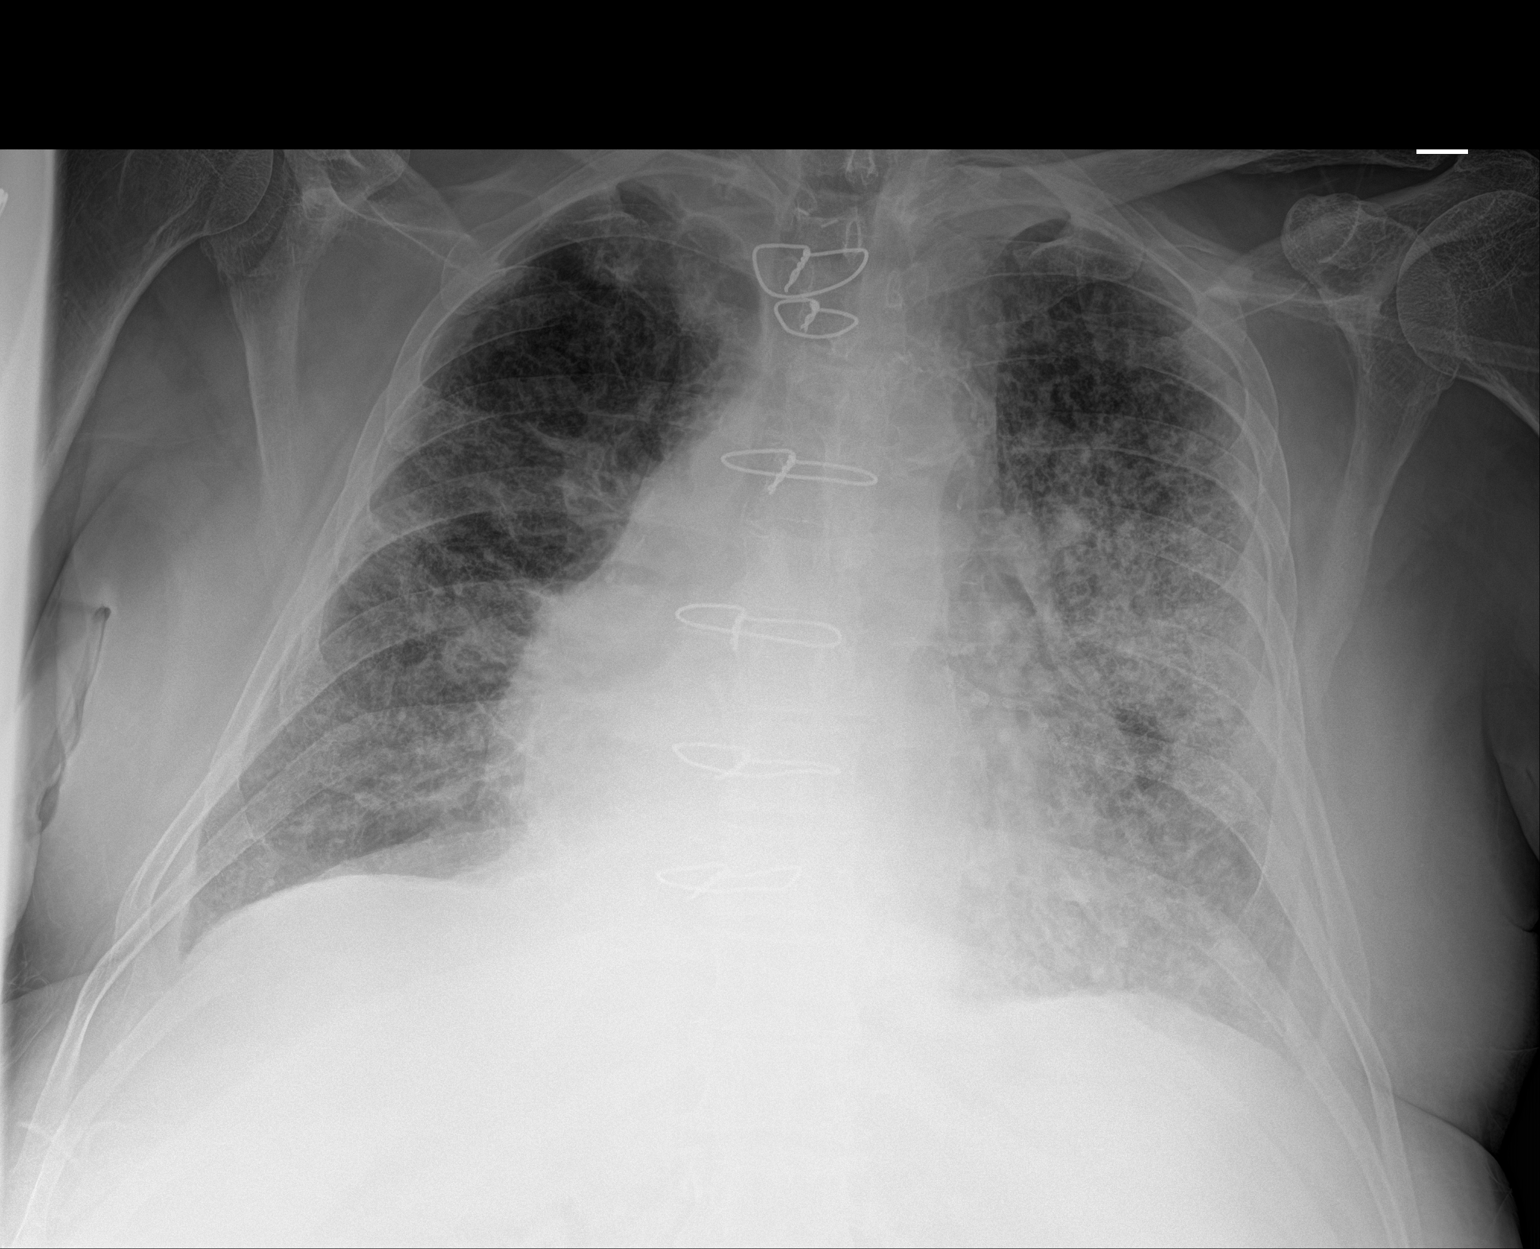

[1 of 1 positions shown; findings below may reference images not displayed]

FINDINGS: Sternotomy wires unchanged. Lungs are adequately inflated
demonstrate slight interval worsening of diffuse bilateral mixed
interstitial airspace density most prominent over the central lungs
left greater than right. Findings may be due to asymmetric edema
versus infection. Possible small amount left pleural fluid. No
evidence of pneumothorax. Mild cardiomegaly. Remainder of the exam
is unchanged.
IMPRESSION: Mild cardiomegaly with mild interval worsening of bilateral mixed
interstitial airspace process left worse than right. Findings may be
due to asymmetric edema versus infection. Possible small amount left
pleural fluid.

## 2019-06-06 MED ORDER — AMIODARONE HCL IN DEXTROSE 360-4.14 MG/200ML-% IV SOLN
30.0000 mg/h | INTRAVENOUS | Status: AC
  Administered 2019-06-06 – 2019-06-09 (×8): 30 mg/h via INTRAVENOUS
  Filled 2019-06-06 (×8): qty 200

## 2019-06-06 MED ORDER — DEXTROSE 50 % IV SOLN
25.0000 mL | Freq: Once | INTRAVENOUS | Status: AC
  Administered 2019-06-07: 25 mL via INTRAVENOUS

## 2019-06-06 MED ORDER — LORAZEPAM 2 MG/ML IJ SOLN
0.5000 mg | Freq: Four times a day (QID) | INTRAMUSCULAR | Status: DC | PRN
  Administered 2019-06-06 – 2019-06-12 (×5): 0.5 mg via INTRAVENOUS
  Filled 2019-06-06 (×5): qty 1

## 2019-06-06 MED ORDER — CALCITRIOL 0.25 MCG PO CAPS
0.2500 ug | ORAL_CAPSULE | Freq: Every day | ORAL | Status: DC
  Administered 2019-06-06 – 2019-06-14 (×9): 0.25 ug via ORAL
  Filled 2019-06-06 (×10): qty 1

## 2019-06-06 MED ORDER — FUROSEMIDE 10 MG/ML IJ SOLN: 40.0000 mg | Freq: Two times a day (BID) | INTRAMUSCULAR | Status: DC

## 2019-06-06 MED ORDER — FUROSEMIDE 10 MG/ML IJ SOLN
80.0000 mg | Freq: Once | INTRAMUSCULAR | Status: AC
  Administered 2019-06-06: 80 mg via INTRAVENOUS

## 2019-06-06 MED ORDER — PREDNISONE 10 MG PO TABS: 10.0000 mg | ORAL_TABLET | Freq: Every day | ORAL | Status: DC

## 2019-06-06 MED ORDER — FUROSEMIDE 10 MG/ML IJ SOLN
INTRAMUSCULAR | Status: AC
  Filled 2019-06-06: qty 4

## 2019-06-06 MED ORDER — IPRATROPIUM-ALBUTEROL 0.5-2.5 (3) MG/3ML IN SOLN
3.0000 mL | Freq: Two times a day (BID) | RESPIRATORY_TRACT | Status: DC
  Administered 2019-06-06 – 2019-06-15 (×18): 3 mL via RESPIRATORY_TRACT
  Filled 2019-06-06 (×19): qty 3

## 2019-06-06 MED ORDER — LEVALBUTEROL HCL 0.63 MG/3ML IN NEBU
0.6300 mg | INHALATION_SOLUTION | Freq: Four times a day (QID) | RESPIRATORY_TRACT | Status: DC | PRN
  Administered 2019-06-06: 0.63 mg via RESPIRATORY_TRACT
  Filled 2019-06-06: qty 3

## 2019-06-06 MED ORDER — MIDODRINE HCL 5 MG PO TABS
5.0000 mg | ORAL_TABLET | Freq: Three times a day (TID) | ORAL | Status: DC
  Administered 2019-06-06 – 2019-06-15 (×25): 5 mg via ORAL
  Filled 2019-06-06 (×26): qty 1

## 2019-06-06 MED ORDER — INSULIN ASPART 100 UNIT/ML ~~LOC~~ SOLN
0.0000 [IU] | Freq: Every day | SUBCUTANEOUS | Status: DC
  Administered 2019-06-09: 2 [IU] via SUBCUTANEOUS
  Administered 2019-06-11 – 2019-06-13 (×3): 3 [IU] via SUBCUTANEOUS
  Administered 2019-06-14: 2 [IU] via SUBCUTANEOUS

## 2019-06-06 MED ORDER — PERFLUTREN LIPID MICROSPHERE
1.0000 mL | INTRAVENOUS | Status: AC | PRN
  Administered 2019-06-06: 2 mL via INTRAVENOUS
  Filled 2019-06-06: qty 10

## 2019-06-06 MED ORDER — PREDNISONE 20 MG PO TABS
40.0000 mg | ORAL_TABLET | Freq: Every day | ORAL | Status: AC
  Administered 2019-06-09 – 2019-06-10 (×2): 40 mg via ORAL
  Filled 2019-06-06 (×4): qty 2

## 2019-06-06 MED ORDER — MOMETASONE FURO-FORMOTEROL FUM 200-5 MCG/ACT IN AERO
2.0000 | INHALATION_SPRAY | Freq: Two times a day (BID) | RESPIRATORY_TRACT | Status: DC
  Administered 2019-06-06 – 2019-06-15 (×16): 2 via RESPIRATORY_TRACT

## 2019-06-06 MED ORDER — HYDROXYZINE HCL 25 MG PO TABS
25.0000 mg | ORAL_TABLET | Freq: Three times a day (TID) | ORAL | Status: DC | PRN
  Filled 2019-06-06: qty 1

## 2019-06-06 MED ORDER — LORAZEPAM 2 MG/ML IJ SOLN: 0.5000 mg | Freq: Four times a day (QID) | INTRAMUSCULAR | Status: DC | PRN

## 2019-06-06 MED ORDER — FUROSEMIDE 10 MG/ML IJ SOLN
80.0000 mg | Freq: Two times a day (BID) | INTRAMUSCULAR | Status: DC
  Administered 2019-06-06 – 2019-06-07 (×2): 80 mg via INTRAVENOUS
  Filled 2019-06-06 (×2): qty 8

## 2019-06-06 MED ORDER — SODIUM CHLORIDE 0.9% FLUSH
3.0000 mL | Freq: Two times a day (BID) | INTRAVENOUS | Status: DC
  Administered 2019-06-06 – 2019-06-14 (×11): 3 mL via INTRAVENOUS

## 2019-06-06 MED ORDER — APIXABAN 2.5 MG PO TABS
2.5000 mg | ORAL_TABLET | Freq: Two times a day (BID) | ORAL | Status: DC
  Administered 2019-06-06: 2.5 mg via ORAL
  Filled 2019-06-06: qty 1

## 2019-06-06 MED ORDER — PANTOPRAZOLE SODIUM 40 MG PO TBEC
40.0000 mg | DELAYED_RELEASE_TABLET | Freq: Every day | ORAL | Status: DC
  Administered 2019-06-06 – 2019-06-14 (×9): 40 mg via ORAL
  Filled 2019-06-06 (×10): qty 1

## 2019-06-06 MED ORDER — CHLORHEXIDINE GLUCONATE CLOTH 2 % EX PADS
6.0000 | MEDICATED_PAD | Freq: Every day | CUTANEOUS | Status: DC
  Administered 2019-06-07 – 2019-06-15 (×9): 6 via TOPICAL

## 2019-06-06 MED ORDER — SODIUM BICARBONATE 650 MG PO TABS
1300.0000 mg | ORAL_TABLET | Freq: Two times a day (BID) | ORAL | Status: DC
  Administered 2019-06-06 – 2019-06-12 (×13): 1300 mg via ORAL
  Filled 2019-06-06 (×14): qty 2

## 2019-06-06 MED ORDER — POLYETHYLENE GLYCOL 3350 17 G PO PACK
17.0000 g | PACK | Freq: Every day | ORAL | Status: DC
  Administered 2019-06-08 – 2019-06-15 (×3): 17 g via ORAL
  Filled 2019-06-06 (×6): qty 1

## 2019-06-06 MED ORDER — PREDNISONE 50 MG PO TABS
50.0000 mg | ORAL_TABLET | Freq: Every day | ORAL | Status: AC
  Administered 2019-06-07: 50 mg via ORAL
  Filled 2019-06-06: qty 1

## 2019-06-06 MED ORDER — DILTIAZEM LOAD VIA INFUSION
15.0000 mg | Freq: Once | INTRAVENOUS | Status: AC
  Administered 2019-06-06: 15 mg via INTRAVENOUS
  Filled 2019-06-06: qty 15

## 2019-06-06 MED ORDER — PREDNISONE 10 MG PO TABS: 5.0000 mg | ORAL_TABLET | Freq: Every day | ORAL | Status: DC

## 2019-06-06 MED ORDER — METOPROLOL TARTRATE 50 MG PO TABS
50.0000 mg | ORAL_TABLET | Freq: Four times a day (QID) | ORAL | Status: AC
  Administered 2019-06-06 – 2019-06-11 (×19): 50 mg via ORAL
  Administered 2019-06-11: 25 mg via ORAL
  Administered 2019-06-11: 50 mg via ORAL
  Filled 2019-06-06 (×15): qty 1
  Filled 2019-06-06: qty 2
  Filled 2019-06-06 (×5): qty 1

## 2019-06-06 MED ORDER — SODIUM BICARBONATE 650 MG PO TABS: 650.0000 mg | ORAL_TABLET | Freq: Two times a day (BID) | ORAL | Status: DC

## 2019-06-06 MED ORDER — DILTIAZEM HCL ER COATED BEADS 240 MG PO CP24
240.0000 mg | ORAL_CAPSULE | Freq: Every day | ORAL | Status: DC
  Administered 2019-06-06: 240 mg via ORAL
  Filled 2019-06-06: qty 1

## 2019-06-06 MED ORDER — MELATONIN 3 MG PO TABS
6.0000 mg | ORAL_TABLET | Freq: Every day | ORAL | Status: DC
  Administered 2019-06-06 – 2019-06-14 (×9): 6 mg via ORAL
  Filled 2019-06-06 (×10): qty 2

## 2019-06-06 MED ORDER — RIVAROXABAN 15 MG PO TABS
15.0000 mg | ORAL_TABLET | Freq: Every day | ORAL | Status: DC
  Administered 2019-06-06 – 2019-06-14 (×9): 15 mg via ORAL
  Filled 2019-06-06 (×9): qty 1

## 2019-06-06 MED ORDER — SODIUM CHLORIDE 0.9% FLUSH: 3.0000 mL | INTRAVENOUS | Status: DC | PRN

## 2019-06-06 MED ORDER — DILTIAZEM HCL-DEXTROSE 125-5 MG/125ML-% IV SOLN (PREMIX)
5.0000 mg/h | INTRAVENOUS | Status: DC
  Administered 2019-06-06: 5 mg/h via INTRAVENOUS
  Filled 2019-06-06: qty 125

## 2019-06-06 MED ORDER — CARVEDILOL 3.125 MG PO TABS: 3.1250 mg | ORAL_TABLET | Freq: Two times a day (BID) | ORAL | Status: DC

## 2019-06-06 MED ORDER — GERHARDT'S BUTT CREAM
TOPICAL_CREAM | Freq: Four times a day (QID) | CUTANEOUS | Status: DC
  Administered 2019-06-06: 15:00:00 via TOPICAL
  Administered 2019-06-06: 1 via TOPICAL
  Administered 2019-06-06: 19:00:00 via TOPICAL
  Administered 2019-06-07: 1 via TOPICAL
  Administered 2019-06-07 – 2019-06-09 (×8): via TOPICAL
  Administered 2019-06-09: 1 via TOPICAL
  Administered 2019-06-09: 22:00:00 via TOPICAL
  Administered 2019-06-09: 1 via TOPICAL
  Administered 2019-06-10 – 2019-06-13 (×12): via TOPICAL
  Administered 2019-06-13: 1 via TOPICAL
  Administered 2019-06-13 – 2019-06-14 (×6): via TOPICAL
  Filled 2019-06-06 (×4): qty 1

## 2019-06-06 MED ORDER — PREDNISONE 20 MG PO TABS: 20.0000 mg | ORAL_TABLET | Freq: Every day | ORAL | Status: DC

## 2019-06-06 MED ORDER — AMIODARONE HCL 200 MG PO TABS
200.0000 mg | ORAL_TABLET | Freq: Two times a day (BID) | ORAL | Status: DC
  Administered 2019-06-06: 200 mg via ORAL
  Filled 2019-06-06: qty 1

## 2019-06-06 MED ORDER — DEXTROSE 50 % IV SOLN
INTRAVENOUS | Status: AC
  Administered 2019-06-07: 25 mL via INTRAVENOUS
  Filled 2019-06-06: qty 50

## 2019-06-06 MED ORDER — PREDNISONE 20 MG PO TABS
30.0000 mg | ORAL_TABLET | Freq: Every day | ORAL | Status: DC
  Administered 2019-06-11: 30 mg via ORAL
  Filled 2019-06-06: qty 1

## 2019-06-06 MED ORDER — LEVETIRACETAM 500 MG PO TABS
500.0000 mg | ORAL_TABLET | Freq: Two times a day (BID) | ORAL | Status: DC
  Administered 2019-06-06 – 2019-06-14 (×18): 500 mg via ORAL
  Filled 2019-06-06 (×19): qty 1

## 2019-06-06 MED ORDER — INSULIN ASPART 100 UNIT/ML ~~LOC~~ SOLN
0.0000 [IU] | Freq: Three times a day (TID) | SUBCUTANEOUS | Status: DC
  Administered 2019-06-06 – 2019-06-08 (×4): 1 [IU] via SUBCUTANEOUS
  Administered 2019-06-09 – 2019-06-10 (×2): 2 [IU] via SUBCUTANEOUS
  Administered 2019-06-10 – 2019-06-12 (×4): 5 [IU] via SUBCUTANEOUS
  Administered 2019-06-12: 7 [IU] via SUBCUTANEOUS
  Administered 2019-06-13 (×2): 9 [IU] via SUBCUTANEOUS
  Administered 2019-06-13: 1 [IU] via SUBCUTANEOUS
  Administered 2019-06-14: 5 [IU] via SUBCUTANEOUS
  Administered 2019-06-14: 9 [IU] via SUBCUTANEOUS
  Administered 2019-06-14: 5 [IU] via SUBCUTANEOUS

## 2019-06-06 MED ORDER — ONDANSETRON HCL 4 MG/2ML IJ SOLN: 4.0000 mg | Freq: Four times a day (QID) | INTRAMUSCULAR | Status: DC | PRN

## 2019-06-06 MED ORDER — INSULIN GLARGINE 100 UNIT/ML ~~LOC~~ SOLN
10.0000 [IU] | Freq: Every day | SUBCUTANEOUS | Status: DC
  Administered 2019-06-06: 10 [IU] via SUBCUTANEOUS
  Filled 2019-06-06 (×2): qty 0.1

## 2019-06-06 MED ORDER — FUROSEMIDE 10 MG/ML IJ SOLN
40.0000 mg | Freq: Once | INTRAMUSCULAR | Status: DC
  Filled 2019-06-06: qty 4

## 2019-06-06 MED ORDER — ACETAMINOPHEN 325 MG PO TABS: 650.0000 mg | ORAL_TABLET | Freq: Four times a day (QID) | ORAL | Status: DC | PRN

## 2019-06-06 MED ORDER — SODIUM CHLORIDE 0.9 % IV SOLN
250.0000 mL | INTRAVENOUS | Status: DC | PRN
  Administered 2019-06-07: 250 mL via INTRAVENOUS

## 2019-06-06 MED ORDER — BACID PO TABS
2.0000 | ORAL_TABLET | Freq: Three times a day (TID) | ORAL | Status: DC
  Administered 2019-06-06 (×2): 2 via ORAL
  Administered 2019-06-06: 1 via ORAL
  Administered 2019-06-06 – 2019-06-14 (×25): 2 via ORAL
  Filled 2019-06-06 (×28): qty 2

## 2019-06-06 NOTE — Progress Notes (Signed)
Called by RN that MD had taken patient off BIPAP and placed on [place]. Upon arrival patient was on 5 LPM nasal cannula with sats in the low 80s with a good waveform and HR was 119. Patient was placed back on BIPAP and settings were adjusted. Will attempted to take patient off again at a later time. RT will continue to monitor.

## 2019-06-06 NOTE — Progress Notes (Signed)
  Echocardiogram 2D Echocardiogram has been performed.  Burnett Kanaris 06/12/2019, 11:58 AM

## 2019-06-06 NOTE — Progress Notes (Signed)
Dylan Goodman notified of Critical Lactic Acid 2.9, no new orders.

## 2019-06-06 NOTE — Progress Notes (Signed)
Bipap removed per Dr Tawanna Solo order at [place], sats down to low 70s with a good pleth.  RT called and BIPAP reapplied

## 2019-06-06 NOTE — Progress Notes (Signed)
Was called by patient's RN [phone]) who informed me that patient was having more WOB and his BS were a little worse.  RN felt like patient could use a breathing treatment.  Informed RN that patient had A-fib and that Xopenex might work better when she called MD.  Council Mechanic and gave patient breathing tx.  Patient BS were crackles and wheezing.  Called MD to let him know that patient may need CXR.  CXR showed increasing fluid, especially on left.  Called Mindy, RN with [facility] to let her know what was going on.  Received call from [place] approximately 20 minutes later stating that she felt we needed to put patient on Bipap.  Patient's WOB and increased further, patient was in a panic because he could not get adequate air.  Placed patient on Bipap, 10/5, R 14, 60%.  Patient WOB is some better, patient states he can get air better.  Waiting for patient to get bed on another floor that will allow Bipap.

## 2019-06-06 NOTE — Progress Notes (Signed)
Transported patient from 4M01 to 2C06 on Bipap without incident.  Gave report prior to transport to [employer], RRT.

## 2019-06-06 NOTE — Progress Notes (Signed)
Patient arrived to 2C06 via low bed from 61M.  Patient on BiPap.  Stat EKG performed per order, revealed Afib RVR with rates 140-160. BP 105/80. [place], NP notified of patient transfer and of rhythm change.  She states she will be at bedside soon to assess patient.

## 2019-06-06 NOTE — Consult Note (Addendum)
Cardiology Consultation:   Patient ID: Dylan Goodman; [id number]; Jan 01, 1951   Admit date: 06/25/2019 Date of Consult: 06/08/2019  Primary Care Provider: Steele Sizer, MD Primary Cardiologist: Corey Skains, MD Primary Electrophysiologist:  None  Chief Complaint: shortness of breath  Patient Profile:   Dylan Goodman is a 68 y.o. 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 Tawanna Solo.  History of Present Illness:   The patient was previously followed in [place]. Hilbert Odor PA-C's outside cardiology note reviewed from 01/2019 who outlines that he has history of CABG x 4 in 1996 at a [place] hospital with subsequent stenting in 08/2013 and 09/2015 to unknown arteries, as well as heart failure with reduced ejection fraction (no echo on file at that time), and paroxysmal atrial fibrillation on anticoagulation with Eliquis. At that visit he was reporting progressive chest pressure and it appears cardiac cath was planned but then deferred due to renal dysfunction. He was admitted to [facility] in 02/2019 with dizziness in context of potential dehydration after colon prep. There was also mention of recent COPD exacerbation. He saw pulmonology in follow-up who arranged pulmonary evaluation as well as 2D echocardiogram. Outpatient 2D echo 03/27/19 showed EF 35-40%, diffuse HK, mildly elevated RVSP. He saw Dr. Nehemiah Massed back 03/31/19 for pre-op clearance for colonoscopy/endoscopy. There  was commentary that patient "may stop pletal carvedilol and or brilinta at some points to assess for cuases of diarrhea." It was not clear why he was on Brilinta (reduced dose). The patient was readmitted 03/2019 with Covid-19 pneumonitis complicated by hypoxic respiratory failure, AKI on CKD, and atrial fib with RVR. Cardiology followed from a distance. Carvedilol was changed to metoprolol. He was treated with remdesivir and Decadron.  More recently he was readmitted 05/06/19 - 06/05/19 with cardiac arrest. He initially presented to the [facility] ED with reported seizure-like behavior then suffered a cardiac arrest. He was intubated and transferred to [facility] ICU. Patient was extubated and subsequently intubated again on 10/21 and transferred to [facility] extubated on 10/24. This was felt to be sequelae of acute hypoxic respiratory failure due to COVID-19 virus. Hospital course complicated by hyoptension/possible adrenal insufficiency (requiring Solu Cortef/midodrine), persistent atrial fib, ESBL/VRE UTI with urinary retention, metabolic acidosis, diffuse ST depression on EKG, worsening kidney failure (requiring temporary CRRT), and acute on chronic systolic CHF. 2D echo on 10/9 showed EF 45-50%. Cardiology followed for atrial fib. It appears he had a brief period of conversion to NSR but reverted to atrial fibrillation so was managed with primarily rate control strategy. Diltiazem was added. After cardiology had signed off it appears amiodarone was added. Metoprolol was also discontinued with recommendation to resume when BP had stabilized. It does appear troponins were checked late October and peaked at 2,883 on 05/26/19.  Of note, Brilinta was stopped as he had hematuria with UTI. His Eliquis was also temporarily interrupted for the hematuria as well and he was on heparin up until being started on oral Eliquis around 11/5.  He was discharged yesterday to inpatient  rehab but developed respiratory  distress overnight requiring emergent transfer back to the hospital on BIPAP. Labs showed leukocytosis of 13.4, lactic acidosis of 2.9, procalcitonin 0.17, K 4.8, AKI on CKD Cr 2.26 (similar to recent baseline), abnormal LFTs with hypoalbuminemia. He got 80mg  IV Lasix before transferring from CIR with f/u Cr 2.51 today. CXR shows mild interval worsening of bilateral mixed interstitial airspace process left worse than right; asymmetric edema versus infection. 2D echo was repeated today showing EF 20-25%, moderately dilated LV, normal RV function, severe LAE, moderate TR, mild AI/PR/MR, moderately elevated PASP. He was placed on IV diltiazem drip as well as carvedilol 3.125mg  BID and written for Lasix 80mg  IV BID. His Eliquis dose has been down-adjusted to 2.5mg  BID for unclear reasons.  He is currently seen in [place] and still appears SOB. He denies any chest pain. He believes his AF was diagnosed in 2018 but does not recall prior dx of CHF.  Past Medical History:  Diagnosis Date   Abdominal aneurysm ([place]) 02/2019   4 cm infrarenal/3.8 cm saccular distal abdominal aortic aneurysm.    Adrenal insufficiency ([place])    a. possible dx in 04/2019 during complex admission.   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 h/o occluded L carotid per notes.   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/9937   Metabolic acidosis    Myocardial infarction [facility]) 2017   PAD (peripheral artery disease) (HCC)    (prior revascularization stenting and fem-pop bypass grafting   Persistent atrial fibrillation ([place])    Personal history of radiation therapy 05/30/2018   39 treatments with chemotherapy nasopharyngeal cancer   Seizure-like activity ([place])    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   VRE (vancomycin-resistant Enterococci) infection     Past Surgical History:  Procedure Laterality Date   CAROTID ENDARTERECTOMY  2006   COLONOSCOPY WITH PROPOFOL N/A 03/11/2019   Procedure: COLONOSCOPY WITH PROPOFOL;  Surgeon: Toledo, Benay Pike, MD;  Location: [facility];  Service: Gastroenterology;  Laterality: N/A;   CORONARY ARTERY BYPASS GRAFT  1996   quadruple bypass   ESOPHAGOGASTRODUODENOSCOPY (EGD) WITH PROPOFOL N/A 03/11/2019   Procedure: ESOPHAGOGASTRODUODENOSCOPY (EGD) WITH PROPOFOL;  Surgeon: Toledo, Benay Pike, MD;  Location: [facility] ENDOSCOPY;  Service: Gastroenterology;  Laterality: N/A;   FEMORAL-POPLITEAL BYPASS GRAFT Right 06/1998   11/2001, 08/2003   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   11 coils   LOWER EXTREMITY ANGIOGRAPHY Left 11/11/2018   Procedure: LOWER EXTREMITY ANGIOGRAPHY;  Surgeon: Katha Cabal, MD;  Location: [facility];  Service: Cardiovascular;  Laterality: Left;   REVISION OF AORTA BIFEMORAL BYPASS     Stent revascularization left leg  08/2009   Stent revascularization right leg  07/2009  Inpatient Medications: Scheduled Meds:  amiodarone  200 mg Oral BID   apixaban  2.5 mg Oral BID   calcitRIOL  0.25 mcg Oral Daily   carvedilol  3.125 mg Oral BID WC   furosemide  80 mg Intravenous BID   Gerhardt's butt cream   Topical QID   insulin aspart  0-5 Units Subcutaneous QHS   insulin aspart  0-9 Units Subcutaneous TID WC   insulin glargine  10 Units Subcutaneous QHS   ipratropium-albuterol  3 mL  Inhalation BID   lactobacillus acidophilus  2 tablet Oral TID   levETIRAcetam  500 mg Oral BID   Melatonin  6 mg Oral QHS   midodrine  5 mg Oral TID WC   mometasone-formoterol  2 puff Inhalation BID   pantoprazole  40 mg Oral Daily   polyethylene glycol  17 g Oral Daily   [START ON 06/17/2019] predniSONE  10 mg Oral Q breakfast   [START ON 06/14/2019] predniSONE  20 mg Oral Q breakfast   [START ON 06/11/2019] predniSONE  30 mg Oral Q breakfast   [START ON 06/08/2019] predniSONE  40 mg Oral Q breakfast   [START ON 06/20/2019] predniSONE  5 mg Oral Q breakfast   predniSONE  50 mg Oral Q breakfast   sodium bicarbonate  1,300 mg Oral BID   sodium chloride flush  3 mL Intravenous Q12H   Continuous Infusions:  sodium chloride     diltiazem (CARDIZEM) infusion 5 mg/hr (06/12/2019 0630)   PRN Meds: sodium chloride, acetaminophen, LORazepam, ondansetron (ZOFRAN) IV, sodium chloride flush  Home Meds: Prior to Admission medications   Medication Sig Start Date End Date Taking? Authorizing Provider  acetaminophen (TYLENOL) 325 MG tablet Take 2 tablets (650 mg total) by mouth every 4 (four) hours as needed for mild pain. 06/05/19   Shahmehdi, Valeria Batman, MD  Amino Acids-Protein Hydrolys (FEEDING SUPPLEMENT, PRO-STAT SUGAR FREE 64,) LIQD Take 30 mLs by mouth 2 (two) times daily. 06/05/19   Shahmehdi, Valeria Batman, MD  amiodarone (PACERONE) 200 MG tablet Take 1 tablet (200 mg total) by mouth 2 (two) times daily. 06/05/19 07/05/19  Shahmehdi, Valeria Batman, MD  apixaban (ELIQUIS) 5 MG TABS tablet Take 1 tablet (5 mg total) by mouth 2 (two) times daily. 06/05/19   Shahmehdi, Valeria Batman, MD  calcitRIOL (ROCALTROL) 0.25 MCG capsule Take 1 capsule (0.25 mcg total) by mouth daily. 06/05/19 07/05/19  ShahmehdiValeria Batman, MD  Calcium Carbonate-Vitamin D3 (CALCIUM 600-D) 600-400 MG-UNIT TABS Take 1 tablet by mouth daily.    [provider]  Chlorhexidine Gluconate Cloth 2 % PADS Apply 6 each topically daily  for 10 days. 06/05/19 2019/07/11  ShahmehdiValeria Batman, MD  Cholecalciferol (VITAMIN D3) 25 MCG (1000 UT) CAPS Take by mouth.    [provider]  cilostazol (PLETAL) 100 MG tablet Take 100 mg by mouth 2 (two) times daily.     [provider]  diltiazem (CARDIZEM CD) 240 MG 24 hr capsule Take 1 capsule (240 mg total) by mouth daily. 06/05/19 07/05/19  Deatra Cayton, MD  HYDROcodone-acetaminophen (NORCO/VICODIN) 5-325 MG tablet Take 1-2 tablets by mouth every 4 (four) hours as needed for moderate pain. 06/05/19   Shahmehdi, Valeria Batman, MD  insulin aspart (NOVOLOG) 100 UNIT/ML injection Inject 0-5 Units into the skin at bedtime. 06/05/19   Shahmehdi, Valeria Batman, MD  insulin aspart (NOVOLOG) 100 UNIT/ML injection Inject 0-9 Units into the skin 3 (three) times daily with meals. 06/05/19   Shahmehdi, Valeria Batman,  MD  insulin glargine (LANTUS) 100 UNIT/ML injection Inject 0.12 mLs (12 Units total) into the skin at bedtime. 06/05/19   Shahmehdi, Valeria Batman, MD  ipratropium-albuterol (DUONEB) 0.5-2.5 (3) MG/3ML SOLN Inhale 3 mLs into the lungs 2 (two) times daily. 06/05/19 07/05/19  Deatra Doye, MD  lactobacillus acidophilus (BACID) TABS tablet Take 2 tablets by mouth 3 (three) times daily for 15 days. 06/05/19 06/20/19  ShahmehdiValeria Batman, MD  levETIRAcetam (KEPPRA) 500 MG tablet Take 1 tablet (500 mg total) by mouth 2 (two) times daily. 06/05/19 07/05/19  Shahmehdi, Valeria Batman, MD  linezolid (ZYVOX) 600 MG tablet Take 1 tablet (600 mg total) by mouth every 12 (twelve) hours. 06/05/19   Shahmehdi, Valeria Batman, MD  lovastatin (MEVACOR) 40 MG tablet Take 1 tablet (40 mg total) by mouth at bedtime. 06/05/19 07/05/19  ShahmehdiValeria Batman, MD  Melatonin 3 MG TABS Take 2 tablets (6 mg total) by mouth at bedtime. 06/05/19 07/05/19  Shahmehdi, Valeria Batman, MD  midodrine (PROAMATINE) 5 MG tablet Take 1 tablet (5 mg total) by mouth 3 (three) times daily with meals. 06/05/19 07/05/19  Shahmehdi, Valeria Batman, MD  mometasone-formoterol  (DULERA) 200-5 MCG/ACT AERO Inhale 2 puffs into the lungs 2 (two) times daily. 06/05/19 07/05/19  ShahmehdiValeria Batman, MD  nitroGLYCERIN (NITROSTAT) 0.4 MG SL tablet Place 0.4 mg under the tongue every 5 (five) minutes as needed for chest pain.  07/01/18   [provider]  pantoprazole (PROTONIX) 40 MG tablet Take 1 tablet (40 mg total) by mouth daily for 15 days. 06/05/19 06/20/19  Shahmehdi, Valeria Batman, MD  polyethylene glycol (MIRALAX / GLYCOLAX) 17 g packet Take 17 g by mouth daily as needed for moderate constipation. 06/05/19   Shahmehdi, Valeria Batman, MD  predniSONE (DELTASONE) 10 MG tablet Take 1 tablet (10 mg total) by mouth daily for 3 days, THEN 0.5 tablets (5 mg total) daily for 3 days. 06/05/19 06/11/19  ShahmehdiValeria Batman, MD  sodium bicarbonate 650 MG tablet Take 1 tablet (650 mg total) by mouth 2 (two) times daily. 06/05/19 07/05/19  Deatra Derral, MD    Allergies:   No Known Allergies  Social History:   Social History   Socioeconomic History   Marital status: Married    Spouse name: Not on file   Number of children: 1   Years of education: Not on file   Highest education level: 11th grade  Occupational History   Occupation: retired   Scientist, 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 at [employer]    Worry: Never true    Inability: Never true   Transportation needs    Medical: No    Non-medical: No  Tobacco Use   Smoking status: Former Smoker    Packs/day: 1.50    Years: 50.00    Pack years: 75.00    Types: Cigarettes    Quit date: 02/18/2017    Years since quitting: 2.2   Smokeless tobacco: Never Used  Substance and Sexual Activity   Alcohol use: Not Currently   Drug use: Never   Sexual activity: Yes    Partners: Female  Lifestyle   Physical activity    Days per week: 0 days    Minutes per session: 0 min   Stress: Not at all  Relationships   Social connections    Talks on phone: Twice a week    Gets together: Once a week     Attends religious service: Never  Active member of club or organization: No    Attends meetings of clubs or organizations: Never    Relationship status: Married   Intimate partner violence    Fear of current or ex partner: No    Emotionally abused: No    Physically abused: No    Forced sexual activity: No  Other Topics Concern   Not on file  Social History Narrative   Moved here from [place], in 2019, re-married 03/2018   Had one son from previous marriage, he died one day after Christmas, on MVA at age 89 yo     Family History:    Family History  Problem Relation Age of Onset   Coronary artery disease Mother    Alcohol abuse Father    Colon cancer Father    Coronary artery disease Father    Kidney cancer Father    Breast cancer Sister       ROS:  Please see the history of present illness.  All other ROS reviewed and negative.     Physical Exam/Data:   Vitals:   06/13/2019 1000 06/13/2019 1047 06/14/2019 1200 06/28/2019 1201  BP: 111/70 108/83 116/77   Pulse: 96 88 (!) 107 (!) 106  Resp: 17 19 20    Temp: (!) 97.3 F (36.3 C) 98.3 F (36.8 C)    TempSrc: Axillary Axillary    SpO2: 93% 95% 97%     Intake/Output Summary (Last 24 hours) at 06/17/2019 1439 Last data filed at 06/04/2019 0630 Gross per 24 hour  Intake 16.2 ml  Output --  Net 16.2 ml   Last 3 Weights 06/05/2019 06/01/2019 05/31/2019  Weight (lbs) 205 lb 193 lb 6.4 oz 192 lb 8 oz  Weight (kg) 92.987 kg 87.726 kg 87.317 kg     There is no height or weight on file to calculate BMI.  General: Well developed, well nourished WM in no acute distress. Head: Normocephalic, atraumatic, sclera non-icteric, no xanthomas, nares are without discharge Neck:  JVD not elevated. Lungs: Moderately increased WOB with diffusely coarse BS on Venti Mask. Heart: Irregularly irregular, tachycaridc, with S1 S2. No murmurs, rubs, or gallops appreciated. Abdomen: Soft, non-tender, non-distended with normoactive bowel sounds. No  hepatomegaly. No rebound/guarding. No obvious abdominal masses. Msk: Mild generalized atrophy Extremities: No clubbing or cyanosis. No edema.  Distal pedal pulses are 2+ and equal bilaterally. Neuro: Alert and oriented X 3. No facial asymmetry. No focal deficit. Moves all extremities spontaneously. Psych:  Responds to questions appropriately with a normal affect.   EKG:  The EKG was personally reviewed and demonstrates:  atrial fib 157bpm, low voltage QRS, diffuse nonspecific STT changes with ST depression/TWI I, II, avF, V3-V6   Relevant CV Studies: Today's Echocardiogram IMPRESSIONS    1. Left ventricular ejection fraction, by visual estimation, is 20 to 25%. The left ventricle has severely decreased function. Left ventricular septal wall thickness was normal. There is no left ventricular hypertrophy.  2. Left ventricular diastolic parameters are indeterminate.  3. Moderately dilated left ventricular internal cavity size.  4. Diffuse hypokinesis EF has decreased since echo done 05/08/19.  5. Global right ventricle has normal systolic function.The right ventricular size is normal. No increase in right ventricular wall thickness.  6. Left atrial size was severely dilated.  7. Right atrial size was normal.  8. The mitral valve is normal in structure. Mild mitral valve regurgitation.  9. The tricuspid valve is normal in structure. Tricuspid valve regurgitation moderate. 10. The aortic valve is tricuspid. Aortic  valve regurgitation is mild. Mild to moderate aortic valve sclerosis/calcification without any evidence of aortic stenosis. 11. The pulmonic valve was grossly normal. Pulmonic valve regurgitation is mild. 12. Moderately elevated pulmonary artery systolic pressure. 13. The interatrial septum was not well visualized.   Laboratory Data:  High Sensitivity Troponin:   Recent Labs  Lab 05/24/19 1630 05/24/19 1845 05/26/19 0650 05/27/19 0615  TROPONINIHS 261* 774* 2,883* 1,611*      Cardiac EnzymesNo results for input(s): TROPONINI in the last 168 hours. No results for input(s): TROPIPOC in the last 168 hours.  Chemistry Recent Labs  Lab 06/04/19 0337 06/05/19 0349 06/17/2019 0501  NA 142 144 143  K 3.9 4.2 4.8  CL 110 115* 114*  CO2 21* 19* 18*  GLUCOSE 134* 140* 196*  BUN 53* 49* 47*  CREATININE 2.28* 2.26* 2.51*  CALCIUM 7.0* 6.8* 6.9*  GFRNONAA 29* 29* 26*  GFRAA 34* 34* 30*  ANIONGAP 11 10 11     Recent Labs  Lab 06/25/2019 0501  PROT 4.9*  ALBUMIN 2.5*  AST 47*  ALT 43  ALKPHOS 269*  BILITOT 1.3*   Hematology Recent Labs  Lab 06/04/19 0337 06/05/19 0349 06/19/2019 0501  WBC 8.1 8.0 13.4*  RBC 3.48* 3.36* 3.81*  HGB 10.6* 10.5* 11.7*  HCT 35.1* 34.6* 40.8  MCV 100.9* 103.0* 107.1*  MCH 30.5 31.3 30.7  MCHC 30.2 30.3 28.7*  RDW 19.9* 20.3* 20.2*  PLT 108* 127* 185   BNPNo results for input(s): BNP, PROBNP in the last 168 hours.  DDimer No results for input(s): DDIMER in the last 168 hours.   Radiology/Studies:  Dg Chest Port 1 View  Result Date: 06/25/2019 CLINICAL DATA:  Respiratory distress. EXAM: PORTABLE CHEST 1 VIEW COMPARISON:  05/23/2019 FINDINGS: Sternotomy wires unchanged. Lungs are adequately inflated demonstrate slight interval worsening of diffuse bilateral mixed interstitial airspace density most prominent over the central lungs left greater than right. Findings may be due to asymmetric edema versus infection. Possible small amount left pleural fluid. No evidence of pneumothorax. Mild cardiomegaly. Remainder of the exam is unchanged. IMPRESSION: Mild cardiomegaly with mild interval worsening of bilateral mixed interstitial airspace process left worse than right. Findings may be due to asymmetric edema versus infection. Possible small amount left pleural fluid. Electronically Signed   By: Marin Olp M.D.   On: 06/11/2019 04:04    Assessment and Plan:   1. Acute recurrent hypoxic respiratory failure likely multifactorial  but with component of acute on chronic systolic CHF and worsening cardiomyopathy - agree with plan for diuresis - need to watch renal function carefully. Plan to discuss complex case with Dr. Rayann Heman. Given his recent troponin bump and worsening LV function we cannot exclude worsening CAD but at present time he is poor candidate for invasive management. Would recommend to optimize volume and rhythm as priorities. Cannot use ACEi/ARB/ARNI/spiro given renal function and BP has been prohibitive of aggressive medication titration otherwise.  2. Persistent atrial fib with RVR - Recent course complicated by numerous medication changes and anticoagulation interruption. Briefly in NSR earlier in October but did not hold; managed with rate control strategy thereafter. Was then started on amiodarone on 10/27 per EMR. I would think carvedilol would be less ideal given his h/o hypotension. Diltiazem doesn't seem to be doing much (and now that LVEF has dropped is less ideal again). Furthermore, need to clarify anticoagulation dose. He technically qualifies for Eliquis 5mg  BID but would require lower dose Xarelto. Might make more sense to use the  lower dose Xarelto with consistency. Will review management with MD  3. AKI on CKD stage IV - follow carefully with management. Consider nephrology consultation to follow along inpatient.  4. Recent Covid-19 infection with sequelae - IM involved.  5. H/o CAD s/p remote CABG, PCI (last in 2017 per pt) - not currently a good candidate for ischemic eval given recent renal failure. Not on ASA given need for ongoing anticoagulation and Eliquis. No chest pain.  6. Diffuse PAD (lower extremity and carotid) - not currently on statin, this can be re-visited when acute issues settle down.  For questions or updates, please contact [place] Please consult [email or web address] for contact info under   Signed, Charlie Pitter, PA-C  06/13/2019 2:39 PM   I have seen, examined the patient,  and reviewed the above assessment and plan.  Changes to above are made where necessary.  On exam, very ill appearing,  SOB, tachycardic irregular rhythm.  The patient is quite ill following a prolonged hospitalization.  He now has decompensated congestive heart failure in the setting of afib with RVR.  He is on a subtherapeutic dose of eliquis and therefore cannot be cardioverted currently.  Will switch to xarelto which will allow appropriate dosing based on creatinine clearance.  Start IV amiodarone over the weekend for rate control.  IV diuresis.  Hopefully will be stable for TEE guided cardioversion on Monday.  Co Sign: Thompson Grayer, MD 06/23/2019 4:07 PM

## 2019-06-06 NOTE — Progress Notes (Signed)
Patient is a 68 year old male with history of congestive heart failure, atrial fibrillation, diabetes mellitus, CKD with baseline creatinine around 2 who was sent from inpatient rehab for the evaluation of acute respiratory distress.  He was just discharged from here to inpatient rehab yesterday.  He developed respiratory distress overnight and had to be kept on BiPAP.  Admitted for the management of acute on chronic CHF exacerbation.he was also noted to have peripheral edema, diffuse Rales.  Chest x-ray concerning for pulmonary edema.  Patient has also been seen by nephrology.  Started on Lasix 80 mg IV twice a day for now.  We will continue to monitor output, input, daily weight. Patient seen and examined the bedside this morning.  He was still on BiPAP.  He looked comfortable during my evaluation and denies any shortness of breath.  When BiPAP was stopped, he rapidly desaturated and had to be put back on. We will continue to monitor the patient.  Patient seen by Dr. Myna Hidalgo this morning.

## 2019-06-06 NOTE — H&P (Signed)
History and Physical    Dylan Goodman MVH:[id number] DOB: Oct 10, 1950 DOA: (Not on file)  PCP: Steele Sizer, MD   Patient coming from: Inpatient Rehabilitation   Chief Complaint: Acute respiratory distress   HPI: Dylan Goodman is a 68 y.o. male with medical history significant for atrial fibrillation on Eliquis, coronary artery disease, chronic systolic CHF, chronic kidney disease stage IV, and insulin-dependent diabetes mellitus who was discharged from the hospital to inpatient rehabilitation yesterday evening and then developed respiratory distress overnight.  Patient denies chest pain, fevers, chills, or cough.  He reports he was breathing seemed to worsen fairly quickly overnight.  His history is limited by respiratory distress on BiPAP.  Respiratory therapy was called to the bedside, chest x-ray was obtained concerning for edema versus infection, patient was placed on BiPAP, IV Lasix was ordered by the inpatient rehabilitation attending, and hospitalist were consulted.    Recent hospital course:  Patient suffered a cardiac arrest on 05/06/2019, was admitted to the ICU.  He had a complicated hospital course involving respiratory failure secondary to COVID-19 infection and acute on chronic CHF, ESBL UTI, acute kidney injury superimposed on CKD 4, and possible adrenal insufficiency.  He completed treatment with remdesivir and Decadron for COVID-19 and tested negative a week ago.  He completed antibiotics for his UTI.  He required intermittent dialysis but not in the past week and the dialysis catheter has been removed.   Review of Systems:  Unable to complete ROS secondary to the patient's clinical condition.  Past Medical History:  Diagnosis Date  . Abdominal aneurysm ([place]) 02/2019   4 cm infrarenal/3.8 cm saccular distal abdominal aortic aneurysm.   Marland Kitchen Atrial fibrillation ([place])   . Bleeding gastrointestinal   . BPH (benign prostatic hyperplasia)   . CAD (coronary artery  disease)    Status post CABG and PCI  . 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 Cardiomyopathy ([place])   . Chronic depression   . CKD (chronic kidney disease)   . Diabetes mellitus ([place])   . Gastric ulcer with hemorrhage   . Gastritis   . GERD (gastroesophageal reflux disease)   . Hx of CABG 12/1994  . IDA (iron deficiency anemia)   . 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 Myocardial infarction ([place]) 2017  . Personal history of radiation therapy 05/30/2018   39 treatments with chemotherapy nasopharyngeal cancer  . 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 CAROTID ENDARTERECTOMY  2006  . COLONOSCOPY WITH PROPOFOL N/A 03/11/2019   Procedure: COLONOSCOPY WITH PROPOFOL;  Surgeon: Toledo, Benay Pike, MD;  Location: [facility] ENDOSCOPY;  Service: Gastroenterology;  Laterality: N/A;  . CORONARY ARTERY BYPASS GRAFT  1996   quadruple bypass  . ESOPHAGOGASTRODUODENOSCOPY (EGD) WITH PROPOFOL N/A 03/11/2019   Procedure: ESOPHAGOGASTRODUODENOSCOPY (EGD) WITH PROPOFOL;  Surgeon: Toledo, Benay Pike, MD;  Location: [facility] ENDOSCOPY;  Service: Gastroenterology;  Laterality: N/A;  . FEMORAL-POPLITEAL BYPASS GRAFT Right 06/1998   11/2001, 08/2003  . 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 LOWER EXTREMITY ANGIOGRAPHY Left 11/11/2018   Procedure: LOWER EXTREMITY ANGIOGRAPHY;  Surgeon: Katha Cabal, MD;  Location: [facility];  Service: Cardiovascular;  Laterality: Left;  . REVISION OF AORTA BIFEMORAL BYPASS    . Stent revascularization left leg  08/2009  . Stent revascularization right leg  07/2009     reports that he quit smoking  about 2 years ago. His smoking use included cigarettes. He has a 75.00 pack-year smoking history. He has never used smokeless tobacco. He reports previous alcohol use. He reports that he does not use drugs.  No Known Allergies  Family History  Problem Relation Age  of Onset  . Coronary artery disease Mother   . Alcohol abuse Father   . Colon cancer Father   . Coronary artery disease Father   . Kidney cancer Father   . Breast cancer Sister      Prior to Admission medications   Medication Sig Start Date End Date Taking? Authorizing Provider  acetaminophen (TYLENOL) 325 MG tablet Take 2 tablets (650 mg total) by mouth every 4 (four) hours as needed for mild pain. 06/05/19   Shahmehdi, Valeria Batman, MD  Amino Acids-Protein Hydrolys (FEEDING SUPPLEMENT, PRO-STAT SUGAR FREE 64,) LIQD Take 30 mLs by mouth 2 (two) times daily. 06/05/19   Shahmehdi, Valeria Batman, MD  amiodarone (PACERONE) 200 MG tablet Take 1 tablet (200 mg total) by mouth 2 (two) times daily. 06/05/19 07/05/19  Shahmehdi, Valeria Batman, MD  apixaban (ELIQUIS) 5 MG TABS tablet Take 1 tablet (5 mg total) by mouth 2 (two) times daily. 06/05/19   Shahmehdi, Valeria Batman, MD  calcitRIOL (ROCALTROL) 0.25 MCG capsule Take 1 capsule (0.25 mcg total) by mouth daily. 06/05/19 07/05/19  ShahmehdiValeria Batman, MD  Calcium Carbonate-Vitamin D3 (CALCIUM 600-D) 600-400 MG-UNIT TABS Take 1 tablet by mouth daily.    [provider]  Chlorhexidine Gluconate Cloth 2 % PADS Apply 6 each topically daily for 10 days. 06/05/19 2019/07/15  ShahmehdiValeria Batman, MD  Cholecalciferol (VITAMIN D3) 25 MCG (1000 UT) CAPS Take by mouth.    [provider]  cilostazol (PLETAL) 100 MG tablet Take 100 mg by mouth 2 (two) times daily.     [provider]  diltiazem (CARDIZEM CD) 240 MG 24 hr capsule Take 1 capsule (240 mg total) by mouth daily. 06/05/19 07/05/19  Deatra Zaidan, MD  HYDROcodone-acetaminophen (NORCO/VICODIN) 5-325 MG tablet Take 1-2 tablets by mouth every 4 (four) hours as needed for moderate pain. 06/05/19   Shahmehdi, Valeria Batman, MD  insulin aspart (NOVOLOG) 100 UNIT/ML injection Inject 0-5 Units into the skin at bedtime. 06/05/19   Shahmehdi, Valeria Batman, MD  insulin aspart (NOVOLOG) 100 UNIT/ML injection Inject 0-9 Units  into the skin 3 (three) times daily with meals. 06/05/19   Shahmehdi, Valeria Batman, MD  insulin glargine (LANTUS) 100 UNIT/ML injection Inject 0.12 mLs (12 Units total) into the skin at bedtime. 06/05/19   Shahmehdi, Valeria Batman, MD  ipratropium-albuterol (DUONEB) 0.5-2.5 (3) MG/3ML SOLN Inhale 3 mLs into the lungs 2 (two) times daily. 06/05/19 07/05/19  Deatra Brylon, MD  lactobacillus acidophilus (BACID) TABS tablet Take 2 tablets by mouth 3 (three) times daily for 15 days. 06/05/19 06/20/19  ShahmehdiValeria Batman, MD  levETIRAcetam (KEPPRA) 500 MG tablet Take 1 tablet (500 mg total) by mouth 2 (two) times daily. 06/05/19 07/05/19  Shahmehdi, Valeria Batman, MD  linezolid (ZYVOX) 600 MG tablet Take 1 tablet (600 mg total) by mouth every 12 (twelve) hours. 06/05/19   Shahmehdi, Valeria Batman, MD  lovastatin (MEVACOR) 40 MG tablet Take 1 tablet (40 mg total) by mouth at bedtime. 06/05/19 07/05/19  ShahmehdiValeria Batman, MD  Melatonin 3 MG TABS Take 2 tablets (6 mg total) by mouth at bedtime. 06/05/19 07/05/19  Shahmehdi, Valeria Batman, MD  midodrine (PROAMATINE) 5 MG tablet Take 1 tablet (5 mg  total) by mouth 3 (three) times daily with meals. 06/05/19 07/05/19  Shahmehdi, Valeria Batman, MD  mometasone-formoterol (DULERA) 200-5 MCG/ACT AERO Inhale 2 puffs into the lungs 2 (two) times daily. 06/05/19 07/05/19  ShahmehdiValeria Batman, MD  nitroGLYCERIN (NITROSTAT) 0.4 MG SL tablet Place 0.4 mg under the tongue every 5 (five) minutes as needed for chest pain.  07/01/18   [provider]  pantoprazole (PROTONIX) 40 MG tablet Take 1 tablet (40 mg total) by mouth daily for 15 days. 06/05/19 06/20/19  Shahmehdi, Valeria Batman, MD  polyethylene glycol (MIRALAX / GLYCOLAX) 17 g packet Take 17 g by mouth daily as needed for moderate constipation. 06/05/19   Shahmehdi, Valeria Batman, MD  predniSONE (DELTASONE) 10 MG tablet Take 1 tablet (10 mg total) by mouth daily for 3 days, THEN 0.5 tablets (5 mg total) daily for 3 days. 06/05/19 06/11/19  ShahmehdiValeria Batman, MD  sodium  bicarbonate 650 MG tablet Take 1 tablet (650 mg total) by mouth 2 (two) times daily. 06/05/19 07/05/19  Deatra Xzavion, MD    Physical Exam: There were no vitals filed for this visit.  Constitutional: Tachypneic, no diaphoresis, no pallor  Eyes: PERTLA, lids and conjunctivae normal ENMT: Mucous membranes are moist. Posterior pharynx clear of any exudate or lesions.   Neck: normal, supple, no masses, no thyromegaly Respiratory: Labored respirations. Diffuse rales. No pallor or cyanosis.   Cardiovascular: Rate ~120 and regular. Presacral and extremity edema.   Abdomen: No distension, no tenderness, soft. Bowel sounds active.  Musculoskeletal: no clubbing / cyanosis. No joint deformity upper and lower extremities.  Skin: no significant rashes, lesions, ulcers. Warm, dry, well-perfused. Neurologic: No gross facial asymmetry. Sensation intact. Moving all extremities.  Psychiatric: Difficult to assess given clinical condition with respiratory distress.    Labs on Admission: I have personally reviewed following labs and imaging studies  CBC: Recent Labs  Lab 06/01/19 0637 06/02/19 0406 06/03/19 0016 06/04/19 0337 06/05/19 0349  WBC 10.4 7.8 8.0 8.1 8.0  HGB 11.7* 10.2* 10.3* 10.6* 10.5*  HCT 38.5* 33.6* 33.5* 35.1* 34.6*  MCV 100.0 100.6* 99.7 100.9* 103.0*  PLT 126* 96* 89* 108* 629*   Basic Metabolic Panel: Recent Labs  Lab 06/01/19 0637 06/02/19 0406 06/03/19 0016 06/04/19 0337 06/05/19 0349  NA 140 140 137 142 144  K 3.5 3.8 2.8* 3.9 4.2  CL 105 105 106 110 115*  CO2 21* 21* 21* 21* 19*  GLUCOSE 128* 169* 192* 134* 140*  BUN 61* 57* 57* 53* 49*  CREATININE 2.67* 2.65* 2.42* 2.28* 2.26*  CALCIUM 8.2* 7.6* 7.0* 7.0* 6.8*  PHOS  --   --   --  3.3  --    GFR: Estimated Creatinine Clearance: 38 mL/min (A) (by C-G formula based on SCr of 2.26 mg/dL (H)). Liver Function Tests: No results for input(s): AST, ALT, ALKPHOS, BILITOT, PROT, ALBUMIN in the last 168 hours. No  results for input(s): LIPASE, AMYLASE in the last 168 hours. No results for input(s): AMMONIA in the last 168 hours. Coagulation Profile: No results for input(s): INR, PROTIME in the last 168 hours. Cardiac Enzymes: No results for input(s): CKTOTAL, CKMB, CKMBINDEX, TROPONINI in the last 168 hours. BNP (last 3 results) No results for input(s): PROBNP in the last 8760 hours. HbA1C: No results for input(s): HGBA1C in the last 72 hours. CBG: Recent Labs  Lab 06/05/19 0816 06/05/19 1146 06/05/19 1713 06/05/19 1728 06/05/19 1755  GLUCAP 106* 108* 61* 53* 101*   Lipid Profile: No  results for input(s): CHOL, HDL, LDLCALC, TRIG, CHOLHDL, LDLDIRECT in the last 72 hours. Thyroid Function Tests: No results for input(s): TSH, T4TOTAL, FREET4, T3FREE, THYROIDAB in the last 72 hours. Anemia Panel: No results for input(s): VITAMINB12, FOLATE, FERRITIN, TIBC, IRON, RETICCTPCT in the last 72 hours. Urine analysis:    Component Value Date/Time   COLORURINE YELLOW 06/04/2019 0721   APPEARANCEUR HAZY (A) 06/04/2019 0721   LABSPEC 1.013 06/04/2019 0721   PHURINE 5.0 06/04/2019 0721   GLUCOSEU 50 (A) 06/04/2019 0721   HGBUR LARGE (A) 06/04/2019 0721   BILIRUBINUR NEGATIVE 06/04/2019 0721   KETONESUR NEGATIVE 06/04/2019 0721   PROTEINUR 30 (A) 06/04/2019 0721   NITRITE NEGATIVE 06/04/2019 0721   LEUKOCYTESUR LARGE (A) 06/04/2019 0721   Sepsis Labs: @LABRCNTIP (procalcitonin:4,lacticidven:4) ) Recent Results (from the past 240 hour(s))  Culture, Urine     Status: Abnormal   Collection Time: 05/29/19  3:14 PM   Specimen: Urine, Random  Result Value Ref Range Status   Specimen Description   Final    URINE, RANDOM Performed at [facility], [place] [address] [place], [place] [address]    Special Requests   Final    NONE Performed at [facility], [place] [address] [place], [place] [address]    Culture (A)  Final    >=100,000 COLONIES/mL KLEBSIELLA  PNEUMONIAE Confirmed Extended Spectrum Beta-Lactamase Producer (ESBL).  In bloodstream infections from ESBL organisms, carbapenems are preferred over piperacillin/tazobactam. They are shown to have a lower risk of mortality. >=100,000 COLONIES/mL VANCOMYCIN RESISTANT ENTEROCOCCUS ISOLATED    Report Status 06/01/2019 FINAL  Final   Organism ID, Bacteria KLEBSIELLA PNEUMONIAE (A)  Final   Organism ID, Bacteria VANCOMYCIN RESISTANT ENTEROCOCCUS ISOLATED (A)  Final      Susceptibility   Klebsiella pneumoniae - MIC*    AMPICILLIN >=32 RESISTANT Resistant     CEFAZOLIN >=64 RESISTANT Resistant     CEFTRIAXONE >=64 RESISTANT Resistant     CIPROFLOXACIN <=0.25 SENSITIVE Sensitive     GENTAMICIN <=1 SENSITIVE Sensitive     IMIPENEM <=0.25 SENSITIVE Sensitive     NITROFURANTOIN 64 INTERMEDIATE Intermediate     TRIMETH/SULFA >=320 RESISTANT Resistant     AMPICILLIN/SULBACTAM >=32 RESISTANT Resistant     PIP/TAZO 8 SENSITIVE Sensitive     Extended ESBL POSITIVE Resistant     * >=100,000 COLONIES/mL KLEBSIELLA PNEUMONIAE   Vancomycin resistant enterococcus isolated - MIC*    AMPICILLIN >=32 RESISTANT Resistant     LEVOFLOXACIN >=8 RESISTANT Resistant     NITROFURANTOIN 64 INTERMEDIATE Intermediate     VANCOMYCIN >=32 RESISTANT Resistant     LINEZOLID 2 SENSITIVE Sensitive     * >=100,000 COLONIES/mL VANCOMYCIN RESISTANT ENTEROCOCCUS ISOLATED  Novel Coronavirus, NAA (Hosp order, Send-out to Ref Lab; TAT 18-24 hrs     Status: None   Collection Time: 05/30/19  6:41 AM   Specimen: Nasopharyngeal Swab; Respiratory  Result Value Ref Range Status   SARS-CoV-2, NAA NOT DETECTED NOT DETECTED Final    Comment: (NOTE) This nucleic acid amplification test was developed and its performance characteristics determined by [employer]. Nucleic acid amplification tests include PCR and TMA. This test has not been FDA cleared or approved. This test has been authorized by FDA under an Emergency Use  Authorization (EUA). This test is only authorized for the duration of time the declaration that circumstances exist justifying the authorization of the emergency use of in vitro diagnostic tests for detection of SARS-CoV-2 virus and/or diagnosis of COVID-19  infection under section 564(b)(1) of the Act, 21 U.S.C. 932IZT-2(W) (1), unless the authorization is terminated or revoked sooner. When diagnostic testing is negative, the possibility of a false negative result should be considered in the context of a patient's recent exposures and the presence of clinical signs and symptoms consistent with COVID-19. An individual without symptoms of COVID- 19 and who is not shedding SARS-CoV-2 vi rus would expect to have a negative (not detected) result in this assay. Performed At: [facility] [address] [place], [place] [id number] Rush Farmer MD SN:[id number]    [place]  Final    Comment: Performed at [facility], [place] [address] [place], [place] [address]  Urine culture     Status: None (Preliminary result)   Collection Time: 06/05/19  5:45 PM   Specimen: Urine, Catheterized  Result Value Ref Range Status   Specimen Description URINE, CATHETERIZED  Final   Special Requests   Final    PATIENT ON FOLLOWING ZYVOX Performed at [facility], [place] [address], [place], [place] [address]    Culture PENDING  Incomplete   Report Status PENDING  Incomplete     Radiological Exams on Admission: Dg Chest Port 1 View  Result Date: 06/13/2019 CLINICAL DATA:  Respiratory distress. EXAM: PORTABLE CHEST 1 VIEW COMPARISON:  05/23/2019 FINDINGS: Sternotomy wires unchanged. Lungs are adequately inflated demonstrate slight interval worsening of diffuse bilateral mixed interstitial airspace density most prominent over the central lungs left greater than right. Findings may be due to asymmetric edema versus infection. Possible small amount left pleural fluid. No  evidence of pneumothorax. Mild cardiomegaly. Remainder of the exam is unchanged. IMPRESSION: Mild cardiomegaly with mild interval worsening of bilateral mixed interstitial airspace process left worse than right. Findings may be due to asymmetric edema versus infection. Possible small amount left pleural fluid. Electronically Signed   By: Marin Olp M.D.   On: 06/08/2019 04:04    EKG: Ordered, not yet performed.   Assessment/Plan   1. Acute on chronic systolic CHF; acute hypoxic respiratory failure - Patient developed respiratory distress overnight, had echo with EF 40-45% one month ago, is noted to have peripheral edema, diffuse rales, and CXR findings concerning for pulmonary edema  - He has been started on BiPAP by RT  - He denies chest pain, cough, fever/chills  - He is receiving Lasix 80 mg IV now  - Admit to progressive unit, STAT EKG and labs, continue diuresis with Lasix 40 mg IV q12h (adjust according to response), cardiac monitoring, strict I/O's, daily weights, daily chem panel during diuresis    2. CKD stage IV; metabolic acidosis    - SCr was 2.26 on 11/6, close to apparent baseline of ~2  - He had been requiring intermittent dialysis up until a week ago; dialysis catheter has been removed  - Renally-dose medications, monitor renal function and electrolytes during diuresis, continue bicarbonate    3. Atrial fibrillation - He is tachycardic in 120's on admission, not on monitor, seems to be regular, EKG pending  - CHADS-VASc is 81 (age, CHF, DM, CAD)  - Continue Eliquis, amiodarone, and diltiazem    4. Insulin-dependent DM  - A1c was 6.7% in August 2020  - Managed with insulin at home  - Continue Lantus with Novolog sliding-scale    5. Seizure  - History of single seizure just prior to recent admission  - Continue Keppra    6. Hypotension; ?adrenal insufficiency  - Continue midodrine, continue to  taper steroids    PPE: mask, face shield  DVT prophylaxis: Eliquis   Code Status: Full  Family Communication: Discussed with patient  Consults called: None  Admission status: Inpatient     Vianne Bulls, MD [facility] Pager [phone]  If 7PM-7AM, please contact night-coverage [email or web address] Password [email or web address]  06/02/2019, 5:06 AM

## 2019-06-06 NOTE — IPOC Note (Signed)
Pt transferred <24 hours post admission.  No evals completed.

## 2019-06-06 NOTE — Progress Notes (Signed)
[facility] NEPHROLOGY PROGRESS NOTE  Assessment/ Plan: Pt is a 68 y.o. yo male male with CHF, A. fib, DM, CKD with baseline creatinine around 2 admitted to [facility] from 9/14-9/19, readmitted on 10/6 status post cardiac arrest and suffered AKI on CKD required CRRT till 10/29.  #AKI on CKD, baseline creatinine around 2.  AKI likely ATN after cardiac arrest and concomitant with COVID-19 infection.  Initially treated with Lasix and then started CRRT from 10/24-10/29. -Patient had renal recovery therefore the HD catheter was discontinued on 11/6.  Overnight he went into respiratory distress and moved to medical floor from rehab.  He is currently on BiPAP.  Agree with IV diuretics increase the dose of Lasix to 80 mg IV twice a day.  We need a strict ins and out.  Discussed with nurse. -Increasing urine output with stable serum creatinine level of 2.2.  His volume status   #Hypokalemia: Improved.   #Metabolic acidosis: Increase sodium bicarbonate dose.  Monitor lab.  #Acute hypoxic respiratory failure/pneumonia due to COVID-19 virus: Completed course of remdesivir and steroid.  Initially clinically improved.  Now with possible fluid overload.  #Hypotension/possible adrenal insufficiency: On  steroid and midodrine.  Blood pressure is improved.  #ESBL UTI possible acute urinary retention: Meropenem since 11/1.  Urology following for some blood clot during urination.  Need outpatient follow-up with urology per intraurethral catheter.  #Acute on chronic systolic CHF: LVEF 40 to 81%.   Subjective: Seen and examined at bedside.  Overnight event noted.  Patient had respiratory distress and rehab and moved to medical floor.  Started diuretics.  Currently on BiPAP.  Objective Vital signs in last 24 hours: Vitals:   06/14/2019 0533 06/22/2019 0600 06/14/2019 0630 06/22/2019 0648  BP: 105/80 107/77 93/74 110/86  Pulse: 90 79 (!) 119 (!) 127  Resp: (!) 33 (!) 29 (!) 24 (!) 26   Temp: 98.1 F (36.7 C)   97.7 F (36.5 C)  TempSrc: Axillary   Axillary  SpO2: 98% 98% 100% 100%   Weight change:   Intake/Output Summary (Last 24 hours) at 06/20/2019 0800 Last data filed at 06/13/2019 0630 Gross per 24 hour  Intake 16.2 ml  Output -  Net 16.2 ml       Labs: Basic Metabolic Panel: Recent Labs  Lab 06/04/19 0337 06/05/19 0349 06/07/2019 0501  NA 142 144 143  K 3.9 4.2 4.8  CL 110 115* 114*  CO2 21* 19* 18*  GLUCOSE 134* 140* 196*  BUN 53* 49* 47*  CREATININE 2.28* 2.26* 2.51*  CALCIUM 7.0* 6.8* 6.9*  PHOS 3.3  --   --    Liver Function Tests: Recent Labs  Lab 06/26/2019 0501  AST 47*  ALT 43  ALKPHOS 269*  BILITOT 1.3*  PROT 4.9*  ALBUMIN 2.5*   No results for input(s): LIPASE, AMYLASE in the last 168 hours. No results for input(s): AMMONIA in the last 168 hours. CBC: Recent Labs  Lab 06/02/19 0406 06/03/19 0016 06/04/19 0337 06/05/19 0349 06/18/2019 0501  WBC 7.8 8.0 8.1 8.0 13.4*  NEUTROABS  --   --   --   --  10.0*  HGB 10.2* 10.3* 10.6* 10.5* 11.7*  HCT 33.6* 33.5* 35.1* 34.6* 40.8  MCV 100.6* 99.7 100.9* 103.0* 107.1*  PLT 96* 89* 108* 127* 185   Cardiac Enzymes: No results for input(s): CKTOTAL, CKMB, CKMBINDEX, TROPONINI in the last 168 hours. CBG: Recent Labs  Lab 06/05/19 1146 06/05/19 1713 06/05/19 1728 06/05/19 1755 06/13/2019  0646  GLUCAP 108* 61* 53* 101* 135*    Iron Studies: No results for input(s): IRON, TIBC, TRANSFERRIN, FERRITIN in the last 72 hours. Studies/Results: Dg Chest Port 1 View  Result Date: 06/19/2019 CLINICAL DATA:  Respiratory distress. EXAM: PORTABLE CHEST 1 VIEW COMPARISON:  05/23/2019 FINDINGS: Sternotomy wires unchanged. Lungs are adequately inflated demonstrate slight interval worsening of diffuse bilateral mixed interstitial airspace density most prominent over the central lungs left greater than right. Findings may be due to asymmetric edema versus infection. Possible small amount left  pleural fluid. No evidence of pneumothorax. Mild cardiomegaly. Remainder of the exam is unchanged. IMPRESSION: Mild cardiomegaly with mild interval worsening of bilateral mixed interstitial airspace process left worse than right. Findings may be due to asymmetric edema versus infection. Possible small amount left pleural fluid. Electronically Signed   By: Marin Olp M.D.   On: 06/13/2019 04:04    Medications: Infusions: . sodium chloride    . diltiazem (CARDIZEM) infusion 5 mg/hr (06/24/2019 0630)    Scheduled Medications: . amiodarone  200 mg Oral BID  . apixaban  2.5 mg Oral BID  . calcitRIOL  0.25 mcg Oral Daily  . diltiazem  240 mg Oral Daily  . furosemide  40 mg Intravenous BID  . insulin aspart  0-5 Units Subcutaneous QHS  . insulin aspart  0-9 Units Subcutaneous TID WC  . insulin glargine  10 Units Subcutaneous QHS  . ipratropium-albuterol  3 mL Inhalation BID  . lactobacillus acidophilus  2 tablet Oral TID  . levETIRAcetam  500 mg Oral BID  . Melatonin  6 mg Oral QHS  . midodrine  5 mg Oral TID WC  . mometasone-formoterol  2 puff Inhalation BID  . pantoprazole  40 mg Oral Daily  . polyethylene glycol  17 g Oral Daily  . [START ON 06/17/2019] predniSONE  10 mg Oral Q breakfast  . [START ON 06/14/2019] predniSONE  20 mg Oral Q breakfast  . [START ON 06/11/2019] predniSONE  30 mg Oral Q breakfast  . [START ON 06/08/2019] predniSONE  40 mg Oral Q breakfast  . [START ON 06/20/2019] predniSONE  5 mg Oral Q breakfast  . predniSONE  50 mg Oral Q breakfast  . sodium bicarbonate  650 mg Oral BID  . sodium chloride flush  3 mL Intravenous Q12H    have reviewed scheduled and prn medications.  Physical Exam: General: On BiPAP, Heart:RRR, s1s2 nl Lungs: Bibasal crackle, no wheezing Abdomen:soft, Non-tender, non-distended Extremities: Lower extremity edema+ Dialysis Access: The HD catheter removed.   Prasad  06/02/2019,8:00 AM  LOS: 0 days  Pager: [phone]

## 2019-06-06 NOTE — Progress Notes (Signed)
PT Cancellation Note  Patient Details Name: Trigo Winterbottom MRN: [id number] DOB: 11-28-1950   Cancelled Treatment:    Reason Eval/Treat Not Completed: Medical issues which prohibited therapy. Pt transferred to CIR yesterday, 11/6. Developed respiratory distress overnight requiring re-admission to acute care. Pt currently on bipap. Attempts to wean to [place] failed. PT to follow and proceed with eval when medically ready.   Lorriane Shire 06/03/2019, 10:51 AM   Lorrin Goodell, PT  Office # [phone] Pager [phone]

## 2019-06-06 NOTE — Progress Notes (Signed)
Report given to Janett Billow, RN in [facility]. Patient transferred to [place] via bed with bibap on accompanied by RT, RN and Nurse tech. Cell phone, Ipad, 2 chargers and other valuables endorsed to RN.

## 2019-06-06 NOTE — Progress Notes (Signed)
[place] for Rivaroxaban  Indication: atrial fibrillation   Patient Measurements:   Heparin Dosing Weight: 86.7 kg  Vital Signs: Temp: 98.3 F (36.8 C) (11/07 1047) Temp Source: Axillary (11/07 1047) BP: 116/77 (11/07 1200) Pulse Rate: 106 (11/07 1201)  Labs: Recent Labs    06/04/19 0337 06/05/19 0349 06/13/2019 0501  HGB 10.6* 10.5* 11.7*  HCT 35.1* 34.6* 40.8  PLT 108* 127* 185  HEPARINUNFRC 1.10*  --   --   CREATININE 2.28* 2.26* 2.51*   Assessment: 68 y.o. male with Afib RVR and low EF start amiodarone drip. Previously on apixaban 2.5mg  BID - dose too low for this patient - plan to transition to rivaroxaban 15mg  daily (lower dose with CrCl about 40ml/min) with Cr 2.5 and wt 93kg H/h stable   Goal of Therapy:   Monitor platelets by anticoagulation protocol: Yes    Plan:  Rivaroxaban 15mg  Daily  Bonnita Nasuti Pharm.D. CPP, BCPS Clinical Pharmacist [phone] 06/23/2019 4:14 PM

## 2019-06-06 NOTE — Progress Notes (Signed)
Darlyne Russian, patient's wife informed of patient's transfer to [facility] room 6. Unit phone number given to wife.

## 2019-06-06 NOTE — Progress Notes (Signed)
All PO medications on hold until patient is off the bipap

## 2019-06-06 NOTE — Significant Event (Signed)
Pt initially complained of dyspnea after using bedpan at around 1am. Patient given time to rest and oxygen went up to 95% on 4L/[place]. Lung sounds was clear but diminished on expiration. Vital signs checked. At around 2:45 am, he complained of increasing shortness of breath with increasing work of breathing. Dr. Letta Pate informed. RT gave xopenex neb but no relief. Lung sounds this time was coarse with crackles bilaterally. Vital signs taken with MEWS of 2 and increased to 4. RT contacted Dr. Letta Pate with orders.Rapid response RN (Mindy) and charge RN Manuela Schwartz) informed. Chest xray done. Dr. Letta Pate called of patient's status and gave order for 40 mg of Lasix. Patient not getting better and became more anxious. Rapid RN  at bedside. Pt very anxious and yelling he is dying. Rapid RN ordered RT for bipap machine. Multiple attempts for IV insertion by flood RNs and IV team. Dr. Myna Hidalgo at bedside At around 4:30 am and checked patient with order with transfer patient to acute.

## 2019-06-06 NOTE — Significant Event (Signed)
Rapid Response Event Note  Overview: RRT was notified around 0330 by respiratory that pt was in mild respiratory distress and that MD was getting chest xray. RN called about 0354 to let RRT know that lasix was ordered. RN called back at 0403 to ask RRT to come to bedside because distress had worsened. Time Called: 0354 Arrival Time: 0404 Event Type: Respiratory  Initial Focused Assessment: Pt laying in bed, anxious, in respiratory distress, breath sounds congested. Pt alert and oriented saying he can't breath. Lungs crackles t/o. HR-140s, RR-45, SpO2-86% on 4L [place].  NRB attempted, however, pt did not tolerate it. PCXR-Mild cardiomegaly with mild interval worsening of bilateral mixed interstitial airspace process left worse than right. Findings may be due to asymmetric edema versus infection. Possible small amount left pleural fluid. Lasix already ordered but RN having difficulty getting PIV. IV team paged for assistance. Decision made to place pt on bipap at least until able to give lasix. SpO2-91% on .60 bipap and pt WOB better than previously.   Interventions: IV team to bedside-2 PIVs placed Bipap Tx to 2C06 Plan of Care (if not transferred): Bipap, give lasix, continue to monitor respiratory status. Call RRT if further assistance needed. Event Summary: Name of Physician Notified: Read Drivers, MD at (PTA RRT)    at    Outcome: Transferred (Comment)(2C06)     Dillard Essex

## 2019-06-07 ENCOUNTER — Inpatient Hospital Stay (HOSPITAL_COMMUNITY): Payer: Medicare Other

## 2019-06-07 ENCOUNTER — Inpatient Hospital Stay (HOSPITAL_COMMUNITY): Payer: Federal, State, Local not specified - PPO

## 2019-06-07 LAB — BASIC METABOLIC PANEL
Anion gap: 17 — ABNORMAL HIGH (ref 5–15)
BUN: 51 mg/dL — ABNORMAL HIGH (ref 8–23)
CO2: 18 mmol/L — ABNORMAL LOW (ref 22–32)
Calcium: 6.3 mg/dL — CL (ref 8.9–10.3)
Chloride: 106 mmol/L (ref 98–111)
Creatinine, Ser: 2.36 mg/dL — ABNORMAL HIGH (ref 0.61–1.24)
GFR calc Af Amer: 32 mL/min — ABNORMAL LOW (ref >60)
GFR calc non Af Amer: 28 mL/min — ABNORMAL LOW (ref >60)
Glucose, Bld: 365 mg/dL — ABNORMAL HIGH (ref 70–99)
Potassium: 4.4 mmol/L (ref 3.5–5.1)
Sodium: 141 mmol/L (ref 135–145)

## 2019-06-07 LAB — BLOOD GAS, ARTERIAL
Acid-base deficit: 2.9 mmol/L — ABNORMAL HIGH (ref 0.0–2.0)
Bicarbonate: 21 mmol/L (ref 20.0–28.0)
Drawn by: 44898
FIO2: 45
O2 Saturation: 94 %
Patient temperature: 36.3
pCO2 arterial: 32.7 mmHg (ref 32.0–48.0)
pH, Arterial: 7.42 (ref 7.350–7.450)
pO2, Arterial: 68.9 mmHg — ABNORMAL LOW (ref 83.0–108.0)

## 2019-06-07 LAB — GRAM STAIN

## 2019-06-07 LAB — CBC WITH DIFFERENTIAL/PLATELET
Abs Immature Granulocytes: 0.06 10*3/uL (ref 0.00–0.07)
Basophils Absolute: 0 10*3/uL (ref 0.0–0.1)
Basophils Relative: 0 %
Eosinophils Absolute: 0.2 10*3/uL (ref 0.0–0.5)
Eosinophils Relative: 2 %
HCT: 37.2 % — ABNORMAL LOW (ref 39.0–52.0)
Hemoglobin: 11.4 g/dL — ABNORMAL LOW (ref 13.0–17.0)
Immature Granulocytes: 1 %
Lymphocytes Relative: 12 %
Lymphs Abs: 1.1 10*3/uL (ref 0.7–4.0)
MCH: 31.8 pg (ref 26.0–34.0)
MCHC: 30.6 g/dL (ref 30.0–36.0)
MCV: 103.6 fL — ABNORMAL HIGH (ref 80.0–100.0)
Monocytes Absolute: 0.5 10*3/uL (ref 0.1–1.0)
Monocytes Relative: 5 %
Neutro Abs: 7.5 10*3/uL (ref 1.7–7.7)
Neutrophils Relative %: 80 %
Platelets: 157 10*3/uL (ref 150–400)
RBC: 3.59 MIL/uL — ABNORMAL LOW (ref 4.22–5.81)
RDW: 19.8 % — ABNORMAL HIGH (ref 11.5–15.5)
WBC: 9.3 10*3/uL (ref 4.0–10.5)
nRBC: 0 % (ref 0.0–0.2)

## 2019-06-07 LAB — GLUCOSE, CAPILLARY
Glucose-Capillary: 106 mg/dL — ABNORMAL HIGH (ref 70–99)
Glucose-Capillary: 61 mg/dL — ABNORMAL LOW (ref 70–99)
Glucose-Capillary: 99 mg/dL (ref 70–99)
Glucose-Capillary: 94 mg/dL (ref 70–99)
Glucose-Capillary: 96 mg/dL (ref 70–99)
Glucose-Capillary: 129 mg/dL — ABNORMAL HIGH (ref 70–99)
Glucose-Capillary: 163 mg/dL — ABNORMAL HIGH (ref 70–99)

## 2019-06-07 LAB — BODY FLUID CELL COUNT WITH DIFFERENTIAL
Lymphs, Fluid: 21 %
Monocyte-Macrophage-Serous Fluid: 56 % (ref 50–90)
Neutrophil Count, Fluid: 23 % (ref 0–25)
Total Nucleated Cell Count, Fluid: 900 cu mm (ref 0–1000)

## 2019-06-07 LAB — LACTATE DEHYDROGENASE, PLEURAL OR PERITONEAL FLUID: LD, Fluid: 84 U/L — ABNORMAL HIGH (ref 3–23)

## 2019-06-07 LAB — PROTEIN, PLEURAL OR PERITONEAL FLUID: Total protein, fluid: <3 g/dL

## 2019-06-07 LAB — ALBUMIN, PLEURAL OR PERITONEAL FLUID: Albumin, Fluid: <1 g/dL

## 2019-06-07 LAB — PROCALCITONIN: Procalcitonin: 2.1 ng/mL

## 2019-06-07 IMAGING — DX DG CHEST 1V
1 series · 1 of 1 positions shown · non-contrast
Comparison: [DATE]

CLINICAL DATA: Pleural effusion, status post thoracentesis

EXAM:
CHEST  1 VIEW

[chest]
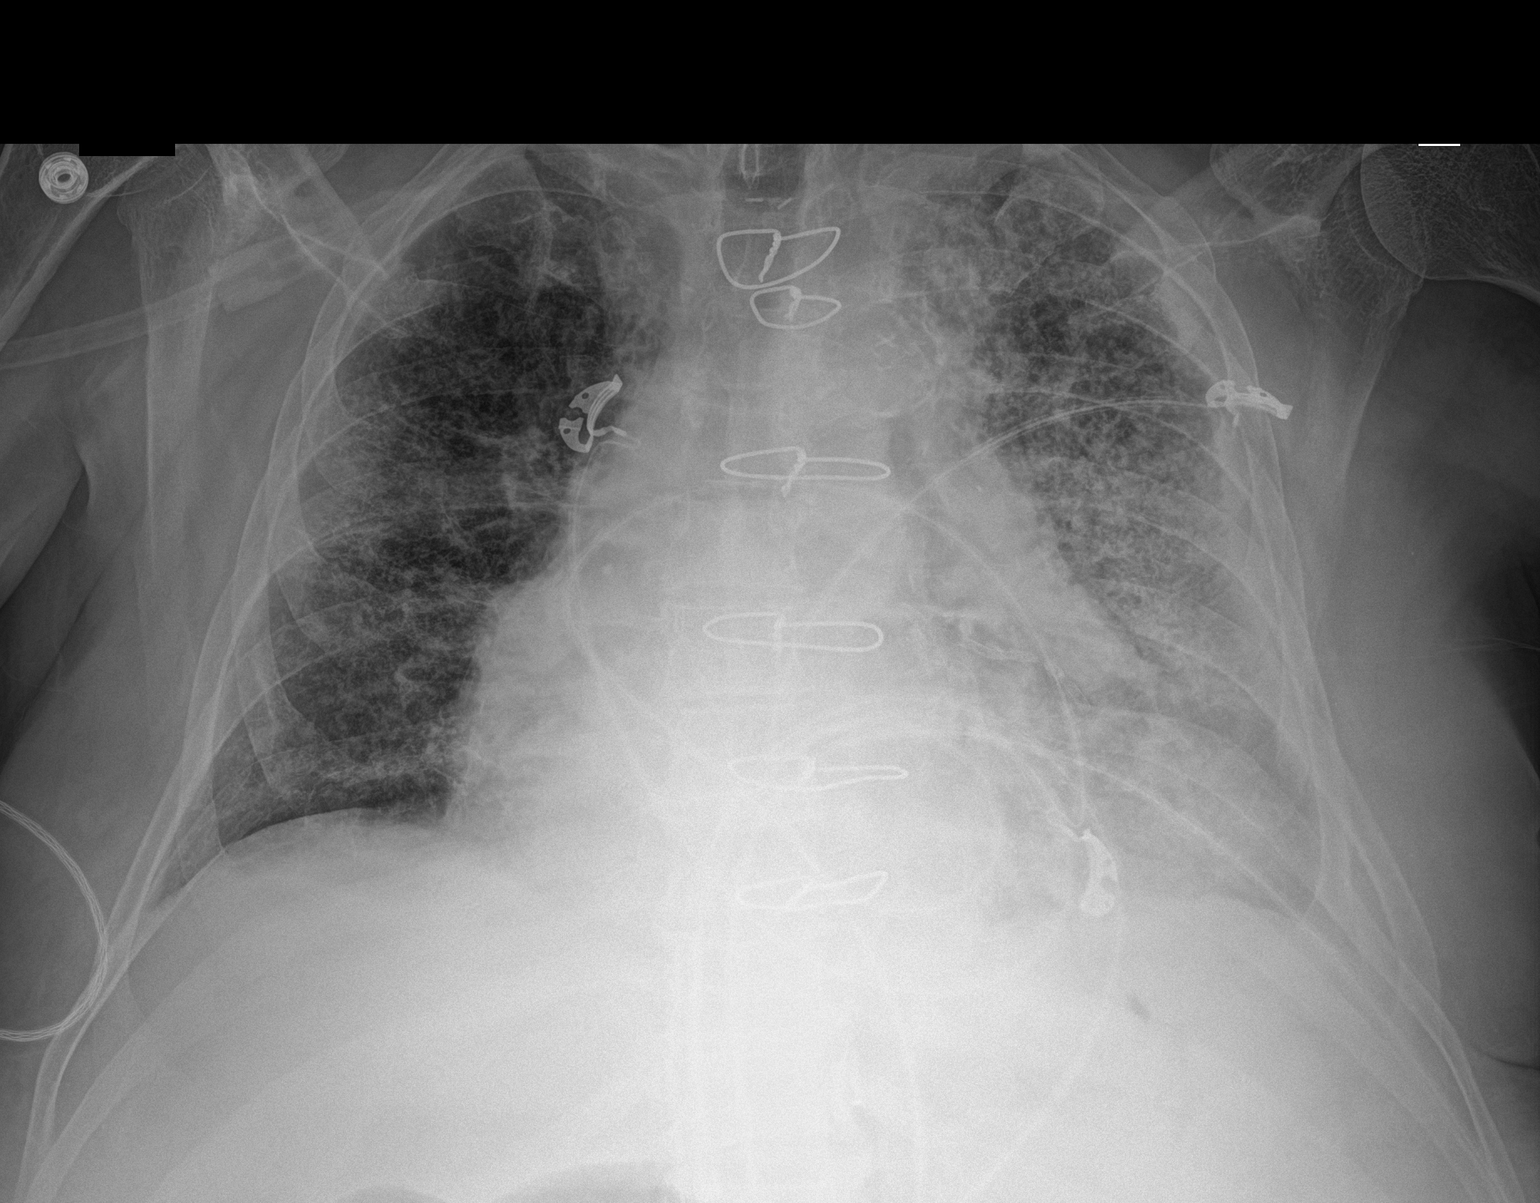

[1 of 1 positions shown; findings below may reference images not displayed]

FINDINGS: No significant change in appearance of the chest status post
thoracentesis with diffuse bilateral interstitial airspace opacity,
left pleural thickening and loculated pleural fluid, and a layering
right pleural effusion not appreciably changed compared to prior
radiographic appearance. No pneumothorax. No new airspace opacity.
Cardiomegaly status post median sternotomy.
IMPRESSION: 1. No significant change in appearance of the chest status post
thoracentesis with diffuse bilateral interstitial airspace opacity,
left pleural thickening and loculated pleural fluid, and a layering
right pleural effusion not appreciably changed compared to prior
radiographic appearance.

2.  No pneumothorax.

3.  No new airspace opacity.

## 2019-06-07 IMAGING — US US THORACENTESIS ASP PLEURAL SPACE W/IMG GUIDE
1 series · 4 of 4 positions shown · non-contrast
Comparison: none

INDICATION: Acute on chronic congestive heart failure. Right pleural effusion.
Request for diagnostic therapeutic thoracentesis.

[Series 1: us thoracentesis asp pleural space w/img guide · 4 of 4 slices shown]
[im 1/4]
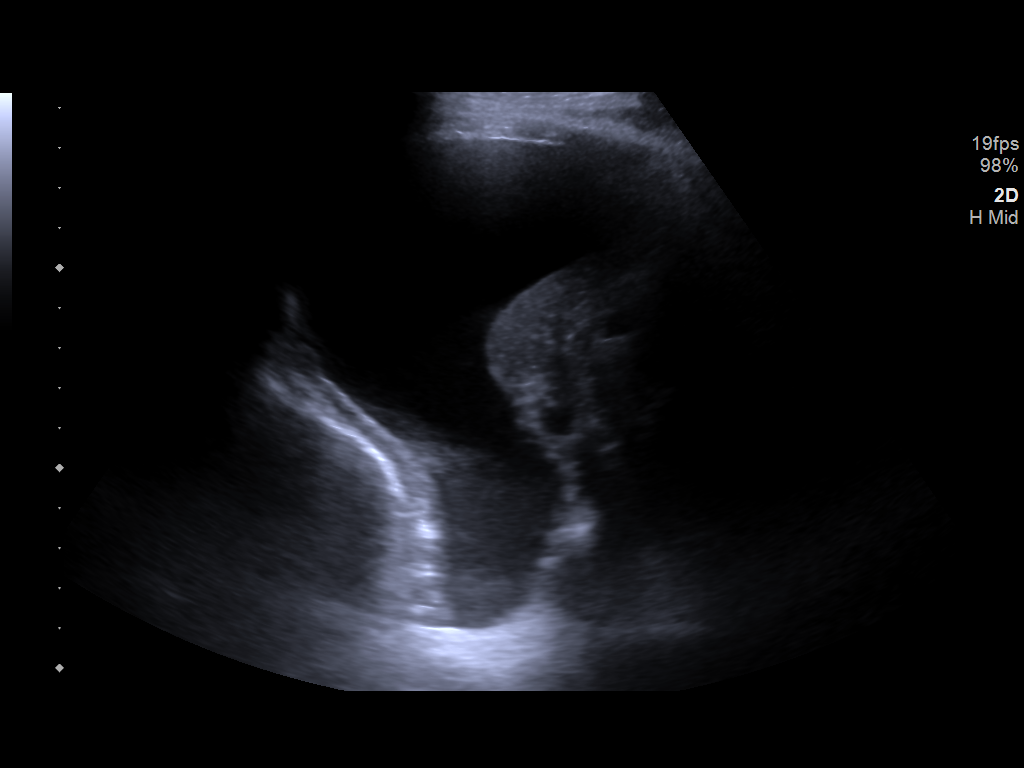
[im 2/4]
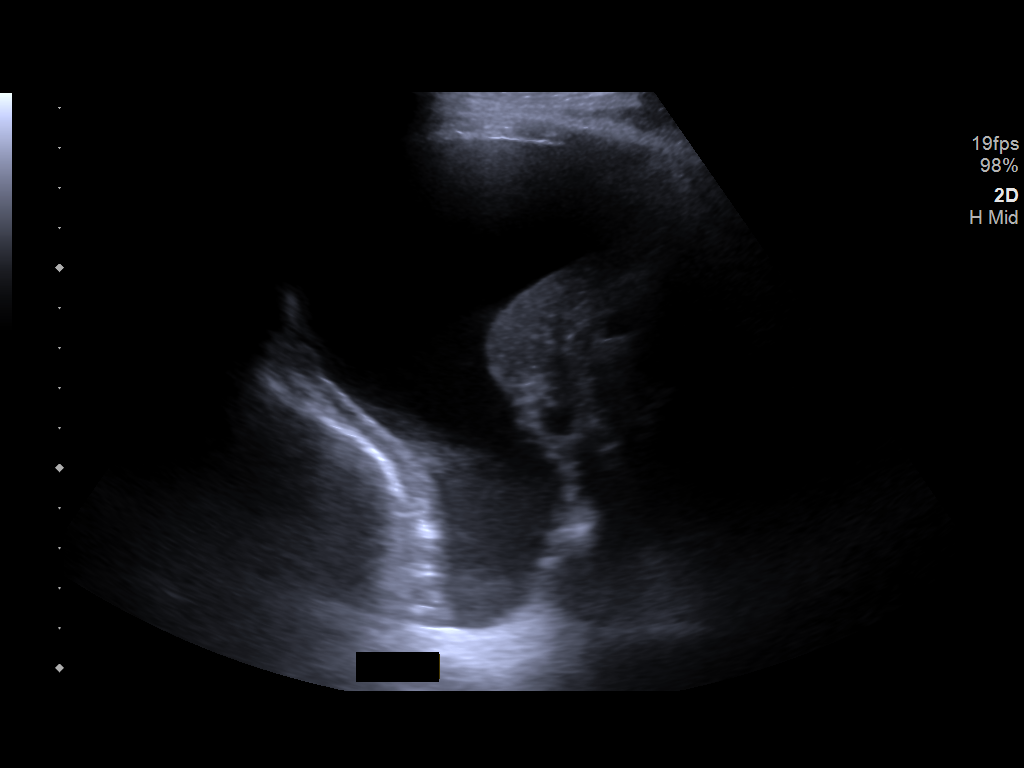
[im 3/4]
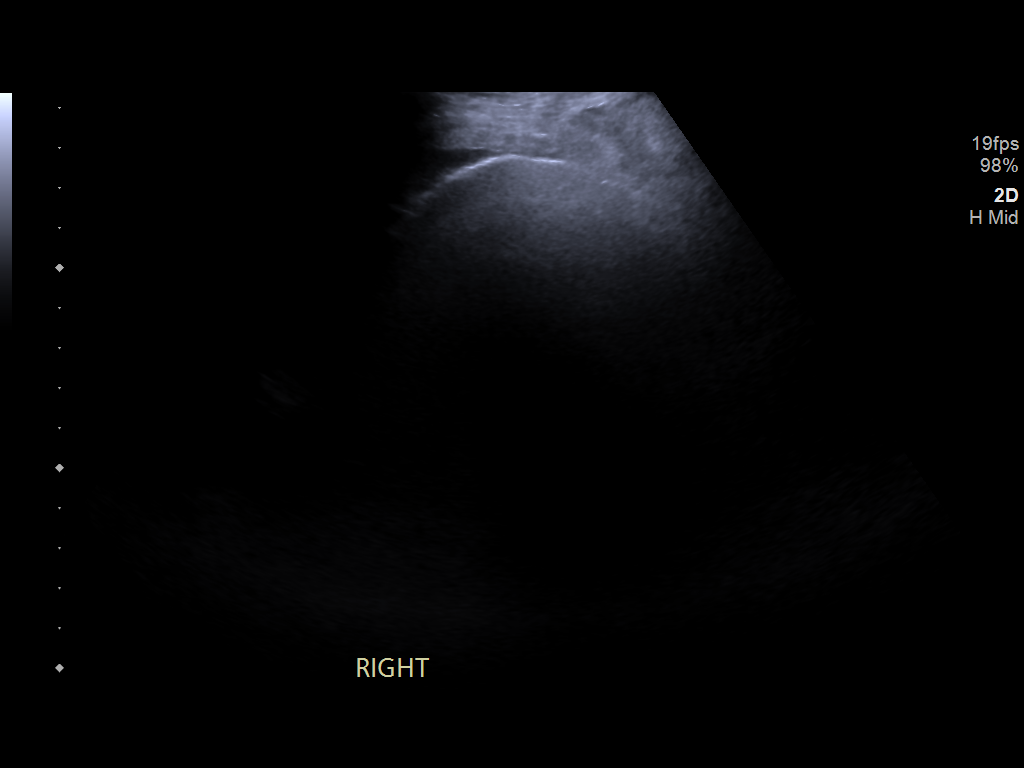
[im 4/4]
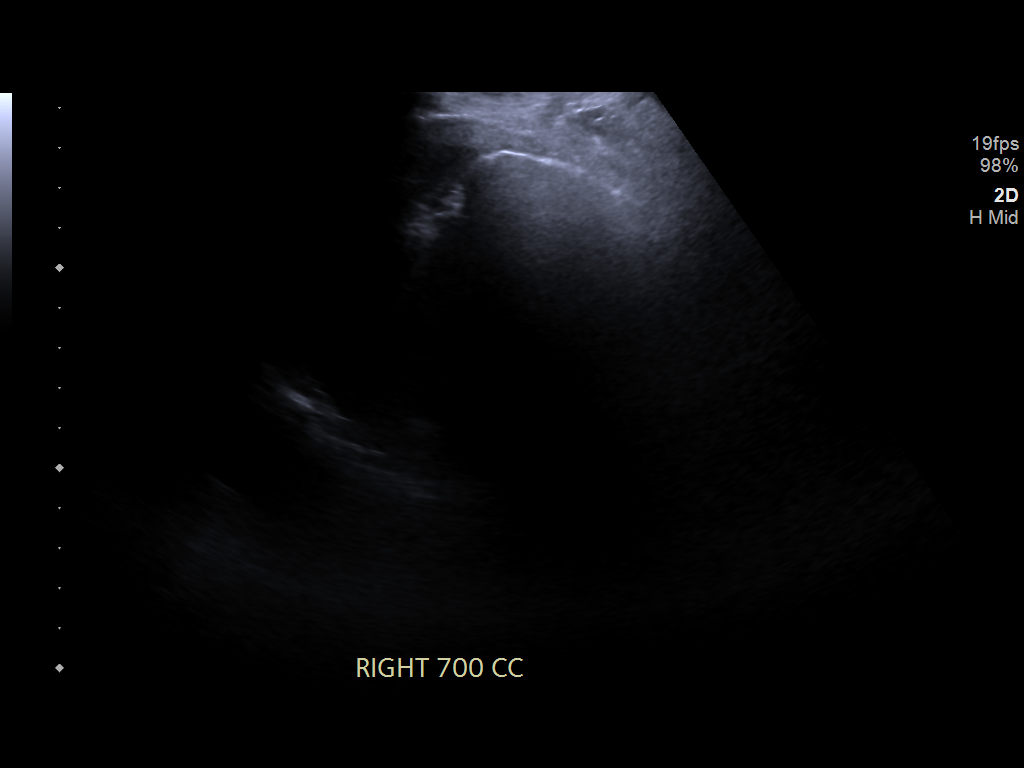

[4 of 4 positions shown; findings below may reference images not displayed]

EXAM:
ULTRASOUND GUIDED RIGHT THORACENTESIS

MEDICATIONS:
1% lidocaine 10 mL

COMPLICATIONS:
None immediate.

PROCEDURE:
An ultrasound guided thoracentesis was thoroughly discussed with the
patient and questions answered. The benefits, risks, alternatives
and complications were also discussed. The patient understands and
wishes to proceed with the procedure. Written consent was obtained.

Ultrasound was performed to localize and mark an adequate pocket of
fluid in the right chest. The area was then prepped and draped in
the normal sterile fashion. 1% Lidocaine was used for local
anesthesia. Under ultrasound guidance a 6 Fr Safe-T-Centesis
catheter was introduced. Thoracentesis was performed. The catheter
was removed and a dressing applied.
FINDINGS: A total of approximately 700 mL of clear yellow fluid was removed.
Samples were sent to the laboratory as requested by the clinical
team.
IMPRESSION: Successful ultrasound guided right thoracentesis yielding 700 mL of
pleural fluid.

No pneumothorax on post-procedure chest x-ray.

## 2019-06-07 IMAGING — CT CT CHEST W/O CM
2 of 4 series · 14 of 36 positions shown, 17 images · non-contrast
Comparison: [DATE] and [DATE] and CT abdomen [DATE]

CLINICAL DATA: Shortness of breath. Pulmonary edema suspected.
Hospitalized for [W1] pneumonia [DATE].

EXAM:
CT CHEST WITHOUT CONTRAST
TECHNIQUE: Multidetector CT imaging of the chest was performed following the
standard protocol without IV contrast.

[Series 3: chest wo · axial · 0.76mm/px · z∈[-287,-23]mm · 11 of 156 slices shown, 14 images]
[im 12/156  mediastinal]
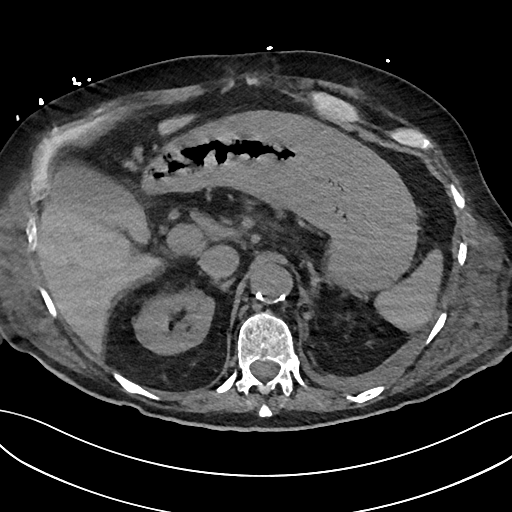
[im 12/156  lung]
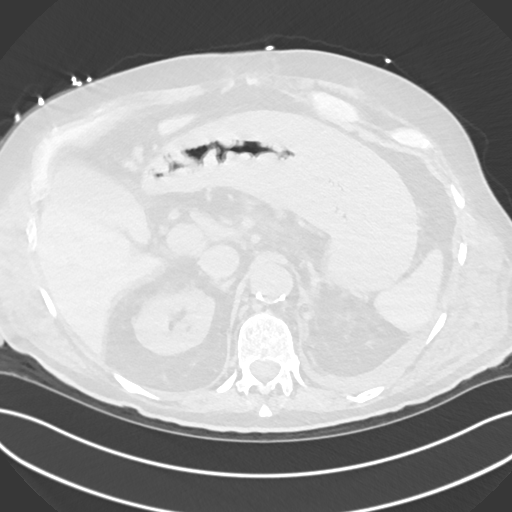
[im 24/156  lung]
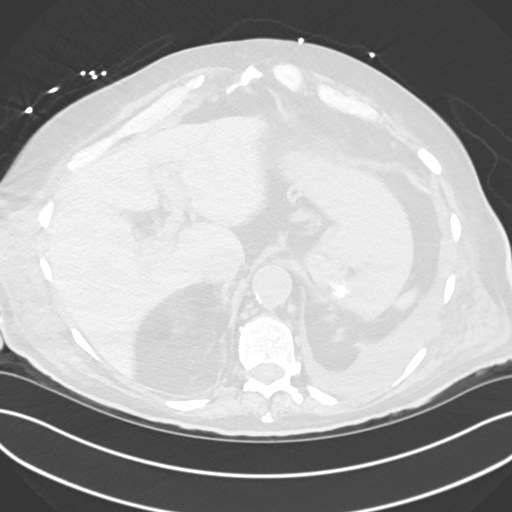
[im 36/156  lung]
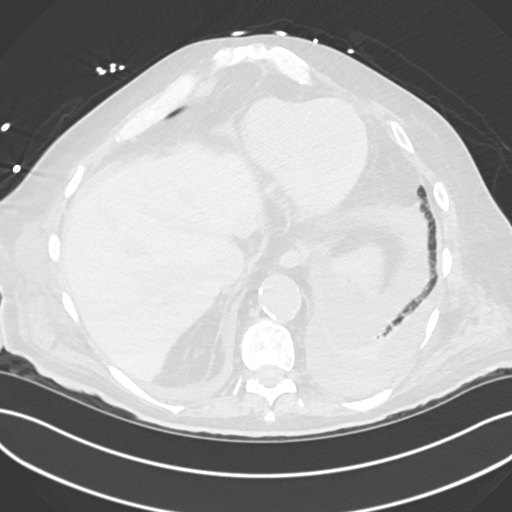
[im 48/156  lung]
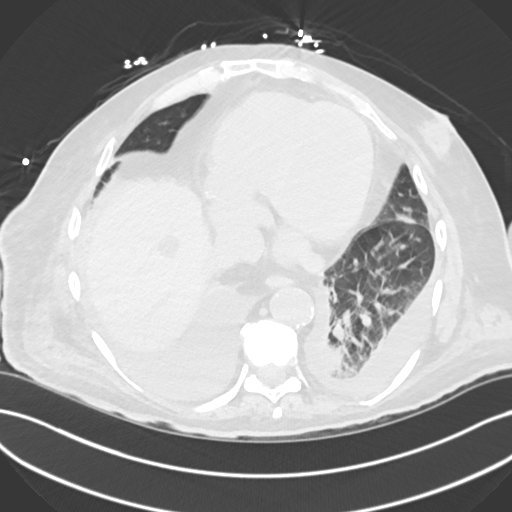
[im 60/156  mediastinal]
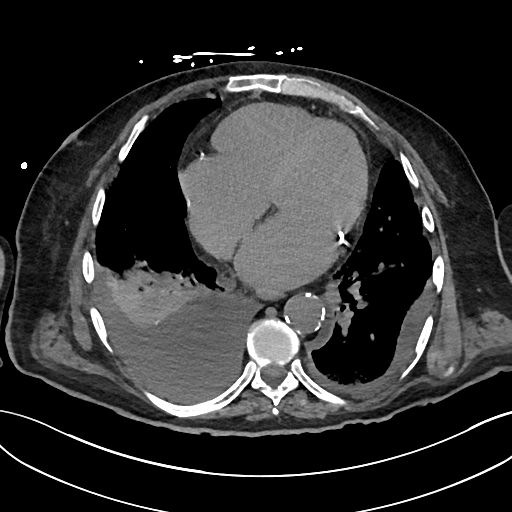
[im 60/156  lung]
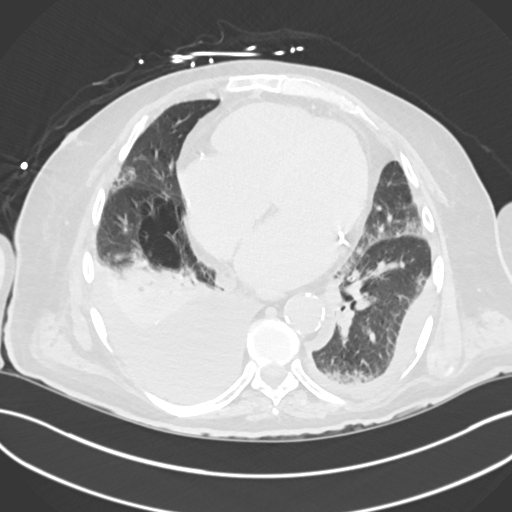
[im 84/156  lung]
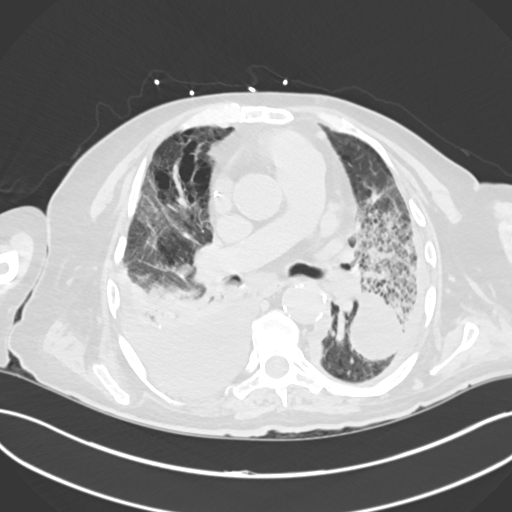
[im 96/156  lung]
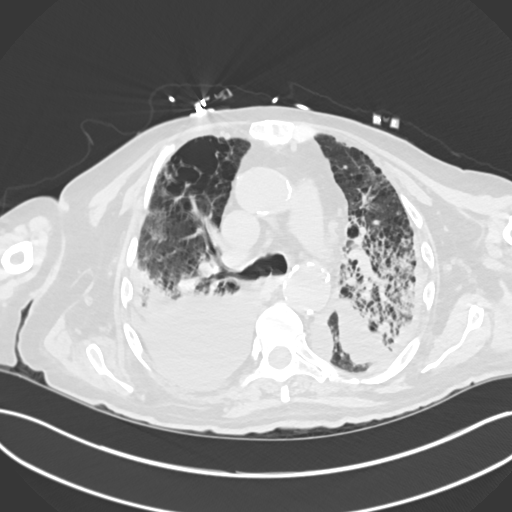
[im 108/156  lung]
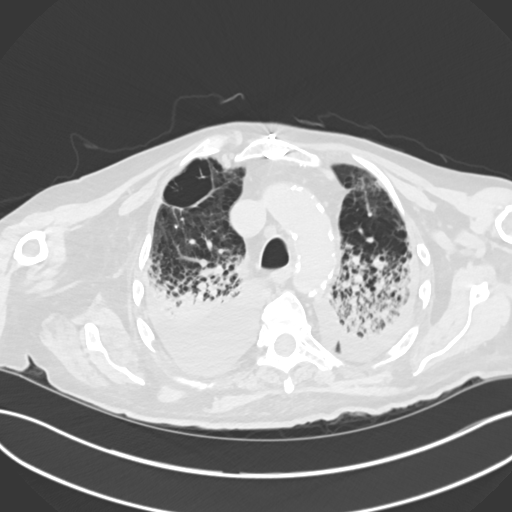
[im 120/156  mediastinal]
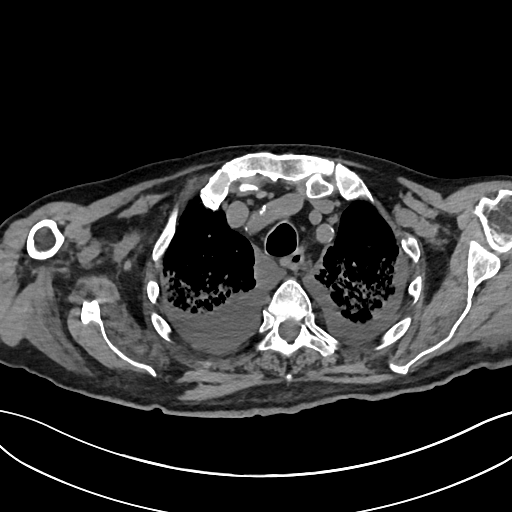
[im 120/156  lung]
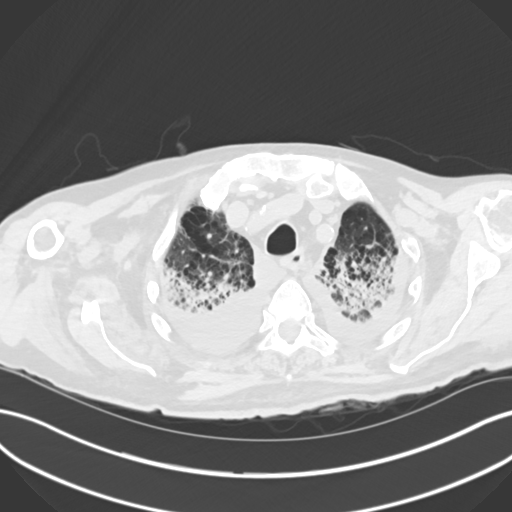
[im 132/156  lung]
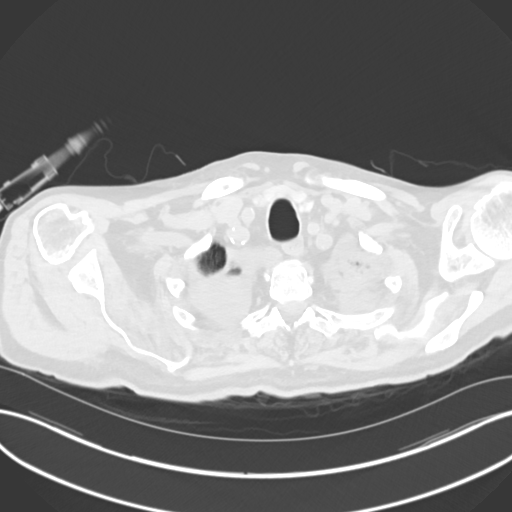
[im 144/156  lung]
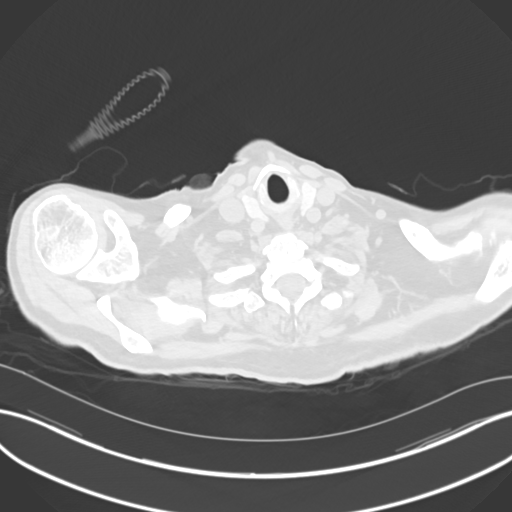

[Series 6: cor · coronal · 0.61mm/px · 3 of 154 slices shown]
[im 31/154  lung]
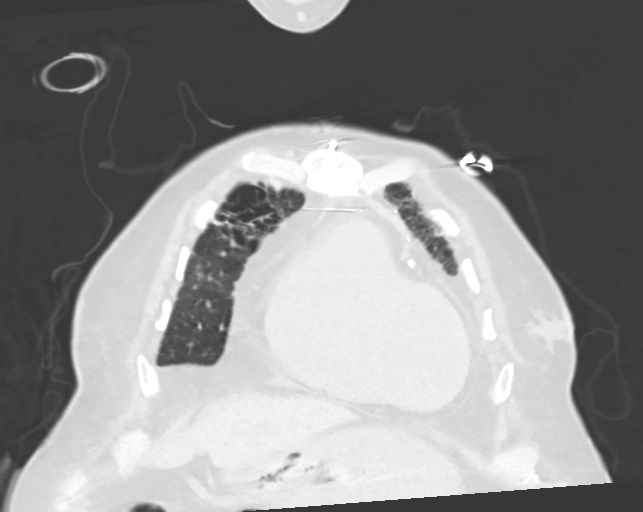
[im 62/154  lung]
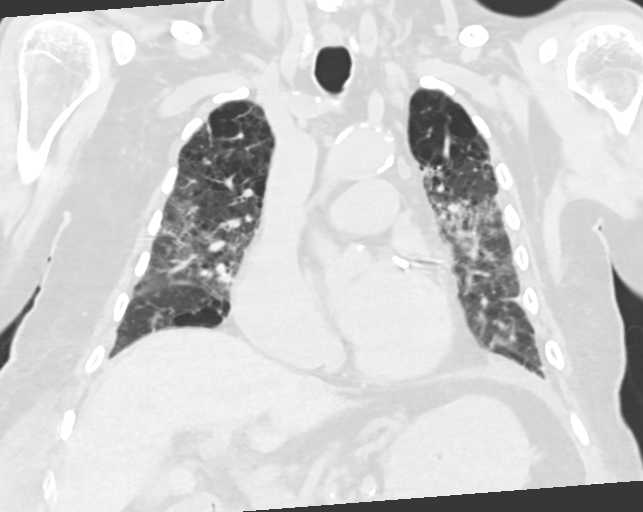
[im 92/154  lung]
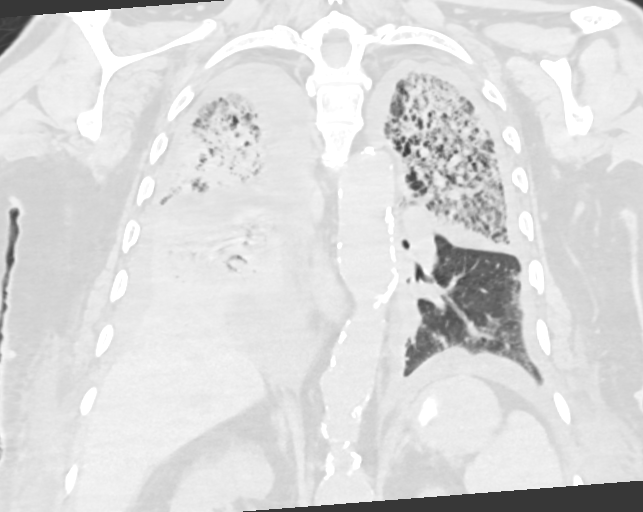

[14 of 36 positions shown; findings below may reference images not displayed]

FINDINGS: Cardiovascular: Mild cardiomegaly. Evidence of previous median
sternotomy. Calcified plaque over the coronary arteries unchanged.
Calcified plaque over the thoracic aorta. Thoracic aorta is
otherwise normal in caliber. Remaining vascular structures are
unremarkable.

Mediastinum/Nodes: Stable 1.3 cm subcarinal lymph node likely
reactive. No other significant mediastinal lymph nodes. No
significant hilar adenopathy on this noncontrast study. Slight
increase in size of a 9 mm lymph node adjacent the distal esophagus.
Remaining mediastinal structures are within normal.

Lungs/Pleura: Lungs are adequately inflated demonstrate evidence of
moderate centrilobular and paraseptal emphysema. Persistent moderate
bilateral mixed interstitial airspace density most prominent over
the upper lobes. There is mild improvement over the right middle
lobe as this process otherwise unchanged. Stable small left pleural
effusion with loculated component over the superior aspect of the
major fissure. Moderate worsening of moderate size right pleural
effusion. Airways are unremarkable.

Upper Abdomen: Stable hypodensity over the dome of the liver likely
a cyst. Calcified plaque over the abdominal aorta. Abdominal aorta
measures 3.4 cm in AP diameter at the level of the takeoff of the
celiac axis unchanged from previous.

Musculoskeletal: Mild degenerative change of the spine.
IMPRESSION: 1. Persistent mixed interstitial airspace process bilaterally most
prominent over the upper lobes with interval improvement in airspace
density over the right middle lobe. Findings are likely due to
ongoing infection. Stable small left effusion and worsening moderate
size right pleural effusion.

2. Aortic Atherosclerosis ([W1]-[W1]) and Emphysema ([W1]-[W1]).

3. Aortic aneurysm NOS ([W1]-[W1]). 3.4 cm aneurysm of the
proximal abdominal aorta at the level of the celiac axis unchanged
and better evaluated on patient's recent abdominal CT [DATE].

[DATE].  Cardiomegaly and atherosclerotic coronary artery disease.

5.  Stable 1.5 cm liver hypodensity likely a cyst.

## 2019-06-07 MED ORDER — SODIUM CHLORIDE 0.9 % IV SOLN
2.0000 g | Freq: Two times a day (BID) | INTRAVENOUS | Status: DC
  Administered 2019-06-07 (×2): 2 g via INTRAVENOUS
  Filled 2019-06-07 (×4): qty 2

## 2019-06-07 MED ORDER — INSULIN GLARGINE 100 UNIT/ML ~~LOC~~ SOLN
25.0000 [IU] | Freq: Every day | SUBCUTANEOUS | Status: DC
  Administered 2019-06-07 – 2019-06-10 (×4): 25 [IU] via SUBCUTANEOUS
  Filled 2019-06-07 (×5): qty 0.25

## 2019-06-07 MED ORDER — LORAZEPAM 2 MG/ML IJ SOLN
0.5000 mg | Freq: Once | INTRAMUSCULAR | Status: AC
  Administered 2019-06-07: 0.5 mg via INTRAVENOUS
  Filled 2019-06-07: qty 1

## 2019-06-07 MED ORDER — VANCOMYCIN HCL IN DEXTROSE 1-5 GM/200ML-% IV SOLN
1000.0000 mg | INTRAVENOUS | Status: DC
  Administered 2019-06-08 – 2019-06-11 (×4): 1000 mg via INTRAVENOUS
  Filled 2019-06-07 (×5): qty 200

## 2019-06-07 MED ORDER — DEXTROSE 50 % IV SOLN
25.0000 mL | Freq: Once | INTRAVENOUS | Status: AC
  Administered 2019-06-07: 25 mL via INTRAVENOUS

## 2019-06-07 MED ORDER — LIDOCAINE HCL (PF) 1 % IJ SOLN
INTRAMUSCULAR | Status: AC
  Filled 2019-06-07: qty 30

## 2019-06-07 MED ORDER — METOLAZONE 5 MG PO TABS
5.0000 mg | ORAL_TABLET | Freq: Once | ORAL | Status: AC
  Administered 2019-06-07: 5 mg via ORAL
  Filled 2019-06-07: qty 1

## 2019-06-07 MED ORDER — VANCOMYCIN HCL 10 G IV SOLR
1500.0000 mg | Freq: Once | INTRAVENOUS | Status: AC
  Administered 2019-06-07: 1500 mg via INTRAVENOUS
  Filled 2019-06-07: qty 1500

## 2019-06-07 MED ORDER — CALCIUM GLUCONATE-NACL 1-0.675 GM/50ML-% IV SOLN
1.0000 g | Freq: Once | INTRAVENOUS | Status: AC
  Administered 2019-06-07: 10:00:00 1000 mg via INTRAVENOUS
  Filled 2019-06-07: qty 50

## 2019-06-07 MED ORDER — FUROSEMIDE 10 MG/ML IJ SOLN
80.0000 mg | Freq: Three times a day (TID) | INTRAMUSCULAR | Status: DC
  Administered 2019-06-07 – 2019-06-13 (×18): 80 mg via INTRAVENOUS
  Filled 2019-06-07 (×18): qty 8

## 2019-06-07 MED ORDER — CALCIUM GLUCONATE-NACL 1-0.675 GM/50ML-% IV SOLN
1.0000 g | Freq: Once | INTRAVENOUS | Status: AC
  Administered 2019-06-07: 1000 mg via INTRAVENOUS
  Filled 2019-06-07: qty 50

## 2019-06-07 MED ORDER — SODIUM CHLORIDE 0.9% FLUSH
10.0000 mL | INTRAVENOUS | Status: DC | PRN
  Administered 2019-06-11: 10 mL
  Administered 2019-06-13 (×2): 20 mL
  Filled 2019-06-07 (×3): qty 40

## 2019-06-07 MED ORDER — SODIUM CHLORIDE 0.9% FLUSH
10.0000 mL | Freq: Two times a day (BID) | INTRAVENOUS | Status: DC
  Administered 2019-06-07 – 2019-06-13 (×9): 10 mL
  Administered 2019-06-14: 20 mL
  Administered 2019-06-14 – 2019-06-15 (×2): 10 mL

## 2019-06-07 NOTE — Progress Notes (Signed)
Pharmacy Antibiotic Note  Dominic Mahaney is a 68 y.o. male admitted on 06/21/2019 CIR with respiratory distress.  WBC wnl, afebrile but Cxr shows pna, and pct in process, Cr 2.3 Crcl 19ml/min.   Pharmacy has been consulted for vancomycin and cefepime dosing for HCAP.  Plan: Vancomycin 1500mg  x1 then 1gm IV q24h est AUC 471 Cefepime 2gm iIV q12h  Height: 5\' 11"  (180.3 cm) Weight: 198 lb (89.8 kg) IBW/kg (Calculated) : 75.3  Temp (24hrs), Avg:97.7 F (36.5 C), Min:97.3 F (36.3 C), Max:98.7 F (37.1 C)  Recent Labs  Lab 06/03/19 0016 06/04/19 0337 06/05/19 0349 06/17/2019 0501 06/08/2019 0930 06/07/19 0253  WBC 8.0 8.1 8.0 13.4*  --  9.3  CREATININE 2.42* 2.28* 2.26* 2.51*  --  2.36*  LATICACIDVEN  --   --   --  2.9* 1.8  --     Estimated Creatinine Clearance: 33.2 mL/min (A) (by C-G formula based on SCr of 2.36 mg/dL (H)).    No Known Allergies  Antimicrobials this admission:   Dose adjustments this admission:   Microbiology results:   Bonnita Nasuti Pharm.D. CPP, BCPS Clinical Pharmacist [phone] 06/07/2019 11:13 AM

## 2019-06-07 NOTE — Progress Notes (Signed)
CRITICAL VALUE ALERT  Critical Value:  Calcium 6.3  Date & Time Notied:  06/07/2019 at 0401  Provider Notified: [place]  Orders Received/Actions taken: Orders for IV calcium gluconate received.  See MAR for administration.

## 2019-06-07 NOTE — Progress Notes (Signed)
PROGRESS NOTE    Dylan Goodman  WJX:[id number] DOB: 26-Jun-1951 DOA: 06/14/2019 PCP: Steele Sizer, MD   Brief Narrative:  Patient is a 68 year old male with history of  atrial fibrillation, GI bleed, coronary artery disease status post CABG, nasopharyngeal cancer, cardiomyopathy, chronic depression, CKD stage 4, diabetes mellitus, GI bleed , peripheral vascular disease, previous smoker, recent Covid-19 infection who was sent from inpatient rehab for the evaluation of acute respiratory distress.  He was just discharged from here to inpatient rehab yesterday.  He developed respiratory distress overnight and had to be kept on BiPAP.  He was admitted for the management of acute on chronic CHF exacerbation.he was also noted to have peripheral edema, diffuse Rales.  Chest x-ray concerning for pulmonary edema.  Patient has also been seen by nephrology.  Started on Lasix 80 mg IV twice a day . He continues to require BiPAP for maintenance of saturation today.  CT chest without contrast done today showed possible multifocal pneumonia so he has been started on antibiotics.  He is also being followed by cardiology for new finding of worsening systolic heart failure and A. fib with RVR.Plan for cardioversion tomorrow.   Assessment & Plan:   Principal Problem:   Respiratory failure, acute ([place]) Active Problems:   CKD (chronic kidney disease), stage IV (HCC)   Coronary artery disease involving coronary bypass graft of native heart   IDA (iron deficiency anemia)   Type 2 diabetes mellitus with stage 4 chronic kidney disease, with long-term current use of insulin (HCC)   Metabolic acidosis   Atrial fibrillation with RVR (HCC)   Seizure (HCC)   Acute on chronic systolic (congestive) heart failure (HCC)   Acute on chronic respiratory failure: Secondary to multifocal pneumonia, pulmonary edema. CT chest showed :Persistent mixed interstitial airspace process bilaterally most prominent over the  upper lobes with interval improvement in airspace density over the right middle lobe. Findings are concerning for  ongoing infection. Also showed table small left effusion and worsening moderate size right pleural effusion. I will treat this as HCAP and will start on vancomycin and cefepime.  We will also request for US guided thoracentesis of the right sided moderate pleural effusion.  Continue Lasix.Currently on bipap.ABG on 06/07/2019 showed normal CO2 but decreased oxygenation. He was intubated for airway protection on last admission when he presented with seizures.  Acute on chronic systolic congestive heart failure: Echo done on this admission showed ejection fraction to 20-25%.  Diffuse hypokinesis, severely reduced left ventricular function. echocardiogram done on 05/08/2019 had shown ejection fraction of 40- 45% . Cardiology following.  Continue diuresis  A. fib with RVR: Hospital course remarkable for Afib with RVR  .  Cardiology following.  On Eliquis at home for anticoagulation .  Anticoagulation changed to Xarelto.  Cardiology planning for DCCV.   Heart rate was controlled this morning.  On amiodarone drip.  Also on rate control with metoprolol.  AKI on CKD stage 4:  Baseline creatinine around 2.5-3.  Nephrology following.  Continue diuresis  Seizure:  Continue Keppra.  Follow-up with neurology as an outpatient.  Insulin-dependent diabetic mellitus: Hemoglobin A1c of 6.7 in March 10, 2019.  On insulin at home.  Continue current insulin regimen.  Chronic hypotension: Suspected to be from adrenal insufficiency.  Currently on midodrine.Also on tapering steroids.  Debility/deconditioning: Was just discharged to rehab.  PT/OT following        DVT prophylaxis: Xarelto Code Status: Full Family Communication: Discussed with wife on phone on 06/07/19  Disposition Plan: Undetermined at this point.  Back to back to CIR when medically stable   Consultants: Cardiology,  nephrology  Procedures: None  Antimicrobials:  Anti-infectives (From admission, onward)   None      Subjective: Patient seen and examined the bedside this morning.  Currently hemodynamically stable.  Heart rate well controlled this morning.  He rapidly desaturated on even high flow oxygen and had to be  put on BiPAP today.  He said he had a rough day today.  Looks dyspneic.  CT chest showed possible pneumonia  Objective: Vitals:   06/07/19 0500 06/07/19 0719 06/07/19 0826 06/07/19 0958  BP:  111/77 108/70 105/80  Pulse: 78 86  73  Resp: (!) 23 (!) 25  (!) 22  Temp:  (!) 97.4 F (36.3 C)    TempSrc:  Axillary    SpO2: 98% 95%  96%  Weight:      Height:        Intake/Output Summary (Last 24 hours) at 06/07/2019 1022 Last data filed at 06/07/2019 0900 Gross per 24 hour  Intake 10 ml  Output 2375 ml  Net -2365 ml   Filed Weights   06/07/19 0300  Weight: 89.8 kg    Examination:  General exam: Mild to moderate respiratory distress, deconditioned, debilitated HEENT:PERRL,Oral mucosa moist, Ear/Nose normal on gross exam Respiratory system: Bilateral decreased air entry, bilateral basal crackles Cardiovascular system: A. fib. No JVD, murmurs, rubs, gallops or clicks.  Trace pedal edema. Gastrointestinal system: Abdomen is nondistended, soft and nontender. No organomegaly or masses felt. Normal bowel sounds heard. Central nervous system: Alert and oriented. No focal neurological deficits. Extremities: Trace edema, no clubbing ,no cyanosis, distal peripheral pulses palpable. Skin: No rashes, lesions or ulcers,no icterus ,no pallor     Data Reviewed: I have personally reviewed following labs and imaging studies  CBC: Recent Labs  Lab 06/03/19 0016 06/04/19 0337 06/05/19 0349 06/21/2019 0501 06/07/19 0253  WBC 8.0 8.1 8.0 13.4* 9.3  NEUTROABS  --   --   --  10.0* 7.5  HGB 10.3* 10.6* 10.5* 11.7* 11.4*  HCT 33.5* 35.1* 34.6* 40.8 37.2*  MCV 99.7 100.9* 103.0* 107.1*  103.6*  PLT 89* 108* 127* 185 950   Basic Metabolic Panel: Recent Labs  Lab 06/03/19 0016 06/04/19 0337 06/05/19 0349 06/18/2019 0501 06/07/19 0253  NA 137 142 144 143 141  K 2.8* 3.9 4.2 4.8 4.4  CL 106 110 115* 114* 106  CO2 21* 21* 19* 18* 18*  GLUCOSE 192* 134* 140* 196* 365*  BUN 57* 53* 49* 47* 51*  CREATININE 2.42* 2.28* 2.26* 2.51* 2.36*  CALCIUM 7.0* 7.0* 6.8* 6.9* 6.3*  PHOS  --  3.3  --   --   --    GFR: Estimated Creatinine Clearance: 33.2 mL/min (A) (by C-G formula based on SCr of 2.36 mg/dL (H)). Liver Function Tests: Recent Labs  Lab 06/03/2019 0501  AST 47*  ALT 43  ALKPHOS 269*  BILITOT 1.3*  PROT 4.9*  ALBUMIN 2.5*   No results for input(s): LIPASE, AMYLASE in the last 168 hours. No results for input(s): AMMONIA in the last 168 hours. Coagulation Profile: No results for input(s): INR, PROTIME in the last 168 hours. Cardiac Enzymes: No results for input(s): CKTOTAL, CKMB, CKMBINDEX, TROPONINI in the last 168 hours. BNP (last 3 results) No results for input(s): PROBNP in the last 8760 hours. HbA1C: No results for input(s): HGBA1C in the last 72 hours. CBG: Recent Labs  Lab 06/16/2019 2228  06/22/2019 2338 06/07/19 0104 06/07/19 0652 06/07/19 0741  GLUCAP 63* 63* 106* 61* 94   Lipid Profile: No results for input(s): CHOL, HDL, LDLCALC, TRIG, CHOLHDL, LDLDIRECT in the last 72 hours. Thyroid Function Tests: No results for input(s): TSH, T4TOTAL, FREET4, T3FREE, THYROIDAB in the last 72 hours. Anemia Panel: No results for input(s): VITAMINB12, FOLATE, FERRITIN, TIBC, IRON, RETICCTPCT in the last 72 hours. Sepsis Labs: Recent Labs  Lab 06/04/2019 0501 06/25/2019 0930  PROCALCITON 0.17  --   LATICACIDVEN 2.9* 1.8    Recent Results (from the past 240 hour(s))  Culture, Urine     Status: Abnormal   Collection Time: 05/29/19  3:14 PM   Specimen: Urine, Random  Result Value Ref Range Status   Specimen Description   Final    URINE, RANDOM Performed  at [facility], [address], [place], [place] [address]    Special Requests   Final    NONE Performed at [facility], [place] [address] [place], [place] [address]    Culture (A)  Final    >=100,000 COLONIES/mL KLEBSIELLA PNEUMONIAE Confirmed Extended Spectrum Beta-Lactamase Producer (ESBL).  In bloodstream infections from ESBL organisms, carbapenems are preferred over piperacillin/tazobactam. They are shown to have a lower risk of mortality. >=100,000 COLONIES/mL VANCOMYCIN RESISTANT ENTEROCOCCUS ISOLATED    Report Status 06/01/2019 FINAL  Final   Organism ID, Bacteria KLEBSIELLA PNEUMONIAE (A)  Final   Organism ID, Bacteria VANCOMYCIN RESISTANT ENTEROCOCCUS ISOLATED (A)  Final      Susceptibility   Klebsiella pneumoniae - MIC*    AMPICILLIN >=32 RESISTANT Resistant     CEFAZOLIN >=64 RESISTANT Resistant     CEFTRIAXONE >=64 RESISTANT Resistant     CIPROFLOXACIN <=0.25 SENSITIVE Sensitive     GENTAMICIN <=1 SENSITIVE Sensitive     IMIPENEM <=0.25 SENSITIVE Sensitive     NITROFURANTOIN 64 INTERMEDIATE Intermediate     TRIMETH/SULFA >=320 RESISTANT Resistant     AMPICILLIN/SULBACTAM >=32 RESISTANT Resistant     PIP/TAZO 8 SENSITIVE Sensitive     Extended ESBL POSITIVE Resistant     * >=100,000 COLONIES/mL KLEBSIELLA PNEUMONIAE   Vancomycin resistant enterococcus isolated - MIC*    AMPICILLIN >=32 RESISTANT Resistant     LEVOFLOXACIN >=8 RESISTANT Resistant     NITROFURANTOIN 64 INTERMEDIATE Intermediate     VANCOMYCIN >=32 RESISTANT Resistant     LINEZOLID 2 SENSITIVE Sensitive     * >=100,000 COLONIES/mL VANCOMYCIN RESISTANT ENTEROCOCCUS ISOLATED  Novel Coronavirus, NAA (Hosp order, Send-out to Ref Lab; TAT 18-24 hrs     Status: None   Collection Time: 05/30/19  6:41 AM   Specimen: Nasopharyngeal Swab; Respiratory  Result Value Ref Range Status   SARS-CoV-2, NAA NOT DETECTED NOT DETECTED Final    Comment: (NOTE) This nucleic acid  amplification test was developed and its performance characteristics determined by [employer]. Nucleic acid amplification tests include PCR and TMA. This test has not been FDA cleared or approved. This test has been authorized by FDA under an Emergency Use Authorization (EUA). This test is only authorized for the duration of time the declaration that circumstances exist justifying the authorization of the emergency use of in vitro diagnostic tests for detection of SARS-CoV-2 virus and/or diagnosis of COVID-19 infection under section 564(b)(1) of the Act, 21 U.S.C. 098JXB-1(Y) (1), unless the authorization is terminated or revoked sooner. When diagnostic testing is negative, the possibility of a false negative result should be considered in the context of a patient's recent exposures  and the presence of clinical signs and symptoms consistent with COVID-19. An individual without symptoms of COVID- 19 and who is not shedding SARS-CoV-2 vi rus would expect to have a negative (not detected) result in this assay. Performed At: [facility] [address] [place], [place] [id number] Rush Farmer MD HG:[id number]    [place]  Final    Comment: Performed at [facility], [place] [address] [place], [place] [address]  Urine culture     Status: Abnormal   Collection Time: 06/05/19  5:45 PM   Specimen: Urine, Catheterized  Result Value Ref Range Status   Specimen Description URINE, CATHETERIZED  Final   Special Requests   Final    PATIENT ON FOLLOWING ZYVOX Performed at [facility], [place] [address] [place], [place] [address]    Culture 80,000 COLONIES/mL YEAST (A)  Final   Report Status 06/11/2019 FINAL  Final         Radiology Studies: Ct Chest Wo Contrast  Result Date: 06/07/2019 CLINICAL DATA:  Shortness of breath. Pulmonary edema suspected. Hospitalized for COVID-19 pneumonia 04/12/2019. EXAM: CT CHEST WITHOUT CONTRAST  TECHNIQUE: Multidetector CT imaging of the chest was performed following the standard protocol without IV contrast. COMPARISON:  05/18/2019 and 03/24/2019 and CT abdomen 04/05/2019 FINDINGS: Cardiovascular: Mild cardiomegaly. Evidence of previous median sternotomy. Calcified plaque over the coronary arteries unchanged. Calcified plaque over the thoracic aorta. Thoracic aorta is otherwise normal in caliber. Remaining vascular structures are unremarkable. Mediastinum/Nodes: Stable 1.3 cm subcarinal lymph node likely reactive. No other significant mediastinal lymph nodes. No significant hilar adenopathy on this noncontrast study. Slight increase in size of a 9 mm lymph node adjacent the distal esophagus. Remaining mediastinal structures are within normal. Lungs/Pleura: Lungs are adequately inflated demonstrate evidence of moderate centrilobular and paraseptal emphysema. Persistent moderate bilateral mixed interstitial airspace density most prominent over the upper lobes. There is mild improvement over the right middle lobe as this process otherwise unchanged. Stable small left pleural effusion with loculated component over the superior aspect of the major fissure. Moderate worsening of moderate size right pleural effusion. Airways are unremarkable. Upper Abdomen: Stable hypodensity over the dome of the liver likely a cyst. Calcified plaque over the abdominal aorta. Abdominal aorta measures 3.4 cm in AP diameter at the level of the takeoff of the celiac axis unchanged from previous. Musculoskeletal: Mild degenerative change of the spine. IMPRESSION: 1. Persistent mixed interstitial airspace process bilaterally most prominent over the upper lobes with interval improvement in airspace density over the right middle lobe. Findings are likely due to ongoing infection. Stable small left effusion and worsening moderate size right pleural effusion. 2. Aortic Atherosclerosis (ICD10-I70.0) and Emphysema (ICD10-J43.9). 3. Aortic  aneurysm NOS (ICD10-I71.9). 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 4.  Cardiomegaly and atherosclerotic coronary artery disease. 5.  Stable 1.5 cm liver hypodensity likely a cyst. Electronically Signed   By: Marin Olp M.D.   On: 06/07/2019 10:15   Dg Chest Port 1 View  Result Date: 06/20/2019 CLINICAL DATA:  Respiratory distress. EXAM: PORTABLE CHEST 1 VIEW COMPARISON:  05/23/2019 FINDINGS: Sternotomy wires unchanged. Lungs are adequately inflated demonstrate slight interval worsening of diffuse bilateral mixed interstitial airspace density most prominent over the central lungs left greater than right. Findings may be due to asymmetric edema versus infection. Possible small amount left pleural fluid. No evidence of pneumothorax. Mild cardiomegaly.  Remainder of the exam is unchanged. IMPRESSION: Mild cardiomegaly with mild interval worsening of bilateral mixed interstitial airspace process left worse than right. Findings may be due to asymmetric edema versus infection. Possible small amount left pleural fluid. Electronically Signed   By: Marin Olp M.D.   On: 06/10/2019 04:04        Scheduled Meds:  calcitRIOL  0.25 mcg Oral Daily   Chlorhexidine Gluconate Cloth  6 each Topical Daily   furosemide  80 mg Intravenous Q8H   Gerhardt's butt cream   Topical QID   insulin aspart  0-5 Units Subcutaneous QHS   insulin aspart  0-9 Units Subcutaneous TID WC   insulin glargine  10 Units Subcutaneous QHS   ipratropium-albuterol  3 mL Inhalation BID   lactobacillus acidophilus  2 tablet Oral TID   levETIRAcetam  500 mg Oral BID   Melatonin  6 mg Oral QHS   metolazone  5 mg Oral Once   metoprolol tartrate  50 mg Oral Q6H   midodrine  5 mg Oral TID WC   mometasone-formoterol  2 puff Inhalation BID   pantoprazole  40 mg Oral Daily   polyethylene glycol  17 g Oral  Daily   [START ON 06/17/2019] predniSONE  10 mg Oral Q breakfast   [START ON 06/14/2019] predniSONE  20 mg Oral Q breakfast   [START ON 06/11/2019] predniSONE  30 mg Oral Q breakfast   [START ON 06/08/2019] predniSONE  40 mg Oral Q breakfast   [START ON 06/20/2019] predniSONE  5 mg Oral Q breakfast   predniSONE  50 mg Oral Q breakfast   rivaroxaban  15 mg Oral Q supper   sodium bicarbonate  1,300 mg Oral BID   sodium chloride flush  10-40 mL Intracatheter Q12H   sodium chloride flush  3 mL Intravenous Q12H   Continuous Infusions:  sodium chloride 250 mL (06/07/19 0539)   amiodarone 30 mg/hr (06/07/19 0345)   calcium gluconate 1,000 mg (06/07/19 0950)     LOS: 1 day    Time spent: 35 mins.More than 50% of that time was spent in counseling and/or coordination of care.      Shelly Coss, MD [facility] Pager [phone]  If 7PM-7AM, please contact night-coverage [email or web address] Password [email or web address] 06/07/2019, 10:22 AM

## 2019-06-07 NOTE — Progress Notes (Addendum)
Dylan Goodman NEPHROLOGY PROGRESS NOTE  Assessment/ Plan: Pt is a 68 y.o. yo male male with CHF, A. fib, DM, CKD with baseline creatinine around 2 admitted to [facility] from 9/14-9/19, readmitted on 10/6 status post cardiac arrest and suffered AKI on CKD required CRRT till 10/29.  #AKI on CKD, baseline creatinine around 2.  AKI likely ATN after cardiac arrest and concomitant with COVID-19 infection.  Initially treated with Lasix and then started CRRT from 10/24-10/29. -Patient had renal recovery therefore the HD catheter was discontinued on 11/6.   -Started IV Lasix on 11/7 for respiratory distress.  He has urine output of around 1.5 L and creatinine level trending down to 2.3.  Still looks volume up on exam and on high flow oxygen.  I will increase Lasix to every 8 hours. A dose of metolazone to augment diuresis.  Monitor BMP, strict ins and out.  #Hypokalemia: Improved.   #Metabolic acidosis: Increased sodium bicarbonate dose.  Monitor lab.  #Acute hypoxic respiratory failure/pneumonia due to COVID-19 virus: Completed course of remdesivir and steroid.  Initially clinically improved.  Now with  fluid overload.  #Acute on chronic systolic CHF exacerbation: IV diuretics as above.  Cardiology is following.  LVEF 20 to 25%.  #Hypotension/possible adrenal insufficiency: On  steroid and midodrine.  Blood pressure is improved.  #ESBL UTI possible acute urinary retention: Meropenem since 11/1.  Urology following for some blood clot during urination.  Need outpatient follow-up with urology per intraurethral catheter.  Subjective: Seen and examined at bedside.  On high flow oxygen.  Still having shortness of breath.  No chest pain, nausea or vomiting. Objective Vital signs in last 24 hours: Vitals:   06/07/19 0415 06/07/19 0500 06/07/19 0719 06/07/19 0826  BP:   111/77 108/70  Pulse:  78 86   Resp: (!) 25 (!) 23 (!) 25   Temp:   (!) 97.4 F (36.3 C)   TempSrc:    Axillary   SpO2: 92% 98% 95%   Weight:      Height:       Weight change:   Intake/Output Summary (Last 24 hours) at 06/07/2019 0829 Last data filed at 06/07/2019 0403 Gross per 24 hour  Intake 10 ml  Output 1575 ml  Net -1565 ml       Labs: Basic Metabolic Panel: Recent Labs  Lab 06/04/19 0337 06/05/19 0349 06/07/2019 0501 06/07/19 0253  NA 142 144 143 141  K 3.9 4.2 4.8 4.4  CL 110 115* 114* 106  CO2 21* 19* 18* 18*  GLUCOSE 134* 140* 196* 365*  BUN 53* 49* 47* 51*  CREATININE 2.28* 2.26* 2.51* 2.36*  CALCIUM 7.0* 6.8* 6.9* 6.3*  PHOS 3.3  --   --   --    Liver Function Tests: Recent Labs  Lab 06/05/2019 0501  AST 47*  ALT 43  ALKPHOS 269*  BILITOT 1.3*  PROT 4.9*  ALBUMIN 2.5*   No results for input(s): LIPASE, AMYLASE in the last 168 hours. No results for input(s): AMMONIA in the last 168 hours. CBC: Recent Labs  Lab 06/03/19 0016 06/04/19 0337 06/05/19 0349 06/05/2019 0501 06/07/19 0253  WBC 8.0 8.1 8.0 13.4* 9.3  NEUTROABS  --   --   --  10.0* 7.5  HGB 10.3* 10.6* 10.5* 11.7* 11.4*  HCT 33.5* 35.1* 34.6* 40.8 37.2*  MCV 99.7 100.9* 103.0* 107.1* 103.6*  PLT 89* 108* 127* 185 157   Cardiac Enzymes: No results for input(s): CKTOTAL, CKMB, CKMBINDEX, TROPONINI  in the last 168 hours. CBG: Recent Labs  Lab 06/26/2019 2228 06/04/2019 2338 06/07/19 0104 06/07/19 0652 06/07/19 0741  GLUCAP 63* 63* 106* 61* 94    Iron Studies: No results for input(s): IRON, TIBC, TRANSFERRIN, FERRITIN in the last 72 hours. Studies/Results: Dg Chest Port 1 View  Result Date: 06/05/2019 CLINICAL DATA:  Respiratory distress. EXAM: PORTABLE CHEST 1 VIEW COMPARISON:  05/23/2019 FINDINGS: Sternotomy wires unchanged. Lungs are adequately inflated demonstrate slight interval worsening of diffuse bilateral mixed interstitial airspace density most prominent over the central lungs left greater than right. Findings may be due to asymmetric edema versus infection. Possible small  amount left pleural fluid. No evidence of pneumothorax. Mild cardiomegaly. Remainder of the exam is unchanged. IMPRESSION: Mild cardiomegaly with mild interval worsening of bilateral mixed interstitial airspace process left worse than right. Findings may be due to asymmetric edema versus infection. Possible small amount left pleural fluid. Electronically Signed   By: Marin Olp M.D.   On: 06/20/2019 04:04    Medications: Infusions: . sodium chloride 250 mL (06/07/19 0539)  . amiodarone 30 mg/hr (06/07/19 0345)  . calcium gluconate      Scheduled Medications: . calcitRIOL  0.25 mcg Oral Daily  . Chlorhexidine Gluconate Cloth  6 each Topical Daily  . furosemide  80 mg Intravenous BID  . Gerhardt's butt cream   Topical QID  . insulin aspart  0-5 Units Subcutaneous QHS  . insulin aspart  0-9 Units Subcutaneous TID WC  . insulin glargine  10 Units Subcutaneous QHS  . ipratropium-albuterol  3 mL Inhalation BID  . lactobacillus acidophilus  2 tablet Oral TID  . levETIRAcetam  500 mg Oral BID  . Melatonin  6 mg Oral QHS  . metoprolol tartrate  50 mg Oral Q6H  . midodrine  5 mg Oral TID WC  . mometasone-formoterol  2 puff Inhalation BID  . pantoprazole  40 mg Oral Daily  . polyethylene glycol  17 g Oral Daily  . [START ON 06/17/2019] predniSONE  10 mg Oral Q breakfast  . [START ON 06/14/2019] predniSONE  20 mg Oral Q breakfast  . [START ON 06/11/2019] predniSONE  30 mg Oral Q breakfast  . [START ON 06/08/2019] predniSONE  40 mg Oral Q breakfast  . [START ON 06/20/2019] predniSONE  5 mg Oral Q breakfast  . predniSONE  50 mg Oral Q breakfast  . rivaroxaban  15 mg Oral Q supper  . sodium bicarbonate  1,300 mg Oral BID  . sodium chloride flush  10-40 mL Intracatheter Q12H  . sodium chloride flush  3 mL Intravenous Q12H    have reviewed scheduled and prn medications.  Physical Exam: General: On nasal cannula, not in distress Heart:RRR, s1s2 nl, no rub Lungs: Bibasal crackles, no  wheezing Abdomen:soft, Non-tender, non-distended Extremities: Lower extremity edema+ Dialysis Access: The HD catheter removed.  Dylan Goodman Dylan Goodman 06/07/2019,8:29 AM  LOS: 1 day  Pager: [phone]

## 2019-06-07 NOTE — Procedures (Signed)
PROCEDURE SUMMARY:  Successful US guided right thoracentesis. Yielded 700 mL of clear yellow fluid. Patient tolerated procedure well. No immediate complications. EBL = trace  Specimen was sent for labs.  Post procedure chest X-ray pending  Arzell Mcgeehan S Emaya Preston PA-C 06/07/2019 11:25 AM

## 2019-06-07 NOTE — Progress Notes (Signed)
Pt on 12L HF O2 via n/c  placed back on bipap for decreased O2 [id number]  and increased sob.

## 2019-06-07 NOTE — Progress Notes (Addendum)
Progress Note   Subjective   Ill, SOB persists, V rates are iimproved  Inpatient Medications    Scheduled Meds: . calcitRIOL  0.25 mcg Oral Daily  . Chlorhexidine Gluconate Cloth  6 each Topical Daily  . furosemide  80 mg Intravenous Q8H  . Gerhardt's butt cream   Topical QID  . insulin aspart  0-5 Units Subcutaneous QHS  . insulin aspart  0-9 Units Subcutaneous TID WC  . insulin glargine  10 Units Subcutaneous QHS  . ipratropium-albuterol  3 mL Inhalation BID  . lactobacillus acidophilus  2 tablet Oral TID  . levETIRAcetam  500 mg Oral BID  . Melatonin  6 mg Oral QHS  . metolazone  5 mg Oral Once  . metoprolol tartrate  50 mg Oral Q6H  . midodrine  5 mg Oral TID WC  . mometasone-formoterol  2 puff Inhalation BID  . pantoprazole  40 mg Oral Daily  . polyethylene glycol  17 g Oral Daily  . [START ON 06/17/2019] predniSONE  10 mg Oral Q breakfast  . [START ON 06/14/2019] predniSONE  20 mg Oral Q breakfast  . [START ON 06/11/2019] predniSONE  30 mg Oral Q breakfast  . [START ON 06/08/2019] predniSONE  40 mg Oral Q breakfast  . [START ON 06/20/2019] predniSONE  5 mg Oral Q breakfast  . predniSONE  50 mg Oral Q breakfast  . rivaroxaban  15 mg Oral Q supper  . sodium bicarbonate  1,300 mg Oral BID  . sodium chloride flush  10-40 mL Intracatheter Q12H  . sodium chloride flush  3 mL Intravenous Q12H   Continuous Infusions: . sodium chloride 250 mL (06/07/19 0539)  . amiodarone 30 mg/hr (06/07/19 0345)  . calcium gluconate     PRN Meds: sodium chloride, acetaminophen, LORazepam, ondansetron (ZOFRAN) IV, sodium chloride flush, sodium chloride flush   Vital Signs    Vitals:   06/07/19 0415 06/07/19 0500 06/07/19 0719 06/07/19 0826  BP:   111/77 108/70  Pulse:  78 86   Resp: (!) 25 (!) 23 (!) 25   Temp:   (!) 97.4 F (36.3 C)   TempSrc:   Axillary   SpO2: 92% 98% 95%   Weight:      Height:        Intake/Output Summary (Last 24 hours) at 06/07/2019 0912 Last data  filed at 06/07/2019 0900 Gross per 24 hour  Intake 10 ml  Output 2375 ml  Net -2365 ml   Filed Weights   06/07/19 0300  Weight: 89.8 kg    Telemetry    afib with improved V rates - Personally Reviewed  Physical Exam   GEN- The patient is ill appearing  Head- normocephalic, atraumatic Eyes-  Sclera clear, conjunctiva pink Ears- hearing intact Oropharynx- clear Neck- supple, Lungs-   normal work of breathing Heart- irrgular rhythm  Extremities- + dependant edema  MS- diffuse atrophy Psych- euthymic mood, full affect   Labs    Chemistry Recent Labs  Lab 06/05/19 0349 06/07/2019 0501 06/07/19 0253  NA 144 143 141  K 4.2 4.8 4.4  CL 115* 114* 106  CO2 19* 18* 18*  GLUCOSE 140* 196* 365*  BUN 49* 47* 51*  CREATININE 2.26* 2.51* 2.36*  CALCIUM 6.8* 6.9* 6.3*  PROT  --  4.9*  --   ALBUMIN  --  2.5*  --   AST  --  47*  --   ALT  --  43  --   ALKPHOS  --  41*  --   BILITOT  --  1.3*  --   GFRNONAA 29* 26* 28*  GFRAA 34* 30* 32*  ANIONGAP 10 11 17*     Hematology Recent Labs  Lab 06/05/19 0349 06/02/2019 0501 06/07/19 0253  WBC 8.0 13.4* 9.3  RBC 3.36* 3.81* 3.59*  HGB 10.5* 11.7* 11.4*  HCT 34.6* 40.8 37.2*  MCV 103.0* 107.1* 103.6*  MCH 31.3 30.7 31.8  MCHC 30.3 28.7* 30.6  RDW 20.3* 20.2* 19.8*  PLT 127* 185 157    Patient Profile:   Aristides Luckey is a 68 y.o. 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 Tawanna Solo.   Assessment & Plan    1.  Acute hypoxic  respiratory failure with acute on chronic combined systolic and diastolic dysfunction. Multifactorial Worsening EF likely due to AF with RVR Cannot consider cath currently given renal failure Continue diuresis as renal function allows IM team managing  2. afib with RVR Likely driven by underlying medical disease V rates have improved overnight Now on xarelto, though not recently therapeutic. I would like to try to achieve sinus rhythm Will tentatively place on schedule for TEE guided [facility] tomorrow Make NPO after midnight  3. H/o CAD with prior CABG and PVIs Not currently a candidate for cath No active chest pain  4. Acute renal failure on chronic stage IV renal disease Nephrology following and directing diuresis  General cardiology to follow.  Prognosis is guarded.  He is very ill.  Thompson Grayer MD, [facility] 06/07/2019 9:12 AM

## 2019-06-07 NOTE — Progress Notes (Signed)
PT Cancellation Note  Patient Details Name: Dylan Goodman MRN: [id number] DOB: 05-16-1951   Cancelled Treatment:    Reason Eval/Treat Not Completed: Patient not medically ready. Returned to bipap this AM due to SpO2 84% on 12L O2 via HFNC. Guarded prognosis per MD progress note. Tentatively scheduled for TEE tomorrow, 11/9. PT to follow and proceed with eval when pt medically able.    Lorriane Shire 06/07/2019, 9:42 AM   Lorrin Goodell, PT  Office # [phone] Pager [phone]

## 2019-06-08 ENCOUNTER — Inpatient Hospital Stay (HOSPITAL_COMMUNITY): Payer: Federal, State, Local not specified - PPO

## 2019-06-08 ENCOUNTER — Inpatient Hospital Stay (HOSPITAL_COMMUNITY): Payer: Federal, State, Local not specified - PPO | Admitting: Occupational Therapy

## 2019-06-08 DIAGNOSIS — N184 Chronic kidney disease, stage 4 (severe): Secondary | ICD-10-CM

## 2019-06-08 DIAGNOSIS — I25810 Atherosclerosis of coronary artery bypass graft(s) without angina pectoris: Secondary | ICD-10-CM

## 2019-06-08 LAB — GLUCOSE, CAPILLARY
Glucose-Capillary: 121 mg/dL — ABNORMAL HIGH (ref 70–99)
Glucose-Capillary: 73 mg/dL (ref 70–99)
Glucose-Capillary: 121 mg/dL — ABNORMAL HIGH (ref 70–99)
Glucose-Capillary: 68 mg/dL — ABNORMAL LOW (ref 70–99)
Glucose-Capillary: 122 mg/dL — ABNORMAL HIGH (ref 70–99)

## 2019-06-08 LAB — BASIC METABOLIC PANEL
Anion gap: 13 (ref 5–15)
BUN: 59 mg/dL — ABNORMAL HIGH (ref 8–23)
CO2: 22 mmol/L (ref 22–32)
Calcium: 6.4 mg/dL — CL (ref 8.9–10.3)
Chloride: 107 mmol/L (ref 98–111)
Creatinine, Ser: 2.71 mg/dL — ABNORMAL HIGH (ref 0.61–1.24)
GFR calc Af Amer: 27 mL/min — ABNORMAL LOW (ref >60)
GFR calc non Af Amer: 24 mL/min — ABNORMAL LOW (ref >60)
Glucose, Bld: 136 mg/dL — ABNORMAL HIGH (ref 70–99)
Potassium: 3.4 mmol/L — ABNORMAL LOW (ref 3.5–5.1)
Sodium: 142 mmol/L (ref 135–145)

## 2019-06-08 MED ORDER — SODIUM CHLORIDE 0.9 % IV SOLN
2.0000 g | INTRAVENOUS | Status: AC
  Administered 2019-06-08 – 2019-06-13 (×6): 2 g via INTRAVENOUS
  Filled 2019-06-08 (×6): qty 2

## 2019-06-08 MED ORDER — CALCIUM GLUCONATE-NACL 1-0.675 GM/50ML-% IV SOLN: 1.0000 g | Freq: Once | INTRAVENOUS | Status: DC

## 2019-06-08 MED ORDER — POTASSIUM CHLORIDE CRYS ER 20 MEQ PO TBCR
40.0000 meq | EXTENDED_RELEASE_TABLET | Freq: Once | ORAL | Status: AC
  Administered 2019-06-08: 40 meq via ORAL
  Filled 2019-06-08: qty 2

## 2019-06-08 MED ORDER — CALCIUM GLUCONATE-NACL 1-0.675 GM/50ML-% IV SOLN
1.0000 g | Freq: Once | INTRAVENOUS | Status: AC
  Administered 2019-06-08: 1000 mg via INTRAVENOUS
  Filled 2019-06-08: qty 50

## 2019-06-08 NOTE — Progress Notes (Addendum)
PROGRESS NOTE    Dylan Goodman  TDH:[id number] DOB: 1950-11-21 DOA: 06/28/2019 PCP: Steele Sizer, MD   Brief Narrative:  Patient is a 68 year old male with history of  atrial fibrillation, GI bleed, coronary artery disease status post CABG, nasopharyngeal cancer, cardiomyopathy, chronic depression, CKD stage 4, diabetes mellitus, GI bleed , peripheral vascular disease, previous smoker, recent Covid-19 infection who was sent from inpatient rehab for the evaluation of acute respiratory distress.  He was just discharged from here to inpatient rehab yesterday.  He developed respiratory distress overnight and had to be kept on BiPAP.  He was admitted for the management of acute on chronic CHF exacerbation.he was also noted to have peripheral edema, diffuse Rales.  Chest x-ray concerning for pulmonary edema.  Patient has also been seen by nephrology.  Started on Lasix 80 mg IV three times a day . CT chest without contrast done today showed possible multifocal pneumonia so he has been started on antibiotics.  He is also being followed by cardiology for new finding of worsening systolic heart failure and A. fib with RVR.Plan for cardioversion . Off bipap today   Assessment & Plan:   Principal Problem:   Respiratory failure, acute ([place]) Active Problems:   CKD (chronic kidney disease), stage IV (HCC)   Coronary artery disease involving coronary bypass graft of native heart   IDA (iron deficiency anemia)   Type 2 diabetes mellitus with stage 4 chronic kidney disease, with long-term current use of insulin (HCC)   Metabolic acidosis   Atrial fibrillation with RVR (HCC)   Seizure (HCC)   Acute on chronic systolic (congestive) heart failure (HCC)   Acute on chronic respiratory failure: Secondary to multifocal pneumonia, pulmonary edema. CT chest showed :Persistent mixed interstitial airspace process bilaterally most prominent over the upper lobes with interval improvement in airspace density  over the right middle lobe. Findings are concerning for  ongoing infection. Also showed table small left effusion and worsening moderate size right pleural effusion. Started on  on vancomycin and cefepime.  Also underwent  US guided thoracentesis of the right sided moderate pleural effusion with removal of 700 ml of clear fluid.  Continue Lasix as per nephrology.ABG on 06/07/2019 showed normal CO2 but decreased oxygenation. He was intubated for airway protection on last admission when he presented with seizures.  Acute on chronic systolic congestive heart failure: Echo done on this admission showed ejection fraction to 20-25%.  Diffuse hypokinesis, severely reduced left ventricular function. echocardiogram done on 05/08/2019 had shown ejection fraction of 40- 45% . Cardiology following.  Continue diuresis  A. fib with RVR: Hospital course remarkable for Afib with RVR  .  Cardiology following.  On Eliquis at home for anticoagulation .  Anticoagulation changed to Xarelto.  Cardiology planning for DCCV.   Heart rate was controlled this morning.  On amiodarone drip.  Also on rate control with metoprolol.  AKI on CKD stage 4:  Baseline creatinine around 2.5-3.  Nephrology following.  Continue diuresis  Seizure:  Continue Keppra.  Follow-up with neurology as an outpatient.  Insulin-dependent diabetic mellitus: Hemoglobin A1c of 6.7 in March 10, 2019.  On insulin at home.  Continue current insulin regimen.  Chronic hypotension: Suspected to be from adrenal insufficiency.  Currently on midodrine.Also on tapering steroids.  Hypocalcemia/hypokalemia: Being supplemented  Urinary retention: Has Foley catheter.  Needs to follow-up with urology as an outpatient.  Debility/deconditioning: Was just discharged to rehab.  PT/OT following        DVT prophylaxis:  Xarelto Code Status: Full Family Communication: Discussed with wife on phone on 06/07/19  Disposition Plan: Undetermined at this point.  Back  to back to CIR when medically stable   Consultants: Cardiology, nephrology  Procedures: None  Antimicrobials:  Anti-infectives (From admission, onward)   Start     Dose/Rate Route Frequency Ordered Stop   06/08/19 2156  ceFEPIme (MAXIPIME) 2 g in sodium chloride 0.9 % 100 mL IVPB     2 g 200 mL/hr over 30 Minutes Intravenous Every 24 hours 06/08/19 0836     06/08/19 1000  vancomycin (VANCOCIN) IVPB 1000 mg/200 mL premix     1,000 mg 200 mL/hr over 60 Minutes Intravenous Every 24 hours 06/07/19 1109     06/07/19 1115  ceFEPIme (MAXIPIME) 2 g in sodium chloride 0.9 % 100 mL IVPB  Status:  Discontinued     2 g 200 mL/hr over 30 Minutes Intravenous Every 12 hours 06/07/19 1109 06/08/19 0836   06/07/19 1115  vancomycin (VANCOCIN) 1,500 mg in sodium chloride 0.9 % 500 mL IVPB     1,500 mg 250 mL/hr over 120 Minutes Intravenous  Once 06/07/19 1109 06/07/19 1412      Subjective: Patient seen and examined the bedside this morning.  Currently hemodynamically stable.  He was off BiPAP but on high flow nasal cannula.  Looks slightly better today.  He said he feels better but he clearly looks dyspneic.  He participated with physical therapy today.  Objective: Vitals:   06/08/19 0500 06/08/19 0612 06/08/19 0852 06/08/19 1124  BP:   114/73 99/71  Pulse:   82 77  Resp:  17  20  Temp:    (!) 97.5 F (36.4 C)  TempSrc:    Oral  SpO2:  95%  96%  Weight: 83.5 kg     Height:        Intake/Output Summary (Last 24 hours) at 06/08/2019 1147 Last data filed at 06/08/2019 1139 Gross per 24 hour  Intake 1173.24 ml  Output 2000 ml  Net -826.76 ml   Filed Weights   06/07/19 0300 06/08/19 0500  Weight: 89.8 kg 83.5 kg    Examination:  General exam: In Mild respiratory distress, deconditioned, debilitated HEENT:PERRL,Oral mucosa moist, Ear/Nose normal on gross exam Respiratory system: Bilateral decreased air entry, bilateral basal crackles Cardiovascular system: A. fib. No JVD, murmurs,  rubs, gallops or clicks.  Trace pedal edema. Gastrointestinal system: Abdomen is nondistended, soft and nontender. No organomegaly or masses felt. Normal bowel sounds heard. Central nervous system: Alert and oriented. No focal neurological deficits. Extremities: Trace edema, no clubbing ,no cyanosis, distal peripheral pulses palpable. Skin: No rashes, lesions or ulcers,no icterus ,no pallor     Data Reviewed: I have personally reviewed following labs and imaging studies  CBC: Recent Labs  Lab 06/03/19 0016 06/04/19 0337 06/05/19 0349 06/02/2019 0501 06/07/19 0253  WBC 8.0 8.1 8.0 13.4* 9.3  NEUTROABS  --   --   --  10.0* 7.5  HGB 10.3* 10.6* 10.5* 11.7* 11.4*  HCT 33.5* 35.1* 34.6* 40.8 37.2*  MCV 99.7 100.9* 103.0* 107.1* 103.6*  PLT 89* 108* 127* 185 536   Basic Metabolic Panel: Recent Labs  Lab 06/04/19 0337 06/05/19 0349 06/05/2019 0501 06/07/19 0253 06/08/19 0340  NA 142 144 143 141 142  K 3.9 4.2 4.8 4.4 3.4*  CL 110 115* 114* 106 107  CO2 21* 19* 18* 18* 22  GLUCOSE 134* 140* 196* 365* 136*  BUN 53* 49* 47* 51* 59*  CREATININE 2.28* 2.26* 2.51* 2.36* 2.71*  CALCIUM 7.0* 6.8* 6.9* 6.3* 6.4*  PHOS 3.3  --   --   --   --    GFR: Estimated Creatinine Clearance: 28.9 mL/min (A) (by C-G formula based on SCr of 2.71 mg/dL (H)). Liver Function Tests: Recent Labs  Lab 06/10/2019 0501  AST 47*  ALT 43  ALKPHOS 269*  BILITOT 1.3*  PROT 4.9*  ALBUMIN 2.5*   No results for input(s): LIPASE, AMYLASE in the last 168 hours. No results for input(s): AMMONIA in the last 168 hours. Coagulation Profile: No results for input(s): INR, PROTIME in the last 168 hours. Cardiac Enzymes: No results for input(s): CKTOTAL, CKMB, CKMBINDEX, TROPONINI in the last 168 hours. BNP (last 3 results) No results for input(s): PROBNP in the last 8760 hours. HbA1C: No results for input(s): HGBA1C in the last 72 hours. CBG: Recent Labs  Lab 06/07/19 1206 06/07/19 1710 06/07/19 2104  06/08/19 0611 06/08/19 1126  GLUCAP 96 129* 163* 121* 73   Lipid Profile: No results for input(s): CHOL, HDL, LDLCALC, TRIG, CHOLHDL, LDLDIRECT in the last 72 hours. Thyroid Function Tests: No results for input(s): TSH, T4TOTAL, FREET4, T3FREE, THYROIDAB in the last 72 hours. Anemia Panel: No results for input(s): VITAMINB12, FOLATE, FERRITIN, TIBC, IRON, RETICCTPCT in the last 72 hours. Sepsis Labs: Recent Labs  Lab 06/05/2019 0501 06/14/2019 0930 06/07/19 0253  PROCALCITON 0.17  --  2.10  LATICACIDVEN 2.9* 1.8  --     Recent Results (from the past 240 hour(s))  Culture, Urine     Status: Abnormal   Collection Time: 05/29/19  3:14 PM   Specimen: Urine, Random  Result Value Ref Range Status   Specimen Description   Final    URINE, RANDOM Performed at [facility], [place] [address], [place], [place] [address]    Special Requests   Final    NONE Performed at [facility], [place] [address] [place], [place] [address]    Culture (A)  Final    >=100,000 COLONIES/mL KLEBSIELLA PNEUMONIAE Confirmed Extended Spectrum Beta-Lactamase Producer (ESBL).  In bloodstream infections from ESBL organisms, carbapenems are preferred over piperacillin/tazobactam. They are shown to have a lower risk of mortality. >=100,000 COLONIES/mL VANCOMYCIN RESISTANT ENTEROCOCCUS ISOLATED    Report Status 06/01/2019 FINAL  Final   Organism ID, Bacteria KLEBSIELLA PNEUMONIAE (A)  Final   Organism ID, Bacteria VANCOMYCIN RESISTANT ENTEROCOCCUS ISOLATED (A)  Final      Susceptibility   Klebsiella pneumoniae - MIC*    AMPICILLIN >=32 RESISTANT Resistant     CEFAZOLIN >=64 RESISTANT Resistant     CEFTRIAXONE >=64 RESISTANT Resistant     CIPROFLOXACIN <=0.25 SENSITIVE Sensitive     GENTAMICIN <=1 SENSITIVE Sensitive     IMIPENEM <=0.25 SENSITIVE Sensitive     NITROFURANTOIN 64 INTERMEDIATE Intermediate     TRIMETH/SULFA >=320 RESISTANT Resistant      AMPICILLIN/SULBACTAM >=32 RESISTANT Resistant     PIP/TAZO 8 SENSITIVE Sensitive     Extended ESBL POSITIVE Resistant     * >=100,000 COLONIES/mL KLEBSIELLA PNEUMONIAE   Vancomycin resistant enterococcus isolated - MIC*    AMPICILLIN >=32 RESISTANT Resistant     LEVOFLOXACIN >=8 RESISTANT Resistant     NITROFURANTOIN 64 INTERMEDIATE Intermediate     VANCOMYCIN >=32 RESISTANT Resistant     LINEZOLID 2 SENSITIVE Sensitive     * >=100,000 COLONIES/mL VANCOMYCIN RESISTANT ENTEROCOCCUS ISOLATED  Novel Coronavirus, NAA (Hosp order, Send-out to [facility]; TAT 18-24 hrs  Status: None   Collection Time: 05/30/19  6:41 AM   Specimen: Nasopharyngeal Swab; Respiratory  Result Value Ref Range Status   SARS-CoV-2, NAA NOT DETECTED NOT DETECTED Final    Comment: (NOTE) This nucleic acid amplification test was developed and its performance characteristics determined by [employer]. Nucleic acid amplification tests include PCR and TMA. This test has not been FDA cleared or approved. This test has been authorized by FDA under an Emergency Use Authorization (EUA). This test is only authorized for the duration of time the declaration that circumstances exist justifying the authorization of the emergency use of in vitro diagnostic tests for detection of SARS-CoV-2 virus and/or diagnosis of COVID-19 infection under section 564(b)(1) of the Act, 21 U.S.C. 425ZDG-3(O) (1), unless the authorization is terminated or revoked sooner. When diagnostic testing is negative, the possibility of a false negative result should be considered in the context of a patient's recent exposures and the presence of clinical signs and symptoms consistent with COVID-19. An individual without symptoms of COVID- 19 and who is not shedding SARS-CoV-2 vi rus would expect to have a negative (not detected) result in this assay. Performed At: [facility] [address] [place], [place] [id number] Rush Farmer MD  JO:[id number]    [place]  Final    Comment: Performed at [facility], [place] [address] [place], [place] [address]  Urine culture     Status: Abnormal   Collection Time: 06/05/19  5:45 PM   Specimen: Urine, Catheterized  Result Value Ref Range Status   Specimen Description URINE, CATHETERIZED  Final   Special Requests   Final    PATIENT ON FOLLOWING ZYVOX Performed at [facility], [place] [address], [place], [place] [address]    Culture 80,000 COLONIES/mL YEAST (A)  Final   Report Status 06/28/2019 FINAL  Final  Gram stain     Status: None   Collection Time: 06/07/19 11:22 AM   Specimen: Lung, Right; Pleural Fluid  Result Value Ref Range Status   Specimen Description PLEURAL RIGHT  Final   Special Requests NONE  Final   Gram Stain   Final    FEW WBC PRESENT,BOTH PMN AND MONONUCLEAR NO ORGANISMS SEEN Performed at [facility], [place] [address], [place], [place] [address]    Report Status 06/07/2019 FINAL  Final  Culture, body fluid-bottle     Status: None (Preliminary result)   Collection Time: 06/07/19 11:23 AM   Specimen: Pleura  Result Value Ref Range Status   Specimen Description PLEURAL RIGHT  Final   Special Requests NONE  Final   Culture   Final    NO GROWTH < 24 HOURS Performed at [facility], [place] [address], [place], [place] [address]    Report Status PENDING  Incomplete         Radiology Studies: Dg Chest 1 View  Result Date: 06/07/2019 CLINICAL DATA:  Pleural effusion, status post thoracentesis EXAM: CHEST  1 VIEW COMPARISON:  06/27/2019 FINDINGS: No significant change in appearance of the chest status post thoracentesis with diffuse bilateral interstitial airspace opacity, left pleural thickening and loculated pleural fluid, and a layering right pleural effusion not appreciably changed compared to prior radiographic appearance. No pneumothorax. No new airspace opacity. Cardiomegaly status post median sternotomy.  IMPRESSION: 1. No significant change in appearance of the chest status post thoracentesis with diffuse bilateral interstitial airspace opacity, left pleural thickening and loculated pleural fluid, and a layering right pleural effusion  not appreciably changed compared to prior radiographic appearance. 2.  No pneumothorax. 3.  No new airspace opacity. Electronically Signed   By: Eddie Candle M.D.   On: 06/07/2019 13:04   Ct Chest Wo Contrast  Result Date: 06/07/2019 CLINICAL DATA:  Shortness of breath. Pulmonary edema suspected. Hospitalized for COVID-19 pneumonia 04/12/2019. EXAM: CT CHEST WITHOUT CONTRAST TECHNIQUE: Multidetector CT imaging of the chest was performed following the standard protocol without IV contrast. COMPARISON:  05/18/2019 and 03/24/2019 and CT abdomen 04/05/2019 FINDINGS: Cardiovascular: Mild cardiomegaly. Evidence of previous median sternotomy. Calcified plaque over the coronary arteries unchanged. Calcified plaque over the thoracic aorta. Thoracic aorta is otherwise normal in caliber. Remaining vascular structures are unremarkable. Mediastinum/Nodes: Stable 1.3 cm subcarinal lymph node likely reactive. No other significant mediastinal lymph nodes. No significant hilar adenopathy on this noncontrast study. Slight increase in size of a 9 mm lymph node adjacent the distal esophagus. Remaining mediastinal structures are within normal. Lungs/Pleura: Lungs are adequately inflated demonstrate evidence of moderate centrilobular and paraseptal emphysema. Persistent moderate bilateral mixed interstitial airspace density most prominent over the upper lobes. There is mild improvement over the right middle lobe as this process otherwise unchanged. Stable small left pleural effusion with loculated component over the superior aspect of the major fissure. Moderate worsening of moderate size right pleural effusion. Airways are unremarkable. Upper Abdomen: Stable hypodensity over the dome of the liver  likely a cyst. Calcified plaque over the abdominal aorta. Abdominal aorta measures 3.4 cm in AP diameter at the level of the takeoff of the celiac axis unchanged from previous. Musculoskeletal: Mild degenerative change of the spine. IMPRESSION: 1. Persistent mixed interstitial airspace process bilaterally most prominent over the upper lobes with interval improvement in airspace density over the right middle lobe. Findings are likely due to ongoing infection. Stable small left effusion and worsening moderate size right pleural effusion. 2. Aortic Atherosclerosis (ICD10-I70.0) and Emphysema (ICD10-J43.9). 3. 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 4.  Cardiomegaly and atherosclerotic coronary artery disease. 5.  Stable 1.5 cm liver hypodensity likely a cyst. Electronically Signed   By: Marin Olp M.D.   On: 06/07/2019 10:15   US Thoracentesis Asp Pleural Space W/img Guide  Result Date: 06/07/2019 INDICATION: Acute on chronic congestive heart failure. Right pleural effusion. Request for diagnostic therapeutic thoracentesis. EXAM: ULTRASOUND GUIDED RIGHT THORACENTESIS MEDICATIONS: 1% lidocaine 10 mL COMPLICATIONS: None immediate. PROCEDURE: An ultrasound guided thoracentesis was thoroughly discussed with the patient and questions answered. The benefits, risks, alternatives and complications were also discussed. The patient understands and wishes to proceed with the procedure. Written consent was obtained. Ultrasound was performed to localize and mark an adequate pocket of fluid in the right chest. The area was then prepped and draped in the normal sterile fashion. 1% Lidocaine was used for local anesthesia. Under ultrasound guidance a 6 Fr Safe-T-Centesis catheter was introduced. Thoracentesis was performed. The catheter was removed and a dressing applied.  FINDINGS: A total of approximately 700 mL of clear yellow fluid was removed. Samples were sent to the laboratory as requested by the clinical team. IMPRESSION: Successful ultrasound guided right thoracentesis yielding 700 mL of pleural fluid. No pneumothorax on post-procedure chest x-ray. Read by: Gareth Eagle, PA-C Electronically Signed   By: Lucrezia Europe M.D.   On: 06/07/2019 13:07        Scheduled Meds: . calcitRIOL  0.25  mcg Oral Daily  . Chlorhexidine Gluconate Cloth  6 each Topical Daily  . furosemide  80 mg Intravenous Q8H  . Gerhardt's butt cream   Topical QID  . insulin aspart  0-5 Units Subcutaneous QHS  . insulin aspart  0-9 Units Subcutaneous TID WC  . insulin glargine  25 Units Subcutaneous QHS  . ipratropium-albuterol  3 mL Inhalation BID  . lactobacillus acidophilus  2 tablet Oral TID  . levETIRAcetam  500 mg Oral BID  . Melatonin  6 mg Oral QHS  . metoprolol tartrate  50 mg Oral Q6H  . midodrine  5 mg Oral TID WC  . mometasone-formoterol  2 puff Inhalation BID  . pantoprazole  40 mg Oral Daily  . polyethylene glycol  17 g Oral Daily  . [START ON 06/17/2019] predniSONE  10 mg Oral Q breakfast  . [START ON 06/14/2019] predniSONE  20 mg Oral Q breakfast  . [START ON 06/11/2019] predniSONE  30 mg Oral Q breakfast  . predniSONE  40 mg Oral Q breakfast  . [START ON 06/20/2019] predniSONE  5 mg Oral Q breakfast  . rivaroxaban  15 mg Oral Q supper  . sodium bicarbonate  1,300 mg Oral BID  . sodium chloride flush  10-40 mL Intracatheter Q12H  . sodium chloride flush  3 mL Intravenous Q12H   Continuous Infusions: . sodium chloride 10 mL/hr at 06/08/19 0630  . amiodarone 30 mg/hr (06/08/19 0630)  . ceFEPime (MAXIPIME) IV    . vancomycin 1,000 mg (06/08/19 0932)     LOS: 2 days    Time spent: 35 mins.More than 50% of that time was spent in counseling and/or coordination of care.      Shelly Coss, MD [facility] Pager [phone]  If 7PM-7AM, please  contact night-coverage [email or web address] Password [facility] 06/08/2019, 11:47 AM

## 2019-06-08 NOTE — Progress Notes (Signed)
CRITICAL VALUE ALERT  Critical Value:  Calcium 6.4  Date & Time Notied:  06/08/19 at 0436  Provider Notified: B[place]heimer  Orders Received/Actions taken: Orders obtained for IV calcium gluconate.  See MAR for administration.

## 2019-06-08 NOTE — TOC Initial Note (Addendum)
Transition of Care [facility]) - Initial/Assessment Note    Patient Details  Name: Dylan Goodman MRN: [id number] Date of Birth: 1951/04/22  Transition of Care [facility] CM/SW Contact:    Zenon Mayo, RN Phone Number: 06/08/2019, 3:37 PM  Clinical Narrative:                 Patient from home with wife, he is indep at home , he is negative for covid, has PE. [place] Tues or Thurs, had thorancetensis yesterday, ov iv amio and eliquis.  Patient is being changed to xarelto, benefit check in process.  NCM offered [facility] for CHF screen, patient states he is not sure right now that he wants a HHRN.    Expected Discharge Plan: IP Rehab Facility Barriers to Discharge: No Barriers Identified   Patient Goals and CMS Choice Patient states their goals for this hospitalization and ongoing recovery are:: get better      Expected Discharge Plan and Services Expected Discharge Plan: [place] In-house Referral: NA Discharge Planning Services: CM Consult Post Acute Care Choice: IP Rehab Living arrangements for the past 2 months: Single Family Home                 DME Arranged: (NA)         HH Arranged: NA          Prior Living Arrangements/Services Living arrangements for the past 2 months: Single Family Home Lives with:: Spouse Patient language and need for interpreter reviewed:: Yes Do you feel safe going back to the place where you live?: Yes      Need for Family Participation in Patient Care: Yes (Comment) Care giver support system in place?: Yes (comment)   Criminal Activity/Legal Involvement Pertinent to Current Situation/Hospitalization: No - Comment as needed  Activities of Daily Living Home Assistive Devices/Equipment: CBG Meter, Hearing aid, Eyeglasses, Walker (specify type) ADL Screening (condition at time of admission) Patient's cognitive ability adequate to safely complete daily activities?: Yes Is the patient deaf or have difficulty hearing?:  Yes Does the patient have difficulty seeing, even when wearing glasses/contacts?: No Does the patient have difficulty concentrating, remembering, or making decisions?: No Patient able to express need for assistance with ADLs?: Yes Does the patient have difficulty dressing or bathing?: Yes Independently performs ADLs?: No Does the patient have difficulty walking or climbing stairs?: Yes Weakness of Legs: Both Weakness of Arms/Hands: None  Permission Sought/Granted                  Emotional Assessment Appearance:: Appears stated age Attitude/Demeanor/Rapport: Engaged Affect (typically observed): Appropriate Orientation: : Oriented to Place, Oriented to Self, Oriented to  Time, Oriented to Situation Alcohol / Substance Use: Not Applicable Psych Involvement: No (comment)  Admission diagnosis:  Acute on Chronic systolic congestive heart failure Patient Active Problem List   Diagnosis Date Noted  . Acute on chronic systolic (congestive) heart failure ([place]) 06/12/2019  . Critical illness myopathy 06/05/2019  . Pressure injury of skin 05/27/2019  . Acute pulmonary edema (HCC)   . Hypoxia   . Acute respiratory disease due to COVID-19 virus 05/19/2019  . Seizure ([place])   . Prolonged QT interval 05/08/2019  . Acute respiratory failure with hypoxemia ([place]) 05/06/2019  . Cardiac arrest ([place])   . Acute kidney injury superimposed on chronic kidney disease ([place]) 04/13/2019  . Hypocalcemia 04/13/2019  . Hyponatremia 04/13/2019  . Metabolic acidosis 02/77/4128  . Atrial fibrillation with RVR ([place]) 04/13/2019  . Respiratory failure,  acute ([place]) 04/13/2019  . Dizziness 03/12/2019  . Atherosclerosis of aorta ([place]) 01/29/2019  . Type 2 diabetes mellitus with stage 4 chronic kidney disease, with long-term current use of insulin ([place]) 09/13/2018  . Hyperlipidemia, mixed 09/13/2018  . Senile purpura ([place]) 09/08/2018  . Morbid obesity ([place]) 09/08/2018  . Chronic diarrhea of unknown origin  09/04/2018  . Menetrier's disease (hyperplastic hypersecretory gastropathy) 09/04/2018  . CKD (chronic kidney disease), stage IV ([place]) 09/03/2018  . History of gastric ulcer 09/03/2018  . Gastritis 09/03/2018  . GERD (gastroesophageal reflux disease) 09/03/2018  . IDA (iron deficiency anemia) 09/03/2018  . Hypomagnesemia 09/03/2018  . History of cancer chemotherapy 09/03/2018  . Chronic systolic CHF (congestive heart failure), NYHA class 2 ([place]) 09/02/2018  . Atherosclerosis of native arteries of extremity with intermittent claudication ([place]) 07/21/2018  . Benign essential HTN 07/21/2018  . Bilateral carotid artery stenosis 07/21/2018  . 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 [place] [address] Phone: [phone] Fax: [phone]     Social Determinants of Health (SDOH) Interventions    Readmission Risk Interventions Readmission Risk Prevention Plan 06/08/2019 05/08/2019  Transportation Screening Complete Complete  Medication Review ([place]) Complete Complete  PCP or Specialist appointment within 3-5 days of discharge Complete -  [place] or Home Care Consult Complete (No Data)  SW Recovery Care/Counseling Consult Complete -  Palliative Care Screening Not Applicable -  [place] Not Applicable -

## 2019-06-08 NOTE — Evaluation (Signed)
Physical Therapy Evaluation Patient Details Name: Dylan Goodman MRN: [id number] DOB: [birth year] Today's Date: 06/08/2019   History of Present Illness  68 year old male with history of  atrial fibrillation, GI bleed, coronary artery disease status post CABG, nasopharyngeal cancer, cardiomyopathy, chronic depression, CKD stage 4, diabetes mellitus, GI bleed , peripheral vascular disease, previous smoker, recent Covid-19 infection who was sent from inpatient rehab for the evaluation of acute respiratory distress.  Clinical Impression  Pt demonstrates deficits in functional mobility, gait, balance, endurance, strength, power. Pt intermittently agitated during session, likely due to pain of buttock wound. Pt declines attempts at [place] mobility at this time but does express a desire to return to CIR. Pt requires min-modA for all bed mobility at this time, being significantly limited by pain. Pt will benefit from continued acute PT services to improve LE strength, mobility, and to restore independence.    Follow Up Recommendations CIR    Equipment Recommendations  (TBD)    Recommendations for Other Services Rehab consult;OT consult     Precautions / Restrictions Precautions Precautions: Fall Precaution Comments: buttocks pain from wound Restrictions Weight Bearing Restrictions: No      Mobility  Bed Mobility Overal bed mobility: Needs Assistance Bed Mobility: Rolling;Sit to Supine;Sidelying to Sit Rolling: Min guard Sidelying to sit: Mod assist;HOB elevated   Sit to supine: Mod assist   General bed mobility comments: pt requiring assistance for LE management and to pull up into sitting. Pt with pain from wound on buttocks sticking to pad in bed  Transfers Overall transfer level: (pt declines attempts at OOB activity)                  Ambulation/Gait                Stairs            Wheelchair Mobility    Modified Rankin (Stroke Patients Only)        Balance Overall balance assessment: Needs assistance Sitting-balance support: Feet supported;No upper extremity supported Sitting balance-Leahy Scale: Good Sitting balance - Comments: supervision of 1 person                                     Pertinent Vitals/Pain Pain Assessment: Faces Faces Pain Scale: Hurts whole lot Pain Location: buttocks Pain Descriptors / Indicators: Burning;Grimacing;Guarding;Sharp Pain Intervention(s): Limited activity within patient's tolerance;Monitored during session    Home Living Family/patient expects to be discharged to:: Private residence Living Arrangements: Spouse/significant other Available Help at Discharge: Family;Available 24 hours/day Type of Home: House Home Access: Level entry     Home Layout: Two level;Bed/bath upstairs Home Equipment: Grab bars - toilet;Walker - 2 wheels      Prior Function Level of Independence: Independent         Comments: reports dizzy spells, only used RW to walk to ambulate, normally does not use     Hand Dominance   Dominant Hand: Right    Extremity/Trunk Assessment   Upper Extremity Assessment Upper Extremity Assessment: Generalized weakness    Lower Extremity Assessment Lower Extremity Assessment: Generalized weakness(grossly 4-/5 BLE)    Cervical / Trunk Assessment Cervical / Trunk Assessment: Normal  Communication   Communication: HOH  Cognition Arousal/Alertness: Awake/alert Behavior During Therapy: WFL for tasks assessed/performed Overall Cognitive Status: Within Functional Limits for tasks assessed  General Comments General comments (skin integrity, edema, etc.): pt on 15L HFNC during session, desaturates to 91% briefly but asymptomatic, otherwise SpO2 from 94-100%    Exercises General Exercises - Lower Extremity Ankle Circles/Pumps: AROM;Both;10 reps Gluteal Sets: AROM;Both;Other reps (comment)(1  rep) Long Arc Quad: AROM;Both;5 reps   Assessment/Plan    PT Assessment Patient needs continued PT services  PT Problem List Decreased strength;Decreased activity tolerance;Decreased balance;Decreased mobility;Decreased knowledge of use of DME;Decreased safety awareness;Decreased knowledge of precautions;Pain       PT Treatment Interventions DME instruction;Gait training;Stair training;Functional mobility training;Therapeutic activities;Therapeutic exercise;Balance training;Patient/family education    PT Goals (Current goals can be found in the Care Plan section)  Acute Rehab PT Goals Patient Stated Goal: get stronger, go to rehab PT Goal Formulation: With patient Time For Goal Achievement: 06/22/19 Potential to Achieve Goals: Good    Frequency Min 3X/week   Barriers to discharge        Co-evaluation               AM-PAC PT "6 Clicks" Mobility  Outcome Measure Help needed turning from your back to your side while in a flat bed without using bedrails?: A Little Help needed moving from lying on your back to sitting on the side of a flat bed without using bedrails?: A Lot Help needed moving to and from a bed to a chair (including a wheelchair)?: A Lot Help needed standing up from a chair using your arms (e.g., wheelchair or bedside chair)?: A Lot Help needed to walk in hospital room?: Total Help needed climbing 3-5 steps with a railing? : Total 6 Click Score: 11    End of Session Equipment Utilized During Treatment: Oxygen Activity Tolerance: Patient limited by pain Patient left: in bed;with call bell/phone within reach;with nursing/sitter in room Nurse Communication: Mobility status PT Visit Diagnosis: Unsteadiness on feet (R26.81);Other abnormalities of gait and mobility (R26.89);Muscle weakness (generalized) (M62.81);Difficulty in walking, not elsewhere classified (R26.2);Pain Pain - Right/Left: (buttocks)    Time: 0812-0906 PT Time Calculation (min) (ACUTE ONLY):  54 min   Charges:   PT Evaluation $PT Eval Low Complexity: 1 Low          Zenaida Niece, PT, DPT Acute Rehabilitation Pager: [phone]   Zenaida Niece 06/08/2019, 9:40 AM

## 2019-06-08 NOTE — Progress Notes (Signed)
Inpatient Rehabilitation-Admissions Coordinator   Received screen request. This patient is familiar to me from recent CIR admission 11/6 prior to his return to acute on 11/7 due to medical concerns. I will follow this patient to determine if he is appropriate for a return to CIR once medically ready.   Dylan Goodman, OTR/L  Rehab Admissions Coordinator  [phone] 06/08/2019 9:45 AM

## 2019-06-08 NOTE — TOC Benefit Eligibility Note (Signed)
Transition of Care [facility]) Benefit Eligibility Note    Patient Details  Name: Dylan Goodman MRN: [id number] Date of Birth: [birth year]   Medication/Dose: Xarelto 20mg  daily, is this covered  Covered?: Yes  Tier: 2 Drug  Prescription [facility]age [facility]  Spoke with Person/Company/Phone Number:: [employer] [employer] / [phone]  Co-Pay: 104.20 for a 30 day supply Retail/ 90.00 for 3 month Supply Mail order  Prior Approval: No  Deductible: Unmet  Additional Notes: Rivaroxaban is non formulary    Orbie Pyo Phone Number: 06/08/2019, 4:17 PM

## 2019-06-08 NOTE — Progress Notes (Signed)
Inpatient Rehabilitation  Patient information reviewed and entered into eRehab system by Roshonda Sperl M. Danilo Cappiello, M.A., CCC/SLP, PPS Coordinator.  Information including medical coding, functional ability and quality indicators will be reviewed and updated through discharge.    

## 2019-06-08 NOTE — Progress Notes (Signed)
Progress Note  Patient Name: Dylan Goodman Date of Encounter: 06/08/2019  Primary Cardiologist: Corey Skains, MD   Subjective   On high flow [place] today but feeling improved slightly. Able to sit on the edge of the bed with PT. No chest pain. He was not familiar with TEE-CV, discussed today. He would like time to consider procedure.   Inpatient Medications    Scheduled Meds:  calcitRIOL  0.25 mcg Oral Daily   Chlorhexidine Gluconate Cloth  6 each Topical Daily   furosemide  80 mg Intravenous Q8H   Gerhardt's butt cream   Topical QID   insulin aspart  0-5 Units Subcutaneous QHS   insulin aspart  0-9 Units Subcutaneous TID WC   insulin glargine  25 Units Subcutaneous QHS   ipratropium-albuterol  3 mL Inhalation BID   lactobacillus acidophilus  2 tablet Oral TID   levETIRAcetam  500 mg Oral BID   Melatonin  6 mg Oral QHS   metoprolol tartrate  50 mg Oral Q6H   midodrine  5 mg Oral TID WC   mometasone-formoterol  2 puff Inhalation BID   pantoprazole  40 mg Oral Daily   polyethylene glycol  17 g Oral Daily   potassium chloride  40 mEq Oral Once   [START ON 06/17/2019] predniSONE  10 mg Oral Q breakfast   [START ON 06/14/2019] predniSONE  20 mg Oral Q breakfast   [START ON 06/11/2019] predniSONE  30 mg Oral Q breakfast   predniSONE  40 mg Oral Q breakfast   [START ON 06/20/2019] predniSONE  5 mg Oral Q breakfast   rivaroxaban  15 mg Oral Q supper   sodium bicarbonate  1,300 mg Oral BID   sodium chloride flush  10-40 mL Intracatheter Q12H   sodium chloride flush  3 mL Intravenous Q12H   Continuous Infusions:  sodium chloride 10 mL/hr at 06/08/19 0630   amiodarone 30 mg/hr (06/08/19 0630)   ceFEPime (MAXIPIME) IV Stopped (06/07/19 2226)   vancomycin     PRN Meds: sodium chloride, acetaminophen, LORazepam, ondansetron (ZOFRAN) IV, sodium chloride flush, sodium chloride flush   Vital Signs    Vitals:   06/08/19 0334 06/08/19 0400  06/08/19 0500 06/08/19 0612  BP:  112/72    Pulse: 78 (!) 101    Resp: 18 18  17   Temp:  97.8 F (36.6 C)    TempSrc:  Oral    SpO2: 97% 96%  95%  Weight:   83.5 kg   Height:        Intake/Output Summary (Last 24 hours) at 06/08/2019 0814 Last data filed at 06/08/2019 0630 Gross per 24 hour  Intake 1173.24 ml  Output 2100 ml  Net -926.76 ml   Last 3 Weights 06/08/2019 06/07/2019 06/05/2019  Weight (lbs) 184 lb 198 lb 205 lb  Weight (kg) 83.462 kg 89.812 kg 92.987 kg      Telemetry    Atrial fibrillation, rates 70s-90s, predominantly in the 80s - Personally Reviewed  ECG    06/01/2019 afib RVR with nonspecific ST changed - Personally Reviewed  Physical Exam   GEN: frail appearing gentleman, appears older than stated age Neck: JVD to jaw at 60 degrees Cardiac: irregularly irregular S1 and S2, no murmurs, rubs, or gallops.  Respiratory: distant breath sounds, coarse, bilateral rales at bases GI: Soft, nontender, non-distended  MS: No edema; No deformity. Neuro:  Nonfocal  Psych: Normal affect   Labs    High Sensitivity Troponin:   Recent  Labs  Lab 05/24/19 1630 05/24/19 1845 05/26/19 0650 05/27/19 0615  TROPONINIHS 261* 774* 2,883* 1,611*      Chemistry Recent Labs  Lab 06/04/2019 0501 06/07/19 0253 06/08/19 0340  NA 143 141 142  K 4.8 4.4 3.4*  CL 114* 106 107  CO2 18* 18* 22  GLUCOSE 196* 365* 136*  BUN 47* 51* 59*  CREATININE 2.51* 2.36* 2.71*  CALCIUM 6.9* 6.3* 6.4*  PROT 4.9*  --   --   ALBUMIN 2.5*  --   --   AST 47*  --   --   ALT 43  --   --   ALKPHOS 269*  --   --   BILITOT 1.3*  --   --   GFRNONAA 26* 28* 24*  GFRAA 30* 32* 27*  ANIONGAP 11 17* 13     Hematology Recent Labs  Lab 06/05/19 0349 06/04/2019 0501 06/07/19 0253  WBC 8.0 13.4* 9.3  RBC 3.36* 3.81* 3.59*  HGB 10.5* 11.7* 11.4*  HCT 34.6* 40.8 37.2*  MCV 103.0* 107.1* 103.6*  MCH 31.3 30.7 31.8  MCHC 30.3 28.7* 30.6  RDW 20.3* 20.2* 19.8*  PLT 127* 185 157    BNPNo  results for input(s): BNP, PROBNP in the last 168 hours.   DDimer No results for input(s): DDIMER in the last 168 hours.   Radiology    Dg Chest 1 View  Result Date: 06/07/2019 CLINICAL DATA:  Pleural effusion, status post thoracentesis EXAM: CHEST  1 VIEW COMPARISON:  05/31/2019 FINDINGS: No significant change in appearance of the chest status post thoracentesis with diffuse bilateral interstitial airspace opacity, left pleural thickening and loculated pleural fluid, and a layering right pleural effusion not appreciably changed compared to prior radiographic appearance. No pneumothorax. No new airspace opacity. Cardiomegaly status post median sternotomy. IMPRESSION: 1. No significant change in appearance of the chest status post thoracentesis with diffuse bilateral interstitial airspace opacity, left pleural thickening and loculated pleural fluid, and a layering right pleural effusion not appreciably changed compared to prior radiographic appearance. 2.  No pneumothorax. 3.  No new airspace opacity. Electronically Signed   By: Eddie Candle M.D.   On: 06/07/2019 13:04   Ct Chest Wo Contrast  Result Date: 06/07/2019 CLINICAL DATA:  Shortness of breath. Pulmonary edema suspected. Hospitalized for COVID-19 pneumonia 04/12/2019. EXAM: CT CHEST WITHOUT CONTRAST TECHNIQUE: Multidetector CT imaging of the chest was performed following the standard protocol without IV contrast. COMPARISON:  05/18/2019 and 03/24/2019 and CT abdomen 04/05/2019 FINDINGS: Cardiovascular: Mild cardiomegaly. Evidence of previous median sternotomy. Calcified plaque over the coronary arteries unchanged. Calcified plaque over the thoracic aorta. Thoracic aorta is otherwise normal in caliber. Remaining vascular structures are unremarkable. Mediastinum/Nodes: Stable 1.3 cm subcarinal lymph node likely reactive. No other significant mediastinal lymph nodes. No significant hilar adenopathy on this noncontrast study. Slight increase in size  of a 9 mm lymph node adjacent the distal esophagus. Remaining mediastinal structures are within normal. Lungs/Pleura: Lungs are adequately inflated demonstrate evidence of moderate centrilobular and paraseptal emphysema. Persistent moderate bilateral mixed interstitial airspace density most prominent over the upper lobes. There is mild improvement over the right middle lobe as this process otherwise unchanged. Stable small left pleural effusion with loculated component over the superior aspect of the major fissure. Moderate worsening of moderate size right pleural effusion. Airways are unremarkable. Upper Abdomen: Stable hypodensity over the dome of the liver likely a cyst. Calcified plaque over the abdominal aorta. Abdominal aorta measures 3.4 cm in AP diameter  at the level of the takeoff of the celiac axis unchanged from previous. Musculoskeletal: Mild degenerative change of the spine. IMPRESSION: 1. Persistent mixed interstitial airspace process bilaterally most prominent over the upper lobes with interval improvement in airspace density over the right middle lobe. Findings are likely due to ongoing infection. Stable small left effusion and worsening moderate size right pleural effusion. 2. Aortic Atherosclerosis (ICD10-I70.0) and Emphysema (ICD10-J43.9). 3. 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 4.  Cardiomegaly and atherosclerotic coronary artery disease. 5.  Stable 1.5 cm liver hypodensity likely a cyst. Electronically Signed   By: Marin Olp M.D.   On: 06/07/2019 10:15   US Thoracentesis Asp Pleural Space W/img Guide  Result Date: 06/07/2019 INDICATION: Acute on chronic congestive heart failure. Right pleural effusion. Request for diagnostic therapeutic thoracentesis. EXAM: ULTRASOUND GUIDED RIGHT THORACENTESIS MEDICATIONS: 1% lidocaine 10 mL  COMPLICATIONS: None immediate. PROCEDURE: An ultrasound guided thoracentesis was thoroughly discussed with the patient and questions answered. The benefits, risks, alternatives and complications were also discussed. The patient understands and wishes to proceed with the procedure. Written consent was obtained. Ultrasound was performed to localize and mark an adequate pocket of fluid in the right chest. The area was then prepped and draped in the normal sterile fashion. 1% Lidocaine was used for local anesthesia. Under ultrasound guidance a 6 Fr Safe-T-Centesis catheter was introduced. Thoracentesis was performed. The catheter was removed and a dressing applied. FINDINGS: A total of approximately 700 mL of clear yellow fluid was removed. Samples were sent to the laboratory as requested by the clinical team. IMPRESSION: Successful ultrasound guided right thoracentesis yielding 700 mL of pleural fluid. No pneumothorax on post-procedure chest x-ray. Read by: Gareth Eagle, PA-C Electronically Signed   By: Lucrezia Europe M.D.   On: 06/07/2019 13:07    Cardiac Studies   Echo 06/09/2019 1. Left ventricular ejection fraction, by visual estimation, is 20 to 25%. The left ventricle has severely decreased function. Left ventricular septal wall thickness was normal. There is no left ventricular hypertrophy.  2. Left ventricular diastolic parameters are indeterminate.  3. Moderately dilated left ventricular internal cavity size.  4. Diffuse hypokinesis EF has decreased since echo done 05/08/19.  5. Global right ventricle has normal systolic function.The right ventricular size is normal. No increase in right ventricular wall thickness.  6. Left atrial size was severely dilated.  7. Right atrial size was normal.  8. The mitral valve is normal in structure. Mild mitral valve regurgitation.  9. The tricuspid valve is normal in structure. Tricuspid valve regurgitation moderate. 10. The aortic valve is tricuspid. Aortic valve  regurgitation is mild. Mild to moderate aortic valve sclerosis/calcification without any evidence of aortic stenosis. 11. The pulmonic valve was grossly normal. Pulmonic valve regurgitation is mild. 12. Moderately elevated pulmonary artery systolic pressure. 13. The interatrial septum was not well visualized.  Patient Profile     68 y.o. male with a hx of CAD s/p [id number] and PCI (details unclear), persistent atrial fibrillation,chronic systolic CHF, PAD (h/orevascularization stenting and fem-pop bypass grafting), carotid artery disease (chronic L ICAocclusion per notes), HTN, DM,chronic diarrhea, nasopharyngeal CA, anemia, gastric WUJWJ,XBJ,[id number] PNAwith complications, CKD stage IV (recently requiring CRRT)who is being followed for the evaluation of worsening LV functionat the request of Dr. Tawanna Solo. He has multiple recent hospitalizations, including for Covid 03/2019 and post cardiac arrest 10/6-10/29.  Assessment &  Plan   Acute hypoxic respiratory failure, pleural effusions, possible multifocal pneumonia, acute on chronic systolic and diastolic heart failure with EF 20-25%: -s/p thoracentesis; R thora with 700cc clear yellow fluid 11/8 -diuresis per nephrology given renal failure, currently dosing 80 mg IV furosemide q8 hours with metolazone PRN -Cr trending up, recent nadir 2.28, went from 2.36 to 2.71 today -has required BiPAP, today on HFNC -on antibiotics for HCAP per primary team -worsening EF likely 2/2 afib RVR -most recent Covid swab negative -net negative 2.5 L per measured amounts, but likely more as weight went from 89.9 kg to 83.5 kg today  Atrial fibrillation with RVR:  -on rivaroxaban, tolerating. Dose is 15 mg given renal disease; if CrCl drops to <15, will need to adjust therapy. Started 11/7, 2 doses thus far. Per consult note 11/7, would need full dose apixaban but reduced dose rivaroxaban, hence the change. -no slots for TEE-CV today. Need to optimize  respiratory function as well. We discussed the procedure at length today, including risk/benefit. We both agree that optimization of his respiratory status would be preferred, and he is well rate controlled today. Would aim for TEE-CV on 11/11 at the earliest. Ok to eat from my standpoint, defer to primary team if there is concern he will need to return to NIPPV. -severely dilated LA. Unclear whether sinus rhythm will hold, but with low EF and volume overload, worth an attempt at SR. However, with his current respiratory status, he is high risk for intubation with sedation, so would prefer additional diuresis first. -on amiodarone drip as well as metoprolol 50 mg q6 hours. Continue -calcium repleted, follow K/Mg  CAD, with prior CABG: no acute complaints for ischemia. With his multiple medical issues and severity of his current disease, would not check troponin unless he develops symptoms concerning for ACS.  Per primary/additional consulting teams: Acute on chronic kidney failure: baseline stage 4, required CRRT with recent admission Hypotension: chronic, ? 2/2 adrenal insufficiency, on midodrine and prednisone ESBL UTI with urinary retentions History of seiures, on Keppra Insulin-dependent type II diabetes  For questions or updates, please contact [place] Please consult [email or web address] for contact info under     Signed, Buford Dresser, MD  06/08/2019, 8:14 AM

## 2019-06-08 NOTE — Progress Notes (Signed)
Dylan Goodman  Assessment/ Plan: Pt is a 68 y.o. yo male male with CHF, A. fib, DM, CKD with baseline creatinine around 2 admitted to [facility] from 9/14-9/19, readmitted on 10/6 status post cardiac arrest and suffered AKI on CKD required CRRT till 10/29.  #AKI on CKD, baseline creatinine around 2.  AKI likely ATN after cardiac arrest and concomitant with COVID-19 infection.  Initially treated with Lasix and then started CRRT from 10/24-10/29. -Patient had renal recovery therefore the HD catheter was discontinued on 11/6.   -Started IV Lasix on 11/7 for respiratory distress.  Yesterday continue 3 times daily Lasix and single dose of metolazone improved volume status, at least 1 L net negative and weight trending downwards. Renal function slightly worse this morning, likely related to increased diuretics. Today continue 3 times daily Lasix, hold metolazone.  #Hypokalemia: Improved.  Stable  #Metabolic acidosis: Serum Bicarbonate stable at 22, continue to monitor.  His volume status might not handle chronic sodium bicarbonate.  #Acute hypoxic respiratory failure/pneumonia due to COVID-19 virus: Completed course of remdesivir and steroid.  Initially clinically improved.  Slowly improving #Acute on chronic systolic CHF exacerbation: IV diuretics as above.  Cardiology is following.  LVEF 20 to 25%.  #Hypotension/possible adrenal insufficiency: On  steroid and midodrine.  Blood pressure is improved.  #ESBL UTI possible acute urinary retention: Antibiotics per primary service urology following for some blood clot during urination.  Need outpatient follow-up with urology per intraurethral catheter.  Subjective: No complaints, says breathing has improved over the past several days, and out of sinus rhythm.  Excellent urine output yesterday with 3 times daily Lasix and single dose of metolazone.  On high flow nasal cannula/BiPAP.  Objective Vital  signs in last 24 hours: Vitals:   06/08/19 0334 06/08/19 0400 06/08/19 0500 06/08/19 0612  BP:  112/72    Pulse: 78 (!) 101    Resp: 18 18  17   Temp:  97.8 F (36.6 C)    TempSrc:  Oral    SpO2: 97% 96%  95%  Weight:   83.5 kg   Height:       Weight change: -6.35 kg  Intake/Output Summary (Last 24 hours) at 06/08/2019 0823 Last data filed at 06/08/2019 0630 Gross per 24 hour  Intake 1173.24 ml  Output 2100 ml  Net -926.76 ml       Labs: Basic Metabolic Panel: Recent Labs  Lab 06/04/19 0337  06/01/2019 0501 06/07/19 0253 06/08/19 0340  NA 142   < > 143 141 142  K 3.9   < > 4.8 4.4 3.4*  CL 110   < > 114* 106 107  CO2 21*   < > 18* 18* 22  GLUCOSE 134*   < > 196* 365* 136*  BUN 53*   < > 47* 51* 59*  CREATININE 2.28*   < > 2.51* 2.36* 2.71*  CALCIUM 7.0*   < > 6.9* 6.3* 6.4*  PHOS 3.3  --   --   --   --    < > = values in this interval not displayed.   Liver Function Tests: Recent Labs  Lab 06/23/2019 0501  AST 47*  ALT 43  ALKPHOS 269*  BILITOT 1.3*  PROT 4.9*  ALBUMIN 2.5*   No results for input(s): LIPASE, AMYLASE in the last 168 hours. No results for input(s): AMMONIA in the last 168 hours. CBC: Recent Labs  Lab 06/03/19 0016 06/04/19 1194 06/05/19 0349 06/12/2019 0501  06/07/19 0253  WBC 8.0 8.1 8.0 13.4* 9.3  NEUTROABS  --   --   --  10.0* 7.5  HGB 10.3* 10.6* 10.5* 11.7* 11.4*  HCT 33.5* 35.1* 34.6* 40.8 37.2*  MCV 99.7 100.9* 103.0* 107.1* 103.6*  PLT 89* 108* 127* 185 157   Cardiac Enzymes: No results for input(s): CKTOTAL, CKMB, CKMBINDEX, TROPONINI in the last 168 hours. CBG: Recent Labs  Lab 06/07/19 0741 06/07/19 1206 06/07/19 1710 06/07/19 2104 06/08/19 0611  GLUCAP 94 96 129* 163* 121*    Iron Studies: No results for input(s): IRON, TIBC, TRANSFERRIN, FERRITIN in the last 72 hours. Studies/Results: Dg Chest 1 View  Result Date: 06/07/2019 CLINICAL DATA:  Pleural effusion, status post thoracentesis EXAM: CHEST  1 VIEW  COMPARISON:  06/14/2019 FINDINGS: No significant change in appearance of the chest status post thoracentesis with diffuse bilateral interstitial airspace opacity, left pleural thickening and loculated pleural fluid, and a layering right pleural effusion not appreciably changed compared to prior radiographic appearance. No pneumothorax. No new airspace opacity. Cardiomegaly status post median sternotomy. IMPRESSION: 1. No significant change in appearance of the chest status post thoracentesis with diffuse bilateral interstitial airspace opacity, left pleural thickening and loculated pleural fluid, and a layering right pleural effusion not appreciably changed compared to prior radiographic appearance. 2.  No pneumothorax. 3.  No new airspace opacity. Electronically Signed   By: Eddie Candle M.D.   On: 06/07/2019 13:04   Ct Chest Wo Contrast  Result Date: 06/07/2019 CLINICAL DATA:  Shortness of breath. Pulmonary edema suspected. Hospitalized for COVID-19 pneumonia 04/12/2019. EXAM: CT CHEST WITHOUT CONTRAST TECHNIQUE: Multidetector CT imaging of the chest was performed following the standard protocol without IV contrast. COMPARISON:  05/18/2019 and 03/24/2019 and CT abdomen 04/05/2019 FINDINGS: Cardiovascular: Mild cardiomegaly. Evidence of previous median sternotomy. Calcified plaque over the coronary arteries unchanged. Calcified plaque over the thoracic aorta. Thoracic aorta is otherwise normal in caliber. Remaining vascular structures are unremarkable. Mediastinum/Nodes: Stable 1.3 cm subcarinal lymph node likely reactive. No other significant mediastinal lymph nodes. No significant hilar adenopathy on this noncontrast study. Slight increase in size of a 9 mm lymph node adjacent the distal esophagus. Remaining mediastinal structures are within normal. Lungs/Pleura: Lungs are adequately inflated demonstrate evidence of moderate centrilobular and paraseptal emphysema. Persistent moderate bilateral mixed  interstitial airspace density most prominent over the upper lobes. There is mild improvement over the right middle lobe as this process otherwise unchanged. Stable small left pleural effusion with loculated component over the superior aspect of the major fissure. Moderate worsening of moderate size right pleural effusion. Airways are unremarkable. Upper Abdomen: Stable hypodensity over the dome of the liver likely a cyst. Calcified plaque over the abdominal aorta. Abdominal aorta measures 3.4 cm in AP diameter at the level of the takeoff of the celiac axis unchanged from previous. Musculoskeletal: Mild degenerative change of the spine. IMPRESSION: 1. Persistent mixed interstitial airspace process bilaterally most prominent over the upper lobes with interval improvement in airspace density over the right middle lobe. Findings are likely due to ongoing infection. Stable small left effusion and worsening moderate size right pleural effusion. 2. Aortic Atherosclerosis (ICD10-I70.0) and Emphysema (ICD10-J43.9). 3. 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 4.  Cardiomegaly and atherosclerotic coronary artery disease. 5.  Stable 1.5 cm liver hypodensity likely a cyst. Electronically Signed   By: Marni Griffon.D.  On: 06/07/2019 10:15   US Thoracentesis Asp Pleural Space W/img Guide  Result Date: 06/07/2019 INDICATION: Acute on chronic congestive heart failure. Right pleural effusion. Request for diagnostic therapeutic thoracentesis. EXAM: ULTRASOUND GUIDED RIGHT THORACENTESIS MEDICATIONS: 1% lidocaine 10 mL COMPLICATIONS: None immediate. PROCEDURE: An ultrasound guided thoracentesis was thoroughly discussed with the patient and questions answered. The benefits, risks, alternatives and complications were also discussed. The patient understands and wishes to proceed with  the procedure. Written consent was obtained. Ultrasound was performed to localize and mark an adequate pocket of fluid in the right chest. The area was then prepped and draped in the normal sterile fashion. 1% Lidocaine was used for local anesthesia. Under ultrasound guidance a 6 Fr Safe-T-Centesis catheter was introduced. Thoracentesis was performed. The catheter was removed and a dressing applied. FINDINGS: A total of approximately 700 mL of clear yellow fluid was removed. Samples were sent to the laboratory as requested by the clinical team. IMPRESSION: Successful ultrasound guided right thoracentesis yielding 700 mL of pleural fluid. No pneumothorax on post-procedure chest x-ray. Read by: Gareth Eagle, PA-C Electronically Signed   By: Lucrezia Europe M.D.   On: 06/07/2019 13:07    Medications: Infusions: . sodium chloride 10 mL/hr at 06/08/19 0630  . amiodarone 30 mg/hr (06/08/19 0630)  . ceFEPime (MAXIPIME) IV Stopped (06/07/19 2226)  . vancomycin      Scheduled Medications: . calcitRIOL  0.25 mcg Oral Daily  . Chlorhexidine Gluconate Cloth  6 each Topical Daily  . furosemide  80 mg Intravenous Q8H  . Gerhardt's butt cream   Topical QID  . insulin aspart  0-5 Units Subcutaneous QHS  . insulin aspart  0-9 Units Subcutaneous TID WC  . insulin glargine  25 Units Subcutaneous QHS  . ipratropium-albuterol  3 mL Inhalation BID  . lactobacillus acidophilus  2 tablet Oral TID  . levETIRAcetam  500 mg Oral BID  . Melatonin  6 mg Oral QHS  . metoprolol tartrate  50 mg Oral Q6H  . midodrine  5 mg Oral TID WC  . mometasone-formoterol  2 puff Inhalation BID  . pantoprazole  40 mg Oral Daily  . polyethylene glycol  17 g Oral Daily  . potassium chloride  40 mEq Oral Once  . [START ON 06/17/2019] predniSONE  10 mg Oral Q breakfast  . [START ON 06/14/2019] predniSONE  20 mg Oral Q breakfast  . [START ON 06/11/2019] predniSONE  30 mg Oral Q breakfast  . predniSONE  40 mg Oral Q breakfast  . [START ON  06/20/2019] predniSONE  5 mg Oral Q breakfast  . rivaroxaban  15 mg Oral Q supper  . sodium bicarbonate  1,300 mg Oral BID  . sodium chloride flush  10-40 mL Intracatheter Q12H  . sodium chloride flush  3 mL Intravenous Q12H    have reviewed scheduled and prn medications.  Physical Exam: General: On nasal cannula, not in distress Heart:RRR, s1s2 nl, no rub Lungs: Bibasal crackles, no wheezing Abdomen:soft, Non-tender, non-distended Extremities: Trace lower extremity edema Dialysis Access: No HD catheter present  Izsak Meir B Hans Rusher 06/08/2019,8:23 AM  LOS: 2 days

## 2019-06-09 LAB — BASIC METABOLIC PANEL
Anion gap: 12 (ref 5–15)
BUN: 64 mg/dL — ABNORMAL HIGH (ref 8–23)
CO2: 25 mmol/L (ref 22–32)
Calcium: 6.1 mg/dL — CL (ref 8.9–10.3)
Chloride: 104 mmol/L (ref 98–111)
Creatinine, Ser: 2.73 mg/dL — ABNORMAL HIGH (ref 0.61–1.24)
GFR calc Af Amer: 27 mL/min — ABNORMAL LOW (ref >60)
GFR calc non Af Amer: 23 mL/min — ABNORMAL LOW (ref >60)
Glucose, Bld: 74 mg/dL (ref 70–99)
Potassium: 3.5 mmol/L (ref 3.5–5.1)
Sodium: 141 mmol/L (ref 135–145)

## 2019-06-09 LAB — GLUCOSE, CAPILLARY
Glucose-Capillary: 80 mg/dL (ref 70–99)
Glucose-Capillary: 67 mg/dL — ABNORMAL LOW (ref 70–99)
Glucose-Capillary: 92 mg/dL (ref 70–99)
Glucose-Capillary: 106 mg/dL — ABNORMAL HIGH (ref 70–99)
Glucose-Capillary: 179 mg/dL — ABNORMAL HIGH (ref 70–99)
Glucose-Capillary: 213 mg/dL — ABNORMAL HIGH (ref 70–99)

## 2019-06-09 LAB — PATHOLOGIST SMEAR REVIEW

## 2019-06-09 MED ORDER — TRAZODONE HCL 50 MG PO TABS
50.0000 mg | ORAL_TABLET | Freq: Every day | ORAL | Status: DC
  Administered 2019-06-09: 50 mg via ORAL
  Filled 2019-06-09: qty 1

## 2019-06-09 MED ORDER — CALCIUM GLUCONATE-NACL 1-0.675 GM/50ML-% IV SOLN
1.0000 g | Freq: Once | INTRAVENOUS | Status: AC
  Administered 2019-06-09: 1000 mg via INTRAVENOUS
  Filled 2019-06-09: qty 50

## 2019-06-09 MED ORDER — GUAIFENESIN ER 600 MG PO TB12
600.0000 mg | ORAL_TABLET | Freq: Two times a day (BID) | ORAL | Status: DC
  Administered 2019-06-09 – 2019-06-14 (×12): 600 mg via ORAL
  Filled 2019-06-09 (×13): qty 1

## 2019-06-09 MED ORDER — AMIODARONE HCL 200 MG PO TABS
400.0000 mg | ORAL_TABLET | Freq: Two times a day (BID) | ORAL | Status: DC
  Administered 2019-06-10 – 2019-06-12 (×5): 400 mg via ORAL
  Filled 2019-06-09 (×5): qty 2

## 2019-06-09 NOTE — Evaluation (Signed)
Occupational Therapy Evaluation Patient Details Name: Dylan Goodman MRN: [id number] DOB: 06-Oct-1950 Today's Date: 06/09/2019    History of Present Illness 68 year old male with history of  atrial fibrillation, GI bleed, coronary artery disease status post CABG, nasopharyngeal cancer, cardiomyopathy, chronic depression, CKD stage 4, diabetes mellitus, GI bleed , peripheral vascular disease, previous smoker, recent Covid-19 infection who was sent from inpatient rehab for the evaluation of acute respiratory distress.   Clinical Impression   PTA patient independent and driving. Admitted for above and limited by problem list below, including decreased activity tolerance, generalized weakness.  He currently requires min assist for UB ADLs, max assist for LB ADLs, and min-mod assist for bed mobility.  Able to maintain sitting balance at EOB with supervision dynamically, but poor tolerance to activity and fatigues easily. Declined further mobility today, endorsing fear of falling due to weakness. Pt on 15L HFNC during session with SpO2 >90%, but RR up to 36 with minimal activity. Reports mild dizziness seated EOB, BP supine 118/76 and EOB 108/84.  Patient will benefit from continued OT services while admitted and after dc at CIR level in order to optimize independence and return to PLOF with ADLs/mobility.      Follow Up Recommendations  CIR;Supervision/Assistance - 24 hour    Equipment Recommendations  Other (comment)(TBD at next venue of care)    Recommendations for Other Services       Precautions / Restrictions Precautions Precautions: Fall Precaution Comments: buttocks pain from wound Restrictions Weight Bearing Restrictions: No      Mobility Bed Mobility Overal bed mobility: Needs Assistance Bed Mobility: Supine to Sit;Sit to Supine     Supine to sit: Min assist;HOB elevated Sit to supine: Mod assist;HOB elevated   General bed mobility comments: patient transitioned to EOB  with support of trunk given increased time and effort, returned to supine with B LE support   Transfers                 General transfer comment: pt declined     Balance Overall balance assessment: Needs assistance Sitting-balance support: No upper extremity supported;Feet supported Sitting balance-Leahy Scale: Fair Sitting balance - Comments: supervision at EOB                                    ADL either performed or assessed with clinical judgement   ADL Overall ADL's : Needs assistance/impaired     Grooming: Set up;Sitting   Upper Body Bathing: Set up;Sitting   Lower Body Bathing: Maximal assistance;Sitting/lateral leans;Bed level   Upper Body Dressing : Minimal assistance;Sitting   Lower Body Dressing: Maximal assistance;Sitting/lateral leans;Bed level     Toilet Transfer Details (indicate cue type and reason): deferred, pt declined         Functional mobility during ADLs: Minimal assistance;Moderate assistance(limited to EOB only) General ADL Comments: pt limited by generalized weakness, decreased activity tolerance      Vision   Vision Assessment?: No apparent visual deficits     Perception     Praxis      Pertinent Vitals/Pain Pain Assessment: Faces Faces Pain Scale: Hurts even more Pain Location: buttocks Pain Descriptors / Indicators: Discomfort;Grimacing;Guarding Pain Intervention(s): Limited activity within patient's tolerance;Monitored during session;Repositioned     Hand Dominance Right   Extremity/Trunk Assessment Upper Extremity Assessment Upper Extremity Assessment: Generalized weakness   Lower Extremity Assessment Lower Extremity Assessment: Defer to PT evaluation  Communication Communication Communication: HOH   Cognition Arousal/Alertness: Awake/alert Behavior During Therapy: WFL for tasks assessed/performed Overall Cognitive Status: Within Functional Limits for tasks assessed                                  General Comments: appears WFL    General Comments  pt on 15L HFNC during session with SpO2 >90%; RR upto 36, HR stable; BP supine 118/76 and EOB 108/84 with mild dizziness     Exercises     Shoulder Instructions      Home Living Family/patient expects to be discharged to:: Private residence Living Arrangements: Spouse/significant other Available Help at Discharge: Family;Available 24 hours/day Type of Home: House Home Access: Level entry     Home Layout: Two level;Bed/bath upstairs Alternate Level Stairs-Number of Steps: flight, 16 steps Alternate Level Stairs-Rails: Right Bathroom Shower/Tub: Tub/shower unit;Curtain   Bathroom Toilet: Standard Bathroom Accessibility: Yes   Home Equipment: Grab bars - toilet;Walker - 2 wheels          Prior Functioning/Environment Level of Independence: Independent        Comments: reports independent and driving         OT Problem List: Decreased strength;Decreased activity tolerance;Pain;Cardiopulmonary status limiting activity      OT Treatment/Interventions: Self-care/ADL training;Therapeutic exercise;Energy conservation;DME and/or AE instruction;Therapeutic activities;Patient/family education    OT Goals(Current goals can be found in the care plan section) Acute Rehab OT Goals Patient Stated Goal: to feel better OT Goal Formulation: With patient Time For Goal Achievement: 06/23/19 Potential to Achieve Goals: Good  OT Frequency: Min 2X/week   Barriers to D/C:            Co-evaluation              AM-PAC OT "6 Clicks" Daily Activity     Outcome Measure Help from another person eating meals?: A Little Help from another person taking care of personal grooming?: A Little Help from another person toileting, which includes using toliet, bedpan, or urinal?: Total Help from another person bathing (including washing, rinsing, drying)?: A Lot Help from another person to put on and taking off  regular upper body clothing?: A Little Help from another person to put on and taking off regular lower body clothing?: A Lot 6 Click Score: 14   End of Session Equipment Utilized During Treatment: Oxygen(15L HFNC) Nurse Communication: Mobility status  Activity Tolerance: Patient tolerated treatment well Patient left: in bed;with call bell/phone within reach;with family/visitor present;Other (comment)(MD in room)  OT Visit Diagnosis: Muscle weakness (generalized) (M62.81);Pain;Other abnormalities of gait and mobility (R26.89) Pain - part of body: (buttocks)                Time: 2060-1561 OT Time Calculation (min): 19 min Charges:  OT General Charges $OT Visit: 1 Visit OT Evaluation $OT Eval Moderate Complexity: [place], OT Acute Rehabilitation Services Pager [phone] Office [phone]   Delight Stare 06/09/2019, 3:31 PM

## 2019-06-09 NOTE — Discharge Instructions (Signed)

## 2019-06-09 NOTE — Progress Notes (Signed)
CBG 67 this morning patient wanted orange juice and follow up CBG is 97.

## 2019-06-09 NOTE — Progress Notes (Signed)
OT Cancellation Note  Patient Details Name: Dylan Goodman MRN: [id number] DOB: [birth year]   Cancelled Treatment:    Reason Eval/Treat Not Completed: Patient declined, no reason specified- pt declined reports needing to get some rest, requested OT to return later.  Will follow and see as able.   Delight Stare, OT Acute Rehabilitation Services Pager [phone] Office [phone]   Delight Stare 06/09/2019, 8:56 AM

## 2019-06-09 NOTE — Progress Notes (Signed)
Calcium this morning is 6.1. I notified Cardiology MD and waiting for return call . I will continue to monitor.

## 2019-06-09 NOTE — Progress Notes (Signed)
PROGRESS NOTE    Dylan Goodman  UXN:[id number] DOB: May 05, 1951 DOA: 06/23/2019 PCP: Steele Sizer, MD   Brief Narrative:  Patient is a 68 year old male with history of  atrial fibrillation, GI bleed, coronary artery disease status post CABG, nasopharyngeal cancer, cardiomyopathy, chronic depression, CKD stage 4, diabetes mellitus, GI bleed , peripheral vascular disease, previous smoker, recent Covid-19 infection who was sent from inpatient rehab for the evaluation of acute respiratory distress.  He was just discharged from here to inpatient rehab yesterday.  He developed respiratory distress overnight and had to be kept on BiPAP.  He was admitted for the management of acute on chronic CHF exacerbation.he was also noted to have peripheral edema, diffuse Rales.  Chest x-ray concerning for pulmonary edema.  Patient has also been seen by nephrology.  Started on Lasix 80 mg IV three times a day . CT chest without contrast  showed possible multifocal pneumonia so he has been started on antibiotics.  He is also being followed by cardiology for new finding of worsening systolic heart failure and A. fib with RVR.Plan for cardioversion . He is currently on high flow oxygen.   Assessment & Plan:   Principal Problem:   Respiratory failure, acute ([place]) Active Problems:   CKD (chronic kidney disease), stage IV (HCC)   Coronary artery disease involving coronary bypass graft of native heart   IDA (iron deficiency anemia)   Type 2 diabetes mellitus with stage 4 chronic kidney disease, with long-term current use of insulin (HCC)   Metabolic acidosis   Atrial fibrillation with RVR (HCC)   Seizure (HCC)   Acute on chronic systolic (congestive) heart failure (HCC)   Acute on chronic respiratory failure: Secondary to multifocal pneumonia, pulmonary edema. CT chest showed :Persistent mixed interstitial airspace process bilaterally most prominent over the upper lobes with interval improvement in  airspace density over the right middle lobe. Findings are concerning for  ongoing infection. Also showed table small left effusion and worsening moderate size right pleural effusion. Started on  on vancomycin and cefepime.  Also underwent  US guided thoracentesis of the right sided moderate pleural effusion with removal of 700 ml of clear fluid.  Continue Lasix as per nephrology.ABG on 06/07/2019 showed normal CO2 but decreased oxygenation. He was intubated for airway protection on last admission when he presented with seizures. Currently he is on oxygen at 15 L/min.  Acute on chronic systolic congestive heart failure: Echo done on this admission showed ejection fraction to 20-25%.  Diffuse hypokinesis, severely reduced left ventricular function. echocardiogram done on 05/08/2019 had shown ejection fraction of 40- 45% . Cardiology following.  Continue diuresis and he is having good urine output.  A. fib with RVR: Hospital course remarkable for Afib with RVR  .  Cardiology following.  On Eliquis at home for anticoagulation .  Anticoagulation changed to Xarelto.  Cardiology planning for DCCV.   Heart rate was controlled this morning.  On amiodarone drip.  Also on rate control with metoprolol.  AKI on CKD stage 4:  Baseline creatinine around 2.5-3.  Nephrology following.  Continue diuresis  Seizure:  Continue Keppra.  Follow-up with neurology as an outpatient.  Insulin-dependent diabetic mellitus: Hemoglobin A1c of 6.7 in March 10, 2019.  On insulin at home.  Continue current insulin regimen.  Chronic hypotension: Suspected to be from adrenal insufficiency.  Currently on midodrine.Also on tapering steroids.  Hypocalcemia/hypokalemia: Being supplemented  Urinary retention: Has Foley catheter.  Needs to follow-up with urology as an outpatient.  Debility/deconditioning: Was just discharged to rehab.  PT/OT following        DVT prophylaxis: Xarelto Code Status: Full Family Communication:  Discussed with wife on phone on 06/07/19  Disposition Plan: Undetermined at this point.  Back to back to CIR when medically stable   Consultants: Cardiology, nephrology  Procedures: None  Antimicrobials:  Anti-infectives (From admission, onward)   Start     Dose/Rate Route Frequency Ordered Stop   06/08/19 2156  ceFEPIme (MAXIPIME) 2 g in sodium chloride 0.9 % 100 mL IVPB     2 g 200 mL/hr over 30 Minutes Intravenous Every 24 hours 06/08/19 0836     06/08/19 1000  vancomycin (VANCOCIN) IVPB 1000 mg/200 mL premix     1,000 mg 200 mL/hr over 60 Minutes Intravenous Every 24 hours 06/07/19 1109     06/07/19 1115  ceFEPIme (MAXIPIME) 2 g in sodium chloride 0.9 % 100 mL IVPB  Status:  Discontinued     2 g 200 mL/hr over 30 Minutes Intravenous Every 12 hours 06/07/19 1109 06/08/19 0836   06/07/19 1115  vancomycin (VANCOCIN) 1,500 mg in sodium chloride 0.9 % 500 mL IVPB     1,500 mg 250 mL/hr over 120 Minutes Intravenous  Once 06/07/19 1109 06/07/19 1412      Subjective:  Patient seen and examined the bedside this morning.  Heart rate is well controlled.  He is on 15 L of oxygen per minute.  He looks short of breath and weak but says he looks  slightly better today.  He is having good urine output.  I could hardly hear any basilar crackles today.  Objective: Vitals:   06/09/19 0438 06/09/19 0648 06/09/19 0724 06/09/19 0857  BP:   123/74   Pulse: 81  77 73  Resp: 20  (!) 34 14  Temp:   (!) 97.5 F (36.4 C)   TempSrc:   Axillary   SpO2: 91%  94% 95%  Weight:  86.6 kg    Height:        Intake/Output Summary (Last 24 hours) at 06/09/2019 1132 Last data filed at 06/09/2019 0900 Gross per 24 hour  Intake 509.4 ml  Output 3501 ml  Net -2991.6 ml   Filed Weights   06/07/19 0300 06/08/19 0500 06/09/19 0648  Weight: 89.8 kg 83.5 kg 86.6 kg    Examination:  General exam: In Mild respiratory distress, deconditioned, debilitated HEENT:PERRL,Oral mucosa moist, Ear/Nose normal on  gross exam Respiratory system: Bilateral decreased air entry Cardiovascular system: A. fib. No JVD, murmurs, rubs, gallops or clicks.  Trace pedal edema. Gastrointestinal system: Abdomen is nondistended, soft and nontender. No organomegaly or masses felt. Normal bowel sounds heard. Central nervous system: Alert and oriented. No focal neurological deficits. Extremities: Trace edema, no clubbing ,no cyanosis, distal peripheral pulses palpable. Skin: No rashes, lesions or ulcers,no icterus ,no pallor     Data Reviewed: I have personally reviewed following labs and imaging studies  CBC: Recent Labs  Lab 06/03/19 0016 06/04/19 0337 06/05/19 0349 06/13/2019 0501 06/07/19 0253  WBC 8.0 8.1 8.0 13.4* 9.3  NEUTROABS  --   --   --  10.0* 7.5  HGB 10.3* 10.6* 10.5* 11.7* 11.4*  HCT 33.5* 35.1* 34.6* 40.8 37.2*  MCV 99.7 100.9* 103.0* 107.1* 103.6*  PLT 89* 108* 127* 185 696   Basic Metabolic Panel: Recent Labs  Lab 06/04/19 0337 06/05/19 0349 06/17/2019 0501 06/07/19 0253 06/08/19 0340 06/09/19 0420  NA 142 144 143 141 142 141  K  3.9 4.2 4.8 4.4 3.4* 3.5  CL 110 115* 114* 106 107 104  CO2 21* 19* 18* 18* 22 25  GLUCOSE 134* 140* 196* 365* 136* 74  BUN 53* 49* 47* 51* 59* 64*  CREATININE 2.28* 2.26* 2.51* 2.36* 2.71* 2.73*  CALCIUM 7.0* 6.8* 6.9* 6.3* 6.4* 6.1*  PHOS 3.3  --   --   --   --   --    GFR: Estimated Creatinine Clearance: 28.7 mL/min (A) (by C-G formula based on SCr of 2.73 mg/dL (H)). Liver Function Tests: Recent Labs  Lab 06/24/2019 0501  AST 47*  ALT 43  ALKPHOS 269*  BILITOT 1.3*  PROT 4.9*  ALBUMIN 2.5*   No results for input(s): LIPASE, AMYLASE in the last 168 hours. No results for input(s): AMMONIA in the last 168 hours. Coagulation Profile: No results for input(s): INR, PROTIME in the last 168 hours. Cardiac Enzymes: No results for input(s): CKTOTAL, CKMB, CKMBINDEX, TROPONINI in the last 168 hours. BNP (last 3 results) No results for input(s):  PROBNP in the last 8760 hours. HbA1C: No results for input(s): HGBA1C in the last 72 hours. CBG: Recent Labs  Lab 06/08/19 1650 06/08/19 2153 06/08/19 2243 06/09/19 0619 06/09/19 0722  GLUCAP 121* 68* 122* 67* 92   Lipid Profile: No results for input(s): CHOL, HDL, LDLCALC, TRIG, CHOLHDL, LDLDIRECT in the last 72 hours. Thyroid Function Tests: No results for input(s): TSH, T4TOTAL, FREET4, T3FREE, THYROIDAB in the last 72 hours. Anemia Panel: No results for input(s): VITAMINB12, FOLATE, FERRITIN, TIBC, IRON, RETICCTPCT in the last 72 hours. Sepsis Labs: Recent Labs  Lab 06/25/2019 0501 06/29/2019 0930 06/07/19 0253  PROCALCITON 0.17  --  2.10  LATICACIDVEN 2.9* 1.8  --     Recent Results (from the past 240 hour(s))  Urine culture     Status: Abnormal   Collection Time: 06/05/19  5:45 PM   Specimen: Urine, Catheterized  Result Value Ref Range Status   Specimen Description URINE, CATHETERIZED  Final   Special Requests   Final    PATIENT ON FOLLOWING ZYVOX Performed at [facility], [place] [address], [place], [place] [address]    Culture 80,000 COLONIES/mL YEAST (A)  Final   Report Status 06/14/2019 FINAL  Final  Gram stain     Status: None   Collection Time: 06/07/19 11:22 AM   Specimen: Lung, Right; Pleural Fluid  Result Value Ref Range Status   Specimen Description PLEURAL RIGHT  Final   Special Requests NONE  Final   Gram Stain   Final    FEW WBC PRESENT,BOTH PMN AND MONONUCLEAR NO ORGANISMS SEEN Performed at [facility], [place] [address], [place], [place] [address]    Report Status 06/07/2019 FINAL  Final  Culture, body fluid-bottle     Status: None (Preliminary result)   Collection Time: 06/07/19 11:23 AM   Specimen: Pleura  Result Value Ref Range Status   Specimen Description PLEURAL RIGHT  Final   Special Requests NONE  Final   Culture   Final    NO GROWTH 2 DAYS Performed at [place] [place] [address] [place], [place] [address]     Report Status PENDING  Incomplete         Radiology Studies: Dg Chest 1 View  Result Date: 06/07/2019 CLINICAL DATA:  Pleural effusion, status post thoracentesis EXAM: CHEST  1 VIEW COMPARISON:  06/20/2019 FINDINGS: No significant change in appearance of the chest status post thoracentesis with diffuse bilateral  interstitial airspace opacity, left pleural thickening and loculated pleural fluid, and a layering right pleural effusion not appreciably changed compared to prior radiographic appearance. No pneumothorax. No new airspace opacity. Cardiomegaly status post median sternotomy. IMPRESSION: 1. No significant change in appearance of the chest status post thoracentesis with diffuse bilateral interstitial airspace opacity, left pleural thickening and loculated pleural fluid, and a layering right pleural effusion not appreciably changed compared to prior radiographic appearance. 2.  No pneumothorax. 3.  No new airspace opacity. Electronically Signed   By: Eddie Candle M.D.   On: 06/07/2019 13:04        Scheduled Meds: . calcitRIOL  0.25 mcg Oral Daily  . Chlorhexidine Gluconate Cloth  6 each Topical Daily  . furosemide  80 mg Intravenous Q8H  . Gerhardt's butt cream   Topical QID  . guaiFENesin  600 mg Oral BID  . insulin aspart  0-5 Units Subcutaneous QHS  . insulin aspart  0-9 Units Subcutaneous TID WC  . insulin glargine  25 Units Subcutaneous QHS  . ipratropium-albuterol  3 mL Inhalation BID  . lactobacillus acidophilus  2 tablet Oral TID  . levETIRAcetam  500 mg Oral BID  . Melatonin  6 mg Oral QHS  . metoprolol tartrate  50 mg Oral Q6H  . midodrine  5 mg Oral TID WC  . mometasone-formoterol  2 puff Inhalation BID  . pantoprazole  40 mg Oral Daily  . polyethylene glycol  17 g Oral Daily  . [START ON 06/17/2019] predniSONE  10 mg Oral Q breakfast  . [START ON 06/14/2019] predniSONE  20 mg Oral Q breakfast  . [START ON 06/11/2019] predniSONE  30 mg Oral Q breakfast  .  predniSONE  40 mg Oral Q breakfast  . [START ON 06/20/2019] predniSONE  5 mg Oral Q breakfast  . rivaroxaban  15 mg Oral Q supper  . sodium bicarbonate  1,300 mg Oral BID  . sodium chloride flush  10-40 mL Intracatheter Q12H  . sodium chloride flush  3 mL Intravenous Q12H  . traZODone  50 mg Oral QHS   Continuous Infusions: . sodium chloride 10 mL/hr at 06/08/19 0630  . amiodarone 30 mg/hr (06/09/19 0842)  . ceFEPime (MAXIPIME) IV 2 g (06/08/19 2219)  . vancomycin 1,000 mg (06/09/19 0810)     LOS: 3 days    Time spent: 35 mins.More than 50% of that time was spent in counseling and/or coordination of care.      Shelly Coss, MD [facility] Pager [phone]  If 7PM-7AM, please contact night-coverage [email or web address] Password [email or web address] 06/09/2019, 11:32 AM

## 2019-06-09 NOTE — Plan of Care (Signed)

## 2019-06-09 NOTE — Progress Notes (Signed)
[facility] NEPHROLOGY PROGRESS NOTE  Assessment/ Plan: Pt is a 68 y.o. yo male male with CHF, A. fib, DM, CKD with baseline creatinine around 2 admitted to [facility] from 9/14-9/19, readmitted on 10/6 status post cardiac arrest and suffered AKI on CKD required CRRT till 10/29.  #AKI on CKD, baseline creatinine around 2.  AKI likely ATN after cardiac arrest and concomitant with COVID-19 infection.  Initially treated with Lasix and then started CRRT from 10/24-10/29. -Patient had renal recovery therefore the HD catheter was discontinued on 11/6.   -Tolerating diuretics, good urine output, stable GFR, continue 3 times daily Lasix today  #Hypokalemia: Improved.  Stable  #Metabolic acidosis: Serum Bicarbonate stable, continue to monitor.    #Acute hypoxic respiratory failure/pneumonia due to COVID-19 virus: Completed course of remdesivir and steroid.  Initially clinically improved.  Slowly improving  #Acute on chronic systolic CHF exacerbation: IV diuretics as above.  Cardiology is following.  LVEF 20 to 25%.  #Hypotension/possible adrenal insufficiency: On  steroid and midodrine.  Blood pressure is improved.  #ESBL UTI possible acute urinary retention: Antibiotics per primary service urology following for some blood clot during urination.  Need outpatient follow-up with urology per intraurethral catheter.  Subjective:   Stable on HFNC  3.5 L urine output yesterday, with Foley catheter, weights not entirely accurate  Creatinine stable at 2.7, K3.5, bicarbonate 25  Objective Vital signs in last 24 hours: Vitals:   06/09/19 0438 06/09/19 0648 06/09/19 0724 06/09/19 0857  BP:   123/74   Pulse: 81  77 73  Resp: 20  (!) 34 14  Temp:   (!) 97.5 F (36.4 C)   TempSrc:   Axillary   SpO2: 91%  94% 95%  Weight:  86.6 kg    Height:       Weight change: 3.175 kg  Intake/Output Summary (Last 24 hours) at 06/09/2019 0939 Last data filed at 06/09/2019  0900 Gross per 24 hour  Intake 509.4 ml  Output 3501 ml  Net -2991.6 ml       Labs: Basic Metabolic Panel: Recent Labs  Lab 06/04/19 0337  06/07/19 0253 06/08/19 0340 06/09/19 0420  NA 142   < > 141 142 141  K 3.9   < > 4.4 3.4* 3.5  CL 110   < > 106 107 104  CO2 21*   < > 18* 22 25  GLUCOSE 134*   < > 365* 136* 74  BUN 53*   < > 51* 59* 64*  CREATININE 2.28*   < > 2.36* 2.71* 2.73*  CALCIUM 7.0*   < > 6.3* 6.4* 6.1*  PHOS 3.3  --   --   --   --    < > = values in this interval not displayed.   Liver Function Tests: Recent Labs  Lab 06/18/2019 0501  AST 47*  ALT 43  ALKPHOS 269*  BILITOT 1.3*  PROT 4.9*  ALBUMIN 2.5*   No results for input(s): LIPASE, AMYLASE in the last 168 hours. No results for input(s): AMMONIA in the last 168 hours. CBC: Recent Labs  Lab 06/03/19 0016 06/04/19 0337 06/05/19 0349 06/16/2019 0501 06/07/19 0253  WBC 8.0 8.1 8.0 13.4* 9.3  NEUTROABS  --   --   --  10.0* 7.5  HGB 10.3* 10.6* 10.5* 11.7* 11.4*  HCT 33.5* 35.1* 34.6* 40.8 37.2*  MCV 99.7 100.9* 103.0* 107.1* 103.6*  PLT 89* 108* 127* 185 157   Cardiac Enzymes: No results for input(s):  CKTOTAL, CKMB, CKMBINDEX, TROPONINI in the last 168 hours. CBG: Recent Labs  Lab 06/08/19 1650 06/08/19 2153 06/08/19 2243 06/09/19 0619 06/09/19 0722  GLUCAP 121* 68* 122* 67* 92    Iron Studies: No results for input(s): IRON, TIBC, TRANSFERRIN, FERRITIN in the last 72 hours. Studies/Results: Dg Chest 1 View  Result Date: 06/07/2019 CLINICAL DATA:  Pleural effusion, status post thoracentesis EXAM: CHEST  1 VIEW COMPARISON:  06/28/2019 FINDINGS: No significant change in appearance of the chest status post thoracentesis with diffuse bilateral interstitial airspace opacity, left pleural thickening and loculated pleural fluid, and a layering right pleural effusion not appreciably changed compared to prior radiographic appearance. No pneumothorax. No new airspace opacity. Cardiomegaly  status post median sternotomy. IMPRESSION: 1. No significant change in appearance of the chest status post thoracentesis with diffuse bilateral interstitial airspace opacity, left pleural thickening and loculated pleural fluid, and a layering right pleural effusion not appreciably changed compared to prior radiographic appearance. 2.  No pneumothorax. 3.  No new airspace opacity. Electronically Signed   By: Eddie Candle M.D.   On: 06/07/2019 13:04   US Thoracentesis Asp Pleural Space W/img Guide  Result Date: 06/07/2019 INDICATION: Acute on chronic congestive heart failure. Right pleural effusion. Request for diagnostic therapeutic thoracentesis. EXAM: ULTRASOUND GUIDED RIGHT THORACENTESIS MEDICATIONS: 1% lidocaine 10 mL COMPLICATIONS: None immediate. PROCEDURE: An ultrasound guided thoracentesis was thoroughly discussed with the patient and questions answered. The benefits, risks, alternatives and complications were also discussed. The patient understands and wishes to proceed with the procedure. Written consent was obtained. Ultrasound was performed to localize and mark an adequate pocket of fluid in the right chest. The area was then prepped and draped in the normal sterile fashion. 1% Lidocaine was used for local anesthesia. Under ultrasound guidance a 6 Fr Safe-T-Centesis catheter was introduced. Thoracentesis was performed. The catheter was removed and a dressing applied. FINDINGS: A total of approximately 700 mL of clear yellow fluid was removed. Samples were sent to the laboratory as requested by the clinical team. IMPRESSION: Successful ultrasound guided right thoracentesis yielding 700 mL of pleural fluid. No pneumothorax on post-procedure chest x-ray. Read by: Gareth Eagle, PA-C Electronically Signed   By: Lucrezia Europe M.D.   On: 06/07/2019 13:07    Medications: Infusions: . sodium chloride 10 mL/hr at 06/08/19 0630  . amiodarone 30 mg/hr (06/09/19 0842)  . calcium gluconate    . ceFEPime  (MAXIPIME) IV 2 g (06/08/19 2219)  . vancomycin 1,000 mg (06/09/19 0810)    Scheduled Medications: . calcitRIOL  0.25 mcg Oral Daily  . Chlorhexidine Gluconate Cloth  6 each Topical Daily  . furosemide  80 mg Intravenous Q8H  . Gerhardt's butt cream   Topical QID  . guaiFENesin  600 mg Oral BID  . insulin aspart  0-5 Units Subcutaneous QHS  . insulin aspart  0-9 Units Subcutaneous TID WC  . insulin glargine  25 Units Subcutaneous QHS  . ipratropium-albuterol  3 mL Inhalation BID  . lactobacillus acidophilus  2 tablet Oral TID  . levETIRAcetam  500 mg Oral BID  . Melatonin  6 mg Oral QHS  . metoprolol tartrate  50 mg Oral Q6H  . midodrine  5 mg Oral TID WC  . mometasone-formoterol  2 puff Inhalation BID  . pantoprazole  40 mg Oral Daily  . polyethylene glycol  17 g Oral Daily  . [START ON 06/17/2019] predniSONE  10 mg Oral Q breakfast  . [START ON 06/14/2019] predniSONE  20 mg  Oral Q breakfast  . [START ON 06/11/2019] predniSONE  30 mg Oral Q breakfast  . predniSONE  40 mg Oral Q breakfast  . [START ON 06/20/2019] predniSONE  5 mg Oral Q breakfast  . rivaroxaban  15 mg Oral Q supper  . sodium bicarbonate  1,300 mg Oral BID  . sodium chloride flush  10-40 mL Intracatheter Q12H  . sodium chloride flush  3 mL Intravenous Q12H  . traZODone  50 mg Oral QHS    have reviewed scheduled and prn medications.  Physical Exam: General: On nasal cannula, not in distress Heart:RRR, s1s2 nl, no rub Lungs: Bibasal crackles, no wheezing Abdomen:soft, Non-tender, non-distended Extremities: Trace lower extremity edema Dialysis Access: No HD catheter present  Demika Langenderfer B Rilyn Scroggs 06/09/2019,9:39 AM  LOS: 3 days

## 2019-06-09 NOTE — Progress Notes (Addendum)
Pt c/o feeling like he needs to void but unable to. Pt has voided 250cc earlier after foley removed. Bladder scanned for 488cc. Paged MD. New orders to in and out prn

## 2019-06-09 NOTE — Progress Notes (Signed)
Progress Note  Patient Name: Dylan Goodman Date of Encounter: 06/09/2019  Primary Cardiologist: Corey Skains, MD   Subjective   Tired but no chest pain. Feels breathing is ok, though saturating around 90% in bed on nasal cannula. We discussed options for management, see below. He would like to hold on TEE-CV for now.  Inpatient Medications    Scheduled Meds: . [START ON 06/10/2019] amiodarone  400 mg Oral BID  . calcitRIOL  0.25 mcg Oral Daily  . Chlorhexidine Gluconate Cloth  6 each Topical Daily  . furosemide  80 mg Intravenous Q8H  . Gerhardt's butt cream   Topical QID  . guaiFENesin  600 mg Oral BID  . insulin aspart  0-5 Units Subcutaneous QHS  . insulin aspart  0-9 Units Subcutaneous TID WC  . insulin glargine  25 Units Subcutaneous QHS  . ipratropium-albuterol  3 mL Inhalation BID  . lactobacillus acidophilus  2 tablet Oral TID  . levETIRAcetam  500 mg Oral BID  . Melatonin  6 mg Oral QHS  . metoprolol tartrate  50 mg Oral Q6H  . midodrine  5 mg Oral TID WC  . mometasone-formoterol  2 puff Inhalation BID  . pantoprazole  40 mg Oral Daily  . polyethylene glycol  17 g Oral Daily  . [START ON 06/17/2019] predniSONE  10 mg Oral Q breakfast  . [START ON 06/14/2019] predniSONE  20 mg Oral Q breakfast  . [START ON 06/11/2019] predniSONE  30 mg Oral Q breakfast  . predniSONE  40 mg Oral Q breakfast  . [START ON 06/20/2019] predniSONE  5 mg Oral Q breakfast  . rivaroxaban  15 mg Oral Q supper  . sodium bicarbonate  1,300 mg Oral BID  . sodium chloride flush  10-40 mL Intracatheter Q12H  . sodium chloride flush  3 mL Intravenous Q12H  . traZODone  50 mg Oral QHS   Continuous Infusions: . sodium chloride 10 mL/hr at 06/08/19 0630  . amiodarone 30 mg/hr (06/09/19 0842)  . ceFEPime (MAXIPIME) IV 2 g (06/08/19 2219)  . vancomycin 1,000 mg (06/09/19 0810)   PRN Meds: sodium chloride, acetaminophen, LORazepam, ondansetron (ZOFRAN) IV, sodium chloride flush,  sodium chloride flush   Vital Signs    Vitals:   06/09/19 0648 06/09/19 0724 06/09/19 0857 06/09/19 1137  BP:  123/74  111/89  Pulse:  77 73 83  Resp:  (!) 34 14 (!) 29  Temp:  (!) 97.5 F (36.4 C)  (!) 97.5 F (36.4 C)  TempSrc:  Axillary  Oral  SpO2:  94% 95% 91%  Weight: 86.6 kg     Height:        Intake/Output Summary (Last 24 hours) at 06/09/2019 1540 Last data filed at 06/09/2019 1200 Gross per 24 hour  Intake 559.4 ml  Output 2801 ml  Net -2241.6 ml   Last 3 Weights 06/09/2019 06/08/2019 06/07/2019  Weight (lbs) 191 lb 184 lb 198 lb  Weight (kg) 86.637 kg 83.462 kg 89.812 kg      Telemetry    Atrial fibrillation, rate controlled- Personally Reviewed  ECG    06/24/2019 afib RVR with nonspecific ST changed - Personally Reviewed  Physical Exam   GEN: frail appearing but in no acute distress HEENT: Normal, moist mucous membranes NECK: JVD to mid neck at 45 degrees (improved) CARDIAC: irregularly irregular rhythm, normal S1 and S2, no rubs or gallops. No murmurs. VASCULAR: Radial and DP pulses 2+ bilaterally. No carotid bruits RESPIRATORY:  Distant  breath sounds, coarse, bilateral rales ABDOMEN: Soft, non-tender, non-distended MUSCULOSKELETAL:  Ambulates independently SKIN: Warm and dry, no significant edema NEUROLOGIC:  Alert and oriented x 3. No focal neuro deficits noted. PSYCHIATRIC:  Normal affect   Labs    High Sensitivity Troponin:   Recent Labs  Lab 05/24/19 1630 05/24/19 1845 05/26/19 0650 05/27/19 0615  TROPONINIHS 261* 774* 2,883* 1,611*      Chemistry Recent Labs  Lab 06/29/2019 0501 06/07/19 0253 06/08/19 0340 06/09/19 0420  NA 143 141 142 141  K 4.8 4.4 3.4* 3.5  CL 114* 106 107 104  CO2 18* 18* 22 25  GLUCOSE 196* 365* 136* 74  BUN 47* 51* 59* 64*  CREATININE 2.51* 2.36* 2.71* 2.73*  CALCIUM 6.9* 6.3* 6.4* 6.1*  PROT 4.9*  --   --   --   ALBUMIN 2.5*  --   --   --   AST 47*  --   --   --   ALT 43  --   --   --   ALKPHOS 269*   --   --   --   BILITOT 1.3*  --   --   --   GFRNONAA 26* 28* 24* 23*  GFRAA 30* 32* 27* 27*  ANIONGAP 11 17* 13 12     Hematology Recent Labs  Lab 06/05/19 0349 06/14/2019 0501 06/07/19 0253  WBC 8.0 13.4* 9.3  RBC 3.36* 3.81* 3.59*  HGB 10.5* 11.7* 11.4*  HCT 34.6* 40.8 37.2*  MCV 103.0* 107.1* 103.6*  MCH 31.3 30.7 31.8  MCHC 30.3 28.7* 30.6  RDW 20.3* 20.2* 19.8*  PLT 127* 185 157    BNPNo results for input(s): BNP, PROBNP in the last 168 hours.   DDimer No results for input(s): DDIMER in the last 168 hours.   Radiology    No results found.  Cardiac Studies   Echo 06/14/2019 1. Left ventricular ejection fraction, by visual estimation, is 20 to 25%. The left ventricle has severely decreased function. Left ventricular septal wall thickness was normal. There is no left ventricular hypertrophy.  2. Left ventricular diastolic parameters are indeterminate.  3. Moderately dilated left ventricular internal cavity size.  4. Diffuse hypokinesis EF has decreased since echo done 05/08/19.  5. Global right ventricle has normal systolic function.The right ventricular size is normal. No increase in right ventricular wall thickness.  6. Left atrial size was severely dilated.  7. Right atrial size was normal.  8. The mitral valve is normal in structure. Mild mitral valve regurgitation.  9. The tricuspid valve is normal in structure. Tricuspid valve regurgitation moderate. 10. The aortic valve is tricuspid. Aortic valve regurgitation is mild. Mild to moderate aortic valve sclerosis/calcification without any evidence of aortic stenosis. 11. The pulmonic valve was grossly normal. Pulmonic valve regurgitation is mild. 12. Moderately elevated pulmonary artery systolic pressure. 13. The interatrial septum was not well visualized.  Patient Profile     68 y.o. male with a hx of CAD s/p CABG1996 and PCI (details unclear), persistent atrial fibrillation,chronic systolic CHF, PAD  (h/orevascularization stenting and fem-pop bypass grafting), carotid artery disease (chronic L ICAocclusion per notes), HTN, DM,chronic diarrhea, nasopharyngeal CA, anemia, gastric MHDQQ,IWL,NLGXQ-11 PNAwith complications, CKD stage IV (recently requiring CRRT)who is being followed for the evaluation of worsening LV functionat the request of Dr. Tawanna Solo. He has multiple recent hospitalizations, including for Covid 03/2019 and post cardiac arrest 10/6-10/29.  Assessment & Plan   Acute hypoxic respiratory failure, pleural effusions, possible multifocal pneumonia, acute  on chronic systolic and diastolic heart failure with EF 20-25%: -s/p thoracentesis; R thora with 700cc clear yellow fluid 11/8 -diuresis per nephrology given renal failure, currently dosing 80 mg IV furosemide q8 hours with metolazone PRN -Cr elevated from recent nadir 2.28, stable at 2.73 today -has required BiPAP, today on [place]. JVD slightly improved today but lungs still very coarse -on antibiotics for HCAP per primary team -worsening EF likely 2/2 afib RVR -most recent Covid swab negative -net negative 5.4 L per measured amounts, but weight trend variable (89.8 kf -> 83.5 kg -> 86.6 kg)  Atrial fibrillation with RVR:  -on rivaroxaban, tolerating. Dose is 15 mg given renal disease; if CrCl drops to <15, will need to adjust therapy. Started 11/7. Per consult note 11/7, would need full dose apixaban but reduced dose rivaroxaban, hence the change. -we discussed medical management vs. TEE-CV. He would like to avoid procedure if possible. Will plan to continue IV amiodarone until tomorrow AM, then switch to oral. Encouraged him to exert himself as much as he is able with PT tomorrow to determine if his heart rate will stay well controlled.  -severely dilated LA. Unclear whether sinus rhythm will hold, but with low EF and volume overload, discussed TEE-CV for attempt at sinus rhythm. He would like to defer for now but if his heart  rate is uncontrolled with activity, he would be willing to readdress. -on amiodarone drip as well as metoprolol 50 mg q6 hours. Continue -calcium being repleted, follow K/Mg  CAD, with prior CABG: no acute complaints for ischemia. With his multiple medical issues and severity of his current disease, would not check troponin unless he develops symptoms concerning for ACS.  Per primary/additional consulting teams: Acute on chronic kidney failure: baseline stage 4, required CRRT with recent admission Hypotension: chronic, ? 2/2 adrenal insufficiency, on midodrine and prednisone ESBL UTI with urinary retentions History of seiures, on Keppra Insulin-dependent type II diabetes  For questions or updates, please contact [place] Please consult [email or web address] for contact info under     Signed, Buford Dresser, MD  06/09/2019, 3:40 PM

## 2019-06-09 NOTE — Progress Notes (Signed)
In and out cath for 1100cc yellow urine

## 2019-06-09 NOTE — Discharge Summary (Signed)
Physician Discharge Summary  Patient ID: Dylan Goodman MRN: [id number] DOB/AGE: [birth year] 68 y.o.  Admit date: 06/05/2019 Discharge date: 06/05/2019  Discharge Diagnoses:  Principal Problem:   Acute respiratory failure with hypoxemia [facility]) Active Problems:   Benign essential HTN   Chronic diarrhea of unknown origin   CKD (chronic kidney disease), stage IV (HCC)   Critical illness myopathy   Acute on chronic systolic (congestive) heart failure [facility])   Discharged Condition: Critical.   Dg Chest Port 1 View  Result Date: 06/24/2019 CLINICAL DATA:  Respiratory distress. EXAM: PORTABLE CHEST 1 VIEW COMPARISON:  05/23/2019 FINDINGS: Sternotomy wires unchanged. Lungs are adequately inflated demonstrate slight interval worsening of diffuse bilateral mixed interstitial airspace density most prominent over the central lungs left greater than right. Findings may be due to asymmetric edema versus infection. Possible small amount left pleural fluid. No evidence of pneumothorax. Mild cardiomegaly. Remainder of the exam is unchanged. IMPRESSION: Mild cardiomegaly with mild interval worsening of bilateral mixed interstitial airspace process left worse than right. Findings may be due to asymmetric edema versus infection. Possible small amount left pleural fluid. Electronically Signed   By: Marin Olp M.D.   On: 06/09/2019 04:04     Labs:  Basic Metabolic Panel: Lab 22/97/98 0016 06/04/19 0337 06/05/19 0349  NA 137 142 144  K 2.8* 3.9 4.2  CL 106 110 115*  CO2 21* 21* 19*  GLUCOSE 192* 134* 140*  BUN 57* 53* 49*  CREATININE 2.42* 2.28* 2.26*  CALCIUM 7.0* 7.0* 6.8*  PHOS  --  3.3  --     CBC: Lab 06/05/19 0349  WBC 8.0  NEUTROABS  --   HGB 10.5*  HCT 34.6*  MCV 103.0*  PLT 127*     Brief HPI:   Dylan Goodman is a 68 y.o. male with history of A fib, T2DM, CKD, nasopharyngeal cancer, Menetrier disease,  PUD/GIB, CAD s/p CABG, COVID 28 (admitted [facility] 9/14-9/19) who  has had issues with dizziness/CP treated with IVF in ED on 10/5 and recurrent episode on 10/6. EMS activated and he had 2 minutes witnessed tonic clonic seizure that responded to versed. He was confused at admission to [facility] and COVID test positive. CT head negative for acute changes. Plans were to transfer patient to [facility] but he developed VT v/s Afib with RVR leading to respiratory arrest treated with defibrillation and CPR --down time 11 minutes and he was intubated and transferred to [facility] for management. He was started on broad spectrum antibiotics, amiodarone and BLE dopplers negative for DVT. He was loaded with Keppra and EEG done showing evidence of moderate diffuse encephalopathy with excessive beta activity likely due to benzo's. He tolerated extubation to BIPAP on 10/08.  Dr. Moshe Cipro consulted for acute on chronic renal failure with rise in SCr 5.17 and felt that AKI likely due to ATN in setting of current illness and cardiac arrest. Metabolic acidosis improved with IV bicarb. On 10/15, he developed hypoxia with increased WOB and was started on 30L/100%HFNC and treated with IV lasix as well as steroids due to concerns of COPD exacerbation.   Dr. Vaughan Browner expressed suspicion of Covid pneumonitis due to continued high flow needs.  CT chest done 10/19 revealing extensive patchy consolidation with ground glass opacity prominent in BUL suggestive of PNA, atelectasis or possible early post-inflammatory fibrosis. Incidental findings of moderate to severe centrilobar and paraseptal emphysema with suggestion of diffuse gastric wall thickening--unchanged.  Due to lack of improvement in symptoms, decision was made to  treat him with Remdesivir and antibiotics.  He developed progressive confusion with agitation and was intubated from 10/21 and transferred to [facility].   He developed recurrent acute on CRF with uremia and started on CRRT 10/25.  He did develop hematuria due to acute cystitis--heparin d/c and started on  Rocephin per Dr. Noland Fordyce input--> changed to meropenum and Linezolid for ESBL Klebsiella/VRE UTI.   He was transferred back to [facility] on 10/30. He has required midodrine as well as Solu Cortef for hypotension and possible adrenal insufficiency.  CRRT d/c 10/29 and volume status/renal status stable without uremic features. Patient had bladder distension with clots in urine and pelvic ultrasound 11/02 revealing mild left > right hydronephrosis therefore foley placed after discussion with GU due to concerns of urinary retention and decrease in UOP. He responded to fluid challenge with increase in output--HD . He did have lower abdominal/bladder pain affecting affecting mobility yesterday but feeling better today. Attempts to wean oxygen unsuccessful and he was placed back on oxygen due to hypoxia.  Patient cleared to start CIR program and he was admitted to rehab on 06/05/19. On early am of 11/07,  patient developed respiratory distress with CXR showing increase in pleural effusion on the left. He was placed on BIPAP due to increase in WOB and transferred to acute for work up and monitoring.     Medications at discharge: 1. Amiodarone 200 mg bid po  2. Eliquis 5 mg bid po 3. Urispas 100 mg tid po 4. Gerhardt's butt cream qid to sacrum. 5. Prednisone 50 mg taper by 10 mg every 3 days.  6. Lantus 12 units SQ at bedtime. 7. Bacid 2 tabs tid po  8. Keppra 500 mg bid po 9. Duonebs nebs bid 10. Midodrine 5 mg tid po 11. Dulera 200-5 2 puffs bid.  12. Protonix 40 mg po daily 13. Sodium bicarb 650 mg bid po  Disposition: To acute floor  Signed: Bary Goodman 06/09/2019, 9:59 PM

## 2019-06-10 ENCOUNTER — Inpatient Hospital Stay (HOSPITAL_COMMUNITY): Payer: Medicare Other

## 2019-06-10 DIAGNOSIS — J80 Acute respiratory distress syndrome: Secondary | ICD-10-CM

## 2019-06-10 DIAGNOSIS — U071 COVID-19: Secondary | ICD-10-CM

## 2019-06-10 DIAGNOSIS — J810 Acute pulmonary edema: Secondary | ICD-10-CM

## 2019-06-10 LAB — CBC WITH DIFFERENTIAL/PLATELET
Abs Immature Granulocytes: 0.04 10*3/uL (ref 0.00–0.07)
Basophils Absolute: 0 10*3/uL (ref 0.0–0.1)
Basophils Relative: 0 %
Eosinophils Absolute: 0 10*3/uL (ref 0.0–0.5)
Eosinophils Relative: 0 %
HCT: 35.2 % — ABNORMAL LOW (ref 39.0–52.0)
Hemoglobin: 11.4 g/dL — ABNORMAL LOW (ref 13.0–17.0)
Immature Granulocytes: 1 %
Lymphocytes Relative: 15 %
Lymphs Abs: 1.2 10*3/uL (ref 0.7–4.0)
MCH: 31.1 pg (ref 26.0–34.0)
MCHC: 32.4 g/dL (ref 30.0–36.0)
MCV: 96.2 fL (ref 80.0–100.0)
Monocytes Absolute: 0.6 10*3/uL (ref 0.1–1.0)
Monocytes Relative: 7 %
Neutro Abs: 6.1 10*3/uL (ref 1.7–7.7)
Neutrophils Relative %: 77 %
Platelets: 147 10*3/uL — ABNORMAL LOW (ref 150–400)
RBC: 3.66 MIL/uL — ABNORMAL LOW (ref 4.22–5.81)
RDW: 19 % — ABNORMAL HIGH (ref 11.5–15.5)
WBC: 8 10*3/uL (ref 4.0–10.5)
nRBC: 0 % (ref 0.0–0.2)

## 2019-06-10 LAB — BASIC METABOLIC PANEL
Anion gap: 14 (ref 5–15)
BUN: 67 mg/dL — ABNORMAL HIGH (ref 8–23)
CO2: 28 mmol/L (ref 22–32)
Calcium: 5.6 mg/dL — CL (ref 8.9–10.3)
Chloride: 94 mmol/L — ABNORMAL LOW (ref 98–111)
Creatinine, Ser: 2.78 mg/dL — ABNORMAL HIGH (ref 0.61–1.24)
Glucose, Bld: 130 mg/dL — ABNORMAL HIGH (ref 70–99)
Potassium: 5.3 mmol/L — ABNORMAL HIGH (ref 3.5–5.1)
Sodium: 136 mmol/L (ref 135–145)

## 2019-06-10 LAB — GLUCOSE, CAPILLARY
Glucose-Capillary: 117 mg/dL — ABNORMAL HIGH (ref 70–99)
Glucose-Capillary: 160 mg/dL — ABNORMAL HIGH (ref 70–99)
Glucose-Capillary: 251 mg/dL — ABNORMAL HIGH (ref 70–99)
Glucose-Capillary: 109 mg/dL — ABNORMAL HIGH (ref 70–99)

## 2019-06-10 IMAGING — DX DG CHEST 1V
1 series · 1 of 1 positions shown · non-contrast
Comparison: Chest radiograph dated [DATE].

CLINICAL DATA: 67-year-old male with acute respiratory failure.

EXAM:
CHEST  1 VIEW

[chest ap]
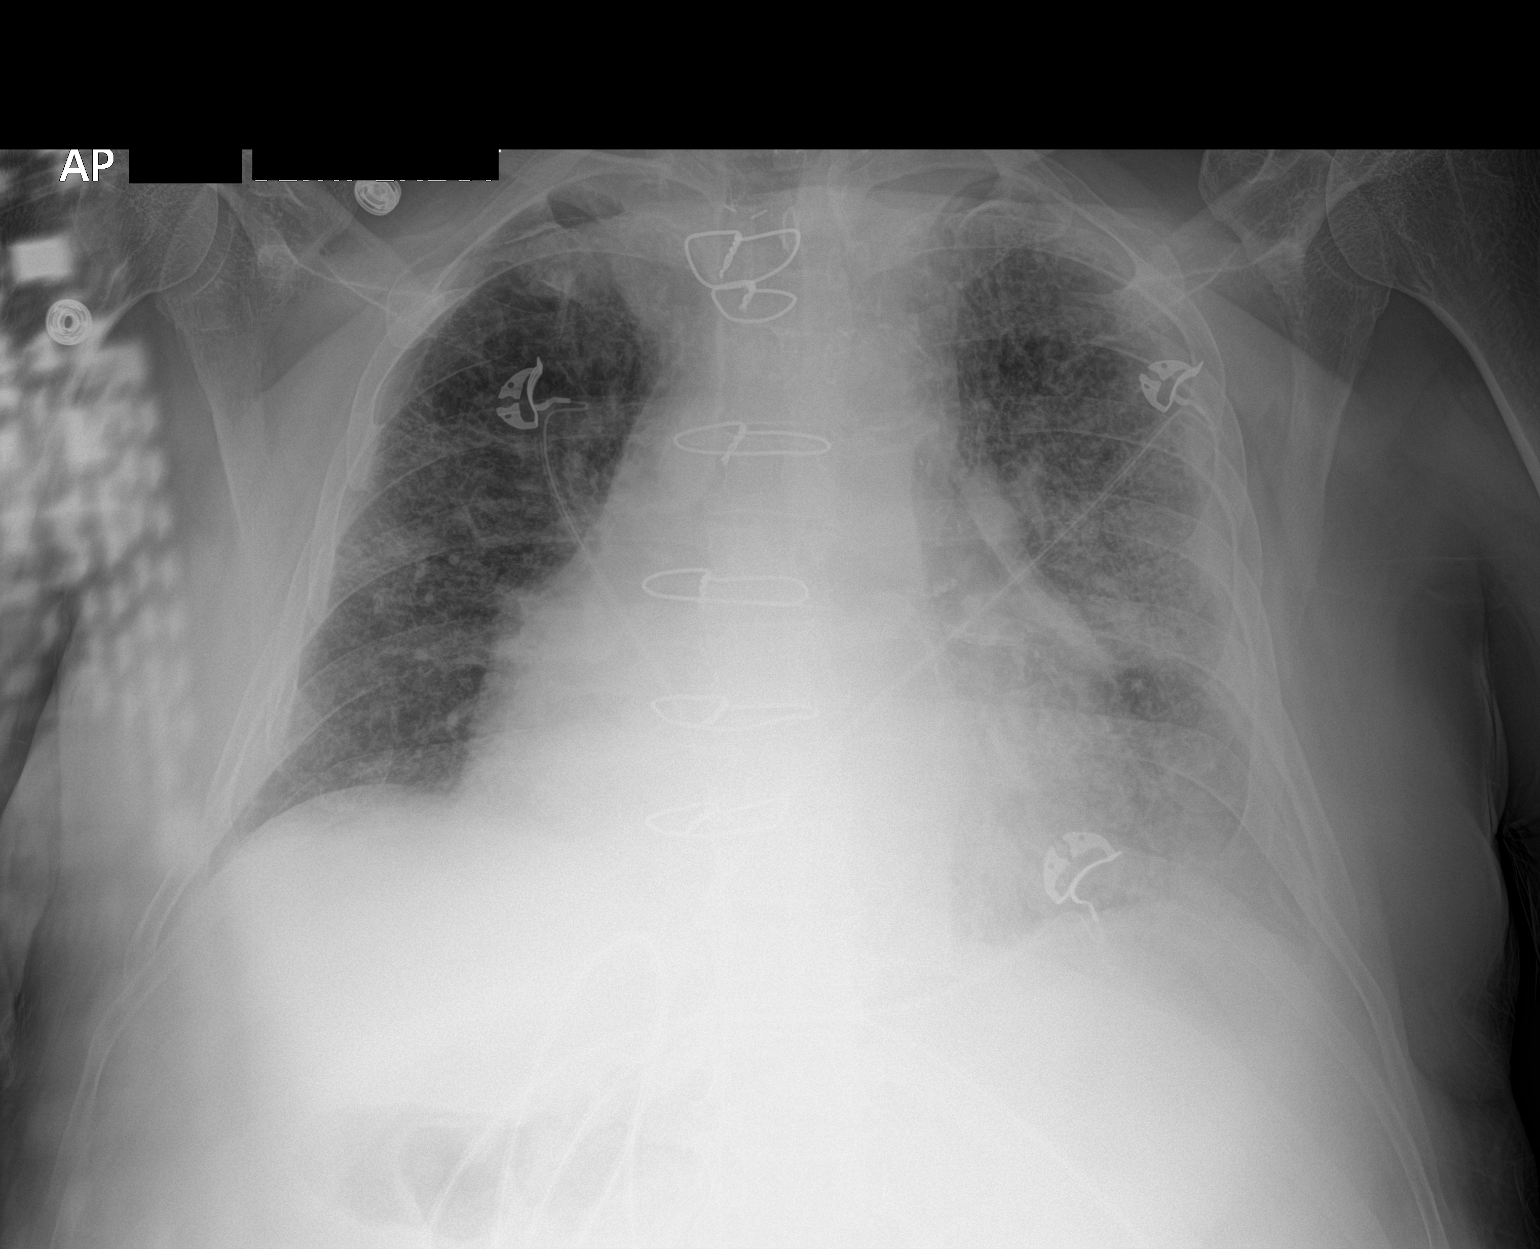

[1 of 1 positions shown; findings below may reference images not displayed]

FINDINGS: Small left pleural effusion or pleural thickening similar to prior
radiograph. There is diffuse interstitial coarsening and diffuse
hazy airspace density in the left lung. Right lung interstitial
coarsening. Overall no significant interval change in the appearance
of the lungs since the prior radiograph. A tiny right apical
pneumothorax may be present. Attention on follow-up imaging
recommended. Stable cardiomegaly. Median sternotomy wires. No acute
osseous pathology.
IMPRESSION: 1. No significant interval change in the appearance of the lungs
since the prior radiograph.
2. Stable small left pleural effusion or pleural thickening.
3. Possible tiny right apical pneumothorax. Attention on follow-up
imaging recommended.

## 2019-06-10 MED ORDER — LIDOCAINE HCL URETHRAL/MUCOSAL 2 % EX GEL
1.0000 "application " | Freq: Once | CUTANEOUS | Status: AC
  Administered 2019-06-10: 1 via URETHRAL
  Filled 2019-06-10: qty 20

## 2019-06-10 MED ORDER — ALUM & MAG HYDROXIDE-SIMETH 200-200-20 MG/5ML PO SUSP
30.0000 mL | ORAL | Status: DC | PRN
  Administered 2019-06-10 – 2019-06-11 (×2): 30 mL via ORAL
  Filled 2019-06-10 (×2): qty 30

## 2019-06-10 MED ORDER — ZOLPIDEM TARTRATE 5 MG PO TABS
5.0000 mg | ORAL_TABLET | Freq: Every evening | ORAL | Status: DC | PRN
  Administered 2019-06-10 – 2019-06-11 (×2): 5 mg via ORAL
  Filled 2019-06-10 (×2): qty 1

## 2019-06-10 MED ORDER — TAMSULOSIN HCL 0.4 MG PO CAPS
0.4000 mg | ORAL_CAPSULE | Freq: Every day | ORAL | Status: DC
  Administered 2019-06-10 – 2019-06-14 (×5): 0.4 mg via ORAL
  Filled 2019-06-10 (×6): qty 1

## 2019-06-10 MED ORDER — CALCIUM GLUCONATE-NACL 1-0.675 GM/50ML-% IV SOLN
1.0000 g | Freq: Once | INTRAVENOUS | Status: AC
  Administered 2019-06-10: 1000 mg via INTRAVENOUS
  Filled 2019-06-10: qty 50

## 2019-06-10 MED ORDER — PHENYLEPHRINE HCL 10 % OP SOLN
Freq: Once | OPHTHALMIC | Status: DC
  Filled 2019-06-10: qty 10

## 2019-06-10 NOTE — Plan of Care (Signed)
  Problem: Education: Goal: Knowledge of General Education information will improve Description: Including pain rating scale, medication(s)/side effects and non-pharmacologic comfort measures 06/10/2019 1242 by Shanon Ace, RN Outcome: Progressing 06/10/2019 1241 by Shanon Ace, RN Outcome: Progressing   Problem: Health Behavior/Discharge Planning: Goal: Ability to manage health-related needs will improve 06/10/2019 1242 by Shanon Ace, RN Outcome: Progressing 06/10/2019 1241 by Shanon Ace, RN Outcome: Progressing   Problem: Clinical Measurements: Goal: Ability to maintain clinical measurements within normal limits will improve 06/10/2019 1242 by Shanon Ace, RN Outcome: Progressing 06/10/2019 1241 by Shanon Ace, RN Outcome: Progressing Goal: Will remain free from infection 06/10/2019 1242 by Shanon Ace, RN Outcome: Progressing 06/10/2019 1241 by Shanon Ace, RN Outcome: Progressing Goal: Diagnostic test results will improve 06/10/2019 1242 by Shanon Ace, RN Outcome: Progressing 06/10/2019 1241 by Shanon Ace, RN Outcome: Progressing Goal: Respiratory complications will improve 06/10/2019 1242 by Shanon Ace, RN Outcome: Progressing 06/10/2019 1241 by Shanon Ace, RN Outcome: Progressing Goal: Cardiovascular complication will be avoided 06/10/2019 1242 by Shanon Ace, RN Outcome: Progressing 06/10/2019 1241 by Shanon Ace, RN Outcome: Progressing   Problem: Activity: Goal: Risk for activity intolerance will decrease 06/10/2019 1242 by Shanon Ace, RN Outcome: Progressing 06/10/2019 1241 by Shanon Ace, RN Outcome: Progressing   Problem: Nutrition: Goal: Adequate nutrition will be maintained 06/10/2019 1242 by Shanon Ace, RN Outcome: Progressing 06/10/2019 1241 by Shanon Ace, RN Outcome: Progressing   Problem: Coping: Goal: Level of anxiety will decrease 06/10/2019 1242 by Shanon Ace, RN Outcome:  Progressing 06/10/2019 1241 by Shanon Ace, RN Outcome: Progressing   Problem: Elimination: Goal: Will not experience complications related to bowel motility 06/10/2019 1242 by Shanon Ace, RN Outcome: Progressing 06/10/2019 1241 by Shanon Ace, RN Outcome: Progressing Goal: Will not experience complications related to urinary retention 06/10/2019 1242 by Shanon Ace, RN Outcome: Progressing 06/10/2019 1241 by Shanon Ace, RN Outcome: Progressing   Problem: Pain Managment: Goal: General experience of comfort will improve 06/10/2019 1242 by Shanon Ace, RN Outcome: Progressing 06/10/2019 1241 by Shanon Ace, RN Outcome: Progressing   Problem: Safety: Goal: Ability to remain free from injury will improve 06/10/2019 1242 by Shanon Ace, RN Outcome: Progressing 06/10/2019 1241 by Shanon Ace, RN Outcome: Progressing   Problem: Skin Integrity: Goal: Risk for impaired skin integrity will decrease 06/10/2019 1242 by Shanon Ace, RN Outcome: Progressing 06/10/2019 1241 by Shanon Ace, RN Outcome: Progressing   Problem: Education: Goal: Ability to demonstrate management of disease process will improve 06/10/2019 1242 by Shanon Ace, RN Outcome: Progressing 06/10/2019 1241 by Shanon Ace, RN Outcome: Progressing Goal: Ability to verbalize understanding of medication therapies will improve 06/10/2019 1242 by Shanon Ace, RN Outcome: Progressing 06/10/2019 1241 by Shanon Ace, RN Outcome: Progressing Goal: Individualized Educational Video(s) 06/10/2019 1242 by Shanon Ace, RN Outcome: Progressing 06/10/2019 1241 by Shanon Ace, RN Outcome: Progressing   Problem: Activity: Goal: Capacity to carry out activities will improve 06/10/2019 1242 by Shanon Ace, RN Outcome: Progressing 06/10/2019 1241 by Shanon Ace, RN Outcome: Progressing   Problem: Cardiac: Goal: Ability to achieve and maintain adequate cardiopulmonary perfusion will  improve 06/10/2019 1242 by Shanon Ace, RN Outcome: Progressing 06/10/2019 1241 by Shanon Ace, RN Outcome: Progressing

## 2019-06-10 NOTE — Progress Notes (Signed)
Physical Therapy Treatment Patient Details Name: Dylan Goodman MRN: [id number] DOB: 1950/12/29 Today's Date: 06/10/2019    History of Present Illness 68 year old male with history of  atrial fibrillation, GI bleed, coronary artery disease status post CABG, nasopharyngeal cancer, cardiomyopathy, chronic depression, CKD stage 4, diabetes mellitus, GI bleed , peripheral vascular disease, previous smoker, recent Covid-19 infection who was sent from inpatient rehab for the evaluation of acute respiratory distress.    PT Comments    Pt limited by dizziness upon transition from supine to sitting. Pt attempted supine to sit twice, separated by LO bed level exercise, and was limited by dizziness on both attempts. Pt participated inLE therapeutic exercise well, with improved LE mobility. Pt also demonstrating improved attitude and motivation to participate in therapy this session. Pt will benefit from continued acute PT services to reduce caregiver burden and improve tolerance for mobility.   Follow Up Recommendations  CIR(may need to consider SNF if pt unable to tolerate OOB)     Equipment Recommendations  (defer to post-acute)    Recommendations for Other Services       Precautions / Restrictions Precautions Precautions: Fall Precaution Comments: buttocks pain from wound Restrictions Weight Bearing Restrictions: No    Mobility  Bed Mobility Overal bed mobility: Needs Assistance Bed Mobility: Supine to Sit;Sit to Supine Rolling: Supervision   Supine to sit: Mod assist Sit to supine: Min assist   General bed mobility comments: pt able to scoot up in bed with PT verbal cues and use of bed rails  Transfers                    Ambulation/Gait                 Stairs             Wheelchair Mobility    Modified Rankin (Stroke Patients Only)       Balance Overall balance assessment: Needs assistance Sitting-balance support: Bilateral upper extremity  supported;Feet supported Sitting balance-Leahy Scale: Poor Sitting balance - Comments: modA for static sitting balance at edge of bed, brief periods 2/2 dizziness Postural control: Posterior lean                                  Cognition Arousal/Alertness: Awake/alert Behavior During Therapy: WFL for tasks assessed/performed Overall Cognitive Status: Within Functional Limits for tasks assessed                                        Exercises General Exercises - Lower Extremity Ankle Circles/Pumps: AROM;Both;20 reps Gluteal Sets: AROM;Both;10 reps Heel Slides: AROM;Both;10 reps Hip ABduction/ADduction: AROM;Both;10 reps Straight Leg Raises: AROM;Both;10 reps    General Comments General comments (skin integrity, edema, etc.): pt on 15L HFNC, VSS.      Pertinent Vitals/Pain Pain Assessment: Faces Faces Pain Scale: Hurts a little bit Pain Location: buttocks Pain Descriptors / Indicators: Grimacing Pain Intervention(s): Limited activity within patient's tolerance    Home Living                      Prior Function            PT Goals (current goals can now be found in the care plan section) Acute Rehab PT Goals Patient Stated Goal: to feel better Progress towards PT  goals: Not progressing toward goals - comment(limited by dizziness)    Frequency    Min 3X/week      PT Plan Current plan remains appropriate;Frequency needs to be updated    Co-evaluation              AM-PAC PT "6 Clicks" Mobility   Outcome Measure  Help needed turning from your back to your side while in a flat bed without using bedrails?: A Little Help needed moving from lying on your back to sitting on the side of a flat bed without using bedrails?: A Lot Help needed moving to and from a bed to a chair (including a wheelchair)?: Total Help needed standing up from a chair using your arms (e.g., wheelchair or bedside chair)?: Total Help needed to walk  in hospital room?: Total Help needed climbing 3-5 steps with a railing? : Total 6 Click Score: 9    End of Session Equipment Utilized During Treatment: Oxygen Activity Tolerance: Patient limited by fatigue;Treatment limited secondary to medical complications (Comment)(dizziness) Patient left: in bed;with call bell/phone within reach;with family/visitor present Nurse Communication: Mobility status PT Visit Diagnosis: Unsteadiness on feet (R26.81);Other abnormalities of gait and mobility (R26.89);Muscle weakness (generalized) (M62.81);Difficulty in walking, not elsewhere classified (R26.2);Pain     Time: 6381-7711 PT Time Calculation (min) (ACUTE ONLY): 32 min  Charges:  $Therapeutic Exercise: 8-22 mins $Therapeutic Activity: 8-22 mins                     Zenaida Niece, PT, DPT Acute Rehabilitation Pager: [phone]    Zenaida Niece 06/10/2019, 3:41 PM

## 2019-06-10 NOTE — Progress Notes (Signed)
CRITICAL VALUE ALERT  Critical Value:  Calcium: 5.6  Date & Time Notied: 06/10/2019 0415  Provider Notified: Tylene Fantasia  Orders Received/Actions taken: Awaiting new orders. Will continue to monitor.

## 2019-06-10 NOTE — Progress Notes (Signed)
Dylan Goodman  Assessment/ Plan: Pt is a 68 y.o. yo male male with CHF, A. fib, DM, CKD with baseline creatinine around 2 admitted to [facility] from 9/14-9/19, readmitted on 10/6 status post cardiac arrest and suffered AKI on CKD required CRRT till 10/29.  #AKI on CKD, baseline creatinine around 2.  AKI likely ATN after cardiac arrest and concomitant with COVID-19 infection.  Initially treated with Lasix and then started CRRT from 10/24-10/29. -Patient had renal recovery therefore the HD catheter was discontinued on 11/6.   -Tolerating diuretics, good urine output, stable GFR, continue 3 times daily Lasix today - Probably at new baseline GFR, tolerating diuretics, progressively improving volume status  #Metabolic acidosis: Serum Bicarbonate stable, continue to monitor.    #Acute hypoxic respiratory failure/pneumonia due to COVID-19 virus: Completed course of remdesivir and steroid.  Initially clinically improved.  Slowly improving  #Acute on chronic systolic CHF exacerbation: IV diuretics as above.  Cardiology is following.  LVEF 20 to 25%.  #Hypotension/possible adrenal insufficiency: On  steroid and midodrine.  Blood pressure is improved.  #ESBL UTI possible acute urinary retention: Failed trial of voiding, continue Foley until follow-up with urology.  Subjective:   Having urinary retention requiring I&O cath, significant volume after Foley catheter removed  2.8 L urine output yesterday on Lasix 80 IV 3 times daily  Stable serum creatinine of 2.78, K5.3, bicarbonate 28 today  Patient without complaint other than urinary retention, feels breathing is stable  Continues on high flow nasal cannula  Objective Vital signs in last 24 hours: Vitals:   06/10/19 0709 06/10/19 0843 06/10/19 0847 06/10/19 0928  BP:    109/81  Pulse: 79 82  88  Resp: 19 (!) 23    Temp:      TempSrc:      SpO2: 94% 94% 94%   Weight:      Height:        Weight change: -8.165 kg  Intake/Output Summary (Last 24 hours) at 06/10/2019 1201 Last data filed at 06/10/2019 5885 Gross per 24 hour  Intake 801.64 ml  Output 2800 ml  Net -1998.36 ml       Labs: Basic Metabolic Panel: Recent Labs  Lab 06/04/19 0337  06/08/19 0340 06/09/19 0420 06/10/19 0349  NA 142   < > 142 141 136  K 3.9   < > 3.4* 3.5 5.3*  CL 110   < > 107 104 94*  CO2 21*   < > 22 25 28   GLUCOSE 134*   < > 136* 74 130*  BUN 53*   < > 59* 64* 67*  CREATININE 2.28*   < > 2.71* 2.73* 2.78*  CALCIUM 7.0*   < > 6.4* 6.1* 5.6*  PHOS 3.3  --   --   --   --    < > = values in this interval not displayed.   Liver Function Tests: Recent Labs  Lab 06/20/2019 0501  AST 47*  ALT 43  ALKPHOS 269*  BILITOT 1.3*  PROT 4.9*  ALBUMIN 2.5*   No results for input(s): LIPASE, AMYLASE in the last 168 hours. No results for input(s): AMMONIA in the last 168 hours. CBC: Recent Labs  Lab 06/04/19 0337 06/05/19 0349 06/29/2019 0501 06/07/19 0253 06/10/19 0349  WBC 8.1 8.0 13.4* 9.3 8.0  NEUTROABS  --   --  10.0* 7.5 6.1  HGB 10.6* 10.5* 11.7* 11.4* 11.4*  HCT 35.1* 34.6* 40.8 37.2* 35.2*  MCV 100.9*  103.0* 107.1* 103.6* 96.2  PLT 108* 127* 185 157 147*   Cardiac Enzymes: No results for input(s): CKTOTAL, CKMB, CKMBINDEX, TROPONINI in the last 168 hours. CBG: Recent Labs  Lab 06/09/19 1133 06/09/19 1657 06/09/19 2034 06/10/19 0556 06/10/19 1110  GLUCAP 106* 179* 213* 117* 160*    Iron Studies: No results for input(s): IRON, TIBC, TRANSFERRIN, FERRITIN in the last 72 hours. Studies/Results: Dg Chest 1 View  Result Date: 06/10/2019 CLINICAL DATA:  68 year old male with acute respiratory failure. EXAM: CHEST  1 VIEW COMPARISON:  Chest radiograph dated 06/07/2019. FINDINGS: Small left pleural effusion or pleural thickening similar to prior radiograph. There is diffuse interstitial coarsening and diffuse hazy airspace density in the left lung. Right lung  interstitial coarsening. Overall no significant interval change in the appearance of the lungs since the prior radiograph. A tiny right apical pneumothorax may be present. Attention on follow-up imaging recommended. Stable cardiomegaly. Median sternotomy wires. No acute osseous pathology. IMPRESSION: 1. No significant interval change in the appearance of the lungs since the prior radiograph. 2. Stable small left pleural effusion or pleural thickening. 3. Possible tiny right apical pneumothorax. Attention on follow-up imaging recommended. Electronically Signed   By: Anner Crete M.D.   On: 06/10/2019 09:52    Medications: Infusions: . sodium chloride 10 mL/hr at 06/08/19 0630  . ceFEPime (MAXIPIME) IV 2 g (06/09/19 2220)  . vancomycin 1,000 mg (06/10/19 0102)    Scheduled Medications: . amiodarone  400 mg Oral BID  . calcitRIOL  0.25 mcg Oral Daily  . Chlorhexidine Gluconate Cloth  6 each Topical Daily  . furosemide  80 mg Intravenous Q8H  . Gerhardt's butt cream   Topical QID  . guaiFENesin  600 mg Oral BID  . insulin aspart  0-5 Units Subcutaneous QHS  . insulin aspart  0-9 Units Subcutaneous TID WC  . insulin glargine  25 Units Subcutaneous QHS  . ipratropium-albuterol  3 mL Inhalation BID  . lactobacillus acidophilus  2 tablet Oral TID  . levETIRAcetam  500 mg Oral BID  . Melatonin  6 mg Oral QHS  . metoprolol tartrate  50 mg Oral Q6H  . midodrine  5 mg Oral TID WC  . mometasone-formoterol  2 puff Inhalation BID  . pantoprazole  40 mg Oral Daily  . polyethylene glycol  17 g Oral Daily  . [START ON 06/17/2019] predniSONE  10 mg Oral Q breakfast  . [START ON 06/14/2019] predniSONE  20 mg Oral Q breakfast  . [START ON 06/11/2019] predniSONE  30 mg Oral Q breakfast  . predniSONE  40 mg Oral Q breakfast  . [START ON 06/20/2019] predniSONE  5 mg Oral Q breakfast  . rivaroxaban  15 mg Oral Q supper  . sodium bicarbonate  1,300 mg Oral BID  . sodium chloride flush  10-40 mL  Intracatheter Q12H  . sodium chloride flush  3 mL Intravenous Q12H  . tamsulosin  0.4 mg Oral Daily    have reviewed scheduled and prn medications.  Physical Exam: General: On nasal cannula, not in distress Heart:RRR, s1s2 nl, no rub Lungs: Bibasal crackles, no wheezing Abdomen:soft, Non-tender, non-distended Extremities: Trace lower extremity edema Dialysis Access: No HD catheter present  Dylan Goodman 06/10/2019,12:01 PM  LOS: 4 days

## 2019-06-10 NOTE — Progress Notes (Signed)
Progress Note  Patient Name: Dylan Goodman Date of Encounter: 06/10/2019  Primary Cardiologist: Corey Skains, MD   Subjective   Not sleeping well, tired. Has not gotten up to walk yet today. No chest pain. Breathing is unchanged from yesterday, still short with limited exertion.  Inpatient Medications    Scheduled Meds: . amiodarone  400 mg Oral BID  . calcitRIOL  0.25 mcg Oral Daily  . Chlorhexidine Gluconate Cloth  6 each Topical Daily  . furosemide  80 mg Intravenous Q8H  . Gerhardt's butt cream   Topical QID  . guaiFENesin  600 mg Oral BID  . insulin aspart  0-5 Units Subcutaneous QHS  . insulin aspart  0-9 Units Subcutaneous TID WC  . insulin glargine  25 Units Subcutaneous QHS  . ipratropium-albuterol  3 mL Inhalation BID  . lactobacillus acidophilus  2 tablet Oral TID  . levETIRAcetam  500 mg Oral BID  . Melatonin  6 mg Oral QHS  . metoprolol tartrate  50 mg Oral Q6H  . midodrine  5 mg Oral TID WC  . mometasone-formoterol  2 puff Inhalation BID  . pantoprazole  40 mg Oral Daily  . polyethylene glycol  17 g Oral Daily  . [START ON 06/17/2019] predniSONE  10 mg Oral Q breakfast  . [START ON 06/14/2019] predniSONE  20 mg Oral Q breakfast  . [START ON 06/11/2019] predniSONE  30 mg Oral Q breakfast  . predniSONE  40 mg Oral Q breakfast  . [START ON 06/20/2019] predniSONE  5 mg Oral Q breakfast  . rivaroxaban  15 mg Oral Q supper  . sodium bicarbonate  1,300 mg Oral BID  . sodium chloride flush  10-40 mL Intracatheter Q12H  . sodium chloride flush  3 mL Intravenous Q12H  . tamsulosin  0.4 mg Oral Daily   Continuous Infusions: . sodium chloride 10 mL/hr at 06/08/19 0630  . ceFEPime (MAXIPIME) IV 2 g (06/09/19 2220)  . vancomycin 1,000 mg (06/10/19 0921)   PRN Meds: sodium chloride, acetaminophen, LORazepam, ondansetron (ZOFRAN) IV, sodium chloride flush, sodium chloride flush, zolpidem   Vital Signs    Vitals:   06/10/19 0709 06/10/19 0843 06/10/19  0847 06/10/19 0928  BP:    109/81  Pulse: 79 82  88  Resp: 19 (!) 23  17  Temp:    98.1 F (36.7 C)  TempSrc:    Oral  SpO2: 94% 94% 94% 93%  Weight:      Height:        Intake/Output Summary (Last 24 hours) at 06/10/2019 1411 Last data filed at 06/10/2019 0937 Gross per 24 hour  Intake 801.64 ml  Output 2800 ml  Net -1998.36 ml   Last 3 Weights 06/10/2019 06/09/2019 06/08/2019  Weight (lbs) 173 lb 191 lb 184 lb  Weight (kg) 78.472 kg 86.637 kg 83.462 kg      Telemetry    Atrial fibrillation, rate controlled- Personally Reviewed  ECG    06/05/2019 afib RVR with nonspecific ST changed - Personally Reviewed  Physical Exam   GEN: Well nourished, well developed in no acute distress HEENT: Normal, moist mucous membranes NECK: JVD to low neck at 45 degrees CARDIAC: irregularly ir regular rhythm, normal S1 and S2, no rubs or gallops. No murmurs. VASCULAR: Radial and DP pulses 2+ bilaterally. No carotid bruits RESPIRATORY:  Distant breath sounds but less coarseness today, bibasilar rales ABDOMEN: Soft, non-tender, non-distended MUSCULOSKELETAL:  Ambulates independently SKIN: Warm and dry, no edema NEUROLOGIC:  Alert and oriented x 3. No focal neuro deficits noted. PSYCHIATRIC:  Normal affect   Labs    High Sensitivity Troponin:   Recent Labs  Lab 05/24/19 1630 05/24/19 1845 05/26/19 0650 05/27/19 0615  TROPONINIHS 261* 774* 2,883* 1,611*      Chemistry Recent Labs  Lab 06/19/2019 0501  06/08/19 0340 06/09/19 0420 06/10/19 0349  NA 143   < > 142 141 136  K 4.8   < > 3.4* 3.5 5.3*  CL 114*   < > 107 104 94*  CO2 18*   < > 22 25 28   GLUCOSE 196*   < > 136* 74 130*  BUN 47*   < > 59* 64* 67*  CREATININE 2.51*   < > 2.71* 2.73* 2.78*  CALCIUM 6.9*   < > 6.4* 6.1* 5.6*  PROT 4.9*  --   --   --   --   ALBUMIN 2.5*  --   --   --   --   AST 47*  --   --   --   --   ALT 43  --   --   --   --   ALKPHOS 269*  --   --   --   --   BILITOT 1.3*  --   --   --   --    GFRNONAA 26*   < > 24* 23* NOT CALCULATED  GFRAA 30*   < > 27* 27* NOT CALCULATED  ANIONGAP 11   < > 13 12 14    < > = values in this interval not displayed.     Hematology Recent Labs  Lab 06/23/2019 0501 06/07/19 0253 06/10/19 0349  WBC 13.4* 9.3 8.0  RBC 3.81* 3.59* 3.66*  HGB 11.7* 11.4* 11.4*  HCT 40.8 37.2* 35.2*  MCV 107.1* 103.6* 96.2  MCH 30.7 31.8 31.1  MCHC 28.7* 30.6 32.4  RDW 20.2* 19.8* 19.0*  PLT 185 157 147*    BNPNo results for input(s): BNP, PROBNP in the last 168 hours.   DDimer No results for input(s): DDIMER in the last 168 hours.   Radiology    Dg Chest 1 View  Result Date: 06/10/2019 CLINICAL DATA:  68 year old male with acute respiratory failure. EXAM: CHEST  1 VIEW COMPARISON:  Chest radiograph dated 06/07/2019. FINDINGS: Small left pleural effusion or pleural thickening similar to prior radiograph. There is diffuse interstitial coarsening and diffuse hazy airspace density in the left lung. Right lung interstitial coarsening. Overall no significant interval change in the appearance of the lungs since the prior radiograph. A tiny right apical pneumothorax may be present. Attention on follow-up imaging recommended. Stable cardiomegaly. Median sternotomy wires. No acute osseous pathology. IMPRESSION: 1. No significant interval change in the appearance of the lungs since the prior radiograph. 2. Stable small left pleural effusion or pleural thickening. 3. Possible tiny right apical pneumothorax. Attention on follow-up imaging recommended. Electronically Signed   By: Anner Crete M.D.   On: 06/10/2019 09:52    Cardiac Studies   Echo 06/05/2019 1. Left ventricular ejection fraction, by visual estimation, is 20 to 25%. The left ventricle has severely decreased function. Left ventricular septal wall thickness was normal. There is no left ventricular hypertrophy.  2. Left ventricular diastolic parameters are indeterminate.  3. Moderately dilated left ventricular  internal cavity size.  4. Diffuse hypokinesis EF has decreased since echo done 05/08/19.  5. Global right ventricle has normal systolic function.The right ventricular size is normal. No increase  in right ventricular wall thickness.  6. Left atrial size was severely dilated.  7. Right atrial size was normal.  8. The mitral valve is normal in structure. Mild mitral valve regurgitation.  9. The tricuspid valve is normal in structure. Tricuspid valve regurgitation moderate. 10. The aortic valve is tricuspid. Aortic valve regurgitation is mild. Mild to moderate aortic valve sclerosis/calcification without any evidence of aortic stenosis. 11. The pulmonic valve was grossly normal. Pulmonic valve regurgitation is mild. 12. Moderately elevated pulmonary artery systolic pressure. 13. The interatrial septum was not well visualized.  Patient Profile     68 y.o. male with a hx of CAD s/p [id number] and PCI (details unclear), persistent atrial fibrillation,chronic systolic CHF, PAD (h/orevascularization stenting and fem-pop bypass grafting), carotid artery disease (chronic L ICAocclusion per notes), HTN, DM,chronic diarrhea, nasopharyngeal CA, anemia, gastric YQIHK,VQQ,[id number] PNAwith complications, CKD stage IV (recently requiring CRRT)who is being followed for the evaluation of worsening LV functionat the request of Dr. Tawanna Solo. He has multiple recent hospitalizations, including for Covid 03/2019 and post cardiac arrest 10/6-10/29.  Assessment & Plan   Acute hypoxic respiratory failure, pleural effusions, possible multifocal pneumonia, acute on chronic systolic and diastolic heart failure with EF 20-25%: -s/p thoracentesis; R thora with 700cc clear yellow fluid 11/8 -diuresis per nephrology given renal failure, currently dosing 80 mg IV furosemide q8 hours with metolazone PRN -Cr elevated from recent nadir 2.28, has been in the 2.7 range for last several days -on antibiotics for HCAP per primary  team -worsening EF likely 2/2 afib RVR -most recent Covid swab negative -net negative 7.4 L per measured amounts, but weight trend variable (89.8 kg -> 83.5 kg -> 86.6 kg -> 78.5 kg)  Atrial fibrillation with RVR:  -on rivaroxaban, tolerating. Dose is 15 mg given renal disease; if CrCl drops to <15, will need to adjust therapy. Started 11/7. Per consult note 11/7, would need full dose apixaban but reduced dose rivaroxaban, hence the change. -we discussed medical management vs. TEE-CV. He would like to avoid procedure if possible. With his respiratory status, would like to avoid TEE, which he agrees with. Has severe LA enlargement, unlikely to hold sinus without antiarrhythmic. If we can rate control, would continue to load with amio, keep on anticoagulation, then cardiovert only in 3 weeks. -changing from IV to oral amiodarone today -Encouraged him to exert himself as much as he is able with PT to determine if his heart rate will stay well controlled.  -continue metoprolol 50 mg q6 hours, consolidate prior to discharge. -calcium being repleted, follow K/Mg  CAD, with prior CABG: no acute complaints for ischemia. With his multiple medical issues and severity of his current disease, would not check troponin unless he develops symptoms concerning for ACS.  Per primary/additional consulting teams: Acute on chronic kidney failure: baseline stage 4, required CRRT with recent admission Hypotension: chronic, ? 2/2 adrenal insufficiency, on midodrine and prednisone ESBL UTI with urinary retentions History of seiures, on Keppra Insulin-dependent type II diabetes  For questions or updates, please contact [place] Please consult [email or web address] for contact info under     Signed, Buford Dresser, MD  06/10/2019, 2:11 PM

## 2019-06-10 NOTE — Plan of Care (Signed)
  Problem: Education: Goal: Knowledge of General Education information will improve Description: Including pain rating scale, medication(s)/side effects and non-pharmacologic comfort measures Outcome: Progressing   Problem: Health Behavior/Discharge Planning: Goal: Ability to manage health-related needs will improve Outcome: Progressing   Problem: Clinical Measurements: Goal: Ability to maintain clinical measurements within normal limits will improve Outcome: Progressing Goal: Will remain free from infection Outcome: Progressing Goal: Diagnostic test results will improve Outcome: Progressing Goal: Respiratory complications will improve Outcome: Progressing Goal: Cardiovascular complication will be avoided Outcome: Progressing   Problem: Activity: Goal: Risk for activity intolerance will decrease Outcome: Progressing   Problem: Nutrition: Goal: Adequate nutrition will be maintained Outcome: Progressing   Problem: Coping: Goal: Level of anxiety will decrease Outcome: Progressing   Problem: Elimination: Goal: Will not experience complications related to bowel motility Outcome: Progressing Goal: Will not experience complications related to urinary retention Outcome: Progressing   Problem: Cardiac: Goal: Ability to achieve and maintain adequate cardiopulmonary perfusion will improve Outcome: Progressing   Problem: Activity: Goal: Capacity to carry out activities will improve Outcome: Progressing   Problem: Education: Goal: Ability to demonstrate management of disease process will improve Outcome: Progressing Goal: Ability to verbalize understanding of medication therapies will improve Outcome: Progressing Goal: Individualized Educational Video(s) Outcome: Progressing   Problem: Skin Integrity: Goal: Risk for impaired skin integrity will decrease Outcome: Progressing   Problem: Safety: Goal: Ability to remain free from injury will improve Outcome: Progressing

## 2019-06-10 NOTE — Progress Notes (Addendum)
PROGRESS NOTE    Dylan Goodman  CWC:[id number] DOB: Jan 11, 1951 DOA: 06/02/2019 PCP: Steele Sizer, MD   Brief Narrative:  Patient is a 68 year old male with history of  atrial fibrillation, GI bleed, coronary artery disease status post CABG, nasopharyngeal cancer, cardiomyopathy, chronic depression, CKD stage 4, diabetes mellitus, GI bleed , peripheral vascular disease, previous smoker, recent Covid-19 infection who was sent from inpatient rehab for the evaluation of acute respiratory distress.  He was just discharged from here to inpatient rehab on 06/05/19.  He developed respiratory distress overnight and had to be kept on BiPAP.  He was admitted for the management of acute on chronic CHF exacerbation.he was also noted to have peripheral edema, diffuse Rales.  Chest x-ray concerning for pulmonary edema.  CT chest without contrast  showed possible multifocal pneumonia so he has been started on antibiotics.  He is also being followed by cardiology for new finding of worsening systolic heart failure and A. fib with RVR.Plan for cardioversion when respiratory status is more stable. He is currently on high flow oxygen.  Hospital course remarkable for very slow improvement in the respiratory status.  Nephrology following and he is on IV diuresis.I have also requested PCCM consult today.   Assessment & Plan:   Principal Problem:   Respiratory failure, acute ([place]) Active Problems:   CKD (chronic kidney disease), stage IV (HCC)   Coronary artery disease involving coronary bypass graft of native heart   IDA (iron deficiency anemia)   Type 2 diabetes mellitus with stage 4 chronic kidney disease, with long-term current use of insulin (HCC)   Metabolic acidosis   Atrial fibrillation with RVR (HCC)   Seizure (HCC)   Acute on chronic systolic (congestive) heart failure (HCC)   Acute on chronic respiratory failure: Secondary to multifocal pneumonia, pulmonary edema. CT chest showed :Persistent  mixed interstitial airspace process bilaterally most prominent over the upper lobes with interval improvement in airspace density over the right middle lobe. Findings are concerning for  ongoing infection. Also showed table small left effusion and worsening moderate size right pleural effusion. Started on  on vancomycin and cefepime,day 4.  Also underwent  US guided thoracentesis of the right sided moderate pleural effusion with removal of 700 ml of clear fluid( transudative).  Continue Lasix as per nephrology.ABG on 06/07/2019 showed normal CO2 but decreased oxygenation. He was intubated for airway protection on last admission when he presented with seizures.He also had Covid few months ago. Currently he is on oxygen at 15 L/min. I have requested for PCCM consult today.  Chest x-ray done this morning does not show any significant change from last time with diffuse interstitial coarsening and diffuse hazy airspace density in the left lung. Continue bipap at night.  Acute on chronic systolic congestive heart failure: Echo done on this admission showed ejection fraction to 20-25%.  Diffuse hypokinesis, severely reduced left ventricular function. echocardiogram done on 05/08/2019 had shown ejection fraction of 40- 45% . Cardiology following.  Continue diuresis and he is having good urine output.  A. fib with RVR: Hospital course remarkable for Afib with RVR  .  Cardiology following.  On Eliquis at home for anticoagulation .  Anticoagulation changed to Xarelto.  Cardiology planning for DCCV when his respiratory status is more stable.   Heart rate was controlled this morning.  On amiodarone .  Also on rate control with metoprolol.  AKI on CKD stage 4:  Baseline creatinine around 2.5-3.  Nephrology following.  Continue diuresis  Seizure:  Continue Keppra.  Follow-up with neurology as an outpatient.  Insulin-dependent diabetic mellitus: Hemoglobin A1c of 6.7 in March 10, 2019.  On insulin at home.   Continue current insulin regimen.  Chronic hypotension: Suspected to be from adrenal insufficiency.  Currently on midodrine.Also on tapering steroids.  Hypocalcemia/hypokalemia: Being supplemented  Urinary retention: Has Foley catheter.  Needs to follow-up with urology as an outpatient.  Debility/deconditioning: Was just discharged to rehab.  PT/OT following        DVT prophylaxis: Xarelto Code Status: Full Family Communication: Discussed with wife on phone on 06/07/19 .called again today,not received. She is well informed about him Disposition Plan: Undetermined at this point.  Back to back to CIR when medically stable   Consultants: Cardiology, nephrology  Procedures: None  Antimicrobials:  Anti-infectives (From admission, onward)   Start     Dose/Rate Route Frequency Ordered Stop   06/08/19 2156  ceFEPIme (MAXIPIME) 2 g in sodium chloride 0.9 % 100 mL IVPB     2 g 200 mL/hr over 30 Minutes Intravenous Every 24 hours 06/08/19 0836     06/08/19 1000  vancomycin (VANCOCIN) IVPB 1000 mg/200 mL premix     1,000 mg 200 mL/hr over 60 Minutes Intravenous Every 24 hours 06/07/19 1109     06/07/19 1115  ceFEPIme (MAXIPIME) 2 g in sodium chloride 0.9 % 100 mL IVPB  Status:  Discontinued     2 g 200 mL/hr over 30 Minutes Intravenous Every 12 hours 06/07/19 1109 06/08/19 0836   06/07/19 1115  vancomycin (VANCOCIN) 1,500 mg in sodium chloride 0.9 % 500 mL IVPB     1,500 mg 250 mL/hr over 120 Minutes Intravenous  Once 06/07/19 1109 06/07/19 1412      Subjective:  Patient seen and examined the bedside this morning.  Hemodynamically stable.  He still on high-flow dose of oxygen at 15Lper minute.  Heart rate well controlled.  He says he feels terrible because he could not sleep at all last night.  Respiratory status might be slightly better than yesterday and he was speaking in full sentences but looks  dyspneic.  Objective: Vitals:   06/10/19 0709 06/10/19 0843 06/10/19 0847 06/10/19  0928  BP:    109/81  Pulse: 79 82  88  Resp: 19 (!) 23  17  Temp:    98.1 F (36.7 C)  TempSrc:    Oral  SpO2: 94% 94% 94% 93%  Weight:      Height:        Intake/Output Summary (Last 24 hours) at 06/10/2019 1415 Last data filed at 06/10/2019 0937 Gross per 24 hour  Intake 801.64 ml  Output 2800 ml  Net -1998.36 ml   Filed Weights   06/08/19 0500 06/09/19 0648 06/10/19 0500  Weight: 83.5 kg 86.6 kg 78.5 kg    Examination:  General exam: In mild to moderate respitory distress, very deconditioned, debilitated HEENT:PERRL,Oral mucosa moist, Ear/Nose normal on gross exam Respiratory system: Bilateral decreased air entry, very faint crackles on the bases Cardiovascular system: A. fib. No JVD, murmurs, rubs, gallops or clicks. Gastrointestinal system: Abdomen is nondistended, soft and nontender. No organomegaly or masses felt. Normal bowel sounds heard. Central nervous system: Alert and oriented. No focal neurological deficits. Extremities: Trace edema, no clubbing ,no cyanosis, distal peripheral pulses palpable. Skin: No rashes, lesions or ulcers,no icterus ,no pallor   Data Reviewed: I have personally reviewed following labs and imaging studies  CBC: Recent Labs  Lab 06/04/19 0337 06/05/19 0349 06/03/2019 0501  06/07/19 0253 06/10/19 0349  WBC 8.1 8.0 13.4* 9.3 8.0  NEUTROABS  --   --  10.0* 7.5 6.1  HGB 10.6* 10.5* 11.7* 11.4* 11.4*  HCT 35.1* 34.6* 40.8 37.2* 35.2*  MCV 100.9* 103.0* 107.1* 103.6* 96.2  PLT 108* 127* 185 157 676*   Basic Metabolic Panel: Recent Labs  Lab 06/04/19 0337  06/18/2019 0501 06/07/19 0253 06/08/19 0340 06/09/19 0420 06/10/19 0349  NA 142   < > 143 141 142 141 136  K 3.9   < > 4.8 4.4 3.4* 3.5 5.3*  CL 110   < > 114* 106 107 104 94*  CO2 21*   < > 18* 18* 22 25 28   GLUCOSE 134*   < > 196* 365* 136* 74 130*  BUN 53*   < > 47* 51* 59* 64* 67*  CREATININE 2.28*   < > 2.51* 2.36* 2.71* 2.73* 2.78*  CALCIUM 7.0*   < > 6.9* 6.3* 6.4*  6.1* 5.6*  PHOS 3.3  --   --   --   --   --   --    < > = values in this interval not displayed.   GFR: Estimated Creatinine Clearance: 28.2 mL/min (A) (by C-G formula based on SCr of 2.78 mg/dL (H)). Liver Function Tests: Recent Labs  Lab 06/02/2019 0501  AST 47*  ALT 43  ALKPHOS 269*  BILITOT 1.3*  PROT 4.9*  ALBUMIN 2.5*   No results for input(s): LIPASE, AMYLASE in the last 168 hours. No results for input(s): AMMONIA in the last 168 hours. Coagulation Profile: No results for input(s): INR, PROTIME in the last 168 hours. Cardiac Enzymes: No results for input(s): CKTOTAL, CKMB, CKMBINDEX, TROPONINI in the last 168 hours. BNP (last 3 results) No results for input(s): PROBNP in the last 8760 hours. HbA1C: No results for input(s): HGBA1C in the last 72 hours. CBG: Recent Labs  Lab 06/09/19 1133 06/09/19 1657 06/09/19 2034 06/10/19 0556 06/10/19 1110  GLUCAP 106* 179* 213* 117* 160*   Lipid Profile: No results for input(s): CHOL, HDL, LDLCALC, TRIG, CHOLHDL, LDLDIRECT in the last 72 hours. Thyroid Function Tests: No results for input(s): TSH, T4TOTAL, FREET4, T3FREE, THYROIDAB in the last 72 hours. Anemia Panel: No results for input(s): VITAMINB12, FOLATE, FERRITIN, TIBC, IRON, RETICCTPCT in the last 72 hours. Sepsis Labs: Recent Labs  Lab 06/18/2019 0501 06/04/2019 0930 06/07/19 0253  PROCALCITON 0.17  --  2.10  LATICACIDVEN 2.9* 1.8  --     Recent Results (from the past 240 hour(s))  Urine culture     Status: Abnormal   Collection Time: 06/05/19  5:45 PM   Specimen: Urine, Catheterized  Result Value Ref Range Status   Specimen Description URINE, CATHETERIZED  Final   Special Requests   Final    PATIENT ON FOLLOWING ZYVOX Performed at [facility], [place] [address], [place], [place] [address]    Culture 80,000 COLONIES/mL YEAST (A)  Final   Report Status 06/05/2019 FINAL  Final  Gram stain     Status: None   Collection Time: 06/07/19 11:22 AM    Specimen: Lung, Right; Pleural Fluid  Result Value Ref Range Status   Specimen Description PLEURAL RIGHT  Final   Special Requests NONE  Final   Gram Stain   Final    FEW WBC PRESENT,BOTH PMN AND MONONUCLEAR NO ORGANISMS SEEN Performed at [facility], [place] [address] [place], [place] [address]    Report Status 06/07/2019 FINAL  Final  Culture, body fluid-bottle     Status: None (Preliminary result)   Collection Time: 06/07/19 11:23 AM   Specimen: Pleura  Result Value Ref Range Status   Specimen Description PLEURAL RIGHT  Final   Special Requests NONE  Final   Culture   Final    NO GROWTH 3 DAYS Performed at [facility], [address], [place], [place] [address]    Report Status PENDING  Incomplete         Radiology Studies: Dg Chest 1 View  Result Date: 06/10/2019 CLINICAL DATA:  68 year old male with acute respiratory failure. EXAM: CHEST  1 VIEW COMPARISON:  Chest radiograph dated 06/07/2019. FINDINGS: Small left pleural effusion or pleural thickening similar to prior radiograph. There is diffuse interstitial coarsening and diffuse hazy airspace density in the left lung. Right lung interstitial coarsening. Overall no significant interval change in the appearance of the lungs since the prior radiograph. A tiny right apical pneumothorax may be present. Attention on follow-up imaging recommended. Stable cardiomegaly. Median sternotomy wires. No acute osseous pathology. IMPRESSION: 1. No significant interval change in the appearance of the lungs since the prior radiograph. 2. Stable small left pleural effusion or pleural thickening. 3. Possible tiny right apical pneumothorax. Attention on follow-up imaging recommended. Electronically Signed   By: Anner Crete M.D.   On: 06/10/2019 09:52        Scheduled Meds:  amiodarone  400 mg Oral BID   calcitRIOL  0.25 mcg Oral Daily   Chlorhexidine Gluconate Cloth  6 each Topical Daily   furosemide  80 mg  Intravenous Q8H   Gerhardt's butt cream   Topical QID   guaiFENesin  600 mg Oral BID   insulin aspart  0-5 Units Subcutaneous QHS   insulin aspart  0-9 Units Subcutaneous TID WC   insulin glargine  25 Units Subcutaneous QHS   ipratropium-albuterol  3 mL Inhalation BID   lactobacillus acidophilus  2 tablet Oral TID   levETIRAcetam  500 mg Oral BID   Melatonin  6 mg Oral QHS   metoprolol tartrate  50 mg Oral Q6H   midodrine  5 mg Oral TID WC   mometasone-formoterol  2 puff Inhalation BID   pantoprazole  40 mg Oral Daily   polyethylene glycol  17 g Oral Daily   [START ON 06/17/2019] predniSONE  10 mg Oral Q breakfast   [START ON 06/14/2019] predniSONE  20 mg Oral Q breakfast   [START ON 06/11/2019] predniSONE  30 mg Oral Q breakfast   predniSONE  40 mg Oral Q breakfast   [START ON 06/20/2019] predniSONE  5 mg Oral Q breakfast   rivaroxaban  15 mg Oral Q supper   sodium bicarbonate  1,300 mg Oral BID   sodium chloride flush  10-40 mL Intracatheter Q12H   sodium chloride flush  3 mL Intravenous Q12H   tamsulosin  0.4 mg Oral Daily   Continuous Infusions:  sodium chloride 10 mL/hr at 06/08/19 0630   ceFEPime (MAXIPIME) IV 2 g (06/09/19 2220)   vancomycin 1,000 mg (06/10/19 0921)     LOS: 4 days    Time spent: 35 mins.More than 50% of that time was spent in counseling and/or coordination of care.      Shelly Coss, MD [facility] Pager [phone]  If 7PM-7AM, please contact night-coverage [email or web address] Password [email or web address] 06/10/2019, 2:15 PM

## 2019-06-10 NOTE — Consult Note (Addendum)
NAME:  Dylan Goodman, MRN:  [id number], DOB:  [birth year], LOS: 4 ADMISSION DATE:  06/16/2019, CONSULTATION DATE:  10/7 REFERRING MD:  Dr Jimmye Norman [facility] EDP, CHIEF COMPLAINT:  Cardiac arrest   Brief History   68 year old male admitted with recent COVID pneumonia who was transferred from inpatient rehab due to acute respiratory distress. Originally thought to respiratory distress was secondary to CHF exacerbation but CT chest concerning for possible multifocal pneumonia therefore antibiotics were resumed.   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 Patient has had extensive hospitalizations over the last several months at several [place] facilities. Time line as follows;  -9/14-9/19 admitted to [facility] for COVID-19 pneumonia  -10/5 he presented to [facility] with complaints of chest pain. Cardiac workup was negative and he was felt to be dehydrated. He was given IVF and discharged home. -10/6 he called EMS due to dizziness, and upon their arrival he had a witnessed 2-minute tonic/clonic type seizure abated with versed 2mg . Upon arrival to ED he was awake, but confused. COVID Test again positive. -10/7 abruptly developed respiratory distress and tachycardia with the appearance of VT vs AF RVR. He lost pulses. He was defibrillated and CPR was started. Total downtime approximated at 11 mins. -10/19  CT chest done 10/19 revealing extensive patchy consolidation with ground glass opacity prominent in BUL suggestive of PNA, atelectasis or possible early post-inflammatory fibrosis. -10/21 due to lack of improvement in symptoms, decision was made to treat him again with Remdesivir and antibiotics.  He developed progressive confusion with agitation and was intubated -10/25-10/29 CRRT utilized -11/6 - 11/7 Admitted to [facility] [place]  program for rehab but developed respiratory distress with CXR showing increased pleural effusion on left, BIPAP required due to increased WOB. Transferred back to acute care.   During this admission he has received treatment for acute on chronic respiratory failure felt secondary to multifocal pneumonia and pulmonary edema with IV antibiotics, diuretics,  and US guided thoracentesis (766ml fluid removed).   PCCM consulted 11/11 due to continued high oxygen demand.   Past Medical History  COVID pneumonia  VRE and ESBL  Seizures Persistent A-fib PAD MI Hx of CABG GERD Type 2 diabetes  Hypertension CKD stage IV Chronic systolic CHF Cardiac arrest Adrenal insufficiency   Significant Hospital Events   11/7 Admitted   Consults:  PCCM  Procedures:  Right Thoracentesis with IR  11/8 >> 712ml removed   Significant Diagnostic Tests:  CT Chest 11/8 >>  1. Persistent mixed interstitial airspace process bilaterally most prominent over the upper lobes with interval improvement in airspace density over the right middle lobe. Findings are likely due to ongoing infection. Stable small left effusion and worsening moderate size right pleural effusion. 2. Aortic Atherosclerosis (ICD10-I70.0) and Emphysema (ICD10-J43.9). 3. 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 4.  Cardiomegaly and atherosclerotic coronary artery disease. 5.  Stable 1.5 cm liver hypodensity likely a cyst.  CXR 11/8 >> 1. No significant interval change in the appearance of the lungs since the prior radiograph. 2. Stable small left pleural effusion or pleural thickening. 3. Possible tiny right apical pneumothorax. Attention on follow-up imaging recommended.  Micro Data:  Blood cultures 11/8 >>  Pleural fluid 11/8 >>  Antimicrobials:  Vancomycin 11/8 >> Cefepime  11/8 >>  Interim history/subjective:  Seen sitting up in bed with no acute complaints. States he feels well with no acute complaints, denies any dyspnea or increased WOB  Objective   Blood pressure 125/75, pulse 91, temperature (!) 97.5 F (36.4 C), temperature source Oral, resp. rate (!) 25, height 5\' 11"  (1.803 m), weight 78.5 kg, SpO2 93 %.    FiO2 (%):  [40 %] 40 %   Intake/Output Summary (Last 24 hours) at 06/10/2019 1526 Last data filed at 06/10/2019 2585 Gross per 24 hour  Intake 801.64 ml  Output 2800 ml  Net -1998.36 ml   Filed Weights   06/08/19 0500 06/09/19 0648 06/10/19 0500  Weight: 83.5 kg 86.6 kg 78.5 kg    Examination: General: Chronically ill appearing elderly male/male, appears greater than stated age in NAD HEENT: MM pink/moist, PERRL,  Neuro: Alert and oriented, no focal neuro deficits  CV: s1s2 regular rate and rhythm, no murmur, rubs, or gallops,  PULM:  Diminished bilaterally very faint crackles bilaterally  GI: soft, bowel sounds active in all 4 quadrants, non-tender, non-distended Extremities: warm/dry, no edema  Skin: no rashes or lesions   Resolved Hospital Problem list   N/A  Assessment & Plan:   Acute on chronic respiratory failure  Pleural effusion  -In the setting on extensive medical history including multiple complication after COVID pneumonia. -Likely has early stages of fibrotic changes due to COVID  P: Pleural fluid transudative by lights criteria  Follow pleural fluid cultures Continue IV antibiotics until cultures resulted, if negative low threshold to discontinue antibiotics  Continue supplemental oxygen and PRN BIPAP as needed  May consider use of heated high flow nasal canula if patient can't tolerate BIPAP Unsure of benefit of steroids at this time will defer that decision to primary  Repeat CXR in am, CXR 11/11 with possible tiny right apical PTX Diurese as tolerated   Rest per primary team   Best practice:  Diet:  Heart healthy  Pain/Anxiety/Delirium protocol (if indicated):N/A VAP protocol (if indicated): N/A DVT prophylaxis: PO Xarelto  GI prophylaxis: PPI Glucose control: SSI Mobility: UP with assistance Code Status: FULL Family Communication: Spouse present at bedside and updated regarding plan  Disposition: Progressive care   Labs   CBC: Recent Labs  Lab 06/04/19 0337 06/05/19 0349 06/14/2019 0501 06/07/19 0253 06/10/19 0349  WBC 8.1 8.0 13.4* 9.3 8.0  NEUTROABS  --   --  10.0* 7.5 6.1  HGB 10.6* 10.5* 11.7* 11.4* 11.4*  HCT 35.1* 34.6* 40.8 37.2* 35.2*  MCV 100.9* 103.0* 107.1* 103.6* 96.2  PLT 108* 127* 185 157 147*    Basic Metabolic Panel: Recent Labs  Lab 06/04/19 0337  06/20/2019 0501 06/07/19 0253 06/08/19 0340 06/09/19 0420 06/10/19 0349  NA 142   < > 143 141 142 141 136  K 3.9   < > 4.8 4.4 3.4* 3.5 5.3*  CL 110   < > 114* 106 107 104 94*  CO2 21*   < > 18* 18* 22 25 28   GLUCOSE 134*   < > 196* 365* 136* 74 130*  BUN 53*   < > 47* 51* 59* 64* 67*  CREATININE 2.28*   < > 2.51* 2.36* 2.71* 2.73* 2.78*  CALCIUM 7.0*   < > 6.9* 6.3* 6.4* 6.1* 5.6*  PHOS 3.3  --   --   --   --   --   --    < > =  values in this interval not displayed.   GFR: Estimated Creatinine Clearance: 28.2 mL/min (A) (by C-G formula based on SCr of 2.78 mg/dL (H)). Recent Labs  Lab 06/05/19 0349 06/05/2019 0501 06/21/2019 0930 06/07/19 0253 06/10/19 0349  PROCALCITON  --  0.17  --  2.10  --   WBC 8.0 13.4*  --  9.3 8.0  LATICACIDVEN  --  2.9* 1.8  --   --     Liver Function Tests: Recent Labs  Lab 06/02/2019 0501  AST 47*  ALT 43  ALKPHOS 269*  BILITOT 1.3*  PROT 4.9*  ALBUMIN 2.5*   No results for input(s): LIPASE, AMYLASE in the last 168 hours. No results for input(s): AMMONIA in the last 168 hours.  ABG    Component Value Date/Time   PHART 7.420 06/07/2019 1008   PCO2ART 32.7 06/07/2019 1008   PO2ART 68.9 (L) 06/07/2019 1008   HCO3 21.0 06/07/2019 1008   TCO2 27  05/23/2019 0406   ACIDBASEDEF 2.9 (H) 06/07/2019 1008   O2SAT 94.0 06/07/2019 1008     Coagulation Profile: No results for input(s): INR, PROTIME in the last 168 hours.  Cardiac Enzymes: No results for input(s): CKTOTAL, CKMB, CKMBINDEX, TROPONINI in the last 168 hours.  HbA1C: Hgb A1c MFr Bld  Date/Time Value Ref Range Status  03/12/2019 07:14 AM 6.7 (H) 4.8 - 5.6 % Final    Comment:    (NOTE) Pre diabetes:          5.7%-6.4% Diabetes:              >6.4% Glycemic control for   <7.0% adults with diabetes   09/08/2018 12:03 PM 6.3 (H) <5.7 % of total Hgb Final    Comment:    For someone without known diabetes, a hemoglobin  A1c value between 5.7% and 6.4% is consistent with prediabetes and should be confirmed with a  follow-up test. . For someone with known diabetes, a value <7% indicates that their diabetes is well controlled. A1c targets should be individualized based on duration of diabetes, age, comorbid conditions, and other considerations. . This assay result is consistent with an increased risk of diabetes. . Currently, no consensus exists regarding use of hemoglobin A1c for diagnosis of diabetes for children. .     CBG: Recent Labs  Lab 06/09/19 1133 06/09/19 1657 06/09/19 2034 06/10/19 0556 06/10/19 1110  GLUCAP 106* 179* 213* 117* 160*    Review of Systems: Positive in bold  Gen: Denies fever, chills, weight change, fatigue, night sweats HEENT: Denies blurred vision, double vision, hearing loss, tinnitus, sinus congestion, rhinorrhea, sore throat, neck stiffness, dysphagia PULM: Denies shortness of breath, cough, sputum production, hemoptysis, wheezing CV: Denies chest pain, edema, orthopnea, paroxysmal nocturnal dyspnea, palpitations GI: Denies abdominal pain, nausea, vomiting, diarrhea, hematochezia, melena, constipation, change in bowel habits GU: Denies dysuria, hematuria, polyuria, oliguria, urethral discharge Endocrine: Denies hot or cold  intolerance, polyuria, polyphagia or appetite change Derm: Denies rash, dry skin, scaling or peeling skin change Heme: Denies easy bruising, bleeding, bleeding gums Neuro: Denies headache, numbness, weakness, slurred speech, loss of memory or consciousness  Past Medical History  He,  has a past medical history of 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), Adrenal insufficiency (HCC), Anemia, Bleeding gastrointestinal, BPH (benign prostatic hyperplasia), CAD (coronary artery disease), 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), Cardiac arrest ([place]), Cardiomyopathy ([place]), Carotid artery disease ([place]), Chronic depression, Chronic systolic CHF (congestive heart failure) ([place]), CKD (chronic kidney disease), stage IV ([place]),  COVID-19 virus infection, Diabetes mellitus ([place]), Essential hypertension, Gastric ulcer with hemorrhage, Gastritis, GERD (gastroesophageal reflux disease), Hematuria, CABG (12/1994), IDA (iron deficiency anemia), 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   Surgical History    Past Surgical History:  Procedure Laterality Date  . CAROTID ENDARTERECTOMY  2006  . COLONOSCOPY WITH PROPOFOL N/A 03/11/2019   Procedure: COLONOSCOPY WITH PROPOFOL;  Surgeon: Toledo, Benay Pike, MD;  Location: [facility] ENDOSCOPY;  Service: Gastroenterology;  Laterality: N/A;  . CORONARY ARTERY BYPASS GRAFT  1996   quadruple bypass  . ESOPHAGOGASTRODUODENOSCOPY (EGD) WITH PROPOFOL N/A 03/11/2019   Procedure: ESOPHAGOGASTRODUODENOSCOPY (EGD) WITH PROPOFOL;  Surgeon: Toledo, Benay Pike, MD;  Location: [facility] ENDOSCOPY;  Service: Gastroenterology;  Laterality: N/A;  . FEMORAL-POPLITEAL BYPASS GRAFT Right 06/1998   11/2001,  08/2003  . 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 LOWER EXTREMITY ANGIOGRAPHY Left 11/11/2018   Procedure: LOWER EXTREMITY ANGIOGRAPHY;  Surgeon: Katha Cabal, MD;  Location: [facility];  Service: Cardiovascular;  Laterality: Left;  . REVISION OF AORTA BIFEMORAL BYPASS    . Stent revascularization left leg  08/2009  . Stent revascularization right leg  07/2009     Social History   reports that he quit smoking about 2 years ago. His smoking use included cigarettes. He has a 75.00 pack-year smoking history. He has never used smokeless tobacco. He reports previous alcohol use. He reports that he does not use drugs.   Family History   His family history includes Alcohol abuse in his father; Breast cancer in his sister; Colon cancer in his father; Coronary artery disease in his father and mother; Kidney cancer in his father.   Allergies No Known Allergies   Home Medications  Prior to Admission medications   Medication Sig Start Date End Date Taking? Authorizing Provider  apixaban (ELIQUIS) 5 MG TABS tablet Take 1 tablet (5 mg total) by mouth 2 (two) times daily. 04/18/19   Patrecia Pour, MD  budesonide-formoterol (SYMBICORT) 160-4.5 MCG/ACT inhaler Inhale 2 puffs into the lungs 2 (two) times daily. Rinse mouth after use 03/17/19   Laverle Hobby, MD  Calcium Carbonate-Vitamin D3 (CALCIUM 600-D) 600-400 MG-UNIT TABS Take 1 tablet by mouth daily.    [provider]  Cholecalciferol (VITAMIN D3) 25 MCG (1000 UT) CAPS Take by mouth.    [provider]  cilostazol (PLETAL) 100 MG tablet Take 100 mg by mouth 2 (two) times daily.     [provider]  colchicine 0.6 MG tablet Take 0.6 mg by mouth once.    [provider]  diphenoxylate-atropine (LOMOTIL) 2.5-0.025 MG tablet Take 1 tablet by mouth 4 (four) times daily as needed for diarrhea or loose stools.    [provider]  ezetimibe (ZETIA) 10 MG tablet Take 10 mg by mouth daily.   07/07/18   [provider]  fenofibrate 160 MG tablet Take 160 mg by mouth daily.     [provider]  Insulin Degludec (TRESIBA FLEXTOUCH) 200 UNIT/ML SOPN Inject 12 Units into the skin at bedtime.  05/17/18   [provider]  isosorbide mononitrate (IMDUR) 120 MG 24 hr tablet Take 120 mg by mouth daily.    [provider]  lansoprazole (PREVACID) 30 MG capsule Take 30 mg by mouth every morning.     [provider]  losartan (COZAAR) 25 MG tablet Take 25 mg by mouth daily.  08/29/18   [provider]  lovastatin (MEVACOR) 40 MG tablet Take 40 mg by mouth 2 (two) times daily.  07/01/18   [provider]  meclizine (ANTIVERT) 12.5 MG tablet Take 1 tablet (12.5 mg total) by mouth 3 (three) times daily as needed for dizziness. 04/05/19   Nena Polio, MD  metoprolol tartrate (LOPRESSOR) 100 MG tablet Take 1 tablet (100 mg total) by mouth 2 (two) times daily. 04/18/19   Patrecia Pour, MD  nitroGLYCERIN (NITROSTAT) 0.4 MG SL tablet Place 0.4 mg under the tongue every 5 (five) minutes as needed for chest pain.  07/01/18   [provider]  sodium bicarbonate 650 MG tablet Take 1 tablet by mouth daily.  12/31/18   Kolluru, Lurena Nida, MD  ticagrelor (BRILINTA) 60 MG TABS tablet Take 60 mg by mouth 2 (two) times daily.     [provider]     Critical care time:     Johnsie Cancel, NP-C [place] After hours pager: [phone]. 06/10/2019, 4:37 PM  Attending Note:  68 year old male s/p COVID induced hypoxemic respiratory failure who presents to PCCM for ARDS induced hypoxemic respiratory failure.  Patient came back to the ER for worsening hypoxemia and was placed on HFNC and BiPAP alternating.  On exam, he is alert and interactive, moving all ext to command.  I reviewed CXR myself, diffuse infiltrate noted. Discussed with PCCM-NP.  Acute hypoxemic respiratory failure:             - Titrate O2 for sat of  85-88%             - BiPAP for comfort             - Does not need intubation at this time  ARDS:             - Steroids  Acute pulmonary edema:             - Diureses as ordered             - Strict I/O  COVID:             - No isolation necessary             - No particular therapeutics necessary  PCCM will follow  Rush Farmer, M.D. [facility]Critical [facility] Medicine

## 2019-06-10 NOTE — TOC Benefit Eligibility Note (Signed)
Transition of Care [facility]) Benefit Eligibility Note    Patient Details  Name: Dylan Goodman MRN: [id number] Date of Birth: 14-Nov-1950   Medication/Dose: Alveda Reasons 15mg  qd  Covered?: Yes  Tier: 2 Drug  Prescription Coverage Preferred Pharmacy: [facility] with Person/Company/Phone Number:: [facility]  Co-Pay: 102.69 for 30 day retail  Prior Approval: No  Deductible: Unmet  Additional Notes: na    Delorse Lek Phone Number: 06/10/2019, 3:51 PM

## 2019-06-11 ENCOUNTER — Inpatient Hospital Stay (HOSPITAL_COMMUNITY): Payer: Medicare Other

## 2019-06-11 DIAGNOSIS — Z794 Long term (current) use of insulin: Secondary | ICD-10-CM

## 2019-06-11 DIAGNOSIS — E1122 Type 2 diabetes mellitus with diabetic chronic kidney disease: Secondary | ICD-10-CM

## 2019-06-11 LAB — BASIC METABOLIC PANEL
Anion gap: 15 (ref 5–15)
BUN: 69 mg/dL — ABNORMAL HIGH (ref 8–23)
CO2: 31 mmol/L (ref 22–32)
Calcium: 5.7 mg/dL — CL (ref 8.9–10.3)
Chloride: 95 mmol/L — ABNORMAL LOW (ref 98–111)
Creatinine, Ser: 2.76 mg/dL — ABNORMAL HIGH (ref 0.61–1.24)
GFR calc Af Amer: 27 mL/min — ABNORMAL LOW (ref >60)
GFR calc non Af Amer: 23 mL/min — ABNORMAL LOW (ref >60)
Glucose, Bld: 83 mg/dL (ref 70–99)
Potassium: 2.8 mmol/L — ABNORMAL LOW (ref 3.5–5.1)
Sodium: 141 mmol/L (ref 135–145)

## 2019-06-11 LAB — RENAL FUNCTION PANEL
Albumin: 2 g/dL — ABNORMAL LOW (ref 3.5–5.0)
Anion gap: 18 — ABNORMAL HIGH (ref 5–15)
BUN: 67 mg/dL — ABNORMAL HIGH (ref 8–23)
CO2: 30 mmol/L (ref 22–32)
Calcium: 6 mg/dL — CL (ref 8.9–10.3)
Chloride: 89 mmol/L — ABNORMAL LOW (ref 98–111)
Creatinine, Ser: 2.77 mg/dL — ABNORMAL HIGH (ref 0.61–1.24)
GFR calc Af Amer: 27 mL/min — ABNORMAL LOW (ref >60)
GFR calc non Af Amer: 23 mL/min — ABNORMAL LOW (ref >60)
Glucose, Bld: 290 mg/dL — ABNORMAL HIGH (ref 70–99)
Phosphorus: 2.4 mg/dL — ABNORMAL LOW (ref 2.5–4.6)
Potassium: 3.1 mmol/L — ABNORMAL LOW (ref 3.5–5.1)
Sodium: 137 mmol/L (ref 135–145)

## 2019-06-11 LAB — GLUCOSE, CAPILLARY
Glucose-Capillary: 47 mg/dL — ABNORMAL LOW (ref 70–99)
Glucose-Capillary: 71 mg/dL (ref 70–99)
Glucose-Capillary: 264 mg/dL — ABNORMAL HIGH (ref 70–99)
Glucose-Capillary: 258 mg/dL — ABNORMAL HIGH (ref 70–99)
Glucose-Capillary: 285 mg/dL — ABNORMAL HIGH (ref 70–99)

## 2019-06-11 LAB — VANCOMYCIN, PEAK: Vancomycin Pk: 56 ug/mL (ref 30–40)

## 2019-06-11 LAB — MAGNESIUM
Magnesium: 0.3 mg/dL — CL (ref 1.7–2.4)
Magnesium: 0.2 mg/dL — CL (ref 1.7–2.4)
Magnesium: 1 mg/dL — ABNORMAL LOW (ref 1.7–2.4)

## 2019-06-11 IMAGING — DX DG CHEST 1V PORT
1 series · 1 of 1 positions shown · non-contrast
Comparison: [DATE].

CLINICAL DATA: Shortness of breath.

EXAM:
PORTABLE CHEST 1 VIEW

[chest ap]
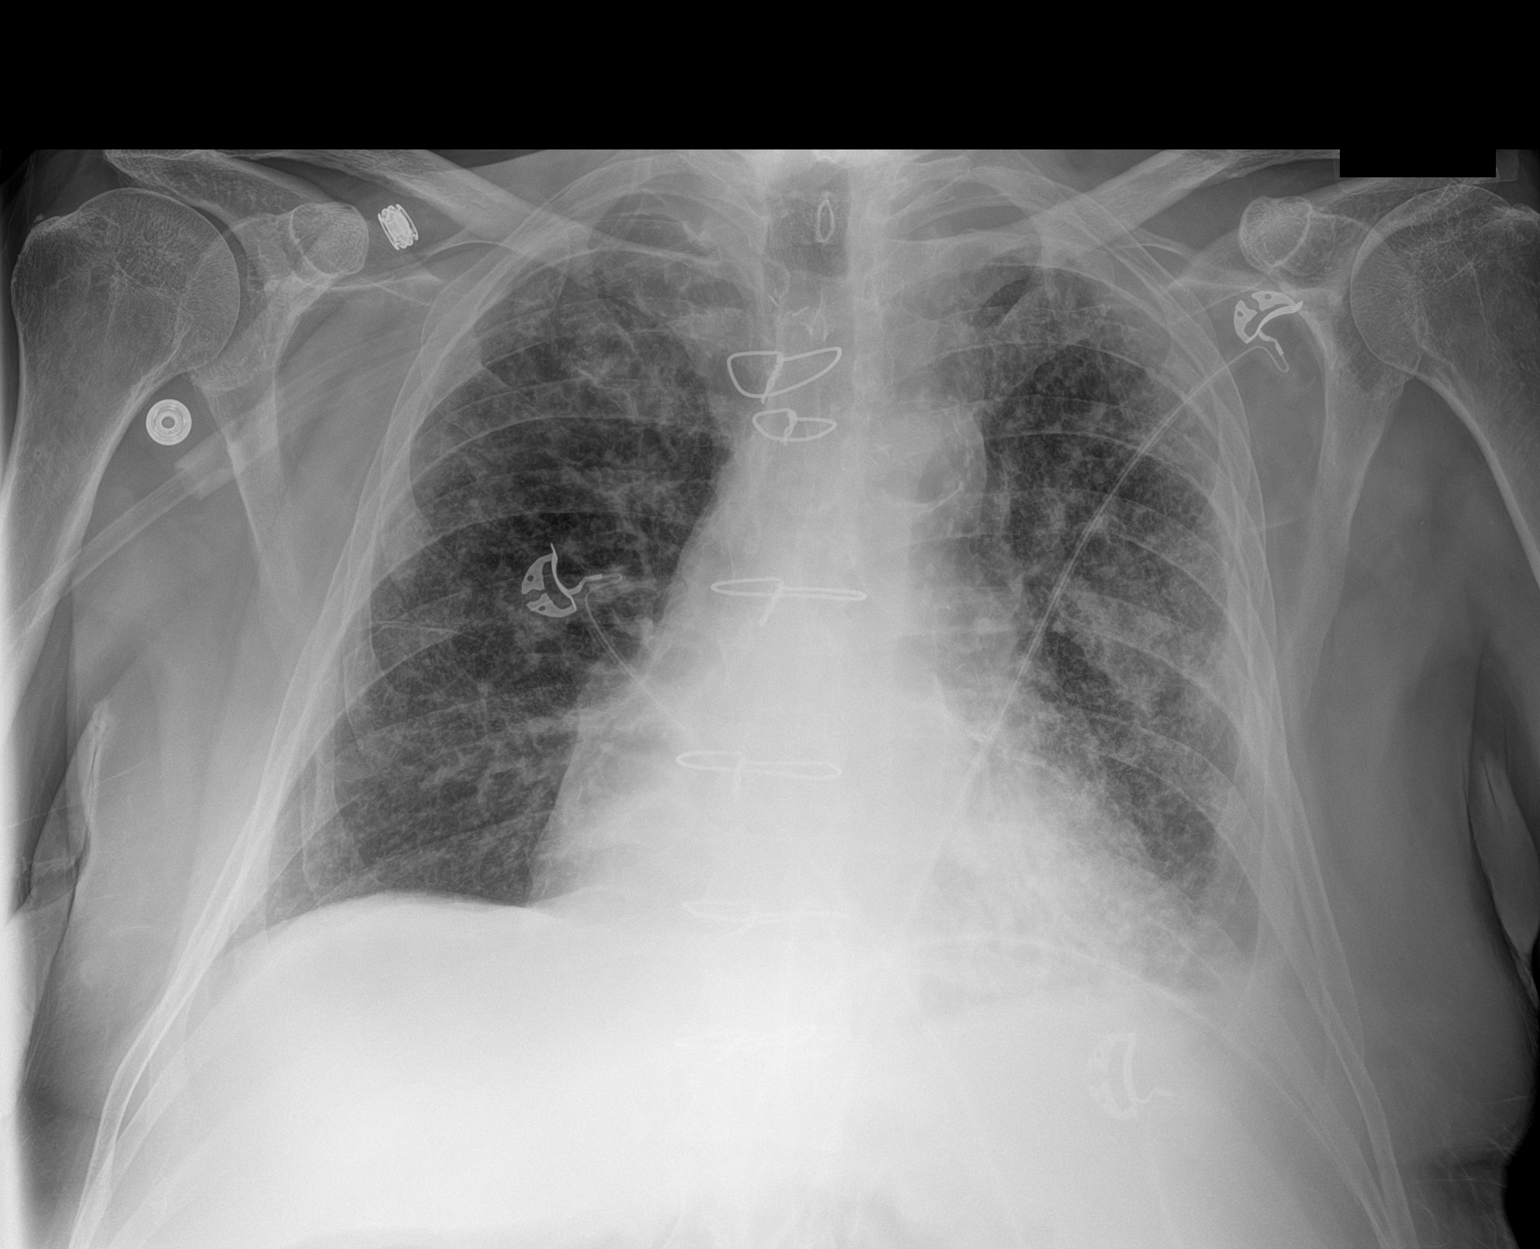

[1 of 1 positions shown; findings below may reference images not displayed]

FINDINGS: Stable cardiomegaly. Sternotomy wires are noted. No pneumothorax is
noted. Stable left basilar atelectasis or infiltrate is noted.
Stable left perihilar opacity is noted. Stable right interstitial
densities are noted. Bony thorax is unremarkable.
IMPRESSION: Stable bilateral lung opacities are noted, left greater than right.

## 2019-06-11 MED ORDER — CALCIUM GLUCONATE-NACL 2-0.675 GM/100ML-% IV SOLN
2.0000 g | Freq: Once | INTRAVENOUS | Status: AC
  Administered 2019-06-11: 2000 mg via INTRAVENOUS
  Filled 2019-06-11: qty 100

## 2019-06-11 MED ORDER — METHYLPREDNISOLONE SODIUM SUCC 125 MG IJ SOLR
60.0000 mg | Freq: Two times a day (BID) | INTRAMUSCULAR | Status: DC
  Administered 2019-06-12 (×2): 60 mg via INTRAVENOUS
  Filled 2019-06-11: qty 2

## 2019-06-11 MED ORDER — SODIUM CHLORIDE 0.9 % IV SOLN: 2.0000 g | Freq: Once | INTRAVENOUS | Status: DC

## 2019-06-11 MED ORDER — MAGNESIUM SULFATE 4 GM/100ML IV SOLN
4.0000 g | Freq: Once | INTRAVENOUS | Status: AC
  Administered 2019-06-11: 4 g via INTRAVENOUS
  Filled 2019-06-11: qty 100

## 2019-06-11 MED ORDER — METHYLPREDNISOLONE SODIUM SUCC 125 MG IJ SOLR
80.0000 mg | Freq: Two times a day (BID) | INTRAMUSCULAR | Status: DC
  Administered 2019-06-11: 80 mg via INTRAVENOUS
  Filled 2019-06-11: qty 2

## 2019-06-11 MED ORDER — INSULIN GLARGINE 100 UNIT/ML ~~LOC~~ SOLN
22.0000 [IU] | Freq: Every day | SUBCUTANEOUS | Status: DC
  Administered 2019-06-11 – 2019-06-13 (×3): 22 [IU] via SUBCUTANEOUS
  Filled 2019-06-11 (×4): qty 0.22

## 2019-06-11 MED ORDER — POTASSIUM CHLORIDE 10 MEQ/100ML IV SOLN
10.0000 meq | INTRAVENOUS | Status: AC
  Administered 2019-06-11 (×4): 10 meq via INTRAVENOUS
  Filled 2019-06-11 (×4): qty 100

## 2019-06-11 MED ORDER — POTASSIUM CHLORIDE CRYS ER 20 MEQ PO TBCR
30.0000 meq | EXTENDED_RELEASE_TABLET | ORAL | Status: AC
  Administered 2019-06-11 (×2): 30 meq via ORAL
  Filled 2019-06-11 (×3): qty 1

## 2019-06-11 MED ORDER — CALCIUM GLUCONATE-NACL 1-0.675 GM/50ML-% IV SOLN
1.0000 g | Freq: Once | INTRAVENOUS | Status: AC
  Administered 2019-06-11: 1000 mg via INTRAVENOUS
  Filled 2019-06-11: qty 50

## 2019-06-11 MED ORDER — POTASSIUM CHLORIDE CRYS ER 20 MEQ PO TBCR
40.0000 meq | EXTENDED_RELEASE_TABLET | ORAL | Status: AC
  Administered 2019-06-11 (×2): 40 meq via ORAL
  Filled 2019-06-11 (×2): qty 2

## 2019-06-11 MED ORDER — METOPROLOL SUCCINATE ER 50 MG PO TB24
200.0000 mg | ORAL_TABLET | Freq: Every day | ORAL | Status: DC
  Administered 2019-06-12 – 2019-06-14 (×3): 200 mg via ORAL
  Filled 2019-06-11 (×4): qty 2

## 2019-06-11 NOTE — Progress Notes (Signed)
Received critical labs CA 5.7, Mg 0.3 new lab sent for a repeat Mg. Notified NP on call she will place orders to be followed.  Cbg 47 patient requested juice repeat 71. Patient stable at this time.I will continue to monitor.

## 2019-06-11 NOTE — Progress Notes (Signed)
PROGRESS NOTE    Dylan Goodman  ZSW:[id number] DOB: [birth year] DOA: 06/19/2019 PCP: Steele Sizer, MD   Brief Narrative:  Patient is a 68 year old male with history of  atrial fibrillation, GI bleed, coronary artery disease status post CABG, nasopharyngeal cancer, cardiomyopathy, chronic depression, CKD stage 4, diabetes mellitus, GI bleed , peripheral vascular disease, previous smoker, recent Covid-19 infection, status post recent intubation/extubation in the setting of seizures who was sent from inpatient rehab for the evaluation of acute respiratory distress.  He was just discharged from here to inpatient rehab on 06/05/19.  He developed respiratory distress overnight and had to be kept on BiPAP.  He was admitted for the management of acute on chronic CHF exacerbation.he was also noted to have peripheral edema, diffuse Rales.  Chest x-ray concerning for pulmonary edema.  CT chest without contrast  showed possible multifocal pneumonia so he has been started on antibiotics.  He is also being followed by cardiology for new finding of worsening systolic heart failure and A. fib with RVR.Plan for cardioversion when respiratory status is more stable. He is currently on high flow oxygen.  Hospital course remarkable for very slow improvement in the respiratory status.  Nephrology following and he is on IV diuresis.I have also requested PCCM consult today.   Assessment & Plan:   Principal Problem:   Respiratory failure, acute ([place]) Active Problems:   CKD (chronic kidney disease), stage IV (HCC)   Coronary artery disease involving coronary bypass graft of native heart   IDA (iron deficiency anemia)   Type 2 diabetes mellitus with stage 4 chronic kidney disease, with long-term current use of insulin (HCC)   Metabolic acidosis   Atrial fibrillation with RVR (HCC)   Seizure (HCC)   Acute on chronic systolic (congestive) heart failure (HCC)   A/p 1)Acute on chronic respiratory failure:  Secondary to multifocal pneumonia and pulmonary edema-continue BiPAP nightly  2)MultiFocal PNA-respiratory symptoms and hypoxia persist, continue Vanco and cefepime  3)HFrEF--acute on chronic diastolic CHF exacerbation, echo this admission shows EF down to 20 to 25% with diffuse hypokinesis previous echo from 05/08/2019 showed EF of 40 to 45% -US guided thoracentesis of the right sided moderate pleural effusion with removal of 700 ml of clear fluid   4)A. fib with RVR: Hospital course remarkable for Afib with RVR  .  Cardiology following.  On Eliquis at home for anticoagulation .  Anticoagulation changed to Xarelto. Cardiology planning for DCCV when his respiratory status is more stable.   -Continue metoprolol and amiodarone for rate control  5)AKI on CKD stage 4:  Baseline creatinine around 2.5-3.  Nephrology following.  Continue diuresis -Creatinine currently around 2.7 -Discussed with nephrologist Dr. Joelyn Oms -Continue bicarb  6)Electrolyte abnormalities /Hypocalcemia / hypokalemia / Hypomagnesiemia---- magnesium was 0.2, potassium was 2.8,  calcium was 5.7--- replace and recheck  7)Seizure:  Continue Keppra.  Follow-up with neurology as an outpatient.  8)BPH with LUTS with urinary retention --- patient failed voiding trial, continue Foley in situ, continue Flomax  9)Insulin-dependent diabetic mellitus: Hemoglobin A1c of 6.7 in March 10, 2019.  On insulin at home.  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  PT/OT following   DVT prophylaxis: Xarelto Code Status: Full Family Communication: Discussed with wife  Disposition Plan: Undetermined at this point.  Back to back to CIR when medically stable  Consultants: Cardiology, nephrology  Procedures: None   Antimicrobials:  Anti-infectives (From admission, onward)   Start     Dose/Rate Route Frequency Ordered Stop   06/08/19 2156  ceFEPIme (MAXIPIME) 2 g in sodium chloride 0.9 % 100 mL IVPB     2 g 200 mL/hr over 30 Minutes Intravenous Every 24 hours 06/08/19 0836     06/08/19 1000  vancomycin (VANCOCIN) IVPB 1000 mg/200 mL premix     1,000 mg 200 mL/hr over 60 Minutes Intravenous Every 24 hours 06/07/19 1109     06/07/19 1115  ceFEPIme (MAXIPIME) 2 g in sodium chloride 0.9 % 100 mL IVPB  Status:  Discontinued     2 g 200 mL/hr over 30 Minutes Intravenous Every 12 hours 06/07/19 1109 06/08/19 0836   06/07/19 1115  vancomycin (VANCOCIN) 1,500 mg in sodium chloride 0.9 % 500 mL IVPB     1,500 mg 250 mL/hr over 120 Minutes Intravenous  Once 06/07/19 1109 06/07/19 1412      Subjective:  -Wife at bedside, questions answered, shortness of breath and oxygen requirement persist  Objective: Vitals:   06/11/19 0743 06/11/19 0745 06/11/19 0746 06/11/19 0753  BP: 95/65 112/71    Pulse: 89 85  85  Resp: (!) 24 (!) 26  (!) 26  Temp:      TempSrc:      SpO2:  93% 92% 97%  Weight:      Height:        Intake/Output Summary (Last 24 hours) at 06/11/2019 0806 Last data filed at 06/11/2019 0547 Gross per 24 hour  Intake 1177.77 ml  Output 2451 ml  Net -1273.23 ml   Filed Weights   06/09/19 0648 06/10/19 0500 06/11/19 0353  Weight: 86.6 kg 78.5 kg 81.2 kg    Examination:  General exam: Chronically ill-appearing HEENT:PERRL,Oral mucosa moist, [place] oxygen Respiratory system: Bilateral decreased air entry, very faint crackles on the bases Cardiovascular system: Irregularly irregular gastrointestinal system: Abdomen is nondistended, soft and nontender.  Normal bowel sounds heard. Central nervous system: Alert and oriented.  Generalized weakness, no focal neurological deficits. Extremities: Trace edema, no clubbing ,no cyanosis, distal peripheral pulses palpable. Skin: No rashes, lesions or  ulcers,no icterus ,no pallor GU-Foley catheter in situ   Data Reviewed:   CBC: Recent Labs  Lab 06/05/19 0349 06/11/2019 0501 06/07/19 0253 06/10/19 0349  WBC 8.0 13.4* 9.3 8.0  NEUTROABS  --  10.0* 7.5 6.1  HGB 10.5* 11.7* 11.4* 11.4*  HCT 34.6* 40.8 37.2* 35.2*  MCV 103.0* 107.1* 103.6* 96.2  PLT 127* 185 157 299*   Basic Metabolic Panel: Recent Labs  Lab 06/07/19 0253 06/08/19 0340 06/09/19 0420 06/10/19 0349 06/11/19 0411 06/11/19 0520  NA 141 142 141 136 141  --   K 4.4 3.4* 3.5 5.3* 2.8*  --   CL 106 107 104 94* 95*  --   CO2 18* 22 25 28 31   --   GLUCOSE 365* 136* 74 130* 83  --   BUN 51* 59* 64* 67* 69*  --   CREATININE 2.36* 2.71* 2.73* 2.78* 2.76*  --   CALCIUM 6.3* 6.4* 6.1* 5.6* 5.7*  --   MG  --   --   --   --  0.3* 0.2*   GFR: Estimated Creatinine Clearance: 28.4 mL/min (A) (by C-G formula based on SCr of 2.76 mg/dL (H)). Liver Function Tests: Recent Labs  Lab 06/11/2019 0501  AST 47*  ALT 43  ALKPHOS 269*  BILITOT 1.3*  PROT 4.9*  ALBUMIN 2.5*   No results for input(s): LIPASE, AMYLASE in the last 168 hours. No results for input(s): AMMONIA in the last 168 hours. Coagulation Profile: No results for input(s): INR, PROTIME in the last 168 hours. Cardiac Enzymes: No results for input(s): CKTOTAL, CKMB, CKMBINDEX, TROPONINI in the last 168 hours. BNP (last 3 results) No results for input(s): PROBNP in the last 8760 hours. HbA1C: No results for input(s): HGBA1C in the last 72 hours. CBG: Recent Labs  Lab 06/10/19 1110 06/10/19 1640 06/10/19 2123 06/11/19 0543 06/11/19 0603  GLUCAP 160* 251* 109* 47* 71   Lipid Profile: No results for input(s): CHOL, HDL, LDLCALC, TRIG, CHOLHDL, LDLDIRECT in the last 72 hours. Thyroid Function Tests: No results for input(s): TSH, T4TOTAL, FREET4, T3FREE, THYROIDAB in the last 72 hours. Anemia Panel: No results for input(s): VITAMINB12, FOLATE, FERRITIN, TIBC, IRON, RETICCTPCT in the last 72 hours.  Sepsis Labs: Recent Labs  Lab 06/01/2019 0501 06/18/2019 0930 06/07/19 0253  PROCALCITON 0.17  --  2.10  LATICACIDVEN 2.9* 1.8  --     Recent Results (from the past 240 hour(s))  Urine culture     Status: Abnormal   Collection Time: 06/05/19  5:45 PM   Specimen: Urine, Catheterized  Result Value Ref Range Status   Specimen Description URINE, CATHETERIZED  Final   Special Requests   Final    PATIENT ON FOLLOWING ZYVOX Performed at [facility], [place] [address], [place], [place] [address]    Culture 80,000 COLONIES/mL YEAST (A)  Final   Report Status 06/08/2019 FINAL  Final  Gram stain     Status: None   Collection Time: 06/07/19 11:22 AM   Specimen: Lung, Right; Pleural Fluid  Result Value Ref Range Status   Specimen Description PLEURAL RIGHT  Final   Special Requests NONE  Final   Gram Stain   Final    FEW WBC PRESENT,BOTH PMN AND MONONUCLEAR NO ORGANISMS SEEN Performed at [facility], [place] [address] [place], [place] [address]    Report Status 06/07/2019 FINAL  Final  Culture, body fluid-bottle     Status: None (Preliminary result)   Collection Time: 06/07/19 11:23 AM   Specimen: Pleura  Result Value Ref Range Status   Specimen Description PLEURAL RIGHT  Final   Special Requests NONE  Final   Culture   Final    NO GROWTH 4 DAYS Performed at [facility] [address], [place], [place] [address]    Report Status PENDING  Incomplete    Radiology Studies: Dg Chest 1 View  Result Date: 06/10/2019 CLINICAL DATA:  68 year old male with acute respiratory failure. EXAM: CHEST  1 VIEW COMPARISON:  Chest radiograph dated 06/07/2019. FINDINGS: Small left pleural effusion or pleural thickening similar to prior radiograph. There is diffuse interstitial coarsening and diffuse hazy airspace density in the left lung. Right lung interstitial coarsening. Overall no significant interval change in the appearance of the lungs since the prior radiograph. A tiny right  apical pneumothorax may be present. Attention on follow-up imaging recommended. Stable cardiomegaly. Median sternotomy wires. No acute osseous pathology. IMPRESSION: 1. No significant interval change in the appearance of the lungs since the prior radiograph. 2. Stable small left pleural effusion or pleural thickening. 3. Possible tiny right apical pneumothorax. Attention on follow-up imaging recommended. Electronically Signed   By: Anner Crete M.D.   On: 06/10/2019 09:52   Dg Chest Port 1 View  Result Date: 06/11/2019 CLINICAL  DATA:  Shortness of breath. EXAM: PORTABLE CHEST 1 VIEW COMPARISON:  June 10, 2019. FINDINGS: Stable cardiomegaly. Sternotomy wires are noted. No pneumothorax is noted. Stable left basilar atelectasis or infiltrate is noted. Stable left perihilar opacity is noted. Stable right interstitial densities are noted. Bony thorax is unremarkable. IMPRESSION: Stable bilateral lung opacities are noted, left greater than right. Electronically Signed   By: Marijo Conception M.D.   On: 06/11/2019 07:50   Scheduled Meds: . amiodarone  400 mg Oral BID  . calcitRIOL  0.25 mcg Oral Daily  . Chlorhexidine Gluconate Cloth  6 each Topical Daily  . furosemide  80 mg Intravenous Q8H  . Gerhardt's butt cream   Topical QID  . guaiFENesin  600 mg Oral BID  . insulin aspart  0-5 Units Subcutaneous QHS  . insulin aspart  0-9 Units Subcutaneous TID WC  . insulin glargine  25 Units Subcutaneous QHS  . ipratropium-albuterol  3 mL Inhalation BID  . lactobacillus acidophilus  2 tablet Oral TID  . levETIRAcetam  500 mg Oral BID  . Melatonin  6 mg Oral QHS  . metoprolol tartrate  50 mg Oral Q6H  . midodrine  5 mg Oral TID WC  . mometasone-formoterol  2 puff Inhalation BID  . pantoprazole  40 mg Oral Daily  . polyethylene glycol  17 g Oral Daily  . potassium chloride  30 mEq Oral Q4H  . [START ON 06/17/2019] predniSONE  10 mg Oral Q breakfast  . [START ON 06/14/2019] predniSONE  20 mg Oral Q  breakfast  . predniSONE  30 mg Oral Q breakfast  . [START ON 06/20/2019] predniSONE  5 mg Oral Q breakfast  . rivaroxaban  15 mg Oral Q supper  . sodium bicarbonate  1,300 mg Oral BID  . sodium chloride flush  10-40 mL Intracatheter Q12H  . sodium chloride flush  3 mL Intravenous Q12H  . tamsulosin  0.4 mg Oral Daily   Continuous Infusions: . sodium chloride 10 mL/hr at 06/10/19 1600  . calcium gluconate 1,000 mg (06/11/19 0739)  . ceFEPime (MAXIPIME) IV 2 g (06/10/19 2139)  . magnesium sulfate bolus IVPB 4 g (06/11/19 0740)  . vancomycin 200 mL/hr at 06/10/19 1600    LOS: 5 days   Roxan Hockey, MD [facility] If 7PM-7AM, please contact night-coverage [email or web address] Password [email or web address] 06/11/2019, 8:06 AM

## 2019-06-11 NOTE — Progress Notes (Signed)
CRITICAL VALUE ALERT  Critical Value:  Calcium  6.0  Date & Time Notied:  06/11/2019 at 4:15   Provider Notified: Roxan Hockey MD  Orders Received/Actions taken: Awaiting orders or further instructions.

## 2019-06-11 NOTE — Progress Notes (Signed)
Progress Note  Patient Name: Dylan Goodman Date of Encounter: 06/11/2019  Primary Cardiologist: Corey Skains, MD   Subjective   Stable, breathing unchanged. Has more of an appetite today. Did have brief spell when he was dizzy earlier today, but now improved.  Inpatient Medications    Scheduled Meds: . amiodarone  400 mg Oral BID  . calcitRIOL  0.25 mcg Oral Daily  . Chlorhexidine Gluconate Cloth  6 each Topical Daily  . furosemide  80 mg Intravenous Q8H  . Gerhardt's butt cream   Topical QID  . guaiFENesin  600 mg Oral BID  . insulin aspart  0-5 Units Subcutaneous QHS  . insulin aspart  0-9 Units Subcutaneous TID WC  . insulin glargine  22 Units Subcutaneous QHS  . ipratropium-albuterol  3 mL Inhalation BID  . lactobacillus acidophilus  2 tablet Oral TID  . levETIRAcetam  500 mg Oral BID  . Melatonin  6 mg Oral QHS  . metoprolol tartrate  50 mg Oral Q6H  . midodrine  5 mg Oral TID WC  . mometasone-formoterol  2 puff Inhalation BID  . pantoprazole  40 mg Oral Daily  . polyethylene glycol  17 g Oral Daily  . potassium chloride  30 mEq Oral Q4H  . [START ON 06/17/2019] predniSONE  10 mg Oral Q breakfast  . [START ON 06/14/2019] predniSONE  20 mg Oral Q breakfast  . predniSONE  30 mg Oral Q breakfast  . [START ON 06/20/2019] predniSONE  5 mg Oral Q breakfast  . rivaroxaban  15 mg Oral Q supper  . sodium bicarbonate  1,300 mg Oral BID  . sodium chloride flush  10-40 mL Intracatheter Q12H  . sodium chloride flush  3 mL Intravenous Q12H  . tamsulosin  0.4 mg Oral Daily   Continuous Infusions: . sodium chloride 10 mL/hr at 06/10/19 1600  . ceFEPime (MAXIPIME) IV 2 g (06/10/19 2139)  . magnesium sulfate bolus IVPB 4 g (06/11/19 0740)  . vancomycin 200 mL/hr at 06/10/19 1600   PRN Meds: sodium chloride, acetaminophen, alum & mag hydroxide-simeth, LORazepam, ondansetron (ZOFRAN) IV, sodium chloride flush, sodium chloride flush, zolpidem   Vital Signs     Vitals:   06/11/19 0743 06/11/19 0745 06/11/19 0746 06/11/19 0753  BP: 95/65 112/71    Pulse: 89 85  85  Resp: (!) 24 (!) 26  (!) 26  Temp:      TempSrc:      SpO2:  93% 92% 97%  Weight:      Height:        Intake/Output Summary (Last 24 hours) at 06/11/2019 0912 Last data filed at 06/11/2019 0547 Gross per 24 hour  Intake 1177.77 ml  Output 2451 ml  Net -1273.23 ml   Last 3 Weights 06/11/2019 06/10/2019 06/09/2019  Weight (lbs) 179 lb 173 lb 191 lb  Weight (kg) 81.194 kg 78.472 kg 86.637 kg      Telemetry    Atrial fibrillation, rate controlled- Personally Reviewed  ECG    06/03/2019 afib RVR with nonspecific ST changed - Personally Reviewed  Physical Exam   GEN: Well nourished, well developed in no acute distress HEENT: Normal, moist mucous membranes NECK: JVD at clavicle at 60 degrees CARDIAC: irregularly irregular rhythm, normal S1 and S2, no rubs or gallops. No murmurs. VASCULAR: Radial and DP pulses 2+ bilaterally. No carotid bruits RESPIRATORY:  Distant, coarse breath sounds ABDOMEN: Soft, non-tender, non-distended MUSCULOSKELETAL:  Ambulates independently SKIN: Warm and dry, no edema NEUROLOGIC:  Alert and oriented x 3. No focal neuro deficits noted. PSYCHIATRIC:  Normal affect   Labs    High Sensitivity Troponin:   Recent Labs  Lab 05/24/19 1630 05/24/19 1845 05/26/19 0650 05/27/19 0615  TROPONINIHS 261* 774* 2,883* 1,611*      Chemistry Recent Labs  Lab 06/14/2019 0501  06/09/19 0420 06/10/19 0349 06/11/19 0411  NA 143   < > 141 136 141  K 4.8   < > 3.5 5.3* 2.8*  CL 114*   < > 104 94* 95*  CO2 18*   < > 25 28 31   GLUCOSE 196*   < > 74 130* 83  BUN 47*   < > 64* 67* 69*  CREATININE 2.51*   < > 2.73* 2.78* 2.76*  CALCIUM 6.9*   < > 6.1* 5.6* 5.7*  PROT 4.9*  --   --   --   --   ALBUMIN 2.5*  --   --   --   --   AST 47*  --   --   --   --   ALT 43  --   --   --   --   ALKPHOS 269*  --   --   --   --   BILITOT 1.3*  --   --   --   --    GFRNONAA 26*   < > 23* NOT CALCULATED 23*  GFRAA 30*   < > 27* NOT CALCULATED 27*  ANIONGAP 11   < > 12 14 15    < > = values in this interval not displayed.     Hematology Recent Labs  Lab 06/25/2019 0501 06/07/19 0253 06/10/19 0349  WBC 13.4* 9.3 8.0  RBC 3.81* 3.59* 3.66*  HGB 11.7* 11.4* 11.4*  HCT 40.8 37.2* 35.2*  MCV 107.1* 103.6* 96.2  MCH 30.7 31.8 31.1  MCHC 28.7* 30.6 32.4  RDW 20.2* 19.8* 19.0*  PLT 185 157 147*    BNPNo results for input(s): BNP, PROBNP in the last 168 hours.   DDimer No results for input(s): DDIMER in the last 168 hours.   Radiology    Dg Chest 1 View  Result Date: 06/10/2019 CLINICAL DATA:  68 year old male with acute respiratory failure. EXAM: CHEST  1 VIEW COMPARISON:  Chest radiograph dated 06/07/2019. FINDINGS: Small left pleural effusion or pleural thickening similar to prior radiograph. There is diffuse interstitial coarsening and diffuse hazy airspace density in the left lung. Right lung interstitial coarsening. Overall no significant interval change in the appearance of the lungs since the prior radiograph. A tiny right apical pneumothorax may be present. Attention on follow-up imaging recommended. Stable cardiomegaly. Median sternotomy wires. No acute osseous pathology. IMPRESSION: 1. No significant interval change in the appearance of the lungs since the prior radiograph. 2. Stable small left pleural effusion or pleural thickening. 3. Possible tiny right apical pneumothorax. Attention on follow-up imaging recommended. Electronically Signed   By: Anner Crete M.D.   On: 06/10/2019 09:52   Dg Chest Port 1 View  Result Date: 06/11/2019 CLINICAL DATA:  Shortness of breath. EXAM: PORTABLE CHEST 1 VIEW COMPARISON:  June 10, 2019. FINDINGS: Stable cardiomegaly. Sternotomy wires are noted. No pneumothorax is noted. Stable left basilar atelectasis or infiltrate is noted. Stable left perihilar opacity is noted. Stable right interstitial  densities are noted. Bony thorax is unremarkable. IMPRESSION: Stable bilateral lung opacities are noted, left greater than right. Electronically Signed   By: Bobbe Medico.D.  On: 06/11/2019 07:50    Cardiac Studies   Echo 06/10/2019 1. Left ventricular ejection fraction, by visual estimation, is 20 to 25%. The left ventricle has severely decreased function. Left ventricular septal wall thickness was normal. There is no left ventricular hypertrophy.  2. Left ventricular diastolic parameters are indeterminate.  3. Moderately dilated left ventricular internal cavity size.  4. Diffuse hypokinesis EF has decreased since echo done 05/08/19.  5. Global right ventricle has normal systolic function.The right ventricular size is normal. No increase in right ventricular wall thickness.  6. Left atrial size was severely dilated.  7. Right atrial size was normal.  8. The mitral valve is normal in structure. Mild mitral valve regurgitation.  9. The tricuspid valve is normal in structure. Tricuspid valve regurgitation moderate. 10. The aortic valve is tricuspid. Aortic valve regurgitation is mild. Mild to moderate aortic valve sclerosis/calcification without any evidence of aortic stenosis. 11. The pulmonic valve was grossly normal. Pulmonic valve regurgitation is mild. 12. Moderately elevated pulmonary artery systolic pressure. 13. The interatrial septum was not well visualized.  Patient Profile     68 y.o. male with a hx of CAD s/p [id number] and PCI (details unclear), persistent atrial fibrillation,chronic systolic CHF, PAD (h/orevascularization stenting and fem-pop bypass grafting), carotid artery disease (chronic L ICAocclusion per notes), HTN, DM,chronic diarrhea, nasopharyngeal CA, anemia, gastric IONGE,XBM,[id number] PNAwith complications, CKD stage IV (recently requiring CRRT)who is being followed for the evaluation of worsening LV functionat the request of Dr. Tawanna Solo. He has multiple  recent hospitalizations, including for Covid 03/2019 and post cardiac arrest 10/6-10/29.  Assessment & Plan   Acute hypoxic respiratory failure, pleural effusions, possible multifocal pneumonia, recent Covid-19 PNA, acute on chronic systolic and diastolic heart failure with EF 20-25%: -s/p thoracentesis; R thora with 700cc clear yellow fluid 11/8 -diuresis per nephrology given renal failure, currently dosing 80 mg IV furosemide q8 hours with metolazone PRN -Cr elevated from recent nadir 2.28, has plateaued in the 2.7 range.   -on antibiotics for HCAP per primary team -PCCM consulted yesterday, steroids recommended, following. -worsening EF likely 2/2 afib RVR -most recent Covid swab negative -net negative 8.4 L per measured amounts, but weight trend variable (89.8 kg -> 83.5 kg -> 86.6 kg -> 78.5 kg -> 81.2 kg) -electrolytes have been very abnormal. Hypocalcemic, today very hypokalemic at 2.8, hypochloremic, very hypomagnesemic. Repletion per primary team/nephrology.  Atrial fibrillation with RVR:  -on rivaroxaban, tolerating. Dose is 15 mg given renal disease; if CrCl drops to <15, will need to adjust therapy. Started 11/7. Per consult note 11/7, would need full dose apixaban but reduced dose rivaroxaban, hence the change. -we discussed medical management vs. TEE-CV. He would like to avoid procedure if possible. With his respiratory status, would like to avoid TEE, which he agrees with. Has severe LA enlargement, unlikely to hold sinus without antiarrhythmic. If we can rate control, would continue to load with amio, keep on anticoagulation, then cardiovert only in 3 weeks. -changed to PO amiodarone load 06/10/19. Would load 400 mg BID for one week, then 200 mg BID for one week, then 200 mg daily.  -Encouraged him to exert himself as much as he is able with PT to determine if his heart rate will stay well controlled.  -consolidating metoprolol, to start tomorrow  CAD, with prior CABG: no acute  complaints for ischemia. With his multiple medical issues and severity of his current disease, would not check troponin unless he develops symptoms concerning for ACS. -restart lovastatin at  discharge, not on inpatient formulary.  Per primary/additional consulting teams: Acute on chronic kidney failure: baseline stage 4, required CRRT with recent admission Hypotension: chronic, ? 2/2 adrenal insufficiency, on midodrine and prednisone ESBL UTI with urinary retentions History of seiures, on Keppra Insulin-dependent type II diabetes  [facility] will sign off.   Medication Recommendations:  Amiodarone load as above, consolidating to metoprolol succinate 200 mg daily (replaced diltiazem), rivaroxaban 15 mg daily (replaces apixaban). Would not restart cilostazol at discharge given heart failure. Would restart home lovastatin at discharge. Other recommendations (labs, testing, etc):  none Follow up as an outpatient:  He follows with Dr. Nehemiah Massed. Would recommend he follow up within 2-3 weeks of discharge.  For questions or updates, please contact [place] Please consult [email or web address] for contact info under     Signed, Buford Dresser, MD  06/11/2019, 9:12 AM

## 2019-06-11 NOTE — Plan of Care (Signed)
  Problem: Health Behavior/Discharge Planning: Goal: Ability to manage health-related needs will improve Outcome: Progressing   Problem: Activity: Goal: Risk for activity intolerance will decrease Outcome: Progressing   Problem: Nutrition: Goal: Adequate nutrition will be maintained Outcome: Progressing   Problem: Coping: Goal: Level of anxiety will decrease Outcome: Progressing   Problem: Elimination: Goal: Will not experience complications related to bowel motility Outcome: Progressing Goal: Will not experience complications related to urinary retention Outcome: Progressing   Problem: Pain Managment: Goal: General experience of comfort will improve Outcome: Progressing   Problem: Safety: Goal: Ability to remain free from injury will improve Outcome: Progressing   Problem: Skin Integrity: Goal: Risk for impaired skin integrity will decrease Outcome: Progressing   Problem: Education: Goal: Ability to demonstrate management of disease process will improve Outcome: Progressing Goal: Ability to verbalize understanding of medication therapies will improve Outcome: Progressing Goal: Individualized Educational Video(s) Outcome: Progressing   Problem: Activity: Goal: Capacity to carry out activities will improve Outcome: Progressing   Problem: Cardiac: Goal: Ability to achieve and maintain adequate cardiopulmonary perfusion will improve Outcome: Progressing   

## 2019-06-11 NOTE — Progress Notes (Signed)
CRITICAL VALUE ALERT  Critical Value:  Vancomycin Peak 56  Date & Time Notied:  06/11/2019 1345  Provider Notified: Dr. Phineas Semen and Pharmacist  Orders Received/Actions taken: Advised to to get 06/12/2019 Vancomycin Trough level and do not given Vancomycin until lab value return for any adjustments needed - per Pharmacist.

## 2019-06-11 NOTE — Progress Notes (Signed)
F/u on Critical lab value  MD placed orders for replacement of Potassium, Mag and calcium from 1500 lab that was drawn.  Medications was started and given as ordered

## 2019-06-11 NOTE — Progress Notes (Signed)
Inpatient Diabetes Program Recommendations  AACE/ADA: New Consensus Statement on Inpatient Glycemic Control (2015)  Target Ranges:  Prepandial:   less than 140 mg/dL      Peak postprandial:   less than 180 mg/dL (1-2 hours)      Critically ill patients:  140 - 180 mg/dL   Lab Results  Component Value Date   GLUCAP 71 06/11/2019   HGBA1C 6.7 (H) 03/12/2019    Review of Glycemic Control Results for Dylan Goodman, Dylan Goodman (MRN [id number]) as of 06/11/2019 10:49  Ref. Range 06/10/2019 11:10 06/10/2019 16:40 06/10/2019 21:23 06/11/2019 05:43 06/11/2019 06:03  Glucose-Capillary Latest Ref Range: 70 - 99 mg/dL 160 (H) 251 (H) 109 (H) 47 (L) 71   Diabetes history: DM2 Outpatient Diabetes medications: Lantus 12 units + Novolog sensitive correction tid + hs Current orders for Inpatient glycemic control: Lantus 22 units qd + Novolog sensitive correction tid + hs 0-5 units  Inpatient Diabetes Program Recommendations:   Please consider: -Decrease Lantus to 12 units (home basal dose) -Increase Novolog correction to moderate tid + hs 0-5 units  Thank you, Bethena Roys E. Nitish Roes, RN, MSN, CDE  Diabetes Coordinator Inpatient Glycemic Control Team Team Pager [phone] (8am-5pm) 06/11/2019 10:55 AM

## 2019-06-11 NOTE — Progress Notes (Signed)
NAME:  Dylan Goodman, MRN:  [id number], DOB:  February 03, 1951, LOS: 5 ADMISSION DATE:  06/14/2019, CONSULTATION DATE:  10/7 REFERRING MD:  Dr Jimmye Norman [facility] EDP, CHIEF COMPLAINT:  Cardiac arrest   Brief History   68 year old male admitted with recent COVID pneumonia who was transferred from inpatient rehab due to acute respiratory distress. Originally thought to respiratory distress was secondary to CHF exacerbation but CT chest concerning for possible multifocal pneumonia therefore antibiotics were resumed.   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 Patient has had extensive hospitalizations over the last several months at several [place] facilities. Time line as follows;  -9/14-9/19 admitted to [facility] for COVID-19 pneumonia  -10/5 he presented to [facility] with complaints of chest pain. Cardiac workup was negative and he was felt to be dehydrated. He was given IVF and discharged home. -10/6 he called EMS due to dizziness, and upon their arrival he had a witnessed 2-minute tonic/clonic type seizure abated with versed 2mg . Upon arrival to ED he was awake, but confused. COVID Test again positive. -10/7 abruptly developed respiratory distress and tachycardia with the appearance of VT vs AF RVR. He lost pulses. He was defibrillated and CPR was started. Total downtime approximated at 11 mins. -10/19  CT chest done 10/19 revealing extensive patchy consolidation with ground glass opacity prominent in BUL suggestive of PNA, atelectasis or possible early post-inflammatory fibrosis. -10/21 due to lack of improvement in symptoms, decision was made to treat him again with Remdesivir and antibiotics.  He developed progressive confusion with agitation and was intubated -10/25-10/29 CRRT utilized -11/6 - 11/7 Admitted to [place]  program for rehab but developed respiratory distress with CXR showing increased pleural effusion on left, BIPAP required due to increased WOB. Transferred back to acute care.   During this admission he has received treatment for acute on chronic respiratory failure felt secondary to multifocal pneumonia and pulmonary edema with IV antibiotics, diuretics,  and US guided thoracentesis (741ml fluid removed).   PCCM consulted 11/11 due to continued high oxygen demand.   Past Medical History  COVID pneumonia  VRE and ESBL  Seizures Persistent A-fib PAD MI Hx of CABG GERD Type 2 diabetes  Hypertension CKD stage IV Chronic systolic CHF Cardiac arrest Adrenal insufficiency   Significant Hospital Events   11/7 Admitted   Consults:  PCCM  Procedures:  Right Thoracentesis with IR  11/8 >> 744ml removed   Significant Diagnostic Tests:  CT Chest 11/8 >> mixed interstitial opacities bilaterally, small to mod effusions bilaterally. CXR 11/8 >> stable, possible small right apical PTX CXR 11/12 > stable.  L > R opacities Echo 11/7 > EF 20-25%  Micro Data:  Blood cultures 11/8 >>  Pleural fluid 11/8 >>  Antimicrobials:  Vancomycin 11/8 >> Cefepime 11/8 >>  Interim history/subjective:  Resting comfortably.  On 15L HFNC.  Feels somewhat better.  Objective   Blood pressure 98/63, pulse 83, temperature 97.7 F (36.5 C), temperature source Oral, resp. rate (!) 26, height 5\' 11"  (1.803 m), weight 81.2 kg, SpO2 97 %.    FiO2 (%):  [70 %] 70 %   Intake/Output Summary (Last 24 hours) at 06/11/2019 1115 Last data filed at 06/11/2019 1041 Gross per 24 hour  Intake 1054.77 ml  Output 2451 ml  Net -1396.23 ml   Danley Danker  Weights   06/09/19 0648 06/10/19 0500 06/11/19 0353  Weight: 86.6 kg 78.5 kg 81.2 kg    Examination: General: Chronically ill appearing male, resting in bed, in NAD. Neuro: A&O x 3, no deficits. HEENT: [place]/AT. Sclerae anicteric. EOMI. Cardiovascular: RRR, no M/R/G.   Lungs: Respirations even and unlabored.  Coarse crackles. Abdomen: BS x 4, soft, NT/ND.  Musculoskeletal: No gross deformities, no edema.  Skin: Intact, warm, no rashes.  Assessment & Plan:   Acute on chronic respiratory failure - 2/2 COVID related ARDS / possible early fibrosis, pulmonary edema, pleural effusions (s/p right thora 11/8, transudative preresumed 2/2 above + CHF). - Continue supplemental O2 as needed to maintain SpO2 > 92%. - Continue BiPAP PRN. - Continue steroids. - Continue IV antibiotics until cultures resulted, if negative low threshold to discontinue. - Diureses as tolerated - neg balance noted (-8 L). - Encouraged OOB / mobilization with PT.  A.fib with RVR. - Per cardiology. - ? Whether amio is best option here given his pulmonary status and lung changes.  Will discuss with MD.  Rest per primary team   Best practice:  Diet: Heart healthy  Pain/Anxiety/Delirium protocol (if indicated):N/A VAP protocol (if indicated): N/A DVT prophylaxis: PO Xarelto  GI prophylaxis: PPI Glucose control: SSI Mobility: PT / OT Code Status: FULL Family Communication: Spouse present at bedside and updated regarding plan  Disposition: Progressive care    Montey Hora, [facility] 06/11/2019, 11:34 AM

## 2019-06-11 NOTE — Progress Notes (Signed)
[facility] NEPHROLOGY PROGRESS NOTE  Assessment/ Plan: Pt is a 68 y.o. yo male male with CHF, A. fib, DM, CKD with baseline creatinine around 2 admitted to [facility] from 9/14-9/19, readmitted on 10/6 status post cardiac arrest and suffered AKI on CKD required CRRT till 10/29.  #AKI on CKD, baseline creatinine around 2.  AKI likely ATN after cardiac arrest and concomitant with COVID-19 infection.  Initially treated with Lasix and then started CRRT from 10/24-10/29. -diuresing, cont TID Lasix at this dosing - likley will have new basleine GFR, near where we are  #HypoMg and HypoK: Likely diet and diuretic induced, repleted, f/u post repletion labs  #Metabolic acidosis: Serum Bicarbonate stable, continue to monitor.    #Acute hypoxic respiratory failure/pneumonia due to COVID-19 virus: Completed course of remdesivir and steroid.  Initially clinically improved.  PCCM following. TO restart steroids.   #Acute on chronic systolic CHF exacerbation: Diurese.  LVEF 20 to 25%.  #Hypotension/possible adrenal insufficiency: Midodrine.  Blood pressure is improved.  #ESBL UTI possible acute urinary retention: Failed trial of voiding, continue Foley until follow-up with urology.  Subjective:   PCCM following for persistent hypoxemia, remains HFNC  2.5L UOP yesterday, 1.3L net negative, weights labile  Stable SCr / GFR  Very low Mg and K this AM, confirmed, repleted by [facility]  Wife at bedside, updated  Has Foley back in  Objective Vital signs in last 24 hours: Vitals:   06/11/19 0745 06/11/19 0746 06/11/19 0753 06/11/19 1028  BP: 112/71   (!) 103/59  Pulse: 85  85 87  Resp: (!) 26  (!) 26   Temp:      TempSrc:      SpO2: 93% 92% 97%   Weight:      Height:       Weight change: 2.722 kg  Intake/Output Summary (Last 24 hours) at 06/11/2019 1537 Last data filed at 06/11/2019 1235 Gross per 24 hour  Intake 814.77 ml  Output 3401 ml  Net -2586.23 ml        Labs: Basic Metabolic Panel: Recent Labs  Lab 06/09/19 0420 06/10/19 0349 06/11/19 0411  NA 141 136 141  K 3.5 5.3* 2.8*  CL 104 94* 95*  CO2 25 28 31   GLUCOSE 74 130* 83  BUN 64* 67* 69*  CREATININE 2.73* 2.78* 2.76*  CALCIUM 6.1* 5.6* 5.7*   Liver Function Tests: Recent Labs  Lab 06/21/2019 0501  AST 47*  ALT 43  ALKPHOS 269*  BILITOT 1.3*  PROT 4.9*  ALBUMIN 2.5*   No results for input(s): LIPASE, AMYLASE in the last 168 hours. No results for input(s): AMMONIA in the last 168 hours. CBC: Recent Labs  Lab 06/05/19 0349 06/23/2019 0501 06/07/19 0253 06/10/19 0349  WBC 8.0 13.4* 9.3 8.0  NEUTROABS  --  10.0* 7.5 6.1  HGB 10.5* 11.7* 11.4* 11.4*  HCT 34.6* 40.8 37.2* 35.2*  MCV 103.0* 107.1* 103.6* 96.2  PLT 127* 185 157 147*   Cardiac Enzymes: No results for input(s): CKTOTAL, CKMB, CKMBINDEX, TROPONINI in the last 168 hours. CBG: Recent Labs  Lab 06/10/19 1640 06/10/19 2123 06/11/19 0543 06/11/19 0603 06/11/19 1146  GLUCAP 251* 109* 47* 71 264*    Iron Studies: No results for input(s): IRON, TIBC, TRANSFERRIN, FERRITIN in the last 72 hours. Studies/Results: Dg Chest 1 View  Result Date: 06/10/2019 CLINICAL DATA:  68 year old male with acute respiratory failure. EXAM: CHEST  1 VIEW COMPARISON:  Chest radiograph dated 06/07/2019. FINDINGS: Small left pleural effusion  or pleural thickening similar to prior radiograph. There is diffuse interstitial coarsening and diffuse hazy airspace density in the left lung. Right lung interstitial coarsening. Overall no significant interval change in the appearance of the lungs since the prior radiograph. A tiny right apical pneumothorax may be present. Attention on follow-up imaging recommended. Stable cardiomegaly. Median sternotomy wires. No acute osseous pathology. IMPRESSION: 1. No significant interval change in the appearance of the lungs since the prior radiograph. 2. Stable small left pleural effusion or  pleural thickening. 3. Possible tiny right apical pneumothorax. Attention on follow-up imaging recommended. Electronically Signed   By: Anner Crete M.D.   On: 06/10/2019 09:52   Dg Chest Port 1 View  Result Date: 06/11/2019 CLINICAL DATA:  Shortness of breath. EXAM: PORTABLE CHEST 1 VIEW COMPARISON:  June 10, 2019. FINDINGS: Stable cardiomegaly. Sternotomy wires are noted. No pneumothorax is noted. Stable left basilar atelectasis or infiltrate is noted. Stable left perihilar opacity is noted. Stable right interstitial densities are noted. Bony thorax is unremarkable. IMPRESSION: Stable bilateral lung opacities are noted, left greater than right. Electronically Signed   By: Marijo Conception M.D.   On: 06/11/2019 07:50    Medications: Infusions: . sodium chloride 10 mL/hr at 06/10/19 1600  . ceFEPime (MAXIPIME) IV 2 g (06/10/19 2139)  . vancomycin Stopped (06/11/19 1122)    Scheduled Medications: . amiodarone  400 mg Oral BID  . calcitRIOL  0.25 mcg Oral Daily  . Chlorhexidine Gluconate Cloth  6 each Topical Daily  . furosemide  80 mg Intravenous Q8H  . Gerhardt's butt cream   Topical QID  . guaiFENesin  600 mg Oral BID  . insulin aspart  0-5 Units Subcutaneous QHS  . insulin aspart  0-9 Units Subcutaneous TID WC  . insulin glargine  22 Units Subcutaneous QHS  . ipratropium-albuterol  3 mL Inhalation BID  . lactobacillus acidophilus  2 tablet Oral TID  . levETIRAcetam  500 mg Oral BID  . Melatonin  6 mg Oral QHS  . methylPREDNISolone (SOLU-MEDROL) injection  80 mg Intravenous Q12H  . [START ON 06/12/2019] metoprolol succinate  200 mg Oral Daily  . metoprolol tartrate  50 mg Oral Q6H  . midodrine  5 mg Oral TID WC  . mometasone-formoterol  2 puff Inhalation BID  . pantoprazole  40 mg Oral Daily  . polyethylene glycol  17 g Oral Daily  . rivaroxaban  15 mg Oral Q supper  . sodium bicarbonate  1,300 mg Oral BID  . sodium chloride flush  10-40 mL Intracatheter Q12H  . sodium  chloride flush  3 mL Intravenous Q12H  . tamsulosin  0.4 mg Oral Daily    have reviewed scheduled and prn medications.  Physical Exam: General: On nasal cannula, not in distress Heart:RRR, s1s2 nl, no rub Lungs: Bibasal crackles, no wheezing Abdomen:soft, Non-tender, non-distended Extremities: Trace lower extremity edema Dialysis Access: No HD catheter present GU: Foley  Darrelyn Morro B Danielly Ackerley 06/11/2019,3:37 PM  LOS: 5 days

## 2019-06-12 ENCOUNTER — Ambulatory Visit: Payer: Federal, State, Local not specified - PPO | Admitting: Family Medicine

## 2019-06-12 DIAGNOSIS — E872 Acidosis: Secondary | ICD-10-CM

## 2019-06-12 LAB — RENAL FUNCTION PANEL
Albumin: 2.1 g/dL — ABNORMAL LOW (ref 3.5–5.0)
Anion gap: 14 (ref 5–15)
BUN: 74 mg/dL — ABNORMAL HIGH (ref 8–23)
CO2: 32 mmol/L (ref 22–32)
Calcium: 6.9 mg/dL — ABNORMAL LOW (ref 8.9–10.3)
Chloride: 91 mmol/L — ABNORMAL LOW (ref 98–111)
Creatinine, Ser: 2.91 mg/dL — ABNORMAL HIGH (ref 0.61–1.24)
GFR calc Af Amer: 25 mL/min — ABNORMAL LOW (ref >60)
GFR calc non Af Amer: 22 mL/min — ABNORMAL LOW (ref >60)
Glucose, Bld: 256 mg/dL — ABNORMAL HIGH (ref 70–99)
Phosphorus: 2.2 mg/dL — ABNORMAL LOW (ref 2.5–4.6)
Potassium: 5.3 mmol/L — ABNORMAL HIGH (ref 3.5–5.1)
Sodium: 137 mmol/L (ref 135–145)

## 2019-06-12 LAB — GLUCOSE, CAPILLARY
Glucose-Capillary: 267 mg/dL — ABNORMAL HIGH (ref 70–99)
Glucose-Capillary: 289 mg/dL — ABNORMAL HIGH (ref 70–99)
Glucose-Capillary: 310 mg/dL — ABNORMAL HIGH (ref 70–99)
Glucose-Capillary: 282 mg/dL — ABNORMAL HIGH (ref 70–99)

## 2019-06-12 LAB — CULTURE, BODY FLUID W GRAM STAIN -BOTTLE: Culture: NO GROWTH

## 2019-06-12 LAB — MAGNESIUM: Magnesium: 1.8 mg/dL (ref 1.7–2.4)

## 2019-06-12 MED ORDER — METHYLPREDNISOLONE SODIUM SUCC 40 MG IJ SOLR
40.0000 mg | Freq: Two times a day (BID) | INTRAMUSCULAR | Status: DC
  Administered 2019-06-13 – 2019-06-14 (×3): 40 mg via INTRAVENOUS
  Filled 2019-06-12 (×3): qty 1

## 2019-06-12 MED ORDER — MAGNESIUM SULFATE 2 GM/50ML IV SOLN
2.0000 g | Freq: Once | INTRAVENOUS | Status: AC
  Administered 2019-06-12: 20:00:00 2 g via INTRAVENOUS
  Filled 2019-06-12: qty 50

## 2019-06-12 MED ORDER — MAGNESIUM SULFATE 2 GM/50ML IV SOLN
2.0000 g | Freq: Once | INTRAVENOUS | Status: AC
  Administered 2019-06-12: 2 g via INTRAVENOUS
  Filled 2019-06-12: qty 50

## 2019-06-12 NOTE — Progress Notes (Signed)
Pharmacy Antibiotic Note  Dylan Goodman is a 68 y.o. male admitted on 06/05/2019 CIR with respiratory distress.  WBC wnl, afebrile but Cxr shows pna, and PCT 2.1.   Pharmacy has been consulted for vancomycin and cefepime dosing for HCAP.  Currently on day #6 of antibiotics. Peak was 56 (elevated), trough was discontinued. WBC WNL, afebrile. SCr 2.91 (Crcl 27 ml/min). Cx showing no growth to date.   Plan: Discontinue vancomycin Continue cefepime 2g IV every 24 hours for total of 7 days  Height: 5\' 11"  (211.1 cm) Weight: 170 lb (77.1 kg) IBW/kg (Calculated) : 75.3  Temp (24hrs), Avg:97.6 F (36.4 C), Min:97 F (36.1 C), Max:98.2 F (36.8 C)  Recent Labs  Lab 06/23/2019 0501 06/28/2019 0930 06/07/19 0253  06/09/19 0420 06/10/19 0349 06/11/19 0411 06/11/19 1200 06/11/19 1500 06/12/19 0911  WBC 13.4*  --  9.3  --   --  8.0  --   --   --   --   CREATININE 2.51*  --  2.36*   < > 2.73* 2.78* 2.76*  --  2.77* 2.91*  LATICACIDVEN 2.9* 1.8  --   --   --   --   --   --   --   --   VANCOPEAK  --   --   --   --   --   --   --  56*  --   --    < > = values in this interval not displayed.    Estimated Creatinine Clearance: 27 mL/min (A) (by C-G formula based on SCr of 2.91 mg/dL (H)).    No Known Allergies  Antonietta Jewel, PharmD, BCCCP Clinical Pharmacist  Phone: [phone]  Please check AMION for all [place] phone numbers After 10:00 PM, call [place] [phone] 06/12/2019 10:59 AM

## 2019-06-12 NOTE — Progress Notes (Signed)
Physical Therapy Treatment Patient Details Name: Dylan Goodman MRN: [id number] DOB: 03-Mar-1951 Today's Date: 06/12/2019    History of Present Illness 68 year old male with history of  atrial fibrillation, GI bleed, coronary artery disease status post CABG, nasopharyngeal cancer, cardiomyopathy, chronic depression, CKD stage 4, diabetes mellitus, GI bleed , peripheral vascular disease, previous smoker, recent Covid-19 infection who was sent from inpatient rehab for the evaluation of acute respiratory distress.    PT Comments    Pt tolerated treatment well with only mild dizziness at edge of bed, BP remaining stable. Pt initiates transfer training with maxA from PT due to LE weakness. Pt demonstrates improved bed mobility, although remains limited due to pain in buttocks from rash/wounds. Pt will continue to benefit from acute PT services to reduce falls risk and improve functional mobility.   Follow Up Recommendations  CIR     Equipment Recommendations  (defer to post-acute)    Recommendations for Other Services       Precautions / Restrictions Precautions Precautions: Fall Precaution Comments: buttocks pain from wound Restrictions Weight Bearing Restrictions: No    Mobility  Bed Mobility Overal bed mobility: Needs Assistance Bed Mobility: Rolling;Sidelying to Sit Rolling: Mod assist Sidelying to sit: Min assist          Transfers Overall transfer level: Needs assistance Equipment used: Rolling walker (2 wheeled) Transfers: Sit to/from [facility] Sit to Stand: Max assist Stand pivot transfers: Max assist       General transfer comment: pt unable to clear buttocks on initial trial however coming to full stand on 2nd trial  Ambulation/Gait                 Stairs             Wheelchair Mobility    Modified Rankin (Stroke Patients Only)       Balance Overall balance assessment: Needs assistance Sitting-balance support:  Bilateral upper extremity supported;Feet supported Sitting balance-Leahy Scale: Fair Sitting balance - Comments: minG   Standing balance support: Bilateral upper extremity supported Standing balance-Leahy Scale: Poor Standing balance comment: modA with BUE support of RW in standing                            Cognition Arousal/Alertness: Awake/alert Behavior During Therapy: WFL for tasks assessed/performed Overall Cognitive Status: Within Functional Limits for tasks assessed                                        Exercises General Exercises - Lower Extremity Ankle Circles/Pumps: AROM;Both;10 reps    General Comments General comments (skin integrity, edema, etc.): pt on 15L HFNC, VSS with SpO2 ranging from 88-100% during mobility, BP also stable this session with mild reports of dizziness from patient      Pertinent Vitals/Pain Pain Assessment: Faces Faces Pain Scale: Hurts whole lot Pain Location: buttocks Pain Descriptors / Indicators: Grimacing Pain Intervention(s): Limited activity within patient's tolerance    Home Living                      Prior Function            PT Goals (current goals can now be found in the care plan section) Acute Rehab PT Goals Patient Stated Goal: to feel better Progress towards PT goals: Progressing toward goals  Frequency    Min 3X/week      PT Plan Current plan remains appropriate    Co-evaluation              AM-PAC PT "6 Clicks" Mobility   Outcome Measure  Help needed turning from your back to your side while in a flat bed without using bedrails?: A Lot Help needed moving from lying on your back to sitting on the side of a flat bed without using bedrails?: A Lot Help needed moving to and from a bed to a chair (including a wheelchair)?: Total Help needed standing up from a chair using your arms (e.g., wheelchair or bedside chair)?: Total Help needed to walk in hospital room?:  Total Help needed climbing 3-5 steps with a railing? : Total 6 Click Score: 8    End of Session Equipment Utilized During Treatment: Oxygen;Gait belt Activity Tolerance: Patient tolerated treatment well Patient left: in chair;with call bell/phone within reach Nurse Communication: Mobility status PT Visit Diagnosis: Unsteadiness on feet (R26.81);Other abnormalities of gait and mobility (R26.89);Muscle weakness (generalized) (M62.81);Difficulty in walking, not elsewhere classified (R26.2);Pain     Time: 3013-1438 PT Time Calculation (min) (ACUTE ONLY): 40 min  Charges:  $Therapeutic Activity: 38-52 mins                     Zenaida Niece, PT, DPT Acute Rehabilitation Pager: [phone]    Zenaida Niece 06/12/2019, 12:10 PM

## 2019-06-12 NOTE — Progress Notes (Signed)
[facility] NEPHROLOGY PROGRESS NOTE  Assessment/ Plan: Pt is a 68 y.o. yo male male with CHF, A. fib, DM, CKD with baseline creatinine around 2 admitted to [facility] from 9/14-9/19, readmitted on 10/6 status post cardiac arrest and suffered AKI on CKD required CRRT till 10/29.  #AKI on CKD, baseline creatinine around 2.  AKI likely ATN after cardiac arrest and concomitant with COVID-19 infection.  Initially treated with Lasix and then started CRRT from 10/24-10/29. -diuresing, cont TID Lasix at this dosing - likley will have new basleine GFR, near where we are  #HypoMg and HypoK: Severe on 11/12, aggressively repleted, stable today, CTM  #Metabolic acidosis: Serum bicarbonate 32, discontinue sodium bicarbonate.  CTM   #Acute hypoxic respiratory failure/pneumonia due to COVID-19 virus: Completed course of remdesivir and steroid.  Initially clinically improved.  PCCM following.   #Acute on chronic systolic CHF exacerbation: Continue to diurese.  LVEF 20 to 25%.  #Hypotension/possible adrenal insufficiency: Midodrine.  Blood pressure is improved.  #ESBL UTI possible acute urinary retention: Failed trial of voiding, continue Foley until follow-up with urology.  Subjective:   Stable overnight, remains on high flow nasal cannula  2.4 L urine output on 3 times daily Lasix  Potassium 5.3 after repletion, magnesium 1.8  Continues on broad-spectrum antibiotics  PCCM following for persistent hypoxemia, remains HFNC  Creatinine fairly stable at 2.9, BUN 74, bicarbonate 32  Objective Vital signs in last 24 hours: Vitals:   06/12/19 0800 06/12/19 0900 06/12/19 0920 06/12/19 0922  BP: 113/77     Pulse: 82     Resp: 18     Temp:  (!) 97.5 F (36.4 C)    TempSrc:  Oral    SpO2: 98%  92% (!) 88%  Weight:      Height:       Weight change: -4.082 kg  Intake/Output Summary (Last 24 hours) at 06/12/2019 1151 Last data filed at 06/12/2019 0900 Gross per 24  hour  Intake 730 ml  Output 2975 ml  Net -2245 ml       Labs: Basic Metabolic Panel: Recent Labs  Lab 06/11/19 0411 06/11/19 1500 06/12/19 0911  NA 141 137 137  K 2.8* 3.1* 5.3*  CL 95* 89* 91*  CO2 31 30 32  GLUCOSE 83 290* 256*  BUN 69* 67* 74*  CREATININE 2.76* 2.77* 2.91*  CALCIUM 5.7* 6.0* 6.9*  PHOS  --  2.4* 2.2*   Liver Function Tests: Recent Labs  Lab 06/20/2019 0501 06/11/19 1500 06/12/19 0911  AST 47*  --   --   ALT 43  --   --   ALKPHOS 269*  --   --   BILITOT 1.3*  --   --   PROT 4.9*  --   --   ALBUMIN 2.5* 2.0* 2.1*   No results for input(s): LIPASE, AMYLASE in the last 168 hours. No results for input(s): AMMONIA in the last 168 hours. CBC: Recent Labs  Lab 06/08/2019 0501 06/07/19 0253 06/10/19 0349  WBC 13.4* 9.3 8.0  NEUTROABS 10.0* 7.5 6.1  HGB 11.7* 11.4* 11.4*  HCT 40.8 37.2* 35.2*  MCV 107.1* 103.6* 96.2  PLT 185 157 147*   Cardiac Enzymes: No results for input(s): CKTOTAL, CKMB, CKMBINDEX, TROPONINI in the last 168 hours. CBG: Recent Labs  Lab 06/11/19 0603 06/11/19 1146 06/11/19 1646 06/11/19 2143 06/12/19 0621  GLUCAP 71 264* 258* 285* 267*    Iron Studies: No results for input(s): IRON, TIBC, TRANSFERRIN, FERRITIN in  the last 72 hours. Studies/Results: Dg Chest Port 1 View  Result Date: 06/11/2019 CLINICAL DATA:  Shortness of breath. EXAM: PORTABLE CHEST 1 VIEW COMPARISON:  June 10, 2019. FINDINGS: Stable cardiomegaly. Sternotomy wires are noted. No pneumothorax is noted. Stable left basilar atelectasis or infiltrate is noted. Stable left perihilar opacity is noted. Stable right interstitial densities are noted. Bony thorax is unremarkable. IMPRESSION: Stable bilateral lung opacities are noted, left greater than right. Electronically Signed   By: Marijo Conception M.D.   On: 06/11/2019 07:50    Medications: Infusions: . sodium chloride 10 mL/hr at 06/10/19 1600  . ceFEPime (MAXIPIME) IV 2 g (06/11/19 2213)  .  magnesium sulfate bolus IVPB 2 g (06/12/19 1124)    Scheduled Medications: . calcitRIOL  0.25 mcg Oral Daily  . Chlorhexidine Gluconate Cloth  6 each Topical Daily  . furosemide  80 mg Intravenous Q8H  . Gerhardt's butt cream   Topical QID  . guaiFENesin  600 mg Oral BID  . insulin aspart  0-5 Units Subcutaneous QHS  . insulin aspart  0-9 Units Subcutaneous TID WC  . insulin glargine  22 Units Subcutaneous QHS  . ipratropium-albuterol  3 mL Inhalation BID  . lactobacillus acidophilus  2 tablet Oral TID  . levETIRAcetam  500 mg Oral BID  . Melatonin  6 mg Oral QHS  . methylPREDNISolone (SOLU-MEDROL) injection  60 mg Intravenous Q12H  . metoprolol succinate  200 mg Oral Daily  . midodrine  5 mg Oral TID WC  . mometasone-formoterol  2 puff Inhalation BID  . pantoprazole  40 mg Oral Daily  . polyethylene glycol  17 g Oral Daily  . rivaroxaban  15 mg Oral Q supper  . sodium bicarbonate  1,300 mg Oral BID  . sodium chloride flush  10-40 mL Intracatheter Q12H  . sodium chloride flush  3 mL Intravenous Q12H  . tamsulosin  0.4 mg Oral Daily    have reviewed scheduled and prn medications.  Physical Exam: General: On nasal cannula, not in distress Heart:RRR, s1s2 nl, no rub Lungs: Bibasal crackles, no wheezing Abdomen:soft, Non-tender, non-distended Extremities: Trace lower extremity edema Dialysis Access: No HD catheter present GU: Foley  Ammie Warrick B Sherria Riemann 06/12/2019,11:51 AM  LOS: 6 days

## 2019-06-12 NOTE — Progress Notes (Signed)
PROGRESS NOTE    Dylan Goodman  VQQ:[id number] DOB: [birth year] DOA: 06/20/2019 PCP: Steele Sizer, MD   Brief Narrative:  Patient is a 68 year old male with history of  atrial fibrillation, GI bleed, coronary artery disease status post CABG, nasopharyngeal cancer, cardiomyopathy, chronic depression, CKD stage 4, diabetes mellitus, GI bleed , peripheral vascular disease, previous smoker, recent Covid-19 infection, status post recent intubation/extubation in the setting of seizures who was sent from inpatient rehab for the evaluation of acute respiratory distress.  He was just discharged from here to inpatient rehab on 06/05/19.  He developed respiratory distress overnight and had to be kept on BiPAP.  He was admitted for the management of acute on chronic CHF exacerbation.he was also noted to have peripheral edema, diffuse Rales.  Chest x-ray concerning for pulmonary edema.  CT chest without contrast  showed possible multifocal pneumonia so he has been started on antibiotics.  He is also being followed by cardiology for new finding of worsening systolic heart failure and A. fib with RVR.Plan for cardioversion when respiratory status is more stable. He is currently on high flow oxygen.  Hospital course remarkable for very slow improvement in the respiratory status.  Nephrology following and he is on IV diuresis.I have also requested PCCM consult    Assessment & Plan:   Principal Problem:   Respiratory failure, acute ([place]) Active Problems:   CKD (chronic kidney disease), stage IV (HCC)   Coronary artery disease involving coronary bypass graft of native heart   IDA (iron deficiency anemia)   Type 2 diabetes mellitus with stage 4 chronic kidney disease, with long-term current use of insulin (HCC)   Metabolic acidosis   Atrial fibrillation with RVR (HCC)   Seizure (HCC)   Acute on chronic systolic (congestive) heart failure (HCC)   A/p 1)Acute on chronic respiratory failure: Secondary  to multifocal pneumonia and pulmonary edema-continue BiPAP nightly -requiring oxygen at 15 L/min continuously -Decrease Solu-Medrol to 40 mg every 12 hours  2)MultiFocal PNA-respiratory symptoms and hypoxia persist,  -Continue cefepime  3)HFrEF--acute on chronic diastolic CHF exacerbation, echo this admission shows EF down to 20 to 25% with diffuse hypokinesis previous echo from 05/08/2019 showed EF of 40 to 45% -US guided thoracentesis of the right sided moderate pleural effusion with removal of 700 ml of clear fluid on 06/07/2019 -Continue IV Lasix 80 mg 3 times a day   4)A. fib with RVR: Hospital course remarkable for Afib with RVR  .  Cardiology input appreciated On Eliquis at home for anticoagulation .  Anticoagulation changed to Xarelto. Cardiology planning for DCCV when his respiratory status is more stable.   -Continue metoprolol and amiodarone for rate control  5)AKI on CKD stage 4:  Baseline creatinine around 2.5-3.  Nephrology following.  Continue diuresis -Creatinine currently around 2.7 -Discussed with nephrologist Dr. Joelyn Oms -Continue bicarb  6)Electrolyte abnormalities /Hypocalcemia / hypokalemia / Hypomagnesiemia----  --electrolyte derangement improving,  replace and recheck  7)Seizure:  Stable, continue Keppra.  Follow-up with neurology as an outpatient.  8)BPH with LUTS with urinary retention --- patient failed voiding trial, continue Foley in situ, continue Flomax  9)Insulin-dependent diabetic mellitus: Hemoglobin A1c of 6.7 in March 10, 2019.  On insulin at home.  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   DVT prophylaxis: Xarelto Code Status: Full Family Communication: Discussed with wife  Disposition Plan:     Back to back to CIR when  medically stable  Consultants: Cardiology, nephrology  Procedures: None  Antimicrobials:  Anti-infectives (From admission, onward)   Start     Dose/Rate Route Frequency Ordered Stop   06/08/19 2156  ceFEPIme (MAXIPIME) 2 g in sodium chloride 0.9 % 100 mL IVPB     2 g 200 mL/hr over 30 Minutes Intravenous Every 24 hours 06/08/19 0836 06/14/19 2159   06/08/19 1000  vancomycin (VANCOCIN) IVPB 1000 mg/200 mL premix  Status:  Discontinued     1,000 mg 200 mL/hr over 60 Minutes Intravenous Every 24 hours 06/07/19 1109 06/12/19 1042   06/07/19 1115  ceFEPIme (MAXIPIME) 2 g in sodium chloride 0.9 % 100 mL IVPB  Status:  Discontinued     2 g 200 mL/hr over 30 Minutes Intravenous Every 12 hours 06/07/19 1109 06/08/19 0836   06/07/19 1115  vancomycin (VANCOCIN) 1,500 mg in sodium chloride 0.9 % 500 mL IVPB     1,500 mg 250 mL/hr over 120 Minutes Intravenous  Once 06/07/19 1109 06/07/19 1412      Subjective: -Shortness of breath and cough persist -Continues to require very high flow oxygen -Urine output is adequate  Objective: Vitals:   06/12/19 0920 06/12/19 0922 06/12/19 1038 06/12/19 1617  BP:   112/67 109/69  Pulse:   80   Resp:   (!) 23   Temp:    98.6 F (37 C)  TempSrc:    Oral  SpO2: 92% (!) 88% 91%   Weight:      Height:        Intake/Output Summary (Last 24 hours) at 06/12/2019 1831 Last data filed at 06/12/2019 1500 Gross per 24 hour  Intake 450 ml  Output 2625 ml  Net -2175 ml   Filed Weights   06/10/19 0500 06/11/19 0353 06/12/19 6546  Weight: 78.5 kg 81.2 kg 77.1 kg    Examination:  General exam: Chronically ill-appearing HEENT:PERRL,Oral mucosa moist,  Nose- [place] oxygen 15L  Respiratory system: Bilateral decreased air entry, very faint crackles on the bases Cardiovascular system: Irregularly irregular, CABG scar gastrointestinal system: Abdomen is nondistended, soft and nontender.  Normal bowel sounds heard. -Prior laparotomy scars Central nervous  system: Alert and oriented.  Generalized weakness, no focal neurological deficits. Extremities: Trace edema, no clubbing ,no cyanosis, distal peripheral pulses palpable. Skin: No rashes, lesions or ulcers,no icterus ,no pallor GU-Foley catheter in situ   Data Reviewed:   CBC: Recent Labs  Lab 06/14/2019 0501 06/07/19 0253 06/10/19 0349  WBC 13.4* 9.3 8.0  NEUTROABS 10.0* 7.5 6.1  HGB 11.7* 11.4* 11.4*  HCT 40.8 37.2* 35.2*  MCV 107.1* 103.6* 96.2  PLT 185 157 503*   Basic Metabolic Panel: Recent Labs  Lab 06/09/19 0420 06/10/19 0349 06/11/19 0411 06/11/19 0520 06/11/19 1500 06/12/19 0911  NA 141 136 141  --  137 137  K 3.5 5.3* 2.8*  --  3.1* 5.3*  CL 104 94* 95*  --  89* 91*  CO2 25 28 31   --  30 32  GLUCOSE 74 130* 83  --  290* 256*  BUN 64* 67* 69*  --  67* 74*  CREATININE 2.73* 2.78* 2.76*  --  2.77* 2.91*  CALCIUM 6.1* 5.6* 5.7*  --  6.0* 6.9*  MG  --   --  0.3* 0.2* 1.0* 1.8  PHOS  --   --   --   --  2.4* 2.2*   GFR: Estimated Creatinine  Clearance: 27 mL/min (A) (by C-G formula based on SCr of 2.91 mg/dL (H)). Liver Function Tests: Recent Labs  Lab 06/23/2019 0501 06/11/19 1500 06/12/19 0911  AST 47*  --   --   ALT 43  --   --   ALKPHOS 269*  --   --   BILITOT 1.3*  --   --   PROT 4.9*  --   --   ALBUMIN 2.5* 2.0* 2.1*   No results for input(s): LIPASE, AMYLASE in the last 168 hours. No results for input(s): AMMONIA in the last 168 hours. Coagulation Profile: No results for input(s): INR, PROTIME in the last 168 hours. Cardiac Enzymes: No results for input(s): CKTOTAL, CKMB, CKMBINDEX, TROPONINI in the last 168 hours. BNP (last 3 results) No results for input(s): PROBNP in the last 8760 hours. HbA1C: No results for input(s): HGBA1C in the last 72 hours. CBG: Recent Labs  Lab 06/11/19 1646 06/11/19 2143 06/12/19 0621 06/12/19 1143 06/12/19 1619  GLUCAP 258* 285* 267* 289* 310*   Lipid Profile: No results for input(s): CHOL, HDL, LDLCALC,  TRIG, CHOLHDL, LDLDIRECT in the last 72 hours. Thyroid Function Tests: No results for input(s): TSH, T4TOTAL, FREET4, T3FREE, THYROIDAB in the last 72 hours. Anemia Panel: No results for input(s): VITAMINB12, FOLATE, FERRITIN, TIBC, IRON, RETICCTPCT in the last 72 hours. Sepsis Labs: Recent Labs  Lab 06/23/2019 0501 05/31/2019 0930 06/07/19 0253  PROCALCITON 0.17  --  2.10  LATICACIDVEN 2.9* 1.8  --     Recent Results (from the past 240 hour(s))  Urine culture     Status: Abnormal   Collection Time: 06/05/19  5:45 PM   Specimen: Urine, Catheterized  Result Value Ref Range Status   Specimen Description URINE, CATHETERIZED  Final   Special Requests   Final    PATIENT ON FOLLOWING ZYVOX Performed at [facility], [place] [address], [place], [place] [address]    Culture 80,000 COLONIES/mL YEAST (A)  Final   Report Status 06/27/2019 FINAL  Final  Gram stain     Status: None   Collection Time: 06/07/19 11:22 AM   Specimen: Lung, Right; Pleural Fluid  Result Value Ref Range Status   Specimen Description PLEURAL RIGHT  Final   Special Requests NONE  Final   Gram Stain   Final    FEW WBC PRESENT,BOTH PMN AND MONONUCLEAR NO ORGANISMS SEEN Performed at [facility], [place] [address], [place], [place] [address]    Report Status 06/07/2019 FINAL  Final  Culture, body fluid-bottle     Status: None   Collection Time: 06/07/19 11:23 AM   Specimen: Pleura  Result Value Ref Range Status   Specimen Description PLEURAL RIGHT  Final   Special Requests NONE  Final   Culture   Final    NO GROWTH 5 DAYS Performed at [place] [address], [place], [place] [address]    Report Status 06/12/2019 FINAL  Final    Radiology Studies: Dg Chest Port 1 View  Result Date: 06/11/2019 CLINICAL DATA:  Shortness of breath. EXAM: PORTABLE CHEST 1 VIEW COMPARISON:  June 10, 2019. FINDINGS: Stable cardiomegaly. Sternotomy wires are noted. No pneumothorax is noted. Stable left basilar  atelectasis or infiltrate is noted. Stable left perihilar opacity is noted. Stable right interstitial densities are noted. Bony thorax is unremarkable. IMPRESSION: Stable bilateral lung opacities are noted, left greater than right. Electronically Signed   By: Marijo Conception M.D.   On: 06/11/2019 07:50  Scheduled Meds: . calcitRIOL  0.25 mcg Oral Daily  . Chlorhexidine Gluconate Cloth  6 each Topical Daily  . furosemide  80 mg Intravenous Q8H  . Gerhardt's butt cream   Topical QID  . guaiFENesin  600 mg Oral BID  . insulin aspart  0-5 Units Subcutaneous QHS  . insulin aspart  0-9 Units Subcutaneous TID WC  . insulin glargine  22 Units Subcutaneous QHS  . ipratropium-albuterol  3 mL Inhalation BID  . lactobacillus acidophilus  2 tablet Oral TID  . levETIRAcetam  500 mg Oral BID  . Melatonin  6 mg Oral QHS  . methylPREDNISolone (SOLU-MEDROL) injection  60 mg Intravenous Q12H  . metoprolol succinate  200 mg Oral Daily  . midodrine  5 mg Oral TID WC  . mometasone-formoterol  2 puff Inhalation BID  . pantoprazole  40 mg Oral Daily  . polyethylene glycol  17 g Oral Daily  . rivaroxaban  15 mg Oral Q supper  . sodium chloride flush  10-40 mL Intracatheter Q12H  . sodium chloride flush  3 mL Intravenous Q12H  . tamsulosin  0.4 mg Oral Daily   Continuous Infusions: . sodium chloride 10 mL/hr at 06/10/19 1600  . ceFEPime (MAXIPIME) IV 2 g (06/11/19 2213)  . magnesium sulfate bolus IVPB      LOS: 6 days   Roxan Hockey, MD [facility] If 7PM-7AM, please contact night-coverage [email or web address] Password [facility] 06/12/2019, 6:31 PM

## 2019-06-12 NOTE — Progress Notes (Signed)
Physical Therapy Treatment Patient Details Name: Dylan Goodman MRN: [id number] DOB: 16-Nov-1950 Today's Date: 06/12/2019    History of Present Illness 68 year old male with history of  atrial fibrillation, GI bleed, coronary artery disease status post CABG, nasopharyngeal cancer, cardiomyopathy, chronic depression, CKD stage 4, diabetes mellitus, GI bleed , peripheral vascular disease, previous smoker, recent Covid-19 infection who was sent from inpatient rehab for the evaluation of acute respiratory distress.    PT Comments    Nurse approached PT to assist with transferring a pt back to bed as they were unsuccessful and pt was sitting in stool. Requires Max A of 2 for squat pivot transfer to chair supporting bil knees and using chuck pad for support. Pt was fatigued from sitting in chair most of day and not able to assist much. Also noted to have pain in buttocks. VSS during transfer. Will continue to follow and progress as tolerated.   Follow Up Recommendations  CIR     Equipment Recommendations  Other (comment)(defer)    Recommendations for Other Services       Precautions / Restrictions Precautions Precautions: Fall Precaution Comments: buttocks pain from wound Restrictions Weight Bearing Restrictions: No    Mobility  Bed Mobility   Bed Mobility: Sit to Supine       Sit to supine: Max assist;+2 for physical assistance;HOB elevated   General bed mobility comments: Assist of 2 to bring LEs into bed and lower trunk; fatigued from sitting in chair.  Transfers Overall transfer level: Needs assistance Equipment used: 2 person hand held assist Transfers: Squat Pivot Transfers     Squat pivot transfers: Max assist;+2 physical assistance     General transfer comment: Max A to squat pivot transfer to chair using chuck pad and blocking bil knees with drop arm recliner to the right. Pt very fatigued from sitting up in chair and not able to help  much.  Ambulation/Gait                 Stairs             Wheelchair Mobility    Modified Rankin (Stroke Patients Only)       Balance Overall balance assessment: Needs assistance Sitting-balance support: Feet supported;No upper extremity supported Sitting balance-Leahy Scale: Fair                                      Cognition Arousal/Alertness: Awake/alert Behavior During Therapy: WFL for tasks assessed/performed Overall Cognitive Status: Within Functional Limits for tasks assessed                                 General Comments: appears WFL ; likes to know what is happening step by step.      Exercises      General Comments        Pertinent Vitals/Pain Pain Assessment: Faces Faces Pain Scale: Hurts whole lot Pain Location: buttocks Pain Descriptors / Indicators: Grimacing Pain Intervention(s): Monitored during session;Repositioned    Home Living                      Prior Function            PT Goals (current goals can now be found in the care plan section) Progress towards PT goals: Progressing toward goals    Frequency  Min 3X/week      PT Plan Current plan remains appropriate    Co-evaluation              AM-PAC PT "6 Clicks" Mobility   Outcome Measure  Help needed turning from your back to your side while in a flat bed without using bedrails?: A Lot Help needed moving from lying on your back to sitting on the side of a flat bed without using bedrails?: Total Help needed moving to and from a bed to a chair (including a wheelchair)?: Total Help needed standing up from a chair using your arms (e.g., wheelchair or bedside chair)?: Total Help needed to walk in hospital room?: Total Help needed climbing 3-5 steps with a railing? : Total 6 Click Score: 7    End of Session Equipment Utilized During Treatment: Oxygen;Gait belt Activity Tolerance: Patient tolerated treatment  well Patient left: in bed;with call bell/phone within reach;with family/visitor present;Other (comment)(RN present in room) Nurse Communication: Mobility status PT Visit Diagnosis: Unsteadiness on feet (R26.81);Other abnormalities of gait and mobility (R26.89);Muscle weakness (generalized) (M62.81);Difficulty in walking, not elsewhere classified (R26.2);Pain Pain - part of body: (buttocks)     Time: 3299-2426 PT Time Calculation (min) (ACUTE ONLY): 15 min  Charges:  $Therapeutic Activity: 8-22 mins                     Marisa Severin, PT, DPT Acute Rehabilitation Services Pager [phone] Office [phone]       Dylan Goodman 06/12/2019, 4:18 PM

## 2019-06-12 NOTE — Progress Notes (Signed)
Pt BiPAP on stand by at this time at bedside. Pt on 15L [place] tolerating well at this time. RT will continue to monitor.

## 2019-06-12 NOTE — Progress Notes (Signed)
NAME:  Dylan Goodman, MRN:  [id number], DOB:  [birth year], LOS: 6 ADMISSION DATE:  06/27/2019, CONSULTATION DATE:  10/7 REFERRING MD:  Dr Jimmye Norman [facility] EDP, CHIEF COMPLAINT:  Cardiac arrest   Brief History   68 year old male admitted with recent COVID pneumonia who was transferred from inpatient rehab due to acute respiratory distress. Originally thought to respiratory distress was secondary to CHF exacerbation but CT chest concerning for possible multifocal pneumonia therefore antibiotics were resumed.   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 Patient has had extensive hospitalizations over the last several months at several [place] facilities. Time line as follows;  -9/14-9/19 admitted to [facility] for COVID-19 pneumonia  -10/5 he presented to [facility] with complaints of chest pain. Cardiac workup was negative and he was felt to be dehydrated. He was given IVF and discharged home. -10/6 he called EMS due to dizziness, and upon their arrival he had a witnessed 2-minute tonic/clonic type seizure abated with versed 2mg . Upon arrival to ED he was awake, but confused. COVID Test again positive. -10/7 abruptly developed respiratory distress and tachycardia with the appearance of VT vs AF RVR. He lost pulses. He was defibrillated and CPR was started. Total downtime approximated at 11 mins. -10/19  CT chest done 10/19 revealing extensive patchy consolidation with ground glass opacity prominent in BUL suggestive of PNA, atelectasis or possible early post-inflammatory fibrosis. -10/21 due to lack of improvement in symptoms, decision was made to treat him again with Remdesivir and antibiotics.  He developed progressive confusion with agitation and was intubated -10/25-10/29 CRRT utilized -11/6 - 11/7 Admitted to [facility]  program for rehab but developed respiratory distress with CXR showing increased pleural effusion on left, BIPAP required due to increased WOB. Transferred back to acute care.   During this admission he has received treatment for acute on chronic respiratory failure felt secondary to multifocal pneumonia and pulmonary edema with IV antibiotics, diuretics,  and US guided thoracentesis (7104ml fluid removed).   PCCM consulted 11/11 due to continued high oxygen demand.   Past Medical History  COVID pneumonia  VRE and ESBL  Seizures Persistent A-fib PAD MI Hx of CABG GERD Type 2 diabetes  Hypertension CKD stage IV Chronic systolic CHF Cardiac arrest Adrenal insufficiency   Significant Hospital Events   11/7 Admitted   Consults:  PCCM  Procedures:  Right Thoracentesis with IR  11/8 >> 773ml removed   Significant Diagnostic Tests:  CT Chest 11/8 >> mixed interstitial opacities bilaterally, small to mod effusions bilaterally. CXR 11/8 >> stable, possible small right apical PTX CXR 11/12 > stable.  L > R opacities Echo 11/7 > EF 20-25%  Micro Data:  Blood cultures 11/8 >>  Pleural fluid 11/8 >>  Antimicrobials:  Vancomycin 11/8 >> Cefepime 11/8 >>  Interim history/subjective:  Sleepy this AM after getting a bath.  Remains on 15L HFNC. -10.L net  Objective   Blood pressure 113/77, pulse 82, temperature (!) 97.5 F (36.4 C), temperature source Oral, resp. rate 18, height 5\' 11"  (1.803 m), weight 77.1 kg, SpO2 (!) 88 %.        Intake/Output Summary (Last 24 hours) at 06/12/2019 0957 Last data filed at 06/12/2019 0900 Gross per 24 hour  Intake 733 ml  Output 2975 ml  Net -2242 ml   Filed  Weights   06/10/19 0500 06/11/19 0353 06/12/19 0632  Weight: 78.5 kg 81.2 kg 77.1 kg    Examination: General: Chronically ill appearing male, resting in bed, in NAD. Neuro: A&O x 3, no deficits. HEENT: [place]/AT. Sclerae anicteric. EOMI. Cardiovascular: RRR, no M/R/G.  Lungs:  Respirations even and unlabored.  Coarse crackles. Abdomen: BS x 4, soft, NT/ND.  Musculoskeletal: No gross deformities, no edema.  Skin: Intact, warm, no rashes.  Assessment & Plan:   Acute on chronic respiratory failure - 2/2 COVID related ARDS / possible early fibrosis, pulmonary edema, pleural effusions (s/p right thora 11/8, transudative preresumed 2/2 above + CHF). ? amio toxicity. - Continue supplemental O2 as needed to maintain SpO2 > 92%. - Continue BiPAP PRN. - Continue steroids, changed to solumedrol 80mg  q12hr. - Continue IV antibiotics until cultures resulted, if negative low threshold to discontinue. - Diureses as tolerated - neg balance noted (-10.8 L). - Encouraged OOB / mobilization with PT. - See below re amio.  A.fib with RVR - Per cardiology. - ? Whether amio is best option here given his pulmonary status and lung changes.  If able, would prefer different agent.  Rest per primary team   Best practice:  Diet: Heart healthy  Pain/Anxiety/Delirium protocol (if indicated):N/A VAP protocol (if indicated): N/A DVT prophylaxis: PO Xarelto  GI prophylaxis: PPI Glucose control: SSI Mobility: PT / OT Code Status: FULL Family Communication: Spouse present at bedside 11/12 and updated regarding plan  Disposition: Progressive care    Montey Hora, [facility] 06/12/2019, 9:57 AM

## 2019-06-13 LAB — RENAL FUNCTION PANEL
Albumin: 2 g/dL — ABNORMAL LOW (ref 3.5–5.0)
Anion gap: 16 — ABNORMAL HIGH (ref 5–15)
BUN: 82 mg/dL — ABNORMAL HIGH (ref 8–23)
CO2: 31 mmol/L (ref 22–32)
Calcium: 7.1 mg/dL — ABNORMAL LOW (ref 8.9–10.3)
Chloride: 88 mmol/L — ABNORMAL LOW (ref 98–111)
Creatinine, Ser: 3.09 mg/dL — ABNORMAL HIGH (ref 0.61–1.24)
GFR calc Af Amer: 23 mL/min — ABNORMAL LOW (ref >60)
GFR calc non Af Amer: 20 mL/min — ABNORMAL LOW (ref >60)
Glucose, Bld: 158 mg/dL — ABNORMAL HIGH (ref 70–99)
Phosphorus: 2.2 mg/dL — ABNORMAL LOW (ref 2.5–4.6)
Potassium: 4.2 mmol/L (ref 3.5–5.1)
Sodium: 135 mmol/L (ref 135–145)

## 2019-06-13 LAB — GLUCOSE, CAPILLARY
Glucose-Capillary: 138 mg/dL — ABNORMAL HIGH (ref 70–99)
Glucose-Capillary: 353 mg/dL — ABNORMAL HIGH (ref 70–99)
Glucose-Capillary: 358 mg/dL — ABNORMAL HIGH (ref 70–99)
Glucose-Capillary: 281 mg/dL — ABNORMAL HIGH (ref 70–99)

## 2019-06-13 LAB — CBC
HCT: 32.5 % — ABNORMAL LOW (ref 39.0–52.0)
Hemoglobin: 10.3 g/dL — ABNORMAL LOW (ref 13.0–17.0)
MCH: 30.5 pg (ref 26.0–34.0)
MCHC: 31.7 g/dL (ref 30.0–36.0)
MCV: 96.2 fL (ref 80.0–100.0)
Platelets: 90 10*3/uL — ABNORMAL LOW (ref 150–400)
RBC: 3.38 MIL/uL — ABNORMAL LOW (ref 4.22–5.81)
RDW: 18.2 % — ABNORMAL HIGH (ref 11.5–15.5)
WBC: 8.9 10*3/uL (ref 4.0–10.5)
nRBC: 0 % (ref 0.0–0.2)

## 2019-06-13 LAB — MAGNESIUM: Magnesium: 2.1 mg/dL (ref 1.7–2.4)

## 2019-06-13 MED ORDER — FUROSEMIDE 10 MG/ML IJ SOLN
80.0000 mg | Freq: Two times a day (BID) | INTRAMUSCULAR | Status: DC
  Administered 2019-06-13 – 2019-06-14 (×2): 80 mg via INTRAVENOUS
  Filled 2019-06-13 (×2): qty 8

## 2019-06-13 MED ORDER — SALINE SPRAY 0.65 % NA SOLN
2.0000 | NASAL | Status: DC | PRN
  Filled 2019-06-13: qty 44

## 2019-06-13 NOTE — Progress Notes (Signed)
NAME:  Dylan Goodman, MRN:  [id number], DOB:  03/01/1951, LOS: 7 ADMISSION DATE:  05/31/2019, CONSULTATION DATE:  10/7 REFERRING MD:  Dr Jimmye Norman [facility] EDP, CHIEF COMPLAINT:  Cardiac arrest   Brief History   68 year old male admitted with recent COVID pneumonia who was transferred from inpatient rehab due to acute respiratory distress. Originally thought to respiratory distress was secondary to CHF exacerbation but CT chest concerning for possible multifocal pneumonia therefore antibiotics were resumed.   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 Patient has had extensive hospitalizations over the last several months at several [place] facilities. Time line as follows;  -9/14-9/19 admitted to [facility] for COVID-19 pneumonia  -10/5 he presented to [facility] with complaints of chest pain. Cardiac workup was negative and he was felt to be dehydrated. He was given IVF and discharged home. -10/6 he called EMS due to dizziness, and upon their arrival he had a witnessed 2-minute tonic/clonic type seizure abated with versed 2mg . Upon arrival to ED he was awake, but confused. COVID Test again positive. -10/7 abruptly developed respiratory distress and tachycardia with the appearance of VT vs AF RVR. He lost pulses. He was defibrillated and CPR was started. Total downtime approximated at 11 mins. -10/19  CT chest done 10/19 revealing extensive patchy consolidation with ground glass opacity prominent in BUL suggestive of PNA, atelectasis or possible early post-inflammatory fibrosis. -10/21 due to lack of improvement in symptoms, decision was made to treat him again with Remdesivir and antibiotics.  He developed progressive confusion with agitation and was intubated -10/25-10/29 CRRT utilized -11/6 - 11/7 Admitted to [place]  program for rehab but developed respiratory distress with CXR showing increased pleural effusion on left, BIPAP required due to increased WOB. Transferred back to acute care.   During this admission he has received treatment for acute on chronic respiratory failure felt secondary to multifocal pneumonia and pulmonary edema with IV antibiotics, diuretics,  and US guided thoracentesis (780ml fluid removed).   PCCM consulted 11/11 due to continued high oxygen demand.   Past Medical History  COVID pneumonia  VRE and ESBL  Seizures Persistent A-fib PAD MI Hx of CABG GERD Type 2 diabetes  Hypertension CKD stage IV Chronic systolic CHF Cardiac arrest Adrenal insufficiency   Significant Hospital Events   11/7 Admitted   Consults:  PCCM  Procedures:  Right Thoracentesis with IR  11/8 >> 739ml removed   Significant Diagnostic Tests:  CT Chest 11/8 >> mixed interstitial opacities bilaterally, small to mod effusions bilaterally. CXR 11/8 >> stable, possible small right apical PTX CXR 11/12 > stable.  L > R opacities Echo 11/7 > EF 20-25%  Micro Data:  Blood cultures 11/8 >>neg  Pleural fluid 11/8 >>neg Urine Cx: yeast  Antimicrobials:  Vancomycin 11/8 >> Cefepime 11/8 >>  Interim history/subjective:  Sleepy this AM after getting a bath.  Remains on 15L HFNC. -10.L net  Objective   Blood pressure 115/71, pulse 85, temperature (!) 97.5 F (36.4 C), temperature source Oral, resp. rate (!) 21, height 5\' 11"  (1.803 m), weight 76.2 kg, SpO2 91 %.    FiO2 (%):  [80 %] 80 %   Intake/Output Summary (Last 24 hours) at 06/13/2019 1128 Last data filed at 06/13/2019 0950 Gross per 24 hour  Intake 353 ml  Output 2875  ml  Net -2522 ml   Filed Weights   06/11/19 0353 06/12/19 1194 06/13/19 0541  Weight: 81.2 kg 77.1 kg 76.2 kg    Examination: GEN: chronically ill man laying in bed HEENT: MMM, trachea midline, temporal muscle wasting CV: RRR, ext warm PULM: Extremely poor  air movement, occasional accessory muscle use GI: Soft, +BS EXT: trivial edema, + muscle wasting NEURO: moves all 4 ext to command PSYCH: AOx3, good insight SKIN: scattered bruising  BMP reviewed, looks okay CBC showing drop in platelets, will have to keep an eye on No new chest imaging  Assessment & Plan:   # Acute on chronic hypoxemic respiratory failure due to volume overloaded state of heart and 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  Not really improving but diuresing well. # Not really seeing any signs of infection but Pct up # Pleural effusions related to volume overload and protein calorie malnutrition # Various electrolyte disturbances improved # Advanced CKD # Afib on amio  P:  -Continue to push diuresis, trend BMET and electrolytes -Continue steroids -Encourage staying awake during day -Cefepime x 7 days fine -Up to chair -Continue low dose amiodarone for now -Would do PAP at night and PRN -Check AM CXR, may consider thoracentesis tomorrow if there's fluid -Really having lack of progress, may need to consider palliative care consult, patient was not happy when this was brought up  Rest per primary team   Erskine Emery MD PCCM

## 2019-06-13 NOTE — Plan of Care (Signed)

## 2019-06-13 NOTE — Progress Notes (Signed)
[facility] NEPHROLOGY PROGRESS NOTE  Assessment/ Plan: Pt is a 68 y.o. yo male male with CHF, A. fib, DM, CKD with baseline creatinine around 2 admitted to [facility] from 9/14-9/19, readmitted on 10/6 status post cardiac arrest and suffered AKI on CKD required CRRT till 10/29.  #AKI on CKD, baseline creatinine around 2.  AKI likely ATN after cardiac arrest and concomitant with COVID-19 infection.  Initially treated with Lasix and then started CRRT from 10/24-10/29. -diuresing, SCr up today, reduce lasix to BID might be nearing euvolemia - likley will have new basleine GFR, near where we are  #HypoMg and HypoK: Severe on 11/12, aggressively repleted, stable today, CTM  #Metabolic acidosis: Serum bicarbonate 32, discontinue sodium bicarbonate.  CTM   #Acute hypoxic respiratory failure/pneumonia due to COVID-19 virus: Completed course of remdesivir and steroid.  Initially clinically improved.  PCCM following.   #Acute on chronic systolic CHF exacerbation: Continue to diurese.  LVEF 20 to 25%.  #Hypotension/possible adrenal insufficiency: Midodrine.  Blood pressure is improved.  #ESBL UTI possible acute urinary retention: Failed trial of voiding, continue Foley until follow-up with urology.  Subjective:   Pulm status unchanged  15L HFNC  2L net negative, 13kg down by weights  SCr up to 3.1, K and HCO3 stable  Remains on Vanc/Cefepime/steroids  LEE improved  K and Mg ok  Objective Vital signs in last 24 hours: Vitals:   06/13/19 0541 06/13/19 0715 06/13/19 0821 06/13/19 0910  BP:    115/71  Pulse:  75  74  Resp:  14  (!) 21  Temp:    (!) 97.5 F (36.4 C)  TempSrc:    Oral  SpO2:  93% 91% 91%  Weight: 76.2 kg     Height:       Weight change: -0.907 kg  Intake/Output Summary (Last 24 hours) at 06/13/2019 0928 Last data filed at 06/13/2019 0910 Gross per 24 hour  Intake 540 ml  Output 2875 ml  Net -2335 ml       Labs: Basic  Metabolic Panel: Recent Labs  Lab 06/11/19 1500 06/12/19 0911 06/13/19 0554  NA 137 137 135  K 3.1* 5.3* 4.2  CL 89* 91* 88*  CO2 30 32 31  GLUCOSE 290* 256* 158*  BUN 67* 74* 82*  CREATININE 2.77* 2.91* 3.09*  CALCIUM 6.0* 6.9* 7.1*  PHOS 2.4* 2.2* 2.2*   Liver Function Tests: Recent Labs  Lab 06/11/19 1500 06/12/19 0911 06/13/19 0554  ALBUMIN 2.0* 2.1* 2.0*   No results for input(s): LIPASE, AMYLASE in the last 168 hours. No results for input(s): AMMONIA in the last 168 hours. CBC: Recent Labs  Lab 06/07/19 0253 06/10/19 0349 06/13/19 0554  WBC 9.3 8.0 8.9  NEUTROABS 7.5 6.1  --   HGB 11.4* 11.4* 10.3*  HCT 37.2* 35.2* 32.5*  MCV 103.6* 96.2 96.2  PLT 157 147* 90*   Cardiac Enzymes: No results for input(s): CKTOTAL, CKMB, CKMBINDEX, TROPONINI in the last 168 hours. CBG: Recent Labs  Lab 06/12/19 0621 06/12/19 1143 06/12/19 1619 06/12/19 2219 06/13/19 0634  GLUCAP 267* 289* 310* 282* 138*    Iron Studies: No results for input(s): IRON, TIBC, TRANSFERRIN, FERRITIN in the last 72 hours. Studies/Results: No results found.  Medications: Infusions: . sodium chloride 10 mL/hr at 06/10/19 1600  . ceFEPime (MAXIPIME) IV 200 mL/hr at 06/13/19 0700    Scheduled Medications: . calcitRIOL  0.25 mcg Oral Daily  . Chlorhexidine Gluconate Cloth  6 each Topical Daily  .  furosemide  80 mg Intravenous BID  . Gerhardt's butt cream   Topical QID  . guaiFENesin  600 mg Oral BID  . insulin aspart  0-5 Units Subcutaneous QHS  . insulin aspart  0-9 Units Subcutaneous TID WC  . insulin glargine  22 Units Subcutaneous QHS  . ipratropium-albuterol  3 mL Inhalation BID  . lactobacillus acidophilus  2 tablet Oral TID  . levETIRAcetam  500 mg Oral BID  . Melatonin  6 mg Oral QHS  . methylPREDNISolone (SOLU-MEDROL) injection  40 mg Intravenous Q12H  . metoprolol succinate  200 mg Oral Daily  . midodrine  5 mg Oral TID WC  . mometasone-formoterol  2 puff Inhalation BID   . pantoprazole  40 mg Oral Daily  . polyethylene glycol  17 g Oral Daily  . rivaroxaban  15 mg Oral Q supper  . sodium chloride flush  10-40 mL Intracatheter Q12H  . sodium chloride flush  3 mL Intravenous Q12H  . tamsulosin  0.4 mg Oral Daily    have reviewed scheduled and prn medications.  Physical Exam: General: On nasal cannula, not in distress Heart:RRR, s1s2 nl, no rub Lungs: Bibasal crackles, no wheezing Abdomen:soft, Non-tender, non-distended Extremities: Trace lower extremity edema Dialysis Access: No HD catheter present GU: Foley  Marisa Hage B Carley Strickling 06/13/2019,9:28 AM  LOS: 7 days

## 2019-06-13 NOTE — Progress Notes (Signed)
PROGRESS NOTE    Dylan Goodman  XIP:[id number] DOB: 05/21/1951 DOA: 06/21/2019 PCP: Steele Sizer, MD   Brief Narrative:  Patient is a 68 year old male with history of  atrial fibrillation, GI bleed, coronary artery disease status post CABG, nasopharyngeal cancer, cardiomyopathy, chronic depression, CKD stage 4, diabetes mellitus, GI bleed , peripheral vascular disease, previous smoker, recent Covid-19 infection, status post recent intubation/extubation in the setting of seizures who was sent from inpatient rehab for the evaluation of acute respiratory distress.  He was just discharged from here to inpatient rehab on 06/05/19.  He developed respiratory distress overnight and had to be kept on BiPAP.  He was admitted for the management of acute on chronic CHF exacerbation.he was also noted to have peripheral edema, diffuse Rales.  Chest x-ray concerning for pulmonary edema.  CT chest without contrast  showed possible multifocal pneumonia so he has been started on antibiotics.  He is also being followed by cardiology for new finding of worsening systolic heart failure and A. fib with RVR.Plan for cardioversion when respiratory status is more stable. He is currently on high flow oxygen.  Hospital course remarkable for very slow improvement in the respiratory status.  Nephrology following and he is on IV diuresis.I have also requested PCCM consult    Assessment & Plan:   Principal Problem:   Respiratory failure, acute ([place]) Active Problems:   CKD (chronic kidney disease), stage IV (HCC)   Coronary artery disease involving coronary bypass graft of native heart   IDA (iron deficiency anemia)   Type 2 diabetes mellitus with stage 4 chronic kidney disease, with long-term current use of insulin (HCC)   Metabolic acidosis   Atrial fibrillation with RVR (HCC)   Seizure (HCC)   Acute on chronic systolic (congestive) heart failure (HCC)   A/p 1)Acute on chronic respiratory failure: Secondary  to multifocal pneumonia and pulmonary edema -continue BiPAP nightly -requiring oxygen at 15 L/min continuously -c/n  Solu-Medrol to 40 mg every 12 hours -Despite adequate diuresis, steroids and antibiotics, respiratory status remains tenuous, oxygen requirement remains very high  2)MultiFocal PNA-respiratory symptoms and hypoxia persist,  -Continue cefepime, bronchodilators mucolytics and supplemental oxygen please see #1 above -Discussed with Dr. Ina Homes from pulmonology service  3)HFrEF--acute on chronic diastolic CHF exacerbation, echo this admission shows EF down to 20 to 25% with diffuse hypokinesis previous echo from 05/08/2019 showed EF of 40 to 45% -US guided thoracentesis of the right sided moderate pleural effusion with removal of 700 ml of clear fluid on 06/07/2019 -Continue IV Lasix 80 mg bid -Despite aggressive diuresis hypoxia and significant respiratory distress persist -Plan for repeat chest x-ray on 06/14/2019, may need repeat thoracentesis - 4)A. fib with RVR: Hospital course remarkable for Afib with RVR  .  Cardiology input appreciated On Eliquis at home for anticoagulation .  Anticoagulation changed to Xarelto. Cardiology planning for DCCV when his respiratory status is more stable.   -Continue metoprolol and amiodarone for rate control  5)AKI on CKD stage 4:  -AKI likely ATN after cardiac arrest and concomitant with COVID-19 infection.  - Initially treated with Lasix and then started CRRT from 10/24-10/29. --Baseline creatinine around 2.5-3.   - Continue diuresis -Creatinine currently around 3.0 -Discussed with nephrologist Dr. Joelyn Oms -Continue bicarb  6)Electrolyte abnormalities /Hypocalcemia / hypokalemia / Hypomagnesiemia----  --electrolyte derangement improving,  replace and recheck  7)Seizure:  Stable, continue Keppra.  Follow-up with neurology as an outpatient.  8)BPH with LUTS with urinary retention --- patient failed voiding trial,  continue Foley in  situ, continue Flomax  9)Insulin-dependent diabetic mellitus: Hemoglobin A1c of 6.7 in March 10, 2019.  On insulin at home.  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.-Continue midodrine  11)Debility/Deconditioning: very weak, too hypoxic to allow for significant PT evaluation  12) social/ethics/ palliative care--- patient with multiple comorbid conditions, prognosis overall is poor- Advanced directive discussed with patient and wife at bedside at this time patient remains a full code, -Requested palliative care consult to help delineate goals of care and further conversations about advanced directives  DVT prophylaxis: Xarelto Code Status: Full Family Communication: Discussed with wife  Disposition Plan:     Back to back to CIR when medically stable  Consultants: Cardiology, nephrology/pulmonology  Procedures: Thoracentesis Antimicrobials:  Anti-infectives (From admission, onward)   Start     Dose/Rate Route Frequency Ordered Stop   06/08/19 2156  ceFEPIme (MAXIPIME) 2 g in sodium chloride 0.9 % 100 mL IVPB     2 g 200 mL/hr over 30 Minutes Intravenous Every 24 hours 06/08/19 0836 06/14/19 2159   06/08/19 1000  vancomycin (VANCOCIN) IVPB 1000 mg/200 mL premix  Status:  Discontinued     1,000 mg 200 mL/hr over 60 Minutes Intravenous Every 24 hours 06/07/19 1109 06/12/19 1042   06/07/19 1115  ceFEPIme (MAXIPIME) 2 g in sodium chloride 0.9 % 100 mL IVPB  Status:  Discontinued     2 g 200 mL/hr over 30 Minutes Intravenous Every 12 hours 06/07/19 1109 06/08/19 0836   06/07/19 1115  vancomycin (VANCOCIN) 1,500 mg in sodium chloride 0.9 % 500 mL IVPB     1,500 mg 250 mL/hr over 120 Minutes Intravenous  Once 06/07/19 1109 06/07/19 1412      Subjective: -Wife at bedside, -Remains very short of breath, continues to require high flow oxygen  -Good urine output   Objective: Vitals:   06/13/19 0910 06/13/19 0948 06/13/19 1144 06/13/19 1650  BP: 115/71  125/77 136/75  Pulse: 74 85 87 85  Resp: (!) 21  15 (!) 21  Temp: (!) 97.5 F (36.4 C)  97.8 F (36.6 C) 97.8 F (36.6 C)  TempSrc: Oral  Oral Oral  SpO2: 91%  90% 94%  Weight:      Height:        Intake/Output Summary (Last 24 hours) at 06/13/2019 1930 Last data filed at 06/13/2019 1841 Gross per 24 hour  Intake 753 ml  Output 2825 ml  Net -2072 ml   Filed Weights   06/11/19 0353 06/12/19 0632 06/13/19 0541  Weight: 81.2 kg 77.1 kg 76.2 kg    Examination:  General exam: Chronically ill-appearing HEENT:PERRL,Oral mucosa moist,  Nose- [place] oxygen 15L  Respiratory system: Bilateral decreased air entry, very faint crackles on the bases Cardiovascular system: Irregularly irregular, CABG scar gastrointestinal system: Abdomen is nondistended, soft and nontender.  Normal bowel sounds heard. -Prior laparotomy scars Central nervous system: Alert and oriented.  Generalized weakness, no New focal neurological deficits. Extremities: Trace edema,distal peripheral pulses palpable. Skin: No rashes, lesions or ulcers,no icterus ,no pallor GU-Foley catheter in situ   Data Reviewed:   CBC: Recent Labs  Lab 06/07/19 0253 06/10/19 0349 06/13/19 0554  WBC 9.3 8.0 8.9  NEUTROABS 7.5 6.1  --   HGB 11.4* 11.4* 10.3*  HCT 37.2* 35.2* 32.5*  MCV 103.6* 96.2 96.2  PLT 157 147* 90*   Basic Metabolic Panel:  Recent Labs  Lab 06/10/19 0349 06/11/19 0411 06/11/19 0520 06/11/19 1500 06/12/19 0911 06/13/19 0554  NA 136 141  --  137 137 135  K 5.3* 2.8*  --  3.1* 5.3* 4.2  CL 94* 95*  --  89* 91* 88*  CO2 28 31  --  30 32 31  GLUCOSE 130* 83  --  290* 256* 158*  BUN 67* 69*  --  67* 74* 82*  CREATININE 2.78* 2.76*  --  2.77* 2.91* 3.09*  CALCIUM 5.6* 5.7*  --  6.0* 6.9* 7.1*  MG  --  0.3* 0.2* 1.0* 1.8 2.1  PHOS  --   --   --  2.4* 2.2* 2.2*   GFR: Estimated  Creatinine Clearance: 25.4 mL/min (A) (by C-G formula based on SCr of 3.09 mg/dL (H)). Liver Function Tests: Recent Labs  Lab 06/11/19 1500 06/12/19 0911 06/13/19 0554  ALBUMIN 2.0* 2.1* 2.0*   No results for input(s): LIPASE, AMYLASE in the last 168 hours. No results for input(s): AMMONIA in the last 168 hours. Coagulation Profile: No results for input(s): INR, PROTIME in the last 168 hours. Cardiac Enzymes: No results for input(s): CKTOTAL, CKMB, CKMBINDEX, TROPONINI in the last 168 hours. BNP (last 3 results) No results for input(s): PROBNP in the last 8760 hours. HbA1C: No results for input(s): HGBA1C in the last 72 hours. CBG: Recent Labs  Lab 06/12/19 1619 06/12/19 2219 06/13/19 0634 06/13/19 1143 06/13/19 1651  GLUCAP 310* 282* 138* 353* 358*   Lipid Profile: No results for input(s): CHOL, HDL, LDLCALC, TRIG, CHOLHDL, LDLDIRECT in the last 72 hours. Thyroid Function Tests: No results for input(s): TSH, T4TOTAL, FREET4, T3FREE, THYROIDAB in the last 72 hours. Anemia Panel: No results for input(s): VITAMINB12, FOLATE, FERRITIN, TIBC, IRON, RETICCTPCT in the last 72 hours. Sepsis Labs: Recent Labs  Lab 06/07/19 0253  PROCALCITON 2.10    Recent Results (from the past 240 hour(s))  Urine culture     Status: Abnormal   Collection Time: 06/05/19  5:45 PM   Specimen: Urine, Catheterized  Result Value Ref Range Status   Specimen Description URINE, CATHETERIZED  Final   Special Requests   Final    PATIENT ON FOLLOWING ZYVOX Performed at [facility], [place] [address], [place], [place] [address]    Culture 80,000 COLONIES/mL YEAST (A)  Final   Report Status 06/18/2019 FINAL  Final  Gram stain     Status: None   Collection Time: 06/07/19 11:22 AM   Specimen: Lung, Right; Pleural Fluid  Result Value Ref Range Status   Specimen Description PLEURAL RIGHT  Final   Special Requests NONE  Final   Gram Stain   Final    FEW WBC PRESENT,BOTH PMN AND MONONUCLEAR NO  ORGANISMS SEEN Performed at [facility], [place] [address], [place], [place] [address]    Report Status 06/07/2019 FINAL  Final  Culture, body fluid-bottle     Status: None   Collection Time: 06/07/19 11:23 AM   Specimen: Pleura  Result Value Ref Range Status   Specimen Description PLEURAL RIGHT  Final   Special Requests NONE  Final   Culture   Final    NO GROWTH 5 DAYS Performed at [facility] [address] [place], [place] [address]    Report Status 06/12/2019 FINAL  Final    Radiology Studies: No results found. Scheduled Meds: . calcitRIOL  0.25 mcg Oral Daily  . Chlorhexidine Gluconate Cloth  6 each Topical  Daily  . furosemide  80 mg Intravenous BID  . Gerhardt's butt cream   Topical QID  . guaiFENesin  600 mg Oral BID  . insulin aspart  0-5 Units Subcutaneous QHS  . insulin aspart  0-9 Units Subcutaneous TID WC  . insulin glargine  22 Units Subcutaneous QHS  . ipratropium-albuterol  3 mL Inhalation BID  . lactobacillus acidophilus  2 tablet Oral TID  . levETIRAcetam  500 mg Oral BID  . Melatonin  6 mg Oral QHS  . methylPREDNISolone (SOLU-MEDROL) injection  40 mg Intravenous Q12H  . metoprolol succinate  200 mg Oral Daily  . midodrine  5 mg Oral TID WC  . mometasone-formoterol  2 puff Inhalation BID  . pantoprazole  40 mg Oral Daily  . polyethylene glycol  17 g Oral Daily  . rivaroxaban  15 mg Oral Q supper  . sodium chloride flush  10-40 mL Intracatheter Q12H  . sodium chloride flush  3 mL Intravenous Q12H  . tamsulosin  0.4 mg Oral Daily   Continuous Infusions: . sodium chloride 10 mL/hr at 06/10/19 1600  . ceFEPime (MAXIPIME) IV 200 mL/hr at 06/13/19 1501    LOS: 7 days   Roxan Hockey, MD [facility] If 7PM-7AM, please contact night-coverage [email or web address] Password [email or web address] 06/13/2019, 7:30 PM

## 2019-06-14 ENCOUNTER — Inpatient Hospital Stay (HOSPITAL_COMMUNITY): Payer: Medicare Other

## 2019-06-14 LAB — GLUCOSE, CAPILLARY
Glucose-Capillary: 285 mg/dL — ABNORMAL HIGH (ref 70–99)
Glucose-Capillary: 360 mg/dL — ABNORMAL HIGH (ref 70–99)
Glucose-Capillary: 257 mg/dL — ABNORMAL HIGH (ref 70–99)

## 2019-06-14 LAB — CBC
HCT: 33.7 % — ABNORMAL LOW (ref 39.0–52.0)
Hemoglobin: 10.8 g/dL — ABNORMAL LOW (ref 13.0–17.0)
MCH: 31.2 pg (ref 26.0–34.0)
MCHC: 32 g/dL (ref 30.0–36.0)
MCV: 97.4 fL (ref 80.0–100.0)
Platelets: 106 10*3/uL — ABNORMAL LOW (ref 150–400)
RBC: 3.46 MIL/uL — ABNORMAL LOW (ref 4.22–5.81)
RDW: 18.1 % — ABNORMAL HIGH (ref 11.5–15.5)
WBC: 9.4 10*3/uL (ref 4.0–10.5)
nRBC: 0 % (ref 0.0–0.2)

## 2019-06-14 LAB — BASIC METABOLIC PANEL
Anion gap: 15 (ref 5–15)
BUN: 87 mg/dL — ABNORMAL HIGH (ref 8–23)
CO2: 32 mmol/L (ref 22–32)
Calcium: 7.8 mg/dL — ABNORMAL LOW (ref 8.9–10.3)
Chloride: 89 mmol/L — ABNORMAL LOW (ref 98–111)
Creatinine, Ser: 3.37 mg/dL — ABNORMAL HIGH (ref 0.61–1.24)
GFR calc Af Amer: 21 mL/min — ABNORMAL LOW (ref >60)
GFR calc non Af Amer: 18 mL/min — ABNORMAL LOW (ref >60)
Glucose, Bld: 240 mg/dL — ABNORMAL HIGH (ref 70–99)
Potassium: 4 mmol/L (ref 3.5–5.1)
Sodium: 136 mmol/L (ref 135–145)

## 2019-06-14 LAB — MAGNESIUM: Magnesium: 1.7 mg/dL (ref 1.7–2.4)

## 2019-06-14 IMAGING — DX DG CHEST 1V PORT
1 series · 1 of 1 positions shown · non-contrast
Comparison: [DATE]

CLINICAL DATA: ARDS.

EXAM:
PORTABLE CHEST 1 VIEW

[chest ap]
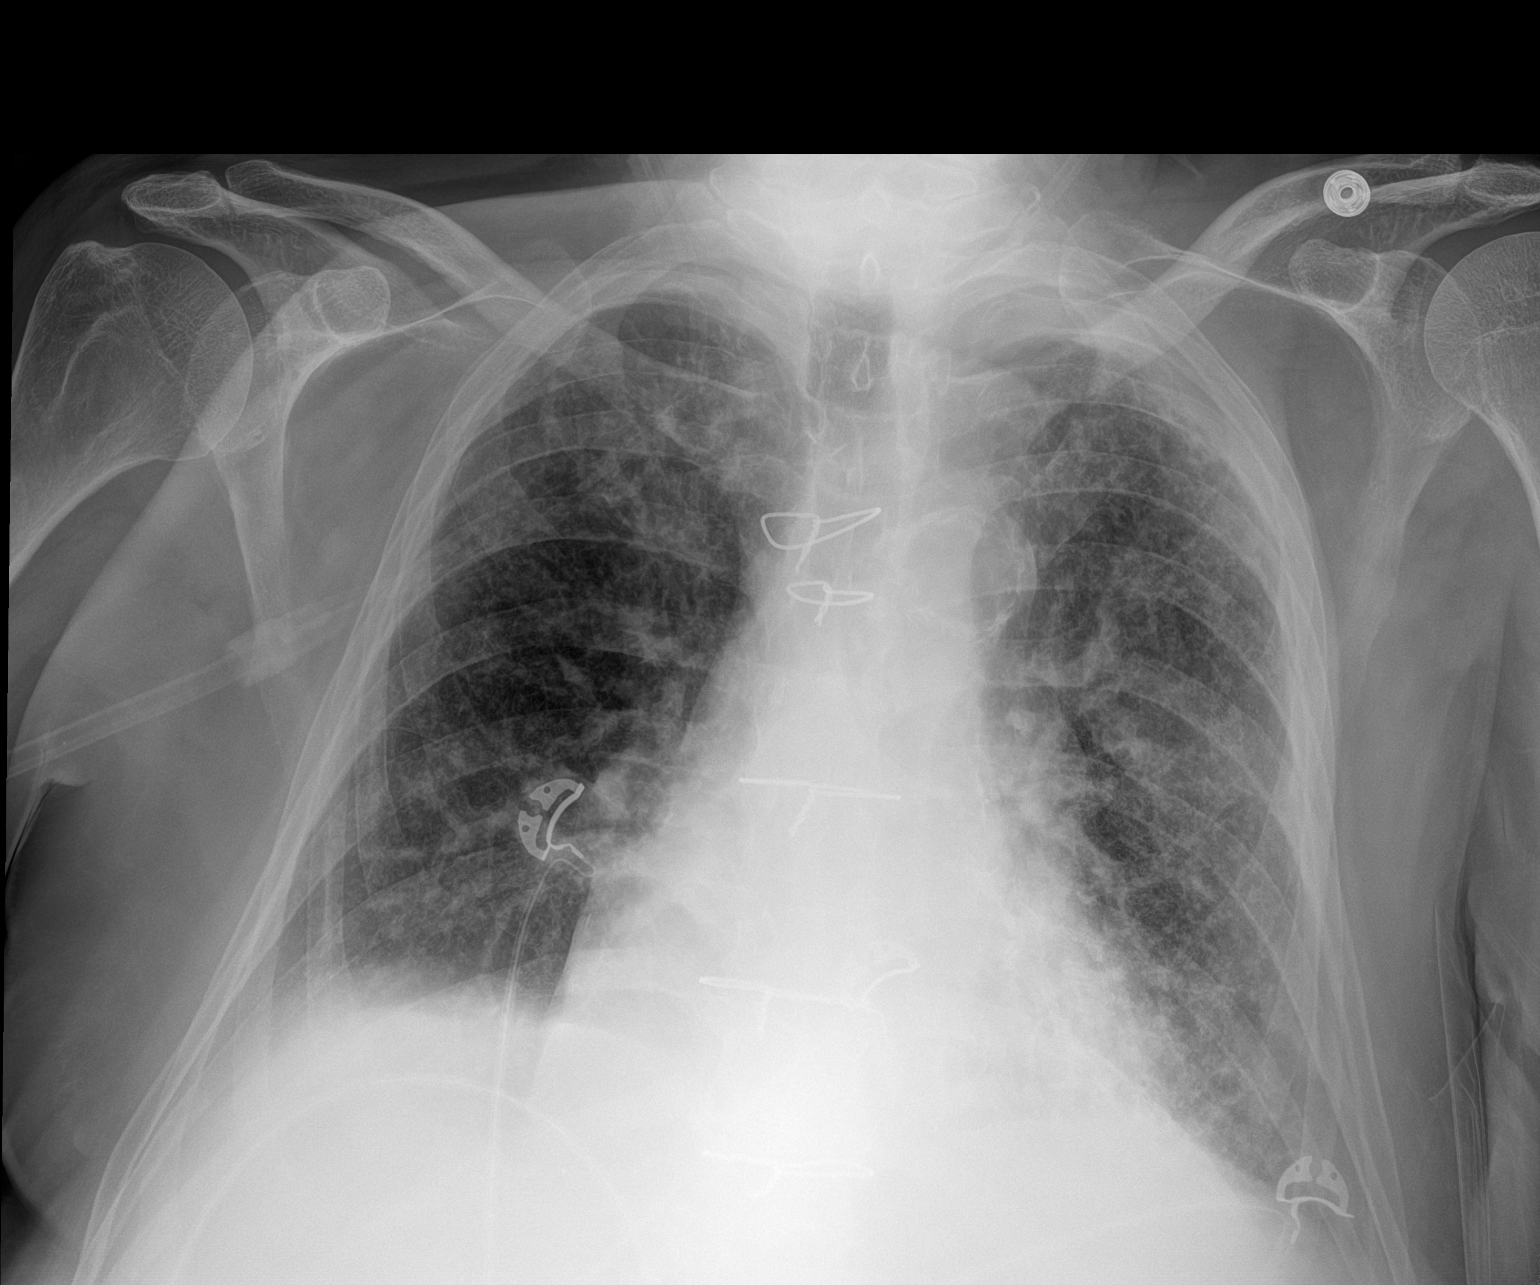

[1 of 1 positions shown; findings below may reference images not displayed]

FINDINGS: Previous median sternotomy and CABG procedure. Mild cardiac
enlargement. Aortic atherosclerosis. Persistent bilateral pleural
effusions. Unchanged bilateral interstitial and airspace opacities.
IMPRESSION: 1. No change in aeration to the lungs compared with prior exam.
2. Persistent bilateral pleural effusions.

## 2019-06-14 MED ORDER — PREDNISONE 20 MG PO TABS
40.0000 mg | ORAL_TABLET | Freq: Every day | ORAL | Status: DC
  Administered 2019-06-15: 40 mg via ORAL
  Filled 2019-06-14: qty 2

## 2019-06-14 MED ORDER — INSULIN GLARGINE 100 UNIT/ML ~~LOC~~ SOLN
18.0000 [IU] | Freq: Two times a day (BID) | SUBCUTANEOUS | Status: DC
  Administered 2019-06-14 (×2): 18 [IU] via SUBCUTANEOUS
  Filled 2019-06-14 (×4): qty 0.18

## 2019-06-14 NOTE — Progress Notes (Signed)
PROGRESS NOTE    Dylan Goodman  OYD:[id number] DOB: 1950-11-25 DOA: 06/27/2019 PCP: Steele Sizer, MD   Brief Narrative:  Patient is a 68 year old male with history of  atrial fibrillation, GI bleed, coronary artery disease status post CABG, nasopharyngeal cancer, cardiomyopathy, chronic depression, CKD stage 4, diabetes mellitus, GI bleed , peripheral vascular disease, previous smoker, recent Covid-19 infection, status post recent intubation/extubation in the setting of seizures who was sent from inpatient rehab for the evaluation of acute respiratory distress.  He was just discharged from here to inpatient rehab on 06/05/19.  He developed respiratory distress overnight and had to be kept on BiPAP.  He was admitted for the management of acute on chronic CHF exacerbation.he was also noted to have peripheral edema, diffuse Rales.  Chest x-ray concerning for pulmonary edema.  CT chest without contrast  showed possible multifocal pneumonia so he has been started on antibiotics.  He is also being followed by cardiology for new finding of worsening systolic heart failure and A. fib with RVR.Plan for cardioversion when respiratory status is more stable. He is currently on high flow oxygen.  Hospital course remarkable for very slow improvement in the respiratory status.  Nephrology following and he is on IV diuresis.  PCCM consulted on 06/13/2019.  Subjective: Seen and examined.  He has no complaints.  Despite of being on 15 L of high flow, he denied any shortness of breath or any chest pain.  Assessment & Plan:   Principal Problem:   Respiratory failure, acute ([place]) Active Problems:   CKD (chronic kidney disease), stage IV (HCC)   Coronary artery disease involving coronary bypass graft of native heart   IDA (iron deficiency anemia)   Type 2 diabetes mellitus with stage 4 chronic kidney disease, with long-term current use of insulin (HCC)   Metabolic acidosis   Atrial fibrillation with RVR  (HCC)   Seizure (HCC)   Acute on chronic systolic (congestive) heart failure (HCC)   A/p 1)Acute on chronic respiratory failure: Secondary to multifocal pneumonia and pulmonary edema -continue BiPAP nightly -requiring oxygen at 15 L/min continuously -c/n  Solu-Medrol to 40 mg every 12 hours -Despite adequate diuresis, steroids and antibiotics, respiratory status remains tenuous, oxygen requirement remains very high.  PCCM was consulted.  Repeat chest x-ray done today which shows minimal bilateral pleural effusion.  Appreciate PCCM being on board and defer management to them.  2)MultiFocal PNA-respiratory symptoms and hypoxia persist,  -Continue cefepime, bronchodilators mucolytics and supplemental oxygen please see #1 above.  PCCM on board.  3)HFrEF--acute on chronic diastolic CHF exacerbation, echo this admission shows EF down to 20 to 25% with diffuse hypokinesis previous echo from 05/08/2019 showed EF of 40 to 45% -US guided thoracentesis of the right sided moderate pleural effusion with removal of 700 ml of clear fluid on 06/07/2019.  Repeat chest x-ray 06/14/2019 shows minimal pleural effusion bilaterally. -Continue IV Lasix 80 mg bid -Despite aggressive diuresis hypoxia and significant respiratory distress persist - 4)A. fib with RVR: Hospital course remarkable for Afib with RVR  .  Cardiology input appreciated On Eliquis at home for anticoagulation .  Anticoagulation changed to Xarelto. Cardiology planning for DCCV when his respiratory status is more stable.   -Continue metoprolol and amiodarone for rate control.  Rates are controlled now.  5)AKI on CKD stage 4:  -AKI likely ATN after cardiac arrest and concomitant with COVID-19 infection.  - Initially treated with Lasix and then started CRRT from 10/24-10/29. --Baseline creatinine around 2.5-3.   -  Continue diuresis -Slight jump in the creatinine today.  Nephrology on board.  Management per them.  6)Electrolyte abnormalities  /Hypocalcemia / hypokalemia / Hypomagnesiemia----  --electrolyte derangement improving,  replace and recheck  7)Seizure:  Stable, continue Keppra.  Follow-up with neurology as an outpatient.  8)BPH with LUTS with urinary retention --- patient failed voiding trial, continue Foley in situ, continue Flomax  9)Insulin-dependent diabetic mellitus: Hemoglobin A1c of 6.7 in March 10, 2019.  On insulin at home.  Blood sugar significantly elevated likely due to being on steroids.  Currently on 22 units of Lantus nightly.  Will change to 18 units twice daily and continue SSI.    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  Patient is alert and oriented.  Discussed plan of care with him in detail. Disposition Plan:     Back to back to CIR when medically stable  Consultants: Cardiology, nephrology/pulmonology  Procedures: Thoracentesis Antimicrobials:  Anti-infectives (From admission, onward)   Start     Dose/Rate Route Frequency Ordered Stop   06/08/19 2156  ceFEPIme (MAXIPIME) 2 g in sodium chloride 0.9 % 100 mL IVPB     2 g 200 mL/hr over 30 Minutes Intravenous Every 24 hours 06/08/19 0836 06/14/19 0749   06/08/19 1000  vancomycin (VANCOCIN) IVPB 1000 mg/200 mL premix  Status:  Discontinued     1,000 mg 200 mL/hr over 60 Minutes Intravenous Every 24 hours 06/07/19 1109 06/12/19 1042   06/07/19 1115  ceFEPIme (MAXIPIME) 2 g in sodium chloride 0.9 %  100 mL IVPB  Status:  Discontinued     2 g 200 mL/hr over 30 Minutes Intravenous Every 12 hours 06/07/19 1109 06/08/19 0836   06/07/19 1115  vancomycin (VANCOCIN) 1,500 mg in sodium chloride 0.9 % 500 mL IVPB     1,500 mg 250 mL/hr over 120 Minutes Intravenous  Once 06/07/19 1109 06/07/19 1412         Objective: Vitals:   06/14/19 0330 06/14/19 0754 06/14/19 0837 06/14/19 0847  BP: 114/76 131/78    Pulse: 90 90 85   Resp: 15 18    Temp:  (!) 97.3 F (36.3 C)    TempSrc:  Oral    SpO2: 98% 95%  96%  Weight:      Height:        Intake/Output Summary (Last 24 hours) at 06/14/2019 0938 Last data filed at 06/14/2019 0909 Gross per 24 hour  Intake 773 ml  Output 3800 ml  Net -3027 ml   Filed Weights   06/12/19 1572 06/13/19 0541 06/14/19 0145  Weight: 77.1 kg 76.2 kg 75.3 kg    Examination:  General exam: Appears calm and comfortable  Respiratory system: Coarse breath sounds with rhonchi bilaterally. Cardiovascular system: S1 & S2 heard, irregularly irregular rate and rhythm. No JVD, murmurs, rubs, gallops or clicks. No pedal edema. Gastrointestinal system: Abdomen is nondistended, soft and nontender. No organomegaly or masses felt. Normal bowel sounds heard. Central nervous system: Alert and oriented. No focal neurological deficits. Extremities: Symmetric 5 x 5 power. Skin: No rashes, lesions or ulcers.  Psychiatry: Judgement and insight appear normal. Mood & affect appropriate.     Data Reviewed:   CBC: Recent Labs  Lab 06/10/19 0349 06/13/19 0554 06/14/19 0258  WBC 8.0 8.9 9.4  NEUTROABS 6.1  --   --   HGB 11.4* 10.3* 10.8*  HCT 35.2* 32.5* 33.7*  MCV 96.2 96.2 97.4  PLT 147* 90* 742*   Basic Metabolic Panel: Recent Labs  Lab 06/11/19 0411 06/11/19 0520 06/11/19 1500 06/12/19 0911 06/13/19 0554 06/14/19 0258  NA 141  --  137 137 135 136  K 2.8*  --  3.1* 5.3* 4.2 4.0  CL 95*  --  89* 91* 88* 89*  CO2 31  --  30 32 31 32  GLUCOSE 83  --  290*  256* 158* 240*  BUN 69*  --  67* 74* 82* 87*  CREATININE 2.76*  --  2.77* 2.91* 3.09* 3.37*  CALCIUM 5.7*  --  6.0* 6.9* 7.1* 7.8*  MG 0.3* 0.2* 1.0* 1.8 2.1 1.7  PHOS  --   --  2.4* 2.2* 2.2*  --    GFR: Estimated Creatinine Clearance: 23.3 mL/min (A) (by C-G formula based on SCr of 3.37 mg/dL (H)). Liver Function Tests: Recent Labs  Lab 06/11/19 1500 06/12/19 0911 06/13/19 0554  ALBUMIN 2.0* 2.1* 2.0*   No results for input(s): LIPASE, AMYLASE in the last 168 hours. No results for input(s): AMMONIA in the last 168 hours. Coagulation Profile: No results for input(s): INR, PROTIME in the last 168 hours. Cardiac Enzymes: No results for input(s): CKTOTAL, CKMB, CKMBINDEX, TROPONINI in the last 168 hours. BNP (last 3 results) No results for input(s): PROBNP in the last 8760 hours. HbA1C: No results for input(s): HGBA1C in the last 72 hours. CBG: Recent Labs  Lab 06/13/19 0634 06/13/19 1143 06/13/19 1651 06/13/19 2050 06/14/19 0625  GLUCAP 138* 353* 358* 281* 285*   Lipid Profile: No results for input(s): CHOL, HDL, LDLCALC, TRIG, CHOLHDL, LDLDIRECT in the last 72 hours. Thyroid Function Tests: No results for input(s): TSH, T4TOTAL, FREET4, T3FREE, THYROIDAB in the last 72 hours. Anemia Panel: No results for input(s): VITAMINB12, FOLATE, FERRITIN, TIBC, IRON, RETICCTPCT in the last 72 hours. Sepsis Labs: No results for input(s): PROCALCITON, LATICACIDVEN in the last 168 hours.  Recent Results (from the past 240 hour(s))  Urine culture     Status: Abnormal   Collection Time: 06/05/19  5:45 PM   Specimen: Urine, Catheterized  Result Value Ref Range Status   Specimen Description URINE, CATHETERIZED  Final   Special Requests   Final    PATIENT ON FOLLOWING ZYVOX Performed at [facility], [address] [place], [place] [address]    Culture 80,000 COLONIES/mL YEAST (A)  Final   Report Status 06/17/2019 FINAL  Final  Gram stain     Status: None   Collection  Time: 06/07/19 11:22 AM   Specimen: Lung, Right; Pleural Fluid  Result Value Ref Range Status   Specimen Description PLEURAL RIGHT  Final   Special Requests NONE  Final   Gram Stain   Final    FEW WBC PRESENT,BOTH PMN AND MONONUCLEAR NO ORGANISMS SEEN Performed at [facility], [place] [address] [place], [place] [address]    Report Status 06/07/2019 FINAL  Final  Culture, body fluid-bottle     Status: None   Collection Time: 06/07/19 11:23 AM   Specimen: Pleura  Result Value Ref Range Status   Specimen Description PLEURAL RIGHT  Final  Special Requests NONE  Final   Culture   Final    NO GROWTH 5 DAYS Performed at [facility], [place] [address], [place], [place] [address]    Report Status 06/12/2019 FINAL  Final    Radiology Studies: Dg Chest Port 1 View  Result Date: 06/14/2019 CLINICAL DATA:  ARDS. EXAM: PORTABLE CHEST 1 VIEW COMPARISON:  06/11/2019 FINDINGS: Previous median sternotomy and CABG procedure. Mild cardiac enlargement. Aortic atherosclerosis. Persistent bilateral pleural effusions. Unchanged bilateral interstitial and airspace opacities. IMPRESSION: 1. No change in aeration to the lungs compared with prior exam. 2. Persistent bilateral pleural effusions. Electronically Signed   By: Kerby Moors M.D.   On: 06/14/2019 06:13   Scheduled Meds: . calcitRIOL  0.25 mcg Oral Daily  . Chlorhexidine Gluconate Cloth  6 each Topical Daily  . furosemide  80 mg Intravenous BID  . Gerhardt's butt cream   Topical QID  . guaiFENesin  600 mg Oral BID  . insulin aspart  0-5 Units Subcutaneous QHS  . insulin aspart  0-9 Units Subcutaneous TID WC  . insulin glargine  18 Units Subcutaneous BID  . ipratropium-albuterol  3 mL Inhalation BID  . lactobacillus acidophilus  2 tablet Oral TID  . levETIRAcetam  500 mg Oral BID  . Melatonin  6 mg Oral QHS  . methylPREDNISolone (SOLU-MEDROL) injection  40 mg Intravenous Q12H  . metoprolol succinate  200 mg Oral Daily  . midodrine   5 mg Oral TID WC  . mometasone-formoterol  2 puff Inhalation BID  . pantoprazole  40 mg Oral Daily  . polyethylene glycol  17 g Oral Daily  . rivaroxaban  15 mg Oral Q supper  . sodium chloride flush  10-40 mL Intracatheter Q12H  . sodium chloride flush  3 mL Intravenous Q12H  . tamsulosin  0.4 mg Oral Daily   Continuous Infusions: . sodium chloride 10 mL/hr at 06/10/19 1600    LOS: 8 days   Time spent 31 minutes  Darliss Cheney, MD [facility] If 7PM-7AM, please contact night-coverage [email or web address] Password [facility] 06/14/2019, 9:38 AM

## 2019-06-14 NOTE — Progress Notes (Signed)
[place] for Rivaroxaban  Indication: atrial fibrillation   Patient Measurements: Height: 5\' 11"  (180.3 cm) Weight: 166 lb (75.3 kg) IBW/kg (Calculated) : 75.3 Heparin Dosing Weight: 86.7 kg  Vital Signs: Temp: 97.3 F (36.3 C) (11/15 0754) Temp Source: Oral (11/15 0754) BP: 131/78 (11/15 0754) Pulse Rate: 85 (11/15 0837)  Labs: Recent Labs    06/12/19 0911 06/13/19 0554 06/14/19 0258  HGB  --  10.3* 10.8*  HCT  --  32.5* 33.7*  PLT  --  90* 106*  CREATININE 2.91* 3.09* 3.37*   Assessment: 68 y.o. male with Afib RVR and low EF start amiodarone drip. Previously on apixaban 2.5mg  BID - dose too low for this patient - plan to transition to rivaroxaban 15mg  daily (lower dose with CrCl about 23 ml/min) with Cr 3.37 and wt 75 kg H/h stable   Goal of Therapy:   Monitor platelets by anticoagulation protocol: Yes  Plan:  Rivaroxaban 15mg  Daily If renal function continues to worsen, may need to consider alternative anticoagulation.  Marguerite Olea, [facility] Clinical Pharmacist Phone [phone]  06/14/2019 2:05 PM

## 2019-06-14 NOTE — Progress Notes (Signed)
Pt placed on bipap at 2200 by RN. Pt removed Bipap at [facility]

## 2019-06-14 NOTE — Procedures (Signed)
[place] Chest  Trivial left effusion Very small R effusion with no safe approach for thoracentesis

## 2019-06-14 NOTE — Progress Notes (Signed)
[facility] NEPHROLOGY PROGRESS NOTE  Assessment/ Plan: Pt is a 68 y.o. yo male male with CHF, A. fib, DM, CKD with baseline creatinine around 2 admitted to [facility] from 9/14-9/19, readmitted on 10/6 status post cardiac arrest and suffered AKI on CKD required CRRT till 10/29.  #AKI on CKD, baseline creatinine around 2.  AKI likely ATN after cardiac arrest and concomitant with COVID-19 infection.  Initially treated with Lasix and then started CRRT from 10/24-10/29. - Has had sig diuresis and I think now approaching euvolemia, SCr increasing.  Will hold further diuretics today, consider starting PO diuretics tomorrow  #HypoMg and HypoK: Severe on 11/12, aggressively repleted, stable today, CTM  #Metabolic acidosis: resolved, now off NaHCO3  CTM   #Acute hypoxic respiratory failure/pneumonia due to COVID-19 virus: Completed course of remdesivir and steroid.  Initially clinically improved.  PCCM following.   #Acute on chronic systolic CHF exacerbation: Fluid mgmt as above.  LVEF 20 to 25%.  #Hypotension/possible adrenal insufficiency: Midodrine.  BP stable.  Urinary retention: keep Foley in  Subjective:   Pulm status unchanged  15L HFNC  SCr cont to inc, 3.7L UOP yesterday on 80 IV BID Lasix  Down 32lb from peak weight  K and HCO3 ok  No sig LEE  Objective Vital signs in last 24 hours: Vitals:   06/14/19 0330 06/14/19 0754 06/14/19 0837 06/14/19 0847  BP: 114/76 131/78    Pulse: 90 90 85   Resp: 15 18    Temp:  (!) 97.3 F (36.3 C)    TempSrc:  Oral    SpO2: 98% 95%  96%  Weight:      Height:       Weight change: -0.907 kg  Intake/Output Summary (Last 24 hours) at 06/14/2019 1035 Last data filed at 06/14/2019 0909 Gross per 24 hour  Intake 760 ml  Output 3800 ml  Net -3040 ml       Labs: Basic Metabolic Panel: Recent Labs  Lab 06/11/19 1500 06/12/19 0911 06/13/19 0554 06/14/19 0258  NA 137 137 135 136  K 3.1* 5.3* 4.2 4.0   CL 89* 91* 88* 89*  CO2 30 32 31 32  GLUCOSE 290* 256* 158* 240*  BUN 67* 74* 82* 87*  CREATININE 2.77* 2.91* 3.09* 3.37*  CALCIUM 6.0* 6.9* 7.1* 7.8*  PHOS 2.4* 2.2* 2.2*  --    Liver Function Tests: Recent Labs  Lab 06/11/19 1500 06/12/19 0911 06/13/19 0554  ALBUMIN 2.0* 2.1* 2.0*   No results for input(s): LIPASE, AMYLASE in the last 168 hours. No results for input(s): AMMONIA in the last 168 hours. CBC: Recent Labs  Lab 06/10/19 0349 06/13/19 0554 06/14/19 0258  WBC 8.0 8.9 9.4  NEUTROABS 6.1  --   --   HGB 11.4* 10.3* 10.8*  HCT 35.2* 32.5* 33.7*  MCV 96.2 96.2 97.4  PLT 147* 90* 106*   Cardiac Enzymes: No results for input(s): CKTOTAL, CKMB, CKMBINDEX, TROPONINI in the last 168 hours. CBG: Recent Labs  Lab 06/13/19 0634 06/13/19 1143 06/13/19 1651 06/13/19 2050 06/14/19 0625  GLUCAP 138* 353* 358* 281* 285*    Iron Studies: No results for input(s): IRON, TIBC, TRANSFERRIN, FERRITIN in the last 72 hours. Studies/Results: Dg Chest Port 1 View  Result Date: 06/14/2019 CLINICAL DATA:  ARDS. EXAM: PORTABLE CHEST 1 VIEW COMPARISON:  06/11/2019 FINDINGS: Previous median sternotomy and CABG procedure. Mild cardiac enlargement. Aortic atherosclerosis. Persistent bilateral pleural effusions. Unchanged bilateral interstitial and airspace opacities. IMPRESSION: 1. No change in  aeration to the lungs compared with prior exam. 2. Persistent bilateral pleural effusions. Electronically Signed   By: Kerby Moors M.D.   On: 06/14/2019 06:13    Medications: Infusions: . sodium chloride 10 mL/hr at 06/10/19 1600    Scheduled Medications: . calcitRIOL  0.25 mcg Oral Daily  . Chlorhexidine Gluconate Cloth  6 each Topical Daily  . Gerhardt's butt cream   Topical QID  . guaiFENesin  600 mg Oral BID  . insulin aspart  0-5 Units Subcutaneous QHS  . insulin aspart  0-9 Units Subcutaneous TID WC  . insulin glargine  18 Units Subcutaneous BID  . ipratropium-albuterol  3  mL Inhalation BID  . lactobacillus acidophilus  2 tablet Oral TID  . levETIRAcetam  500 mg Oral BID  . Melatonin  6 mg Oral QHS  . metoprolol succinate  200 mg Oral Daily  . midodrine  5 mg Oral TID WC  . mometasone-formoterol  2 puff Inhalation BID  . pantoprazole  40 mg Oral Daily  . polyethylene glycol  17 g Oral Daily  . [START ON 22-Jun-2019] predniSONE  40 mg Oral Q breakfast  . rivaroxaban  15 mg Oral Q supper  . sodium chloride flush  10-40 mL Intracatheter Q12H  . sodium chloride flush  3 mL Intravenous Q12H  . tamsulosin  0.4 mg Oral Daily    have reviewed scheduled and prn medications.  Physical Exam: General: On nasal cannula, not in distress Heart:RRR, s1s2 nl, no rub Lungs: Bibasal crackles, no wheezing Abdomen:soft, Non-tender, non-distended Extremities: Trace lower extremity edema Dialysis Access: No HD catheter present GU: Foley  Cartina Brousseau B Cotton Beckley 06/14/2019,10:35 AM  LOS: 8 days

## 2019-06-14 NOTE — Progress Notes (Signed)
NAME:  Dylan Goodman, MRN:  [id number], DOB:  April 04, 1951, LOS: 55 ADMISSION DATE:  06/18/2019, CONSULTATION DATE:  10/7 REFERRING MD:  Dr Jimmye Norman [facility] EDP, CHIEF COMPLAINT:  Cardiac arrest   Brief History   68 year old male admitted with recent COVID pneumonia who was transferred from inpatient rehab due to acute respiratory distress. Originally thought to respiratory distress was secondary to CHF exacerbation but CT chest concerning for possible multifocal pneumonia therefore antibiotics were resumed.   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 Patient has had extensive hospitalizations over the last several months at several [place] facilities. Time line as follows;  -9/14-9/19 admitted to [facility] for COVID-19 pneumonia  -10/5 he presented to [facility] with complaints of chest pain. Cardiac workup was negative and he was felt to be dehydrated. He was given IVF and discharged home. -10/6 he called EMS due to dizziness, and upon their arrival he had a witnessed 2-minute tonic/clonic type seizure abated with versed 2mg . Upon arrival to ED he was awake, but confused. COVID Test again positive. -10/7 abruptly developed respiratory distress and tachycardia with the appearance of VT vs AF RVR. He lost pulses. He was defibrillated and CPR was started. Total downtime approximated at 11 mins. -10/19  CT chest done 10/19 revealing extensive patchy consolidation with ground glass opacity prominent in BUL suggestive of PNA, atelectasis or possible early post-inflammatory fibrosis. -10/21 due to lack of improvement in symptoms, decision was made to treat him again with Remdesivir and antibiotics.  He developed progressive confusion with agitation and was intubated -10/25-10/29 CRRT utilized -11/6 - 11/7 Admitted to [facility]  program for rehab but developed respiratory distress with CXR showing increased pleural effusion on left, BIPAP required due to increased WOB. Transferred back to acute care.   During this admission he has received treatment for acute on chronic respiratory failure felt secondary to multifocal pneumonia and pulmonary edema with IV antibiotics, diuretics,  and US guided thoracentesis (744ml fluid removed).   PCCM consulted 11/11 due to continued high oxygen demand.   Past Medical History  COVID pneumonia  VRE and ESBL  Seizures Persistent A-fib PAD MI Hx of CABG GERD Type 2 diabetes  Hypertension CKD stage IV Chronic systolic CHF Cardiac arrest Adrenal insufficiency   Significant Hospital Events   11/7 Admitted   Consults:  PCCM  Procedures:  Right Thoracentesis with IR  11/8 >> 711ml removed   Significant Diagnostic Tests:  CT Chest 11/8 >> mixed interstitial opacities bilaterally, small to mod effusions bilaterally. CXR 11/8 >> stable, possible small right apical PTX CXR 11/12 > stable.  L > R opacities Echo 11/7 > EF 20-25%  Micro Data:  Blood cultures 11/8 >>neg  Pleural fluid 11/8 >>neg Urine Cx: yeast  Antimicrobials:  Vancomycin 11/8 >> Cefepime 11/8 >>  Interim history/subjective:  Sats better today. Remains in good spirits. About to get up to chair. Denies any significant cough or chest pain.  Objective   Blood pressure 131/78, pulse 85, temperature (!) 97.3 F (36.3 C), temperature source Oral, resp. rate 18, height 5\' 11"  (1.803 m), weight 75.3 kg, SpO2 96 %.        Intake/Output Summary (Last 24 hours) at 06/14/2019 1014 Last data filed at 06/14/2019 0909 Gross per 24 hour  Intake 760 ml  Output 3800  ml  Net -3040 ml   Filed Weights   06/12/19 0076 06/13/19 0541 06/14/19 0145  Weight: 77.1 kg 76.2 kg 75.3 kg    Examination: GEN: chronically ill man laying in bed HEENT: MMM, trachea midline, temporal muscle wasting CV: RRR, ext warm  PULM: better air movement today, occasional accessory muscle use, bilateral crackles GI: Soft, +BS EXT: no edema, + muscle wasting NEURO: moves all 4 ext to command PSYCH: AOx3, good insight SKIN: scattered bruising  BMP reviewed, looks okay, uremia and Cr slowly climbing with diuresis CBC showing plts bumped back up CXR looks about the same L > R airspace disease  Assessment & Plan:   # Acute on chronic hypoxemic respiratory failure due to volume overloaded state of heart and 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  Not really improving but diuresing well. # Not really seeing any signs of infection but Pct up # Pleural effusions related to volume overload and protein calorie malnutrition # Various electrolyte disturbances improved # Advanced CKD # Afib on amio  P:  -Not sure how much more fluid he has left to give, defer to nephrology -Switch solumedrol to prednisone -Cefepime x 7 days fine -Up to chair -Continue low dose amiodarone for now -Would do PAP at night and PRN -Dropped O2 from 15L to 10L, he is maintaining sats > 90%, hopefully this is start of a trend -Palliative care consult reasonable but not sure he is on board with switching GoC at present  Rest per primary team   Erskine Emery MD PCCM

## 2019-06-14 NOTE — Plan of Care (Signed)

## 2019-06-14 NOTE — Plan of Care (Signed)

## 2019-06-15 ENCOUNTER — Inpatient Hospital Stay (HOSPITAL_COMMUNITY): Payer: Medicare Other

## 2019-06-15 DIAGNOSIS — A419 Sepsis, unspecified organism: Secondary | ICD-10-CM

## 2019-06-15 DIAGNOSIS — J80 Acute respiratory distress syndrome: Secondary | ICD-10-CM

## 2019-06-15 LAB — POCT I-STAT 7, (LYTES, BLD GAS, ICA,H+H)
Acid-Base Excess: 3 mmol/L — ABNORMAL HIGH (ref 0.0–2.0)
Bicarbonate: 30.2 mmol/L — ABNORMAL HIGH (ref 20.0–28.0)
Calcium, Ion: 1.1 mmol/L — ABNORMAL LOW (ref 1.15–1.40)
HCT: 42 % (ref 39.0–52.0)
Hemoglobin: 14.3 g/dL (ref 13.0–17.0)
O2 Saturation: 82 %
Patient temperature: 98.7
Potassium: 4 mmol/L (ref 3.5–5.1)
Sodium: 134 mmol/L — ABNORMAL LOW (ref 135–145)
TCO2: 32 mmol/L (ref 22–32)
pCO2 arterial: 53.4 mmHg — ABNORMAL HIGH (ref 32.0–48.0)
pH, Arterial: 7.36 (ref 7.350–7.450)
pO2, Arterial: 49 mmHg — ABNORMAL LOW (ref 83.0–108.0)

## 2019-06-15 LAB — BASIC METABOLIC PANEL
Anion gap: 14 (ref 5–15)
BUN: 89 mg/dL — ABNORMAL HIGH (ref 8–23)
CO2: 32 mmol/L (ref 22–32)
Calcium: 8.4 mg/dL — ABNORMAL LOW (ref 8.9–10.3)
Chloride: 92 mmol/L — ABNORMAL LOW (ref 98–111)
Creatinine, Ser: 3.06 mg/dL — ABNORMAL HIGH (ref 0.61–1.24)
GFR calc Af Amer: 24 mL/min — ABNORMAL LOW (ref >60)
GFR calc non Af Amer: 20 mL/min — ABNORMAL LOW (ref >60)
Glucose, Bld: 45 mg/dL — ABNORMAL LOW (ref 70–99)
Potassium: 3.5 mmol/L (ref 3.5–5.1)
Sodium: 138 mmol/L (ref 135–145)

## 2019-06-15 LAB — CBC
HCT: 40.2 % (ref 39.0–52.0)
Hemoglobin: 12.7 g/dL — ABNORMAL LOW (ref 13.0–17.0)
MCH: 30.8 pg (ref 26.0–34.0)
MCHC: 31.6 g/dL (ref 30.0–36.0)
MCV: 97.3 fL (ref 80.0–100.0)
Platelets: 184 10*3/uL (ref 150–400)
RBC: 4.13 MIL/uL — ABNORMAL LOW (ref 4.22–5.81)
RDW: 18 % — ABNORMAL HIGH (ref 11.5–15.5)
WBC: 11.2 10*3/uL — ABNORMAL HIGH (ref 4.0–10.5)
nRBC: 0 % (ref 0.0–0.2)

## 2019-06-15 LAB — BLOOD GAS, ARTERIAL
Acid-Base Excess: 6.9 mmol/L — ABNORMAL HIGH (ref 0.0–2.0)
Bicarbonate: 31.2 mmol/L — ABNORMAL HIGH (ref 20.0–28.0)
Drawn by: 51931
FIO2: 100
O2 Saturation: 74.7 %
Patient temperature: 37
pCO2 arterial: 46.8 mmHg (ref 32.0–48.0)
pH, Arterial: 7.439 (ref 7.350–7.450)
pO2, Arterial: 43.1 mmHg — ABNORMAL LOW (ref 83.0–108.0)

## 2019-06-15 LAB — GLUCOSE, CAPILLARY
Glucose-Capillary: 249 mg/dL — ABNORMAL HIGH (ref 70–99)
Glucose-Capillary: 46 mg/dL — ABNORMAL LOW (ref 70–99)
Glucose-Capillary: 97 mg/dL (ref 70–99)

## 2019-06-15 LAB — MRSA PCR SCREENING: MRSA by PCR: NEGATIVE

## 2019-06-15 LAB — MAGNESIUM: Magnesium: 1.2 mg/dL — ABNORMAL LOW (ref 1.7–2.4)

## 2019-06-15 IMAGING — DX DG CHEST 1V PORT
1 series · 1 of 1 positions shown · non-contrast
Comparison: [DATE]

CLINICAL DATA: Oxygen desaturation.

EXAM:
PORTABLE CHEST 1 VIEW

[chest ap]
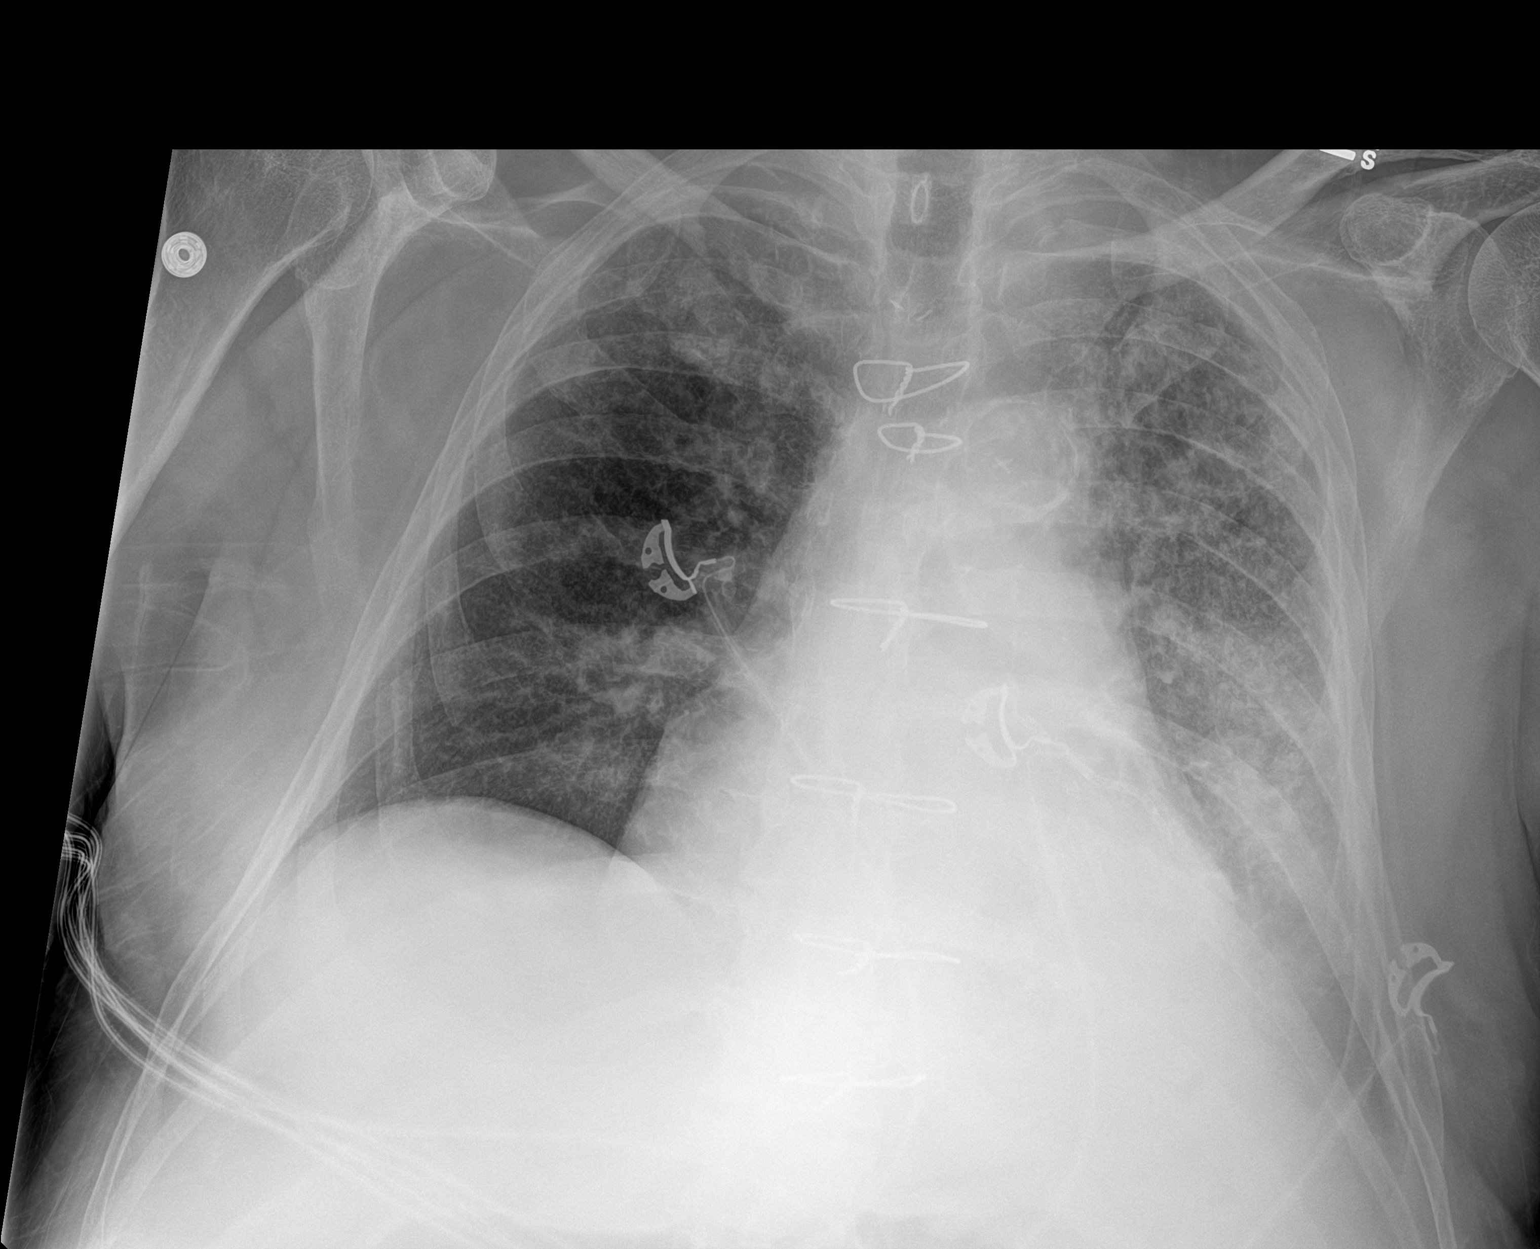

[1 of 1 positions shown; findings below may reference images not displayed]

FINDINGS: Sequelae of CABG are again identified. The cardiac silhouette
remains mildly enlarged. Aortic atherosclerosis is noted. There are
persistent interstitial and airspace opacities bilaterally,
currently greater on the left than the right with a persistent small
left pleural effusion. Right lung aeration appears slightly improved
while left lung opacities have increased. No pneumothorax is
identified.
IMPRESSION: 1. Persistent bilateral airspace disease with interval worsening on
the left and slight improvement on the right.
2. Left pleural effusion.

## 2019-06-15 IMAGING — DX DG CHEST 1V PORT
1 series · 1 of 1 positions shown · non-contrast
Comparison: None.

CLINICAL DATA: Respiratory failure

EXAM:
PORTABLE CHEST 1 VIEW

[chest ap]
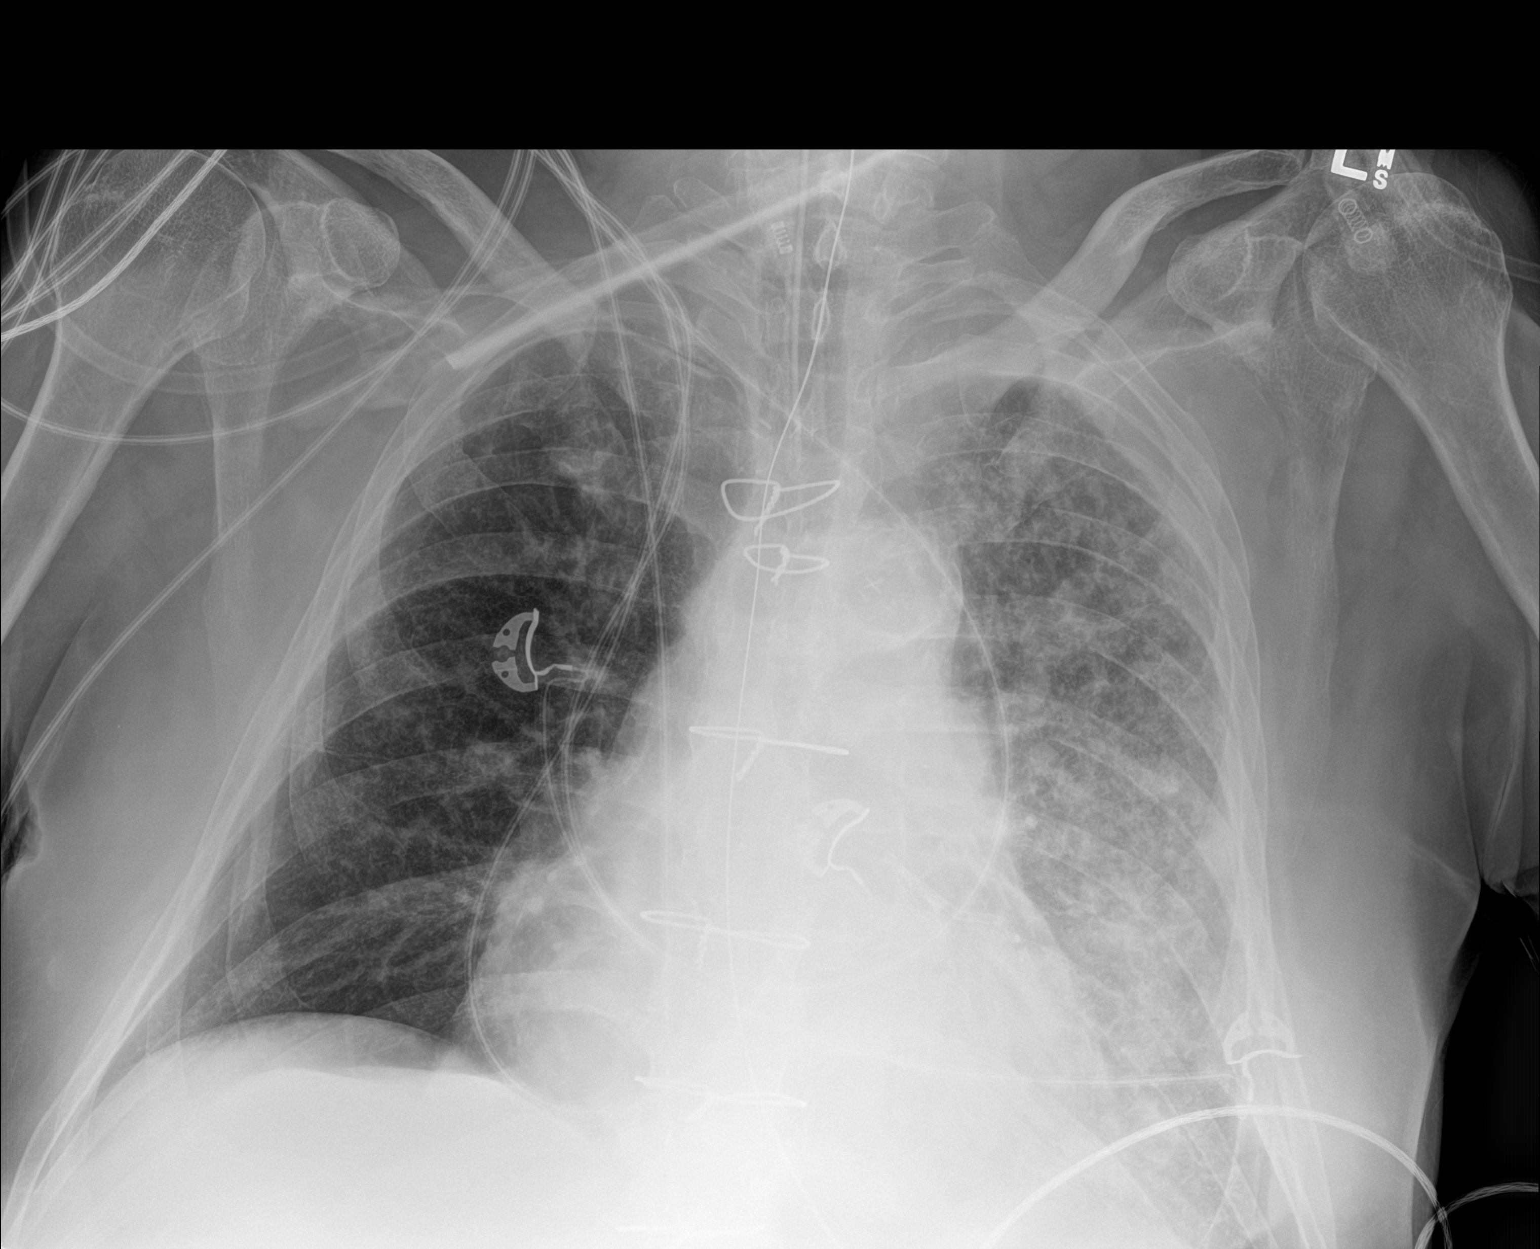

[1 of 1 positions shown; findings below may reference images not displayed]

FINDINGS: Endotracheal tube is approximately 4 cm from the carina. Enteric
tube passes below the diaphragm with tip out of field of view.

Persistent ill-defined left much greater than right opacities.
Probable similar left pleural effusion. Stable cardiomediastinal
contours.
IMPRESSION: Lines and tubes as above.

Similar appearance of left much greater than right opacities and
left pleural effusion.

## 2019-06-15 IMAGING — DX DG ABDOMEN 1V
1 series · 1 of 1 positions shown · non-contrast
Comparison: Abdominal radiograph dated [DATE].

CLINICAL DATA: 67-year-old male with NG placement.

EXAM:
ABDOMEN - 1 VIEW

[abdomen kub]
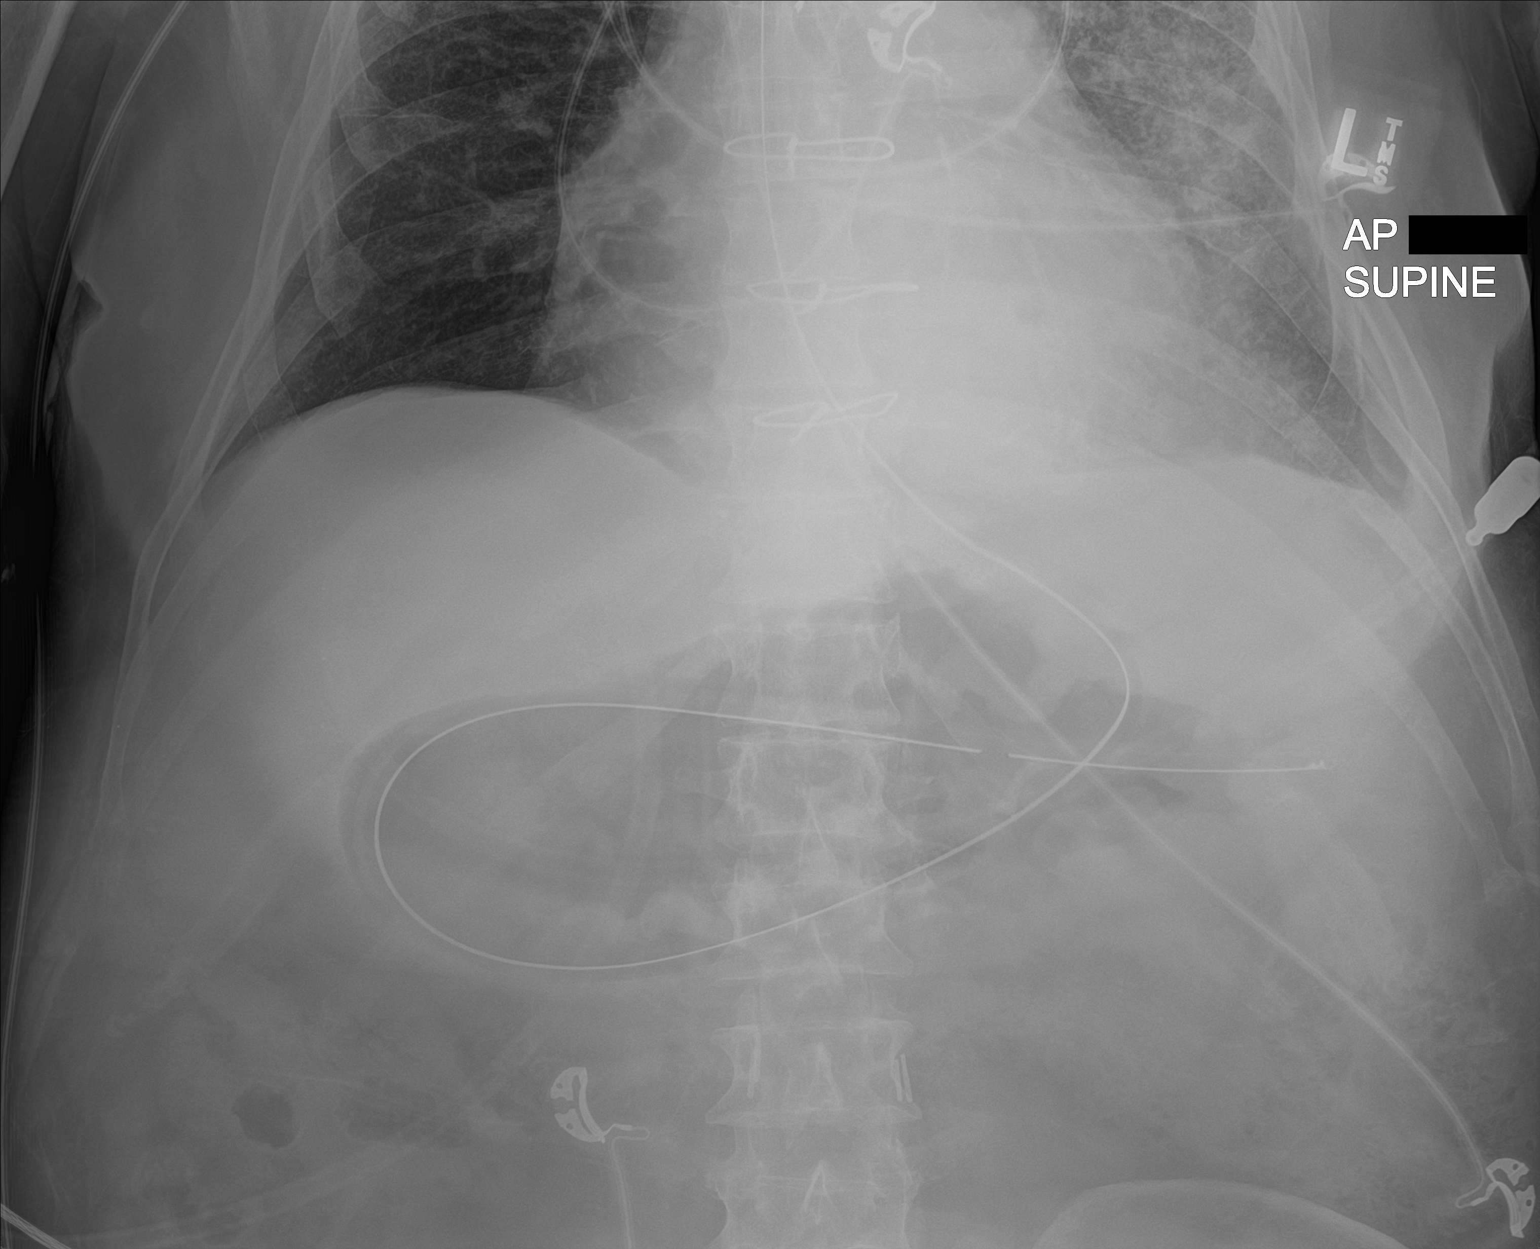

[1 of 1 positions shown; findings below may reference images not displayed]

FINDINGS: Partially visualized enteric tube extends into the distal stomach
and loops back with tip and side-port in the proximal stomach. The
tube can be retracted by approximately 20 cm for optimal
positioning.

Left lung base interstitial coarsening or densities noted. Median
sternotomy wires and coronary vascular calcification.
IMPRESSION: Enteric tube in the stomach as described. The tube can be retracted
for optimal positioning.

## 2019-06-15 MED ORDER — DOXAZOSIN MESYLATE 4 MG PO TABS
4.0000 mg | ORAL_TABLET | Freq: Every day | ORAL | Status: DC
  Filled 2019-06-15: qty 1

## 2019-06-15 MED ORDER — CHLORHEXIDINE GLUCONATE 0.12% ORAL RINSE (MEDLINE KIT): 15.0000 mL | Freq: Two times a day (BID) | OROMUCOSAL | Status: DC

## 2019-06-15 MED ORDER — SUCCINYLCHOLINE CHLORIDE 200 MG/10ML IV SOSY
PREFILLED_SYRINGE | INTRAVENOUS | Status: AC
  Filled 2019-06-15: qty 10

## 2019-06-15 MED ORDER — PROPOFOL 10 MG/ML IV BOLUS
INTRAVENOUS | Status: AC
  Filled 2019-06-15: qty 20

## 2019-06-15 MED ORDER — SODIUM CHLORIDE 0.9 % IV SOLN
250.0000 mL | INTRAVENOUS | Status: DC
  Administered 2019-06-15: 250 mL via INTRAVENOUS

## 2019-06-15 MED ORDER — VASOPRESSIN 20 UNIT/ML IV SOLN
0.0300 [IU]/min | INTRAVENOUS | Status: DC
  Filled 2019-06-15: qty 2

## 2019-06-15 MED ORDER — MIDAZOLAM HCL 2 MG/2ML IJ SOLN
INTRAMUSCULAR | Status: AC
  Filled 2019-06-15: qty 2

## 2019-06-15 MED ORDER — STERILE WATER FOR INJECTION IJ SOLN
INTRAMUSCULAR | Status: AC
  Filled 2019-06-15: qty 10

## 2019-06-15 MED ORDER — FENTANYL CITRATE (PF) 100 MCG/2ML IJ SOLN
INTRAMUSCULAR | Status: AC
  Administered 2019-06-15: 50 ug
  Filled 2019-06-15: qty 2

## 2019-06-15 MED ORDER — ORAL CARE MOUTH RINSE: 15.0000 mL | OROMUCOSAL | Status: DC

## 2019-06-15 MED ORDER — SODIUM CHLORIDE 0.9 % IV BOLUS
250.0000 mL | Freq: Once | INTRAVENOUS | Status: AC
  Administered 2019-06-15: 250 mL via INTRAVENOUS

## 2019-06-15 MED ORDER — RIVAROXABAN 15 MG PO TABS: 15.0000 mg | ORAL_TABLET | Freq: Every day | ORAL | Status: DC

## 2019-06-15 MED ORDER — NOREPINEPHRINE 4 MG/250ML-% IV SOLN
2.0000 ug/min | INTRAVENOUS | Status: DC
  Administered 2019-06-15: 2 ug/min via INTRAVENOUS

## 2019-06-15 MED ORDER — IPRATROPIUM-ALBUTEROL 0.5-2.5 (3) MG/3ML IN SOLN: 3.0000 mL | Freq: Four times a day (QID) | RESPIRATORY_TRACT | Status: DC

## 2019-06-15 MED ORDER — INSULIN GLARGINE 100 UNIT/ML ~~LOC~~ SOLN
15.0000 [IU] | Freq: Two times a day (BID) | SUBCUTANEOUS | Status: DC
  Filled 2019-06-15 (×2): qty 0.15

## 2019-06-15 MED ORDER — CALCITRIOL 0.25 MCG PO CAPS
0.2500 ug | ORAL_CAPSULE | Freq: Every day | ORAL | Status: DC
  Administered 2019-06-15: 12:00:00 0.25 ug
  Filled 2019-06-15: qty 1

## 2019-06-15 MED ORDER — EPINEPHRINE 1 MG/10ML IJ SOSY
PREFILLED_SYRINGE | INTRAMUSCULAR | Status: AC
  Administered 2019-06-15: 0.5 mg
  Filled 2019-06-15: qty 10

## 2019-06-15 MED ORDER — BUDESONIDE 0.5 MG/2ML IN SUSP: 0.5000 mg | Freq: Two times a day (BID) | RESPIRATORY_TRACT | Status: DC

## 2019-06-15 MED ORDER — MIDAZOLAM HCL 2 MG/2ML IJ SOLN
1.0000 mg | INTRAMUSCULAR | Status: DC | PRN
  Administered 2019-06-15: 1 mg via INTRAVENOUS

## 2019-06-15 MED ORDER — SODIUM CHLORIDE 0.9 % IV SOLN
2.0000 g | INTRAVENOUS | Status: DC
  Filled 2019-06-15: qty 2

## 2019-06-15 MED ORDER — METHYLPREDNISOLONE SODIUM SUCC 40 MG IJ SOLR
40.0000 mg | Freq: Two times a day (BID) | INTRAMUSCULAR | Status: DC
  Administered 2019-06-15: 40 mg via INTRAVENOUS
  Filled 2019-06-15: qty 1

## 2019-06-15 MED ORDER — VANCOMYCIN HCL 10 G IV SOLR: 1250.0000 mg | INTRAVENOUS | Status: DC

## 2019-06-15 MED ORDER — ROCURONIUM BROMIDE 10 MG/ML (PF) SYRINGE
PREFILLED_SYRINGE | INTRAVENOUS | Status: AC
  Administered 2019-06-15: 60 mg
  Filled 2019-06-15: qty 10

## 2019-06-15 MED ORDER — LEVETIRACETAM 500 MG PO TABS: 500.0000 mg | ORAL_TABLET | Freq: Two times a day (BID) | ORAL | Status: DC

## 2019-06-15 MED ORDER — ETOMIDATE 2 MG/ML IV SOLN
INTRAVENOUS | Status: AC
  Filled 2019-06-15: qty 20

## 2019-06-15 MED ORDER — LACTATED RINGERS IV SOLN
INTRAVENOUS | Status: DC
  Administered 2019-06-15: 12:00:00 via INTRAVENOUS

## 2019-06-15 MED ORDER — MELATONIN 3 MG PO TABS
6.0000 mg | ORAL_TABLET | Freq: Every day | ORAL | Status: DC
  Filled 2019-06-15: qty 2

## 2019-06-15 MED ORDER — ATROPINE SULFATE 1 MG/10ML IJ SOSY
PREFILLED_SYRINGE | INTRAMUSCULAR | Status: AC
  Administered 2019-06-15: 1 mg
  Filled 2019-06-15: qty 10

## 2019-06-15 MED ORDER — ROCURONIUM BROMIDE 50 MG/5ML IV SOLN
60.0000 mg | Freq: Once | INTRAVENOUS | Status: AC
  Filled 2019-06-15: qty 6

## 2019-06-15 MED ORDER — ETOMIDATE 2 MG/ML IV SOLN
20.0000 mg | Freq: Once | INTRAVENOUS | Status: AC
  Administered 2019-06-15: 20 mg via INTRAVENOUS

## 2019-06-15 MED ORDER — PANTOPRAZOLE SODIUM 40 MG PO PACK
40.0000 mg | PACK | Freq: Every day | ORAL | Status: DC
  Administered 2019-06-15: 40 mg
  Filled 2019-06-15: qty 20

## 2019-06-15 MED ORDER — MIDAZOLAM HCL 2 MG/2ML IJ SOLN
1.0000 mg | Freq: Once | INTRAMUSCULAR | Status: AC
  Administered 2019-06-15: 1 mg via INTRAVENOUS

## 2019-06-15 MED ORDER — FENTANYL CITRATE (PF) 100 MCG/2ML IJ SOLN: 50.0000 ug | Freq: Once | INTRAMUSCULAR | Status: AC

## 2019-06-15 MED ORDER — BACID PO TABS
2.0000 | ORAL_TABLET | Freq: Three times a day (TID) | ORAL | Status: DC
  Administered 2019-06-15: 2
  Filled 2019-06-15 (×3): qty 2

## 2019-06-15 MED ORDER — FENTANYL CITRATE (PF) 100 MCG/2ML IJ SOLN: 25.0000 ug | INTRAMUSCULAR | Status: DC | PRN

## 2019-06-15 MED ORDER — MIDODRINE HCL 5 MG PO TABS: 5.0000 mg | ORAL_TABLET | Freq: Three times a day (TID) | ORAL | Status: DC

## 2019-06-15 MED ORDER — SODIUM CHLORIDE 0.9 % IV SOLN
1.0000 g | Freq: Two times a day (BID) | INTRAVENOUS | Status: DC
  Filled 2019-06-15 (×2): qty 1

## 2019-06-15 MED ORDER — VANCOMYCIN HCL 10 G IV SOLR
1500.0000 mg | Freq: Once | INTRAVENOUS | Status: DC
  Filled 2019-06-15 (×2): qty 1500

## 2019-06-15 MED ORDER — VECURONIUM BROMIDE 10 MG IV SOLR
INTRAVENOUS | Status: AC
  Filled 2019-06-15: qty 10

## 2019-06-15 MED ORDER — MAGNESIUM SULFATE 2 GM/50ML IV SOLN
2.0000 g | Freq: Once | INTRAVENOUS | Status: AC
  Administered 2019-06-15: 2 g via INTRAVENOUS
  Filled 2019-06-15: qty 50

## 2019-06-15 MED ORDER — FENTANYL CITRATE (PF) 100 MCG/2ML IJ SOLN
25.0000 ug | INTRAMUSCULAR | Status: DC | PRN
  Filled 2019-06-15: qty 2

## 2019-06-15 MED ORDER — MIDAZOLAM HCL 2 MG/2ML IJ SOLN
1.0000 mg | INTRAMUSCULAR | Status: DC | PRN
  Filled 2019-06-15: qty 2

## 2019-06-15 MED ORDER — MAGNESIUM SULFATE 2 GM/50ML IV SOLN
INTRAVENOUS | Status: AC
  Filled 2019-06-15: qty 50

## 2019-06-15 MED ORDER — DEXTROSE 50 % IV SOLN
INTRAVENOUS | Status: AC
  Administered 2019-06-15: 25 mL
  Filled 2019-06-15: qty 50

## 2019-06-15 MED ORDER — INSULIN ASPART 100 UNIT/ML ~~LOC~~ SOLN: 0.0000 [IU] | SUBCUTANEOUS | Status: DC

## 2019-06-16 LAB — GLUCOSE, CAPILLARY: Glucose-Capillary: 96 mg/dL (ref 70–99)

## 2019-06-17 ENCOUNTER — Telehealth: Payer: Self-pay

## 2019-06-17 LAB — CULTURE, RESPIRATORY W GRAM STAIN

## 2019-06-17 NOTE — Telephone Encounter (Signed)
Received dc from [facility].  DC is for cremation and a patient of Doctor Tamala Julian..   DC will be taken to Pulmonary Unit for signature.   On 06/18/2019 Received the dc back from Doctor Wert. I called the funeral home to let them know the dc is ready for pickup.

## 2019-06-20 LAB — CULTURE, BLOOD (ROUTINE X 2)
Culture: NO GROWTH
Culture: NO GROWTH

## 2019-06-30 NOTE — Procedures (Signed)
Intubation Procedure Note Dylan Goodman [id number] 06-06-1951  Procedure: Intubation Indications: Respiratory insufficiency  Procedure Details Consent: Risks of procedure as well as the alternatives and risks of each were explained to the (patient/caregiver).  Consent for procedure obtained. Time Out: Verified patient identification, verified procedure, site/side was marked, verified correct patient position, special equipment/implants available, medications/allergies/relevent history reviewed, required imaging and test results available.  Performed  Maximum sterile technique was used including antiseptics, cap, gloves, hand hygiene and mask.  MAC and 4 Glide scope. 4 w/ 8 ett   Evaluation Hemodynamic Status: Transient hypotension treated with pressors; O2 sats: stable throughout Patient's Current Condition: stable Complications: No apparent complications Patient did tolerate procedure well. Chest X-ray ordered to verify placement.  CXR: pending.   [name] 10-Jul-2019  Erick Colace ACNP-BC [place] Pager # [phone] OR # [phone] if no answer

## 2019-06-30 NOTE — Progress Notes (Addendum)
   11-Jul-2019 0917  Vitals  Temp (!) 96.1 F (35.6 C)  Temp Source Rectal  BP (!) 75/54  MAP (mmHg) (!) 63  BP Location Left Arm  BP Method Automatic  Patient Position (if appropriate) Lying  Pulse Rate 83  Pulse Rate Source Monitor  ECG Heart Rate 91  Cardiac Rhythm Atrial flutter  Resp (!) 25  Oxygen Therapy  SpO2 (!) 85 %  O2 Device Bi-PAP  FiO2 (%) 100 %  Pre-WUA / WUA Start  [place] Agitation Sedation Scale (RASS) -1  RASS Goal 0  Pain Assessment  Pain Scale 0-10  Pain Score 0  Glasgow Coma Scale  Eye Opening 3  Best Verbal Response (NON-intubated) 5  Best Motor Response 6  Glasgow Coma Scale Score 14  MEWS Score  MEWS RR 1  MEWS Pulse 0  MEWS Systolic 2  MEWS LOC 0  MEWS Temp 1  MEWS Score 4  MEWS Score Color Red  paged Pete with CCM, ns 250cc bolus ordered per dr Doristine Bosworth and infusing.

## 2019-06-30 NOTE — Progress Notes (Signed)
Responded to call from Mr. Paar nurse stating he had just died and asking for chaplain presence with Mrs. Matters.  Established a relationship of care and concern for Mrs. Kutner who spoke of the joy they had in their short 78-month marriage.  I offered a prayer aloud for the repose of the soul for Mr. Sipos and the commendation of his soul.  Also prayed for God's abiding presence with Mrs. Harshfield as she grieves.  Mrs. Ducre daughter, Delana Meyer, is on her way here to be with her mother so I gave her name to the front desk so she might be able to attend her mother at her stepfather's bedside.  De Burrs Chaplain Resident

## 2019-06-30 NOTE — Progress Notes (Signed)
Pt eating breakfast, pt alert and oriented, complaint of feeling cold, unable to get temperature orally. Warm blankets applied.

## 2019-06-30 NOTE — Progress Notes (Addendum)
Pt in refractory shock since transfer to ICU.  Was placing CVL for pressor access. Shortly after repositioned flat his HR dropped quickly from 110 to 60s. We pushed atropine followed by epinephrine. He was already on 100% FIO2. We raised his head up once again. In respect to our discussion earlier we did not initiate chest compressions. In spite of the interventions above he rapidly progressed into PEA arrest and then asystole. His wife was called to the room   Erick Colace ACNP-BC [place] Pager # [phone] OR # [phone] if no answer

## 2019-06-30 NOTE — Discharge Summary (Signed)
Date of Admission: 06/02/2019 Date of Death: 2019-06-30 Diagnoses: Post COVID Interstitial Disease Flare Hospital Course: Patient admitted with increasing oxygen requirements, infiltrates refractory to antibiotics, diuretics, and steroids.  This is in the setting of what appears to be a post-COVID recurrent ARDS.  Unfortunately despite aggressive care, patient succumbed to his illness and passed away.  Erskine Emery MD PCCM

## 2019-06-30 NOTE — Progress Notes (Signed)
Pts SpO2 continues to be in high 70s, low 80s, despite FiO2 of 100 on vent.  Spoke w/ RT who tried recruiting w/ no change.  Spoke w/ CCM NP to relay all.  Plan to place ARDS protocol, increase PEEP.

## 2019-06-30 NOTE — Progress Notes (Signed)
Called wife to make aware of husband condition and transfer with potential intubation.

## 2019-06-30 NOTE — Progress Notes (Signed)
Pt now maxed on levo through PIV, continues to be hypotensive.  SpO2 continues to be in 80s.  Spoke w/ CCM NP to relay. Got consent from pts wife for CVC.

## 2019-06-30 NOTE — Plan of Care (Signed)
  Problem: Education: Goal: Knowledge of General Education information will improve Description: Including pain rating scale, medication(s)/side effects and non-pharmacologic comfort measures Outcome: Progressing   Problem: Pain Managment: Goal: General experience of comfort will improve Outcome: Progressing   Problem: Safety: Goal: Ability to remain free from injury will improve Outcome: Progressing  Transferred to ICU for intubation

## 2019-06-30 NOTE — Progress Notes (Signed)
Received a page from RN that patient is cold and clammy, hypotensive and more hypoxic now 100% nonrebreather.  Attended patient at the bedside right away.  PCCM team had already evaluated him and they were at the bedside as well.  On examination, patient was significantly lethargic, on nonrebreather, cold and clammy in the lower extremities with crackles on the lung exam as well as rhonchi bilaterally.  Nursing staff was in the process of transferring him to ICU.  Per my discussion with PCCM NP, they will take over as primary attending from here on and they are likely going to intubate the patient.  We appreciate PCCM help.  TR[facility] will assume care once patient is a stable and transferred to medical floor.

## 2019-06-30 NOTE — Progress Notes (Signed)
Goals of care  I had a frank discussion w/ Dylan Goodman. He knows he is critically ill and chances of recovery are poor. That being said he would like short term intubation to see if treating this as HCAP may turn things around.   -he would NOT want CPR should he suffer cardiac arrest in spite of these measures -he is ok with vasoactive gtts -he does NOT want trach or long term vent. We have agreed to take this day by day. If he cannot wean then we would be looking at a palliative-one-way extubation if we determine after treatment he cannot recover.   Erick Colace ACNP-BC [place] Pager # [phone] OR # [phone] if no answer

## 2019-06-30 NOTE — Progress Notes (Signed)
RT NOTES: Removed patient from bipap and placed and 15L [place] HFNC

## 2019-06-30 NOTE — Progress Notes (Signed)
PT Cancellation and Discharge Note  Patient Details Name: Dylan Goodman MRN: [id number] DOB: 1950/12/11   Cancelled Treatment:    Reason Eval/Treat Not Completed: Medical issues which prohibited therapy Pt moved to ICU due to respiratory distress and sepsis. Pt intubated and on bedrest. PT order cancelled. Please reorder if needed.    Earney Navy, PTA Acute Rehabilitation Services Pager: [phone] Office: [phone]   2019-07-12, 10:26 AM

## 2019-06-30 NOTE — Progress Notes (Signed)
Pharmacy Antibiotic Note  Dylan Goodman is a 68 y.o. male admitted on 06/04/2019 CIR with respiratory distress. Pharmacy has been consulted for vancomycin and cefepime dosing for HCAP.  Currently on 5 days of vancomycin (last dose on 11/12) and cefepime (last dose was 11/14). WBC up to 11.2, afebrile. Scr 3.06 (CrCl 24 mL/min).  Previous cx showing no growth to date.   Plan: Start vancomycin 1500 mg IV once then 1250 mg IV every 48 hours (estAUC 474) Start cefepime 2g IV every 24 hours Monitor renal fx, cx results, clinical pic, and vanc levels as appropriate   Height: 5\' 11"  (180.3 cm) Weight: 157 lb (71.2 kg) IBW/kg (Calculated) : 75.3  Temp (24hrs), Avg:97.6 F (36.4 C), Min:97.5 F (36.4 C), Max:97.7 F (36.5 C)  Recent Labs  Lab 06/10/19 0349  06/11/19 1200 06/11/19 1500 06/12/19 0911 06/13/19 0554 06/14/19 0258 Jul 14, 2019 0500  WBC 8.0  --   --   --   --  8.9 9.4 11.2*  CREATININE 2.78*   < >  --  2.77* 2.91* 3.09* 3.37* 3.06*  VANCOPEAK  --   --  56*  --   --   --   --   --    < > = values in this interval not displayed.    Estimated Creatinine Clearance: 24.2 mL/min (A) (by C-G formula based on SCr of 3.06 mg/dL (H)).    No Known Allergies  Antonietta Jewel, PharmD, BCCCP Clinical Pharmacist  Phone: [phone]  Please check AMION for all [place] phone numbers After 10:00 PM, call [place] [phone] 07/14/2019 9:32 AM

## 2019-06-30 NOTE — Progress Notes (Signed)
Attempted to get another temperature unable to get temperature, pt shaking and c/o being cold, paged MD, applied bair hugger on pt. Reapplied on BIPAP as sats dropping to 68%. Paged rapid response, and respiratory therapist. New orders rec'd to get ABG.

## 2019-06-30 NOTE — Consult Note (Signed)
[place] Nurse Consult Note: Reason for Consult: Consult requested for buttocks.  Reviewed photo and progress notes in the EMR; pt previously had moisture associated skin damage and patchy areas of partial thickness skin loss on 11/6.  Assessed bilat buttocks today; previous partial thickness wounds have evolved into full thickness wounds related to moisture to left buttock. Location and appearance are not consistent with pressure injuries. Bedside nurse reports patient has been frequently incontinent of stool and it is difficult to keep the location from becoming soiled. Pt was previously using barrier cream to protect the affected areas. Wound type: 3 full thickness wounds to left lower buttock: 2X4X.2cm .5X.5X.2cm .8X.8X.2cm Wound bed: Red and moist Drainage (amount, consistency, odor) mod amt tan drainage Periwound: red macerated skin surrounding Dressing procedure/placement/frequency:  Topical treatment orders provided for bedside nurses to perform daily as follows: Aquacel to provide antimicrobial benefits and absorb drainage. Foam dressing to protect from further injury.  Pt is on a low air loss mattress to reduce pressure. Please re-consult if further assistance is needed.  Thank-you,  Julien Girt MSN, [place], [place], [place], [place]

## 2019-06-30 NOTE — Progress Notes (Signed)
   06-28-2019 0926  Vitals  BP 109/67  MAP (mmHg) 82  ECG Heart Rate (!) 102  Resp (!) 22  MEWS Score  MEWS RR 1  MEWS Pulse 1  MEWS Systolic 0  MEWS LOC 0  MEWS Temp 1  MEWS Score 3  MEWS Score Color Yellow  transfering to 3M03 with RR, RT, bedside nurse.

## 2019-06-30 NOTE — Progress Notes (Signed)
OT Cancellation Note and Discharge  Patient Details Name: [name] MRN: [id number] DOB: 1950-08-24   Cancelled Treatment:    Reason Eval/Treat Not Completed: Medical issues which prohibited therapy. Pt moved to ICU for sepsis and respiratory distress. Orders D/c'd.   [place], OTR/L Acute Rehab Services Pager [phone] Office [phone]     Almon Register July 12, 2019, 10:22 AM

## 2019-06-30 NOTE — Progress Notes (Signed)
Time of death 5.  Pronounced by Marni Griffon, CCM NP and this RN.  Pts wife at bedside. Chaplain called.

## 2019-06-30 NOTE — Procedures (Signed)
Central Venous Catheter Insertion Procedure Note Dylan Goodman [id number] Dec 17, 1950  Procedure: Insertion of Central Venous Catheter Indications: Assessment of intravascular volume, Drug and/or fluid administration and Frequent blood sampling  Procedure Details Consent: Risks of procedure as well as the alternatives and risks of each were explained to the (patient/caregiver).  Consent for procedure obtained. Time Out: Verified patient identification, verified procedure, site/side was marked, verified correct patient position, special equipment/implants available, medications/allergies/relevent history reviewed, required imaging and test results available.  Performed  Maximum sterile technique was used including antiseptics, cap, gloves, gown, hand hygiene, mask and sheet. Skin prep: Chlorhexidine; local anesthetic administered A antimicrobial bonded/coated triple lumen catheter was placed in the right internal jugular vein using the Seldinger technique.  Evaluation Blood flow good Complications: Complications of cardiac arrest when placed supine. did not respond to raising head or pushes of epinephrine or atropine.   Erick Colace ACNP-BC [place] Pager # [phone] OR # [phone] if no answer

## 2019-06-30 NOTE — Progress Notes (Addendum)
NAME:  Dylan Goodman, MRN:  [id number], DOB:  [birth year], LOS: 9 ADMISSION DATE:  06/29/2019, CONSULTATION DATE:  10/7 REFERRING MD:  Dr Jimmye Norman [facility] EDP, CHIEF COMPLAINT:  Cardiac arrest   Brief History   68 year old male admitted with recent COVID pneumonia who was transferred from inpatient rehab due to acute respiratory distress. Originally thought to respiratory distress was secondary to CHF exacerbation but CT chest concerning for possible multifocal pneumonia therefore antibiotics were resumed.   Sig hospital events.   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 Patient has had extensive hospitalizations over the last several months at several [facility] facilities. Time line as follows;  -9/14-9/19 admitted to [facility] for COVID-19 pneumonia  -10/5 he presented to [facility] with complaints of chest pain. Cardiac workup was negative and he was felt to be dehydrated. He was given IVF and discharged home. -10/6 he called EMS due to dizziness, and upon their arrival he had a witnessed 2-minute tonic/clonic type seizure abated with versed 2mg . Upon arrival to ED he was awake, but confused. COVID Test again positive. -10/7 abruptly developed respiratory distress and tachycardia with the appearance of VT vs AF RVR. He lost pulses. He was defibrillated and CPR was started. Total downtime approximated at 11 mins. -10/19  CT chest done 10/19 revealing extensive patchy consolidation with ground glass opacity prominent in BUL suggestive of PNA, atelectasis or possible early post-inflammatory fibrosis. -10/21 due to lack of improvement in symptoms, decision was made to treat him again with Remdesivir and antibiotics.  He developed progressive confusion with agitation and was intubated -10/25-10/29 CRRT utilized -11/6 - 11/7 Admitted to [facility] program  for rehab but developed respiratory distress with CXR showing increased pleural effusion on left, BIPAP required due to increased WOB. Transferred back to acute care.   During this admission he has received treatment for acute on chronic respiratory failure felt secondary to multifocal pneumonia and pulmonary edema with IV antibiotics, diuretics,  and US guided thoracentesis (738ml fluid removed).   PCCM consulted 11/11 due to continued high oxygen demand.  11/16 acute decompensation. DDX new HCAP w/ septic shock vs overdiuresis. Worsening hypoxia. Transferred to ICU Past Medical History  COVID pneumonia , VRE and ESBL , Seizures,Persistent A-fib PAD,MI,Hx of CABG,GERD,Type 2 diabetes ,Hypertension CKD stage IV,Chronic systolic CHF,Cardiac arrest,Adrenal insufficiency  Consults:  PCCM  Procedures:  Right Thoracentesis with IR  11/8 >> 780ml removed  oett 11/16>>>  Significant Diagnostic Tests:  CT Chest 11/8 >> mixed interstitial opacities bilaterally, small to mod effusions bilaterally. CXR 11/8 >> stable, possible small right apical PTX CXR 11/12 > stable.  L > R opacities Echo 11/7 > EF 20-25%  Micro Data:  Blood cultures 11/8 >>neg  Pleural fluid 11/8 >>neg Urine Cx: yeast Respiratory culture 11/16>>>  Antimicrobials:  Vancomycin 11/8 >>11/12 Cefepime 11/8 >>11/14 vanc 11/16>>> merrem 11/16>>>  Interim history/subjective:  Acutely worse   Objective   Blood pressure 121/72, pulse 95, temperature (Abnormal) 97.5 F (36.4 C), temperature source Oral, resp. rate (Abnormal) 22, height 5\' 11"  (1.803 m), weight 71.2 kg, SpO2 90 %.        Intake/Output Summary (Last 24 hours) at 19-Jun-2019 0920 Last data filed at June 19, 2019 0754 Gross per 24 hour  Intake 720 ml  Output 2075 ml  Net -1355 ml   Filed Weights   06/13/19  2993 06/14/19 0145 2019/06/19 0338  Weight: 76.2 kg 75.3 kg 71.2 kg    Examination: General this is a 68 yo white male. Currently lethargic on NIPPV  and hypotensive.  HENT sclera not icteric. BIPAP mask in place.  Pulm scattered rales and rhonchi. Equal chest rise Card RRR no audible murmur  abd not tender + bowel sounds no OM Ext warm no sig edema, brisk CR Neuro lethargic; initially minimally responsive. This appears to be improving   Assessment & Plan:   # Acute on chronic hypoxemic respiratory failure  In setting of presumptive HCAP superimposed on underlying COVID-induced fibrotic NSIP. PCXR personally reviewed. Left > right airspace disease. Looks acutely worse when comparing film from 11/15.  # septic shock (new 11/16); superimposed on chronic hypotension (on midodrine) # Pleural effusions related to volume overload and protein calorie malnutrition (stable) # acute metabolic encephalopathy  Systolic CM (EF 71-69%) # Various electrolyte disturbances improved # Advanced CKD stage IV(scr stable) # Afib on amio # DM ->w/ hypoglycemia on 11/16 # h/o seizure d/o # severe deconditioning  # full thickness ulcer on left buttocks   Discussion Acutely decompensated this am. CXR had been stable but no sig improvement w/ oxygenation w/ diuretics. Now looks worse. Given SIRS response, hypotension, hypothermia, have to consider Nosocomial infection/ new septic shock primary working dx;  however over-diuresis also a consideration.   Plan Transfer to ICU Volume resuscitation (stopping lasix/giving bolus now) Add norepi-->titrate for MAP > 65 Change pred back to solumedrol Will require intubation PAD protocol-->RASS goal -2 VAP bundle  Cont BDs Ck respiratory culture Broad spec abx (dasy 1 vanc/meropenem) Ok  to cont DOAC VT Cont ssi; hold lantus (episode of hypoglycemia last night) Cont keppra Replace lytes as indicated Daily dressing chang/wound care per [place]  Best practice Diet: tubefeed 11/16 Activity: bedrest VAP: ordered 11/16 Sedation protocol: ordered 11/16 Code status: full PUP: PPI DVT: xarelto   My cct 40  minutes.   Erick Colace ACNP-BC [place] Pager # [phone] OR # [phone] if no answer

## 2019-06-30 NOTE — Progress Notes (Signed)
[facility] NEPHROLOGY PROGRESS NOTE  Assessment/ Plan: Pt is a 68 y.o. yo male male with CHF, A. fib, DM, CKD with baseline creatinine around 2 admitted to [facility] from 9/14-9/19, readmitted on 10/6 status post cardiac arrest and suffered AKI on CKD required CRRT till 10/29.  #AKI on CKD, baseline creatinine around 2.  AKI likely ATN after cardiac arrest and concomitant with COVID-19 infection.  Initially treated with Lasix and then started CRRT from 10/24-10/29. - significant urine output.  Diuretics held yesterday, robust urine output, looks to be autodiuresing.  Will hold again today pending repeat CXR  #HypoMg and HypoK: Severe on 11/12, aggressively replete--> getting more Mg today  #Metabolic acidosis: resolved  #Acute hypoxic respiratory failure/pneumonia due to COVID-19 virus: Completed course of remdesivir and steroid.  Initially clinically improved, appears to be worsening- was on cefepime before- have ordered blood cultures and repeat CXR.  Rest per primary  #Acute on chronic systolic CHF exacerbation: Fluid mgmt as above.  LVEF 20 to 25%.  #Hypotension/possible adrenal insufficiency: Midodrine.  BP stable.  #Urinary retention: keep Foley in  - Dispo: doesn't appear that he is going to improve.  Agree with pall care c/s.   Subjective:   rigoring this AM  Says "my lungs aren't holding up"  Difficulty getting axillary temp  Objective Vital signs in last 24 hours: Vitals:   06-19-2019 0723 Jun 19, 2019 0724 06-19-2019 0751 19-Jun-2019 0840  BP:   99/77 121/72  Pulse:   95   Resp:   (!) 22 (!) 22  Temp:      TempSrc:      SpO2: 95% 93% 90%   Weight:      Height:       Weight change: -4.082 kg  Intake/Output Summary (Last 24 hours) at Jun 19, 2019 0914 Last data filed at 06/19/19 0754 Gross per 24 hour  Intake 720 ml  Output 2075 ml  Net -1355 ml       Labs: Basic Metabolic Panel: Recent Labs  Lab 06/11/19 1500 06/12/19 0911  06/13/19 0554 06/14/19 0258 June 19, 2019 0500  NA 137 137 135 136 138  K 3.1* 5.3* 4.2 4.0 3.5  CL 89* 91* 88* 89* 92*  CO2 30 32 31 32 32  GLUCOSE 290* 256* 158* 240* 45*  BUN 67* 74* 82* 87* 89*  CREATININE 2.77* 2.91* 3.09* 3.37* 3.06*  CALCIUM 6.0* 6.9* 7.1* 7.8* 8.4*  PHOS 2.4* 2.2* 2.2*  --   --    Liver Function Tests: Recent Labs  Lab 06/11/19 1500 06/12/19 0911 06/13/19 0554  ALBUMIN 2.0* 2.1* 2.0*   No results for input(s): LIPASE, AMYLASE in the last 168 hours. No results for input(s): AMMONIA in the last 168 hours. CBC: Recent Labs  Lab 06/10/19 0349 06/13/19 0554 06/14/19 0258 2019/06/19 0500  WBC 8.0 8.9 9.4 11.2*  NEUTROABS 6.1  --   --   --   HGB 11.4* 10.3* 10.8* 12.7*  HCT 35.2* 32.5* 33.7* 40.2  MCV 96.2 96.2 97.4 97.3  PLT 147* 90* 106* 184   Cardiac Enzymes: No results for input(s): CKTOTAL, CKMB, CKMBINDEX, TROPONINI in the last 168 hours. CBG: Recent Labs  Lab 06/14/19 1059 06/14/19 1652 06/14/19 2117 06/19/2019 0621 06/19/2019 0646  GLUCAP 360* 257* 249* 46* 97    Iron Studies: No results for input(s): IRON, TIBC, TRANSFERRIN, FERRITIN in the last 72 hours. Studies/Results: Dg Chest Port 1 View  Result Date: 06/14/2019 CLINICAL DATA:  ARDS. EXAM: PORTABLE CHEST 1 VIEW COMPARISON:  06/11/2019 FINDINGS: Previous median sternotomy and CABG procedure. Mild cardiac enlargement. Aortic atherosclerosis. Persistent bilateral pleural effusions. Unchanged bilateral interstitial and airspace opacities. IMPRESSION: 1. No change in aeration to the lungs compared with prior exam. 2. Persistent bilateral pleural effusions. Electronically Signed   By: Kerby Moors M.D.   On: 06/14/2019 06:13    Medications: Infusions: . sodium chloride 10 mL/hr at 06/10/19 1600  . magnesium sulfate bolus IVPB      Scheduled Medications: . calcitRIOL  0.25 mcg Oral Daily  . Chlorhexidine Gluconate Cloth  6 each Topical Daily  . Gerhardt's butt cream   Topical QID  .  guaiFENesin  600 mg Oral BID  . insulin aspart  0-5 Units Subcutaneous QHS  . insulin aspart  0-9 Units Subcutaneous TID WC  . insulin glargine  15 Units Subcutaneous BID  . ipratropium-albuterol  3 mL Inhalation BID  . lactobacillus acidophilus  2 tablet Oral TID  . levETIRAcetam  500 mg Oral BID  . Melatonin  6 mg Oral QHS  . metoprolol succinate  200 mg Oral Daily  . midodrine  5 mg Oral TID WC  . mometasone-formoterol  2 puff Inhalation BID  . pantoprazole  40 mg Oral Daily  . polyethylene glycol  17 g Oral Daily  . predniSONE  40 mg Oral Q breakfast  . rivaroxaban  15 mg Oral Q supper  . sodium chloride flush  10-40 mL Intracatheter Q12H  . sodium chloride flush  3 mL Intravenous Q12H  . tamsulosin  0.4 mg Oral Daily    have reviewed scheduled and prn medications.  Physical Exam: General: On nasal cannula, rigoring Heart:RRR, s1s2 nl, no rub Lungs: coarse lung sounds- not really increased WOB Abdomen:soft, Non-tender, non-distended Extremities: no lower extremity edema Dialysis Access: No HD catheter present GU: Foley  Ixchel Duck 06-16-2019,9:14 AM  LOS: 9 days

## 2019-06-30 NOTE — Significant Event (Signed)
Rapid Response Event Note  Overview:  Called at [phone] for pt with worsening resp status.Instructed to order a blood gas and I would come see pt when finished with another emergency. I was called back at 0904 stating pt not looking good and unable to get a hold of Md. I called AC to help with assessing pt while I was unavailable. SWOT RN's at bedside and assisting.   I arrived at 0914, pt on Bipap, and bair hugger. Temp 96.1, HR 91, BP 75/53, RR 25, spO2 85%. CCM at bedside and order placed to transfer to ICU.    Interventions: Bipap Bair Hugger Abg- 7.43/46/43/31 Levo ordered (did not start, BP improved. Was 111/70 before transferred) IV Team consulted for another line  Pt transferred to 3M03 with RT on Bipap.    Event ended at  [address]

## 2019-06-30 NOTE — Progress Notes (Signed)
Responded to spiritual care consult for and AD. Wife Mardene Celeste was in waiting room while procedure was being done to [place]. Mardene Celeste stated that HCPOA was already taken care of. She stated that she has had anxiety lately because of [employer]. She also said that their has been some death within the family(daoughters boyfriend passed in June, Mother passed this day 2 years back) and she feels like she is carrying a lot. I was present to offer her some pastoral support. She felt better after Doctor came into waiting room to bring her up to speed with plan of care to [facility]. I offered Mardene Celeste space to process what she is is dealing with. I gave her words of comfort, empathic listening, ministry of presence, and prayer. She requested continued spiritual care. Chaplain will follow up within the week.   Chaplain Resident  Fidel Levy  [phone]

## 2019-06-30 DEATH — deceased

## 2020-03-28 ENCOUNTER — Encounter (INDEPENDENT_AMBULATORY_CARE_PROVIDER_SITE_OTHER): Payer: Medicare Other

## 2020-03-28 ENCOUNTER — Ambulatory Visit (INDEPENDENT_AMBULATORY_CARE_PROVIDER_SITE_OTHER): Payer: Medicare Other | Admitting: Vascular Surgery
